# Patient Record
Sex: Male | Born: 1975
Health system: Southern US, Community
[De-identification: ages and names within clinical notes are randomized; demographics above are authoritative.]

## PROBLEM LIST (undated history)

## (undated) ENCOUNTER — Emergency Department: Payer: 59 | Source: Home / Self Care

## (undated) DIAGNOSIS — G4730 Sleep apnea, unspecified: Secondary | ICD-10-CM

## (undated) DIAGNOSIS — E079 Disorder of thyroid, unspecified: Secondary | ICD-10-CM

## (undated) DIAGNOSIS — D649 Anemia, unspecified: Secondary | ICD-10-CM

## (undated) DIAGNOSIS — F200 Paranoid schizophrenia: Secondary | ICD-10-CM

## (undated) DIAGNOSIS — F609 Personality disorder, unspecified: Secondary | ICD-10-CM

## (undated) DIAGNOSIS — J45909 Unspecified asthma, uncomplicated: Secondary | ICD-10-CM

## (undated) HISTORY — PX: FRACTURE SURGERY: SHX138

---

## 2000-04-16 ENCOUNTER — Inpatient Hospital Stay (HOSPITAL_COMMUNITY): Admission: EM | Admit: 2000-04-16 | Discharge: 2000-05-02 | Payer: Self-pay | Admitting: *Deleted

## 2000-06-29 ENCOUNTER — Inpatient Hospital Stay (HOSPITAL_COMMUNITY): Admission: AD | Admit: 2000-06-29 | Discharge: 2000-07-08 | Payer: Self-pay | Admitting: Psychiatry

## 2000-10-08 ENCOUNTER — Emergency Department (HOSPITAL_COMMUNITY): Admission: EM | Admit: 2000-10-08 | Discharge: 2000-10-08 | Payer: Self-pay | Admitting: Emergency Medicine

## 2001-12-18 ENCOUNTER — Emergency Department (HOSPITAL_COMMUNITY): Admission: EM | Admit: 2001-12-18 | Discharge: 2001-12-18 | Payer: Self-pay | Admitting: Emergency Medicine

## 2001-12-18 ENCOUNTER — Encounter: Payer: Self-pay | Admitting: Emergency Medicine

## 2001-12-29 ENCOUNTER — Emergency Department (HOSPITAL_COMMUNITY): Admission: EM | Admit: 2001-12-29 | Discharge: 2001-12-29 | Payer: Self-pay | Admitting: Emergency Medicine

## 2001-12-29 ENCOUNTER — Encounter: Payer: Self-pay | Admitting: Emergency Medicine

## 2004-05-15 ENCOUNTER — Ambulatory Visit: Payer: Self-pay | Admitting: Internal Medicine

## 2004-10-04 ENCOUNTER — Ambulatory Visit: Payer: Self-pay | Admitting: Internal Medicine

## 2005-01-03 ENCOUNTER — Ambulatory Visit: Payer: Self-pay | Admitting: Internal Medicine

## 2005-06-22 ENCOUNTER — Ambulatory Visit: Payer: Self-pay | Admitting: Family Medicine

## 2005-10-28 ENCOUNTER — Ambulatory Visit: Payer: Self-pay | Admitting: *Deleted

## 2005-10-28 ENCOUNTER — Inpatient Hospital Stay (HOSPITAL_COMMUNITY): Admission: AD | Admit: 2005-10-28 | Discharge: 2005-11-02 | Payer: Self-pay | Admitting: *Deleted

## 2005-12-20 ENCOUNTER — Ambulatory Visit: Payer: Self-pay | Admitting: Pulmonary Disease

## 2006-01-31 ENCOUNTER — Ambulatory Visit: Payer: Self-pay | Admitting: Internal Medicine

## 2006-01-31 ENCOUNTER — Ambulatory Visit (HOSPITAL_BASED_OUTPATIENT_CLINIC_OR_DEPARTMENT_OTHER): Admission: RE | Admit: 2006-01-31 | Discharge: 2006-01-31 | Payer: Self-pay | Admitting: Pulmonary Disease

## 2006-01-31 ENCOUNTER — Ambulatory Visit: Payer: Self-pay | Admitting: Pulmonary Disease

## 2006-03-18 ENCOUNTER — Ambulatory Visit: Payer: Self-pay | Admitting: Pulmonary Disease

## 2006-08-10 ENCOUNTER — Inpatient Hospital Stay (HOSPITAL_COMMUNITY): Admission: AD | Admit: 2006-08-10 | Discharge: 2006-08-16 | Payer: Self-pay | Admitting: *Deleted

## 2006-08-11 ENCOUNTER — Ambulatory Visit: Payer: Self-pay | Admitting: Psychiatry

## 2006-12-25 ENCOUNTER — Emergency Department (HOSPITAL_COMMUNITY): Admission: EM | Admit: 2006-12-25 | Discharge: 2006-12-25 | Payer: Self-pay | Admitting: Emergency Medicine

## 2007-04-22 ENCOUNTER — Encounter: Payer: Self-pay | Admitting: Pulmonary Disease

## 2007-07-28 ENCOUNTER — Inpatient Hospital Stay (HOSPITAL_COMMUNITY): Admission: AD | Admit: 2007-07-28 | Discharge: 2007-08-04 | Payer: Self-pay | Admitting: Psychiatry

## 2007-07-28 ENCOUNTER — Ambulatory Visit: Payer: Self-pay | Admitting: Psychiatry

## 2007-08-14 ENCOUNTER — Inpatient Hospital Stay (HOSPITAL_COMMUNITY): Admission: RE | Admit: 2007-08-14 | Discharge: 2007-08-19 | Payer: Self-pay | Admitting: Psychiatry

## 2008-09-08 ENCOUNTER — Inpatient Hospital Stay (HOSPITAL_COMMUNITY): Admission: RE | Admit: 2008-09-08 | Discharge: 2008-09-15 | Payer: Self-pay | Admitting: Psychiatry

## 2008-09-08 ENCOUNTER — Ambulatory Visit: Payer: Self-pay | Admitting: Psychiatry

## 2008-09-29 ENCOUNTER — Ambulatory Visit: Payer: Self-pay | Admitting: Pulmonary Disease

## 2008-11-15 ENCOUNTER — Ambulatory Visit (HOSPITAL_BASED_OUTPATIENT_CLINIC_OR_DEPARTMENT_OTHER): Admission: RE | Admit: 2008-11-15 | Discharge: 2008-11-15 | Payer: Self-pay | Admitting: Pulmonary Disease

## 2008-11-15 ENCOUNTER — Encounter: Payer: Self-pay | Admitting: Pulmonary Disease

## 2008-12-05 ENCOUNTER — Ambulatory Visit: Payer: Self-pay | Admitting: Pulmonary Disease

## 2008-12-06 ENCOUNTER — Encounter: Payer: Self-pay | Admitting: Pulmonary Disease

## 2009-01-08 ENCOUNTER — Emergency Department (HOSPITAL_COMMUNITY): Admission: EM | Admit: 2009-01-08 | Discharge: 2009-01-08 | Payer: Self-pay | Admitting: Emergency Medicine

## 2009-02-15 ENCOUNTER — Ambulatory Visit: Payer: Self-pay | Admitting: Psychiatry

## 2009-02-15 ENCOUNTER — Other Ambulatory Visit: Payer: Self-pay | Admitting: Emergency Medicine

## 2009-02-16 ENCOUNTER — Inpatient Hospital Stay (HOSPITAL_COMMUNITY): Admission: AD | Admit: 2009-02-16 | Discharge: 2009-03-08 | Payer: Self-pay | Admitting: Psychiatry

## 2010-03-19 LAB — GLUCOSE, CAPILLARY: Glucose-Capillary: 133 mg/dL — ABNORMAL HIGH (ref 70–99)

## 2010-03-22 LAB — GLUCOSE, CAPILLARY
Glucose-Capillary: 100 mg/dL — ABNORMAL HIGH (ref 70–99)
Glucose-Capillary: 107 mg/dL — ABNORMAL HIGH (ref 70–99)
Glucose-Capillary: 110 mg/dL — ABNORMAL HIGH (ref 70–99)
Glucose-Capillary: 111 mg/dL — ABNORMAL HIGH (ref 70–99)
Glucose-Capillary: 112 mg/dL — ABNORMAL HIGH (ref 70–99)
Glucose-Capillary: 116 mg/dL — ABNORMAL HIGH (ref 70–99)
Glucose-Capillary: 117 mg/dL — ABNORMAL HIGH (ref 70–99)
Glucose-Capillary: 117 mg/dL — ABNORMAL HIGH (ref 70–99)
Glucose-Capillary: 119 mg/dL — ABNORMAL HIGH (ref 70–99)
Glucose-Capillary: 119 mg/dL — ABNORMAL HIGH (ref 70–99)
Glucose-Capillary: 122 mg/dL — ABNORMAL HIGH (ref 70–99)
Glucose-Capillary: 128 mg/dL — ABNORMAL HIGH (ref 70–99)
Glucose-Capillary: 133 mg/dL — ABNORMAL HIGH (ref 70–99)
Glucose-Capillary: 145 mg/dL — ABNORMAL HIGH (ref 70–99)
Glucose-Capillary: 165 mg/dL — ABNORMAL HIGH (ref 70–99)

## 2010-03-22 LAB — BASIC METABOLIC PANEL
BUN: 8 mg/dL (ref 6–23)
Chloride: 102 mEq/L (ref 96–112)
Creatinine, Ser: 1.06 mg/dL (ref 0.4–1.5)
GFR calc Af Amer: >60 mL/min (ref >60)
GFR calc non Af Amer: >60 mL/min (ref >60)

## 2010-03-22 LAB — URINALYSIS, ROUTINE W REFLEX MICROSCOPIC
Glucose, UA: NEGATIVE mg/dL
Hgb urine dipstick: NEGATIVE
Ketones, ur: NEGATIVE mg/dL
Protein, ur: NEGATIVE mg/dL
pH: 6.5 (ref 5.0–8.0)

## 2010-03-22 LAB — CBC
MCV: 84.1 fL (ref 78.0–100.0)
Platelets: 223 10*3/uL (ref 150–400)
RBC: 4.86 MIL/uL (ref 4.22–5.81)
WBC: 9.5 10*3/uL (ref 4.0–10.5)

## 2010-03-22 LAB — DIFFERENTIAL
Lymphocytes Relative: 16 % (ref 12–46)
Lymphs Abs: 1.5 10*3/uL (ref 0.7–4.0)
Monocytes Relative: 7 % (ref 3–12)
Neutro Abs: 7.3 10*3/uL (ref 1.7–7.7)
Neutrophils Relative %: 77 % (ref 43–77)

## 2010-03-22 LAB — RAPID URINE DRUG SCREEN, HOSP PERFORMED
Barbiturates: NOT DETECTED
Benzodiazepines: NOT DETECTED
Cocaine: NOT DETECTED

## 2010-03-22 LAB — URINE MICROSCOPIC-ADD ON

## 2010-03-22 LAB — ETHANOL: Alcohol, Ethyl (B): <5 mg/dL (ref 0–10)

## 2010-03-24 LAB — BASIC METABOLIC PANEL
BUN: 8 mg/dL (ref 6–23)
Chloride: 102 mEq/L (ref 96–112)
Creatinine, Ser: 0.97 mg/dL (ref 0.4–1.5)
GFR calc Af Amer: >60 mL/min (ref >60)
GFR calc non Af Amer: >60 mL/min (ref >60)
Potassium: 4.2 mEq/L (ref 3.5–5.1)

## 2010-03-24 LAB — CBC
HCT: 40.3 % (ref 39.0–52.0)
Platelets: 241 10*3/uL (ref 150–400)
RBC: 4.76 MIL/uL (ref 4.22–5.81)
WBC: 7.3 10*3/uL (ref 4.0–10.5)

## 2010-03-24 LAB — GLUCOSE, CAPILLARY
Glucose-Capillary: 100 mg/dL — ABNORMAL HIGH (ref 70–99)
Glucose-Capillary: 103 mg/dL — ABNORMAL HIGH (ref 70–99)
Glucose-Capillary: 110 mg/dL — ABNORMAL HIGH (ref 70–99)
Glucose-Capillary: 111 mg/dL — ABNORMAL HIGH (ref 70–99)
Glucose-Capillary: 119 mg/dL — ABNORMAL HIGH (ref 70–99)
Glucose-Capillary: 121 mg/dL — ABNORMAL HIGH (ref 70–99)
Glucose-Capillary: 122 mg/dL — ABNORMAL HIGH (ref 70–99)
Glucose-Capillary: 125 mg/dL — ABNORMAL HIGH (ref 70–99)
Glucose-Capillary: 98 mg/dL (ref 70–99)

## 2010-03-24 LAB — LITHIUM LEVEL: Lithium Lvl: 1.16 mEq/L (ref 0.80–1.40)

## 2010-04-07 LAB — LITHIUM LEVEL: Lithium Lvl: 0.82 mEq/L (ref 0.80–1.40)

## 2010-04-07 LAB — GLUCOSE, CAPILLARY
Glucose-Capillary: 100 mg/dL — ABNORMAL HIGH (ref 70–99)
Glucose-Capillary: 106 mg/dL — ABNORMAL HIGH (ref 70–99)
Glucose-Capillary: 108 mg/dL — ABNORMAL HIGH (ref 70–99)
Glucose-Capillary: 130 mg/dL — ABNORMAL HIGH (ref 70–99)
Glucose-Capillary: 136 mg/dL — ABNORMAL HIGH (ref 70–99)
Glucose-Capillary: 143 mg/dL — ABNORMAL HIGH (ref 70–99)
Glucose-Capillary: 145 mg/dL — ABNORMAL HIGH (ref 70–99)
Glucose-Capillary: 154 mg/dL — ABNORMAL HIGH (ref 70–99)

## 2010-04-07 LAB — COMPREHENSIVE METABOLIC PANEL
ALT: 14 U/L (ref 0–53)
Alkaline Phosphatase: 61 U/L (ref 39–117)
BUN: 5 mg/dL — ABNORMAL LOW (ref 6–23)
CO2: 34 mEq/L — ABNORMAL HIGH (ref 19–32)
GFR calc non Af Amer: >60 mL/min (ref >60)
Glucose, Bld: 89 mg/dL (ref 70–99)
Potassium: 4 mEq/L (ref 3.5–5.1)
Sodium: 139 mEq/L (ref 135–145)
Total Bilirubin: 0.6 mg/dL (ref 0.3–1.2)

## 2010-04-07 LAB — URINALYSIS, ROUTINE W REFLEX MICROSCOPIC
Bilirubin Urine: NEGATIVE
Hgb urine dipstick: NEGATIVE
Specific Gravity, Urine: 1.012 (ref 1.005–1.030)
Urobilinogen, UA: 0.2 mg/dL (ref 0.0–1.0)
pH: 6.5 (ref 5.0–8.0)

## 2010-04-07 LAB — CBC
HCT: 38.3 % — ABNORMAL LOW (ref 39.0–52.0)
Hemoglobin: 12.4 g/dL — ABNORMAL LOW (ref 13.0–17.0)
MCHC: 32.7 g/dL (ref 30.0–36.0)
MCV: 85.7 fL (ref 78.0–100.0)
Platelets: 213 10*3/uL (ref 150–400)
RBC: 4.47 MIL/uL (ref 4.22–5.81)
WBC: 7.5 10*3/uL (ref 4.0–10.5)

## 2010-04-07 LAB — DIFFERENTIAL
Basophils Absolute: 0 10*3/uL (ref 0.0–0.1)
Basophils Relative: 0 % (ref 0–1)
Basophils Relative: 1 % (ref 0–1)
Eosinophils Absolute: 0.3 10*3/uL (ref 0.0–0.7)
Eosinophils Absolute: 0.3 10*3/uL (ref 0.0–0.7)
Eosinophils Relative: 4 % (ref 0–5)
Lymphs Abs: 1.9 10*3/uL (ref 0.7–4.0)
Neutro Abs: 4.4 10*3/uL (ref 1.7–7.7)
Neutrophils Relative %: 59 % (ref 43–77)
Neutrophils Relative %: 63 % (ref 43–77)

## 2010-05-16 NOTE — H&P (Signed)
John [name]                 ACCOUNT NO.:  [id number]   MEDICAL RECORD NO.:  [id number]          PATIENT TYPE:  IPS   LOCATION:  0406                          FACILITY:  [facility]   PHYSICIAN:  John Jungling, MD  DATE OF BIRTH:  June 03, 1975   DATE OF ADMISSION:  07/28/2007  DATE OF DISCHARGE:                       PSYCHIATRIC ADMISSION ASSESSMENT   IDENTIFICATION:  A 35 year old male involuntary committed July 28, 2007.   HISTORY OF PRESENT ILLNESS:  The patient is here on petition with papers  stating that the patient has a long history of mental disorder,  delusional thinking, hallucinations uncontrolled, suicidal obsessions,  instigating conflicts with people in public and in group home, and is  considered a danger to himself and others.  The patient is endorsing  auditory hallucinations telling him to hurt himself.  He denies any  specific stressors, although he does report new residents in the group  home have caused some distress for him.  He denies any substance use.   PAST PSYCHIATRIC HISTORY:  The patient was here in September 2008.  He  is with the ACT Team and sees Dr. Electa Sniff.   SOCIAL HISTORY:  He is a 35 year old single male who lives at [facility] and has been there for some period of time.  He states his  mother is supportive.   FAMILY HISTORY:  Unknown.   ALCOHOL/DRUG HISTORY:  None.   PRIMARY CARE [place]:  Unclear.   MEDICAL PROBLEMS:  No known medical conditions.   MEDICATIONS:  1. Risperdal Consta with 50 mg every 2 weeks with injection of last      week.  Will clarify last injection.  2. Trilafon 8 mg at bedtime.  3. Depakote ER 1000 mg q.h.s.   DRUG ALLERGIES:  NO KNOWN ALLERGIES.   REVIEW OF SYSTEMS:  Significant for insomnia, chest pain.  No associated  coronary artery disease.  No shortness of breath.  No appetite changes.  No headaches.  No seizures.  No falls.   PHYSICAL EXAMINATION:  VITAL SIGNS:  Temperature is 97.8, 69 heart  rate,  20 respirations, blood pressure is 129/83.  He is 6 feet 2 inches tall,  336 pounds.  GENERAL APPEARANCE:  This is an obese young male who was in no acute  distress.  Offers no complaints.  HEENT:  Head is atraumatic.  NECK:  Negative lymphadenopathy.  CHEST:  Clear.  HEART:  Regular rate and rhythm.  No murmurs, gallops or rubs.  ABDOMEN:  Soft, nondistended abdomen.  Positive bowel sounds.  PELVIC/GU:  Deferred.  EXTREMITIES:  The patient moves all of his extremities.  There is no  clubbing, no  edema, 5+ against resistance.  SKIN:  Warm and dry.  There is some dryness noted to his right hand.  NEUROLOGIC:  Findings are intact.  No cogwheeling.  No stiffness.  No  tremors.   LABORATORY DATA:  Albumin of 2.9, hemoglobin of 12.4, hematocrit 37.8,  TSH and Depakote level are pending.   MENTAL STATUS EXAM:  The patient is resting at this time in the bed.  He  cooperates readily, fully alert, calm.  Good eye contact.  Speech is  clear.  Mood is neutral.  The patient's affect is friendly.  No overt  delusional statements.  Thought process, endorsing auditory  hallucinations, but denies any currently.  No suicidal thoughts.  Cognitive function intact.  His memory is good.  Judgment is fair.  Insight is fair.   AXIS I:  Schizoaffective disorder.  AXIS II:  Deferred.  AXIS III:  No known medical conditions.  AXIS IV:  Other psychosocial problems related to burden of illness.  AXIS V:  Current is 30.   PLAN:  Stabilize mood and thinking.  We will resume his medications.  We  will clarify the patient's last injection.  Reinforce medication  compliance.  Continue to assess comorbidities.  The patient is to follow  up with the ACT Team.  Case manager will assess returning to prior  living arrangement.  Tentative length of stay is 3-6 days.      John [name], N.P.      John Jungling, MD  Electronically Signed    JO/MEDQ  D:  07/29/2007  T:  07/29/2007  Job:   [id number]

## 2010-05-16 NOTE — Discharge Summary (Signed)
NAMETYDUS, SANMIGUEL NO.:  [id number]   MEDICAL RECORD NO.:  [id number]          PATIENT TYPE:  IPS   LOCATION:  0406                          FACILITY:  [facility]   PHYSICIAN:  Anselm Jungling, MD  DATE OF BIRTH:  10/15/1975   DATE OF ADMISSION:  07/28/2007  DATE OF DISCHARGE:  08/04/2007                               DISCHARGE SUMMARY   IDENTIFYING DATA AND REASON FOR ADMISSION:  The patient is a 35 year old  single African American male, a client of the [employer], who had been living at the [facility].  He was  admitted due to exacerbation of his symptoms of schizoaffective  disorder.  Please refer to the admission note for further details  pertaining to the symptoms, circumstances and history that led to his  hospitalization.  He was given an initial Axis I diagnosis of  schizoaffective disorder, currently manic with psychotic features.   MEDICAL AND LABORATORY:  The patient, aside from being very obese, was  in good health without any active or chronic medical problems.   HOSPITAL COURSE:  The patient was admitted to the adult inpatient  psychiatric service.  He presented as an obese male who was generally  affable, pleasant and cooperative and mildly euphoric.  He made no  overtly delusional statements.  He had admitted to suicidal ideation,  but was able to contract for safety.  He was agreeable to the need for  inpatient hospitalization and medication stabilization.   Prior to admission, he had some racing thoughts, irritability, and had  instigating conflicts with strangers in public, as well as other  residents of his group home.  He had been doing inappropriate touching  of others in public.   The patient was continued on a regimen of Risperdal Consta 50 mg IM q.2  weeks.  He was continued on Depakote ER and Trilafon was initiated at  that time to further address his need for medication stabilization.   He generally  continued pleasant and cooperative.  He worked with case  management towards a plan to return to the [facility].   On the seventh hospital day, he appeared appropriate for discharge back  to his group home.  He agreed to the following aftercare plan.   AFTERCARE:  The patient was to follow-up with the [facility], and was to be seen by them on the day after discharge,  August 05, 2007.   DISCHARGE MEDICATIONS:  1. Trilafon 8 mg nightly.  2. Depakote ER 1000 mg nightly.  3. Risperdal Consta 50 mg IM, q.2 weeks, last received on August 03, 2007, next due on August 17, 2007.   DISCHARGE DIAGNOSES:  AXIS I:  Schizoaffective disorder, most recently  manic with psychotic features, resolving.  AXIS II:  Deferred.  AXIS III:  Obesity.  AXIS IV:  Stressors severe.  AXIS V:  Global Assessment of Functioning on discharge 50.      Anselm Jungling, MD  Electronically Signed     SPB/MEDQ  D:  08/19/2007  T:  08/19/2007  Job:  [id number]

## 2010-05-16 NOTE — H&P (Signed)
NAMECAROLE, DEERE                 ACCOUNT NO.:  [id number]   MEDICAL RECORD NO.:  [id number]          PATIENT TYPE:  IPS   LOCATION:  0401                          FACILITY:  [facility]   PHYSICIAN:  Anselm Jungling, MD  DATE OF BIRTH:  06-Sep-1975   DATE OF ADMISSION:  08/14/2007  DATE OF DISCHARGE:                       PSYCHIATRIC ADMISSION ASSESSMENT   IDENTIFYING INFORMATION:  This is a 35 year old male that was  voluntarily admitted on August 14, 2007.   HISTORY OF PRESENT ILLNESS:  The patient presents with a history of  delusions, increasing psychotic symptoms, having some bizarre thinking  and behavior with impulsive acts, sitting down in the middle of the road  at night.  Denying any suicidal thoughts.  The patient was recently in  our facility and discharged with medications which apparently he has  been compliant with.  No known substance use.   PAST PSYCHIATRIC HISTORY:  The patient was here at the end of July for  delusional thinking and hallucinations.  He is involved in the ACT team.   SOCIAL HISTORY:  A 35 year old male who lives in [facility].  He has a mother who is supportive.   FAMILY HISTORY:  None that we are aware of.   ALCOHOL AND DRUG HISTORY:  Nonsmoker.  Denies any alcohol or drug use.   PRIMARY CARE [facility]:  Is unclear.   MEDICAL PROBLEMS:  There are no known medical conditions.   MEDICATIONS:  1. The patient was discharged on Trilafon 8 mg at bedtime.  2. Depakote ER 1000 mg.  3. Risperdal Consta 50 mg IM receiving injection every 2 weeks.   DRUG ALLERGIES:  No known allergies.   PHYSICAL EXAM:  GENERAL:  This is a tall, obese male who is in no acute  distress.  VITAL SIGNS:  His temperature is 97.3 89 heart rate, 20 respirations,  blood pressure is 129/85, 6 feet 4 inches tall, 324 pounds.   REVIEW OF SYSTEMS:  Significant for bizarre behavior.  No psychotic  symptoms.  No other problems with fever, chills, chest pain, shortness  of  breath, nausea, vomiting, diarrhea, headaches, seizures or any muscle  or joint pain.   GENERAL APPEARANCE:  This is a tall, disheveled, overweight obese male  in no acute distress.  HEENT:  Head is atraumatic.  He has poor dental hygiene.  NECK:  Neck is supple.  CHEST:  Chest is clear.  HEART:  Regular rate and rhythm.  Abdomen:  Abdomen is protuberant and soft abdomen, nontender.  PELVIC:  Deferred.  GU:  Deferred.  EXTREMITIES:  The patient moves all extremities.  No clubbing, edema or  deformities.  SKIN:  Is warm and dry and intact.  No obvious abrasions or lacerations  noted.  NEUROLOGIC:  Findings are intact.  No tics or tremors.  No further labs  were drawn as the patient recently had recent admission.  We will,  however, check a Depakote level in the morning.   MENTAL STATUS EXAM:  This is a fully alert male sitting on the edge of  the bed who initially stated that  he didn't want to talk right now  then as the day progressed the patient was a little more agreeable to  providing minimal history.  He is cooperative, pleasant but somewhat  disorganized, smiling, thoughts again are somewhat disorganized, was  endorsing auditory hallucinations and does not appear to be actively  responding to internal stimuli.  He appears to be well aware of self and  situation and place.   AXIS I:  Schizophrenia, paranoid type.  AXIS II:  Deferred.  AXIS III:  No known medical conditions.  AXIS IV:  Other psychosocial problems related to burden of illness.  AXIS V:  Current is 30.   PLAN:  Is to contract for safety.  Stabilize mood thinking.  The patient  will be placed on the intensive unit for close monitoring.  We will  increase his Trilafon to 8 mg b.i.d. and 32 mg at bedtime.  We will  check a Depakote level.  Continue to gather more history and confer with  the ACT team to get further insight as to the patient's progress and  stressors.  The case manager will see the patient who  will be returning  to [facility].  His tentative length of stay at this time is 5-  7 days.      Landry Corporal, N.P.      Anselm Jungling, MD  Electronically Signed    JO/MEDQ  D:  08/15/2007  T:  08/15/2007  Job:  [id number]
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
## 2010-05-19 NOTE — H&P (Signed)
[facility]  Patient:    John [name], John [name]                     MRN: [id number] Adm. Date:  [id number] Attending:  Rachael Fee Dictator:   Young Berry Lorin Picket, N.P.                   Psychiatric Admission Assessment  DATE OF ASSESSMENT:  June 30, 2000, at 8 a.m.  IDENTIFYING INFORMATION:  This is a 35 year old African-American male who is involuntary admission for acute psychosis, threatening to kill himself, and threatening to kill his mother stating "I will kill her".  HISTORY OF PRESENT ILLNESS:  The patient was petitioned for involuntary commitment by his mother, who reports that he has been responding to auditory hallucinations for the last week or two, saying he would have to kill himself and making gestures about cutting his own wrists.  She reports that his behavior has deteriorated recently with his table manners becoming juvenile and patient at one point last week spontaneously got in the car and drove himself to [place].  He has also begun making some bizarre statements to her such as "the cars out there are out for blood", when they were riding in the car.  The patients mother reports that the patients condition continues to deteriorate in spite of Dr. Magdalen Spatz increases of his Seroquel medication recently to 200 mg q. h.s.  The mother reports that she is having increasing problems caring for the patient at home and questions his medication compliance, that he is possibly cheeking his medications and not swallowing them when it is administered.  On the admission to the unit, the patient made statements saying that he is "the father, the son, and the holy ghost" and became mildly agitated.  He was treated with Haldol 5 mg and Ativan 2 mg and is considerably calmer this morning.  Today, he denies hearing any voices or seeing any shadows or visions, however, he still does appear to be responding to internal stimuli.  PAST PSYCHIATRIC HISTORY:   The patient has been followed by Dr. Evelene Croon since April 2002.  Previously, he was followed by [facility]. His last inpatient admission was at [facility] in April 2002, when he was discharged on Haldol-D, however, he has not been taking that.  He has also been treated previously on ______ and Zyprexa, both which were discontinued in the past secondary to side effects.  The patient has more recently been placed on Seroquel 200 mg, which is a recently increased dose by Dr. Evelene Croon and he apparently has not been on the Haldol for sometime.  SOCIAL HISTORY:  The patient currently lives with his mother, who is his primary caregiver.  He previously did attend school, however, he has not attended school in quite some time.  His mother is very supportive and attentive to his care.  She is requesting information about obtaining power of attorney for him and about her options for long-term care, stating that she finds it increasingly difficult to provide adequate care for him at home.  FAMILY HISTORY:  Unclear at this time, but there is apparently none reported.  ALCOHOL AND DRUG HISTORY:  Somewhat unclear.  His urine drug screen is pending, however, we do not suspect any substance abuse at this time.  MEDICAL HISTORY:  The patient is followed by Dr. Anne Hahn, a neurologist, for a possible history of seizure disorder.  According to his history, he also has a previous diagnosis of a benign cyst in his brain which was identified on CT scan this spring and Dr. Anne Hahn has stated there is no cause of concern.  The patient also has a history of pruritic dermatitis between his fingers on both hands.  MEDICATIONS: 1. Seroquel 200 mg p.o. q. h.s., per Dr. Evelene Croon.  This is a recently increased    dose. 2. He has previously been on hydrocortisone 1% for his eczema to his hands.    We are unclear of what cream he currently uses.  DRUG ALLERGIES:  No known drug  allergies.  POSITIVE PHYSICAL FINDINGS:  GENERAL:  The patient is a tall, large built, black male who is very healthy in appearance.  We do not have a current weight or height on him.  He is in no acute distress at this time.  VITAL SIGNS:  On admission, temperature 98.5, pulse 122, respirations 24, blood pressure 144/96.  These vital signs were taken while he was still a little bit agitated and before he got his Haldol.  SKIN:  Dark in tone.  Hair is clipped short.  Skin is generally in good condition.  Hygiene is good.  The patient does have erythematous areas between his fingers on both his right and his left hand with very small scattered vesicles.  He states that it itches.  When he is asked about this, however, it does not appear that he has been scratching it.  He states that this does not disrupt his sleep in any way.  No lichenification present.  The skin is generally smooth and in good condition.  HEENT:  HEAD: Normocephalic, without lesions.  MOUTH: Oral mucosa is pink and moist.  Tongue is midline without fasciculations.  Uvula is midline.  Oral hygiene is good.  No breath odor.  NECK:  Supple, without thyromegaly.  CARDIOVASCULAR:  S1 and S2 heard.  No murmurs, clicks, or gallops.  LUNGS:  Clear to auscultation.  ABDOMEN:  No masses or tenderness.  GENITALIA:  Deferred.  MUSCULOSKELETAL:  Patients strength is 5/5 throughout.  Motor movements are generally smooth, without tremor.  The patient does show psychomotor retardation and some limited stride with his gait, however, he does not really show any EPS or any rigidity at this time.  No cogwheeling is evident. Sensory appears grossly intact.  No erythema is evident in joints.  The patient has no subjective complaints of any joint pain.  NEUROLOGICAL:  Romberg is without findings.  Motor movements are smooth without tremor.  Tongue shows no fasciculations.  Cogwheeling is negative.  LABORATORY DATA:  At this  time, the patients CBC is within normal limits. His thyroid panel and metabolic panel are still pending, along with his urinalysis and urine drug screen.   MENTAL STATUS EXAMINATION:  The patient is a tall, large built, black male, generally well-nourished and well-developed.  He is calm and cooperative for the exam.  He does have a blunted affect, with an incongruent smile that is incongruent with the anger he describes with his mother because she would not take him to the mall.  He does have small mild psychomotor retardation.  His speech shows mild pressure and some latency.  It is not spontaneous.  He does offer short, but fairly appropriate answers to questions.  His thinking appears to have cleared considerably since yesterday.  His mood is fairly euthymic.  Thought process reveals that the patient does appear to be responding  to some internal stimuli.  Cognitively, he is oriented x 3 and is intact.  ADMISSION DIAGNOSES: Axis I:    Schizophrenia, NOS. Axis II:   Deferred. Axis III:  1. Benign brain cyst.            2. Dermatitis of both hands. Axis IV:   Moderate problems related to care at home.  His mother will need            assistance with obtaining information about power of attorney and            possible care options for long-term needs. Axis V:    Current 30 and past year 60.  INITIAL PLAN OF CARE:  Admit the patient to stabilize his thinking and allow for decrease in psychotic symptoms and safe functioning in the community.  We will consider and evaluate options for long-term care for him or assist his mother in obtaining some more information for that.  We will ask the case manager to help her with that.  We will start him on Haldol 5 mg p.o. b.i.d. since he responded well to this in the past and we will give him Cogentin 1 mg b.i.d.  We will also start him on some hydrocortisone cream to his hands.  ESTIMATED LENGTH OF STAY:  His estimated length of stay is 2-4  days. DD:  06/30/00 TD:  06/30/00 Job: [id number]

## 2010-05-19 NOTE — Discharge Summary (Signed)
[facility]  Patient:    John [name], John [name]                     MRN: [id number] Adm. Date:  [id number] Disc. Date: [id number] Attending:  Jasmine Pang CC:         [facility]   Discharge Summary  REASON FOR ADMISSION:  Patient was an almost 35 year old African-American male who was committed by me secondary to catatonic psychosis and inability to function.  He has a history of a psychotic disorder, possibly schizophrenia. His mother reports he has become increasingly catatonic, with disorganized thinking, inability to function, mutism, bizarre affect, thought blocking, and unusual speech.  His mother reports a continued decompensation during the past several weeks, and he is now unable to perform simple ADLs.  Patient has been seen at [facility].  He was on Geodon 40 mg b.i.d. This was discontinued due to side effects.  Zyprexa also caused sedation.  He has been hospitalized at [facility] November 2000.  He had been seen by Milagros Evener, M.D. for medication management x 1.  Patient was also seen by neurologist, Dr. Anne Hahn.  A CT scan revealed a benign "cyst" on the brain according to his mother and the CT report.  Dr. Anne Hahn did not feel this was significant.  He will return to see Dr. Anne Hahn after discharge.  He is on no medications.  He has no known drug allergies.  PHYSICAL EXAMINATION:  This was done by Ottis Stain, P.A.  He had a history of urinary incontinence.  He had of the brain cyst as indicated in the history of present illness which was thought to be benign by his neurologist.  LABS:  CBC was within normal limits.  Hepatic function panel was within normal limits except for a slightly elevated total protein of 9.2 (6-8).  T4, TSH and T3 uptake was within normal limits.  Urinalysis was within normal limits.  HOSPITAL COURSE:  Patient was started on Seroquel 50 mg p.o.  b.i.d. x 1 day, then 100 mg p.o. b.i.d. x 1 day, then 150 mg b.i.d. x 1 day, then 200 mg p.o. b.i.d.  On April 23, 2000, the Seroquel was increased to 200 mg p.o. t.i.d. He was quite sedate and this concerned his mother, but she was assured that the sedation would resolve, and this was starting to occur.  However, after talking with mother it became evident that he refuses to take his medications at home, even if she is trying to administer them.  We discussed with her the possibility of starting Haldol Decanoate so that he would only have a monthly shot, instead of a twice a day dosing.  She was in agreement.  We began to titrate down on the Seroquel gradually and began Haldol 2.5 mg p.o. q.h.s. x 1 day, then Haldol 2.5 mg p.o. b.i.d., then Haldol 2.5 mg p.o. t.i.d., then Haldol 5 mg p.o. b.i.d., then Haldol 5 mg p.o. t.i.d.  Patient tolerated this change well, without a decompensation in psychosis.  He also was able to wean off the Seroquel without a problem.  On April 24, 2000, he was begun on Claritin 10 mg q.d. x 5 days for upper respiratory stuffiness, possibly seasonal allergies.  He was also begun on Ocean nasal spray t.i.d. p.r.n. nasal congestion or discomfort.  On addition, he was placed on hydrocortisone cream 1% to bilateral hands b.i.d. due to some  eczema-type problems he had. Meanwhile his mother was going to find out the name of the actual cream prescribed by the dermatologist.  On April 26, 2000, he was placed on Cogentin 2 mg daily.  On April 29, 2000, due to a decrease in his appetite and a weight loss of 9 pounds, he was started on Zyprexa 5 mg p.o. q.h.s.  However, he was noted to eat well when his mother brought food from [employer] or [employer] or other fast food restaurants for him.  It was felt that he would eat okay once discharged home to his mother.  On April 30, 2000, since he had tolerated the Haldol without side effects and had responded well to it, he was  begun on Haldol Decanoate 100 mg IM x 1.  He will need a repeat dose in one month. Haldol 5 mg p.o. t.i.d. was continued, then 5 mg p.o. b.i.d.  He was given a weeks supply to complete the b.i.d. dose and then discontinue all p.o. Haldol.  Patient became much less psychotic and catatonic.  He was able to get up and walk around and interact appropriately.  He attended groups and was able to participate at least some of the time.  He was better able to talk to me and became more spontaneous.  He was more talkative and spontaneous with his mother, both when she visited and over the phone.  He also spoke spontaneously with his father over the phone.  There was a significant improvement in his catatonic psychosis at the time of discharge and it was felt that he could be safely transitioned back to his mothers home for continued care.  Her only concern was that he not drive, which I agreed with. She has plans to use his car so that he will not have access to it.  DISCHARGE MENTAL STATUS EXAMINATION:  The patient presented with much better eye contact and less psychomotor retardation.  Speech was still non- spontaneous but normal rate and slow when he did talk.  He was no longer answering "yea, yea" or "nay, nay" repetitively when he did speak.  There was no suicidal or homicidal ideation.  Mood was somewhat anxious but overall euthymic.  Affect wide range, able to smile appropriately.  No self injurious behavior, no aggression.  Psychosis had cleared markedly.  He was still somewhat slowed, but there was no obvious evidence of catatonia, delusions or hallucinations.  Thought processes revealed some thought blocking but were more logical and goal directed.  Cognitive exam was still somewhat impaired due to his thought blocking.  Attention and concentration were poor.  Insight minimal, judgment poor.  DISCHARGE DIAGNOSES: Axis I:     Schizophrenia, catatonic type. Axis II:    None. Axis III:    Benign cyst on the brain on CT scan. Axis IV:    Severe. Axis V:     Current global assessment of function on admission 10.  Highest              past year 74.  Discharge global assessment of function 45.  DISCHARGE MEDICATIONS: 1. Haldol 5 mg p.o. b.i.d. x 1 week, then d/c. 2. Cogentin 2 mg p.o. b.i.d. 3. Claritin 10 mg q.d. p.r.n. seasonal allergies. 4. Ocean nasal spray 3 times daily p.r.n. nasal congestion. 5. Trazodone 50-100 mg q.h.s. for insomnia.  ACTIVITY LEVEL:  No restrictions.  DIET:  No restrictions.  POST HOSPITAL CARE PLAN:  Patient will return home to live with family. Follow  up therapy and medication management will be at the [facility]  He will also follow up at the [facility] due to his abnormal MRI and benign cyst on brain.  Patient will return home to live with his mother.  At this point, he is unable to return to school in his present condition.DD:  05/02/00 TD:  05/04/00 Job: [id number]

## 2010-05-19 NOTE — Discharge Summary (Signed)
[name], [name]                 ACCOUNT NO.:  [id number]   MEDICAL RECORD NO.:  [id number]          PATIENT TYPE:  IPS   LOCATION:  0305                          [facility]ACILITY[facility]  [facility]   PHYSICIAN:  Anselm Jungling, MD  DATE OF BIRTH:  1975/11/04   DATE OF ADMISSION:  08/10/2006  DATE OF DISCHARGE:  08/16/2006                               DISCHARGE SUMMARY   IDENTIFYING DATA AND REASON FOR ADMISSION:  This was an inpatient  psychiatric admission for John [name], a 35 year old single African American  male with a history of schizophrenia.  He was admitted due to increasing  paranoid delusions and agitation.  Please refer to the admission note  for further details pertaining to the symptoms, circumstances, and  history that led to his hospitalization.  He was given an initial Axis I  diagnosis of schizophrenia, chronic paranoid, acute exacerbation.   MEDICAL AND LABORATORY:  The patient was medically and physically  assessed by the psychiatric nurse practitioner.  His usual medical care  John [name] is Urgent Care.  He had been seen recently by Dr. Merla Riches.  He came to [place] with a history of sleep apnea, on CPAP.  He was status-  post surgery for fracture of his left hand.  He suffered from obesity,  and had been recently treated for sinusitis.  He was taking Optivar eye  drops, and amoxicillin 1000 mg b.i.d.   HOSPITAL COURSE:  The patient was admitted to the adult inpatient  psychiatric service.  He presented as an obese male who was alert, fully  oriented, pleasant, without irritability.  He stated that he was in our  facility for an evaluation, for my medications.  He admitted to  auditory hallucinations.  His mood was anxious.  There were no overt  indications of any thought disorder or delusionality.  He denied  suicidal ideation, and verbalized a strong desire for help.   The patient had been seeing Dr. Allyne Gee at [facility].  He came to [place] on a regimen of Risperdal,  and Trilafon, which  were continued.  He had also been taking reserpine.  Cogentin 2 mg twice  a day was provided as well.   The patient was involved in the therapeutic milieu.  He seemed to  respond rather rapidly to his medication regimen, reporting fewer  auditory hallucinations as he progressed.  He reported that he felt that  his medications were satisfactory.  He was not very interested in  participating in therapeutic groups and activities.   We were in contact with the patient's mother, who indicated that she was  very concerned about her son.  In the family session, both mother and  son indicated interest in a placement arrangement, but the mother said  that she wanted to evaluate any potential placement facility carefully.  The patient had been living with his mother, and they both agreed that  this would not be a satisfactory arrangement any further.   During the family meeting, the patient made various bizarre and  delusional statements regarding people traveling into his house via  light waves.   The patient continued in inpatient care and continued to receive  Risperdal and Trilafon at increased doses of 6 mg each at h.s.  He  remained calm and cooperative.   The patient's eating and sleeping were stable throughout his inpatient  stay.  His mother continued to be supportive to him.   The patient appeared to have reached a reasonably good level of baseline  functioning by the seventh hospital day.  At that time, he agreed to the  following aftercare plan, which was also supported by his mother.   AFTERCARE:  The patient was to follow up at [facility] with an appointment with their psychiatrist on August 20, 2006.   DISCHARGE MEDICATIONS:  1. Trilafon 4 mg q. a.m. and 8 mg q.h.s.  2. Risperdal 6 mg q.h.s.  3. Cogentin 2 mg b.i.d.  4. Ditropan XL 30 mg daily.  5. Optivar 0.05% ophthalmic solution, 1 drop, both eyes, twice daily.  6. Reserpine  0.5 mg b.i.d.  7. The patient was also recommended  to continue on Risperdal Consta,      50-mg injection every 2 weeks, next due on August 20, 2006, which      was also the date of his [facility] appointment.   He was instructed to follow up with family doctor regarding routine  medical problems.   DISCHARGE DIAGNOSES:  Axis I:  Schizophrenia, chronic paranoid, acute  exacerbation, resolving.  Axis II:  Deferred.  Axis III:  No acute illnesses.  Axis IV:  Stressors severe.  Axis V:  GAF on discharge 50.      Anselm Jungling, MD  Electronically Signed     SPB/MEDQ  D:  08/28/2006  T:  08/29/2006  Job:  [id number]

## 2010-05-19 NOTE — Discharge Summary (Signed)
NAMESEBASTION, JUN                 ACCOUNT NO.:  [id number]   MEDICAL RECORD NO.:  [id number]          PATIENT TYPE:  IPS   LOCATION:  0401                          FACILITY:  [facility]   PHYSICIAN:  Anselm Jungling, MD  DATE OF BIRTH:  [birth year]   DATE OF ADMISSION:  08/14/2007  DATE OF DISCHARGE:  08/19/2007                               DISCHARGE SUMMARY   IDENTIFYING DATA/REASON FOR ADMISSION:  This is one of several [facility]  admissions for Mamadou, a 35 year old single African American male, with a  history of schizoaffective disorder.  He was admitted due to  exacerbation of symptoms.  Please refer to the admission note for  further details pertaining to symptoms, circumstances and history that  led to his hospitalization.  He was given initial Axis I diagnosis of  schizoaffective disorder, most recently manic with psychotic features.   MEDICAL AND LABORATORY:  The patient was medically and physically  assessed by the psychiatric nurse practitioner.  Aside from being quite  obese, he was in good health without any active or chronic medical  problems.  There were no significant medical issues.   HOSPITAL COURSE:  The patient was admitted to the adult inpatient  psychiatric service.  He presented as a tall, large African American  male who was normally developed.  He was mildly euphoric, cheerful, and  affable in excess to what was appropriate to the situation.  Prior to  admission, he had delusional thinking, with bizarre behaviors in public,  such as sitting in the middle of a busy street at nighttime, after he  had wandered away from his group home.   The patient had recently been hospitalized in our facility several weeks  prior and was discharged on a regimen of Trilafon and Depakote, with  Risperdal Consta IM every 2 weeks.   During his hospital stay, he was continued on that regimen, but Trilafon  was increased stepwise significantly during his 5-day hospital stay, and  Depakote was increased as well to an ultimate level of 1500 mg nightly.  His Depakote level as of the fourth hospital day was 81.5.   By the fifth hospital day, the patient appeared to be much better with  respect to mood stability.  He was less euphoric, although still  pleasant, and non irritable.  His level of the insight and judgment was  still poor, but he appeared to be significantly improved to consider  returning him to his group home.   AFTERCARE:  As above.  The patient is a client of the [facility] and was to continue his follow-up with them  immediately upon his release.   DISCHARGE MEDICATIONS:  1. Trilafon 16 mg q. a.m. 32 mg nightly.  2. Depakote ER 1500 mg nightly.  3. Risperdal Consta 50 mg IM q. 2 weeks, next due September 03, 2007.   DISCHARGE DIAGNOSES:  AXIS I: Schizoaffective disorder, most recently  manic with psychotic features, resolving.  AXIS II: Deferred.  AXIS III: No acute or chronic illnesses.  AXIS IV: Stressors severe.  AXIS V: GAF on  discharge 50.      Anselm Jungling, MD  Electronically Signed     SPB/MEDQ  D:  08/26/2007  T:  08/26/2007  Job:  [id number]

## 2010-05-19 NOTE — Procedures (Signed)
John [name], John [name]                 ACCOUNT NO.:  [id number]   MEDICAL RECORD NO.:  [id number]          PATIENT TYPE:  OUT   LOCATION:  SLEEP CENTER                 FACILITY:  [facility]   PHYSICIAN:  Coralyn Helling, MD        DATE OF BIRTH:  [birth year]   DATE OF STUDY:  01/31/2006                            NOCTURNAL POLYSOMNOGRAM   REFERRING PHYSICIAN:  Coralyn Helling, MD.   DATE OF STUDY:  January 31, 2006.   INDICATION FOR STUDY:  This is an individual who has symptoms of  excessive daytime sleepiness and sleep disruption.  He is referred to  the sleep lab for evaluation of hypersomnia with obstructive sleep  apnea.   EPWORTH SLEEPINESS SCORE:  Two.   MEDICATIONS:  Benadryl, Risperdal, Perphenazine, Nasonex and  benztropine.   SLEEP ARCHITECTURE:  Recording time was 451 minutes.  Total sleep time  was 445 minutes.  Sleep efficiency was 99%.  Sleep latency was 1 minute  which is significantly reduced.  REM latency was 381 minutes.  The  patient was observed in all stages of sleep, although there was a  relative reduction in the percentage of slow wave sleep to 6% of his  study.  The patient was observed in both the supine and nonsupine  position.   RESPIRATORY DATA:  The patient followed a split night study protocol.  The average respiratory rate was 12.  During the diagnostic portion of  the study, the apnea/hypopnea index was 18.9.  The events were  exclusively obstructive in nature.  Moderate to severe snoring was noted  by the technician.  During the therapeutic portion of the study, the  patient was titrated from a CPAP setting of 4 to 19 cm of water.  At a  CPAP pressure setting of 17 cm of water the apnea/hypopnea index was  reduced to 8.8.  There were 4 central events and 6 obstructive events on  this pressure setting.  At this pressure setting, the patient was  observed in both REM sleep and supine sleep and snoring was eliminated.   OXYGEN DATA:  The baseline oxygenation  was 98%.  The oxygen saturation  nadir was 81%.  At a CPAP pressure setting of 17 cm of water the mean  oxygenation during non-REM sleep was 94% and during REM sleep was 92%;  the minimal oxygenation during non-REM sleep was 86% and during REM  sleep was 86%.   CARDIAC DATA:  The average heart rate was 70 and rhythm strips showed  normal sinus rhythm.   MOVEMENT-PARASOMNIA:  The periodic limb movement index was 1.2.  The  patient had one bathroom trip.   IMPRESSIONS-RECOMMENDATIONS:  This is a split night study protocol.  During the diagnostic portion of this study, the patient was found to  have moderate obstructive sleep apnea as demonstrated by an  apnea/hypopnea index of 19 and oxygen saturation of 81%.  During the  therapeutic portion of the study at a CPAP pressure setting of 17  cm of water, the apnea/hypopnea index was reduced to 8.8; however, a  significant proportion of his apneic events at this pressure setting  were central in nature.      Coralyn Helling, MD  Diplomat, American Board of Sleep Medicine  Electronically Signed     VS/MEDQ  D:  02/19/2006 15:50:12  T:  02/20/2006 57:84:69  Job:  [id number]

## 2010-05-19 NOTE — Discharge Summary (Signed)
John [name], John [name]                 ACCOUNT NO.:  [id number]   MEDICAL RECORD NO.:  [id number]          PATIENT TYPE:  IPS   LOCATION:  0402                          [facility]ACILITY:  [facility]   PHYSICIAN:  Jasmine Pang, M.D. DATE OF BIRTH:  1975/08/16   DATE OF ADMISSION:  10/28/2005  DATE OF DISCHARGE:  11/02/2005                                 DISCHARGE SUMMARY   ADDENDUM:   HOSPITAL COURSE CONTINUED:  On October 30, 2005, the patient stated he felt tired.  He stated he was put  in here for medical evaluation.  Mood lives at [facility].  He was not  sure why they wanted him here, but he admitted to having conflict with a  peer who was picking on him.  He stated the peer kept nagging him.  He  stated that the peer had told him he was gay, and he appeared to be making  advances towards him.  On October 31, 2005, the patient was pleasant but  still complained of tooth pain.  He slept well.  Appetite was good.  Denied  auditory or visual hallucinations, was happy that his mother came to visit,  feels like he is getting stronger and can handle the placement at [facility].  On November 01, 2005, the patient discussed going to his group home on  the following day.  He stated he was going to stay out of his way, meaning  the peer who was been bothering him.  He talked about a number of ways that  he could avoid this peer.  The group home is also aware of the harassment  from this peer and are taking care of it from their end.  On November 02, 2005, the patient's mental status had improved.  He had good eye contact.  Speech was normal rate and flow.  Psychomotor activity was within normal  limits.  Mood was less depressed and anxious.  Affect wide range.  There was  no suicidal ideation or homicidal ideation.  No thoughts of self injurious  behavior.  No auditory or visual hallucinations.  No paranoia or delusions.  Thoughts logical and goal-directed.  Thought content no predominant theme.  Cognitive was back to baseline.   DISCHARGE DIAGNOSES:   AXIS I:  Schizophrenia, paranoid type.   AXIS II:  None.   AXIS III:  1. Obesity.  2. Gastroesophageal reflux disease.   AXIS IV:  Stress with conflict at the group home.   AXIS V:  Global assessment of functioning upon discharge was 50; global  assessment of functioning upon admission was 30; global assessment of  functioning highest past year was 58.   POST HOSPITAL CARE PLAN:  1. The patient had no specific activity level or dietary restrictions.  2. A sleep study has been arranged by our nurse practitioner here who      talked with his mother to given information on how to pursue this.   DISCHARGE MEDICATIONS:  1. Benadryl 50 mg at bedtime.  2. Perphenazine 8 mg, one pill daily.  3. Risperdal 3 mg  twice daily.  4. Cogentin 1 mg twice daily.  5. Reserpine 0.5 mg in the a.m. and 0.5 mg at 6:00 p.m.  6. Risperdal Consta 50 mg every 14 days (last dose was on October 30, 2005).  7. Enablex 7.5 mg tablet daily.  8. Prilosec 20 mg, one pill by mouth twice daily.   POST HOSPITAL CARE PLAN:  1. The patient will return to [facility] for followup living      situation.  2. He will return to the [facility] to see Dr. Lang Snow on November 06, 2005 at 3 p.m.  3. He will see above Gretchen Short at the [facility] on November 06, 2005      at 10 a.m.      Jasmine Pang, M.D.  Electronically Signed     BHS/MEDQ  D:  11/02/2005  T:  11/02/2005  Job:  [id number]

## 2010-05-19 NOTE — Discharge Summary (Signed)
John [name], John [name]                 ACCOUNT NO.:  [id number]   MEDICAL RECORD NO.:  [id number]          PATIENT TYPE:  IPS   LOCATION:  0402                          FACILITY:  [facility]   PHYSICIAN:  Jasmine Pang, M.D. DATE OF BIRTH:  06/03/1975   DATE OF ADMISSION:  10/28/2005  DATE OF DISCHARGE:  11/02/2005                                 DISCHARGE SUMMARY   IDENTIFYING INFORMATION:  A 35 year old single African American male who was  admitted on a voluntary basis.   HISTORY OF PRESENT ILLNESS:  The patient has a history of schizophrenia and  drew lines on both wrists with a vague plan to harm himself. He made vague  threats to the house parent in the group home he lives in. He reports being  threatened by a specific individual in the group home.  He states this  individual has told him he was gay and he feels this peer is making  overtures towards him and harassing him. He stated he wanted to bust his  nose or head. The patient feels threatened. He denies auditory  hallucinations or homicidal ideation.  He sees Gretchen Short at the [facility] for outpatient treatment.  This is his third [facility]  admission. He had two admissions in 2002 when responding to internal stimuli  and one-time when he threatened to kill his mother.  He had no homicidal or  suicidal attempts.   MEDICATIONS:  1. Risperdal Consta 50 mg q. 2 weeks.  2. Risperdal 3 mg p.o. b.i.d.  3. Cogentin 1 mg p.o. b.i.d.  4. Trilafon 8 mg p.o. daily.   For further admission information, see psychiatric admission assessment.   PHYSICAL EXAM:  The patient was noted to have a lot of upper airway noise  even when awake.  He has had no history of sleep studies, otherwise he was  healthy and in no acute distress.   ADMISSION LABORATORY DATA:  CBC was within normal limits.  Basic metabolic  panel was within normal limits except for an elevated random glucose of 130  (70-99).  Hepatic function panel was  within normal limits.  Albumin was  slightly decreased at 3.4, calcium was 9.1.  TSH was 1.985 (0.35-5.5).  Urine drug screen was negative.  Urinalysis was negative.   HOSPITAL COURSE:  Upon admission, the patient was placed on Benadryl 50 mg  p.o. q.h.s. He was also continued on Trilafon 8 mg p.o. daily, Risperdal  Consta 50 mg q. 2 weeks, Risperdal 3 mg p.o. b.i.d. Cogentin 1 mg p.o.  b.i.d., reserpine 0.5 mg p.o. b.i.d., Protonix 40 mg in the morning . On  October 29, 2005,  the patient was started  on Enablex 7.5 mg daily since he had been on this prior to admission . On  October 29, 2005, it was ordered to give his dose of Risperdal Consta IM on  October 30, 2005 and on October 30, 2005, the patient was placed on  ibuprofen 400 mg now then q.6 h p.r.n. tooth pain.  The patient tolerated  his medications well with  no significant side effects.      Jasmine Pang, M.D.  Electronically Signed     BHS/MEDQ  D:  11/02/2005  T:  11/02/2005  Job:  [id number]

## 2010-05-19 NOTE — Assessment & Plan Note (Signed)
[facility]                             PULMONARY OFFICE NOTE   PERICLES, CARMICHEAL                        MRN:          [id number]  DATE:03/18/2006                            DOB:          1975-04-02    I saw Mr. Dunkerson with his mother today for a followup of his sleep apnea,  enuresis, and nasal congestion.   He had undergone an overnight polysomnogram on February 01, 2006.  This  was a split night study.  During the diagnostic portion of the study he  was found to have moderate obstructive sleep apnea with an apnea-  hypopnea index of 19 and an oxygen saturation nadir of 81%.  He was  titrated to a CPAP pressure setting of 17 cm of water with good control  of his sleep apnea.   He has since been started on CPAP.  His mother says that when he uses  the CPAP machine he no longer snores at night, and he seems to be  sleeping better.  He however only uses his mask for about 3-5 hours per  night.  He will wake up in the middle of the night and then forget to  put it back on.  His mother has noticed that he is more alert during the  day and continues to have more energy as well, although he still does  occasionally take naps.  When he does take a nap he is not using his  CPAP machine.  He still has episodes of bedwetting, which also happened  during his nap sessions but it seems that this has become less when he  does use his CPAP machine.   He felt that the Nasonex nasal spray seemed to help with his sinus  symptoms but he had ran out of the prescription and his mother was not  aware that she could call our office for a refill.  He has also had  increased wax buildup in his ear and he does have prominent nasal  turbinates.   What I have advised him to do is to use his CPAP machine for the entire  night that he is asleep, and additionally to use it if he is to take a  nap during the day.  This may in fact help some of his symptoms of  enuresis as  well.  I have also instructed him on various techniques for  dieting, exercise, and weight reduction.  I have given him a sample of  Nasacort AQ which he is to use 2 sprays in each nostril once daily, and  his mother has bought him over the counter Debrox for his cerumen  buildup and depending on his response to the Debrox and his response to  the Nasacort AQ he may need to have ENT evaluation.   Otherwise I plan on following up with him in approximately 2-3 months.     Coralyn Helling, MD  Electronically Signed    VS/MedQ  DD: 03/18/2006  DT: 03/18/2006  Job #: [id number]   cc:   Jasmine Pang, M.D.

## 2010-05-19 NOTE — Assessment & Plan Note (Signed)
[facility]                             PULMONARY OFFICE NOTE   BLANDON, OFFERDAHL                        MRN:          [id number]  DATE:12/20/2005                            DOB:          18-Nov-1975    REFERRING PHYSICIAN:  Jasmine Pang, M.D.   [facility] CONSULTATION/>I met Mr. Magowan today with his mother for  evaluation of his sleep difficulties.A  He is currently residing in an  assisted living facility.A  He has a history of paranoid schizophrenia,  in addition to gastroesophageal reflux disease and nocturnal enuresis.A  He apparently has gained a significant amount of weight over the last  several years.A  He currently appears to be sleeping quite a bit during  the day, as well as having difficulties with sleep at night.A  He  usually goes to bed at 8:30, although he is oftentimes falling asleep  before this.A  He has no difficulty falling asleep.A  He wakes up once  or twice during the night to use the bathroom, although he has  oftentimes gone to the bathroom while he is still asleep.A  He has his  alarm go off at 8 in the morning, but says he feels he is quite tired  and he will occasionally get a headache in the morning.A  He then will  take several naps during the day.A  He has been told that he snores  quite loudly and he will wake up sometimes with a coughing sensation.A  He says he does get vivid dreams, but denies having nightmares.A  He  denies any symptoms of restless leg syndrome.A  He does occasionally get  hallucinations, but it is hard to differentiate whether this is related  to his schizophrenia or if it is related to his sleep difficulties.A  He  denies any episodes of sleep paralysis.A  He does occasionally talk in  his sleep, but there is no history of sleep walking.A  He is not using  anything to help him fall asleep at night or stay awake during the day.A   PAST MEDICAL HISTORY:  As stated above.A   CURRENT MEDICATIONS:   1.A  Benadryl 50 mg q.h.s. 2.A  Risperdal 3 mg  b.i.d. 3.A  Cogentin 1 mg b.i.d. 4.A  Enablex 7.5 mg daily.5.A  Reserpine 0.25 mg two tablets b.i.d. 6.A  Prilosec 20 mg b.i.d. 7.A  Pirfenidone 8 mg daily.   ALLERGIES:  He has no known drug allergies.   FAMILY HISTORY:  Significant for hypertension.   SOCIAL HISTORY:  He is single.A  He has no children.A  He lives in an  assisted living center.   REVIEW OF SYSTEMS:  He complains of nasal congestion and difficulty  breathing through his nose.A   PHYSICAL EXAMINATION:  He is 6 feet 2 inches tall, weight is 358 pounds,  temperature is 98.6, blood pressure is 120/76, heart rate is 89, oxygen  saturation is 96% on room air.A  HEENT:A  Pupils reactive.A  Extraocular  muscles are intact.A  He has prominent nasal turbinates with a mild  septal deviation  to the right.A  He has a Mallampati 4 airway.A  There  is no lymphadenopathy, no thyromegaly.HEART:A  S1, S2.A  Regular  rhythm.CHEST:A  Clear to auscultation.ABDOMEN:A  Obese, soft,  nontender.EXTREMITIES:A  There was no edema, cyanosis or  clubbing.NEUROLOGIC EXAM:A  He was awake and alert, had a somewhat  flattened affect.A  No focal deficits were appreciated.A   IMPRESSION:  1.A  Hypersomnia with excessive daytime sleepiness.A  This  could be likely multifactorial.A  I am quite concerned that he may have  some degree of sleep disorder breathing.A  To further evaluate this, I  will make arrangements for him to have an overnight polysomnogram.A  Additionally, he could be having these symptoms related to his multiple  psychiatric medications, but I will defer adjustments of this to his  psychiatry team.A  In the meantime, I have also discussed with him the  importance of diet, exercise and weight-reduction, as I feel his obesity  is playing a significant role with his current symptoms.A  2.A  Nocturnal enuresis.A  While this could be related to his underlying  psychiatric disorder, it is  entirely possible that this may be related  to obstructive sleep apnea and this will be further assessed after  review of the sleep study.3.A  Nasal congestion.A  I have given him a  sample of Nasonex two sprays in each nostril once daily, as well as a  prescription for this.A  I have also instructed him on the use of nasal  irrigation.A  I have informed him that he should use a bulb syringe and  make a mixture of one cup of lukewarm water, one half teaspoon of kosher  salt and one quarter teaspoon of baking soda and that he can squirt this  in each nostril twice daily, preferably before he uses his Nasonex nasal  spray.A  I will follow up with him approximately one week after  completion of his sleep study.A     Coralyn Helling, MD  Electronically Signed    VS/MedQ  DD: 12/20/2005  DT: 12/21/2005  Job #: [id number]   cc:   Jasmine Pang, M.D.

## 2010-05-19 NOTE — H&P (Signed)
[facility]  Patient:    John [name], John [name]                     MRN: [id number] Adm. Date:  [id number] Attending:  Jasmine Pang CC:         [facility].   Psychiatric Admission Assessment  IDENTIFICATION:  Almost 35 year old African-American male who was committed by me secondary to catatonic psychosis.  HISTORY OF PRESENT ILLNESS:  Patient has a history of a psychotic disorder, possibly schizophrenia.  He has become increasingly catatonic, with disorganized thinking, inability to function, mutism, bizarre affect, thought blocking and unusual speech.  He says "yey, yey" repetitively or "nay, nay" when talking to people.  His mother reports a continued decompensation during the past several weeks.  He has gone from being very active athletically and in school to being unable to perform his ADLs.  PAST PSYCHIATRIC HISTORY:  Patient has been seen at the [facility] outpatient clinic.  He was on Geodon 40 mg p.o. b.i.d.  This was discontinued due to side effects.  Zyprexa also caused sedation.  He has been hospitalized at [facility] November 2000.  He was seen once by Barnett Abu, M.D. for medication management.  PAST MEDICAL HISTORY:  Mother states he was seen by Dr. Anne Hahn, a neurologist. A CT scan revealed a benign "cyst" on the brain according to his mother.  Dr. Anne Hahn did not feel this was significant.  Medications:  None currently.  ALLERGIES:  No known drug allergies.  FAMILY PSYCHIATRIC HISTORY:  None.  SOCIAL HISTORY:  Patient lives with his mother.  She is very supportive.  He is unable to work now.  He had been in school but has not been able to return to complete his work.  He has no legal problems.  ALCOHOL DRUG HISTORY:  None.  MENTAL STATUS EXAMINATION:  Patient was a withdrawn, reserved African-American male with no eye contact.  There was severe psychomotor  retardation.  Speech was mute most of the time.  He would answer with "yey yey" or "nay nay" repetitively when he did speak.  There was no suicidal or homicidal ideations. There was positive psychosis as described in history of present illness. Thought processes were disorganized.  There was significant thought blocking. Cognitive exam was quite impaired due to his severe psychosis.  Insight minimal, judgment poor.  ADMISSION DIAGNOSES: Axis I:    Schizophrenia, catatonic type. Axis II:   Deferred. Axis III:  Benign cyst on brain on CT scan. Axis IV:   Severe. Axis V:    Current global assessment of function is 10, highest past year 70.  ASSETS AND STRENGTHS:  Patient has a supportive mother.  He is in good health.  PROBLEMS:  Psychosis with inability to function and catatonia.  SHORT TERM TREATMENT GOAL:  Improvement in psychosis to the point that patient is able to perform ADLs and be managed safely in an outpatient setting.  LONG TERM TREATMENT GOAL:  Complete resolution of psychosis.  INITIAL PLAN OF CARE:  Begin Seroquel in increasing doses as he tolerates. Will begin unit therapeutic groups and activities to provide reality testing. Patients mother will also be able to visit frequently to assist with his reality orientation.  ESTIMATED LENGTH OF INPATIENT TREATMENT:  Three to five days.  CONDITION NECESSARY FOR DISCHARGE:  No catatonia and decrease in psychosis.  POST HOSPITAL CARE PLAN:  Return home to live  with his mother.  Follow up therapy and medication management will be at the [facility]. DD:  04/19/00 TD:  04/20/00 Job: [id number]

## 2010-09-29 LAB — CBC
RBC: 4.34
WBC: 6.6

## 2010-09-29 LAB — COMPREHENSIVE METABOLIC PANEL
ALT: 26
AST: 23
Alkaline Phosphatase: 51
CO2: 32
Calcium: 8.9
Chloride: 101
GFR calc Af Amer: >60
GFR calc non Af Amer: >60
Potassium: 4.1
Sodium: 138
Total Bilirubin: 0.4

## 2010-10-06 LAB — URINALYSIS, ROUTINE W REFLEX MICROSCOPIC
Bilirubin Urine: NEGATIVE
Glucose, UA: NEGATIVE
Protein, ur: 100 — AB

## 2010-10-06 LAB — URINE CULTURE: Colony Count: >=100000

## 2010-10-06 LAB — URINE MICROSCOPIC-ADD ON

## 2010-10-16 LAB — CBC
HCT: 39.5
MCV: 82.7
Platelets: 229
RBC: 4.77
WBC: 12 — ABNORMAL HIGH

## 2010-10-16 LAB — COMPREHENSIVE METABOLIC PANEL
BUN: 13
CO2: 31
Chloride: 103
Creatinine, Ser: 1.04
GFR calc non Af Amer: >60
Total Bilirubin: 0.5

## 2010-10-16 LAB — URINALYSIS, ROUTINE W REFLEX MICROSCOPIC
Bilirubin Urine: NEGATIVE
Nitrite: NEGATIVE
Specific Gravity, Urine: 1.029
Urobilinogen, UA: 0.2
pH: 6

## 2010-10-16 LAB — DRUGS OF ABUSE SCREEN W/O ALC, ROUTINE URINE
Amphetamine Screen, Ur: NEGATIVE
Benzodiazepines.: NEGATIVE
Creatinine,U: 373.3
Marijuana Metabolite: NEGATIVE
Opiate Screen, Urine: NEGATIVE
Propoxyphene: NEGATIVE

## 2011-01-08 DIAGNOSIS — F259 Schizoaffective disorder, unspecified: Secondary | ICD-10-CM | POA: Diagnosis not present

## 2011-01-25 DIAGNOSIS — F259 Schizoaffective disorder, unspecified: Secondary | ICD-10-CM | POA: Diagnosis not present

## 2011-02-02 DIAGNOSIS — F259 Schizoaffective disorder, unspecified: Secondary | ICD-10-CM | POA: Diagnosis not present

## 2011-02-12 DIAGNOSIS — F259 Schizoaffective disorder, unspecified: Secondary | ICD-10-CM | POA: Diagnosis not present

## 2011-02-16 DIAGNOSIS — Z79899 Other long term (current) drug therapy: Secondary | ICD-10-CM | POA: Diagnosis not present

## 2011-02-27 DIAGNOSIS — F259 Schizoaffective disorder, unspecified: Secondary | ICD-10-CM | POA: Diagnosis not present

## 2011-03-05 DIAGNOSIS — Z79899 Other long term (current) drug therapy: Secondary | ICD-10-CM | POA: Diagnosis not present

## 2011-03-13 DIAGNOSIS — Z79899 Other long term (current) drug therapy: Secondary | ICD-10-CM | POA: Diagnosis not present

## 2011-03-20 DIAGNOSIS — Z79899 Other long term (current) drug therapy: Secondary | ICD-10-CM | POA: Diagnosis not present

## 2011-04-03 DIAGNOSIS — F259 Schizoaffective disorder, unspecified: Secondary | ICD-10-CM | POA: Diagnosis not present

## 2011-04-18 DIAGNOSIS — F259 Schizoaffective disorder, unspecified: Secondary | ICD-10-CM | POA: Diagnosis not present

## 2011-05-04 DIAGNOSIS — F259 Schizoaffective disorder, unspecified: Secondary | ICD-10-CM | POA: Diagnosis not present

## 2011-05-24 DIAGNOSIS — F259 Schizoaffective disorder, unspecified: Secondary | ICD-10-CM | POA: Diagnosis not present

## 2011-06-12 DIAGNOSIS — F259 Schizoaffective disorder, unspecified: Secondary | ICD-10-CM | POA: Diagnosis not present

## 2011-06-25 DIAGNOSIS — F259 Schizoaffective disorder, unspecified: Secondary | ICD-10-CM | POA: Diagnosis not present

## 2011-07-18 DIAGNOSIS — F259 Schizoaffective disorder, unspecified: Secondary | ICD-10-CM | POA: Diagnosis not present

## 2011-07-26 DIAGNOSIS — F259 Schizoaffective disorder, unspecified: Secondary | ICD-10-CM | POA: Diagnosis not present

## 2011-08-07 DIAGNOSIS — F259 Schizoaffective disorder, unspecified: Secondary | ICD-10-CM | POA: Diagnosis not present

## 2011-08-15 DIAGNOSIS — F259 Schizoaffective disorder, unspecified: Secondary | ICD-10-CM | POA: Diagnosis not present

## 2011-08-21 ENCOUNTER — Emergency Department (HOSPITAL_COMMUNITY)
Admission: EM | Admit: 2011-08-21 | Discharge: 2011-08-21 | Disposition: A | Discharge status: Home / Self Care | Payer: Medicare Other | Attending: Emergency Medicine | Admitting: Emergency Medicine

## 2011-08-21 ENCOUNTER — Encounter (HOSPITAL_COMMUNITY): Payer: Self-pay | Admitting: Emergency Medicine

## 2011-08-21 ENCOUNTER — Emergency Department (HOSPITAL_COMMUNITY): Payer: Medicare Other

## 2011-08-21 DIAGNOSIS — E119 Type 2 diabetes mellitus without complications: Secondary | ICD-10-CM | POA: Insufficient documentation

## 2011-08-21 DIAGNOSIS — R42 Dizziness and giddiness: Secondary | ICD-10-CM | POA: Diagnosis not present

## 2011-08-21 DIAGNOSIS — R5381 Other malaise: Secondary | ICD-10-CM | POA: Diagnosis not present

## 2011-08-21 DIAGNOSIS — Z79899 Other long term (current) drug therapy: Secondary | ICD-10-CM | POA: Insufficient documentation

## 2011-08-21 DIAGNOSIS — R404 Transient alteration of awareness: Secondary | ICD-10-CM | POA: Insufficient documentation

## 2011-08-21 DIAGNOSIS — J45909 Unspecified asthma, uncomplicated: Secondary | ICD-10-CM | POA: Insufficient documentation

## 2011-08-21 DIAGNOSIS — E079 Disorder of thyroid, unspecified: Secondary | ICD-10-CM | POA: Insufficient documentation

## 2011-08-21 DIAGNOSIS — R55 Syncope and collapse: Secondary | ICD-10-CM | POA: Diagnosis not present

## 2011-08-21 DIAGNOSIS — R5383 Other fatigue: Secondary | ICD-10-CM | POA: Insufficient documentation

## 2011-08-21 HISTORY — DX: Unspecified asthma, uncomplicated: J45.909

## 2011-08-21 HISTORY — DX: Disorder of thyroid, unspecified: E07.9

## 2011-08-21 HISTORY — DX: Sleep apnea, unspecified: G47.30

## 2011-08-21 HISTORY — DX: Anemia, unspecified: D64.9

## 2011-08-21 LAB — COMPREHENSIVE METABOLIC PANEL
ALT: 41 U/L (ref 0–53)
AST: 29 U/L (ref 0–37)
Albumin: 3.7 g/dL (ref 3.5–5.2)
Alkaline Phosphatase: 66 U/L (ref 39–117)
Chloride: 100 mEq/L (ref 96–112)
Potassium: 4.2 mEq/L (ref 3.5–5.1)
Sodium: 138 mEq/L (ref 135–145)
Total Protein: 7.9 g/dL (ref 6.0–8.3)

## 2011-08-21 LAB — CBC WITH DIFFERENTIAL/PLATELET
Basophils Relative: 0 % (ref 0–1)
Eosinophils Absolute: 0 10*3/uL (ref 0.0–0.7)
Lymphs Abs: 2 10*3/uL (ref 0.7–4.0)
MCH: 26.2 pg (ref 26.0–34.0)
MCHC: 30.9 g/dL (ref 30.0–36.0)
Neutro Abs: 3.9 10*3/uL (ref 1.7–7.7)
Neutrophils Relative %: 60 % (ref 43–77)
Platelets: 234 10*3/uL (ref 150–400)
RBC: 5.12 MIL/uL (ref 4.22–5.81)

## 2011-08-21 MED ORDER — SODIUM CHLORIDE 0.9 % IV BOLUS (SEPSIS)
1000.0000 mL | Freq: Once | INTRAVENOUS | Status: AC
  Administered 2011-08-21: 1000 mL via INTRAVENOUS

## 2011-08-21 NOTE — ED Notes (Addendum)
Pt transported to XR.  
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
## 2011-08-21 NOTE — ED Notes (Addendum)
MD in room at this time. Pt reports starting a new medication last week and after starting medication began experiencing lightheadedness, fatigue and falling asleep more than usual. Pt lives at [facility]

## 2011-08-21 NOTE — ED Notes (Signed)
Pt returned from XR and placed back on monitors.

## 2011-08-21 NOTE — ED Provider Notes (Signed)
History     CSN: [id number]  Arrival date & time 08/21/11  1339   First MD Initiated Contact with Patient 08/21/11 1344      Chief Complaint  Patient presents with  . Near Syncope    (Consider location/radiation/quality/duration/timing/severity/associated sxs/prior treatment) Patient is a 36 y.o. male presenting with syncope. The history is provided by the EMS personnel and the patient. The history is limited by a developmental delay.  Loss of Consciousness This is a new problem. The current episode started in the past 7 days. Episode frequency: multiple times per day. The problem has been unchanged. Associated symptoms include weakness (generalized). Pertinent negatives include no abdominal pain, chest pain, chills, congestion, coughing, fever, headaches, nausea, neck pain, numbness, urinary symptoms or vomiting. Nothing aggravates the symptoms. He has tried nothing for the symptoms.    Past Medical History  Diagnosis Date  . Thyroid disease   . Diabetes mellitus   . Asthma   . Sleep apnea   . Anemia     No past surgical history on file.  History reviewed. No pertinent family history.  History  Substance Use Topics  . Smoking status: Never Smoker   . Smokeless tobacco: Not on file  . Alcohol Use: No      Review of Systems  Constitutional: Negative for fever and chills.  HENT: Negative for congestion, rhinorrhea and neck pain.   Eyes: Negative.   Respiratory: Negative for cough and shortness of breath.   Cardiovascular: Positive for syncope. Negative for chest pain, palpitations and leg swelling.  Gastrointestinal: Negative for nausea, vomiting, abdominal pain, diarrhea, constipation and blood in stool.  Genitourinary: Negative for dysuria and decreased urine volume.  Musculoskeletal: Negative for back pain.  Skin: Negative.   Neurological: Positive for syncope, weakness (generalized) and light-headedness. Negative for numbness and headaches.  All other systems  reviewed and are negative.    Allergies  Review of patient's allergies indicates no known allergies.  Home Medications   Current Outpatient Rx  Name Route Sig Dispense Refill  . ATENOLOL 100 MG PO TABS Oral Take 100 mg by mouth daily.    Marland Kitchen CLOZAPINE 50 MG PO TABS Oral Take 50-200 mg by mouth 3 (three) times daily - between meals and at bedtime. Takes 50 mg twice a day and 200 mg at bedtime    . DIVALPROEX SODIUM 250 MG PO TBEC Oral Take 250 mg by mouth at bedtime.    Marland Kitchen FLUTICASONE PROPIONATE 50 MCG/ACT NA SUSP Nasal Place 2 sprays into the nose 2 (two) times daily.    Marland Kitchen LEVOTHYROXINE SODIUM 75 MCG PO TABS Oral Take 75 mcg by mouth daily.    Marland Kitchen LISINOPRIL 20 MG PO TABS Oral Take 20 mg by mouth daily.    Marland Kitchen LORATADINE 10 MG PO TABS Oral Take 10 mg by mouth daily.    Marland Kitchen LURASIDONE HCL 40 MG PO TABS Oral Take 40 mg by mouth 2 (two) times daily.    Marland Kitchen METFORMIN HCL 1000 MG PO TABS Oral Take 1,000 mg by mouth 2 (two) times daily with a meal.    . ADULT MULTIVITAMIN W/MINERALS CH Oral Take 1 tablet by mouth daily.    . OXYBUTYNIN CHLORIDE 5 MG PO TABS Oral Take 5 mg by mouth daily.    Marland Kitchen PANTOPRAZOLE SODIUM 40 MG PO TBEC Oral Take 40 mg by mouth daily.    Marland Kitchen SIMVASTATIN 20 MG PO TABS Oral Take 20 mg by mouth daily.  BP 95/66  Pulse 86  Temp 98 F (36.7 C) (Oral)  Resp 24  Ht 6' (1.829 m)  SpO2 95%  Physical Exam  Nursing note and vitals reviewed. Constitutional: He is oriented to person, place, and time. He appears well-developed and well-nourished.       obese  HENT:  Head: Normocephalic and atraumatic.  Right Ear: External ear normal.  Left Ear: External ear normal.  Nose: Nose normal.  Eyes: EOM are normal. Pupils are equal, round, and reactive to light. Right eye exhibits no discharge. Left eye exhibits no discharge. Right eye exhibits no nystagmus. Left eye exhibits no nystagmus.  Neck: Neck supple.  Cardiovascular: Normal rate, regular rhythm, normal heart sounds and intact  distal pulses.   Pulmonary/Chest: Effort normal and breath sounds normal.  Abdominal: Soft. He exhibits no distension. There is no tenderness.  Musculoskeletal: He exhibits no edema and no tenderness.  Neurological: He is alert and oriented to person, place, and time. He has normal strength. No cranial nerve deficit or sensory deficit. He exhibits normal muscle tone. GCS eye subscore is 4. GCS verbal subscore is 5. GCS motor subscore is 6.  Skin: Skin is warm and dry.    ED Course  Procedures (including critical care time)  Labs Reviewed  COMPREHENSIVE METABOLIC PANEL - Abnormal; Notable for the following:    Glucose, Bld 104 (*)     Total Bilirubin 0.2 (*)     All other components within normal limits  CBC WITH DIFFERENTIAL  POCT I-STAT TROPONIN I  GLUCOSE, CAPILLARY   Dg Chest 2 View  08/21/2011  *RADIOLOGY REPORT*  Clinical Data: Near-syncope  CHEST - 2 VIEW  Comparison: 01/08/2009  Findings: Stable bronchitic changes.  No focal consolidation.  No pleural effusion or pneumothorax.  Stable borderline cardiomegaly.  Visualized osseous structures are within normal limits.  IMPRESSION: No evidence of acute cardiopulmonary disease.  Stable bronchitic changes.   Original Report Authenticated By: Charline Bills, M.D.      Date: 08/21/2011  Rate: 86  Rhythm: normal sinus rhythm  QRS Axis: normal  Intervals: normal  ST/T Wave abnormalities: early repolarization  Conduction Disutrbances:none  Narrative Interpretation: ST elevations c/w early repolarization. No reciprocal changes  Old EKG Reviewed: none available   1. Lightheadedness       MDM  36 yo male with psych issues and mild MR from group home with intermittent spells of "falling asleep". Feels lightheaded, worse with standing. Denies headache, fevers, neck pain, dyspnea or chest pain. No diarrhea or vomiting. Orthostatics negative. Symptoms started a few days after starting clozapine. Based on side effect profile of this  medication, his drowsiness is likely related to this. Early repol on EKG, trop negative, unlikely to be ACS. Labs benign. Will hold clozapine until seen again by psychiatrist.        Pricilla Loveless, MD 08/21/11 [phone]

## 2011-08-21 NOTE — ED Notes (Signed)
NAD noted at time of d/c home with Brett Canales from [facility].

## 2011-08-21 NOTE — ED Notes (Signed)
Pt changed into gown.

## 2011-08-21 NOTE — Discharge Instructions (Signed)
Your lightheadedness and drowsiness are most likely from the clozapine. I would recommend stopping this until following up with your psychiatrist for medication changes.

## 2011-08-22 DIAGNOSIS — F259 Schizoaffective disorder, unspecified: Secondary | ICD-10-CM | POA: Diagnosis not present

## 2011-08-23 NOTE — ED Provider Notes (Signed)
I saw and evaluated the patient, reviewed the resident's note and I agree with the findings and plan.   Loren Racer, MD 08/23/11 [phone]

## 2011-08-27 DIAGNOSIS — L301 Dyshidrosis [pompholyx]: Secondary | ICD-10-CM | POA: Diagnosis not present

## 2011-08-30 DIAGNOSIS — F259 Schizoaffective disorder, unspecified: Secondary | ICD-10-CM | POA: Diagnosis not present

## 2011-09-13 DIAGNOSIS — F259 Schizoaffective disorder, unspecified: Secondary | ICD-10-CM | POA: Diagnosis not present

## 2011-09-21 DIAGNOSIS — F259 Schizoaffective disorder, unspecified: Secondary | ICD-10-CM | POA: Diagnosis not present

## 2011-09-28 DIAGNOSIS — F259 Schizoaffective disorder, unspecified: Secondary | ICD-10-CM | POA: Diagnosis not present

## 2011-10-02 DIAGNOSIS — L538 Other specified erythematous conditions: Secondary | ICD-10-CM | POA: Diagnosis not present

## 2011-10-02 DIAGNOSIS — L259 Unspecified contact dermatitis, unspecified cause: Secondary | ICD-10-CM | POA: Diagnosis not present

## 2011-10-02 DIAGNOSIS — E109 Type 1 diabetes mellitus without complications: Secondary | ICD-10-CM | POA: Diagnosis not present

## 2011-10-05 ENCOUNTER — Emergency Department (HOSPITAL_COMMUNITY)
Admission: EM | Admit: 2011-10-05 | Discharge: 2011-10-05 | Disposition: A | Discharge status: Home / Self Care | Payer: Medicare Other | Attending: Emergency Medicine | Admitting: Emergency Medicine

## 2011-10-05 ENCOUNTER — Encounter (HOSPITAL_COMMUNITY): Payer: Self-pay | Admitting: *Deleted

## 2011-10-05 DIAGNOSIS — E119 Type 2 diabetes mellitus without complications: Secondary | ICD-10-CM | POA: Diagnosis not present

## 2011-10-05 DIAGNOSIS — E079 Disorder of thyroid, unspecified: Secondary | ICD-10-CM | POA: Insufficient documentation

## 2011-10-05 DIAGNOSIS — Z79899 Other long term (current) drug therapy: Secondary | ICD-10-CM | POA: Diagnosis not present

## 2011-10-05 DIAGNOSIS — R55 Syncope and collapse: Secondary | ICD-10-CM | POA: Diagnosis not present

## 2011-10-05 DIAGNOSIS — G4730 Sleep apnea, unspecified: Secondary | ICD-10-CM | POA: Insufficient documentation

## 2011-10-05 DIAGNOSIS — R404 Transient alteration of awareness: Secondary | ICD-10-CM | POA: Diagnosis not present

## 2011-10-05 DIAGNOSIS — J45909 Unspecified asthma, uncomplicated: Secondary | ICD-10-CM | POA: Insufficient documentation

## 2011-10-05 DIAGNOSIS — R0602 Shortness of breath: Secondary | ICD-10-CM | POA: Insufficient documentation

## 2011-10-05 DIAGNOSIS — I959 Hypotension, unspecified: Secondary | ICD-10-CM | POA: Insufficient documentation

## 2011-10-05 LAB — CBC WITH DIFFERENTIAL/PLATELET
Eosinophils Relative: 0 % (ref 0–5)
HCT: 38.9 % — ABNORMAL LOW (ref 39.0–52.0)
Hemoglobin: 12.6 g/dL — ABNORMAL LOW (ref 13.0–17.0)
Lymphocytes Relative: 21 % (ref 12–46)
Lymphs Abs: 1.8 10*3/uL (ref 0.7–4.0)
MCH: 26.6 pg (ref 26.0–34.0)
MCV: 82.1 fL (ref 78.0–100.0)
Monocytes Absolute: 0.5 10*3/uL (ref 0.1–1.0)
Monocytes Relative: 6 % (ref 3–12)
RBC: 4.74 MIL/uL (ref 4.22–5.81)
WBC: 8.7 10*3/uL (ref 4.0–10.5)

## 2011-10-05 LAB — BASIC METABOLIC PANEL
BUN: 8 mg/dL (ref 6–23)
CO2: 28 mEq/L (ref 19–32)
Calcium: 9.4 mg/dL (ref 8.4–10.5)
Creatinine, Ser: 1.06 mg/dL (ref 0.50–1.35)
Glucose, Bld: 109 mg/dL — ABNORMAL HIGH (ref 70–99)
Sodium: 138 mEq/L (ref 135–145)
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
## 2011-10-05 NOTE — ED Notes (Signed)
Pt resting. Mom will come get pt at 21:30.

## 2011-10-05 NOTE — ED Notes (Signed)
Another call made to [facility] and she informed that someone will be here to get pt. In one hour.  She is unsure of the persons name.

## 2011-10-05 NOTE — ED Notes (Signed)
Pt mom came to pick up pt.

## 2011-10-05 NOTE — ED Notes (Signed)
Called Ms. Graves was called and said she "was working [employer] and the owners are at home."  She then proceded to get a phone call and said "I cant answer it until you hang up."

## 2011-10-05 NOTE — ED Notes (Signed)
Multiple numbers for family and group home workers are noted to be disconnected.  Contacted pt.'s PCP and they have the same disconnected numbers on file.  [facility] contacted and [employer] notified.  Sheriff notified to go to address on file to see if someone is at the "[employer]."  Social Worker paged.

## 2011-10-05 NOTE — ED Notes (Signed)
Call made again to [facility] [phone] message was left.  Currently awaiting return phone call.
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
## 2011-10-05 NOTE — ED Notes (Signed)
[employer] attempted to place phone call to [facility] and got voicemail.  Message was left.

## 2011-10-05 NOTE — ED Notes (Signed)
Social worker called will try and get in tough with pt group home.

## 2011-10-05 NOTE — ED Notes (Signed)
Social work paged at [phone] and currently awaiting return phone call.  [employer] is aware that "someone is coming to get pt."

## 2011-10-05 NOTE — ED Notes (Signed)
Spoke with Sheriff and Loyal Gambler is the Director and Number is [phone].  Number was called and was unable to leave a message at that number.

## 2011-10-08 NOTE — ED Provider Notes (Signed)
Medical screening examination/treatment/procedure(s) were conducted as a shared visit with non-physician practitioner(s) and myself.  I personally evaluated the patient during the encounter  Pt seen/examined no distress.  ekg reviewed and unchanged from recent.  CXR show mild cardiomegaly but unlikely this is acute.  He has been seen for similar episodes previously. Would benefit from outpatient cardiology evaluation but do not feel emergent consultation necessary  Joya Gaskins, MD 10/08/11 [phone]

## 2011-10-12 DIAGNOSIS — E109 Type 1 diabetes mellitus without complications: Secondary | ICD-10-CM | POA: Diagnosis not present

## 2011-10-12 DIAGNOSIS — E038 Other specified hypothyroidism: Secondary | ICD-10-CM | POA: Diagnosis not present

## 2011-10-12 DIAGNOSIS — T887XXA Unspecified adverse effect of drug or medicament, initial encounter: Secondary | ICD-10-CM | POA: Diagnosis not present

## 2011-10-23 ENCOUNTER — Emergency Department (HOSPITAL_COMMUNITY): Payer: Medicare Other

## 2011-10-23 ENCOUNTER — Emergency Department (HOSPITAL_COMMUNITY)
Admission: EM | Admit: 2011-10-23 | Discharge: 2011-10-23 | Disposition: A | Discharge status: Home / Self Care | Payer: Medicare Other | Attending: Emergency Medicine | Admitting: Emergency Medicine

## 2011-10-23 DIAGNOSIS — J45909 Unspecified asthma, uncomplicated: Secondary | ICD-10-CM | POA: Insufficient documentation

## 2011-10-23 DIAGNOSIS — R55 Syncope and collapse: Secondary | ICD-10-CM | POA: Insufficient documentation

## 2011-10-23 DIAGNOSIS — R404 Transient alteration of awareness: Secondary | ICD-10-CM | POA: Diagnosis not present

## 2011-10-23 DIAGNOSIS — H538 Other visual disturbances: Secondary | ICD-10-CM | POA: Diagnosis not present

## 2011-10-23 DIAGNOSIS — Z79899 Other long term (current) drug therapy: Secondary | ICD-10-CM | POA: Diagnosis not present

## 2011-10-23 DIAGNOSIS — E079 Disorder of thyroid, unspecified: Secondary | ICD-10-CM | POA: Insufficient documentation

## 2011-10-23 DIAGNOSIS — I10 Essential (primary) hypertension: Secondary | ICD-10-CM | POA: Insufficient documentation

## 2011-10-23 DIAGNOSIS — I959 Hypotension, unspecified: Secondary | ICD-10-CM | POA: Diagnosis not present

## 2011-10-23 DIAGNOSIS — G4730 Sleep apnea, unspecified: Secondary | ICD-10-CM | POA: Insufficient documentation

## 2011-10-23 DIAGNOSIS — E119 Type 2 diabetes mellitus without complications: Secondary | ICD-10-CM | POA: Diagnosis not present

## 2011-10-23 DIAGNOSIS — Z862 Personal history of diseases of the blood and blood-forming organs and certain disorders involving the immune mechanism: Secondary | ICD-10-CM | POA: Insufficient documentation

## 2011-10-23 LAB — COMPREHENSIVE METABOLIC PANEL
AST: 28 U/L (ref 0–37)
CO2: 29 mEq/L (ref 19–32)
Calcium: 8.7 mg/dL (ref 8.4–10.5)
Creatinine, Ser: 1.17 mg/dL (ref 0.50–1.35)
GFR calc Af Amer: >90 mL/min (ref >90)
GFR calc non Af Amer: 80 mL/min — ABNORMAL LOW (ref >90)
Glucose, Bld: 118 mg/dL — ABNORMAL HIGH (ref 70–99)

## 2011-10-23 LAB — CBC WITH DIFFERENTIAL/PLATELET
Basophils Absolute: 0 10*3/uL (ref 0.0–0.1)
Eosinophils Relative: 0 % (ref 0–5)
HCT: 40.4 % (ref 39.0–52.0)
Lymphocytes Relative: 29 % (ref 12–46)
MCH: 27 pg (ref 26.0–34.0)
MCV: 83.3 fL (ref 78.0–100.0)
Monocytes Absolute: 0.4 10*3/uL (ref 0.1–1.0)
RDW: 14.7 % (ref 11.5–15.5)
WBC: 6.1 10*3/uL (ref 4.0–10.5)

## 2011-10-23 LAB — URINALYSIS, ROUTINE W REFLEX MICROSCOPIC
Bilirubin Urine: NEGATIVE
Ketones, ur: NEGATIVE mg/dL
Nitrite: NEGATIVE
Protein, ur: NEGATIVE mg/dL
Urobilinogen, UA: 1 mg/dL (ref 0.0–1.0)

## 2011-10-23 LAB — GLUCOSE, CAPILLARY
Glucose-Capillary: 117 mg/dL — ABNORMAL HIGH (ref 70–99)
Glucose-Capillary: 127 mg/dL — ABNORMAL HIGH (ref 70–99)

## 2011-10-23 MED ORDER — SODIUM CHLORIDE 0.9 % IV BOLUS (SEPSIS)
1000.0000 mL | Freq: Once | INTRAVENOUS | Status: AC
  Administered 2011-10-23: 1000 mL via INTRAVENOUS

## 2011-10-23 NOTE — ED Provider Notes (Signed)
History     CSN: [id number]  Arrival date & time 10/23/11  1438   First MD Initiated Contact with Patient 10/23/11 1521      Chief Complaint  Patient presents with  . Weakness    (Consider location/radiation/quality/duration/timing/severity/associated sxs/prior treatment) HPI Comments: Patient is a 36 year old male with a past medical history of obesity, diabetes, HTN, mild MR, bipolar disorder who presents with sudden onset of dizziness and blurred vision that lasted 5 minutes and spontaneously resolved without intervention. The patient was sitting outside when the symptoms started and resolved. Witnesses say he was unresponsive for an unknown amount of time. The patient lives in a group home. The patient denies head trama or current pain. He was last seen in the ED 2 weeks ago for similar symptoms. His mother, who is at the bedside, thinks he has these recurrent symptoms due to dehydration because he does not drink enough throughout the day. Patient was recommended to follow up with Cardiology after his last visit which he did not do. No alleviating/aggravating factors. Patient denies headache, fever, NVD, chest pain, SOB, abdominal pain, and current visual disturbances.    Past Medical History  Diagnosis Date  . Thyroid disease   . Diabetes mellitus   . Asthma   . Sleep apnea   . Anemia     No past surgical history on file.  No family history on file.  History  Substance Use Topics  . Smoking status: Never Smoker   . Smokeless tobacco: Not on file  . Alcohol Use: No      Review of Systems  Eyes: Positive for visual disturbance.  Neurological: Positive for light-headedness.  All other systems reviewed and are negative.    Allergies  Review of patient's allergies indicates no known allergies.  Home Medications   Current Outpatient Rx  Name Route Sig Dispense Refill  . ATENOLOL 100 MG PO TABS Oral Take 100 mg by mouth daily.    Marland Kitchen CLOZAPINE 200 MG PO TABS Oral  Take 200 mg by mouth daily.     Marland Kitchen DIVALPROEX SODIUM ER 500 MG PO TB24 Oral Take 1,000 mg by mouth at bedtime.    Marland Kitchen LEVOTHYROXINE SODIUM 75 MCG PO TABS Oral Take 75 mcg by mouth daily.    Marland Kitchen LISINOPRIL 20 MG PO TABS Oral Take 20 mg by mouth 2 (two) times daily.     Marland Kitchen LORATADINE 10 MG PO TABS Oral Take 10 mg by mouth daily.    Marland Kitchen LURASIDONE HCL 40 MG PO TABS Oral Take 120 mg by mouth daily with breakfast.     . METFORMIN HCL 1000 MG PO TABS Oral Take 1,000 mg by mouth 2 (two) times daily with a meal.    . ADULT MULTIVITAMIN W/MINERALS CH Oral Take 1 tablet by mouth daily.    Marland Kitchen OMEPRAZOLE 20 MG PO CPDR Oral Take 20 mg by mouth daily.    . OXYBUTYNIN CHLORIDE 5 MG PO TABS Oral Take 5 mg by mouth daily.    Marland Kitchen RISPERIDONE 1 MG PO TABS Oral Take 1 mg by mouth daily as needed. For agitation    . SIMVASTATIN 20 MG PO TABS Oral Take 20 mg by mouth daily.      BP 108/64  Pulse 80  Temp 97.8 F (36.6 C) (Oral)  Resp 17  SpO2 100%  Physical Exam  Nursing note and vitals reviewed. Constitutional: He is oriented to person, place, and time. He appears well-developed and well-nourished. No  distress.       Patient is somnolent.   HENT:  Head: Normocephalic and atraumatic.  Mouth/Throat: No oropharyngeal exudate.  Eyes: Conjunctivae normal and EOM are normal. Pupils are equal, round, and reactive to light. No scleral icterus.  Neck: Normal range of motion. Neck supple.  Cardiovascular: Normal rate and regular rhythm.  Exam reveals no gallop and no friction rub.   No murmur heard. Pulmonary/Chest: Effort normal and breath sounds normal. He has no wheezes. He has no rales. He exhibits no tenderness.  Abdominal: Soft. He exhibits no distension. There is no tenderness. There is no rebound and no guarding.  Musculoskeletal: Normal range of motion.  Neurological: He is alert and oriented to person, place, and time. No cranial nerve deficit. Coordination normal.       Strength and sensation equal and intact  bilaterally. Speech is goal-oriented. Moves limbs without ataxia.   Skin: Skin is warm and dry. He is not diaphoretic.  Psychiatric: He has a normal mood and affect. His behavior is normal.    ED Course  Procedures (including critical care time)   Date: 10/23/2011  Rate: 74  Rhythm: normal sinus rhythm  QRS Axis: normal  Intervals: PR prolonged  ST/T Wave abnormalities: nonspecific ST/T changes  Conduction Disutrbances:nonspecific intraventricular conduction delay  Narrative Interpretation: NSR unchanged from last EKG from 2 weeks ago  Old EKG Reviewed: unchanged    Labs Reviewed  COMPREHENSIVE METABOLIC PANEL - Abnormal; Notable for the following:    Glucose, Bld 118 (*)     Total Bilirubin 0.2 (*)     GFR calc non Af Amer 80 (*)     All other components within normal limits  VALPROIC ACID LEVEL - Abnormal; Notable for the following:    Valproic Acid Lvl 39.1 (*)     All other components within normal limits  GLUCOSE, CAPILLARY - Abnormal; Notable for the following:    Glucose-Capillary 117 (*)     All other components within normal limits  GLUCOSE, CAPILLARY - Abnormal; Notable for the following:    Glucose-Capillary 127 (*)     All other components within normal limits  CBC WITH DIFFERENTIAL  URINALYSIS, ROUTINE W REFLEX MICROSCOPIC   No results found.   1. Syncope       MDM  3:37 PM Patient's labs pending. EKG unremarkable and unchanged since last visit. Patient will have fluids for dehydration. Denies current pain. No head/neck CT due to no trauma.   6:28 PM Patient feeling better after fluids. Labresults unremarkable. Patient has a Cardiology follow up with Dr. Juel Burrow on October 28. Patient instructed to drink water to stay hydrated. I will discontinue patient's lisinopril as he keeps having episodes of hypotension and instruct him to discuss BP medication at his cardiology follow up. Patient has agreed to drink water. No further evaluation needed at this  time.       Emilia Beck, PA-C 10/23/11 1910

## 2011-10-23 NOTE — ED Notes (Signed)
CBG 117 

## 2011-10-23 NOTE — ED Notes (Signed)
Per report pt was a group program and began to feel week and dizzy.  The staff contacted EMS on their arrival a sitting BP of 104/76 was noted that decreased to 90/74 to standing.  Resp symmetrical and unlabored.

## 2011-10-25 NOTE — ED Provider Notes (Signed)
Medical screening examination/treatment/procedure(s) were performed by non-physician practitioner and as supervising physician I was immediately available for consultation/collaboration.   Gwyneth Sprout, MD 10/25/11 1329

## 2011-10-26 DIAGNOSIS — F259 Schizoaffective disorder, unspecified: Secondary | ICD-10-CM | POA: Diagnosis not present

## 2011-10-29 DIAGNOSIS — E109 Type 1 diabetes mellitus without complications: Secondary | ICD-10-CM | POA: Diagnosis not present

## 2011-10-29 DIAGNOSIS — L259 Unspecified contact dermatitis, unspecified cause: Secondary | ICD-10-CM | POA: Diagnosis not present

## 2011-11-02 DIAGNOSIS — F259 Schizoaffective disorder, unspecified: Secondary | ICD-10-CM | POA: Diagnosis not present

## 2011-11-08 DIAGNOSIS — F259 Schizoaffective disorder, unspecified: Secondary | ICD-10-CM | POA: Diagnosis not present

## 2011-11-15 DIAGNOSIS — F259 Schizoaffective disorder, unspecified: Secondary | ICD-10-CM | POA: Diagnosis not present

## 2011-11-22 DIAGNOSIS — F259 Schizoaffective disorder, unspecified: Secondary | ICD-10-CM | POA: Diagnosis not present

## 2011-11-26 DIAGNOSIS — F259 Schizoaffective disorder, unspecified: Secondary | ICD-10-CM | POA: Diagnosis not present

## 2011-12-10 DIAGNOSIS — L259 Unspecified contact dermatitis, unspecified cause: Secondary | ICD-10-CM | POA: Diagnosis not present

## 2011-12-13 DIAGNOSIS — E119 Type 2 diabetes mellitus without complications: Secondary | ICD-10-CM | POA: Diagnosis not present

## 2011-12-13 DIAGNOSIS — F259 Schizoaffective disorder, unspecified: Secondary | ICD-10-CM | POA: Diagnosis not present

## 2012-01-04 DIAGNOSIS — E109 Type 1 diabetes mellitus without complications: Secondary | ICD-10-CM | POA: Diagnosis not present

## 2012-01-04 DIAGNOSIS — E119 Type 2 diabetes mellitus without complications: Secondary | ICD-10-CM | POA: Diagnosis not present

## 2012-01-08 DIAGNOSIS — F200 Paranoid schizophrenia: Secondary | ICD-10-CM | POA: Diagnosis not present

## 2012-01-13 ENCOUNTER — Emergency Department: Payer: Self-pay | Admitting: Emergency Medicine

## 2012-01-13 DIAGNOSIS — F259 Schizoaffective disorder, unspecified: Secondary | ICD-10-CM | POA: Diagnosis not present

## 2012-01-13 LAB — DRUG SCREEN, URINE
Cannabinoid 50 Ng, Ur ~~LOC~~: NEGATIVE (ref ?–50)
Cocaine Metabolite,Ur ~~LOC~~: NEGATIVE (ref ?–300)
Methadone, Ur Screen: NEGATIVE (ref ?–300)
Opiate, Ur Screen: NEGATIVE (ref ?–300)
Phencyclidine (PCP) Ur S: NEGATIVE (ref ?–25)
Tricyclic, Ur Screen: NEGATIVE (ref ?–1000)

## 2012-01-13 LAB — URINALYSIS, COMPLETE
Bilirubin,UR: NEGATIVE
Blood: NEGATIVE
Ketone: NEGATIVE
Leukocyte Esterase: NEGATIVE
Nitrite: NEGATIVE
Protein: NEGATIVE
Specific Gravity: 1.021 (ref 1.003–1.030)

## 2012-01-13 LAB — CBC
HCT: 38.7 % — ABNORMAL LOW (ref 40.0–52.0)
HGB: 12.4 g/dL — ABNORMAL LOW (ref 13.0–18.0)
MCH: 26.6 pg (ref 26.0–34.0)
MCHC: 32.1 g/dL (ref 32.0–36.0)
MCV: 83 fL (ref 80–100)
Platelet: 211 10*3/uL (ref 150–440)
WBC: 6.9 10*3/uL (ref 3.8–10.6)

## 2012-01-13 LAB — COMPREHENSIVE METABOLIC PANEL
BUN: 8 mg/dL (ref 7–18)
Bilirubin,Total: 0.2 mg/dL (ref 0.2–1.0)
Calcium, Total: 8.6 mg/dL (ref 8.5–10.1)
Chloride: 103 mmol/L (ref 98–107)
Creatinine: 1.16 mg/dL (ref 0.60–1.30)
EGFR (African American): >60
Potassium: 4.1 mmol/L (ref 3.5–5.1)
SGOT(AST): 45 U/L — ABNORMAL HIGH (ref 15–37)
SGPT (ALT): 60 U/L (ref 12–78)

## 2012-01-13 LAB — ACETAMINOPHEN LEVEL: Acetaminophen: <2 ug/mL

## 2012-01-13 LAB — ETHANOL: Ethanol %: <0.003 % (ref 0.000–0.080)

## 2012-01-14 DIAGNOSIS — F259 Schizoaffective disorder, unspecified: Secondary | ICD-10-CM | POA: Diagnosis not present

## 2012-01-14 LAB — VALPROIC ACID LEVEL: Valproic Acid: 65 ug/mL

## 2012-02-28 DIAGNOSIS — F200 Paranoid schizophrenia: Secondary | ICD-10-CM | POA: Diagnosis not present

## 2012-03-31 DIAGNOSIS — F200 Paranoid schizophrenia: Secondary | ICD-10-CM | POA: Diagnosis not present

## 2012-04-02 DIAGNOSIS — E038 Other specified hypothyroidism: Secondary | ICD-10-CM | POA: Diagnosis not present

## 2012-04-02 DIAGNOSIS — L259 Unspecified contact dermatitis, unspecified cause: Secondary | ICD-10-CM | POA: Diagnosis not present

## 2012-04-02 DIAGNOSIS — E109 Type 1 diabetes mellitus without complications: Secondary | ICD-10-CM | POA: Diagnosis not present

## 2012-04-24 DIAGNOSIS — F200 Paranoid schizophrenia: Secondary | ICD-10-CM | POA: Diagnosis not present

## 2012-04-28 DIAGNOSIS — F259 Schizoaffective disorder, unspecified: Secondary | ICD-10-CM | POA: Diagnosis not present

## 2012-05-09 DIAGNOSIS — F200 Paranoid schizophrenia: Secondary | ICD-10-CM | POA: Diagnosis not present

## 2012-05-16 DIAGNOSIS — F200 Paranoid schizophrenia: Secondary | ICD-10-CM | POA: Diagnosis not present

## 2012-05-27 DIAGNOSIS — F200 Paranoid schizophrenia: Secondary | ICD-10-CM | POA: Diagnosis not present

## 2012-06-02 DIAGNOSIS — F200 Paranoid schizophrenia: Secondary | ICD-10-CM | POA: Diagnosis not present

## 2012-06-03 DIAGNOSIS — Z79899 Other long term (current) drug therapy: Secondary | ICD-10-CM | POA: Diagnosis not present

## 2012-06-03 DIAGNOSIS — F259 Schizoaffective disorder, unspecified: Secondary | ICD-10-CM | POA: Diagnosis not present

## 2012-06-07 ENCOUNTER — Emergency Department: Payer: Self-pay | Admitting: Emergency Medicine

## 2012-06-07 DIAGNOSIS — IMO0002 Reserved for concepts with insufficient information to code with codable children: Secondary | ICD-10-CM | POA: Diagnosis not present

## 2012-06-07 DIAGNOSIS — Z79899 Other long term (current) drug therapy: Secondary | ICD-10-CM | POA: Diagnosis not present

## 2012-06-07 DIAGNOSIS — E119 Type 2 diabetes mellitus without complications: Secondary | ICD-10-CM | POA: Diagnosis not present

## 2012-06-07 DIAGNOSIS — M25469 Effusion, unspecified knee: Secondary | ICD-10-CM | POA: Diagnosis not present

## 2012-06-07 DIAGNOSIS — J45909 Unspecified asthma, uncomplicated: Secondary | ICD-10-CM | POA: Diagnosis not present

## 2012-06-09 DIAGNOSIS — F200 Paranoid schizophrenia: Secondary | ICD-10-CM | POA: Diagnosis not present

## 2012-06-16 DIAGNOSIS — F200 Paranoid schizophrenia: Secondary | ICD-10-CM | POA: Diagnosis not present

## 2012-06-20 DIAGNOSIS — F200 Paranoid schizophrenia: Secondary | ICD-10-CM | POA: Diagnosis not present

## 2012-06-25 DIAGNOSIS — M79609 Pain in unspecified limb: Secondary | ICD-10-CM | POA: Diagnosis not present

## 2012-06-25 DIAGNOSIS — B351 Tinea unguium: Secondary | ICD-10-CM | POA: Diagnosis not present

## 2012-06-27 DIAGNOSIS — F200 Paranoid schizophrenia: Secondary | ICD-10-CM | POA: Diagnosis not present

## 2012-06-30 DIAGNOSIS — E109 Type 1 diabetes mellitus without complications: Secondary | ICD-10-CM | POA: Diagnosis not present

## 2012-06-30 DIAGNOSIS — E038 Other specified hypothyroidism: Secondary | ICD-10-CM | POA: Diagnosis not present

## 2012-06-30 DIAGNOSIS — L259 Unspecified contact dermatitis, unspecified cause: Secondary | ICD-10-CM | POA: Diagnosis not present

## 2012-07-07 DIAGNOSIS — F200 Paranoid schizophrenia: Secondary | ICD-10-CM | POA: Diagnosis not present

## 2012-07-11 DIAGNOSIS — F200 Paranoid schizophrenia: Secondary | ICD-10-CM | POA: Diagnosis not present

## 2012-07-17 DIAGNOSIS — F200 Paranoid schizophrenia: Secondary | ICD-10-CM | POA: Diagnosis not present

## 2012-07-24 DIAGNOSIS — F200 Paranoid schizophrenia: Secondary | ICD-10-CM | POA: Diagnosis not present

## 2012-08-01 DIAGNOSIS — F200 Paranoid schizophrenia: Secondary | ICD-10-CM | POA: Diagnosis not present

## 2012-08-08 DIAGNOSIS — F200 Paranoid schizophrenia: Secondary | ICD-10-CM | POA: Diagnosis not present

## 2012-08-15 DIAGNOSIS — F200 Paranoid schizophrenia: Secondary | ICD-10-CM | POA: Diagnosis not present

## 2012-08-19 DIAGNOSIS — F200 Paranoid schizophrenia: Secondary | ICD-10-CM | POA: Diagnosis not present

## 2012-08-26 DIAGNOSIS — F200 Paranoid schizophrenia: Secondary | ICD-10-CM | POA: Diagnosis not present

## 2012-09-03 DIAGNOSIS — F200 Paranoid schizophrenia: Secondary | ICD-10-CM | POA: Diagnosis not present

## 2012-09-11 DIAGNOSIS — F200 Paranoid schizophrenia: Secondary | ICD-10-CM | POA: Diagnosis not present

## 2012-09-18 DIAGNOSIS — F200 Paranoid schizophrenia: Secondary | ICD-10-CM | POA: Diagnosis not present

## 2012-09-22 DIAGNOSIS — R079 Chest pain, unspecified: Secondary | ICD-10-CM | POA: Diagnosis not present

## 2012-09-22 DIAGNOSIS — R0602 Shortness of breath: Secondary | ICD-10-CM | POA: Diagnosis not present

## 2012-09-22 DIAGNOSIS — E038 Other specified hypothyroidism: Secondary | ICD-10-CM | POA: Diagnosis not present

## 2012-09-22 DIAGNOSIS — R0789 Other chest pain: Secondary | ICD-10-CM | POA: Diagnosis not present

## 2012-09-24 ENCOUNTER — Ambulatory Visit: Payer: Self-pay | Admitting: Internal Medicine

## 2012-09-24 DIAGNOSIS — I517 Cardiomegaly: Secondary | ICD-10-CM | POA: Diagnosis not present

## 2012-09-24 DIAGNOSIS — F200 Paranoid schizophrenia: Secondary | ICD-10-CM | POA: Diagnosis not present

## 2012-09-24 DIAGNOSIS — R079 Chest pain, unspecified: Secondary | ICD-10-CM | POA: Diagnosis not present

## 2012-10-02 DIAGNOSIS — F200 Paranoid schizophrenia: Secondary | ICD-10-CM | POA: Diagnosis not present

## 2012-10-09 DIAGNOSIS — F200 Paranoid schizophrenia: Secondary | ICD-10-CM | POA: Diagnosis not present

## 2012-10-14 DIAGNOSIS — F259 Schizoaffective disorder, unspecified: Secondary | ICD-10-CM | POA: Diagnosis not present

## 2012-10-14 DIAGNOSIS — F200 Paranoid schizophrenia: Secondary | ICD-10-CM | POA: Diagnosis not present

## 2012-10-21 DIAGNOSIS — F259 Schizoaffective disorder, unspecified: Secondary | ICD-10-CM | POA: Diagnosis not present

## 2012-10-21 DIAGNOSIS — F200 Paranoid schizophrenia: Secondary | ICD-10-CM | POA: Diagnosis not present

## 2012-10-28 DIAGNOSIS — F209 Schizophrenia, unspecified: Secondary | ICD-10-CM | POA: Diagnosis not present

## 2012-10-28 DIAGNOSIS — E1129 Type 2 diabetes mellitus with other diabetic kidney complication: Secondary | ICD-10-CM | POA: Diagnosis not present

## 2012-10-28 DIAGNOSIS — G4730 Sleep apnea, unspecified: Secondary | ICD-10-CM | POA: Diagnosis not present

## 2012-10-28 DIAGNOSIS — N189 Chronic kidney disease, unspecified: Secondary | ICD-10-CM | POA: Diagnosis not present

## 2012-10-31 DIAGNOSIS — F200 Paranoid schizophrenia: Secondary | ICD-10-CM | POA: Diagnosis not present

## 2012-11-03 DIAGNOSIS — L538 Other specified erythematous conditions: Secondary | ICD-10-CM | POA: Diagnosis not present

## 2012-11-03 DIAGNOSIS — F209 Schizophrenia, unspecified: Secondary | ICD-10-CM | POA: Diagnosis not present

## 2012-11-03 DIAGNOSIS — Z789 Other specified health status: Secondary | ICD-10-CM | POA: Diagnosis not present

## 2012-11-13 DIAGNOSIS — F200 Paranoid schizophrenia: Secondary | ICD-10-CM | POA: Diagnosis not present

## 2012-11-19 DIAGNOSIS — F200 Paranoid schizophrenia: Secondary | ICD-10-CM | POA: Diagnosis not present

## 2012-11-25 DIAGNOSIS — E109 Type 1 diabetes mellitus without complications: Secondary | ICD-10-CM | POA: Diagnosis not present

## 2012-11-25 DIAGNOSIS — Z1331 Encounter for screening for depression: Secondary | ICD-10-CM | POA: Diagnosis not present

## 2012-11-25 DIAGNOSIS — F209 Schizophrenia, unspecified: Secondary | ICD-10-CM | POA: Diagnosis not present

## 2012-11-25 DIAGNOSIS — E1129 Type 2 diabetes mellitus with other diabetic kidney complication: Secondary | ICD-10-CM | POA: Diagnosis not present

## 2012-11-25 DIAGNOSIS — N189 Chronic kidney disease, unspecified: Secondary | ICD-10-CM | POA: Diagnosis not present

## 2012-11-25 DIAGNOSIS — Z0000 Encounter for general adult medical examination without abnormal findings: Secondary | ICD-10-CM | POA: Diagnosis not present

## 2012-11-26 DIAGNOSIS — F200 Paranoid schizophrenia: Secondary | ICD-10-CM | POA: Diagnosis not present

## 2012-12-06 ENCOUNTER — Emergency Department: Payer: Self-pay | Admitting: Emergency Medicine

## 2012-12-06 DIAGNOSIS — R918 Other nonspecific abnormal finding of lung field: Secondary | ICD-10-CM | POA: Diagnosis not present

## 2012-12-06 DIAGNOSIS — Z79899 Other long term (current) drug therapy: Secondary | ICD-10-CM | POA: Diagnosis not present

## 2012-12-06 DIAGNOSIS — J45909 Unspecified asthma, uncomplicated: Secondary | ICD-10-CM | POA: Diagnosis not present

## 2012-12-06 DIAGNOSIS — I10 Essential (primary) hypertension: Secondary | ICD-10-CM | POA: Diagnosis not present

## 2012-12-06 DIAGNOSIS — Z8659 Personal history of other mental and behavioral disorders: Secondary | ICD-10-CM | POA: Diagnosis not present

## 2012-12-06 DIAGNOSIS — R7611 Nonspecific reaction to tuberculin skin test without active tuberculosis: Secondary | ICD-10-CM | POA: Diagnosis not present

## 2012-12-11 DIAGNOSIS — F200 Paranoid schizophrenia: Secondary | ICD-10-CM | POA: Diagnosis not present

## 2012-12-29 DIAGNOSIS — F200 Paranoid schizophrenia: Secondary | ICD-10-CM | POA: Diagnosis not present

## 2013-01-08 DIAGNOSIS — F200 Paranoid schizophrenia: Secondary | ICD-10-CM | POA: Diagnosis not present

## 2013-01-12 DIAGNOSIS — F200 Paranoid schizophrenia: Secondary | ICD-10-CM | POA: Diagnosis not present

## 2013-01-15 DIAGNOSIS — F200 Paranoid schizophrenia: Secondary | ICD-10-CM | POA: Diagnosis not present

## 2013-01-20 DIAGNOSIS — Z789 Other specified health status: Secondary | ICD-10-CM | POA: Diagnosis not present

## 2013-01-20 DIAGNOSIS — E785 Hyperlipidemia, unspecified: Secondary | ICD-10-CM | POA: Diagnosis not present

## 2013-01-20 DIAGNOSIS — R319 Hematuria, unspecified: Secondary | ICD-10-CM | POA: Diagnosis not present

## 2013-01-20 DIAGNOSIS — R5383 Other fatigue: Secondary | ICD-10-CM | POA: Diagnosis not present

## 2013-01-20 DIAGNOSIS — R5381 Other malaise: Secondary | ICD-10-CM | POA: Diagnosis not present

## 2013-01-28 DIAGNOSIS — F200 Paranoid schizophrenia: Secondary | ICD-10-CM | POA: Diagnosis not present

## 2013-01-28 DIAGNOSIS — L408 Other psoriasis: Secondary | ICD-10-CM | POA: Diagnosis not present

## 2013-02-12 DIAGNOSIS — F200 Paranoid schizophrenia: Secondary | ICD-10-CM | POA: Diagnosis not present

## 2013-02-23 DIAGNOSIS — E038 Other specified hypothyroidism: Secondary | ICD-10-CM | POA: Diagnosis not present

## 2013-02-23 DIAGNOSIS — K2100 Gastro-esophageal reflux disease with esophagitis, without bleeding: Secondary | ICD-10-CM | POA: Diagnosis not present

## 2013-02-23 DIAGNOSIS — M1710 Unilateral primary osteoarthritis, unspecified knee: Secondary | ICD-10-CM | POA: Diagnosis not present

## 2013-02-23 DIAGNOSIS — IMO0002 Reserved for concepts with insufficient information to code with codable children: Secondary | ICD-10-CM | POA: Diagnosis not present

## 2013-02-25 DIAGNOSIS — F200 Paranoid schizophrenia: Secondary | ICD-10-CM | POA: Diagnosis not present

## 2013-03-05 DIAGNOSIS — E1129 Type 2 diabetes mellitus with other diabetic kidney complication: Secondary | ICD-10-CM | POA: Diagnosis not present

## 2013-03-05 DIAGNOSIS — L259 Unspecified contact dermatitis, unspecified cause: Secondary | ICD-10-CM | POA: Diagnosis not present

## 2013-03-05 DIAGNOSIS — E038 Other specified hypothyroidism: Secondary | ICD-10-CM | POA: Diagnosis not present

## 2013-03-05 DIAGNOSIS — E109 Type 1 diabetes mellitus without complications: Secondary | ICD-10-CM | POA: Diagnosis not present

## 2013-03-05 DIAGNOSIS — K2100 Gastro-esophageal reflux disease with esophagitis, without bleeding: Secondary | ICD-10-CM | POA: Diagnosis not present

## 2013-03-11 DIAGNOSIS — F200 Paranoid schizophrenia: Secondary | ICD-10-CM | POA: Diagnosis not present

## 2013-03-12 ENCOUNTER — Ambulatory Visit: Payer: Medicare Other | Admitting: Podiatrist

## 2013-03-17 DIAGNOSIS — J189 Pneumonia, unspecified organism: Secondary | ICD-10-CM | POA: Diagnosis not present

## 2013-03-24 DIAGNOSIS — F200 Paranoid schizophrenia: Secondary | ICD-10-CM | POA: Diagnosis not present

## 2013-03-24 DIAGNOSIS — R9389 Abnormal findings on diagnostic imaging of other specified body structures: Secondary | ICD-10-CM | POA: Diagnosis not present

## 2013-03-24 DIAGNOSIS — E038 Other specified hypothyroidism: Secondary | ICD-10-CM | POA: Diagnosis not present

## 2013-03-26 DIAGNOSIS — L408 Other psoriasis: Secondary | ICD-10-CM | POA: Diagnosis not present

## 2013-03-27 ENCOUNTER — Ambulatory Visit: Payer: Self-pay | Admitting: Internal Medicine

## 2013-03-27 DIAGNOSIS — K7689 Other specified diseases of liver: Secondary | ICD-10-CM | POA: Diagnosis not present

## 2013-03-27 DIAGNOSIS — J90 Pleural effusion, not elsewhere classified: Secondary | ICD-10-CM | POA: Diagnosis not present

## 2013-03-27 DIAGNOSIS — R222 Localized swelling, mass and lump, trunk: Secondary | ICD-10-CM | POA: Diagnosis not present

## 2013-03-27 DIAGNOSIS — R918 Other nonspecific abnormal finding of lung field: Secondary | ICD-10-CM | POA: Diagnosis not present

## 2013-04-16 DIAGNOSIS — F200 Paranoid schizophrenia: Secondary | ICD-10-CM | POA: Diagnosis not present

## 2013-04-23 DIAGNOSIS — G4733 Obstructive sleep apnea (adult) (pediatric): Secondary | ICD-10-CM | POA: Diagnosis not present

## 2013-04-23 DIAGNOSIS — R222 Localized swelling, mass and lump, trunk: Secondary | ICD-10-CM | POA: Diagnosis not present

## 2013-04-28 DIAGNOSIS — F259 Schizoaffective disorder, unspecified: Secondary | ICD-10-CM | POA: Diagnosis not present

## 2013-04-29 DIAGNOSIS — F200 Paranoid schizophrenia: Secondary | ICD-10-CM | POA: Diagnosis not present

## 2013-05-13 DIAGNOSIS — F200 Paranoid schizophrenia: Secondary | ICD-10-CM | POA: Diagnosis not present

## 2013-05-29 DIAGNOSIS — F200 Paranoid schizophrenia: Secondary | ICD-10-CM | POA: Diagnosis not present

## 2013-06-10 DIAGNOSIS — F200 Paranoid schizophrenia: Secondary | ICD-10-CM | POA: Diagnosis not present

## 2013-06-15 DIAGNOSIS — G4733 Obstructive sleep apnea (adult) (pediatric): Secondary | ICD-10-CM | POA: Diagnosis not present

## 2013-06-15 DIAGNOSIS — E109 Type 1 diabetes mellitus without complications: Secondary | ICD-10-CM | POA: Diagnosis not present

## 2013-06-15 DIAGNOSIS — IMO0001 Reserved for inherently not codable concepts without codable children: Secondary | ICD-10-CM | POA: Diagnosis not present

## 2013-06-18 DIAGNOSIS — R0602 Shortness of breath: Secondary | ICD-10-CM | POA: Diagnosis not present

## 2013-06-18 DIAGNOSIS — G4733 Obstructive sleep apnea (adult) (pediatric): Secondary | ICD-10-CM | POA: Diagnosis not present

## 2013-06-18 DIAGNOSIS — R7611 Nonspecific reaction to tuberculin skin test without active tuberculosis: Secondary | ICD-10-CM | POA: Diagnosis not present

## 2013-06-18 DIAGNOSIS — D4989 Neoplasm of unspecified behavior of other specified sites: Secondary | ICD-10-CM | POA: Diagnosis not present

## 2013-06-25 ENCOUNTER — Ambulatory Visit: Payer: Self-pay | Admitting: Cardiothoracic Surgery

## 2013-06-25 DIAGNOSIS — F200 Paranoid schizophrenia: Secondary | ICD-10-CM | POA: Diagnosis not present

## 2013-06-25 DIAGNOSIS — Z01812 Encounter for preprocedural laboratory examination: Secondary | ICD-10-CM | POA: Diagnosis not present

## 2013-06-25 DIAGNOSIS — D150 Benign neoplasm of thymus: Secondary | ICD-10-CM | POA: Diagnosis not present

## 2013-07-01 ENCOUNTER — Ambulatory Visit: Payer: Self-pay | Admitting: Cardiothoracic Surgery

## 2013-07-01 DIAGNOSIS — F200 Paranoid schizophrenia: Secondary | ICD-10-CM | POA: Diagnosis not present

## 2013-07-01 DIAGNOSIS — K921 Melena: Secondary | ICD-10-CM | POA: Diagnosis not present

## 2013-07-01 DIAGNOSIS — R222 Localized swelling, mass and lump, trunk: Secondary | ICD-10-CM | POA: Diagnosis not present

## 2013-07-01 DIAGNOSIS — J45909 Unspecified asthma, uncomplicated: Secondary | ICD-10-CM | POA: Diagnosis not present

## 2013-07-01 DIAGNOSIS — E119 Type 2 diabetes mellitus without complications: Secondary | ICD-10-CM | POA: Diagnosis not present

## 2013-07-01 DIAGNOSIS — R079 Chest pain, unspecified: Secondary | ICD-10-CM | POA: Diagnosis not present

## 2013-07-01 DIAGNOSIS — G4730 Sleep apnea, unspecified: Secondary | ICD-10-CM | POA: Diagnosis not present

## 2013-07-01 DIAGNOSIS — E669 Obesity, unspecified: Secondary | ICD-10-CM | POA: Diagnosis not present

## 2013-07-01 DIAGNOSIS — F609 Personality disorder, unspecified: Secondary | ICD-10-CM | POA: Diagnosis not present

## 2013-07-01 DIAGNOSIS — E039 Hypothyroidism, unspecified: Secondary | ICD-10-CM | POA: Diagnosis not present

## 2013-07-01 DIAGNOSIS — R059 Cough, unspecified: Secondary | ICD-10-CM | POA: Diagnosis not present

## 2013-07-02 DIAGNOSIS — R222 Localized swelling, mass and lump, trunk: Secondary | ICD-10-CM | POA: Diagnosis not present

## 2013-07-14 DIAGNOSIS — F200 Paranoid schizophrenia: Secondary | ICD-10-CM | POA: Diagnosis not present

## 2013-07-30 DIAGNOSIS — F200 Paranoid schizophrenia: Secondary | ICD-10-CM | POA: Diagnosis not present

## 2013-08-10 DIAGNOSIS — K2100 Gastro-esophageal reflux disease with esophagitis, without bleeding: Secondary | ICD-10-CM | POA: Diagnosis not present

## 2013-08-10 DIAGNOSIS — G4733 Obstructive sleep apnea (adult) (pediatric): Secondary | ICD-10-CM | POA: Diagnosis not present

## 2013-08-13 DIAGNOSIS — B351 Tinea unguium: Secondary | ICD-10-CM | POA: Diagnosis not present

## 2013-08-13 DIAGNOSIS — M79609 Pain in unspecified limb: Secondary | ICD-10-CM | POA: Diagnosis not present

## 2013-08-18 DIAGNOSIS — Z79899 Other long term (current) drug therapy: Secondary | ICD-10-CM | POA: Diagnosis not present

## 2013-08-18 DIAGNOSIS — F259 Schizoaffective disorder, unspecified: Secondary | ICD-10-CM | POA: Diagnosis not present

## 2013-08-28 DIAGNOSIS — F200 Paranoid schizophrenia: Secondary | ICD-10-CM | POA: Diagnosis not present

## 2013-09-17 DIAGNOSIS — F200 Paranoid schizophrenia: Secondary | ICD-10-CM | POA: Diagnosis not present

## 2013-10-05 DIAGNOSIS — G4733 Obstructive sleep apnea (adult) (pediatric): Secondary | ICD-10-CM | POA: Diagnosis not present

## 2013-10-05 DIAGNOSIS — R51 Headache: Secondary | ICD-10-CM | POA: Diagnosis not present

## 2013-10-05 DIAGNOSIS — E038 Other specified hypothyroidism: Secondary | ICD-10-CM | POA: Diagnosis not present

## 2013-10-16 DIAGNOSIS — F200 Paranoid schizophrenia: Secondary | ICD-10-CM | POA: Diagnosis not present

## 2013-10-21 DIAGNOSIS — F250 Schizoaffective disorder, bipolar type: Secondary | ICD-10-CM | POA: Diagnosis not present

## 2013-10-22 DIAGNOSIS — Z79899 Other long term (current) drug therapy: Secondary | ICD-10-CM | POA: Diagnosis not present

## 2013-10-28 DIAGNOSIS — Z79899 Other long term (current) drug therapy: Secondary | ICD-10-CM | POA: Diagnosis not present

## 2013-11-04 DIAGNOSIS — Z79899 Other long term (current) drug therapy: Secondary | ICD-10-CM | POA: Diagnosis not present

## 2013-11-11 DIAGNOSIS — Z79899 Other long term (current) drug therapy: Secondary | ICD-10-CM | POA: Diagnosis not present

## 2013-11-18 DIAGNOSIS — Z5181 Encounter for therapeutic drug level monitoring: Secondary | ICD-10-CM | POA: Diagnosis not present

## 2013-11-18 DIAGNOSIS — F419 Anxiety disorder, unspecified: Secondary | ICD-10-CM | POA: Diagnosis not present

## 2013-11-18 DIAGNOSIS — F250 Schizoaffective disorder, bipolar type: Secondary | ICD-10-CM | POA: Diagnosis not present

## 2013-11-23 ENCOUNTER — Inpatient Hospital Stay: Payer: Self-pay | Admitting: Internal Medicine

## 2013-11-23 DIAGNOSIS — I248 Other forms of acute ischemic heart disease: Secondary | ICD-10-CM | POA: Diagnosis not present

## 2013-11-23 DIAGNOSIS — E119 Type 2 diabetes mellitus without complications: Secondary | ICD-10-CM | POA: Diagnosis not present

## 2013-11-23 DIAGNOSIS — R0602 Shortness of breath: Secondary | ICD-10-CM | POA: Diagnosis not present

## 2013-11-23 DIAGNOSIS — F17210 Nicotine dependence, cigarettes, uncomplicated: Secondary | ICD-10-CM | POA: Diagnosis present

## 2013-11-23 DIAGNOSIS — F200 Paranoid schizophrenia: Secondary | ICD-10-CM | POA: Diagnosis present

## 2013-11-23 DIAGNOSIS — E785 Hyperlipidemia, unspecified: Secondary | ICD-10-CM | POA: Diagnosis present

## 2013-11-23 DIAGNOSIS — J9620 Acute and chronic respiratory failure, unspecified whether with hypoxia or hypercapnia: Secondary | ICD-10-CM | POA: Diagnosis not present

## 2013-11-23 DIAGNOSIS — R918 Other nonspecific abnormal finding of lung field: Secondary | ICD-10-CM | POA: Diagnosis not present

## 2013-11-23 DIAGNOSIS — J9600 Acute respiratory failure, unspecified whether with hypoxia or hypercapnia: Secondary | ICD-10-CM | POA: Diagnosis not present

## 2013-11-23 DIAGNOSIS — R0902 Hypoxemia: Secondary | ICD-10-CM | POA: Diagnosis not present

## 2013-11-23 DIAGNOSIS — J9601 Acute respiratory failure with hypoxia: Secondary | ICD-10-CM | POA: Diagnosis not present

## 2013-11-23 DIAGNOSIS — E1165 Type 2 diabetes mellitus with hyperglycemia: Secondary | ICD-10-CM | POA: Diagnosis not present

## 2013-11-23 DIAGNOSIS — G4733 Obstructive sleep apnea (adult) (pediatric): Secondary | ICD-10-CM | POA: Diagnosis present

## 2013-11-23 DIAGNOSIS — Z6841 Body Mass Index (BMI) 40.0 and over, adult: Secondary | ICD-10-CM | POA: Diagnosis not present

## 2013-11-23 DIAGNOSIS — F609 Personality disorder, unspecified: Secondary | ICD-10-CM | POA: Diagnosis present

## 2013-11-23 DIAGNOSIS — I214 Non-ST elevation (NSTEMI) myocardial infarction: Secondary | ICD-10-CM | POA: Diagnosis not present

## 2013-11-23 DIAGNOSIS — R7989 Other specified abnormal findings of blood chemistry: Secondary | ICD-10-CM | POA: Diagnosis not present

## 2013-11-23 DIAGNOSIS — I509 Heart failure, unspecified: Secondary | ICD-10-CM | POA: Diagnosis not present

## 2013-11-23 DIAGNOSIS — R05 Cough: Secondary | ICD-10-CM | POA: Diagnosis not present

## 2013-11-23 DIAGNOSIS — E039 Hypothyroidism, unspecified: Secondary | ICD-10-CM | POA: Diagnosis present

## 2013-11-23 DIAGNOSIS — J189 Pneumonia, unspecified organism: Secondary | ICD-10-CM | POA: Diagnosis not present

## 2013-11-23 DIAGNOSIS — Z79899 Other long term (current) drug therapy: Secondary | ICD-10-CM | POA: Diagnosis not present

## 2013-11-23 DIAGNOSIS — J45901 Unspecified asthma with (acute) exacerbation: Secondary | ICD-10-CM | POA: Diagnosis not present

## 2013-11-23 DIAGNOSIS — R000 Tachycardia, unspecified: Secondary | ICD-10-CM | POA: Diagnosis present

## 2013-11-23 DIAGNOSIS — Z791 Long term (current) use of non-steroidal anti-inflammatories (NSAID): Secondary | ICD-10-CM | POA: Diagnosis not present

## 2013-11-23 LAB — DRUG SCREEN, URINE
Amphetamines, Ur Screen: NEGATIVE (ref ?–1000)
BARBITURATES, UR SCREEN: NEGATIVE (ref ?–200)
Benzodiazepine, Ur Scrn: NEGATIVE (ref ?–200)
Cannabinoid 50 Ng, Ur ~~LOC~~: NEGATIVE (ref ?–50)
Cocaine Metabolite,Ur ~~LOC~~: NEGATIVE (ref ?–300)
MDMA (Ecstasy)Ur Screen: NEGATIVE (ref ?–500)
METHADONE, UR SCREEN: NEGATIVE (ref ?–300)
Opiate, Ur Screen: POSITIVE (ref ?–300)
PHENCYCLIDINE (PCP) UR S: NEGATIVE (ref ?–25)
TRICYCLIC, UR SCREEN: NEGATIVE (ref ?–1000)

## 2013-11-23 LAB — HEMOGLOBIN A1C: HEMOGLOBIN A1C: 7.2 % — AB (ref 4.2–6.3)

## 2013-11-23 LAB — CK-MB
CK-MB: 1.2 ng/mL (ref 0.5–3.6)
CK-MB: 1.1 ng/mL (ref 0.5–3.6)

## 2013-11-23 LAB — INFLUENZA A,B,H1N1 - PCR (ARMC)
H1N1 flu by pcr: NOT DETECTED
INFLBPCR: NEGATIVE
Influenza A By PCR: NEGATIVE

## 2013-11-23 LAB — TROPONIN I
TROPONIN-I: 0.32 ng/mL — AB
TROPONIN-I: 0.25 ng/mL — AB
Troponin-I: 0.31 ng/mL — ABNORMAL HIGH

## 2013-11-23 LAB — COMPREHENSIVE METABOLIC PANEL
ALBUMIN: 3.3 g/dL — AB (ref 3.4–5.0)
ALT: 44 U/L
ANION GAP: 9 (ref 7–16)
Alkaline Phosphatase: 60 U/L
BUN: 16 mg/dL (ref 7–18)
Bilirubin,Total: 0.4 mg/dL (ref 0.2–1.0)
CREATININE: 1.26 mg/dL (ref 0.60–1.30)
Calcium, Total: 7.7 mg/dL — ABNORMAL LOW (ref 8.5–10.1)
Chloride: 99 mmol/L (ref 98–107)
Co2: 29 mmol/L (ref 21–32)
EGFR (African American): >60
EGFR (Non-African Amer.): >60
GLUCOSE: 148 mg/dL — AB (ref 65–99)
OSMOLALITY: 278 (ref 275–301)
Potassium: 3.8 mmol/L (ref 3.5–5.1)
SGOT(AST): 44 U/L — ABNORMAL HIGH (ref 15–37)
SODIUM: 137 mmol/L (ref 136–145)
Total Protein: 8.8 g/dL — ABNORMAL HIGH (ref 6.4–8.2)

## 2013-11-23 LAB — CBC
HCT: 42.5 % (ref 40.0–52.0)
HGB: 13.4 g/dL (ref 13.0–18.0)
MCH: 27.6 pg (ref 26.0–34.0)
MCHC: 31.6 g/dL — ABNORMAL LOW (ref 32.0–36.0)
MCV: 87 fL (ref 80–100)
Platelet: 185 10*3/uL (ref 150–440)
RBC: 4.87 10*6/uL (ref 4.40–5.90)
RDW: 14.5 % (ref 11.5–14.5)
WBC: 9.2 10*3/uL (ref 3.8–10.6)

## 2013-11-23 LAB — CK TOTAL AND CKMB (NOT AT ARMC)
CK, Total: 425 U/L — ABNORMAL HIGH (ref 39–308)
CK-MB: 1.3 ng/mL (ref 0.5–3.6)

## 2013-11-23 LAB — DIFFERENTIAL
BASOS PCT: 0.5 %
Basophil #: 0 10*3/uL (ref 0.0–0.1)
EOS PCT: 0 %
Eosinophil #: 0 10*3/uL (ref 0.0–0.7)
LYMPHS ABS: 1.4 10*3/uL (ref 1.0–3.6)
Lymphocyte %: 15 %
MONO ABS: 1.3 x10 3/mm — AB (ref 0.2–1.0)
MONOS PCT: 14.3 %
Neutrophil #: 6.5 10*3/uL (ref 1.4–6.5)
Neutrophil %: 70.2 %

## 2013-11-24 LAB — LIPID PANEL
Cholesterol: 146 mg/dL (ref 0–200)
HDL Cholesterol: 22 mg/dL — ABNORMAL LOW (ref 40–60)
Ldl Cholesterol, Calc: 94 mg/dL (ref 0–100)
Triglycerides: 152 mg/dL (ref 0–200)
VLDL Cholesterol, Calc: 30 mg/dL (ref 5–40)

## 2013-11-24 LAB — BASIC METABOLIC PANEL
Anion Gap: 4 — ABNORMAL LOW (ref 7–16)
BUN: 14 mg/dL (ref 7–18)
CO2: 34 mmol/L — AB (ref 21–32)
Calcium, Total: 8.4 mg/dL — ABNORMAL LOW (ref 8.5–10.1)
Chloride: 98 mmol/L (ref 98–107)
Creatinine: 1.04 mg/dL (ref 0.60–1.30)
EGFR (Non-African Amer.): >60
GLUCOSE: 218 mg/dL — AB (ref 65–99)
OSMOLALITY: 279 (ref 275–301)
POTASSIUM: 4.8 mmol/L (ref 3.5–5.1)
Sodium: 136 mmol/L (ref 136–145)

## 2013-11-24 LAB — CBC WITH DIFFERENTIAL/PLATELET
BASOS PCT: 0.3 %
Basophil #: 0 10*3/uL (ref 0.0–0.1)
Eosinophil #: 0 10*3/uL (ref 0.0–0.7)
Eosinophil %: 0.1 %
HCT: 39 % — ABNORMAL LOW (ref 40.0–52.0)
HGB: 12.3 g/dL — ABNORMAL LOW (ref 13.0–18.0)
Lymphocyte #: 0.8 10*3/uL — ABNORMAL LOW (ref 1.0–3.6)
Lymphocyte %: 10.4 %
MCH: 27.5 pg (ref 26.0–34.0)
MCHC: 31.5 g/dL — AB (ref 32.0–36.0)
MCV: 87 fL (ref 80–100)
MONO ABS: 0.5 x10 3/mm (ref 0.2–1.0)
Monocyte %: 6.8 %
Neutrophil #: 6.6 10*3/uL — ABNORMAL HIGH (ref 1.4–6.5)
Neutrophil %: 82.4 %
Platelet: 184 10*3/uL (ref 150–440)
RBC: 4.48 10*6/uL (ref 4.40–5.90)
RDW: 14.3 % (ref 11.5–14.5)
WBC: 8 10*3/uL (ref 3.8–10.6)

## 2013-11-24 LAB — TSH: THYROID STIMULATING HORM: 0.336 u[IU]/mL — AB

## 2013-11-25 DIAGNOSIS — J45901 Unspecified asthma with (acute) exacerbation: Secondary | ICD-10-CM | POA: Diagnosis not present

## 2013-11-25 DIAGNOSIS — J9600 Acute respiratory failure, unspecified whether with hypoxia or hypercapnia: Secondary | ICD-10-CM | POA: Diagnosis not present

## 2013-11-25 DIAGNOSIS — J189 Pneumonia, unspecified organism: Secondary | ICD-10-CM | POA: Diagnosis not present

## 2013-11-25 LAB — CBC WITH DIFFERENTIAL/PLATELET
BASOS ABS: 0.1 10*3/uL (ref 0.0–0.1)
BASOS PCT: 0.6 %
EOS ABS: 0 10*3/uL (ref 0.0–0.7)
Eosinophil %: 0.1 %
HCT: 43 % (ref 40.0–52.0)
HGB: 13.5 g/dL (ref 13.0–18.0)
LYMPHS ABS: 1.2 10*3/uL (ref 1.0–3.6)
LYMPHS PCT: 9.4 %
MCH: 27.6 pg (ref 26.0–34.0)
MCHC: 31.5 g/dL — ABNORMAL LOW (ref 32.0–36.0)
MCV: 88 fL (ref 80–100)
MONO ABS: 1 x10 3/mm (ref 0.2–1.0)
Monocyte %: 8.2 %
Neutrophil #: 10.2 10*3/uL — ABNORMAL HIGH (ref 1.4–6.5)
Neutrophil %: 81.7 %
Platelet: 217 10*3/uL (ref 150–440)
RBC: 4.89 10*6/uL (ref 4.40–5.90)
RDW: 14.2 % (ref 11.5–14.5)
WBC: 12.5 10*3/uL — AB (ref 3.8–10.6)

## 2013-11-25 LAB — BASIC METABOLIC PANEL
ANION GAP: 7 (ref 7–16)
BUN: 21 mg/dL — AB (ref 7–18)
Calcium, Total: 8.3 mg/dL — ABNORMAL LOW (ref 8.5–10.1)
Chloride: 99 mmol/L (ref 98–107)
Co2: 30 mmol/L (ref 21–32)
Creatinine: 1.03 mg/dL (ref 0.60–1.30)
EGFR (African American): >60
EGFR (Non-African Amer.): >60
Glucose: 226 mg/dL — ABNORMAL HIGH (ref 65–99)
Osmolality: 282 (ref 275–301)
POTASSIUM: 5.3 mmol/L — AB (ref 3.5–5.1)
Sodium: 136 mmol/L (ref 136–145)

## 2013-11-26 LAB — CBC WITH DIFFERENTIAL/PLATELET
Basophil #: 0.1 10*3/uL (ref 0.0–0.1)
Basophil %: 0.7 %
EOS ABS: 0 10*3/uL (ref 0.0–0.7)
Eosinophil %: 0.1 %
HCT: 37.9 % — ABNORMAL LOW (ref 40.0–52.0)
HGB: 12 g/dL — ABNORMAL LOW (ref 13.0–18.0)
LYMPHS ABS: 1.7 10*3/uL (ref 1.0–3.6)
Lymphocyte %: 16.4 %
MCH: 27.7 pg (ref 26.0–34.0)
MCHC: 31.7 g/dL — AB (ref 32.0–36.0)
MCV: 87 fL (ref 80–100)
Monocyte #: 1 x10 3/mm (ref 0.2–1.0)
Monocyte %: 9.9 %
NEUTROS ABS: 7.4 10*3/uL — AB (ref 1.4–6.5)
NEUTROS PCT: 72.9 %
Platelet: 224 10*3/uL (ref 150–440)
RBC: 4.34 10*6/uL — AB (ref 4.40–5.90)
RDW: 14.1 % (ref 11.5–14.5)
WBC: 10.1 10*3/uL (ref 3.8–10.6)

## 2013-11-26 LAB — BASIC METABOLIC PANEL
Anion Gap: 5 — ABNORMAL LOW (ref 7–16)
BUN: 25 mg/dL — ABNORMAL HIGH (ref 7–18)
CALCIUM: 8.1 mg/dL — AB (ref 8.5–10.1)
Chloride: 95 mmol/L — ABNORMAL LOW (ref 98–107)
Co2: 34 mmol/L — ABNORMAL HIGH (ref 21–32)
Creatinine: 1.04 mg/dL (ref 0.60–1.30)
EGFR (African American): >60
GLUCOSE: 308 mg/dL — AB (ref 65–99)
Osmolality: 284 (ref 275–301)
POTASSIUM: 5.1 mmol/L (ref 3.5–5.1)
SODIUM: 134 mmol/L — AB (ref 136–145)

## 2013-11-27 LAB — EXPECTORATED SPUTUM ASSESSMENT W REFEX TO RESP CULTURE

## 2013-11-28 LAB — CULTURE, BLOOD (SINGLE)

## 2013-11-30 DIAGNOSIS — F200 Paranoid schizophrenia: Secondary | ICD-10-CM | POA: Diagnosis not present

## 2013-11-30 DIAGNOSIS — E1165 Type 2 diabetes mellitus with hyperglycemia: Secondary | ICD-10-CM | POA: Diagnosis not present

## 2013-11-30 DIAGNOSIS — F17200 Nicotine dependence, unspecified, uncomplicated: Secondary | ICD-10-CM | POA: Diagnosis not present

## 2013-11-30 DIAGNOSIS — B349 Viral infection, unspecified: Secondary | ICD-10-CM | POA: Diagnosis not present

## 2013-11-30 DIAGNOSIS — J189 Pneumonia, unspecified organism: Secondary | ICD-10-CM | POA: Diagnosis not present

## 2013-11-30 DIAGNOSIS — J45901 Unspecified asthma with (acute) exacerbation: Secondary | ICD-10-CM | POA: Diagnosis not present

## 2013-12-02 DIAGNOSIS — F419 Anxiety disorder, unspecified: Secondary | ICD-10-CM | POA: Diagnosis not present

## 2013-12-02 DIAGNOSIS — Z79899 Other long term (current) drug therapy: Secondary | ICD-10-CM | POA: Diagnosis not present

## 2013-12-02 DIAGNOSIS — Z5181 Encounter for therapeutic drug level monitoring: Secondary | ICD-10-CM | POA: Diagnosis not present

## 2013-12-04 DIAGNOSIS — F200 Paranoid schizophrenia: Secondary | ICD-10-CM | POA: Diagnosis not present

## 2013-12-04 DIAGNOSIS — E1165 Type 2 diabetes mellitus with hyperglycemia: Secondary | ICD-10-CM | POA: Diagnosis not present

## 2013-12-04 DIAGNOSIS — J45901 Unspecified asthma with (acute) exacerbation: Secondary | ICD-10-CM | POA: Diagnosis not present

## 2013-12-04 DIAGNOSIS — J189 Pneumonia, unspecified organism: Secondary | ICD-10-CM | POA: Diagnosis not present

## 2013-12-04 DIAGNOSIS — F17200 Nicotine dependence, unspecified, uncomplicated: Secondary | ICD-10-CM | POA: Diagnosis not present

## 2013-12-07 DIAGNOSIS — Z79899 Other long term (current) drug therapy: Secondary | ICD-10-CM | POA: Diagnosis not present

## 2013-12-07 DIAGNOSIS — F259 Schizoaffective disorder, unspecified: Secondary | ICD-10-CM | POA: Diagnosis not present

## 2013-12-07 DIAGNOSIS — Z5181 Encounter for therapeutic drug level monitoring: Secondary | ICD-10-CM | POA: Diagnosis not present

## 2013-12-07 DIAGNOSIS — F419 Anxiety disorder, unspecified: Secondary | ICD-10-CM | POA: Diagnosis not present

## 2013-12-08 DIAGNOSIS — F200 Paranoid schizophrenia: Secondary | ICD-10-CM | POA: Diagnosis not present

## 2013-12-08 DIAGNOSIS — E1165 Type 2 diabetes mellitus with hyperglycemia: Secondary | ICD-10-CM | POA: Diagnosis not present

## 2013-12-08 DIAGNOSIS — J45901 Unspecified asthma with (acute) exacerbation: Secondary | ICD-10-CM | POA: Diagnosis not present

## 2013-12-08 DIAGNOSIS — F17200 Nicotine dependence, unspecified, uncomplicated: Secondary | ICD-10-CM | POA: Diagnosis not present

## 2013-12-08 DIAGNOSIS — J189 Pneumonia, unspecified organism: Secondary | ICD-10-CM | POA: Diagnosis not present

## 2013-12-11 DIAGNOSIS — J45901 Unspecified asthma with (acute) exacerbation: Secondary | ICD-10-CM | POA: Diagnosis not present

## 2013-12-11 DIAGNOSIS — J189 Pneumonia, unspecified organism: Secondary | ICD-10-CM | POA: Diagnosis not present

## 2013-12-11 DIAGNOSIS — E1165 Type 2 diabetes mellitus with hyperglycemia: Secondary | ICD-10-CM | POA: Diagnosis not present

## 2013-12-11 DIAGNOSIS — F17200 Nicotine dependence, unspecified, uncomplicated: Secondary | ICD-10-CM | POA: Diagnosis not present

## 2013-12-11 DIAGNOSIS — F200 Paranoid schizophrenia: Secondary | ICD-10-CM | POA: Diagnosis not present

## 2013-12-12 DIAGNOSIS — E1165 Type 2 diabetes mellitus with hyperglycemia: Secondary | ICD-10-CM | POA: Diagnosis not present

## 2013-12-12 DIAGNOSIS — J189 Pneumonia, unspecified organism: Secondary | ICD-10-CM | POA: Diagnosis not present

## 2013-12-12 DIAGNOSIS — F17200 Nicotine dependence, unspecified, uncomplicated: Secondary | ICD-10-CM | POA: Diagnosis not present

## 2013-12-12 DIAGNOSIS — F200 Paranoid schizophrenia: Secondary | ICD-10-CM | POA: Diagnosis not present

## 2013-12-12 DIAGNOSIS — J45901 Unspecified asthma with (acute) exacerbation: Secondary | ICD-10-CM | POA: Diagnosis not present

## 2013-12-14 DIAGNOSIS — B349 Viral infection, unspecified: Secondary | ICD-10-CM | POA: Diagnosis not present

## 2013-12-14 DIAGNOSIS — R05 Cough: Secondary | ICD-10-CM | POA: Diagnosis not present

## 2013-12-14 DIAGNOSIS — E038 Other specified hypothyroidism: Secondary | ICD-10-CM | POA: Diagnosis not present

## 2013-12-14 DIAGNOSIS — J9801 Acute bronchospasm: Secondary | ICD-10-CM | POA: Diagnosis not present

## 2013-12-15 DIAGNOSIS — J45901 Unspecified asthma with (acute) exacerbation: Secondary | ICD-10-CM | POA: Diagnosis not present

## 2013-12-15 DIAGNOSIS — F17200 Nicotine dependence, unspecified, uncomplicated: Secondary | ICD-10-CM | POA: Diagnosis not present

## 2013-12-15 DIAGNOSIS — F200 Paranoid schizophrenia: Secondary | ICD-10-CM | POA: Diagnosis not present

## 2013-12-15 DIAGNOSIS — E1165 Type 2 diabetes mellitus with hyperglycemia: Secondary | ICD-10-CM | POA: Diagnosis not present

## 2013-12-15 DIAGNOSIS — J189 Pneumonia, unspecified organism: Secondary | ICD-10-CM | POA: Diagnosis not present

## 2013-12-16 DIAGNOSIS — F419 Anxiety disorder, unspecified: Secondary | ICD-10-CM | POA: Diagnosis not present

## 2013-12-16 DIAGNOSIS — Z79899 Other long term (current) drug therapy: Secondary | ICD-10-CM | POA: Diagnosis not present

## 2013-12-17 DIAGNOSIS — F250 Schizoaffective disorder, bipolar type: Secondary | ICD-10-CM | POA: Diagnosis not present

## 2013-12-18 DIAGNOSIS — E1165 Type 2 diabetes mellitus with hyperglycemia: Secondary | ICD-10-CM | POA: Diagnosis not present

## 2013-12-18 DIAGNOSIS — F200 Paranoid schizophrenia: Secondary | ICD-10-CM | POA: Diagnosis not present

## 2013-12-18 DIAGNOSIS — J45901 Unspecified asthma with (acute) exacerbation: Secondary | ICD-10-CM | POA: Diagnosis not present

## 2013-12-18 DIAGNOSIS — F17200 Nicotine dependence, unspecified, uncomplicated: Secondary | ICD-10-CM | POA: Diagnosis not present

## 2013-12-18 DIAGNOSIS — J189 Pneumonia, unspecified organism: Secondary | ICD-10-CM | POA: Diagnosis not present

## 2013-12-22 DIAGNOSIS — F200 Paranoid schizophrenia: Secondary | ICD-10-CM | POA: Diagnosis not present

## 2013-12-22 DIAGNOSIS — F17200 Nicotine dependence, unspecified, uncomplicated: Secondary | ICD-10-CM | POA: Diagnosis not present

## 2013-12-22 DIAGNOSIS — E1165 Type 2 diabetes mellitus with hyperglycemia: Secondary | ICD-10-CM | POA: Diagnosis not present

## 2013-12-22 DIAGNOSIS — J45901 Unspecified asthma with (acute) exacerbation: Secondary | ICD-10-CM | POA: Diagnosis not present

## 2013-12-22 DIAGNOSIS — J189 Pneumonia, unspecified organism: Secondary | ICD-10-CM | POA: Diagnosis not present

## 2013-12-30 DIAGNOSIS — E1165 Type 2 diabetes mellitus with hyperglycemia: Secondary | ICD-10-CM | POA: Diagnosis not present

## 2013-12-30 DIAGNOSIS — J45901 Unspecified asthma with (acute) exacerbation: Secondary | ICD-10-CM | POA: Diagnosis not present

## 2013-12-30 DIAGNOSIS — F419 Anxiety disorder, unspecified: Secondary | ICD-10-CM | POA: Diagnosis not present

## 2013-12-30 DIAGNOSIS — J189 Pneumonia, unspecified organism: Secondary | ICD-10-CM | POA: Diagnosis not present

## 2013-12-30 DIAGNOSIS — F200 Paranoid schizophrenia: Secondary | ICD-10-CM | POA: Diagnosis not present

## 2013-12-30 DIAGNOSIS — F17200 Nicotine dependence, unspecified, uncomplicated: Secondary | ICD-10-CM | POA: Diagnosis not present

## 2014-01-02 DIAGNOSIS — F17200 Nicotine dependence, unspecified, uncomplicated: Secondary | ICD-10-CM | POA: Diagnosis not present

## 2014-01-02 DIAGNOSIS — E1165 Type 2 diabetes mellitus with hyperglycemia: Secondary | ICD-10-CM | POA: Diagnosis not present

## 2014-01-02 DIAGNOSIS — L409 Psoriasis, unspecified: Secondary | ICD-10-CM | POA: Diagnosis not present

## 2014-01-02 DIAGNOSIS — J45901 Unspecified asthma with (acute) exacerbation: Secondary | ICD-10-CM | POA: Diagnosis not present

## 2014-01-02 DIAGNOSIS — F200 Paranoid schizophrenia: Secondary | ICD-10-CM | POA: Diagnosis not present

## 2014-01-02 DIAGNOSIS — J189 Pneumonia, unspecified organism: Secondary | ICD-10-CM | POA: Diagnosis not present

## 2014-01-04 DIAGNOSIS — J45901 Unspecified asthma with (acute) exacerbation: Secondary | ICD-10-CM | POA: Diagnosis not present

## 2014-01-04 DIAGNOSIS — J189 Pneumonia, unspecified organism: Secondary | ICD-10-CM | POA: Diagnosis not present

## 2014-01-04 DIAGNOSIS — F17200 Nicotine dependence, unspecified, uncomplicated: Secondary | ICD-10-CM | POA: Diagnosis not present

## 2014-01-04 DIAGNOSIS — E1165 Type 2 diabetes mellitus with hyperglycemia: Secondary | ICD-10-CM | POA: Diagnosis not present

## 2014-01-04 DIAGNOSIS — F200 Paranoid schizophrenia: Secondary | ICD-10-CM | POA: Diagnosis not present

## 2014-01-06 DIAGNOSIS — F419 Anxiety disorder, unspecified: Secondary | ICD-10-CM | POA: Diagnosis not present

## 2014-01-13 DIAGNOSIS — F200 Paranoid schizophrenia: Secondary | ICD-10-CM | POA: Diagnosis not present

## 2014-01-13 DIAGNOSIS — J45901 Unspecified asthma with (acute) exacerbation: Secondary | ICD-10-CM | POA: Diagnosis not present

## 2014-01-13 DIAGNOSIS — E1165 Type 2 diabetes mellitus with hyperglycemia: Secondary | ICD-10-CM | POA: Diagnosis not present

## 2014-01-13 DIAGNOSIS — J189 Pneumonia, unspecified organism: Secondary | ICD-10-CM | POA: Diagnosis not present

## 2014-01-13 DIAGNOSIS — F17200 Nicotine dependence, unspecified, uncomplicated: Secondary | ICD-10-CM | POA: Diagnosis not present

## 2014-01-13 DIAGNOSIS — F419 Anxiety disorder, unspecified: Secondary | ICD-10-CM | POA: Diagnosis not present

## 2014-01-14 ENCOUNTER — Ambulatory Visit: Payer: Self-pay | Admitting: Internal Medicine

## 2014-01-14 DIAGNOSIS — E119 Type 2 diabetes mellitus without complications: Secondary | ICD-10-CM | POA: Diagnosis not present

## 2014-01-14 DIAGNOSIS — I517 Cardiomegaly: Secondary | ICD-10-CM | POA: Diagnosis not present

## 2014-01-14 DIAGNOSIS — I509 Heart failure, unspecified: Secondary | ICD-10-CM | POA: Diagnosis not present

## 2014-01-14 DIAGNOSIS — H6092 Unspecified otitis externa, left ear: Secondary | ICD-10-CM | POA: Diagnosis not present

## 2014-01-14 DIAGNOSIS — R0602 Shortness of breath: Secondary | ICD-10-CM | POA: Diagnosis not present

## 2014-01-14 DIAGNOSIS — E038 Other specified hypothyroidism: Secondary | ICD-10-CM | POA: Diagnosis not present

## 2014-01-14 DIAGNOSIS — R05 Cough: Secondary | ICD-10-CM | POA: Diagnosis not present

## 2014-01-14 DIAGNOSIS — K210 Gastro-esophageal reflux disease with esophagitis: Secondary | ICD-10-CM | POA: Diagnosis not present

## 2014-01-14 DIAGNOSIS — R0989 Other specified symptoms and signs involving the circulatory and respiratory systems: Secondary | ICD-10-CM | POA: Diagnosis not present

## 2014-01-15 DIAGNOSIS — F250 Schizoaffective disorder, bipolar type: Secondary | ICD-10-CM | POA: Diagnosis not present

## 2014-01-18 DIAGNOSIS — J45901 Unspecified asthma with (acute) exacerbation: Secondary | ICD-10-CM | POA: Diagnosis not present

## 2014-01-18 DIAGNOSIS — E1165 Type 2 diabetes mellitus with hyperglycemia: Secondary | ICD-10-CM | POA: Diagnosis not present

## 2014-01-18 DIAGNOSIS — F17200 Nicotine dependence, unspecified, uncomplicated: Secondary | ICD-10-CM | POA: Diagnosis not present

## 2014-01-18 DIAGNOSIS — J189 Pneumonia, unspecified organism: Secondary | ICD-10-CM | POA: Diagnosis not present

## 2014-01-18 DIAGNOSIS — F200 Paranoid schizophrenia: Secondary | ICD-10-CM | POA: Diagnosis not present

## 2014-01-20 DIAGNOSIS — F419 Anxiety disorder, unspecified: Secondary | ICD-10-CM | POA: Diagnosis not present

## 2014-01-21 DIAGNOSIS — G4733 Obstructive sleep apnea (adult) (pediatric): Secondary | ICD-10-CM | POA: Diagnosis not present

## 2014-01-21 DIAGNOSIS — K210 Gastro-esophageal reflux disease with esophagitis: Secondary | ICD-10-CM | POA: Diagnosis not present

## 2014-01-21 DIAGNOSIS — M79674 Pain in right toe(s): Secondary | ICD-10-CM | POA: Diagnosis not present

## 2014-01-21 DIAGNOSIS — B351 Tinea unguium: Secondary | ICD-10-CM | POA: Diagnosis not present

## 2014-01-21 DIAGNOSIS — E668 Other obesity: Secondary | ICD-10-CM | POA: Diagnosis not present

## 2014-01-21 DIAGNOSIS — M79675 Pain in left toe(s): Secondary | ICD-10-CM | POA: Diagnosis not present

## 2014-01-26 DIAGNOSIS — J189 Pneumonia, unspecified organism: Secondary | ICD-10-CM | POA: Diagnosis not present

## 2014-01-26 DIAGNOSIS — F17200 Nicotine dependence, unspecified, uncomplicated: Secondary | ICD-10-CM | POA: Diagnosis not present

## 2014-01-26 DIAGNOSIS — J45901 Unspecified asthma with (acute) exacerbation: Secondary | ICD-10-CM | POA: Diagnosis not present

## 2014-01-26 DIAGNOSIS — E1165 Type 2 diabetes mellitus with hyperglycemia: Secondary | ICD-10-CM | POA: Diagnosis not present

## 2014-01-26 DIAGNOSIS — F200 Paranoid schizophrenia: Secondary | ICD-10-CM | POA: Diagnosis not present

## 2014-01-27 DIAGNOSIS — F419 Anxiety disorder, unspecified: Secondary | ICD-10-CM | POA: Diagnosis not present

## 2014-01-27 DIAGNOSIS — F250 Schizoaffective disorder, bipolar type: Secondary | ICD-10-CM | POA: Diagnosis not present

## 2014-01-29 DIAGNOSIS — F17200 Nicotine dependence, unspecified, uncomplicated: Secondary | ICD-10-CM | POA: Diagnosis not present

## 2014-01-29 DIAGNOSIS — J45901 Unspecified asthma with (acute) exacerbation: Secondary | ICD-10-CM | POA: Diagnosis not present

## 2014-01-29 DIAGNOSIS — J189 Pneumonia, unspecified organism: Secondary | ICD-10-CM | POA: Diagnosis not present

## 2014-01-29 DIAGNOSIS — H6012 Cellulitis of left external ear: Secondary | ICD-10-CM | POA: Diagnosis not present

## 2014-01-29 DIAGNOSIS — E1165 Type 2 diabetes mellitus with hyperglycemia: Secondary | ICD-10-CM | POA: Diagnosis not present

## 2014-01-29 DIAGNOSIS — F200 Paranoid schizophrenia: Secondary | ICD-10-CM | POA: Diagnosis not present

## 2014-02-04 DIAGNOSIS — F419 Anxiety disorder, unspecified: Secondary | ICD-10-CM | POA: Diagnosis not present

## 2014-02-04 DIAGNOSIS — F250 Schizoaffective disorder, bipolar type: Secondary | ICD-10-CM | POA: Diagnosis not present

## 2014-02-05 ENCOUNTER — Encounter: Payer: Self-pay | Admitting: Internal Medicine

## 2014-02-06 DIAGNOSIS — E1165 Type 2 diabetes mellitus with hyperglycemia: Secondary | ICD-10-CM | POA: Diagnosis not present

## 2014-02-06 DIAGNOSIS — J45901 Unspecified asthma with (acute) exacerbation: Secondary | ICD-10-CM | POA: Diagnosis not present

## 2014-02-06 DIAGNOSIS — F200 Paranoid schizophrenia: Secondary | ICD-10-CM | POA: Diagnosis not present

## 2014-02-06 DIAGNOSIS — F17200 Nicotine dependence, unspecified, uncomplicated: Secondary | ICD-10-CM | POA: Diagnosis not present

## 2014-02-06 DIAGNOSIS — J189 Pneumonia, unspecified organism: Secondary | ICD-10-CM | POA: Diagnosis not present

## 2014-02-06 DIAGNOSIS — H6012 Cellulitis of left external ear: Secondary | ICD-10-CM | POA: Diagnosis not present

## 2014-02-10 DIAGNOSIS — F17200 Nicotine dependence, unspecified, uncomplicated: Secondary | ICD-10-CM | POA: Diagnosis not present

## 2014-02-10 DIAGNOSIS — F200 Paranoid schizophrenia: Secondary | ICD-10-CM | POA: Diagnosis not present

## 2014-02-10 DIAGNOSIS — H6012 Cellulitis of left external ear: Secondary | ICD-10-CM | POA: Diagnosis not present

## 2014-02-10 DIAGNOSIS — J189 Pneumonia, unspecified organism: Secondary | ICD-10-CM | POA: Diagnosis not present

## 2014-02-10 DIAGNOSIS — E1165 Type 2 diabetes mellitus with hyperglycemia: Secondary | ICD-10-CM | POA: Diagnosis not present

## 2014-02-10 DIAGNOSIS — J45901 Unspecified asthma with (acute) exacerbation: Secondary | ICD-10-CM | POA: Diagnosis not present

## 2014-02-15 DIAGNOSIS — H6012 Cellulitis of left external ear: Secondary | ICD-10-CM | POA: Diagnosis not present

## 2014-02-15 DIAGNOSIS — J45901 Unspecified asthma with (acute) exacerbation: Secondary | ICD-10-CM | POA: Diagnosis not present

## 2014-02-15 DIAGNOSIS — J189 Pneumonia, unspecified organism: Secondary | ICD-10-CM | POA: Diagnosis not present

## 2014-02-15 DIAGNOSIS — F17200 Nicotine dependence, unspecified, uncomplicated: Secondary | ICD-10-CM | POA: Diagnosis not present

## 2014-02-15 DIAGNOSIS — F200 Paranoid schizophrenia: Secondary | ICD-10-CM | POA: Diagnosis not present

## 2014-02-15 DIAGNOSIS — E1165 Type 2 diabetes mellitus with hyperglycemia: Secondary | ICD-10-CM | POA: Diagnosis not present

## 2014-02-16 DIAGNOSIS — F250 Schizoaffective disorder, bipolar type: Secondary | ICD-10-CM | POA: Diagnosis not present

## 2014-02-23 DIAGNOSIS — F200 Paranoid schizophrenia: Secondary | ICD-10-CM | POA: Diagnosis not present

## 2014-02-23 DIAGNOSIS — H6012 Cellulitis of left external ear: Secondary | ICD-10-CM | POA: Diagnosis not present

## 2014-02-23 DIAGNOSIS — J189 Pneumonia, unspecified organism: Secondary | ICD-10-CM | POA: Diagnosis not present

## 2014-02-23 DIAGNOSIS — J45901 Unspecified asthma with (acute) exacerbation: Secondary | ICD-10-CM | POA: Diagnosis not present

## 2014-02-23 DIAGNOSIS — F17200 Nicotine dependence, unspecified, uncomplicated: Secondary | ICD-10-CM | POA: Diagnosis not present

## 2014-02-23 DIAGNOSIS — E1165 Type 2 diabetes mellitus with hyperglycemia: Secondary | ICD-10-CM | POA: Diagnosis not present

## 2014-03-02 DIAGNOSIS — F259 Schizoaffective disorder, unspecified: Secondary | ICD-10-CM | POA: Diagnosis not present

## 2014-03-12 DIAGNOSIS — J45901 Unspecified asthma with (acute) exacerbation: Secondary | ICD-10-CM | POA: Diagnosis not present

## 2014-03-12 DIAGNOSIS — R05 Cough: Secondary | ICD-10-CM | POA: Diagnosis not present

## 2014-03-12 DIAGNOSIS — F200 Paranoid schizophrenia: Secondary | ICD-10-CM | POA: Diagnosis not present

## 2014-03-12 DIAGNOSIS — J189 Pneumonia, unspecified organism: Secondary | ICD-10-CM | POA: Diagnosis not present

## 2014-03-12 DIAGNOSIS — F17200 Nicotine dependence, unspecified, uncomplicated: Secondary | ICD-10-CM | POA: Diagnosis not present

## 2014-03-12 DIAGNOSIS — H6012 Cellulitis of left external ear: Secondary | ICD-10-CM | POA: Diagnosis not present

## 2014-03-12 DIAGNOSIS — E1165 Type 2 diabetes mellitus with hyperglycemia: Secondary | ICD-10-CM | POA: Diagnosis not present

## 2014-03-15 DIAGNOSIS — R05 Cough: Secondary | ICD-10-CM | POA: Diagnosis not present

## 2014-03-15 DIAGNOSIS — L503 Dermatographic urticaria: Secondary | ICD-10-CM | POA: Diagnosis not present

## 2014-03-16 DIAGNOSIS — F200 Paranoid schizophrenia: Secondary | ICD-10-CM | POA: Diagnosis not present

## 2014-03-16 DIAGNOSIS — E1165 Type 2 diabetes mellitus with hyperglycemia: Secondary | ICD-10-CM | POA: Diagnosis not present

## 2014-03-16 DIAGNOSIS — G4733 Obstructive sleep apnea (adult) (pediatric): Secondary | ICD-10-CM | POA: Diagnosis not present

## 2014-03-16 DIAGNOSIS — J189 Pneumonia, unspecified organism: Secondary | ICD-10-CM | POA: Diagnosis not present

## 2014-03-16 DIAGNOSIS — F259 Schizoaffective disorder, unspecified: Secondary | ICD-10-CM | POA: Diagnosis not present

## 2014-03-16 DIAGNOSIS — F250 Schizoaffective disorder, bipolar type: Secondary | ICD-10-CM | POA: Diagnosis not present

## 2014-03-16 DIAGNOSIS — J45901 Unspecified asthma with (acute) exacerbation: Secondary | ICD-10-CM | POA: Diagnosis not present

## 2014-03-16 DIAGNOSIS — F17200 Nicotine dependence, unspecified, uncomplicated: Secondary | ICD-10-CM | POA: Diagnosis not present

## 2014-03-16 DIAGNOSIS — H6012 Cellulitis of left external ear: Secondary | ICD-10-CM | POA: Diagnosis not present

## 2014-03-18 DIAGNOSIS — J189 Pneumonia, unspecified organism: Secondary | ICD-10-CM | POA: Diagnosis not present

## 2014-03-18 DIAGNOSIS — H6012 Cellulitis of left external ear: Secondary | ICD-10-CM | POA: Diagnosis not present

## 2014-03-18 DIAGNOSIS — F17200 Nicotine dependence, unspecified, uncomplicated: Secondary | ICD-10-CM | POA: Diagnosis not present

## 2014-03-18 DIAGNOSIS — F200 Paranoid schizophrenia: Secondary | ICD-10-CM | POA: Diagnosis not present

## 2014-03-18 DIAGNOSIS — J45901 Unspecified asthma with (acute) exacerbation: Secondary | ICD-10-CM | POA: Diagnosis not present

## 2014-03-18 DIAGNOSIS — F250 Schizoaffective disorder, bipolar type: Secondary | ICD-10-CM | POA: Diagnosis not present

## 2014-03-18 DIAGNOSIS — E1165 Type 2 diabetes mellitus with hyperglycemia: Secondary | ICD-10-CM | POA: Diagnosis not present

## 2014-03-22 ENCOUNTER — Emergency Department: Payer: Self-pay | Admitting: Emergency Medicine

## 2014-03-22 DIAGNOSIS — J189 Pneumonia, unspecified organism: Secondary | ICD-10-CM | POA: Diagnosis not present

## 2014-03-22 DIAGNOSIS — H6012 Cellulitis of left external ear: Secondary | ICD-10-CM | POA: Diagnosis not present

## 2014-03-22 DIAGNOSIS — F17200 Nicotine dependence, unspecified, uncomplicated: Secondary | ICD-10-CM | POA: Diagnosis not present

## 2014-03-22 DIAGNOSIS — R0602 Shortness of breath: Secondary | ICD-10-CM | POA: Diagnosis not present

## 2014-03-22 DIAGNOSIS — F200 Paranoid schizophrenia: Secondary | ICD-10-CM | POA: Diagnosis not present

## 2014-03-22 DIAGNOSIS — E1165 Type 2 diabetes mellitus with hyperglycemia: Secondary | ICD-10-CM | POA: Diagnosis not present

## 2014-03-22 DIAGNOSIS — E119 Type 2 diabetes mellitus without complications: Secondary | ICD-10-CM | POA: Diagnosis not present

## 2014-03-22 DIAGNOSIS — J45901 Unspecified asthma with (acute) exacerbation: Secondary | ICD-10-CM | POA: Diagnosis not present

## 2014-03-23 DIAGNOSIS — F200 Paranoid schizophrenia: Secondary | ICD-10-CM | POA: Diagnosis not present

## 2014-03-23 DIAGNOSIS — R0902 Hypoxemia: Secondary | ICD-10-CM | POA: Diagnosis not present

## 2014-03-23 DIAGNOSIS — H6012 Cellulitis of left external ear: Secondary | ICD-10-CM | POA: Diagnosis not present

## 2014-03-23 DIAGNOSIS — J189 Pneumonia, unspecified organism: Secondary | ICD-10-CM | POA: Diagnosis not present

## 2014-03-23 DIAGNOSIS — G4736 Sleep related hypoventilation in conditions classified elsewhere: Secondary | ICD-10-CM | POA: Diagnosis not present

## 2014-03-23 DIAGNOSIS — J45901 Unspecified asthma with (acute) exacerbation: Secondary | ICD-10-CM | POA: Diagnosis not present

## 2014-03-23 DIAGNOSIS — F17200 Nicotine dependence, unspecified, uncomplicated: Secondary | ICD-10-CM | POA: Diagnosis not present

## 2014-03-23 DIAGNOSIS — K210 Gastro-esophageal reflux disease with esophagitis: Secondary | ICD-10-CM | POA: Diagnosis not present

## 2014-03-23 DIAGNOSIS — E1165 Type 2 diabetes mellitus with hyperglycemia: Secondary | ICD-10-CM | POA: Diagnosis not present

## 2014-03-26 DIAGNOSIS — H6012 Cellulitis of left external ear: Secondary | ICD-10-CM | POA: Diagnosis not present

## 2014-03-26 DIAGNOSIS — F17200 Nicotine dependence, unspecified, uncomplicated: Secondary | ICD-10-CM | POA: Diagnosis not present

## 2014-03-26 DIAGNOSIS — F200 Paranoid schizophrenia: Secondary | ICD-10-CM | POA: Diagnosis not present

## 2014-03-26 DIAGNOSIS — J189 Pneumonia, unspecified organism: Secondary | ICD-10-CM | POA: Diagnosis not present

## 2014-03-26 DIAGNOSIS — J45901 Unspecified asthma with (acute) exacerbation: Secondary | ICD-10-CM | POA: Diagnosis not present

## 2014-03-26 DIAGNOSIS — E1165 Type 2 diabetes mellitus with hyperglycemia: Secondary | ICD-10-CM | POA: Diagnosis not present

## 2014-03-30 DIAGNOSIS — E1165 Type 2 diabetes mellitus with hyperglycemia: Secondary | ICD-10-CM | POA: Diagnosis not present

## 2014-03-30 DIAGNOSIS — F200 Paranoid schizophrenia: Secondary | ICD-10-CM | POA: Diagnosis not present

## 2014-03-30 DIAGNOSIS — J45901 Unspecified asthma with (acute) exacerbation: Secondary | ICD-10-CM | POA: Diagnosis not present

## 2014-03-30 DIAGNOSIS — F17200 Nicotine dependence, unspecified, uncomplicated: Secondary | ICD-10-CM | POA: Diagnosis not present

## 2014-03-31 DIAGNOSIS — R7611 Nonspecific reaction to tuberculin skin test without active tuberculosis: Secondary | ICD-10-CM | POA: Diagnosis not present

## 2014-03-31 DIAGNOSIS — D4989 Neoplasm of unspecified behavior of other specified sites: Secondary | ICD-10-CM | POA: Diagnosis not present

## 2014-03-31 DIAGNOSIS — E6609 Other obesity due to excess calories: Secondary | ICD-10-CM | POA: Diagnosis not present

## 2014-03-31 DIAGNOSIS — G4733 Obstructive sleep apnea (adult) (pediatric): Secondary | ICD-10-CM | POA: Diagnosis not present

## 2014-04-01 DIAGNOSIS — E1165 Type 2 diabetes mellitus with hyperglycemia: Secondary | ICD-10-CM | POA: Diagnosis not present

## 2014-04-01 DIAGNOSIS — F17200 Nicotine dependence, unspecified, uncomplicated: Secondary | ICD-10-CM | POA: Diagnosis not present

## 2014-04-01 DIAGNOSIS — J45901 Unspecified asthma with (acute) exacerbation: Secondary | ICD-10-CM | POA: Diagnosis not present

## 2014-04-01 DIAGNOSIS — F200 Paranoid schizophrenia: Secondary | ICD-10-CM | POA: Diagnosis not present

## 2014-04-07 DIAGNOSIS — E1165 Type 2 diabetes mellitus with hyperglycemia: Secondary | ICD-10-CM | POA: Diagnosis not present

## 2014-04-07 DIAGNOSIS — F17200 Nicotine dependence, unspecified, uncomplicated: Secondary | ICD-10-CM | POA: Diagnosis not present

## 2014-04-07 DIAGNOSIS — F200 Paranoid schizophrenia: Secondary | ICD-10-CM | POA: Diagnosis not present

## 2014-04-07 DIAGNOSIS — J45901 Unspecified asthma with (acute) exacerbation: Secondary | ICD-10-CM | POA: Diagnosis not present

## 2014-04-08 DIAGNOSIS — E1165 Type 2 diabetes mellitus with hyperglycemia: Secondary | ICD-10-CM | POA: Diagnosis not present

## 2014-04-08 DIAGNOSIS — G4733 Obstructive sleep apnea (adult) (pediatric): Secondary | ICD-10-CM | POA: Diagnosis not present

## 2014-04-08 DIAGNOSIS — R000 Tachycardia, unspecified: Secondary | ICD-10-CM | POA: Diagnosis not present

## 2014-04-08 DIAGNOSIS — E038 Other specified hypothyroidism: Secondary | ICD-10-CM | POA: Diagnosis not present

## 2014-04-09 ENCOUNTER — Emergency Department
Admit: 2014-04-09 | Disposition: A | Discharge status: Home / Self Care | Payer: Self-pay | Admitting: Emergency Medicine

## 2014-04-09 DIAGNOSIS — F209 Schizophrenia, unspecified: Secondary | ICD-10-CM | POA: Diagnosis not present

## 2014-04-09 DIAGNOSIS — E119 Type 2 diabetes mellitus without complications: Secondary | ICD-10-CM | POA: Diagnosis not present

## 2014-04-09 LAB — URINALYSIS, COMPLETE
Bacteria: NONE SEEN
Bilirubin,UR: NEGATIVE
Blood: NEGATIVE
GLUCOSE, UR: NEGATIVE mg/dL (ref 0–75)
Ketone: NEGATIVE
LEUKOCYTE ESTERASE: NEGATIVE
NITRITE: NEGATIVE
Ph: 6 (ref 4.5–8.0)
Protein: NEGATIVE
SPECIFIC GRAVITY: 1.015 (ref 1.003–1.030)

## 2014-04-09 LAB — COMPREHENSIVE METABOLIC PANEL
ANION GAP: 9 (ref 7–16)
Albumin: 3.8 g/dL
Alkaline Phosphatase: 37 U/L — ABNORMAL LOW
BUN: 8 mg/dL
Bilirubin,Total: <0.1 mg/dL — ABNORMAL LOW
CO2: 28 mmol/L
Calcium, Total: 8.4 mg/dL — ABNORMAL LOW
Chloride: 101 mmol/L
Creatinine: 0.93 mg/dL
GLUCOSE: 175 mg/dL — AB
POTASSIUM: 4.3 mmol/L
SGOT(AST): 29 U/L
SGPT (ALT): 21 U/L
SODIUM: 138 mmol/L
Total Protein: 7.4 g/dL

## 2014-04-09 LAB — CBC
HCT: 39 % — ABNORMAL LOW (ref 40.0–52.0)
HGB: 12.2 g/dL — ABNORMAL LOW (ref 13.0–18.0)
MCH: 26.6 pg (ref 26.0–34.0)
MCHC: 31.2 g/dL — ABNORMAL LOW (ref 32.0–36.0)
MCV: 85 fL (ref 80–100)
PLATELETS: 202 10*3/uL (ref 150–440)
RBC: 4.58 10*6/uL (ref 4.40–5.90)
RDW: 15.5 % — ABNORMAL HIGH (ref 11.5–14.5)
WBC: 8.5 10*3/uL (ref 3.8–10.6)

## 2014-04-09 LAB — DRUG SCREEN, URINE
AMPHETAMINES, UR SCREEN: NEGATIVE
Barbiturates, Ur Screen: NEGATIVE
Benzodiazepine, Ur Scrn: NEGATIVE
COCAINE METABOLITE, UR ~~LOC~~: NEGATIVE
Cannabinoid 50 Ng, Ur ~~LOC~~: NEGATIVE
MDMA (Ecstasy)Ur Screen: NEGATIVE
Methadone, Ur Screen: NEGATIVE
Opiate, Ur Screen: NEGATIVE
Phencyclidine (PCP) Ur S: NEGATIVE
TRICYCLIC, UR SCREEN: NEGATIVE

## 2014-04-09 LAB — ETHANOL: Ethanol: <5 mg/dL

## 2014-04-09 LAB — ACETAMINOPHEN LEVEL

## 2014-04-09 LAB — SALICYLATE LEVEL

## 2014-04-14 DIAGNOSIS — E1165 Type 2 diabetes mellitus with hyperglycemia: Secondary | ICD-10-CM | POA: Diagnosis not present

## 2014-04-14 DIAGNOSIS — F17200 Nicotine dependence, unspecified, uncomplicated: Secondary | ICD-10-CM | POA: Diagnosis not present

## 2014-04-14 DIAGNOSIS — J45901 Unspecified asthma with (acute) exacerbation: Secondary | ICD-10-CM | POA: Diagnosis not present

## 2014-04-14 DIAGNOSIS — F200 Paranoid schizophrenia: Secondary | ICD-10-CM | POA: Diagnosis not present

## 2014-04-16 ENCOUNTER — Other Ambulatory Visit: Payer: Self-pay | Admitting: Specialist

## 2014-04-16 DIAGNOSIS — D4989 Neoplasm of unspecified behavior of other specified sites: Secondary | ICD-10-CM

## 2014-04-22 DIAGNOSIS — R000 Tachycardia, unspecified: Secondary | ICD-10-CM | POA: Diagnosis not present

## 2014-04-23 DIAGNOSIS — F250 Schizoaffective disorder, bipolar type: Secondary | ICD-10-CM | POA: Diagnosis not present
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
## 2014-04-24 DIAGNOSIS — H4011X1 Primary open-angle glaucoma, mild stage: Secondary | ICD-10-CM | POA: Diagnosis not present

## 2014-04-24 NOTE — H&P (Signed)
PATIENT NAME:  John [name], John [name] MR#:  [id number] DATE OF BIRTH:  [birth year]  DATE OF ADMISSION:  11/23/2013  PRIMARY CARE PROVIDER:  Cletis Athens, MD  EMERGENCY DEPARTMENT REFERRING PHYSICIAN:  Dr. Archie Balboa.   CHIEF COMPLAINT: Shortness of breath, cough.   HISTORY OF PRESENT ILLNESS: The patient is a 39 year old, African American male with history of asthma, nicotine abuse who has been having shortness of breath for the past 3 to 4 days, productive cough.  His breathing got worse.  He came to the ED. He was noted to be in respiratory distress and had to be placed on a BiPAP.  He is noted to have a right lower lobe airspace consolidation. The patient is a little sleepy but he is easily arousable.   REVIEW OF SYSTEMS: Limited. He has not had any fevers or chills, does complain of chest pain with coughing.   PAST MEDICAL HISTORY:  1.  Apparently history of non-insulin-dependent diabetes, but he is not on any medication for diabetes.  2.  History of asthma, but not on any inhalers, according to his medication list.  3.  History of hypothyroidism.  4.  History of personality disorder.  5.  History of paranoid schizophrenia.  6.  Hyperlipidemia.  7.  Morbid obesity.   ALLERGIES: None.   MEDICATIONS AT HOME: He is on Zocor 20 daily, Tussin DM 5 mL every 6 p.r.n., Therems 1 tab p.o. daily, Tenormin 100 daily, Synthroid 75 mcg daily, Risperdal 1 mg b.i.d., Provigil 200 daily, promethazine 25 every 6 p.r.n., oxybutynin 5 mg daily, omeprazole 20 daily, Mobic 7.5 p.o. daily, hydrocortisone topically applied to affected area daily, Depakote ER 500 mg 3 tabs daily, clonazepam 100 mg 2-1/2 tablets daily, Clobetasol topically 0.05% applied topically to affected area b.i.d., Claritin 10 daily.   SOCIAL HISTORY: Smokes 1 pack per day.  Drinks socially. No drug use.   FAMILY HISTORY: Positive for hypertension.   REVIEW OF SYSTEMS:  Limited due to the patient being a little sleepy.  CONSTITUTIONAL: Denies any  fevers. Complains of fatigue, weakness. No weight loss. No weight gain.  EYES: No blurred or double vision. No redness. No inflammation. No glaucoma.  ENT: No tinnitus. No ear pain. No hearing loss. No seasonal or year-round. No epistaxis. No nasal discharge. No difficulty swallowing.  RESPIRATORY: Complains of cough, wheezing. Has chronic obstructive pulmonary disease.  CARDIOVASCULAR: Denies any chest pain, orthopnea, edema, or arrhythmia.  GASTROINTESTINAL: No nausea, vomiting, diarrhea. No abdominal pain. No hematemesis.  GENITOURINARY: Denies any dysuria, hematuria, renal calculus or frequency.  ENDOCRINE: Denies any polyuria or nocturia, thyroid problems.  HEMATOLOGIC AND LYMPHATIC: No anemia, easy bruisability or bleeding.  SKIN: No acne. No rash.  MUSCULOSKELETAL: Denies any pain in the neck, back or shoulder.  NEUROLOGIC:  No CVA, transient ischemic attack or seizures.  PSYCHIATRIC: Has personality disorder, paranoid schizophrenia.   PHYSICAL EXAMINATION:  VITAL SIGNS: Temperature 97, pulse 131, respirations 30, blood pressure 125/78, O2 of 69% on room air.  GENERAL: The patient is a morbidly obese, African American male, critically ill-appearing on a BiPAP.  HEENT: Head atraumatic, normocephalic. Pupils equally round, reactive to light and accommodation. There is no conjunctival pallor. No sclerae icterus. Nasal exam shows no drainage or ulceration.  ENT: If there is no nasal drainage or ulceration. External ear exam shows no erythema or drainage.  OROPHARYNX: Clear without any exudate.  NECK:  Supple without any thyromegaly.   CARDIOVASCULAR: Tachycardic. No murmurs, rubs, clicks, or gallops.  LUNGS: Bilateral  wheezing throughout both lungs with accessory muscle usage, no rhonchi.  ABDOMEN: Soft, nontender, nondistended. Positive bowel sounds x 4.  EXTREMITIES: No clubbing, cyanosis, or edema.  SKIN: No rash.  LYMPH NODES: Nonpalpable.  VASCULAR: Good DP, PT pulses.   PSYCHIATRIC: Not anxious or depressed.  NEUROLOGIC: Awake, alert, and oriented x 3. No focal deficits.   LABORATORY DATA:  Glucose 148, BUN 16, creatinine 1.26, sodium 137, potassium is 3.8, chloride 99, CO2 is 29, calcium 7.7. LFTs, total protein 8.8, albumin 3.3, bilirubin total 0.4, AST 44, ALT 44, CPK 425, CK-MB 1.3. Troponin 0.32. WBC 9.2, hemoglobin 13.4, platelet count 185,000.  ABG: PH of 7.29.  PCO2 of 65, O2 of 69. Chest x-ray shows right lower lobe airspace consolidation, left lung clear, mild cardiac enlargement.   ASSESSMENT AND PLAN: The patient is a 39 year old group home resident presents with shortness of breath, cough on BiPAP.  1.  Acute hypoxic, hypercarbic respiratory failure due to acute asthma exacerbation.  At this time, we will place him on nebulizers, IV Solu-Medrol and antibiotics for his pneumonia. We will repeat an ABG to monitor his respiratory status.  I have discussed the case with Dr. Mortimer Fries who also evaluated the patient.  2.  Community-acquired pneumonia, treat with IV Levaquin. Follow blood cultures.  3.  Diabetes, not on any medications.  We will do sliding scale, check a hemoglobin A1c.   4.  Hyperlipidemia. Continue simvastatin.  Paranoid schizophrenia and personality disorder. We will continue patient's home regimen.  5.  Miscellaneous. He will be on Lovenox for deep vein thrombosis prophylaxis.  TIME SPENT ON CRITICAL TIME:  60 minutes.     ____________________________ Lafonda Mosses Posey Pronto, MD shp:DT D: 11/23/2013 14:58:54 ET T: 11/23/2013 15:35:38 ET JOB#: [id number]  cc: Xareni Kelch H. Posey Pronto, MD, <Dictator> Cletis Athens, MD Alric Seton MD ELECTRONICALLY SIGNED 11/29/2013 13:36

## 2014-04-24 NOTE — Consult Note (Signed)
PATIENT NAME:  John [name], John [name] MR#:  [id number] DATE OF BIRTH:  1975/05/09  DATE OF CONSULTATION:  11/24/2013  REFERRING PHYSICIAN:   CONSULTING PHYSICIAN:  Dionisio David, MD  CARDIOLOGY CONSULTATION  INDICATION FOR CONSULTATION: Elevated troponin.   HISTORY OF PRESENT ILLNESS: This is a 39 year old African American male with a past medical history of asthma and nicotine abuse, who presented to the Emergency Room with cough and shortness of breath. He was severe shortness of breath requiring BiPAP. In the ED, he continued to be hypoxic. His chest x-ray showed right lower lobe consolidation, suggestive of pneumonia. I was asked to evaluate the patient because of elevated troponin. The patient denies any chest pain or tightness in the chest, palpitations, or dizziness.   PAST MEDICAL HISTORY: He has a history of non-insulin-dependent diabetes, asthma, hypothyroidism, schizophrenia, hyperlipidemia, morbid obesity.   ALLERGIES: None.   MEDICATIONS: Zocor, Mobic, Tenormin, Risperdal.  SOCIAL HISTORY: He smokes 1 pack per day. No history of EtOH abuse or drug use.   FAMILY HISTORY: Positive for hypertension.   PHYSICAL EXAMINATION:  GENERAL: He is alert and oriented x 3, in no acute distress right now.  VITAL SIGNS: His blood pressure 100/67, respirations are 14, pulse is 92, temperature 98, saturation is 100%.  NECK: No JVD.  LUNGS: There are crackles at the right lower lung field.   HEART: Regular rate and rhythm. Normal S1, S2. No audible murmur.  ABDOMEN: Soft, nontender, positive bowel sounds.  EXTREMITIES: No pedal edema.  NEUROLOGIC: The patient appears to be intact.   DIAGNOSTIC DATA: EKG shows sinus tachycardia, nonspecific ST-T changes. His troponins initially were 0.25, 0.31, and 0.32. His BUN and creatinine were normal. Glucose was 148.   ASSESSMENT AND PLAN: The patient has a right lower lobe pneumonia with severe shortness of breath. No chest pain. Mildly elevated troponin  and EKG showing sinus tachycardia with nonspecific ST-T changes. I believe these mildly elevated troponins are not related to non-STEMI. His echocardiogram showed normal left ventricular systolic function and normal wall motion, just trace mitral regurgitation and trace tricuspid regurgitation. Advised continuation of treatment of pneumonia, and we will follow the patient with you. He may have coronary artery disease, so he may need outpatient work-up such as stress test.   Thank you very much for the referral.    ____________________________ Dionisio David, MD sak:MT D: 11/24/2013 09:12:18 ET T: 11/24/2013 09:35:50 ET JOB#: [id number]  cc: Dionisio David, MD, <Dictator> Dionisio David MD ELECTRONICALLY SIGNED 12/03/2013 15:38

## 2014-04-24 NOTE — Discharge Summary (Signed)
PATIENT NAME:  John [name], John [name] MR#:  [id number] DATE OF BIRTH:  01-Apr-1975  DATE OF ADMISSION:  11/23/2013 DATE OF DISCHARGE:  11/27/2013  DISCHARGE DIAGNOSES: 1.  Asthma exacerbation.  2.  Community-acquired pneumonia.  3.  Uncontrolled diabetes.  4.  Elevated troponin due to supply demand ischemia.  5.  Suspected sleep apnea.   SECONDARY DIAGNOSES:  1.  Hypothyroidism.  2.  Personality disorder.   3.  Paranoid schizophrenia.  4.  Hyperlipidemia.  5.  Morbid obesity.   CONSULTATIONS:  1.  Cardiology, Dionisio David, MD Pulmonary, Mariane Duval, MD   PROCEDURES AND RADIOLOGY:  1.  Chest x-ray on November 23 showed right lower lobe consolidation. Mild cardiac enlargement.  2.  Chest x-ray on November 24 showed persistent right basilar consolidation worrisome for pneumonia.  3.  A 2-D echocardiogram on November 23 showed LVEF of 55% to 60%. Impaired relaxation pattern of LV diastolic filling.   MAJOR LABORATORY PANEL:  1.  Sputum culture grew light growth of Staphylococcus aureus.  2.  Blood cultures x 2 were negative.  3.  Influenza A and B were negative.   HISTORY AND SHORT HOSPITAL COURSE: The patient is a 39 year old male with above-mentioned medical problems who was admitted for acute asthma exacerbation and was found to have pneumonia. Please see Dr. Chana Bode Patel's dictated history and physical for further details. The patient was found to have uncontrolled diabetes along with elevated troponin thought to be secondary to supply demand ischemia for which cardiology consultation was obtained, who recommended a 2-D echo, which was obtained with results dictated above. The patient was also evaluated by pulmonary, Dr. Flora Lipps, who was managing the patient's asthma, along with pneumonia, and was slowly improving. By November 27, the patient was much better, was close to his baseline, and was discharged back to his group home in stable condition. On the date of discharge, his vital  signs were as follows: Temperature 98.6, heart rate 87 per minute, respirations 17 per minute, blood pressure 123/87. He was saturating 90% on room air.   PERTINENT PHYSICAL EXAMINATION ON THE DATE OF DISCHARGE:  CARDIOVASCULAR: S1, S2 normal. No murmurs, rubs, or gallop.  LUNGS: Clear to auscultation bilaterally. No wheezing, rales, rhonchi, or crepitation.  ABDOMEN: Soft, benign.  NEUROLOGIC: Nonfocal examination.   All other physical examination remained at baseline.   DISCHARGE MEDICATIONS:   Medication Instructions  therems m therapeutic multiple vitamins with minerals oral tablet  1 tab(s) orally once a day   zocor 20 mg oral tablet  1 tab(s) orally once a day (at bedtime)   claritin 10 mg oral tablet  1 tab(s) orally once a day   tenormin 100 mg oral tablet  1 tab(s) orally once a day   oxybutynin 5 mg oral tablet  1 tab(s) orally once a day   risperidone 1 mg oral tablet  1 tab(s) orally 2 times a day   tussin dm 10 mg-100 mg/5 ml oral liquid  5 milliliter(s) orally every 6 hours, As Needed for cough.   provigil 200 mg oral tablet  1 tab(s) orally once a day (in the morning)   hydrocortisone topical 2.5% topical cream  Apply topically to affected area once a day   clobetasol topical 0.05% topical cream  Apply topically to affected area 2 times a day   omeprazole 20 mg oral delayed release capsule  1 cap(s) orally once a day   synthroid 75 mcg (0.075 mg) oral tablet  1  tab(s) orally once a day (in the morning)   depakote er 500 mg oral tablet, extended release  3 tab(s) orally once a day (at bedtime)   clozapine 100 mg oral tablet  2.5 tab(s) orally once a day   mobic 7.5 mg oral tablet  1 tab(s) orally once a day   promethazine 25 mg oral tablet  1 tab(s) orally every 6 hours, As Needed - for Nausea, Vomiting   glipizide 5 mg oral tablet  1 tab(s) orally 2 times a day (before meals)   metformin 1000 mg oral tablet  1 tab(s) orally 2 times a day (with meals)   levofloxacin 500  mg oral tablet  1 tab(s) orally once a day x 2 days     DISCHARGE DIET: Low fat, low cholesterol, 1800 ADA.   DISCHARGE ACTIVITY: As tolerated.   DISCHARGE INSTRUCTIONS AND FOLLOWUP: The patient was instructed to follow up with his primary care physician, Dr. Cletis Athens, in 1 to 2 weeks. He was set up to get home health nurse, nursing aide.   TOTAL TIME DISCHARGING THIS PATIENT: 55 minutes.   He remains at very high risk for readmission.    ____________________________ Lucina Mellow. Manuella Ghazi, MD vss:ts D: 11/28/2013 20:44:00 ET T: 11/28/2013 21:23:37 ET JOB#: [id number]  cc: Darrin Apodaca S. Manuella Ghazi, MD, <Dictator> Cletis Athens, MD Mariane Duval, MD Dionisio David, MD [place] MD ELECTRONICALLY SIGNED 11/30/2013 7:38

## 2014-04-30 DIAGNOSIS — J45901 Unspecified asthma with (acute) exacerbation: Secondary | ICD-10-CM | POA: Diagnosis not present

## 2014-04-30 DIAGNOSIS — F17200 Nicotine dependence, unspecified, uncomplicated: Secondary | ICD-10-CM | POA: Diagnosis not present

## 2014-04-30 DIAGNOSIS — E1165 Type 2 diabetes mellitus with hyperglycemia: Secondary | ICD-10-CM | POA: Diagnosis not present

## 2014-04-30 DIAGNOSIS — F200 Paranoid schizophrenia: Secondary | ICD-10-CM | POA: Diagnosis not present

## 2014-05-02 NOTE — Consult Note (Signed)
Brief Consult Note: Diagnosis: schizophrenia.   Patient was seen by consultant.   Consult note dictated.   Discussed with Attending MD.   Comments: PSychiatry: PAtient with schizophrenia lives at group home. No acute SI or HI. Currently calm and does not need hospital treatment. Can dc to group home.  Electronic Signatures: [name] (MD)  (Signed 08-Apr-16 17:18)  Authored: Brief Consult Note   Last Updated: 08-Apr-16 17:18 by [name] (MD)
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
## 2014-05-11 DIAGNOSIS — J45901 Unspecified asthma with (acute) exacerbation: Secondary | ICD-10-CM | POA: Diagnosis not present

## 2014-05-11 DIAGNOSIS — E1165 Type 2 diabetes mellitus with hyperglycemia: Secondary | ICD-10-CM | POA: Diagnosis not present

## 2014-05-11 DIAGNOSIS — F17200 Nicotine dependence, unspecified, uncomplicated: Secondary | ICD-10-CM | POA: Diagnosis not present

## 2014-05-11 DIAGNOSIS — F200 Paranoid schizophrenia: Secondary | ICD-10-CM | POA: Diagnosis not present

## 2014-05-17 ENCOUNTER — Ambulatory Visit
Admission: RE | Admit: 2014-05-17 | Discharge: 2014-05-17 | Disposition: A | Discharge status: Home / Self Care | Payer: Medicare Other | Source: Ambulatory Visit | Attending: Specialist | Admitting: Specialist

## 2014-05-17 DIAGNOSIS — D4989 Neoplasm of unspecified behavior of other specified sites: Secondary | ICD-10-CM | POA: Diagnosis not present

## 2014-05-17 DIAGNOSIS — K760 Fatty (change of) liver, not elsewhere classified: Secondary | ICD-10-CM | POA: Insufficient documentation

## 2014-05-17 DIAGNOSIS — J90 Pleural effusion, not elsewhere classified: Secondary | ICD-10-CM | POA: Insufficient documentation

## 2014-05-24 DIAGNOSIS — F200 Paranoid schizophrenia: Secondary | ICD-10-CM | POA: Diagnosis not present

## 2014-05-24 DIAGNOSIS — J45901 Unspecified asthma with (acute) exacerbation: Secondary | ICD-10-CM | POA: Diagnosis not present

## 2014-05-24 DIAGNOSIS — F17200 Nicotine dependence, unspecified, uncomplicated: Secondary | ICD-10-CM | POA: Diagnosis not present

## 2014-05-24 DIAGNOSIS — E1165 Type 2 diabetes mellitus with hyperglycemia: Secondary | ICD-10-CM | POA: Diagnosis not present

## 2014-06-03 DIAGNOSIS — L309 Dermatitis, unspecified: Secondary | ICD-10-CM | POA: Diagnosis not present

## 2014-06-03 DIAGNOSIS — K210 Gastro-esophageal reflux disease with esophagitis: Secondary | ICD-10-CM | POA: Diagnosis not present

## 2014-06-03 DIAGNOSIS — G4736 Sleep related hypoventilation in conditions classified elsewhere: Secondary | ICD-10-CM | POA: Diagnosis not present

## 2014-06-03 DIAGNOSIS — G4733 Obstructive sleep apnea (adult) (pediatric): Secondary | ICD-10-CM | POA: Diagnosis not present

## 2014-06-29 DIAGNOSIS — F250 Schizoaffective disorder, bipolar type: Secondary | ICD-10-CM | POA: Diagnosis not present

## 2014-06-29 DIAGNOSIS — E119 Type 2 diabetes mellitus without complications: Secondary | ICD-10-CM | POA: Diagnosis not present

## 2014-06-29 DIAGNOSIS — E1165 Type 2 diabetes mellitus with hyperglycemia: Secondary | ICD-10-CM | POA: Diagnosis not present

## 2014-08-03 DIAGNOSIS — E1165 Type 2 diabetes mellitus with hyperglycemia: Secondary | ICD-10-CM | POA: Diagnosis not present

## 2014-08-03 DIAGNOSIS — R21 Rash and other nonspecific skin eruption: Secondary | ICD-10-CM | POA: Diagnosis not present

## 2014-08-03 DIAGNOSIS — K210 Gastro-esophageal reflux disease with esophagitis: Secondary | ICD-10-CM | POA: Diagnosis not present

## 2014-08-03 DIAGNOSIS — G4736 Sleep related hypoventilation in conditions classified elsewhere: Secondary | ICD-10-CM | POA: Diagnosis not present

## 2014-08-16 DIAGNOSIS — F250 Schizoaffective disorder, bipolar type: Secondary | ICD-10-CM | POA: Diagnosis not present

## 2014-08-18 DIAGNOSIS — F209 Schizophrenia, unspecified: Secondary | ICD-10-CM | POA: Diagnosis not present

## 2014-09-06 ENCOUNTER — Encounter: Payer: Self-pay | Admitting: *Deleted

## 2014-09-06 ENCOUNTER — Emergency Department
Admission: EM | Admit: 2014-09-06 | Discharge: 2014-09-06 | Disposition: A | Discharge status: Home / Self Care | Payer: Medicare Other | Attending: Emergency Medicine | Admitting: Emergency Medicine

## 2014-09-06 DIAGNOSIS — K625 Hemorrhage of anus and rectum: Secondary | ICD-10-CM | POA: Diagnosis present

## 2014-09-06 DIAGNOSIS — K648 Other hemorrhoids: Secondary | ICD-10-CM | POA: Insufficient documentation

## 2014-09-06 DIAGNOSIS — E119 Type 2 diabetes mellitus without complications: Secondary | ICD-10-CM | POA: Diagnosis not present

## 2014-09-06 DIAGNOSIS — Z79899 Other long term (current) drug therapy: Secondary | ICD-10-CM | POA: Insufficient documentation

## 2014-09-06 DIAGNOSIS — I10 Essential (primary) hypertension: Secondary | ICD-10-CM | POA: Insufficient documentation

## 2014-09-06 HISTORY — DX: Paranoid schizophrenia: F20.0

## 2014-09-06 HISTORY — DX: Personality disorder, unspecified: F60.9

## 2014-09-06 LAB — COMPREHENSIVE METABOLIC PANEL
ALK PHOS: 39 U/L (ref 38–126)
ALT: 22 U/L (ref 17–63)
ANION GAP: 6 (ref 5–15)
AST: 26 U/L (ref 15–41)
Albumin: 3.8 g/dL (ref 3.5–5.0)
BILIRUBIN TOTAL: 0.3 mg/dL (ref 0.3–1.2)
BUN: 7 mg/dL (ref 6–20)
CALCIUM: 8.5 mg/dL — AB (ref 8.9–10.3)
CO2: 31 mmol/L (ref 22–32)
CREATININE: 0.95 mg/dL (ref 0.61–1.24)
Chloride: 99 mmol/L — ABNORMAL LOW (ref 101–111)
GFR calc non Af Amer: >60 mL/min (ref >60)
Glucose, Bld: 60 mg/dL — ABNORMAL LOW (ref 65–99)
Potassium: 3.8 mmol/L (ref 3.5–5.1)
Sodium: 136 mmol/L (ref 135–145)
TOTAL PROTEIN: 7.6 g/dL (ref 6.5–8.1)

## 2014-09-06 LAB — CBC
HCT: 38.8 % — ABNORMAL LOW (ref 40.0–52.0)
HEMOGLOBIN: 12.5 g/dL — AB (ref 13.0–18.0)
MCH: 26.9 pg (ref 26.0–34.0)
MCHC: 32.1 g/dL (ref 32.0–36.0)
MCV: 83.9 fL (ref 80.0–100.0)
PLATELETS: 192 10*3/uL (ref 150–440)
RBC: 4.62 MIL/uL (ref 4.40–5.90)
RDW: 15.4 % — ABNORMAL HIGH (ref 11.5–14.5)
WBC: 5 10*3/uL (ref 3.8–10.6)

## 2014-09-06 NOTE — ED Notes (Signed)
Pt given sand which tray at this time, sitting up in bed eating and tolerating well. No acute distress noted.

## 2014-09-06 NOTE — ED Notes (Signed)
Pt will stay in room until cab arrives and RN will escort pt out. Pt made aware and verbalized understanding at this time

## 2014-09-06 NOTE — ED Notes (Signed)
Pt to ED from [facility] via EMS due to rectal pain and rectal blood noted today. Pt is paranoid schizophrenic, states "My bottom just hurts when I walk and when I wiped there was blood on the tissue today"  Pt states pain is 6/10. Pt's thought process scattered at this time, per EMS this is baseline. Vitals wnl at this time, no acute distress noted.

## 2014-09-06 NOTE — ED Notes (Signed)
Pt's mother called RN for update, explained will call back once more is known regarding pt status. Mother verbalized understanding, contact info:  Rainn Bullinger # [phone]

## 2014-09-06 NOTE — ED Notes (Signed)
RN called pt's mother for pick up and mother states she lives "out of town in [place]" and is unable to to pick pt up. RN called [facility] in order to see if they cab transport pt and RN spoke to [place] who stated she must speak to boss before making decision. Mariann Laster stated will call RN back momentarily.

## 2014-09-06 NOTE — ED Notes (Signed)
MD Forbach at bedside. 

## 2014-09-06 NOTE — Discharge Instructions (Signed)
As we discussed, we believe that you are suffering from mild internal hemorrhoids which may be a little bit worse since she said you are having a little bit of constipation.  There is no evidence of severe bleeding and no evidence of obvious blood in her stool at this time.  There is also nothing to suggest you have an infection around your anus.  Please try using a stool softener such as Colace 100 mg once daily (available over-the-counter).  You can also look for over-the-counter medication such as Preparation H and that may help with some of the discomfort you are feeling in your anus.  Follow-up with your primary care doctor within 2-3 days for a follow-up appointment.  Return to the emergency department if he develop new or worsening symptoms that concern you.   Hemorrhoids Hemorrhoids are puffy (swollen) veins around the rectum or anus. Hemorrhoids can cause pain, itching, bleeding, or irritation. HOME CARE  Eat foods with fiber, such as whole grains, beans, nuts, fruits, and vegetables. Ask your doctor about taking products with added fiber in them (fibersupplements).  Drink enough fluid to keep your pee (urine) clear or pale yellow.  Exercise often.  Go to the bathroom when you have the urge to poop. Do not wait.  Avoid straining to poop (bowel movement).  Keep the butt area dry and clean. Use wet toilet paper or moist paper towels.  Medicated creams and medicine inserted into the anus (anal suppository) may be used or applied as told.  Only take medicine as told by your doctor.  Take a warm water bath (sitz bath) for 15-20 minutes to ease pain. Do this 3-4 times a day.  Place ice packs on the area if it is tender or puffy. Use the ice packs between the warm water baths.  Put ice in a plastic bag.  Place a towel between your skin and the bag.  Leave the ice on for 15-20 minutes, 03-04 times a day.  Do not use a donut-shaped pillow or sit on the toilet for a long  time. GET HELP RIGHT AWAY IF:   You have more pain that is not controlled by treatment or medicine.  You have bleeding that will not stop.  You have trouble or are unable to poop (bowel movement).  You have pain or puffiness outside the area of the hemorrhoids. MAKE SURE YOU:   Understand these instructions.  Will watch your condition.  Will get help right away if you are not doing well or get worse. Document Released: 09/27/2007 Document Revised: 12/05/2011 Document Reviewed: 10/30/2011 [facility] Patient Information 2015 [place], [place]. This information is not intended to replace advice given to you by your health care provider. Make sure you discuss any questions you have with your health care provider.

## 2014-09-06 NOTE — ED Notes (Addendum)
Cab arrived at this time to transport home. RN escorted pt out to cab and verified correct address with cab driver and instructed cab driver to wait until staff members can help pt into facility. Cab driver verbalized understanding. Pt stable and ambulatory, no acute distress noted.

## 2014-09-06 NOTE — ED Notes (Signed)
RN spoke to ED MD Karma Greaser who stated pt is safe to go home in a cab. [facility] sending cab at this time. Karma Greaser and Agricultural consultant Lea made aware along with STAT desk out front who will notify RN once cab arrives.

## 2014-09-06 NOTE — ED Provider Notes (Signed)
[facility] Emergency Department Provider Note  ____________________________________________  Time seen: Approximately 8:08 PM  I have reviewed the triage vital signs and the nursing notes.   HISTORY  Chief Complaint Rectal Bleeding and Rectal Pain  Chronically impaired due to psychiatric illness  HPI John [name]. is a 39 y.o. male with a history of paranoid schizophrenia who lives at a group home who presents today with history of pain in his buttocks when he walks and blood on the tissue when he wiped today.  He states this started last night.  He does not have any gross blood, just blood when he wipes.  He is in no pain at rest.  He has had no fever or chills.  He denies chest pain, shortness of breath, abdominal pain.  He does complain of some recent hard stools (constipation).He describes onset as being gradual and the pain as being moderate.   Past Medical History  Diagnosis Date  . Thyroid disease   . Diabetes mellitus   . Asthma   . Sleep apnea   . Anemia   . Paranoid schizophrenia   . Personality disorder   . Obesity     Patient Active Problem List   Diagnosis Date Noted  . MORBID OBESITY 09/29/2008  . OBSTRUCTIVE SLEEP APNEA 09/29/2008  . HYPERTENSION 09/29/2008  . HYPERSOMNIA 09/29/2008    Past Surgical History  Procedure Laterality Date  . Fracture surgery      Current Outpatient Rx  Name  Route  Sig  Dispense  Refill  . atenolol (TENORMIN) 100 MG tablet   Oral   Take 100 mg by mouth daily.         . clozapine (CLOZARIL) 200 MG tablet   Oral   Take 200 mg by mouth daily.          . divalproex (DEPAKOTE ER) 500 MG 24 hr tablet   Oral   Take 1,000 mg by mouth at bedtime.         Marland Kitchen levothyroxine (SYNTHROID, LEVOTHROID) 75 MCG tablet   Oral   Take 75 mcg by mouth daily.         Marland Kitchen loratadine (CLARITIN) 10 MG tablet   Oral   Take 10 mg by mouth daily.         Marland Kitchen lurasidone (LATUDA) 40 MG TABS   Oral   Take  40 mg by mouth daily with breakfast. Takes with 80mg  for total of 120mg          . lurasidone (LATUDA) 80 MG TABS   Oral   Take 80 mg by mouth daily with breakfast. Takes with 40 mg for a total of 120mg          . metFORMIN (GLUCOPHAGE) 1000 MG tablet   Oral   Take 1,000 mg by mouth 2 (two) times daily with a meal.         . Multiple Vitamin (MULTIVITAMIN WITH MINERALS) TABS   Oral   Take 1 tablet by mouth daily.         Marland Kitchen omeprazole (PRILOSEC) 20 MG capsule   Oral   Take 20 mg by mouth daily.         Marland Kitchen oxybutynin (DITROPAN) 5 MG tablet   Oral   Take 5 mg by mouth daily.         . risperiDONE (RISPERDAL) 1 MG tablet   Oral   Take 1 mg by mouth daily as needed. For agitation         .  simvastatin (ZOCOR) 20 MG tablet   Oral   Take 20 mg by mouth daily.           Allergies Review of patient's allergies indicates no known allergies.  History reviewed. No pertinent family history.  Social History Social History  Substance Use Topics  . Smoking status: Never Smoker   . Smokeless tobacco: None  . Alcohol Use: No    Review of Systems Constitutional: No fever/chills Eyes: No visual changes. ENT: No sore throat. Cardiovascular: Denies chest pain. Respiratory: Denies shortness of breath. Gastrointestinal: No abdominal pain.  No nausea, no vomiting.  No diarrhea.  Mild constipation.  Pain in the anus/rectum when he walks with some blood on the toilet paper when he wipes. Genitourinary: Negative for dysuria. Musculoskeletal: Negative for back pain. Skin: Negative for rash. Neurological: Negative for headaches, focal weakness or numbness.  10-point ROS otherwise negative.  ____________________________________________   PHYSICAL EXAM:  VITAL SIGNS: ED Triage Vitals  Enc Vitals Group     BP 09/06/14 1733 147/89 mmHg     Pulse Rate 09/06/14 1733 88     Resp 09/06/14 1733 22     Temp 09/06/14 1733 97.9 F (36.6 C)     Temp src --      SpO2  09/06/14 1733 99 %     Weight 09/06/14 1733 346 lb (156.945 kg)     Height 09/06/14 1733 6\' 2"  (1.88 m)     Head Cir --      Peak Flow --      Pain Score 09/06/14 1734 6     Pain Loc --      Pain Edu? --      Excl. in [place]? --     Constitutional: Alert and oriented. Well appearing and in no acute distress.  Friendly, appropriate. Eyes: Conjunctivae are normal. PERRL. EOMI. Cardiovascular: Normal rate, regular rhythm. Grossly normal heart sounds.  Good peripheral circulation. Respiratory: Normal respiratory effort.  No retractions. Lungs CTAB. Gastrointestinal: Obese.  Soft and nontender. No distention. No abdominal bruits. No CVA tenderness. Rectal:  No external evidence of fissure.  No tenderness to palpation around the anus.  No induration or erythema or swelling to suggest an abscess.  The digital exam was nontender.  The stool was light brown with no evidence of melena/gross blood.  Hemoccult was weakly positive with quality control passed. Musculoskeletal: No lower extremity tenderness nor edema.  No joint effusions. Neurologic:  Normal speech and language. No gross focal neurologic deficits are appreciated.  Skin:  Skin is warm, dry and intact. No rash noted.   ____________________________________________   LABS (all labs ordered are listed, but only abnormal results are displayed)  Labs Reviewed  COMPREHENSIVE METABOLIC PANEL - Abnormal; Notable for the following:    Chloride 99 (*)    Glucose, Bld 60 (*)    Calcium 8.5 (*)    All other components within normal limits  CBC - Abnormal; Notable for the following:    Hemoglobin 12.5 (*)    HCT 38.8 (*)    RDW 15.4 (*)    All other components within normal limits   ____________________________________________  EKG  Not indicated ____________________________________________  RADIOLOGY  Not indicated  ____________________________________________   PROCEDURES  Procedure(s) performed: None  Critical Care  performed: No ____________________________________________   INITIAL IMPRESSION / ASSESSMENT AND PLAN / ED COURSE  Pertinent labs & imaging results that were available during my care of the patient were reviewed by me and considered in  my medical decision making (see chart for details).  The patient is well-appearing with normal vital signs.  He has no evidence of perianal or perirectal abscess.  I suspect internal hemorrhoids causing the very small amount of blood that he is seeing.  He is also complaining of recent constipation which makes it even more likely.  I am recommending a stool softener, over-the-counter hemorrhoid ointment, and outpatient follow-up.  There is no indication for further laboratory or diagnostic evaluation at this time.  ____________________________________________  FINAL CLINICAL IMPRESSION(S) / ED DIAGNOSES  Final diagnoses:  Internal hemorrhoids      NEW MEDICATIONS STARTED DURING THIS VISIT:  New Prescriptions   No medications on file     Hinda Kehr, MD 09/06/14 2038

## 2014-10-05 DIAGNOSIS — F250 Schizoaffective disorder, bipolar type: Secondary | ICD-10-CM | POA: Diagnosis not present

## 2014-10-05 DIAGNOSIS — L503 Dermatographic urticaria: Secondary | ICD-10-CM | POA: Diagnosis not present

## 2014-10-05 DIAGNOSIS — E038 Other specified hypothyroidism: Secondary | ICD-10-CM | POA: Diagnosis not present

## 2014-10-05 DIAGNOSIS — K210 Gastro-esophageal reflux disease with esophagitis: Secondary | ICD-10-CM | POA: Diagnosis not present

## 2014-10-05 DIAGNOSIS — E1165 Type 2 diabetes mellitus with hyperglycemia: Secondary | ICD-10-CM | POA: Diagnosis not present

## 2014-10-06 DIAGNOSIS — Z23 Encounter for immunization: Secondary | ICD-10-CM | POA: Diagnosis not present

## 2014-11-08 DIAGNOSIS — L853 Xerosis cutis: Secondary | ICD-10-CM | POA: Diagnosis not present

## 2014-11-08 DIAGNOSIS — L309 Dermatitis, unspecified: Secondary | ICD-10-CM | POA: Diagnosis not present

## 2014-12-06 DIAGNOSIS — G4736 Sleep related hypoventilation in conditions classified elsewhere: Secondary | ICD-10-CM | POA: Diagnosis not present

## 2014-12-06 DIAGNOSIS — E1165 Type 2 diabetes mellitus with hyperglycemia: Secondary | ICD-10-CM | POA: Diagnosis not present

## 2014-12-07 DIAGNOSIS — R5381 Other malaise: Secondary | ICD-10-CM | POA: Diagnosis not present

## 2014-12-07 DIAGNOSIS — I10 Essential (primary) hypertension: Secondary | ICD-10-CM | POA: Diagnosis not present

## 2014-12-07 DIAGNOSIS — F250 Schizoaffective disorder, bipolar type: Secondary | ICD-10-CM | POA: Diagnosis not present

## 2014-12-07 DIAGNOSIS — E119 Type 2 diabetes mellitus without complications: Secondary | ICD-10-CM | POA: Diagnosis not present

## 2014-12-07 DIAGNOSIS — E784 Other hyperlipidemia: Secondary | ICD-10-CM | POA: Diagnosis not present

## 2014-12-13 DIAGNOSIS — E1165 Type 2 diabetes mellitus with hyperglycemia: Secondary | ICD-10-CM | POA: Diagnosis not present

## 2014-12-13 DIAGNOSIS — K210 Gastro-esophageal reflux disease with esophagitis: Secondary | ICD-10-CM | POA: Diagnosis not present

## 2014-12-13 DIAGNOSIS — G4736 Sleep related hypoventilation in conditions classified elsewhere: Secondary | ICD-10-CM | POA: Diagnosis not present

## 2014-12-13 DIAGNOSIS — G4733 Obstructive sleep apnea (adult) (pediatric): Secondary | ICD-10-CM | POA: Diagnosis not present

## 2014-12-13 DIAGNOSIS — F250 Schizoaffective disorder, bipolar type: Secondary | ICD-10-CM | POA: Diagnosis not present

## 2014-12-21 DIAGNOSIS — M76891 Other specified enthesopathies of right lower limb, excluding foot: Secondary | ICD-10-CM | POA: Diagnosis not present

## 2014-12-21 DIAGNOSIS — E1165 Type 2 diabetes mellitus with hyperglycemia: Secondary | ICD-10-CM | POA: Diagnosis not present

## 2014-12-21 DIAGNOSIS — S8391XA Sprain of unspecified site of right knee, initial encounter: Secondary | ICD-10-CM | POA: Diagnosis not present

## 2014-12-21 DIAGNOSIS — G4733 Obstructive sleep apnea (adult) (pediatric): Secondary | ICD-10-CM | POA: Diagnosis not present

## 2014-12-21 DIAGNOSIS — M25569 Pain in unspecified knee: Secondary | ICD-10-CM | POA: Diagnosis not present

## 2015-01-04 ENCOUNTER — Ambulatory Visit: Payer: Medicare Other | Admitting: Sports Medicine

## 2015-01-18 DIAGNOSIS — L219 Seborrheic dermatitis, unspecified: Secondary | ICD-10-CM | POA: Diagnosis not present

## 2015-01-18 DIAGNOSIS — L2089 Other atopic dermatitis: Secondary | ICD-10-CM | POA: Diagnosis not present

## 2015-01-25 DIAGNOSIS — L503 Dermatographic urticaria: Secondary | ICD-10-CM | POA: Diagnosis not present

## 2015-01-25 DIAGNOSIS — E038 Other specified hypothyroidism: Secondary | ICD-10-CM | POA: Diagnosis not present

## 2015-01-25 DIAGNOSIS — E1165 Type 2 diabetes mellitus with hyperglycemia: Secondary | ICD-10-CM | POA: Diagnosis not present

## 2015-01-25 DIAGNOSIS — K210 Gastro-esophageal reflux disease with esophagitis: Secondary | ICD-10-CM | POA: Diagnosis not present

## 2015-01-31 DIAGNOSIS — D693 Immune thrombocytopenic purpura: Secondary | ICD-10-CM | POA: Diagnosis not present

## 2015-03-29 DIAGNOSIS — F209 Schizophrenia, unspecified: Secondary | ICD-10-CM | POA: Diagnosis not present

## 2015-04-05 DIAGNOSIS — L503 Dermatographic urticaria: Secondary | ICD-10-CM | POA: Diagnosis not present

## 2015-04-05 DIAGNOSIS — K210 Gastro-esophageal reflux disease with esophagitis: Secondary | ICD-10-CM | POA: Diagnosis not present

## 2015-04-05 DIAGNOSIS — E038 Other specified hypothyroidism: Secondary | ICD-10-CM | POA: Diagnosis not present

## 2015-04-05 DIAGNOSIS — E1165 Type 2 diabetes mellitus with hyperglycemia: Secondary | ICD-10-CM | POA: Diagnosis not present

## 2015-05-04 DIAGNOSIS — F209 Schizophrenia, unspecified: Secondary | ICD-10-CM | POA: Diagnosis not present

## 2015-05-09 DIAGNOSIS — Z79899 Other long term (current) drug therapy: Secondary | ICD-10-CM | POA: Diagnosis not present

## 2015-05-09 DIAGNOSIS — F250 Schizoaffective disorder, bipolar type: Secondary | ICD-10-CM | POA: Diagnosis not present

## 2015-05-16 DIAGNOSIS — L03119 Cellulitis of unspecified part of limb: Secondary | ICD-10-CM | POA: Diagnosis not present

## 2015-05-16 DIAGNOSIS — G4736 Sleep related hypoventilation in conditions classified elsewhere: Secondary | ICD-10-CM | POA: Diagnosis not present

## 2015-05-16 DIAGNOSIS — E1165 Type 2 diabetes mellitus with hyperglycemia: Secondary | ICD-10-CM | POA: Diagnosis not present

## 2015-05-16 DIAGNOSIS — L503 Dermatographic urticaria: Secondary | ICD-10-CM | POA: Diagnosis not present

## 2015-05-19 DIAGNOSIS — E1165 Type 2 diabetes mellitus with hyperglycemia: Secondary | ICD-10-CM | POA: Diagnosis not present

## 2015-05-19 DIAGNOSIS — L03116 Cellulitis of left lower limb: Secondary | ICD-10-CM | POA: Diagnosis not present

## 2015-05-19 DIAGNOSIS — E038 Other specified hypothyroidism: Secondary | ICD-10-CM | POA: Diagnosis not present

## 2015-05-19 DIAGNOSIS — L03119 Cellulitis of unspecified part of limb: Secondary | ICD-10-CM | POA: Diagnosis not present

## 2015-05-31 DIAGNOSIS — G4733 Obstructive sleep apnea (adult) (pediatric): Secondary | ICD-10-CM | POA: Diagnosis not present

## 2015-05-31 DIAGNOSIS — E1165 Type 2 diabetes mellitus with hyperglycemia: Secondary | ICD-10-CM | POA: Diagnosis not present

## 2015-05-31 DIAGNOSIS — Z9989 Dependence on other enabling machines and devices: Secondary | ICD-10-CM | POA: Diagnosis not present

## 2015-05-31 DIAGNOSIS — G4736 Sleep related hypoventilation in conditions classified elsewhere: Secondary | ICD-10-CM | POA: Diagnosis not present

## 2015-06-01 DIAGNOSIS — F209 Schizophrenia, unspecified: Secondary | ICD-10-CM | POA: Diagnosis not present

## 2015-07-19 DIAGNOSIS — F250 Schizoaffective disorder, bipolar type: Secondary | ICD-10-CM | POA: Diagnosis not present

## 2015-07-19 DIAGNOSIS — Z79899 Other long term (current) drug therapy: Secondary | ICD-10-CM | POA: Diagnosis not present

## 2015-08-05 DIAGNOSIS — G4736 Sleep related hypoventilation in conditions classified elsewhere: Secondary | ICD-10-CM | POA: Diagnosis not present

## 2015-08-05 DIAGNOSIS — E1165 Type 2 diabetes mellitus with hyperglycemia: Secondary | ICD-10-CM | POA: Diagnosis not present

## 2015-08-05 DIAGNOSIS — G4733 Obstructive sleep apnea (adult) (pediatric): Secondary | ICD-10-CM | POA: Diagnosis not present

## 2015-08-05 DIAGNOSIS — K210 Gastro-esophageal reflux disease with esophagitis: Secondary | ICD-10-CM | POA: Diagnosis not present

## 2015-09-19 DIAGNOSIS — E1165 Type 2 diabetes mellitus with hyperglycemia: Secondary | ICD-10-CM | POA: Diagnosis not present

## 2015-09-19 DIAGNOSIS — G4733 Obstructive sleep apnea (adult) (pediatric): Secondary | ICD-10-CM | POA: Diagnosis not present

## 2015-09-19 DIAGNOSIS — Z9989 Dependence on other enabling machines and devices: Secondary | ICD-10-CM | POA: Diagnosis not present

## 2015-09-19 DIAGNOSIS — G4736 Sleep related hypoventilation in conditions classified elsewhere: Secondary | ICD-10-CM | POA: Diagnosis not present

## 2015-10-04 DIAGNOSIS — F250 Schizoaffective disorder, bipolar type: Secondary | ICD-10-CM | POA: Diagnosis not present

## 2015-10-04 DIAGNOSIS — Z79899 Other long term (current) drug therapy: Secondary | ICD-10-CM | POA: Diagnosis not present

## 2015-10-28 DIAGNOSIS — J209 Acute bronchitis, unspecified: Secondary | ICD-10-CM | POA: Diagnosis not present

## 2015-10-28 DIAGNOSIS — R05 Cough: Secondary | ICD-10-CM | POA: Diagnosis not present

## 2015-10-28 DIAGNOSIS — R030 Elevated blood-pressure reading, without diagnosis of hypertension: Secondary | ICD-10-CM | POA: Diagnosis not present

## 2015-11-04 DIAGNOSIS — Z23 Encounter for immunization: Secondary | ICD-10-CM | POA: Diagnosis not present

## 2015-11-04 DIAGNOSIS — E1165 Type 2 diabetes mellitus with hyperglycemia: Secondary | ICD-10-CM | POA: Diagnosis not present

## 2015-11-04 DIAGNOSIS — L309 Dermatitis, unspecified: Secondary | ICD-10-CM | POA: Diagnosis not present

## 2015-11-04 DIAGNOSIS — K210 Gastro-esophageal reflux disease with esophagitis: Secondary | ICD-10-CM | POA: Diagnosis not present

## 2015-11-11 DIAGNOSIS — G4733 Obstructive sleep apnea (adult) (pediatric): Secondary | ICD-10-CM | POA: Diagnosis not present

## 2015-11-14 DIAGNOSIS — G4733 Obstructive sleep apnea (adult) (pediatric): Secondary | ICD-10-CM | POA: Diagnosis not present

## 2015-11-25 ENCOUNTER — Inpatient Hospital Stay (HOSPITAL_COMMUNITY)
Admission: EM | Admit: 2015-11-25 | Discharge: 2015-11-28 | DRG: 189 | Disposition: A | Discharge status: Home Health | Payer: Medicare Other | Attending: Internal Medicine | Admitting: Internal Medicine

## 2015-11-25 ENCOUNTER — Emergency Department (HOSPITAL_COMMUNITY): Payer: Medicare Other

## 2015-11-25 ENCOUNTER — Encounter (HOSPITAL_COMMUNITY): Payer: Self-pay

## 2015-11-25 ENCOUNTER — Inpatient Hospital Stay (HOSPITAL_COMMUNITY): Payer: Medicare Other

## 2015-11-25 DIAGNOSIS — J9600 Acute respiratory failure, unspecified whether with hypoxia or hypercapnia: Secondary | ICD-10-CM | POA: Diagnosis present

## 2015-11-25 DIAGNOSIS — E039 Hypothyroidism, unspecified: Secondary | ICD-10-CM | POA: Diagnosis present

## 2015-11-25 DIAGNOSIS — I509 Heart failure, unspecified: Secondary | ICD-10-CM

## 2015-11-25 DIAGNOSIS — Z79899 Other long term (current) drug therapy: Secondary | ICD-10-CM

## 2015-11-25 DIAGNOSIS — J189 Pneumonia, unspecified organism: Secondary | ICD-10-CM | POA: Diagnosis present

## 2015-11-25 DIAGNOSIS — J9601 Acute respiratory failure with hypoxia: Secondary | ICD-10-CM | POA: Diagnosis not present

## 2015-11-25 DIAGNOSIS — I110 Hypertensive heart disease with heart failure: Secondary | ICD-10-CM | POA: Diagnosis present

## 2015-11-25 DIAGNOSIS — J45901 Unspecified asthma with (acute) exacerbation: Secondary | ICD-10-CM | POA: Diagnosis present

## 2015-11-25 DIAGNOSIS — E119 Type 2 diabetes mellitus without complications: Secondary | ICD-10-CM | POA: Diagnosis present

## 2015-11-25 DIAGNOSIS — J209 Acute bronchitis, unspecified: Secondary | ICD-10-CM | POA: Diagnosis not present

## 2015-11-25 DIAGNOSIS — I10 Essential (primary) hypertension: Secondary | ICD-10-CM | POA: Diagnosis present

## 2015-11-25 DIAGNOSIS — Z7984 Long term (current) use of oral hypoglycemic drugs: Secondary | ICD-10-CM

## 2015-11-25 DIAGNOSIS — Z720 Tobacco use: Secondary | ICD-10-CM

## 2015-11-25 DIAGNOSIS — E1169 Type 2 diabetes mellitus with other specified complication: Secondary | ICD-10-CM

## 2015-11-25 DIAGNOSIS — E079 Disorder of thyroid, unspecified: Secondary | ICD-10-CM | POA: Diagnosis present

## 2015-11-25 DIAGNOSIS — R079 Chest pain, unspecified: Secondary | ICD-10-CM

## 2015-11-25 DIAGNOSIS — E785 Hyperlipidemia, unspecified: Secondary | ICD-10-CM | POA: Diagnosis present

## 2015-11-25 DIAGNOSIS — Z6841 Body Mass Index (BMI) 40.0 and over, adult: Secondary | ICD-10-CM | POA: Diagnosis not present

## 2015-11-25 DIAGNOSIS — I429 Cardiomyopathy, unspecified: Secondary | ICD-10-CM | POA: Diagnosis present

## 2015-11-25 DIAGNOSIS — G4733 Obstructive sleep apnea (adult) (pediatric): Secondary | ICD-10-CM | POA: Diagnosis present

## 2015-11-25 DIAGNOSIS — F200 Paranoid schizophrenia: Secondary | ICD-10-CM | POA: Diagnosis present

## 2015-11-25 DIAGNOSIS — J441 Chronic obstructive pulmonary disease with (acute) exacerbation: Secondary | ICD-10-CM | POA: Diagnosis not present

## 2015-11-25 DIAGNOSIS — F17210 Nicotine dependence, cigarettes, uncomplicated: Secondary | ICD-10-CM | POA: Diagnosis present

## 2015-11-25 DIAGNOSIS — E089 Diabetes mellitus due to underlying condition without complications: Secondary | ICD-10-CM | POA: Diagnosis not present

## 2015-11-25 DIAGNOSIS — J9602 Acute respiratory failure with hypercapnia: Secondary | ICD-10-CM | POA: Diagnosis present

## 2015-11-25 DIAGNOSIS — G4730 Sleep apnea, unspecified: Secondary | ICD-10-CM

## 2015-11-25 DIAGNOSIS — I5041 Acute combined systolic (congestive) and diastolic (congestive) heart failure: Secondary | ICD-10-CM | POA: Diagnosis present

## 2015-11-25 DIAGNOSIS — R0602 Shortness of breath: Secondary | ICD-10-CM | POA: Diagnosis not present

## 2015-11-25 DIAGNOSIS — R0902 Hypoxemia: Secondary | ICD-10-CM | POA: Diagnosis not present

## 2015-11-25 DIAGNOSIS — I420 Dilated cardiomyopathy: Secondary | ICD-10-CM | POA: Diagnosis not present

## 2015-11-25 HISTORY — DX: Morbid (severe) obesity due to excess calories: E66.01

## 2015-11-25 LAB — BLOOD GAS, ARTERIAL
Acid-Base Excess: 4.7 mmol/L — ABNORMAL HIGH (ref 0.0–2.0)
Acid-Base Excess: 6.3 mmol/L — ABNORMAL HIGH (ref 0.0–2.0)
BICARBONATE: 35.4 mmol/L — AB (ref 20.0–28.0)
Bicarbonate: 32.3 mmol/L — ABNORMAL HIGH (ref 20.0–28.0)
DRAWN BY: 422461
Drawn by: 441261
FIO2: 40
LHR: 8 {breaths}/min
Mode: POSITIVE
O2 CONTENT: 8 L/min
O2 SAT: 96.7 %
O2 Saturation: 95 %
PATIENT TEMPERATURE: 98.3
PATIENT TEMPERATURE: 37
PCO2 ART: 79.4 mmHg — AB (ref 32.0–48.0)
PEEP/CPAP: 5 cmH2O
PH ART: 7.272 — AB (ref 7.350–7.450)
PO2 ART: 104 mmHg (ref 83.0–108.0)
PO2 ART: 92.2 mmHg (ref 83.0–108.0)
PRESSURE CONTROL: 5 cmH2O
pCO2 arterial: 66.5 mmHg (ref 32.0–48.0)
pH, Arterial: 7.306 — ABNORMAL LOW (ref 7.350–7.450)

## 2015-11-25 LAB — RESPIRATORY PANEL BY PCR
Adenovirus: NOT DETECTED
Bordetella pertussis: NOT DETECTED
CORONAVIRUS OC43-RVPPCR: NOT DETECTED
Chlamydophila pneumoniae: NOT DETECTED
Coronavirus 229E: NOT DETECTED
Coronavirus HKU1: NOT DETECTED
Coronavirus NL63: NOT DETECTED
INFLUENZA A-RVPPCR: NOT DETECTED
INFLUENZA B-RVPPCR: NOT DETECTED
METAPNEUMOVIRUS-RVPPCR: NOT DETECTED
MYCOPLASMA PNEUMONIAE-RVPPCR: NOT DETECTED
PARAINFLUENZA VIRUS 1-RVPPCR: NOT DETECTED
PARAINFLUENZA VIRUS 4-RVPPCR: NOT DETECTED
Parainfluenza Virus 2: NOT DETECTED
Parainfluenza Virus 3: NOT DETECTED
RESPIRATORY SYNCYTIAL VIRUS-RVPPCR: NOT DETECTED
RHINOVIRUS / ENTEROVIRUS - RVPPCR: NOT DETECTED

## 2015-11-25 LAB — CBC WITH DIFFERENTIAL/PLATELET
BASOS PCT: 0 %
Basophils Absolute: 0 10*3/uL (ref 0.0–0.1)
Basophils Absolute: 0 10*3/uL (ref 0.0–0.1)
Basophils Relative: 0 %
EOS ABS: 0 10*3/uL (ref 0.0–0.7)
EOS PCT: 0 %
Eosinophils Absolute: 0 10*3/uL (ref 0.0–0.7)
Eosinophils Relative: 0 %
HCT: 42.6 % (ref 39.0–52.0)
HEMATOCRIT: 38.9 % — AB (ref 39.0–52.0)
HEMOGLOBIN: 12.4 g/dL — AB (ref 13.0–17.0)
Hemoglobin: 11.4 g/dL — ABNORMAL LOW (ref 13.0–17.0)
Lymphocytes Relative: 24 %
Lymphocytes Relative: 11 %
Lymphs Abs: 1.5 10*3/uL (ref 0.7–4.0)
Lymphs Abs: 0.9 10*3/uL (ref 0.7–4.0)
MCH: 26.2 pg (ref 26.0–34.0)
MCH: 26.2 pg (ref 26.0–34.0)
MCHC: 29.3 g/dL — ABNORMAL LOW (ref 30.0–36.0)
MCHC: 29.1 g/dL — AB (ref 30.0–36.0)
MCV: 89.4 fL (ref 78.0–100.0)
MCV: 89.9 fL (ref 78.0–100.0)
MONO ABS: 0.5 10*3/uL (ref 0.1–1.0)
MONOS PCT: 2 %
Monocytes Absolute: 0.2 10*3/uL (ref 0.1–1.0)
Monocytes Relative: 8 %
NEUTROS ABS: 4.2 10*3/uL (ref 1.7–7.7)
NEUTROS PCT: 87 %
Neutro Abs: 6.9 10*3/uL (ref 1.7–7.7)
Neutrophils Relative %: 68 %
PLATELETS: 142 10*3/uL — AB (ref 150–400)
Platelets: 227 10*3/uL (ref 150–400)
RBC: 4.35 MIL/uL (ref 4.22–5.81)
RBC: 4.74 MIL/uL (ref 4.22–5.81)
RDW: 15.7 % — AB (ref 11.5–15.5)
RDW: 15.5 % (ref 11.5–15.5)
WBC: 6.2 10*3/uL (ref 4.0–10.5)
WBC: 8 10*3/uL (ref 4.0–10.5)

## 2015-11-25 LAB — TROPONIN I
TROPONIN I: 0.03 ng/mL — AB (ref <0.03)
Troponin I: <0.03 ng/mL (ref <0.03)
Troponin I: <0.03 ng/mL (ref <0.03)

## 2015-11-25 LAB — TSH: TSH: 1.684 u[IU]/mL (ref 0.350–4.500)

## 2015-11-25 LAB — COMPREHENSIVE METABOLIC PANEL
ALT: 18 U/L (ref 17–63)
ANION GAP: 7 (ref 5–15)
AST: 18 U/L (ref 15–41)
Albumin: 4.2 g/dL (ref 3.5–5.0)
Alkaline Phosphatase: 39 U/L (ref 38–126)
BILIRUBIN TOTAL: 0.6 mg/dL (ref 0.3–1.2)
BUN: 13 mg/dL (ref 6–20)
CO2: 35 mmol/L — ABNORMAL HIGH (ref 22–32)
Calcium: 9 mg/dL (ref 8.9–10.3)
Chloride: 99 mmol/L — ABNORMAL LOW (ref 101–111)
Creatinine, Ser: 1.18 mg/dL (ref 0.61–1.24)
GFR calc Af Amer: >60 mL/min (ref >60)
Glucose, Bld: 96 mg/dL (ref 65–99)
POTASSIUM: 4.5 mmol/L (ref 3.5–5.1)
Sodium: 141 mmol/L (ref 135–145)
TOTAL PROTEIN: 8 g/dL (ref 6.5–8.1)

## 2015-11-25 LAB — BASIC METABOLIC PANEL
ANION GAP: 6 (ref 5–15)
BUN: 13 mg/dL (ref 6–20)
CHLORIDE: 103 mmol/L (ref 101–111)
CO2: 30 mmol/L (ref 22–32)
Calcium: 8.6 mg/dL — ABNORMAL LOW (ref 8.9–10.3)
Creatinine, Ser: 1.16 mg/dL (ref 0.61–1.24)
GFR calc non Af Amer: >60 mL/min (ref >60)
Glucose, Bld: 138 mg/dL — ABNORMAL HIGH (ref 65–99)
POTASSIUM: 4.1 mmol/L (ref 3.5–5.1)
Sodium: 139 mmol/L (ref 135–145)

## 2015-11-25 LAB — GLUCOSE, CAPILLARY
GLUCOSE-CAPILLARY: 141 mg/dL — AB (ref 65–99)
Glucose-Capillary: 201 mg/dL — ABNORMAL HIGH (ref 65–99)

## 2015-11-25 LAB — STREP PNEUMONIAE URINARY ANTIGEN: STREP PNEUMO URINARY ANTIGEN: NEGATIVE

## 2015-11-25 LAB — MAGNESIUM: MAGNESIUM: 1.8 mg/dL (ref 1.7–2.4)

## 2015-11-25 LAB — EXPECTORATED SPUTUM ASSESSMENT W REFEX TO RESP CULTURE

## 2015-11-25 LAB — BRAIN NATRIURETIC PEPTIDE: B Natriuretic Peptide: 199.2 pg/mL — ABNORMAL HIGH (ref 0.0–100.0)

## 2015-11-25 LAB — PROTIME-INR
INR: 1.14
PROTHROMBIN TIME: 14.6 s (ref 11.4–15.2)

## 2015-11-25 LAB — CBG MONITORING, ED: GLUCOSE-CAPILLARY: 139 mg/dL — AB (ref 65–99)

## 2015-11-25 LAB — EXPECTORATED SPUTUM ASSESSMENT W GRAM STAIN, RFLX TO RESP C

## 2015-11-25 LAB — APTT: APTT: 25 s (ref 24–36)

## 2015-11-25 LAB — MRSA PCR SCREENING: MRSA by PCR: NEGATIVE

## 2015-11-25 LAB — PROCALCITONIN: Procalcitonin: <0.1 ng/mL

## 2015-11-25 LAB — ECHOCARDIOGRAM COMPLETE

## 2015-11-25 LAB — PHOSPHORUS: Phosphorus: 5 mg/dL — ABNORMAL HIGH (ref 2.5–4.6)

## 2015-11-25 MED ORDER — RISPERIDONE 1 MG PO TABS: 1.0000 mg | ORAL_TABLET | Freq: Every day | ORAL | Status: DC | PRN

## 2015-11-25 MED ORDER — IPRATROPIUM-ALBUTEROL 0.5-2.5 (3) MG/3ML IN SOLN: 3.0000 mL | RESPIRATORY_TRACT | Status: DC | PRN

## 2015-11-25 MED ORDER — LEVOFLOXACIN IN D5W 750 MG/150ML IV SOLN
750.0000 mg | INTRAVENOUS | Status: DC
  Administered 2015-11-25 – 2015-11-27 (×3): 750 mg via INTRAVENOUS
  Filled 2015-11-25 (×3): qty 150

## 2015-11-25 MED ORDER — MELOXICAM 7.5 MG PO TABS
7.5000 mg | ORAL_TABLET | Freq: Every day | ORAL | Status: DC
  Administered 2015-11-26 – 2015-11-28 (×3): 7.5 mg via ORAL
  Filled 2015-11-25 (×3): qty 1

## 2015-11-25 MED ORDER — LURASIDONE HCL 40 MG PO TABS: 40.0000 mg | ORAL_TABLET | Freq: Every day | ORAL | Status: DC

## 2015-11-25 MED ORDER — LORATADINE 10 MG PO TABS
10.0000 mg | ORAL_TABLET | Freq: Every day | ORAL | Status: DC
  Administered 2015-11-26 – 2015-11-28 (×3): 10 mg via ORAL
  Filled 2015-11-25 (×3): qty 1

## 2015-11-25 MED ORDER — SODIUM CHLORIDE 0.9 % IV SOLN
INTRAVENOUS | Status: DC
  Administered 2015-11-25: 13:00:00 via INTRAVENOUS

## 2015-11-25 MED ORDER — ONDANSETRON HCL 4 MG/2ML IJ SOLN: 4.0000 mg | Freq: Four times a day (QID) | INTRAMUSCULAR | Status: DC | PRN

## 2015-11-25 MED ORDER — FUROSEMIDE 10 MG/ML IJ SOLN
40.0000 mg | INTRAMUSCULAR | Status: AC
  Administered 2015-11-25: 40 mg via INTRAVENOUS
  Filled 2015-11-25: qty 4

## 2015-11-25 MED ORDER — LURASIDONE HCL 80 MG PO TABS: 80.0000 mg | ORAL_TABLET | Freq: Every day | ORAL | Status: DC

## 2015-11-25 MED ORDER — ONDANSETRON HCL 4 MG PO TABS: 4.0000 mg | ORAL_TABLET | Freq: Four times a day (QID) | ORAL | Status: DC | PRN

## 2015-11-25 MED ORDER — IPRATROPIUM-ALBUTEROL 0.5-2.5 (3) MG/3ML IN SOLN
3.0000 mL | Freq: Three times a day (TID) | RESPIRATORY_TRACT | Status: DC
  Administered 2015-11-26 – 2015-11-27 (×3): 3 mL via RESPIRATORY_TRACT
  Filled 2015-11-25 (×4): qty 3

## 2015-11-25 MED ORDER — SIMVASTATIN 10 MG PO TABS
20.0000 mg | ORAL_TABLET | Freq: Every day | ORAL | Status: DC
  Administered 2015-11-25 – 2015-11-28 (×4): 20 mg via ORAL
  Filled 2015-11-25 (×5): qty 2

## 2015-11-25 MED ORDER — METHYLPREDNISOLONE SODIUM SUCC 125 MG IJ SOLR
125.0000 mg | Freq: Once | INTRAMUSCULAR | Status: AC
  Administered 2015-11-25: 125 mg via INTRAVENOUS
  Filled 2015-11-25: qty 2

## 2015-11-25 MED ORDER — CHLORHEXIDINE GLUCONATE 0.12 % MT SOLN
15.0000 mL | Freq: Two times a day (BID) | OROMUCOSAL | Status: DC
  Administered 2015-11-25 – 2015-11-28 (×6): 15 mL via OROMUCOSAL
  Filled 2015-11-25 (×6): qty 15

## 2015-11-25 MED ORDER — ACETAMINOPHEN 650 MG RE SUPP: 650.0000 mg | Freq: Four times a day (QID) | RECTAL | Status: DC | PRN

## 2015-11-25 MED ORDER — PANTOPRAZOLE SODIUM 40 MG PO TBEC
40.0000 mg | DELAYED_RELEASE_TABLET | Freq: Every day | ORAL | Status: DC
  Administered 2015-11-26 – 2015-11-28 (×3): 40 mg via ORAL
  Filled 2015-11-25 (×3): qty 1

## 2015-11-25 MED ORDER — DIVALPROEX SODIUM ER 500 MG PO TB24
1000.0000 mg | ORAL_TABLET | Freq: Every day | ORAL | Status: DC
Start: 2015-11-25 — End: 2015-11-28
  Administered 2015-11-25 – 2015-11-27 (×3): 1000 mg via ORAL
  Filled 2015-11-25: qty 4
  Filled 2015-11-25 (×2): qty 2

## 2015-11-25 MED ORDER — OXYBUTYNIN CHLORIDE 5 MG PO TABS
5.0000 mg | ORAL_TABLET | Freq: Every day | ORAL | Status: DC
  Administered 2015-11-25 – 2015-11-28 (×4): 5 mg via ORAL
  Filled 2015-11-25 (×4): qty 1

## 2015-11-25 MED ORDER — PERFLUTREN LIPID MICROSPHERE
1.0000 mL | INTRAVENOUS | Status: AC | PRN
  Administered 2015-11-25: 2 mL via INTRAVENOUS
  Filled 2015-11-25: qty 10

## 2015-11-25 MED ORDER — MODAFINIL 200 MG PO TABS
200.0000 mg | ORAL_TABLET | Freq: Every day | ORAL | Status: DC
  Administered 2015-11-26 – 2015-11-28 (×3): 200 mg via ORAL
  Filled 2015-11-25 (×3): qty 1

## 2015-11-25 MED ORDER — CLOZAPINE 25 MG PO TABS
450.0000 mg | ORAL_TABLET | Freq: Every day | ORAL | Status: DC
  Administered 2015-11-25 – 2015-11-27 (×3): 450 mg via ORAL
  Filled 2015-11-25: qty 5
  Filled 2015-11-25 (×3): qty 2

## 2015-11-25 MED ORDER — INSULIN ASPART 100 UNIT/ML ~~LOC~~ SOLN
0.0000 [IU] | Freq: Three times a day (TID) | SUBCUTANEOUS | Status: DC
Start: 2015-11-25 — End: 2015-11-28
  Administered 2015-11-25: 3 [IU] via SUBCUTANEOUS
  Administered 2015-11-26 (×2): 1 [IU] via SUBCUTANEOUS
  Administered 2015-11-26: 2 [IU] via SUBCUTANEOUS
  Administered 2015-11-27 – 2015-11-28 (×2): 3 [IU] via SUBCUTANEOUS

## 2015-11-25 MED ORDER — INSULIN ASPART 100 UNIT/ML ~~LOC~~ SOLN: 0.0000 [IU] | Freq: Every day | SUBCUTANEOUS | Status: DC

## 2015-11-25 MED ORDER — METFORMIN HCL 500 MG PO TABS: 1000.0000 mg | ORAL_TABLET | Freq: Two times a day (BID) | ORAL | Status: DC

## 2015-11-25 MED ORDER — ALBUTEROL SULFATE (2.5 MG/3ML) 0.083% IN NEBU
5.0000 mg | INHALATION_SOLUTION | Freq: Once | RESPIRATORY_TRACT | Status: AC
  Administered 2015-11-25: 5 mg via RESPIRATORY_TRACT
  Filled 2015-11-25: qty 6

## 2015-11-25 MED ORDER — IPRATROPIUM-ALBUTEROL 0.5-2.5 (3) MG/3ML IN SOLN
3.0000 mL | RESPIRATORY_TRACT | Status: DC
  Administered 2015-11-25 (×2): 3 mL via RESPIRATORY_TRACT
  Filled 2015-11-25 (×2): qty 3

## 2015-11-25 MED ORDER — HYDRALAZINE HCL 20 MG/ML IJ SOLN
10.0000 mg | Freq: Four times a day (QID) | INTRAMUSCULAR | Status: DC | PRN
  Administered 2015-11-25 – 2015-11-28 (×2): 10 mg via INTRAVENOUS
  Filled 2015-11-25 (×4): qty 1

## 2015-11-25 MED ORDER — IPRATROPIUM-ALBUTEROL 0.5-2.5 (3) MG/3ML IN SOLN: 3.0000 mL | Freq: Four times a day (QID) | RESPIRATORY_TRACT | Status: DC

## 2015-11-25 MED ORDER — SODIUM CHLORIDE 0.9% FLUSH
3.0000 mL | Freq: Two times a day (BID) | INTRAVENOUS | Status: DC
  Administered 2015-11-25 (×2): 3 mL via INTRAVENOUS

## 2015-11-25 MED ORDER — LEVOTHYROXINE SODIUM 75 MCG PO TABS
75.0000 ug | ORAL_TABLET | Freq: Every day | ORAL | Status: DC
  Administered 2015-11-26 – 2015-11-28 (×3): 75 ug via ORAL
  Filled 2015-11-25 (×3): qty 1

## 2015-11-25 MED ORDER — ATENOLOL 25 MG PO TABS
100.0000 mg | ORAL_TABLET | Freq: Every day | ORAL | Status: DC
  Administered 2015-11-26 – 2015-11-28 (×3): 100 mg via ORAL
  Filled 2015-11-25 (×3): qty 4

## 2015-11-25 MED ORDER — LITHIUM CARBONATE ER 450 MG PO TBCR
450.0000 mg | EXTENDED_RELEASE_TABLET | Freq: Every day | ORAL | Status: DC
  Administered 2015-11-25 – 2015-11-27 (×3): 450 mg via ORAL
  Filled 2015-11-25 (×4): qty 1

## 2015-11-25 MED ORDER — METHYLPREDNISOLONE SODIUM SUCC 125 MG IJ SOLR
60.0000 mg | Freq: Two times a day (BID) | INTRAMUSCULAR | Status: DC
  Administered 2015-11-25 – 2015-11-26 (×2): 60 mg via INTRAVENOUS
  Filled 2015-11-25 (×2): qty 2

## 2015-11-25 MED ORDER — ADULT MULTIVITAMIN W/MINERALS CH
1.0000 | ORAL_TABLET | Freq: Every day | ORAL | Status: DC
  Administered 2015-11-26 – 2015-11-28 (×3): 1 via ORAL
  Filled 2015-11-25 (×3): qty 1

## 2015-11-25 MED ORDER — ORAL CARE MOUTH RINSE
15.0000 mL | Freq: Two times a day (BID) | OROMUCOSAL | Status: DC
  Administered 2015-11-25 – 2015-11-28 (×5): 15 mL via OROMUCOSAL

## 2015-11-25 MED ORDER — ACETAMINOPHEN 325 MG PO TABS
650.0000 mg | ORAL_TABLET | Freq: Four times a day (QID) | ORAL | Status: DC | PRN
  Filled 2015-11-25: qty 2

## 2015-11-25 NOTE — Consult Note (Signed)
Name: John [name]. MRN: [id number] DOB: 1975-01-15    ADMISSION DATE:  11/25/2015 CONSULTATION DATE:  11/25/2015  REFERRING MD :  Leisa Lenz, M.D. / [facility]  CHIEF COMPLAINT:  Acute Respiratory Failure   BRIEF PATIENT DESCRIPTION: 40 y.o. male with a known history of schizophrenia and asthma.   SIGNIFICANT EVENTS  11/24 - Admit w/ acute hypoxic respiratory failure  STUDIES:  Port CXR 11/24:  Personally reviewed by me. Her dynamic and slightly rotated left view. Questionable fluid wave left hemidiaphragm. Question will cardiomegaly. No focal opacity appreciated but there is some fullness of the right hilum.  MICROBIOLOGY: None  ANTIBIOTICS: Levaquin 11/24 >>  LINES/TUBES: PIV  HISTORY OF PRESENT ILLNESS:  40 y.o. male admitted by hospitalist service history obtained from [place] record as the patient is currently on BiPAP support. Patient currently lives in group home. Has known underlying sleep apnea as well as asthma but is not currently on noninvasive positive pressure ventilation. Patient reportedly has at least recent tobacco use with cigarettes. Reportedly after dinner last evening patient was complaining of dizziness and shortness of breath. Prior to this he was coughing for 2-3 days. Cough was productive of a reportedly whitish phlegm. No known fever. No known chest pain. No nausea or vomiting. Patient had no loss of consciousness. Patient did receive IV Solu-Medrol in the emergency department. He was given nebulizer treatments but ultimately required initiation of noninvasive positive pressure ventilation. I was consulted to assist in management of his pulmonary problems/complications.  PAST MEDICAL HISTORY :  Past Medical History:  Diagnosis Date  . Anemia   . Asthma   . Diabetes mellitus   . Obesity   . Paranoid schizophrenia ([place])   . Personality disorder   . Sleep apnea   . Thyroid disease     PAST SURGICAL HISTORY: Past Surgical History:    Procedure Laterality Date  . FRACTURE SURGERY      Prior to Admission medications   Medication Sig Start Date End Date Taking? Authorizing Provider  Armodafinil 250 MG tablet Take 250 mg by mouth daily.   Yes Historical Provider, MD  atenolol (TENORMIN) 100 MG tablet Take 100 mg by mouth daily.   Yes Historical Provider, MD  clonazePAM (KLONOPIN) 0.5 MG tablet Take 0.5 mg by mouth 3 (three) times daily.   Yes Historical Provider, MD  cloZAPine (CLOZARIL) 100 MG tablet Take 450 mg by mouth at bedtime.   Yes Historical Provider, MD  divalproex (DEPAKOTE ER) 500 MG 24 hr tablet Take 2,000 mg by mouth at bedtime.    Yes Historical Provider, MD  glipiZIDE (GLUCOTROL XL) 5 MG 24 hr tablet Take 5 mg by mouth 2 (two) times daily.   Yes Historical Provider, MD  levothyroxine (SYNTHROID, LEVOTHROID) 75 MCG tablet Take 75 mcg by mouth daily.   Yes Historical Provider, MD  lithium carbonate (ESKALITH) 450 MG CR tablet Take 450 mg by mouth at bedtime.   Yes Historical Provider, MD  loratadine (CLARITIN) 10 MG tablet Take 10 mg by mouth daily.   Yes Historical Provider, MD  meloxicam (MOBIC) 7.5 MG tablet Take 7.5 mg by mouth daily.   Yes Historical Provider, MD  metFORMIN (GLUCOPHAGE) 1000 MG tablet Take 1,000 mg by mouth 2 (two) times daily with a meal.   Yes Historical Provider, MD  Multiple Vitamins-Minerals (MULTIVITAMINS THER. W/MINERALS) TABS tablet Take 1 tablet by mouth daily.   Yes Historical Provider, MD  omeprazole (PRILOSEC) 20 MG capsule Take 20 mg  by mouth daily.   Yes Historical Provider, MD  oxybutynin (DITROPAN) 5 MG tablet Take 5 mg by mouth daily.   Yes Historical Provider, MD  simvastatin (ZOCOR) 20 MG tablet Take 20 mg by mouth at bedtime.    Yes Historical Provider, MD   No Known Allergies  FAMILY HISTORY:  Unable to obtain given BiPAP requirement.  SOCIAL HISTORY: Social History   Social History  . Marital status: Single    Spouse name: N/A  . Number of children: N/A  .  Years of education: N/A   Social History Main Topics  . Smoking status: Never Smoker  . Smokeless tobacco: Never Used  . Alcohol use No  . Drug use: No  . Sexual activity: Not Asked   Other Topics Concern  . None   Social History Narrative  . None    REVIEW OF SYSTEMS:  Unable to obtain with BiPAP mask in place.  SUBJECTIVE:  As above.  VITAL SIGNS: Temp:  [98.3 F (36.8 C)-98.4 F (36.9 C)] 98.4 F (36.9 C) (11/24 0816) Pulse Rate:  [94-104] 94 (11/24 1102) Resp:  [14-26] 25 (11/24 1102) BP: (133-153)/(84-90) 133/84 (11/24 0845) SpO2:  [83 %-100 %] 96 % (11/24 1102) FiO2 (%):  [30 %-40 %] 30 % (11/24 1102)  PHYSICAL EXAMINATION: General:  Obese. No acute distress. No family at bedside.  Integument:  Warm & dry. No rash on exposed skin.  Lymphatics:  No appreciated cervical or supraclavicular lymphadenoapthy. HEENT:  No scleral injection or icterus.BiPAP mask in place. Cardiovascular:  Regular rate. Pitting lower extremity edema. No appreciable JVD given body habitus.  Pulmonary:  Decreased aeration bilaterally with wheezing on exhalation phase. Normal work of breathing on BiPAP. Abdomen: Soft. Normal bowel sounds. Protuberant. Nontender. Musculoskeletal:  Normal bulk and tone. No joint deformity or effusion appreciated. Neurological:  No meningismus. Moving all 4 extremities equally. Following commands. Psychiatric:  Unable to assess with BiPAP mask in place.   Recent Labs Lab 11/25/15 0646  NA 139  K 4.1  CL 103  CO2 30  BUN 13  CREATININE 1.16  GLUCOSE 138*    Recent Labs Lab 11/25/15 0646  HGB 11.4*  HCT 38.9*  WBC 6.2  PLT 227   Dg Chest Port 1 View  Result Date: 11/25/2015 CLINICAL DATA:  Shortness of breath. EXAM: PORTABLE CHEST 1 VIEW COMPARISON:  05/17/2014. FINDINGS: Mediastinum normal. Cardiomegaly. Low lung volumes. Mild right base infiltrate/edema. No pleural effusion or pneumothorax . IMPRESSION: 1.  Cardiomegaly. 2.  Low lung volumes.   Mild right base infiltrate/edema . Electronically Signed   By: Marcello Moores  Register   On: 11/25/2015 07:12    ASSESSMENT / PLAN:  40 y.o. with history of recent cigarette use and known underlying asthma. Presents with acute hypercarbic respiratory failure & acute hypoxic respiratory failure requiring noninvasive positive pressure ventilation. Patient's ABG does suggest chronic hypercarbia but serum blood work does not show any metabolic compensation. The patient would benefit from further diuresis and nebulizer treatments. No indication for endotracheal intubation at this time but will require close monitoring in the intensive care unit for clinical worsening which could necessitate endotracheal intubation needed.  1. Acute Hypercarbic Respiratory Failure: Patient will need outpatient full pulmonary function testing after discharge. Continuing Solu-Medrol IV every 12 hours. Increasing scheduled nebs to every 4 hours. Holding on further ABGs pending worsening mental status. 2. Acute Hypoxic Respiratory Failure: Agree with diuresis as renal function and blood pressure allow. 3. Asthma/COPD Exacerbation: Continuing patient on Solu-Medrol  and Duonebs as noted above. 4. Acute Bronchitis/Pneumonia: Infectious workup initiated by hospitalist. Agree with Levaquin empirically for now. 5. OSA:  Not currently on NIPPV at home. Continuing on BiPAP for now. Patient should be set for BiPAP/CPAP at home as previously planned.  Remainder of care as per primary service.   Sonia Baller Ashok Cordia, M.D. [facility] Pager:  [phone] After 3pm or if no response, call [phone] 11/25/2015, 11:09 AM

## 2015-11-25 NOTE — ED Provider Notes (Signed)
[facility] DEPT Provider Note   CSN: [id number] Arrival date & time: 11/25/15  [id number]     History   Chief Complaint Chief Complaint  Patient presents with  . Shortness of Breath    HPI John [name]. is a 40 y.o. male.  The history is provided by the patient and medical records.  Shortness of Breath  Associated symptoms include cough.    40 y.o. M with hx of anemia, asthma, DM, obesity, paranoid schizophrenia, sleep apnea, thyroid disease, presenting to the ED for SOB.  Patient lives at a group home in [place] and went to visit his mother for Thanksgiving on Wednesday. She states she coughed all night Wednesday night. He seemed to be doing okay yesterday during the day, but yesterday afternoon was complaining of some dizziness. He did not have any episodes of syncope.  States he coughed again all night last night and was complaining of shortness of breath this morning with wheezing. States he generally does breathing treatments and uses inhalers daily at his group home. Mother reports he is diabetic, however does not understand how to check his blood sugar.  Patient does have history of sleep apnea. His prior CPAP machine is broken. He had a recent sleep study about 2 weeks ago, is waiting to be fitted for new CPAP machine so he has not had this since.  Past Medical History:  Diagnosis Date  . Anemia   . Asthma   . Diabetes mellitus   . Obesity   . Paranoid schizophrenia ([place])   . Personality disorder   . Sleep apnea   . Thyroid disease     Patient Active Problem List   Diagnosis Date Noted  . MORBID OBESITY 09/29/2008  . OBSTRUCTIVE SLEEP APNEA 09/29/2008  . HYPERTENSION 09/29/2008  . HYPERSOMNIA 09/29/2008    Past Surgical History:  Procedure Laterality Date  . FRACTURE SURGERY         Home Medications    Prior to Admission medications   Medication Sig Start Date End Date Taking? Authorizing Provider  atenolol (TENORMIN) 100 MG tablet Take 100 mg by  mouth daily.    Historical Provider, MD  clozapine (CLOZARIL) 200 MG tablet Take 200 mg by mouth daily.     Historical Provider, MD  divalproex (DEPAKOTE ER) 500 MG 24 hr tablet Take 1,000 mg by mouth at bedtime.    Historical Provider, MD  levothyroxine (SYNTHROID, LEVOTHROID) 75 MCG tablet Take 75 mcg by mouth daily.    Historical Provider, MD  loratadine (CLARITIN) 10 MG tablet Take 10 mg by mouth daily.    Historical Provider, MD  lurasidone (LATUDA) 40 MG TABS Take 40 mg by mouth daily with breakfast. Takes with 80mg  for total of 120mg     Historical Provider, MD  lurasidone (LATUDA) 80 MG TABS Take 80 mg by mouth daily with breakfast. Takes with 40 mg for a total of 120mg     Historical Provider, MD  metFORMIN (GLUCOPHAGE) 1000 MG tablet Take 1,000 mg by mouth 2 (two) times daily with a meal.    Historical Provider, MD  Multiple Vitamin (MULTIVITAMIN WITH MINERALS) TABS Take 1 tablet by mouth daily.    Historical Provider, MD  omeprazole (PRILOSEC) 20 MG capsule Take 20 mg by mouth daily.    Historical Provider, MD  oxybutynin (DITROPAN) 5 MG tablet Take 5 mg by mouth daily.    Historical Provider, MD  risperiDONE (RISPERDAL) 1 MG tablet Take 1 mg by mouth daily as needed. For  agitation    Historical Provider, MD  simvastatin (ZOCOR) 20 MG tablet Take 20 mg by mouth daily.    Historical Provider, MD    Family History History reviewed. No pertinent family history.  Social History Social History  Substance Use Topics  . Smoking status: Never Smoker  . Smokeless tobacco: Never Used  . Alcohol use No     Allergies   Patient has no known allergies.   Review of Systems Review of Systems  Respiratory: Positive for cough and shortness of breath.   All other systems reviewed and are negative.    Physical Exam Updated Vital Signs BP 153/90 (BP Location: Left Arm)   Pulse 104   Temp 98.3 F (36.8 C) (Oral)   Resp 26   SpO2 (!) 83%   Physical Exam  Constitutional: He is  oriented to person, place, and time. He appears well-developed and well-nourished.  Morbidly obese, appears sleepy at first but is immediately arousable to verbal and tactile stimuli  HENT:  Head: Normocephalic and atraumatic.  Mouth/Throat: Oropharynx is clear and moist.  Eyes: Conjunctivae and EOM are normal. Pupils are equal, round, and reactive to light.  Neck: Normal range of motion.  Cardiovascular: Normal rate, regular rhythm and normal heart sounds.   Pulmonary/Chest: Breath sounds normal. Accessory muscle usage present. Tachypnea noted. He is in respiratory distress.  Lungs are extremely tight, limited air movement heart bilaterally, no overt wheezes or rales  Abdominal: Soft. Bowel sounds are normal. There is no tenderness. There is no rebound.  Musculoskeletal: Normal range of motion.  3+ pitting edema of the legs  Neurological: He is alert and oriented to person, place, and time.  Skin: Skin is warm and dry.  Psychiatric: He has a normal mood and affect.  Nursing note and vitals reviewed.    ED Treatments / Results  Labs (all labs ordered are listed, but only abnormal results are displayed) Labs Reviewed  CBC WITH DIFFERENTIAL/PLATELET - Abnormal; Notable for the following:       Result Value   Hemoglobin 11.4 (*)    HCT 38.9 (*)    MCHC 29.3 (*)    RDW 15.7 (*)    All other components within normal limits  BASIC METABOLIC PANEL - Abnormal; Notable for the following:    Glucose, Bld 138 (*)    Calcium 8.6 (*)    All other components within normal limits  BLOOD GAS, ARTERIAL - Abnormal; Notable for the following:    pH, Arterial 7.306 (*)    pCO2 arterial 66.5 (*)    Bicarbonate 32.3 (*)    Acid-Base Excess 4.7 (*)    All other components within normal limits  CBG MONITORING, ED - Abnormal; Notable for the following:    Glucose-Capillary 139 (*)    All other components within normal limits  BRAIN NATRIURETIC PEPTIDE  I-STAT TROPOININ, ED  CBG MONITORING, ED     EKG  EKG Interpretation None       Radiology No results found.  Procedures Procedures (including critical care time)  CRITICAL CARE Performed by: Larene Pickett   Total critical care time: 45 minutes  Critical care time was exclusive of separately billable procedures and treating other patients.  Critical care was necessary to treat or prevent imminent or life-threatening deterioration.  Critical care was time spent personally by me on the following activities: development of treatment plan with patient and/or surrogate as well as nursing, discussions with consultants, evaluation of patient's response to treatment,  examination of patient, obtaining history from patient or surrogate, ordering and performing treatments and interventions, ordering and review of laboratory studies, ordering and review of radiographic studies, pulse oximetry and re-evaluation of patient's condition.   Medications Ordered in ED Medications  methylPREDNISolone sodium succinate (SOLU-MEDROL) 125 mg/2 mL injection 60 mg (not administered)  ipratropium-albuterol (DUONEB) 0.5-2.5 (3) MG/3ML nebulizer solution 3 mL (not administered)  ipratropium-albuterol (DUONEB) 0.5-2.5 (3) MG/3ML nebulizer solution 3 mL (3 mLs Nebulization Not Given 11/25/15 0916)  levofloxacin (LEVAQUIN) IVPB 750 mg (not administered)  albuterol (PROVENTIL) (2.5 MG/3ML) 0.083% nebulizer solution 5 mg (5 mg Nebulization Given 11/25/15 0647)  methylPREDNISolone sodium succinate (SOLU-MEDROL) 125 mg/2 mL injection 125 mg (125 mg Intravenous Given 11/25/15 0646)  furosemide (LASIX) injection 40 mg (40 mg Intravenous Given 11/25/15 0836)     Initial Impression / Assessment and Plan / ED Course  I have reviewed the triage vital signs and the nursing notes.  Pertinent labs & imaging results that were available during my care of the patient were reviewed by me and considered in my medical decision making (see chart for  details).  Clinical Course    40 year old male here with shortness of breath.  Patient is hypoxic to 83% on arrival to ED. He appears sleepy but is immediately arousable to verbal and tactile stimuli. He does not appear confused or disoriented. On exam, his lungs are extremely tight but no audible wheezes, rhonchi, or rails. He was started on DuoNeb and given Solu-Medrol given his history of asthma. He does appear to have significant peripheral pitting edema. Labs pending including BNP, troponin, and blood gas. Portable x-ray pending.  Patient workup revealing mixed picture of likely asthma exacerbation as well as new onset CHF. His BNP is elevated at 200. His blood gas reveals hypercapnia. He was started on BiPAP with improvement of this. He is tolerating well currently.  Chest x-ray with questionable right base infiltrate versus edema. Mom reports dry cough, no fever or leukocytosis here.  Given the clinical picture I favor edema. Patient was given dose of Lasix. He will be admitted to stepdown unit for further management. Discussed with Dr. Charlies Silvers of hospitalist service who agrees.  Temp admit orders placed.  Final Clinical Impressions(s) / ED Diagnoses   Final diagnoses:  SOB (shortness of breath)  Hypoxia  Acute congestive heart failure, unspecified congestive heart failure type [facility])    New Prescriptions New Prescriptions   No medications on file     Kathryne Hitch [id number] [phone]    David Glick, MD [id number]

## 2015-11-25 NOTE — H&P (Addendum)
History and Physical    John [name]. [id number] DOB: Aug 25, 1975 DOA: 11/25/2015  Referring MD/NP/PA: Dr. Delora Fuel  PCP: Cletis [place], MD   Outpatient Specialists: Pulmonary - Dr. Raul Del, [place]  Patient coming from: home but he is from group home   Chief Complaint: dizziness, shortness of breath and coughing   HPI: John [name]. is a 40 y.o. male with medical history significant for schizophrenia for which reason he is in group home, morbid obesity, diabetes, asthma, sleep apnea. Because of patient's mental history he is not a good historian but his grandmother at the bedside provided some details of medical illness. She is not sure of patient's smoking history but says that he has been experimenting with smoking cigarettes although she is not aware that he is a smoker. Patient was in a group home and for Thanksgiving his grandma picked him up to go to dinner. After the dinner patient complained of dizziness and shortness of breath. He laid down for little bit but this did not improve the symptoms. Apparently he has been coughing for 2-3 days prior to this admission per patient's grandmother report. He also had some intermittent productive cough, whitish sputum. No reports of fevers. No reports of chest pain. No abdominal pain, nausea or vomiting. No lightheadedness or loss of consciousness.   ED Course: In ED, BP was 153/90, HR 104, RR 20-26, T max 98.3 F, oxygen saturation 83% on room air. On BiPAP he is saturating 97%. Blood work showed hemoglobin of 11.4, normal creatinine. CXR showed cardiomegaly, right base infiltrate / edema. BNP was 190 range. ABG showed pH of 7.31, CO2 67, pO2 104. He was given Solu-Medrol 125 mg one-time dose, DuoNeb nebulizer 2. Because he is on BiPAP he is admitted to stepdown unit.   Review of Systems:  Constitutional: Negative for fever, chills, diaphoresis, activity change, appetite change and fatigue.  HENT: Negative for ear pain, nosebleeds,  congestion, facial swelling, rhinorrhea, neck pain, neck stiffness and ear discharge.   Eyes: Negative for pain, discharge, redness, itching and visual disturbance.  Respiratory: per HPI.   Cardiovascular: Negative for chest pain, palpitations and leg swelling.  Gastrointestinal: Negative for abdominal distention.  Genitourinary: Negative for dysuria, urgency, frequency, hematuria, flank pain, decreased urine volume, difficulty urinating and dyspareunia.  Musculoskeletal: Negative for back pain, joint swelling, arthralgias and gait problem.  Neurological: positive for dizziness, negative for tremors, seizures, syncope, facial asymmetry, speech difficulty, weakness, light-headedness, numbness and headaches.  Hematological: Negative for adenopathy. Does not bruise/bleed easily.  Psychiatric/Behavioral: Negative for hallucinations, behavioral problems, confusion, dysphoric mood, decreased concentration and agitation.   Past Medical History:  Diagnosis Date  . Anemia   . Asthma   . Diabetes mellitus   . Obesity   . Paranoid schizophrenia ([place])   . Personality disorder   . Sleep apnea   . Thyroid disease     Past Surgical History:  Procedure Laterality Date  . FRACTURE SURGERY      Social history:  reports that he has never smoked. He has never used smokeless tobacco. He reports that he does not drink alcohol or use drugs. Please note that pt grandmother   Ambulation ambulates without the assistance at baseline per pt grandmother report   No Known Allergies  Family hx: grandmother hypertension   Prior to Admission medications   Medication Sig Start Date End Date Taking? Authorizing Provider  atenolol (TENORMIN) 100 MG tablet Take 100 mg by mouth daily.    Historical Provider,  MD  clozapine (CLOZARIL) 200 MG tablet Take 200 mg by mouth daily.     Historical Provider, MD  divalproex (DEPAKOTE ER) 500 MG 24 hr tablet Take 1,000 mg by mouth at bedtime.    Historical Provider, MD    levothyroxine (SYNTHROID, LEVOTHROID) 75 MCG tablet Take 75 mcg by mouth daily.    Historical Provider, MD  loratadine (CLARITIN) 10 MG tablet Take 10 mg by mouth daily.    Historical Provider, MD  lurasidone (LATUDA) 40 MG TABS Take 40 mg by mouth daily with breakfast. Takes with 80mg  for total of 120mg     Historical Provider, MD  lurasidone (LATUDA) 80 MG TABS Take 80 mg by mouth daily with breakfast. Takes with 40 mg for a total of 120mg     Historical Provider, MD  metFORMIN (GLUCOPHAGE) 1000 MG tablet Take 1,000 mg by mouth 2 (two) times daily with a meal.    Historical Provider, MD  Multiple Vitamin (MULTIVITAMIN WITH MINERALS) TABS Take 1 tablet by mouth daily.    Historical Provider, MD  omeprazole (PRILOSEC) 20 MG capsule Take 20 mg by mouth daily.    Historical Provider, MD  oxybutynin (DITROPAN) 5 MG tablet Take 5 mg by mouth daily.    Historical Provider, MD  risperiDONE (RISPERDAL) 1 MG tablet Take 1 mg by mouth daily as needed. For agitation    Historical Provider, MD  simvastatin (ZOCOR) 20 MG tablet Take 20 mg by mouth daily.    Historical Provider, MD    Physical Exam: Vitals:   11/25/15 0645 11/25/15 0700 11/25/15 0807 11/25/15 0816  BP:  136/86  136/85  Pulse:  103 94 95  Resp:  20 14 20   Temp:    98.4 F (36.9 C)  TempSrc:    Oral  SpO2: 100% 97% 98% 97%    Constitutional: NAD, calm, comfortable Vitals:   11/25/15 0645 11/25/15 0700 11/25/15 0807 11/25/15 0816  BP:  136/86  136/85  Pulse:  103 94 95  Resp:  20 14 20   Temp:    98.4 F (36.9 C)  TempSrc:    Oral  SpO2: 100% 97% 98% 97%   Eyes: PERRL, lids and conjunctivae normal ENMT: Mucous membranes are moist. Posterior pharynx clear of any exudate or lesions.Normal dentition. (+) on BiPAP Neck: normal, supple, no masses, no thyromegaly Respiratory: diminished, mild wheezing in upper lung lobes, on BiPAP Cardiovascular: Regular rate and rhythm, no murmurs / rubs / gallops. No extremity edema. 2+ pedal  pulses. No carotid bruits.  Abdomen: no tenderness, no masses palpated. No hepatosplenomegaly. Bowel sounds positive.  Musculoskeletal: no clubbing / cyanosis. LE +1-2 pitting edema  Skin: no rashes, lesions, ulcers. No induration Neurologic: CN 2-12 grossly intact. Sensation intact, DTR normal. Strength 5/5 in all 4.  Psychiatric: Alert and oriented x 3. Normal mood.    Labs on Admission: I have personally reviewed following labs and imaging studies  CBC:  Recent Labs Lab 11/25/15 0646  WBC 6.2  NEUTROABS 4.2  HGB 11.4*  HCT 38.9*  MCV 89.4  PLT [id number]   Basic Metabolic Panel:  Recent Labs Lab 11/25/15 0646  NA 139  K 4.1  CL 103  CO2 30  GLUCOSE 138*  BUN 13  CREATININE 1.16  CALCIUM 8.6*   GFR: CrCl cannot be calculated (Unknown ideal weight.). Liver Function Tests: No results for input(s): AST, ALT, ALKPHOS, BILITOT, PROT, ALBUMIN in the last 168 hours. No results for input(s): LIPASE, AMYLASE in the last 168 hours. No  results for input(s): AMMONIA in the last 168 hours. Coagulation Profile: No results for input(s): INR, PROTIME in the last 168 hours. Cardiac Enzymes: No results for input(s): CKTOTAL, CKMB, CKMBINDEX, TROPONINI in the last 168 hours. BNP (last 3 results) No results for input(s): PROBNP in the last 8760 hours. HbA1C: No results for input(s): HGBA1C in the last 72 hours. CBG:  Recent Labs Lab 11/25/15 0640  GLUCAP 139*   Lipid Profile: No results for input(s): CHOL, HDL, LDLCALC, TRIG, CHOLHDL, LDLDIRECT in the last 72 hours. Thyroid Function Tests: No results for input(s): TSH, T4TOTAL, FREET4, T3FREE, THYROIDAB in the last 72 hours. Anemia Panel: No results for input(s): VITAMINB12, FOLATE, FERRITIN, TIBC, IRON, RETICCTPCT in the last 72 hours. Urine analysis:    Component Value Date/Time   COLORURINE Yellow 04/09/2014 1215   COLORURINE YELLOW 10/23/2011 1845   APPEARANCEUR Clear 04/09/2014 1215   LABSPEC 1.015 04/09/2014 1215    PHURINE 6.0 04/09/2014 1215   PHURINE 6.0 10/23/2011 1845   GLUCOSEU Negative 04/09/2014 1215   HGBUR Negative 04/09/2014 1215   HGBUR NEGATIVE 10/23/2011 1845   BILIRUBINUR Negative 04/09/2014 1215   KETONESUR Negative 04/09/2014 1215   KETONESUR NEGATIVE 10/23/2011 1845   PROTEINUR Negative 04/09/2014 1215   PROTEINUR NEGATIVE 10/23/2011 1845   UROBILINOGEN 1.0 10/23/2011 1845   NITRITE Negative 04/09/2014 1215   NITRITE NEGATIVE 10/23/2011 1845   LEUKOCYTESUR Negative 04/09/2014 1215   Sepsis Labs: @LABRCNTIP (procalcitonin:4,lacticidven:4) )No results found for this or any previous visit (from the past 240 hour(s)).   Radiological Exams on Admission: Dg Chest Port 1 View  Result Date: 11/25/2015 CLINICAL DATA:  Shortness of breath. EXAM: PORTABLE CHEST 1 VIEW COMPARISON:  05/17/2014. FINDINGS: Mediastinum normal. Cardiomegaly. Low lung volumes. Mild right base infiltrate/edema. No pleural effusion or pneumothorax . IMPRESSION: 1.  Cardiomegaly. 2.  Low lung volumes.  Mild right base infiltrate/edema . Electronically Signed   By: Marcello Moores  Register   On: 11/25/2015 07:12    EKG: sinus tachycardia   Assessment/Plan  Principal Problem:   Acute respiratory failure with hypoxia ([name]) /  Acute exacerbation of COPD with asthma ([place]) / possible acute diastolic CHF - Patient hypoxic on the admission with oxygen saturation 83% on room air - His oxygen saturation improved on BiPAP and he is currently saturating 97% - His mental status is good and he is tolerating BiPAP well - He has gotten solumedrol 125 mg IV in ED, we will continue Solu-Medrol 60 mg IV every 12 hours  - Patient has gotten 2 nebulizer treatments in ED and we will continue DuoNeb every 6 hours scheduled as well as every 4 hours as needed for shortness of breath or wheezing  - Please note patient has gotten 1 dose of IV Lasix in ED 40 mg. We have ordered 2-D echo for evaluation of possible CHF  Active Problems:   Right  lower lobe pneumonia  - Chest x-ray on admission showed a right base infiltrate/edema. Because patient is from group home it is reasonable to cover him empirically for possible pneumonia even without the fever or leukocytosis please note he did have ongoing productive cough for past few days prior to this admission  - Pneumonia order set utilized  - Follow-up blood culture results, sputum culture, HIV, strep pneumonia, Legionella and influenza       Morbid obesity ([place]) - Due to excess calories  - Nutrition consulted      Dyslipidemia associated with type 2 diabetes mellitus ([place]) - Continue statin therapy  Diabetes mellitus due to underlying condition without complication, without long-term current use of insulin ([name]) - Continue metformin     Essential hypertension - Continue atenolol     Hypothyroidism - Continue Synthroid     Paranoid schizophrenia, chronic condition ([place]) - Continue Latuda, clozapine and risperidone       DVT prophylaxis: SCD's bilaterally  Code Status: full code  Family Communication: grandmother at bedside  Disposition Plan: admission to SDU Consults called: none   Admission status: inpatient.  patient will require at least 3-4 days of inpatient hospital stay. Because of his hypoxia, oxygen saturation of 83% on room air and ABG showing CO2 of 67 patient requires BiPAP for which reason he needs to be placed in step down unit. Patient is being treated with IV Solu-Medrol twice daily 60 mg along with nebulizer treatments every 6 hours scheduled for possible COPD exacerbation. He also has quite an extensive lower extremity edema with mild elevation in BNP which could be because of early stages CHF. For this reason we have ordered 2-D echo and troponin level. For swelling he has gotten Lasix 40 mg IV in ED. Depending on echo results he may require cardiology consult and another IV Lasix dose.   Leisa Lenz MD [facility] Pager [phone]  If  7PM-7AM, please contact night-coverage [email or web address] Password [facility]  11/25/2015, 8:34 AM

## 2015-11-25 NOTE — ED Notes (Signed)
ABG Rt in room and collected

## 2015-11-25 NOTE — Progress Notes (Signed)
[facility] Clozapine program Update: Patient & MD registered; Labs entered and acceptable.  Per Clozapine [facility] program, acceptable to continue medication. [place] monitoring required weekly.    Netta Cedars, PharmD, BCPS Pager: [phone] 11/25/2015@11 :07 AM

## 2015-11-25 NOTE — ED Triage Notes (Signed)
Pt complains of being short of breath and wheezing since last night, he didn't have his inhaler or breathing treatments with him at his moms yesterday  Pt's O2 sat is low and patient is drowsy

## 2015-11-25 NOTE — Progress Notes (Signed)
  Echocardiogram 2D Echocardiogram has been performed.  John [name] 11/25/2015, 1:47 PM

## 2015-11-25 NOTE — Progress Notes (Signed)
CRITICAL VALUE ALERT  Critical value received:  Troponin 0.03  Date of notification:  11/25/15  Time of notification:  [id number]  Critical value read back:Yes.    Nurse who received alert:  Katherine Mantle RN  MD notified (1st page):  Charlies Silvers  Time of first page:  1231  MD notified (2nd page):  Time of second page:  Responding MD:  Charlies Silvers  Time MD responded:  [phone]

## 2015-11-25 NOTE — ED Notes (Signed)
Could not document urine output, patient spilled urinal in room.

## 2015-11-26 DIAGNOSIS — I10 Essential (primary) hypertension: Secondary | ICD-10-CM

## 2015-11-26 DIAGNOSIS — G4733 Obstructive sleep apnea (adult) (pediatric): Secondary | ICD-10-CM

## 2015-11-26 DIAGNOSIS — G4730 Sleep apnea, unspecified: Secondary | ICD-10-CM

## 2015-11-26 DIAGNOSIS — F200 Paranoid schizophrenia: Secondary | ICD-10-CM

## 2015-11-26 DIAGNOSIS — J9602 Acute respiratory failure with hypercapnia: Secondary | ICD-10-CM

## 2015-11-26 DIAGNOSIS — E038 Other specified hypothyroidism: Secondary | ICD-10-CM

## 2015-11-26 LAB — COMPREHENSIVE METABOLIC PANEL
ALBUMIN: 3.8 g/dL (ref 3.5–5.0)
ALK PHOS: 33 U/L — AB (ref 38–126)
ALT: 16 U/L — ABNORMAL LOW (ref 17–63)
ANION GAP: 6 (ref 5–15)
AST: 16 U/L (ref 15–41)
BILIRUBIN TOTAL: 0.5 mg/dL (ref 0.3–1.2)
BUN: 14 mg/dL (ref 6–20)
CALCIUM: 8.7 mg/dL — AB (ref 8.9–10.3)
CO2: 32 mmol/L (ref 22–32)
Chloride: 101 mmol/L (ref 101–111)
Creatinine, Ser: 0.96 mg/dL (ref 0.61–1.24)
GFR calc non Af Amer: >60 mL/min (ref >60)
Glucose, Bld: 128 mg/dL — ABNORMAL HIGH (ref 65–99)
POTASSIUM: 4.6 mmol/L (ref 3.5–5.1)
Sodium: 139 mmol/L (ref 135–145)
TOTAL PROTEIN: 7.2 g/dL (ref 6.5–8.1)

## 2015-11-26 LAB — LEGIONELLA PNEUMOPHILA SEROGP 1 UR AG: L. PNEUMOPHILA SEROGP 1 UR AG: NEGATIVE

## 2015-11-26 LAB — CBC
HEMATOCRIT: 39.9 % (ref 39.0–52.0)
HEMOGLOBIN: 11.8 g/dL — AB (ref 13.0–17.0)
MCH: 26.3 pg (ref 26.0–34.0)
MCHC: 29.6 g/dL — ABNORMAL LOW (ref 30.0–36.0)
MCV: 88.9 fL (ref 78.0–100.0)
Platelets: 256 10*3/uL (ref 150–400)
RBC: 4.49 MIL/uL (ref 4.22–5.81)
RDW: 15.5 % (ref 11.5–15.5)
WBC: 8.1 10*3/uL (ref 4.0–10.5)

## 2015-11-26 LAB — GLUCOSE, CAPILLARY
GLUCOSE-CAPILLARY: 152 mg/dL — AB (ref 65–99)
GLUCOSE-CAPILLARY: 138 mg/dL — AB (ref 65–99)
Glucose-Capillary: 134 mg/dL — ABNORMAL HIGH (ref 65–99)
Glucose-Capillary: 119 mg/dL — ABNORMAL HIGH (ref 65–99)

## 2015-11-26 LAB — HIV ANTIBODY (ROUTINE TESTING W REFLEX): HIV Screen 4th Generation wRfx: NONREACTIVE

## 2015-11-26 MED ORDER — METHYLPREDNISOLONE SODIUM SUCC 125 MG IJ SOLR
60.0000 mg | INTRAMUSCULAR | Status: DC
  Administered 2015-11-27: 60 mg via INTRAVENOUS
  Filled 2015-11-26: qty 2

## 2015-11-26 NOTE — Progress Notes (Signed)
Patient ID: John Castellano., male   DOB: 12-08-1975, 40 y.o.   MRN: [id number]  PROGRESS NOTE    John [name].  [id number] DOB: 1975/01/28 DOA: 11/25/2015  PCP: John [place], MD   Brief Narrative:  40 y.o. male with medical history significant for schizophrenia for which reason he is in group home, morbid obesity, diabetes, asthma, sleep apnea. Patient presented to [facility] long hospital with worsening shortness of breath, intermittently productive cough of whitish sputum, dizziness and weakness started few days prior to this admission.  In ED blood pressure was stable but oxygen saturation was 83% on room air. He was placed on BiPAP and admitted to stepdown unit. CXR showed cardiomegaly, right base infiltrate / edema. BNP was 190 range. ABG showed pH of 7.31, CO2 67, pO2 104.    Assessment & Plan:    Principal Problem:   Acute respiratory failure with hypoxia ([name]) /  Acute exacerbation of COPD with asthma ([place]) / possible acute diastolic CHF - Patient hypoxic on the admission with oxygen saturation 83% on room air - His oxygen saturation improved on BiPAP  - Continue Solu-Medrol 60 mg IV but reduce frequency to once a day instead of every 12 hours - Continue current bronchodilators - Continue to monitor stepdown unit while requiring BiPAP, this morning he was not on BiPAP but if he requires throughout the day he will remain in step down unit  - Has got Lasix IV in ED 40 mg one-time dose. We'll follow-up on 2-D echo results to determine if there is a component of congestive heart failure present as well.  Active Problems:   Right lower lobe pneumonia  - Chest x-ray on admission showed a right base infiltrate/edema. Because patient is from group home it is reasonable to cover him empirically for possible pneumonia even without the fever or leukocytosis please note he did have ongoing productive cough for past few days prior to this admission  - Pneumonia order set utilized  -  Started on empiric Levaquin - Respiratory culture pending - Blood cultures pending - Respiratory virus panel normal -Strep pneumonia negative and Legionella is pending - HIV nonreactive     Morbid obesity ([place]) - Due to excess calories  - Nutrition consulted      Dyslipidemia associated with type 2 diabetes mellitus ([place]) - Continue statin therapy     Diabetes mellitus due to underlying condition without complication, without long-term current use of insulin ([place]) - Started sliding scale insulin while patient hypoxic as metformin could precipitate lactic acidosis so metformin placed on hold    Essential hypertension - Continue atenolol     Hypothyroidism - Continue Synthroid     Paranoid schizophrenia, chronic condition ([place]) - Some change in medications from the admission medical reconciliation but appreciate pharmacy help in clarifying medication patient is currently taking which includes clozapine, Depakote, lithium, modafinil    DVT prophylaxis: SCDs for DVT prophylaxis Code Status: full code  Family Communication: Spoke with grandmother yesterday the time of the admission but no family member available this morning Disposition Plan: Remains in step down unit is intermittently requires BiPAP   Consultants:   None   Procedures:   BiPAP 11/25/2015 -->   Antimicrobials:    Levaquin 11/25/2015 -->   Subjective: No overnight events.  Objective: Vitals:   11/26/15 0700 11/26/15 0800 11/26/15 0951 11/26/15 1000  BP:      Pulse: 85 85  88  Resp: 15     Temp:  98.3  F (36.8 C)    TempSrc:  Oral    SpO2: 96% 100% 98% 97%  Weight:      Height:        Intake/Output Summary (Last 24 hours) at 11/26/15 1115 Last data filed at 11/26/15 1000  Gross per 24 hour  Intake            514.5 ml  Output             2750 ml  Net          -2235.5 ml   Filed Weights   11/25/15 1031 11/26/15 0500  Weight: (!) 164.6 kg (362 lb 14 oz) (!) 163.2 kg (359 lb 12.7 oz)      Examination:  General exam: Appears calm and comfortable  Respiratory system: Clear to auscultation. Respiratory effort normal. Cardiovascular system: S1 & S2 heard, RRR. No JVD, murmurs, rubs, gallops or clicks.  Gastrointestinal system: Abdomen is distended, soft and nontender. No organomegaly or masses felt. Normal bowel sounds heard. Central nervous system: No focal neurological deficits. Extremities: Symmetric 5 x 5 power. +1-2 LE pedal edema Skin: No rashes, lesions or ulcers Psychiatry: Mood & affect appropriate.   Data Reviewed: I have personally reviewed following labs and imaging studies  CBC:  Recent Labs Lab 11/25/15 0646 11/25/15 1110 11/26/15 0401  WBC 6.2 8.0 8.1  NEUTROABS 4.2 6.9  --   HGB 11.4* 12.4* 11.8*  HCT 38.9* 42.6 39.9  MCV 89.4 89.9 88.9  PLT 227 142* [id number]   Basic Metabolic Panel:  Recent Labs Lab 11/25/15 0646 11/25/15 1110 11/26/15 0401  NA 139 141 139  K 4.1 4.5 4.6  CL 103 99* 101  CO2 30 35* 32  GLUCOSE 138* 96 128*  BUN 13 13 14   CREATININE 1.16 1.18 0.96  CALCIUM 8.6* 9.0 8.7*  MG  --  1.8  --   PHOS  --  5.0*  --    GFR: Estimated Creatinine Clearance: 169.1 mL/min (by C-G formula based on SCr of 0.96 mg/dL). Liver Function Tests:  Recent Labs Lab 11/25/15 1110 11/26/15 0401  AST 18 16  ALT 18 16*  ALKPHOS 39 33*  BILITOT 0.6 0.5  PROT 8.0 7.2  ALBUMIN 4.2 3.8   No results for input(s): LIPASE, AMYLASE in the last 168 hours. No results for input(s): AMMONIA in the last 168 hours. Coagulation Profile:  Recent Labs Lab 11/25/15 1110  INR 1.14   Cardiac Enzymes:  Recent Labs Lab 11/25/15 1110 11/25/15 1640 11/25/15 2221  TROPONINI 0.03* <0.03 <0.03   BNP (last 3 results) No results for input(s): PROBNP in the last 8760 hours. HbA1C: No results for input(s): HGBA1C in the last 72 hours. CBG:  Recent Labs Lab 11/25/15 0640 11/25/15 1653 11/25/15 2213 11/26/15 0835  GLUCAP 139* 201* 141* 152*    Lipid Profile: No results for input(s): CHOL, HDL, LDLCALC, TRIG, CHOLHDL, LDLDIRECT in the last 72 hours. Thyroid Function Tests:  Recent Labs  11/25/15 1110  TSH 1.684   Anemia Panel: No results for input(s): VITAMINB12, FOLATE, FERRITIN, TIBC, IRON, RETICCTPCT in the last 72 hours. Urine analysis:    Component Value Date/Time   COLORURINE Yellow 04/09/2014 1215   COLORURINE YELLOW 10/23/2011 1845   APPEARANCEUR Clear 04/09/2014 1215   LABSPEC 1.015 04/09/2014 1215   PHURINE 6.0 04/09/2014 1215   PHURINE 6.0 10/23/2011 1845   GLUCOSEU Negative 04/09/2014 1215   HGBUR Negative 04/09/2014 1215   HGBUR NEGATIVE 10/23/2011 1845   BILIRUBINUR  Negative 04/09/2014 1215   KETONESUR Negative 04/09/2014 1215   [place] 10/23/2011 1845   PROTEINUR Negative 04/09/2014 1215   PROTEINUR NEGATIVE 10/23/2011 1845   UROBILINOGEN 1.0 10/23/2011 1845   NITRITE Negative 04/09/2014 1215   NITRITE NEGATIVE 10/23/2011 1845   LEUKOCYTESUR Negative 04/09/2014 1215   Sepsis Labs: @LABRCNTIP (procalcitonin:4,lacticidven:4)   Recent Results (from the past 240 hour(s))  MRSA PCR Screening     Status: None   Collection Time: 11/25/15 10:49 AM  Result Value Ref Range Status   MRSA by PCR NEGATIVE NEGATIVE Final    Comment:        The GeneXpert MRSA Assay (FDA approved for NASAL specimens only), is one component of a comprehensive MRSA colonization surveillance program. It is not intended to diagnose MRSA infection nor to guide or monitor treatment for MRSA infections.   Respiratory Panel by PCR     Status: None   Collection Time: 11/25/15  3:01 PM  Result Value Ref Range Status   Adenovirus NOT DETECTED NOT DETECTED Final   Coronavirus 229E NOT DETECTED NOT DETECTED Final   Coronavirus HKU1 NOT DETECTED NOT DETECTED Final   Coronavirus NL63 NOT DETECTED NOT DETECTED Final   Coronavirus OC43 NOT DETECTED NOT DETECTED Final   Metapneumovirus NOT DETECTED NOT DETECTED Final    Rhinovirus / Enterovirus NOT DETECTED NOT DETECTED Final   Influenza A NOT DETECTED NOT DETECTED Final   Influenza B NOT DETECTED NOT DETECTED Final   Parainfluenza Virus 1 NOT DETECTED NOT DETECTED Final   Parainfluenza Virus 2 NOT DETECTED NOT DETECTED Final   Parainfluenza Virus 3 NOT DETECTED NOT DETECTED Final   Parainfluenza Virus 4 NOT DETECTED NOT DETECTED Final   Respiratory Syncytial Virus NOT DETECTED NOT DETECTED Final   Bordetella pertussis NOT DETECTED NOT DETECTED Final   Chlamydophila pneumoniae NOT DETECTED NOT DETECTED Final   Mycoplasma pneumoniae NOT DETECTED NOT DETECTED Final    Comment: Performed at [facility]  Culture, sputum-assessment     Status: None   Collection Time: 11/25/15  5:37 PM  Result Value Ref Range Status   Specimen Description EXPECTORATED SPUTUM  Final   Special Requests NONE  Final   Sputum evaluation   Final    THIS SPECIMEN IS ACCEPTABLE. RESPIRATORY CULTURE REPORT TO FOLLOW.   Report Status 11/25/2015 FINAL  Final  Culture, respiratory (NON-Expectorated)     Status: None (Preliminary result)   Collection Time: 11/25/15  5:37 PM  Result Value Ref Range Status   Specimen Description SPUTUM  Final   Special Requests NONE  Final   Gram Stain   Final    NO WBC SEEN RARE SQUAMOUS EPITHELIAL CELLS PRESENT FEW GRAM POSITIVE COCCI IN PAIRS AND CHAINS IN CLUSTERS RARE GRAM POSITIVE RODS RARE GRAM NEGATIVE RODS    Culture   Final    TOO YOUNG TO READ Performed at [facility]    Report Status PENDING  Incomplete      Radiology Studies: Dg Chest Port 1 View Result Date: 11/25/2015  1.  Cardiomegaly. 2.  Low lung volumes.  Mild right base infiltrate/edema . Electronically Signed   By: [place]   On: 11/25/2015 07:12      Scheduled Meds: . atenolol  100 mg Oral Daily  . chlorhexidine  15 mL Mouth Rinse BID  . clozapine  450 mg Oral QHS  . divalproex  1,000 mg Oral QHS  . insulin aspart  0-5 Units  Subcutaneous QHS  . insulin  aspart  0-9 Units Subcutaneous TID WC  . ipratropium-albuterol  3 mL Nebulization TID  . levofloxacin (LEVAQUIN) IV  750 mg Intravenous Q24H  . levothyroxine  75 mcg Oral QAC breakfast  . lithium carbonate  450 mg Oral QHS  . loratadine  10 mg Oral Daily  . mouth rinse  15 mL Mouth Rinse q12n4p  . meloxicam  7.5 mg Oral Daily  . methylPREDNISolone (SOLU-MEDROL) injection  60 mg Intravenous Q12H  . modafinil  200 mg Oral Daily  . multivitamin with minerals  1 tablet Oral Daily  . oxybutynin  5 mg Oral Daily  . pantoprazole  40 mg Oral Daily  . simvastatin  20 mg Oral q1800  . sodium chloride flush  3 mL Intravenous Q12H   Continuous Infusions: . sodium chloride 10 mL/hr at 11/26/15 1000     LOS: 1 day    Time spent: 25 minutes  Greater than 50% of the time spent on counseling and coordinating the care.   Leisa Lenz, MD [facility] Pager [phone]  If 7PM-7AM, please contact night-coverage [email or web address] Password [email or web address] 11/26/2015, 11:15 AM

## 2015-11-26 NOTE — Evaluation (Signed)
Physical Therapy Evaluation Patient Details Name: John [name]. MRN: [id number] DOB: 1975/07/05 Today's Date: 11/26/2015   History of Present Illness  Pt admitted with acute resp failure and hx of DM and paranoid schizoprenia  Clinical Impression  Pt admitted as above and presenting with functional mobility limitations 2* obesity, premorbid deconditioning and mild ambulatory balance deficits.  Pt should progress to dc back to prior living arrangement.    Follow Up Recommendations No PT follow up    Equipment Recommendations  None recommended by PT    Recommendations for Other Services       Precautions / Restrictions Precautions Precautions: Fall Restrictions Weight Bearing Restrictions: No      Mobility  Bed Mobility Overal bed mobility: Modified Independent             General bed mobility comments: Pt to EOB unassisted  Transfers Overall transfer level: Needs assistance Equipment used: None Transfers: Sit to/from Stand Sit to Stand: Min guard         General transfer comment: mild instability with initial standing  Ambulation/Gait Ambulation/Gait assistance: Min assist;Min guard Ambulation Distance (Feet): 225 Feet Assistive device: None Gait Pattern/deviations: Step-through pattern;Decreased step length - right;Decreased step length - left;Shuffle;Wide base of support Gait velocity: decr   General Gait Details: Multiple short rest breaks for SOB, Increased time and noted mild general instability with rocking gait, wide BOS and posterior lean to compensate for body habitus (obesity)  Stairs            Wheelchair Mobility    Modified Rankin (Stroke Patients Only)       Balance                                             Pertinent Vitals/Pain Pain Assessment: No/denies pain    Home Living Family/patient expects to be discharged to:: Group home                      Prior Function Level of Independence:  Independent               Hand Dominance        Extremity/Trunk Assessment   Upper Extremity Assessment: Overall WFL for tasks assessed           Lower Extremity Assessment: Overall WFL for tasks assessed         Communication   Communication: No difficulties  Cognition Arousal/Alertness: Awake/alert Behavior During Therapy: WFL for tasks assessed/performed;Impulsive Overall Cognitive Status: Within Functional Limits for tasks assessed                      General Comments      Exercises     Assessment/Plan    PT Assessment Patient needs continued PT services  PT Problem List Decreased activity tolerance;Decreased balance;Obesity          PT Treatment Interventions DME instruction;Gait training;Stair training;Functional mobility training;Therapeutic activities;Therapeutic exercise;Balance training;Patient/family education    PT Goals (Current goals can be found in the Care Plan section)  Acute Rehab PT Goals Patient Stated Goal: Get better PT Goal Formulation: With patient Time For Goal Achievement: 12/03/15 Potential to Achieve Goals: Good    Frequency Min 3X/week   Barriers to discharge        Co-evaluation  End of Session Equipment Utilized During Treatment: Gait belt;Oxygen Activity Tolerance: Patient tolerated treatment well Patient left: in chair;with call bell/phone within reach;with chair alarm set Nurse Communication: Mobility status         Time: 1132-1209 PT Time Calculation (min) (ACUTE ONLY): 37 min   Charges:   PT Evaluation $PT Eval Moderate Complexity: 1 Procedure PT Treatments $Gait Training: 8-22 mins   PT G Codes:        Pessy Delamar 12-22-15, 6:18 PM

## 2015-11-26 NOTE — Progress Notes (Signed)
Name: John [name]. MRN: [id number] DOB: 04-16-1975    ADMISSION DATE:  11/25/2015 CONSULTATION DATE:  11/25/2015  REFERRING MD :  Leisa Lenz, M.D. / [facility]  CHIEF COMPLAINT:  Acute Respiratory Failure   BRIEF PATIENT DESCRIPTION: 40 y.o. male with a known history of schizophrenia and asthma. Admitted with acute hypoxic respiratory failure and acute hypercarbic respiratory failure with possible healthcare associated pneumonia. Initially on BiPAP.  SIGNIFICANT EVENTS  11/24 - Admit w/ acute hypoxic respiratory failure  STUDIES:  Port CXR 11/24:  Personally reviewed by me. Her dynamic and slightly rotated left view. Questionable fluid wave left hemidiaphragm. Question will cardiomegaly. No focal opacity appreciated but there is some fullness of the right hilum.  MICROBIOLOGY: MRSA PCR 11/24:  Negative Blood Ctx x2 11/24 >> Sputum Ctx 11/24 >> HIV 11/24:  Nonreactive Respiratory Panel PCR 11/24:  Negative   ANTIBIOTICS: Levaquin 11/24 >>  LINES/TUBES: PIV  SUBJECTIVE:  Patient denies any chest pain or pressure. He reports dyspnea improving. He did ambulate around the unit today without syncope or near syncope. Continued intermittent cough.  REVIEW OF SYSTEMS: No abdominal pain, nausea, or emesis. No fever or chills. No headache or vision changes.  VITAL SIGNS: Temp:  [97.5 F (36.4 C)-98.7 F (37.1 C)] 98.3 F (36.8 C) (11/25 0800) Pulse Rate:  [84-102] 84 (11/25 1100) Resp:  [11-22] 15 (11/25 0700) BP: (101-193)/(66-128) 136/93 (11/25 0500) SpO2:  [89 %-100 %] 95 % (11/25 1100) FiO2 (%):  [30 %] 30 % (11/24 1250) Weight:  [359 lb 12.7 oz (163.2 kg)] 359 lb 12.7 oz (163.2 kg) (11/25 0500)  PHYSICAL EXAMINATION: General:  Obese. Sitting up in chair. Awake.  Integument:  Warm & dry. No rash on exposed skin.  Lymphatics:  No appreciated cervical or supraclavicular lymphadenoapthy. HEENT:  No scleral injection or icterus. Moist mucous membranes. Cardiovascular:   Regular rate. Continued lower extremity edema. Unable to appreciate JVD given body positioning. Pulmonary: Normal work of breathing on nasal cannula. Slightly diminished breath sounds in the bases. Speaking in complete sentences. Abdomen: Soft. Normal bowel sounds. Protuberant. Nontender. Neurological:  No meningismus. Moving all 4 extremities equally. Following commands.   Recent Labs Lab 11/25/15 0646 11/25/15 1110 11/26/15 0401  NA 139 141 139  K 4.1 4.5 4.6  CL 103 99* 101  CO2 30 35* 32  BUN 13 13 14   CREATININE 1.16 1.18 0.96  GLUCOSE 138* 96 128*    Recent Labs Lab 11/25/15 0646 11/25/15 1110 11/26/15 0401  HGB 11.4* 12.4* 11.8*  HCT 38.9* 42.6 39.9  WBC 6.2 8.0 8.1  PLT 227 142* 256   Dg Chest Port 1 View  Result Date: 11/25/2015 CLINICAL DATA:  Shortness of breath. EXAM: PORTABLE CHEST 1 VIEW COMPARISON:  05/17/2014. FINDINGS: Mediastinum normal. Cardiomegaly. Low lung volumes. Mild right base infiltrate/edema. No pleural effusion or pneumothorax . IMPRESSION: 1.  Cardiomegaly. 2.  Low lung volumes.  Mild right base infiltrate/edema . Electronically Signed   By: Marcello Moores  Register   On: 11/25/2015 07:12    ASSESSMENT / PLAN:  40 y.o. with history of recent cigarette use and known underlying asthma. Presents with acute hypercarbic respiratory failure & acute hypoxic respiratory failure requiring noninvasive positive pressure ventilation. Patient has successfully weaned off of BiPAP support during the day. Respiratory status is steadily improving.  1. Acute Hypercarbic Respiratory Failure: Patient will need outpatient full pulmonary function testing after discharge. Agree with transitioning to Solu-Medrol IV every 24 hours. Patient should have a total  6 day course of steroids completed with prednisone 60 mg by mouth daily without taper. Agree with continuing Duonebs 3 times a day as long as patient tolerates. 2. Acute Hypoxic Respiratory Failure:  Recommend continuing to  wean FiO2 for saturation 88-94%. Defer diuresis to primary service. 3. Asthma/COPD Exacerbation: Continuing patient on Solu-Medrol and Duonebs as noted above. 4. Acute Bronchitis/Pneumonia: Infectious workup initiated by hospitalist. Recommend a 7 day course of empiric Levaquin unless cultures dictate otherwise. 5. OSA:  Not currently on NIPPV at home. Continuing on BiPAP daily at bedtime. Need to ensure patient has home BiPAP/CPAP set up for his use.   Remainder of care as per primary service. PCCM will sign off now but please let me know if we can be of any further assistance in Mr. Avram' care.   Sonia Baller Ashok Cordia, M.D. [facility] Pager:  [phone] After 3pm or if no response, call [phone] 11/26/2015, 12:11 PM

## 2015-11-27 DIAGNOSIS — J9602 Acute respiratory failure with hypercapnia: Secondary | ICD-10-CM

## 2015-11-27 LAB — BASIC METABOLIC PANEL
Anion gap: 6 (ref 5–15)
BUN: 18 mg/dL (ref 6–20)
CALCIUM: 9 mg/dL (ref 8.9–10.3)
CO2: 32 mmol/L (ref 22–32)
CREATININE: 0.89 mg/dL (ref 0.61–1.24)
Chloride: 101 mmol/L (ref 101–111)
GFR calc non Af Amer: >60 mL/min (ref >60)
Glucose, Bld: 127 mg/dL — ABNORMAL HIGH (ref 65–99)
Potassium: 4.4 mmol/L (ref 3.5–5.1)
SODIUM: 139 mmol/L (ref 135–145)

## 2015-11-27 LAB — CBC
HCT: 40.2 % (ref 39.0–52.0)
Hemoglobin: 11.9 g/dL — ABNORMAL LOW (ref 13.0–17.0)
MCH: 25.8 pg — ABNORMAL LOW (ref 26.0–34.0)
MCHC: 29.6 g/dL — AB (ref 30.0–36.0)
MCV: 87.2 fL (ref 78.0–100.0)
PLATELETS: 232 10*3/uL (ref 150–400)
RBC: 4.61 MIL/uL (ref 4.22–5.81)
RDW: 15.6 % — AB (ref 11.5–15.5)
WBC: 9.9 10*3/uL (ref 4.0–10.5)

## 2015-11-27 LAB — GLUCOSE, CAPILLARY
GLUCOSE-CAPILLARY: 109 mg/dL — AB (ref 65–99)
GLUCOSE-CAPILLARY: 207 mg/dL — AB (ref 65–99)
Glucose-Capillary: 117 mg/dL — ABNORMAL HIGH (ref 65–99)
Glucose-Capillary: 170 mg/dL — ABNORMAL HIGH (ref 65–99)

## 2015-11-27 LAB — PROCALCITONIN

## 2015-11-27 MED ORDER — LEVOFLOXACIN 750 MG PO TABS
750.0000 mg | ORAL_TABLET | Freq: Every day | ORAL | Status: DC
  Administered 2015-11-28: 750 mg via ORAL
  Filled 2015-11-27: qty 1

## 2015-11-27 MED ORDER — IPRATROPIUM-ALBUTEROL 0.5-2.5 (3) MG/3ML IN SOLN
3.0000 mL | Freq: Two times a day (BID) | RESPIRATORY_TRACT | Status: DC
  Administered 2015-11-27 – 2015-11-28 (×2): 3 mL via RESPIRATORY_TRACT
  Filled 2015-11-27 (×2): qty 3

## 2015-11-27 MED ORDER — PREDNISONE 50 MG PO TABS
60.0000 mg | ORAL_TABLET | Freq: Every day | ORAL | Status: DC
  Administered 2015-11-28: 60 mg via ORAL
  Filled 2015-11-27: qty 1

## 2015-11-27 NOTE — Progress Notes (Signed)
Patient ID: John Colee., male   DOB: 13-Dec-1975, 40 y.o.   MRN: [id number]  PROGRESS NOTE    John [name].  [id number] DOB: [birth year] DOA: 11/25/2015  PCP: John [place], MD   Brief Narrative:  40 y.o. male with medical history significant for schizophrenia for which reason he is in group home, morbid obesity, diabetes, asthma, sleep apnea. Patient presented to [facility] with worsening shortness of breath, intermittently productive cough of whitish sputum, dizziness and weakness started few days prior to this admission.  In ED blood pressure was stable but oxygen saturation was 83% on room air. He was placed on BiPAP and admitted to stepdown unit. CXR showed cardiomegaly, right base infiltrate / edema. BNP was 190 range. ABG showed pH of 7.31, CO2 67, pO2 104.    Assessment & Plan:    Principal Problem:   Acute respiratory failure with hypoxia ([name]) /  Acute exacerbation of COPD with asthma ([place])  - Oxygen saturation improving, off of BiPAP - Currently on Solu-Medrol IV every 24 hours, will give 1 dose today and will start prednisone 60 mg daily for total of 6 days without taper per pulmonary recommendations -Continue current nebulizer treatments - Stable respiratory status  Active Problems:   Acute systolic and diastolic CHF  - 2-D echo on this admission with ejection fraction of 45%  - BNP 199 on the admission  - Will get cardiology consult for input on management  - Patient's respiratory status is stable at this time  - He did receive Lasix 40 mg IV in ED but no further Lasix given since then as patient appeared volume stable, weight 164.6 kg --> 163.2 kg - Appreciate cardiology input on whether we should start Lasix     Right lower lobe pneumonia  - Chest x-ray on admission showed a right base infiltrate/edema. Because patient is from group home it is reasonable to cover him empirically for possible pneumonia even without the fever or leukocytosis please  note he did have ongoing productive cough for past few days prior to this admission  - Started on empiric Levaquin - Respiratory culture pending - Blood cultures negative so far - Respiratory virus panel normal - Strep pneumonia negative and Legionella isnegative - HIV nonreactive     Morbid obesity ([place]) - Due to excess calories  - Nutrition consulted      Dyslipidemia associated with type 2 diabetes mellitus ([place]) - Continue statin therapy     Diabetes mellitus due to underlying condition without complication, without long-term current use of insulin ([place]) - Started sliding scale insulin while patient hypoxic as metformin could precipitate lactic acidosis so metformin placed on hold    Essential hypertension - Continue atenolol     Hypothyroidism - Continue Synthroid     Paranoid schizophrenia, chronic condition ([place]) - Some change in medications from the admission medical reconciliation but appreciate pharmacy help in clarifying medication patient is currently taking which includes clozapine, Depakote, lithium, modafinil    DVT prophylaxis: SCDs for DVT prophylaxis Code Status: full code  Family Communication: family not at the bedside this am Disposition Plan: Remains in step down unit is intermittently requires BiPAP   Consultants:   Cardiology 11/27/2015    Procedures:   BiPAP 11/25/2015 -->   Antimicrobials:    Levaquin 11/25/2015 -->   Subjective: No overnight events.  Objective: Vitals:   11/26/15 1400 11/26/15 1413 11/26/15 1418 11/27/15 0820  BP: (!) 159/108 (!) 151/103 133/85   Pulse:  91     Resp: 19     Temp: 98.3 F (36.8 C)     TempSrc: Oral     SpO2: 98%   99%  Weight:      Height:        Intake/Output Summary (Last 24 hours) at 11/27/15 1014 Last data filed at 11/27/15 0824  Gross per 24 hour  Intake              800 ml  Output              500 ml  Net              300 ml   Filed Weights   11/25/15 1031 11/26/15 0500    Weight: (!) 164.6 kg (362 lb 14 oz) (!) 163.2 kg (359 lb 12.7 oz)    Examination:  General exam: Appears calm and comfortable, no distress  Respiratory system: no wheezing, no rhonchi  Cardiovascular system: S1 & S2 heard, Rate controlled .  Gastrointestinal system: (+) BS, non tender abdomen, obese abdomen  Central nervous system: Nonfocal  Extremities:  +1-2 LE pedal edema Skin: No rashes, lesions or ulcers, skin warm and dry  Psychiatry: Mood & affect appropriate. Not agitated or restless   Data Reviewed: I have personally reviewed following labs and imaging studies  CBC:  Recent Labs Lab 11/25/15 0646 11/25/15 1110 11/26/15 0401 11/27/15 0622  WBC 6.2 8.0 8.1 9.9  NEUTROABS 4.2 6.9  --   --   HGB 11.4* 12.4* 11.8* 11.9*  HCT 38.9* 42.6 39.9 40.2  MCV 89.4 89.9 88.9 87.2  PLT 227 142* 256 [id number]   Basic Metabolic Panel:  Recent Labs Lab 11/25/15 0646 11/25/15 1110 11/26/15 0401 11/27/15 0622  NA 139 141 139 139  K 4.1 4.5 4.6 4.4  CL 103 99* 101 101  CO2 30 35* 32 32  GLUCOSE 138* 96 128* 127*  BUN 13 13 14 18   CREATININE 1.16 1.18 0.96 0.89  CALCIUM 8.6* 9.0 8.7* 9.0  MG  --  1.8  --   --   PHOS  --  5.0*  --   --    GFR: Estimated Creatinine Clearance: 182.4 mL/min (by C-G formula based on SCr of 0.89 mg/dL). Liver Function Tests:  Recent Labs Lab 11/25/15 1110 11/26/15 0401  AST 18 16  ALT 18 16*  ALKPHOS 39 33*  BILITOT 0.6 0.5  PROT 8.0 7.2  ALBUMIN 4.2 3.8   No results for input(s): LIPASE, AMYLASE in the last 168 hours. No results for input(s): AMMONIA in the last 168 hours. Coagulation Profile:  Recent Labs Lab 11/25/15 1110  INR 1.14   Cardiac Enzymes:  Recent Labs Lab 11/25/15 1110 11/25/15 1640 11/25/15 2221  TROPONINI 0.03* <0.03 <0.03   BNP (last 3 results) No results for input(s): PROBNP in the last 8760 hours. HbA1C: No results for input(s): HGBA1C in the last 72 hours. CBG:  Recent Labs Lab 11/26/15 0835  11/26/15 1251 11/26/15 1740 11/26/15 2253 11/27/15 0733  GLUCAP 152* 134* 138* 119* 109*   Lipid Profile: No results for input(s): CHOL, HDL, LDLCALC, TRIG, CHOLHDL, LDLDIRECT in the last 72 hours. Thyroid Function Tests:  Recent Labs  11/25/15 1110  TSH 1.684   Anemia Panel: No results for input(s): VITAMINB12, FOLATE, FERRITIN, TIBC, IRON, RETICCTPCT in the last 72 hours. Urine analysis:    Component Value Date/Time   COLORURINE Yellow 04/09/2014 1215   COLORURINE YELLOW 10/23/2011 1845   APPEARANCEUR  Clear 04/09/2014 1215   LABSPEC 1.015 04/09/2014 1215   PHURINE 6.0 04/09/2014 1215   PHURINE 6.0 10/23/2011 1845   GLUCOSEU Negative 04/09/2014 1215   HGBUR Negative 04/09/2014 1215   HGBUR NEGATIVE 10/23/2011 1845   BILIRUBINUR Negative 04/09/2014 1215   KETONESUR Negative 04/09/2014 1215   KETONESUR NEGATIVE 10/23/2011 1845   PROTEINUR Negative 04/09/2014 1215   PROTEINUR NEGATIVE 10/23/2011 1845   UROBILINOGEN 1.0 10/23/2011 1845   NITRITE Negative 04/09/2014 1215   NITRITE NEGATIVE 10/23/2011 1845   LEUKOCYTESUR Negative 04/09/2014 1215   Sepsis Labs: @LABRCNTIP (procalcitonin:4,lacticidven:4)   Recent Results (from the past 240 hour(s))  MRSA PCR Screening     Status: None   Collection Time: 11/25/15 10:49 AM  Result Value Ref Range Status   MRSA by PCR NEGATIVE NEGATIVE Final    Comment:        The GeneXpert MRSA Assay (FDA approved for NASAL specimens only), is one component of a comprehensive MRSA colonization surveillance program. It is not intended to diagnose MRSA infection nor to guide or monitor treatment for MRSA infections.   Culture, blood (routine x 2) Call MD if unable to obtain prior to antibiotics being given     Status: None (Preliminary result)   Collection Time: 11/25/15 11:00 AM  Result Value Ref Range Status   Specimen Description BLOOD RIGHT ARM  Final   Special Requests BOTTLES DRAWN AEROBIC AND ANAEROBIC [place]  Final   Culture    Final    NO GROWTH 1 DAY Performed at [facility]    Report Status PENDING  Incomplete  Culture, blood (routine x 2) Call MD if unable to obtain prior to antibiotics being given     Status: None (Preliminary result)   Collection Time: 11/25/15 11:10 AM  Result Value Ref Range Status   Specimen Description BLOOD RIGHT ANTECUBITAL  Final   Special Requests BOTTLES DRAWN AEROBIC AND ANAEROBIC 10CC  Final   Culture   Final    NO GROWTH 1 DAY Performed at [facility]    Report Status PENDING  Incomplete  Respiratory Panel by PCR     Status: None   Collection Time: 11/25/15  3:01 PM  Result Value Ref Range Status   Adenovirus NOT DETECTED NOT DETECTED Final   Coronavirus 229E NOT DETECTED NOT DETECTED Final   Coronavirus HKU1 NOT DETECTED NOT DETECTED Final   Coronavirus NL63 NOT DETECTED NOT DETECTED Final   Coronavirus OC43 NOT DETECTED NOT DETECTED Final   Metapneumovirus NOT DETECTED NOT DETECTED Final   Rhinovirus / Enterovirus NOT DETECTED NOT DETECTED Final   Influenza A NOT DETECTED NOT DETECTED Final   Influenza B NOT DETECTED NOT DETECTED Final   Parainfluenza Virus 1 NOT DETECTED NOT DETECTED Final   Parainfluenza Virus 2 NOT DETECTED NOT DETECTED Final   Parainfluenza Virus 3 NOT DETECTED NOT DETECTED Final   Parainfluenza Virus 4 NOT DETECTED NOT DETECTED Final   Respiratory Syncytial Virus NOT DETECTED NOT DETECTED Final   Bordetella pertussis NOT DETECTED NOT DETECTED Final   Chlamydophila pneumoniae NOT DETECTED NOT DETECTED Final   Mycoplasma pneumoniae NOT DETECTED NOT DETECTED Final    Comment: Performed at [facility]  Culture, sputum-assessment     Status: None   Collection Time: 11/25/15  5:37 PM  Result Value Ref Range Status   Specimen Description EXPECTORATED SPUTUM  Final   Special Requests NONE  Final   Sputum evaluation   Final    THIS SPECIMEN IS  ACCEPTABLE. RESPIRATORY CULTURE REPORT TO FOLLOW.   Report Status 11/25/2015  FINAL  Final  Culture, respiratory (NON-Expectorated)     Status: None (Preliminary result)   Collection Time: 11/25/15  5:37 PM  Result Value Ref Range Status   Specimen Description SPUTUM  Final   Special Requests NONE  Final   Gram Stain   Final    NO WBC SEEN RARE SQUAMOUS EPITHELIAL CELLS PRESENT FEW GRAM POSITIVE COCCI IN PAIRS AND CHAINS IN CLUSTERS RARE GRAM POSITIVE RODS RARE GRAM NEGATIVE RODS    Culture   Final    CULTURE REINCUBATED FOR BETTER GROWTH Performed at [facility]    Report Status PENDING  Incomplete      Radiology Studies: Dg Chest Port 1 View Result Date: 11/25/2015  1.  Cardiomegaly. 2.  Low lung volumes.  Mild right base infiltrate/edema . Electronically Signed   By: Lyon   On: 11/25/2015 07:12      Scheduled Meds: . atenolol  100 mg Oral Daily  . chlorhexidine  15 mL Mouth Rinse BID  . clozapine  450 mg Oral QHS  . divalproex  1,000 mg Oral QHS  . insulin aspart  0-5 Units Subcutaneous QHS  . insulin aspart  0-9 Units Subcutaneous TID WC  . ipratropium-albuterol  3 mL Nebulization TID  . levofloxacin (LEVAQUIN) IV  750 mg Intravenous Q24H  . levothyroxine  75 mcg Oral QAC breakfast  . lithium carbonate  450 mg Oral QHS  . loratadine  10 mg Oral Daily  . mouth rinse  15 mL Mouth Rinse q12n4p  . meloxicam  7.5 mg Oral Daily  . modafinil  200 mg Oral Daily  . multivitamin with minerals  1 tablet Oral Daily  . oxybutynin  5 mg Oral Daily  . pantoprazole  40 mg Oral Daily  . [START ON 11/28/2015] predniSONE  60 mg Oral Q breakfast  . simvastatin  20 mg Oral q1800  . sodium chloride flush  3 mL Intravenous Q12H   Continuous Infusions: . sodium chloride 10 mL/hr at 11/26/15 1300     LOS: 2 days    Time spent: 25 minutes  Greater than 50% of the time spent on counseling and coordinating the care.   Leisa Lenz, MD [facility] Pager [phone]  If 7PM-7AM, please contact  night-coverage [email or web address] Password [email or web address] 11/27/2015, 10:14 AM

## 2015-11-27 NOTE — Progress Notes (Signed)
Nutrition Brief Note  Patient identified to be seen per COPD Gold Protocol.  Wt Readings from Last 15 Encounters:  11/26/15 (!) 359 lb 12.7 oz (163.2 kg)  09/06/14 (!) 346 lb (156.9 kg)  09/29/08 (!) 333 lb 6.1 oz (151.2 kg)    Spoke with patient at bedside. He reports his appetite is great and he is eating well. He typically eats 3 meals per day and several snacks between meals. Patient reports his weight is stable at about 360 lbs (per chart he has been gaining since last year).   Per chart patient is from [facility] in [place].  Body mass index is 46.19 kg/m. Patient meets criteria for Obesity Class III (morbid obesity) based on current BMI.   Current diet order is CHO Modified, patient is consuming approximately 100% of meals at this time. Labs and medications reviewed.   No nutrition interventions warranted at this time. If nutrition issues arise, please consult RD.   Willey Blade, MS, RD, LDN Pager: [phone] After Hours Pager: [phone]

## 2015-11-27 NOTE — Progress Notes (Signed)
PHARMACIST - PHYSICIAN COMMUNICATION DR:   Charlies Silvers CONCERNING: Antibiotic IV to Oral Route Change Policy  RECOMMENDATION: This patient is receiving Levaquin by the intravenous route.  Based on criteria approved by the Pharmacy and Therapeutics Committee, the antibiotic(s) is/are being converted to the equivalent oral dose form(s).   DESCRIPTION: These criteria include:  Patient being treated for a respiratory tract infection, urinary tract infection, cellulitis or clostridium difficile associated diarrhea if on metronidazole  The patient is not neutropenic and does not exhibit a GI malabsorption state  The patient is eating (either orally or via tube) and/or has been taking other orally administered medications for a least 24 hours  The patient is improving clinically and has a Tmax < 100.5  If you have questions about this conversion, please contact the Pharmacy Department  []   [phone] )  [facility] []   [phone] )  [facility] []   [phone] )  [facility] []   [phone] )  [facility] [x]   [phone] )  [place], PharmD, [place] Pager: [phone] 11/27/2015@2 :06 PM

## 2015-11-27 NOTE — Evaluation (Signed)
   Occupational Therapy Evaluation Patient Details Name: John [name]. MRN: [id number] DOB: May 13, 1975 Today's Date: 28-Nov-2015    History of Present Illness Pt admitted with acute resp failure and hx of DM and paranoid schizoprenia   Clinical Impression   Pt admitted with ARF. Pt currently with functional limitations due to the deficits listed below (see OT Problem List).  Pt will benefit from skilled OT to increase their safety and independence with ADL and functional mobility for ADL to facilitate discharge to venue listed below.      Follow Up Recommendations  Home health OT    Equipment Recommendations  None recommended by OT       Precautions / Restrictions Precautions Precautions: Fall Restrictions Weight Bearing Restrictions: No      Mobility Bed Mobility Overal bed mobility: Modified Independent             General bed mobility comments: Pt to EOB unassisted  Transfers Overall transfer level: Needs assistance Equipment used: None Transfers: Sit to/from Stand;Stand Pivot Transfers Sit to Stand: Min guard         General transfer comment: mild instability with initial standing         ADL Overall ADL's : Needs assistance/impaired Eating/Feeding: Set up;Sitting   Grooming: Set up;Sitting   Upper Body Bathing: Minimal assitance;Cueing for sequencing;Cueing for safety   Lower Body Bathing: Minimal assistance;Sit to/from stand;Cueing for sequencing;Cueing for safety;Cueing for compensatory techniques   Upper Body Dressing : Set up;Sitting   Lower Body Dressing: Sit to/from stand;Cueing for safety;Cueing for sequencing;Moderate assistance   Toilet Transfer: Minimal assistance;Ambulation;Comfort height toilet   Toileting- Clothing Manipulation and Hygiene: Minimal assistance;Sit to/from stand;Cueing for sequencing         General ADL Comments: needed constant VC to stay on task               Pertinent Vitals/Pain Pain Assessment:  No/denies pain           Communication     Cognition Arousal/Alertness: Awake/alert Behavior During Therapy: WFL for tasks assessed/performed;Impulsive Overall Cognitive Status: Within Functional Limits for tasks assessed                               OT Problem List: Decreased strength;Decreased activity tolerance;Obesity   OT Treatment/Interventions: Self-care/ADL training;DME and/or AE instruction;Patient/family education    OT Goals(Current goals can be found in the care plan section) Acute Rehab OT Goals Patient Stated Goal: Get better OT Goal Formulation: With patient Time For Goal Achievement: 12/04/15 Potential to Achieve Goals: Good  OT Frequency: Min 2X/week   Barriers to D/C:               End of Session Nurse Communication: Mobility status  Activity Tolerance: Patient tolerated treatment well Patient left: in chair;with call bell/phone within reach;with nursing/sitter in room   Time: 1220-1240 OT Time Calculation (min): 20 min Charges:  OT General Charges $OT Visit: 1 Procedure OT Evaluation $OT Eval Moderate Complexity: 1 Procedure G-Codes:    Payton Mccallum D 11/28/2015, 1:18 PM

## 2015-11-28 ENCOUNTER — Encounter (HOSPITAL_COMMUNITY): Payer: Self-pay | Admitting: Physician Assistant

## 2015-11-28 DIAGNOSIS — I420 Dilated cardiomyopathy: Secondary | ICD-10-CM

## 2015-11-28 DIAGNOSIS — Z720 Tobacco use: Secondary | ICD-10-CM

## 2015-11-28 DIAGNOSIS — I429 Cardiomyopathy, unspecified: Secondary | ICD-10-CM

## 2015-11-28 LAB — CBC
HCT: 41 % (ref 39.0–52.0)
Hemoglobin: 12.2 g/dL — ABNORMAL LOW (ref 13.0–17.0)
MCH: 26 pg (ref 26.0–34.0)
MCHC: 29.8 g/dL — AB (ref 30.0–36.0)
MCV: 87.2 fL (ref 78.0–100.0)
PLATELETS: 232 10*3/uL (ref 150–400)
RBC: 4.7 MIL/uL (ref 4.22–5.81)
RDW: 15.2 % (ref 11.5–15.5)
WBC: 10.7 10*3/uL — AB (ref 4.0–10.5)

## 2015-11-28 LAB — GLUCOSE, CAPILLARY
Glucose-Capillary: 99 mg/dL (ref 65–99)
Glucose-Capillary: 231 mg/dL — ABNORMAL HIGH (ref 65–99)

## 2015-11-28 LAB — CULTURE, RESPIRATORY
CULTURE: NORMAL
GRAM STAIN: NONE SEEN

## 2015-11-28 MED ORDER — CLOZAPINE 200 MG PO TABS: 200.0000 mg | ORAL_TABLET | Freq: Every day | ORAL | 0 refills | Status: DC

## 2015-11-28 MED ORDER — PREDNISONE 20 MG PO TABS: 60.0000 mg | ORAL_TABLET | Freq: Every day | ORAL | 0 refills | Status: DC

## 2015-11-28 MED ORDER — LOSARTAN POTASSIUM 50 MG PO TABS: 50.0000 mg | ORAL_TABLET | Freq: Every day | ORAL | 0 refills | Status: DC

## 2015-11-28 MED ORDER — LEVOFLOXACIN 750 MG PO TABS: 750.0000 mg | ORAL_TABLET | Freq: Every day | ORAL | 0 refills | Status: DC

## 2015-11-28 MED ORDER — LOSARTAN POTASSIUM 50 MG PO TABS
50.0000 mg | ORAL_TABLET | Freq: Every day | ORAL | Status: DC
  Administered 2015-11-28: 50 mg via ORAL
  Filled 2015-11-28: qty 1

## 2015-11-28 NOTE — Progress Notes (Signed)
Discharge instructions given to pt's caregiver, verbalized understanding. Left the unit in stable condition.

## 2015-11-28 NOTE — Discharge Summary (Signed)
Physician Discharge Summary  John [name]. [id number] DOB: 1975/01/21 DOA: 11/25/2015  PCP: Cletis Athens, MD  Admit date: 11/25/2015 Discharge date: 11/28/2015  Recommendations for Outpatient Follow-up:  1. Levaquin for 3 days and discharge  Discharge Diagnoses:  Principal Problem:   Acute respiratory failure with hypoxia ([place]) Active Problems:   Acute exacerbation of COPD with asthma ([place])   Morbid obesity ([place])   Dyslipidemia associated with type 2 diabetes mellitus ([place])   Diabetes mellitus due to underlying condition without complication, without long-term current use of insulin ([place])   Essential hypertension   Hypothyroidism   Paranoid schizophrenia, chronic condition ([place])   Acute respiratory failure with hypercapnia ([name])   OSA (obstructive sleep apnea)   Cardiomyopathy ([place])   Tobacco abuse    Discharge Condition: stable   Diet recommendation: as tolerated   History of present illness:  40 y.o.malewith medical history significant for schizophrenia for which reason he is in group home, morbid obesity, diabetes, asthma, sleep apnea. Patient presented to [facility] with worsening shortness of breath, intermittently productive cough of whitish sputum, dizziness and weakness started few days prior to this admission.  In ED blood pressure was stable but oxygen saturation was 83% on room air. He was placed on BiPAP and admitted to stepdown unit. CXR showed cardiomegaly, right base infiltrate / edema. BNP was 190 range. ABG showed pH of 7.31, CO2 67, pO2 104.   Hospital Course:   Assessment & Plan:    Principal Problem: Acute respiratory failure with hypoxia ([name]) /Acute exacerbation of COPD with asthma ([place])  - Stable respiratory status - He will continue 5 more days of prednisone on discharge - Stable respiratory status  Active Problems:   Acute systolic and diastolic CHF  - 2-D echo on this admission with ejection fraction of 45%  -  BNP 199 on the admission  - He did receive Lasix 40 mg IV in ED but no further Lasix given since then as patient appeared volume stable, weight 164.6 kg --> 163.2 kg - Appreciate cardiology input, added losartan, no further work up required at this time  - No need for lasix as pt not volume overloaded   Right lower lobe pneumonia  - Chest x-ray on admission showed a right base infiltrate/edema. Because patient is from group home it is reasonable to cover him empirically for possible pneumonia even without the fever or leukocytosis please note he did have ongoing productive cough for past few days prior to this admission  - Started on empiric Levaquin, will continue for 3 more days on discharge  - Respiratory culture normal floora - Blood cultures negative so far - Respiratory virus panel normal - Strep pneumonia negative and Legionella isnegative - HIV nonreactive   Morbid obesity ([place]) - Due to excess calories  - Nutrition consulted   Dyslipidemia associated with type 2 diabetes mellitus ([place]) - Continue statin therapy   Diabetes mellitus due to underlying condition without complication, without long-term current use of insulin ([place]) - Continue home meds  Essential hypertension - Continue atenolol and losartan  Hypothyroidism - Continue Synthroid   Paranoid schizophrenia, chronic condition ([place]) - Some change in medications from the admission medical reconciliation but appreciate pharmacy help in clarifying medication patient is currently taking which includes clozapine, Depakote, lithium, modafinil    DVT prophylaxis: SCDs for DVT prophylaxis Code Status: full code  Family Communication: family not at the bedside this am Disposition Plan: Remains in step down unit is intermittently requires BiPAP  Consultants:   Cardiology 11/27/2015    Procedures:   BiPAP 11/25/2015 -->   Antimicrobials:    Levaquin 11/25/2015  -->   Signed:  Leisa Lenz, MD  [facility] 11/28/2015, 12:30 PM  Pager #: [phone]  Time spent in minutes: less than 30 minutes    Discharge Exam: Vitals:   11/28/15 0053 11/28/15 0552  BP: (!) 138/95 (!) 151/83  Pulse:  81  Resp:    Temp:  97.8 F (36.6 C)   Vitals:   11/27/15 2100 11/28/15 0053 11/28/15 0552 11/28/15 0950  BP: (!) 160/96 (!) 138/95 (!) 151/83   Pulse: 76  81   Resp: 18     Temp: 98.3 F (36.8 C)  97.8 F (36.6 C)   TempSrc: Oral  Oral   SpO2: 99%  97% 98%  Weight:      Height:        General: Pt is alert, not in acute distress Cardiovascular: Regular rate and rhythm, S1/S2 + Respiratory: Clear to auscultation bilaterally, no wheezing, no crackles, no rhonchi Abdominal: Soft, non tender, non distended, bowel sounds +, no guarding Extremities: +1 LE edema, no cyanosis, pulses palpable bilaterally DP and PT Neuro: Grossly nonfocal  Discharge Instructions  Discharge Instructions    Call MD for:  difficulty breathing, headache or visual disturbances    Complete by:  As directed    Call MD for:  redness, tenderness, or signs of infection (pain, swelling, redness, odor or green/yellow discharge around incision site)    Complete by:  As directed    Call MD for:  severe uncontrolled pain    Complete by:  As directed    Diet - low sodium heart healthy    Complete by:  As directed    Discharge instructions    Complete by:  As directed    Continue Levaquin for 3 more days on discharge.   Increase activity slowly    Complete by:  As directed        Medication List    TAKE these medications   Armodafinil 250 MG tablet Take 250 mg by mouth daily.   atenolol 100 MG tablet Commonly known as:  TENORMIN Take 100 mg by mouth daily.   clonazePAM 0.5 MG tablet Commonly known as:  KLONOPIN Take 0.5 mg by mouth 3 (three) times daily.   cloZAPine 100 MG tablet Commonly known as:  CLOZARIL Take 450 mg by mouth at bedtime.    clozapine 200 MG tablet Commonly known as:  CLOZARIL Take 1 tablet (200 mg total) by mouth daily.   divalproex 500 MG 24 hr tablet Commonly known as:  DEPAKOTE ER Take 2,000 mg by mouth at bedtime.   glipiZIDE 5 MG 24 hr tablet Commonly known as:  GLUCOTROL XL Take 5 mg by mouth 2 (two) times daily.   levofloxacin 750 MG tablet Commonly known as:  LEVAQUIN Take 1 tablet (750 mg total) by mouth daily.   levothyroxine 75 MCG tablet Commonly known as:  SYNTHROID, LEVOTHROID Take 75 mcg by mouth daily.   lithium carbonate 450 MG CR tablet Commonly known as:  ESKALITH Take 450 mg by mouth at bedtime.   loratadine 10 MG tablet Commonly known as:  CLARITIN Take 10 mg by mouth daily.   losartan 50 MG tablet Commonly known as:  COZAAR Take 1 tablet (50 mg total) by mouth daily.   meloxicam 7.5 MG tablet Commonly known as:  MOBIC Take 7.5 mg by mouth daily.   metFORMIN  1000 MG tablet Commonly known as:  GLUCOPHAGE Take 1,000 mg by mouth 2 (two) times daily with a meal.   multivitamins ther. w/minerals Tabs tablet Take 1 tablet by mouth daily.   omeprazole 20 MG capsule Commonly known as:  PRILOSEC Take 20 mg by mouth daily.   oxybutynin 5 MG tablet Commonly known as:  DITROPAN Take 5 mg by mouth daily.   predniSONE 20 MG tablet Commonly known as:  DELTASONE Take 3 tablets (60 mg total) by mouth daily with breakfast. Start taking on:  11/29/2015   simvastatin 20 MG tablet Commonly known as:  ZOCOR Take 20 mg by mouth at bedtime.      Follow-up Information    MASOUD,JAVED, MD. Schedule an appointment as soon as possible for a visit in 1 week(s).   Specialty:  Internal Medicine Contact information: [place] [id number] [phone]            The results of significant diagnostics from this hospitalization (including imaging, microbiology, ancillary and laboratory) are listed below for reference.    Significant Diagnostic Studies: Dg  Chest Port 1 View  Result Date: 11/25/2015 CLINICAL DATA:  Shortness of breath. EXAM: PORTABLE CHEST 1 VIEW COMPARISON:  05/17/2014. FINDINGS: Mediastinum normal. Cardiomegaly. Low lung volumes. Mild right base infiltrate/edema. No pleural effusion or pneumothorax . IMPRESSION: 1.  Cardiomegaly. 2.  Low lung volumes.  Mild right base infiltrate/edema . Electronically Signed   By: Marcello Moores  Register   On: 11/25/2015 07:12    Microbiology: Recent Results (from the past 240 hour(s))  MRSA PCR Screening     Status: None   Collection Time: 11/25/15 10:49 AM  Result Value Ref Range Status   MRSA by PCR NEGATIVE NEGATIVE Final    Comment:        The GeneXpert MRSA Assay (FDA approved for NASAL specimens only), is one component of a comprehensive MRSA colonization surveillance program. It is not intended to diagnose MRSA infection nor to guide or monitor treatment for MRSA infections.   Culture, blood (routine x 2) Call MD if unable to obtain prior to antibiotics being given     Status: None (Preliminary result)   Collection Time: 11/25/15 11:00 AM  Result Value Ref Range Status   Specimen Description BLOOD RIGHT ARM  Final   Special Requests BOTTLES DRAWN AEROBIC AND ANAEROBIC [place]  Final   Culture   Final    NO GROWTH 3 DAYS Performed at [facility]    Report Status PENDING  Incomplete  Culture, blood (routine x 2) Call MD if unable to obtain prior to antibiotics being given     Status: None (Preliminary result)   Collection Time: 11/25/15 11:10 AM  Result Value Ref Range Status   Specimen Description BLOOD RIGHT ANTECUBITAL  Final   Special Requests BOTTLES DRAWN AEROBIC AND ANAEROBIC 10CC  Final   Culture   Final    NO GROWTH 3 DAYS Performed at [facility]    Report Status PENDING  Incomplete  Respiratory Panel by PCR     Status: None   Collection Time: 11/25/15  3:01 PM  Result Value Ref Range Status   Adenovirus NOT DETECTED NOT DETECTED Final   Coronavirus  229E NOT DETECTED NOT DETECTED Final   Coronavirus HKU1 NOT DETECTED NOT DETECTED Final   Coronavirus NL63 NOT DETECTED NOT DETECTED Final   Coronavirus OC43 NOT DETECTED NOT DETECTED Final   Metapneumovirus NOT DETECTED NOT DETECTED Final   Rhinovirus /  Enterovirus NOT DETECTED NOT DETECTED Final   Influenza A NOT DETECTED NOT DETECTED Final   Influenza B NOT DETECTED NOT DETECTED Final   Parainfluenza Virus 1 NOT DETECTED NOT DETECTED Final   Parainfluenza Virus 2 NOT DETECTED NOT DETECTED Final   Parainfluenza Virus 3 NOT DETECTED NOT DETECTED Final   Parainfluenza Virus 4 NOT DETECTED NOT DETECTED Final   Respiratory Syncytial Virus NOT DETECTED NOT DETECTED Final   Bordetella pertussis NOT DETECTED NOT DETECTED Final   Chlamydophila pneumoniae NOT DETECTED NOT DETECTED Final   Mycoplasma pneumoniae NOT DETECTED NOT DETECTED Final    Comment: Performed at [facility]  Culture, sputum-assessment     Status: None   Collection Time: 11/25/15  5:37 PM  Result Value Ref Range Status   Specimen Description EXPECTORATED SPUTUM  Final   Special Requests NONE  Final   Sputum evaluation   Final    THIS SPECIMEN IS ACCEPTABLE. RESPIRATORY CULTURE REPORT TO FOLLOW.   Report Status 11/25/2015 FINAL  Final  Culture, respiratory (NON-Expectorated)     Status: None   Collection Time: 11/25/15  5:37 PM  Result Value Ref Range Status   Specimen Description SPUTUM  Final   Special Requests NONE  Final   Gram Stain   Final    NO WBC SEEN RARE SQUAMOUS EPITHELIAL CELLS PRESENT FEW GRAM POSITIVE COCCI IN PAIRS AND CHAINS IN CLUSTERS RARE GRAM POSITIVE RODS RARE GRAM NEGATIVE RODS    Culture   Final    Consistent with normal respiratory flora. Performed at [facility]    Report Status 11/28/2015 FINAL  Final     Labs: Basic Metabolic Panel:  Recent Labs Lab 11/25/15 0646 11/25/15 1110 11/26/15 0401 11/27/15 0622  NA 139 141 139 139  K 4.1 4.5 4.6 4.4  CL 103 99*  101 101  CO2 30 35* 32 32  GLUCOSE 138* 96 128* 127*  BUN 13 13 14 18   CREATININE 1.16 1.18 0.96 0.89  CALCIUM 8.6* 9.0 8.7* 9.0  MG  --  1.8  --   --   PHOS  --  5.0*  --   --    Liver Function Tests:  Recent Labs Lab 11/25/15 1110 11/26/15 0401  AST 18 16  ALT 18 16*  ALKPHOS 39 33*  BILITOT 0.6 0.5  PROT 8.0 7.2  ALBUMIN 4.2 3.8   No results for input(s): LIPASE, AMYLASE in the last 168 hours. No results for input(s): AMMONIA in the last 168 hours. CBC:  Recent Labs Lab 11/25/15 0646 11/25/15 1110 11/26/15 0401 11/27/15 0622 11/28/15 0548  WBC 6.2 8.0 8.1 9.9 10.7*  NEUTROABS 4.2 6.9  --   --   --   HGB 11.4* 12.4* 11.8* 11.9* 12.2*  HCT 38.9* 42.6 39.9 40.2 41.0  MCV 89.4 89.9 88.9 87.2 87.2  PLT 227 142* 256 232 232   Cardiac Enzymes:  Recent Labs Lab 11/25/15 1110 11/25/15 1640 11/25/15 2221  TROPONINI 0.03* <0.03 <0.03   BNP: BNP (last 3 results)  Recent Labs  11/25/15 0646  BNP 199.2*    ProBNP (last 3 results) No results for input(s): PROBNP in the last 8760 hours.  CBG:  Recent Labs Lab 11/27/15 0733 11/27/15 1145 11/27/15 1658 11/27/15 2132 11/28/15 0756  GLUCAP 109* 207* 117* 170* 99

## 2015-11-28 NOTE — Consult Note (Signed)
Cardiology Consultation Note    Patient ID: John Ackerman., MRN: [id number], DOB/AGE: December 04, 1975 40 y.o. Admit date: 11/25/2015   Date of Consult: 11/28/2015 Primary Physician: Cletis Athens, MD Primary Cardiologist: New  Chief Complaint: SOB Reason for Consultation: EF 45-50% with RV dysfunction ?CHF sx Requesting MD: Dr. Charlies Silvers  HPI: John [name]. is a 40 y.o. male with history of morbid obesity, paranoid schizophrenia, DM, asthma, anemia, sleep apnea (not presently using CPAP), tobacco abuse who presented to [facility] 11/25/15 who was brought in with acute hypoxic respiratory failure. The patient is extremely tangential and difficult to follow. The history below was obtained from the chart - he is in a  a group home and for Thanksgiving his grandma picked him up to go to dinner. After the dinner patient complained of dizziness and shortness of breath. He also had reported cough x 2-3 days with productive white sputum. In the ED he was hypertensive 150/90, mildly tachycardic, and pulse ox 83% RA requiring BiPAP. CXR showed cardiomegaly, right base infiltrate / edema. He has been treated for acute exacerbation of asthma +/- PNA with steroids, nebs, and Levaquin. 2D Echo was obtained 11/25/15 showing EF 45-50%, mild diffuse HK, mildly dilated LA, mod dilated RV with RV systolic function reduced, hypokinesis of RV lateral free wall. Labs this admission notable for troponin 0.03 then neg x 2, BNP 199. Received 40mg  IV lasix with wt 362 -> 359. Reported dry weight is 360lb. He states he has been occasionally smoking with [place]. He then went on to say (seemingly at random) he doesn't like going to the bathroom because it makes his private hard and then girls might see it so he tries not to. He then proceeded to ask me if there is marijuana out there. He denies any illicit drug use (when I asked him, he replied with "what's cocaine?"). He says that 22 days ago when he had a puff of a  cigarette he felt brief chest pain but hasn't felt any sense that time. Denies any family history of CAD. He currently states he feels well. SOB improving. He gives an explanation that I can't really follow as to why he is not using his CPAP.   Past Medical History:  Diagnosis Date  . Anemia   . Asthma   . Diabetes mellitus   . Morbid obesity ([place])   . Paranoid schizophrenia ([place])   . Personality disorder   . Sleep apnea   . Thyroid disease       Surgical History:  Past Surgical History:  Procedure Laterality Date  . FRACTURE SURGERY       Home Meds: Prior to Admission medications   Medication Sig Start Date End Date Taking? Authorizing Provider  Armodafinil 250 MG tablet Take 250 mg by mouth daily.   Yes Historical Provider, MD  atenolol (TENORMIN) 100 MG tablet Take 100 mg by mouth daily.   Yes Historical Provider, MD  clonazePAM (KLONOPIN) 0.5 MG tablet Take 0.5 mg by mouth 3 (three) times daily.   Yes Historical Provider, MD  cloZAPine (CLOZARIL) 100 MG tablet Take 450 mg by mouth at bedtime.   Yes Historical Provider, MD  divalproex (DEPAKOTE ER) 500 MG 24 hr tablet Take 2,000 mg by mouth at bedtime.    Yes Historical Provider, MD  glipiZIDE (GLUCOTROL XL) 5 MG 24 hr tablet Take 5 mg by mouth 2 (two) times daily.   Yes Historical Provider, MD  levothyroxine (SYNTHROID,  LEVOTHROID) 75 MCG tablet Take 75 mcg by mouth daily.   Yes Historical Provider, MD  lithium carbonate (ESKALITH) 450 MG CR tablet Take 450 mg by mouth at bedtime.   Yes Historical Provider, MD  loratadine (CLARITIN) 10 MG tablet Take 10 mg by mouth daily.   Yes Historical Provider, MD  meloxicam (MOBIC) 7.5 MG tablet Take 7.5 mg by mouth daily.   Yes Historical Provider, MD  metFORMIN (GLUCOPHAGE) 1000 MG tablet Take 1,000 mg by mouth 2 (two) times daily with a meal.   Yes Historical Provider, MD  Multiple Vitamins-Minerals (MULTIVITAMINS THER. W/MINERALS) TABS tablet Take 1 tablet by mouth daily.   Yes  Historical Provider, MD  omeprazole (PRILOSEC) 20 MG capsule Take 20 mg by mouth daily.   Yes Historical Provider, MD  oxybutynin (DITROPAN) 5 MG tablet Take 5 mg by mouth daily.   Yes Historical Provider, MD  simvastatin (ZOCOR) 20 MG tablet Take 20 mg by mouth at bedtime.    Yes Historical Provider, MD    Inpatient Medications:  . atenolol  100 mg Oral Daily  . chlorhexidine  15 mL Mouth Rinse BID  . clozapine  450 mg Oral QHS  . divalproex  1,000 mg Oral QHS  . insulin aspart  0-5 Units Subcutaneous QHS  . insulin aspart  0-9 Units Subcutaneous TID WC  . ipratropium-albuterol  3 mL Nebulization BID  . levofloxacin  750 mg Oral Daily  . levothyroxine  75 mcg Oral QAC breakfast  . lithium carbonate  450 mg Oral QHS  . loratadine  10 mg Oral Daily  . mouth rinse  15 mL Mouth Rinse q12n4p  . meloxicam  7.5 mg Oral Daily  . modafinil  200 mg Oral Daily  . multivitamin with minerals  1 tablet Oral Daily  . oxybutynin  5 mg Oral Daily  . pantoprazole  40 mg Oral Daily  . predniSONE  60 mg Oral Q breakfast  . simvastatin  20 mg Oral q1800  . sodium chloride flush  3 mL Intravenous Q12H   . sodium chloride 10 mL/hr at 11/26/15 1300    Allergies: No Known Allergies  Social History   Social History  . Marital status: Single    Spouse name: N/A  . Number of children: N/A  . Years of education: N/A   Occupational History  . Not on file.   Social History Main Topics  . Smoking status: Light Tobacco Smoker  . Smokeless tobacco: Never Used  . Alcohol use Yes     Comment: only once in a while  . Drug use: No  . Sexual activity: Not on file   Other Topics Concern  . Not on file   Social History Narrative  . No narrative on file     Family History  Problem Relation Age of Onset  . Glaucoma Mother      Review of Systems:no fevers, chills. No bleeding. All other systems reviewed and are otherwise negative except as noted above.  Labs:  Recent Labs  11/25/15 1640  11/25/15 2221  TROPONINI <0.03 <0.03   Lab Results  Component Value Date   WBC 10.7 (H) 11/28/2015   HGB 12.2 (L) 11/28/2015   HCT 41.0 11/28/2015   MCV 87.2 11/28/2015   PLT 232 11/28/2015    Recent Labs Lab 11/26/15 0401 11/27/15 0622  NA 139 139  K 4.6 4.4  CL 101 101  CO2 32 32  BUN 14 18  CREATININE 0.96 0.89  CALCIUM 8.7* 9.0  PROT 7.2  --   BILITOT 0.5  --   ALKPHOS 33*  --   ALT 16*  --   AST 16  --   GLUCOSE 128* 127*   Lab Results  Component Value Date   CHOL 146 11/24/2013   HDL 22 (L) 11/24/2013   LDLCALC 94 11/24/2013   TRIG 152 11/24/2013   No results found for: DDIMER  Radiology/Studies:  Dg Chest Port 1 View  Result Date: 11/25/2015 CLINICAL DATA:  Shortness of breath. EXAM: PORTABLE CHEST 1 VIEW COMPARISON:  05/17/2014. FINDINGS: Mediastinum normal. Cardiomegaly. Low lung volumes. Mild right base infiltrate/edema. No pleural effusion or pneumothorax . IMPRESSION: 1.  Cardiomegaly. 2.  Low lung volumes.  Mild right base infiltrate/edema . Electronically Signed   By: Marcello Moores  Register   On: 11/25/2015 07:12    Wt Readings from Last 3 Encounters:  11/26/15 (!) 359 lb 12.7 oz (163.2 kg)  09/06/14 (!) 346 lb (156.9 kg)  09/29/08 (!) 333 lb 6.1 oz (151.2 kg)    EKG: sinus tach without significant ST-T changes  Physical Exam: Blood pressure (!) 151/83, pulse 81, temperature 97.8 F (36.6 C), temperature source Oral, resp. rate 18, height 6\' 2"  (1.88 m), weight (!) 359 lb 12.7 oz (163.2 kg), SpO2 98 %. Body mass index is 46.19 kg/m. General: Morbidly obese AAM in no acute distress. Head: Normocephalic, atraumatic, sclera non-icteric, no xanthomas, nares are without discharge.  Neck: Negative for carotid bruits. JVD not elevated. Lungs: Clear bilaterally to auscultation without wheezes, rales, or rhonchi. Breathing is unlabored. Heart: RRR with S1 S2. No murmurs, rubs, or gallops appreciated. Abdomen: Soft, non-tender, non-distended with  normoactive bowel sounds. No hepatomegaly. No rebound/guarding. No obvious abdominal masses. Msk:  Strength and tone appear normal for age. Extremities: No clubbing or cyanosis. Large baseline leg habitus with chronic skin thickening indicative of venous stasis, difficult to assess how much is bodily fat versus edema. Distal pedal pulses in tact. Neuro: Alert and oriented X 3. Slow cadence of speech at times. Psych:  Tangential, odd affect.     Assessment and Plan  40 y.o. male with history of morbid obesity, paranoid schizophrenia (lives in a group home), DM, asthma, anemia, sleep apnea (not presently using CPAP), tobacco abuse who presented to [facility] 11/25/15 who was brought in with acute hypoxic respiratory failure. Being treated for asthma exacerbation +/- PNA. 2D echo showing EF 45-50% with RV dysfunction as well.  1. Acute hypoxic respiratory failure - suspect multifactorial due to asthma, untreated OSA, possible OHS, and ?component of HF.  2. New cardiomyopathy with mild LV dysfunction along with moderate RV dysfunction - may be due to hypertensive heart disease or untreated sleep apnea. Volume status near impossible to assess given his habitus. With the above treatment he is symptomatically improved. He is already on beta blocker. Would benefit from initiation of ACEI/ARB. Will review further evaluation with MD.  3. Tobacco abuse - cessation advised.   4. HTN - consider ACEI/ARB as above.  Signed, John Pitter PA-C 11/28/2015, 11:13 AM Pager: [phone]  I have personally seen and examined this patient with John Copa, PA-C. I agree with the assessment and plan as outlined above. My exam shows an obese male with tangential thoughts. He is alert and oriented x 3. CV:RRR. Lungs:clear bilat. Ext: trace edema. Labs reviewed. EKG reviewed.  He has no evidence of volume overload. Echo with low normal LV systolic function. Slight RV dysfunction likely due to long  standing pulmonary issues. I  would not start Lasix. I would start an ARB and continue beta blocker. No indication for an ischemic evaluation. OK to d/c home and f/u in our [facility] office in [place].   John [name] 11/28/2015 12:24 PM

## 2015-11-28 NOTE — Discharge Instructions (Signed)
Hypoxemia Hypoxemia occurs when your blood does not contain enough oxygen. The body cannot work well when it does not have enough oxygen because every part of your body needs oxygen. Oxygen travels to all parts of the body through your blood. Hypoxemia can develop suddenly or can come on slowly. CAUSES Some common causes of hypoxemia include:  Long-term (chronic) lung diseases, such as chronic obstructive pulmonary disease (COPD) or interstitial lung disease.  Disorders that affect breathing at night, such as sleep apnea.  Fluid buildup in your lungs (pulmonary edema).  Lung infection (pneumonia).  Lung or throat cancer.  Abnormal blood flow that bypasses the lungs (shunt).  Certain diseasesthat affect nerves or muscles.  A collapsed lung (pneumothorax).  A blood clot in the lungs (pulmonary embolus).  Certain types of heart disease.  Slow or shallow breathing (hypoventilation).  Certain medicines.  High altitudes.  Toxic chemicals and gases. SIGNS AND SYMPTOMS Not everyone who has hypoxemia will develop symptoms. If the hypoxemia developed quickly, you will likely have symptoms such as shortness of breath. If the hypoxemia came on slowly over months or years, you may not notice any symptoms. Symptoms can include:  Shortness of breath (dyspnea).  Bluish color of the skin, lips, or nail beds.  Breathing that is fast, noisy, or shallow.  A fast heartbeat.  Feeling tired or sleepy.  Being confused or feeling anxious. DIAGNOSIS To determine if you have hypoxemia, your health care provider may perform:  A physical exam.  Blood tests.  A pulse oximetry. A sensor will be put on your finger, toe, or earlobe to measure the percent of oxygen in your blood. TREATMENT You will likely be treated with oxygen therapy. Depending on the cause of your hypoxemia, you may need oxygen for a short time (weeks or months), or you may need it indefinitely. Your health care provider  may also recommend other therapies to treat the underlying cause of your hypoxemia. HOME CARE INSTRUCTIONS  Only take over-the-counter or prescription medicines as directed by your health care provider.  Follow oxygen safety measures if you are on oxygen therapy. These may include:  Always having a backup supply of oxygen.  Not allowing anyone to smoke around oxygen.  Handling the oxygen tanks carefully and as instructed.  If you smoke, quit. Stay away from people who smoke.  Follow up with your health care provider as directed. SEEK MEDICAL CARE IF:  You have any concerns about your oxygen therapy.  You still have trouble breathing.  You become short of breath when you exercise.  You are tired when you wake up.  You have a headache when you wake up. SEEK IMMEDIATE MEDICAL CARE IF:   Your breathing gets worse.  You have new shortness of breath with normal activity.  You have a bluish color of the skin, lips, or nail beds.  You have confusion or cloudy thinking.  You cough up dark mucus.  You have chest pain.  You have a fever. MAKE SURE YOU:  Understand these instructions.  Will watch your condition.  Will get help right away if you are not doing well or get worse. This information is not intended to replace advice given to you by your health care provider. Make sure you discuss any questions you have with your health care provider. Document Released: 07/03/2010 Document Revised: 12/23/2012 Document Reviewed: 07/17/2012 Elsevier Interactive Patient Education  2017 [employer].

## 2015-11-28 NOTE — Progress Notes (Signed)
Date: November 28, 2015 Discharge orders checked for needs. No case management needs present at time of discharge. Velva Harman, RN, BSN, [place]   [phone]

## 2015-11-29 ENCOUNTER — Telehealth: Payer: Self-pay | Admitting: Internal Medicine

## 2015-11-29 NOTE — Telephone Encounter (Signed)
Tried to call patient and schedule appointment here in our office, no answer no voicemail  Will try again later.

## 2015-11-29 NOTE — Telephone Encounter (Signed)
-----   Message from Charlie Pitter, [place] sent at 11/28/2015  5:36 PM EST ----- Regarding: Needs f/u Synetta Shadow! Dr. Angelena Form and I saw this patient today and he went home. He needs to establish with one of our docs in the [place] office. After interacting with him, I feel that he would best be served with a male cardiologist (he has a h/o paranoid schizophrenia and had made some tangential comments about his private parts and embarrassment with females). Please arrange follow-up in 3-4 weeks and have someone call him. Thank you! Dayna Dunn PA-C

## 2015-11-30 LAB — CULTURE, BLOOD (ROUTINE X 2)
CULTURE: NO GROWTH
Culture: NO GROWTH

## 2015-12-01 ENCOUNTER — Ambulatory Visit: Payer: Medicare Other | Admitting: Podiatry

## 2015-12-05 NOTE — Telephone Encounter (Signed)
Lmov for patient to call back and schedule a FU appointment.

## 2015-12-13 ENCOUNTER — Ambulatory Visit: Payer: Medicare Other | Admitting: Internal Medicine

## 2015-12-13 ENCOUNTER — Encounter: Payer: Self-pay | Admitting: *Deleted

## 2015-12-15 DIAGNOSIS — E669 Obesity, unspecified: Secondary | ICD-10-CM | POA: Diagnosis not present

## 2015-12-15 DIAGNOSIS — E119 Type 2 diabetes mellitus without complications: Secondary | ICD-10-CM | POA: Diagnosis not present

## 2015-12-16 DIAGNOSIS — R05 Cough: Secondary | ICD-10-CM | POA: Diagnosis not present

## 2015-12-20 ENCOUNTER — Emergency Department: Payer: Medicare Other

## 2015-12-20 ENCOUNTER — Encounter: Payer: Self-pay | Admitting: Medical Oncology

## 2015-12-20 ENCOUNTER — Inpatient Hospital Stay
Admission: EM | Admit: 2015-12-20 | Discharge: 2015-12-28 | DRG: 193 | Disposition: A | Discharge status: Skilled Nursing Facility | Payer: Medicare Other | Attending: Internal Medicine | Admitting: Internal Medicine

## 2015-12-20 DIAGNOSIS — Z79899 Other long term (current) drug therapy: Secondary | ICD-10-CM

## 2015-12-20 DIAGNOSIS — Q741 Congenital malformation of knee: Secondary | ICD-10-CM

## 2015-12-20 DIAGNOSIS — J9600 Acute respiratory failure, unspecified whether with hypoxia or hypercapnia: Secondary | ICD-10-CM | POA: Diagnosis not present

## 2015-12-20 DIAGNOSIS — J810 Acute pulmonary edema: Secondary | ICD-10-CM | POA: Diagnosis not present

## 2015-12-20 DIAGNOSIS — Y95 Nosocomial condition: Secondary | ICD-10-CM | POA: Diagnosis present

## 2015-12-20 DIAGNOSIS — R52 Pain, unspecified: Secondary | ICD-10-CM

## 2015-12-20 DIAGNOSIS — M7651 Patellar tendinitis, right knee: Secondary | ICD-10-CM | POA: Diagnosis present

## 2015-12-20 DIAGNOSIS — R0600 Dyspnea, unspecified: Secondary | ICD-10-CM

## 2015-12-20 DIAGNOSIS — J440 Chronic obstructive pulmonary disease with acute lower respiratory infection: Secondary | ICD-10-CM | POA: Diagnosis present

## 2015-12-20 DIAGNOSIS — Z7984 Long term (current) use of oral hypoglycemic drugs: Secondary | ICD-10-CM | POA: Diagnosis not present

## 2015-12-20 DIAGNOSIS — Z7401 Bed confinement status: Secondary | ICD-10-CM | POA: Diagnosis not present

## 2015-12-20 DIAGNOSIS — J9601 Acute respiratory failure with hypoxia: Secondary | ICD-10-CM | POA: Diagnosis not present

## 2015-12-20 DIAGNOSIS — E669 Obesity, unspecified: Secondary | ICD-10-CM | POA: Diagnosis not present

## 2015-12-20 DIAGNOSIS — I10 Essential (primary) hypertension: Secondary | ICD-10-CM | POA: Diagnosis present

## 2015-12-20 DIAGNOSIS — R0689 Other abnormalities of breathing: Secondary | ICD-10-CM

## 2015-12-20 DIAGNOSIS — F200 Paranoid schizophrenia: Secondary | ICD-10-CM | POA: Diagnosis present

## 2015-12-20 DIAGNOSIS — J9690 Respiratory failure, unspecified, unspecified whether with hypoxia or hypercapnia: Secondary | ICD-10-CM | POA: Diagnosis present

## 2015-12-20 DIAGNOSIS — I429 Cardiomyopathy, unspecified: Secondary | ICD-10-CM | POA: Diagnosis present

## 2015-12-20 DIAGNOSIS — F609 Personality disorder, unspecified: Secondary | ICD-10-CM | POA: Diagnosis present

## 2015-12-20 DIAGNOSIS — M109 Gout, unspecified: Secondary | ICD-10-CM | POA: Diagnosis present

## 2015-12-20 DIAGNOSIS — G4733 Obstructive sleep apnea (adult) (pediatric): Secondary | ICD-10-CM | POA: Diagnosis present

## 2015-12-20 DIAGNOSIS — N3281 Overactive bladder: Secondary | ICD-10-CM | POA: Diagnosis not present

## 2015-12-20 DIAGNOSIS — G4730 Sleep apnea, unspecified: Secondary | ICD-10-CM | POA: Diagnosis not present

## 2015-12-20 DIAGNOSIS — E039 Hypothyroidism, unspecified: Secondary | ICD-10-CM | POA: Diagnosis present

## 2015-12-20 DIAGNOSIS — K219 Gastro-esophageal reflux disease without esophagitis: Secondary | ICD-10-CM | POA: Diagnosis not present

## 2015-12-20 DIAGNOSIS — H409 Unspecified glaucoma: Secondary | ICD-10-CM | POA: Diagnosis not present

## 2015-12-20 DIAGNOSIS — E119 Type 2 diabetes mellitus without complications: Secondary | ICD-10-CM | POA: Diagnosis present

## 2015-12-20 DIAGNOSIS — Z6841 Body Mass Index (BMI) 40.0 and over, adult: Secondary | ICD-10-CM | POA: Diagnosis not present

## 2015-12-20 DIAGNOSIS — J9602 Acute respiratory failure with hypercapnia: Secondary | ICD-10-CM | POA: Diagnosis present

## 2015-12-20 DIAGNOSIS — Z741 Need for assistance with personal care: Secondary | ICD-10-CM | POA: Diagnosis not present

## 2015-12-20 DIAGNOSIS — J45901 Unspecified asthma with (acute) exacerbation: Secondary | ICD-10-CM | POA: Diagnosis not present

## 2015-12-20 DIAGNOSIS — M25561 Pain in right knee: Secondary | ICD-10-CM | POA: Diagnosis not present

## 2015-12-20 DIAGNOSIS — R262 Difficulty in walking, not elsewhere classified: Secondary | ICD-10-CM | POA: Diagnosis not present

## 2015-12-20 DIAGNOSIS — E7800 Pure hypercholesterolemia, unspecified: Secondary | ICD-10-CM | POA: Diagnosis not present

## 2015-12-20 DIAGNOSIS — F17200 Nicotine dependence, unspecified, uncomplicated: Secondary | ICD-10-CM | POA: Diagnosis present

## 2015-12-20 DIAGNOSIS — Z4659 Encounter for fitting and adjustment of other gastrointestinal appliance and device: Secondary | ICD-10-CM

## 2015-12-20 DIAGNOSIS — M179 Osteoarthritis of knee, unspecified: Secondary | ICD-10-CM | POA: Diagnosis not present

## 2015-12-20 DIAGNOSIS — R0602 Shortness of breath: Secondary | ICD-10-CM | POA: Diagnosis not present

## 2015-12-20 DIAGNOSIS — R0902 Hypoxemia: Secondary | ICD-10-CM

## 2015-12-20 DIAGNOSIS — J189 Pneumonia, unspecified organism: Secondary | ICD-10-CM | POA: Diagnosis not present

## 2015-12-20 DIAGNOSIS — F39 Unspecified mood [affective] disorder: Secondary | ICD-10-CM | POA: Diagnosis not present

## 2015-12-20 DIAGNOSIS — E785 Hyperlipidemia, unspecified: Secondary | ICD-10-CM | POA: Diagnosis present

## 2015-12-20 DIAGNOSIS — M1040 Other secondary gout, unspecified site: Secondary | ICD-10-CM | POA: Diagnosis not present

## 2015-12-20 DIAGNOSIS — M79661 Pain in right lower leg: Secondary | ICD-10-CM | POA: Diagnosis not present

## 2015-12-20 DIAGNOSIS — J449 Chronic obstructive pulmonary disease, unspecified: Secondary | ICD-10-CM | POA: Diagnosis not present

## 2015-12-20 DIAGNOSIS — F419 Anxiety disorder, unspecified: Secondary | ICD-10-CM | POA: Diagnosis not present

## 2015-12-20 LAB — BLOOD GAS, VENOUS
Acid-Base Excess: 8.3 mmol/L — ABNORMAL HIGH (ref 0.0–2.0)
BICARBONATE: 38 mmol/L — AB (ref 20.0–28.0)
PCO2 VEN: 79 mmHg — AB (ref 44.0–60.0)
PH VEN: 7.29 (ref 7.250–7.430)
Patient temperature: 37

## 2015-12-20 LAB — BLOOD GAS, ARTERIAL
ACID-BASE EXCESS: 7.6 mmol/L — AB (ref 0.0–2.0)
ACID-BASE EXCESS: 9.6 mmol/L — AB (ref 0.0–2.0)
BICARBONATE: 39.4 mmol/L — AB (ref 20.0–28.0)
Bicarbonate: 36.9 mmol/L — ABNORMAL HIGH (ref 20.0–28.0)
Expiratory PAP: 6
FIO2: 30
FIO2: 0.4
Inspiratory PAP: 14
O2 SAT: 95.3 %
O2 Saturation: 98.9 %
PATIENT TEMPERATURE: 37
PATIENT TEMPERATURE: 37
PCO2 ART: 75 mmHg — AB (ref 32.0–48.0)
PH ART: 7.29 — AB (ref 7.350–7.450)
pCO2 arterial: 82 mmHg (ref 32.0–48.0)
pH, Arterial: 7.3 — ABNORMAL LOW (ref 7.350–7.450)
pO2, Arterial: 85 mmHg (ref 83.0–108.0)
pO2, Arterial: 139 mmHg — ABNORMAL HIGH (ref 83.0–108.0)

## 2015-12-20 LAB — CBC
HCT: 39.2 % — ABNORMAL LOW (ref 40.0–52.0)
HEMOGLOBIN: 12.6 g/dL — AB (ref 13.0–18.0)
MCH: 26.4 pg (ref 26.0–34.0)
MCHC: 32.1 g/dL (ref 32.0–36.0)
MCV: 82.3 fL (ref 80.0–100.0)
PLATELETS: 157 10*3/uL (ref 150–440)
RBC: 4.76 MIL/uL (ref 4.40–5.90)
RDW: 16.5 % — ABNORMAL HIGH (ref 11.5–14.5)
WBC: 7 10*3/uL (ref 3.8–10.6)

## 2015-12-20 LAB — BASIC METABOLIC PANEL
ANION GAP: 7 (ref 5–15)
BUN: 7 mg/dL (ref 6–20)
CHLORIDE: 102 mmol/L (ref 101–111)
CO2: 31 mmol/L (ref 22–32)
Calcium: 8.4 mg/dL — ABNORMAL LOW (ref 8.9–10.3)
Creatinine, Ser: 1.02 mg/dL (ref 0.61–1.24)
GFR calc Af Amer: >60 mL/min (ref >60)
Glucose, Bld: 112 mg/dL — ABNORMAL HIGH (ref 65–99)
POTASSIUM: 4 mmol/L (ref 3.5–5.1)
SODIUM: 140 mmol/L (ref 135–145)

## 2015-12-20 LAB — GLUCOSE, CAPILLARY
Glucose-Capillary: 43 mg/dL — CL (ref 65–99)
Glucose-Capillary: 113 mg/dL — ABNORMAL HIGH (ref 65–99)
Glucose-Capillary: 53 mg/dL — ABNORMAL LOW (ref 65–99)
Glucose-Capillary: 95 mg/dL (ref 65–99)
Glucose-Capillary: 85 mg/dL (ref 65–99)

## 2015-12-20 LAB — PROCALCITONIN: Procalcitonin: <0.1 ng/mL

## 2015-12-20 LAB — TROPONIN I: Troponin I: <0.03 ng/mL (ref <0.03)

## 2015-12-20 LAB — MRSA PCR SCREENING: MRSA by PCR: NEGATIVE

## 2015-12-20 LAB — BRAIN NATRIURETIC PEPTIDE: B NATRIURETIC PEPTIDE 5: 55 pg/mL (ref 0.0–100.0)

## 2015-12-20 MED ORDER — FUROSEMIDE 10 MG/ML IJ SOLN
60.0000 mg | Freq: Once | INTRAMUSCULAR | Status: AC
  Administered 2015-12-20: 60 mg via INTRAVENOUS
  Filled 2015-12-20: qty 8

## 2015-12-20 MED ORDER — INSULIN ASPART 100 UNIT/ML ~~LOC~~ SOLN
2.0000 [IU] | SUBCUTANEOUS | Status: DC
  Administered 2015-12-22 (×2): 2 [IU] via SUBCUTANEOUS
  Filled 2015-12-20 (×3): qty 2

## 2015-12-20 MED ORDER — LEVOTHYROXINE SODIUM 75 MCG PO TABS
75.0000 ug | ORAL_TABLET | Freq: Every day | ORAL | Status: DC
  Administered 2015-12-21 – 2015-12-28 (×8): 75 ug via ORAL
  Filled 2015-12-20 (×8): qty 1

## 2015-12-20 MED ORDER — DEXTROSE 50 % IV SOLN
1.0000 | Freq: Once | INTRAVENOUS | Status: AC
  Administered 2015-12-20: 50 mL via INTRAVENOUS

## 2015-12-20 MED ORDER — SODIUM CHLORIDE 0.9 % IV SOLN: 250.0000 mL | INTRAVENOUS | Status: DC | PRN

## 2015-12-20 MED ORDER — MOMETASONE FURO-FORMOTEROL FUM 200-5 MCG/ACT IN AERO
2.0000 | INHALATION_SPRAY | Freq: Two times a day (BID) | RESPIRATORY_TRACT | Status: DC
  Administered 2015-12-21 – 2015-12-27 (×14): 2 via RESPIRATORY_TRACT
  Filled 2015-12-20 (×2): qty 8.8

## 2015-12-20 MED ORDER — LOSARTAN POTASSIUM 50 MG PO TABS
50.0000 mg | ORAL_TABLET | Freq: Every day | ORAL | Status: DC
  Administered 2015-12-21 – 2015-12-28 (×8): 50 mg via ORAL
  Filled 2015-12-20 (×8): qty 1

## 2015-12-20 MED ORDER — IOPAMIDOL (ISOVUE-370) INJECTION 76%
100.0000 mL | Freq: Once | INTRAVENOUS | Status: AC | PRN
  Administered 2015-12-20: 100 mL via INTRAVENOUS

## 2015-12-20 MED ORDER — ENOXAPARIN SODIUM 40 MG/0.4ML ~~LOC~~ SOLN: 30.0000 mg | Freq: Two times a day (BID) | SUBCUTANEOUS | Status: DC

## 2015-12-20 MED ORDER — DIVALPROEX SODIUM ER 500 MG PO TB24
2000.0000 mg | ORAL_TABLET | Freq: Every day | ORAL | Status: DC
  Administered 2015-12-20 – 2015-12-27 (×8): 2000 mg via ORAL
  Filled 2015-12-20: qty 8
  Filled 2015-12-20: qty 4
  Filled 2015-12-20: qty 8
  Filled 2015-12-20 (×2): qty 4
  Filled 2015-12-20: qty 8
  Filled 2015-12-20 (×2): qty 4

## 2015-12-20 MED ORDER — VANCOMYCIN HCL IN DEXTROSE 1-5 GM/200ML-% IV SOLN
1000.0000 mg | Freq: Once | INTRAVENOUS | Status: AC
  Administered 2015-12-20: 1000 mg via INTRAVENOUS
  Filled 2015-12-20: qty 200

## 2015-12-20 MED ORDER — SIMVASTATIN 20 MG PO TABS
20.0000 mg | ORAL_TABLET | Freq: Every day | ORAL | Status: DC
  Administered 2015-12-20 – 2015-12-27 (×8): 20 mg via ORAL
  Filled 2015-12-20 (×8): qty 1

## 2015-12-20 MED ORDER — ATENOLOL 50 MG PO TABS
100.0000 mg | ORAL_TABLET | Freq: Every day | ORAL | Status: DC
  Administered 2015-12-21 – 2015-12-28 (×8): 100 mg via ORAL
  Filled 2015-12-20 (×3): qty 2
  Filled 2015-12-20: qty 4
  Filled 2015-12-20 (×4): qty 2

## 2015-12-20 MED ORDER — IOPAMIDOL (ISOVUE-370) INJECTION 76%: 75.0000 mL | Freq: Once | INTRAVENOUS | Status: DC | PRN

## 2015-12-20 MED ORDER — PIPERACILLIN-TAZOBACTAM 4.5 G IVPB
4.5000 g | Freq: Three times a day (TID) | INTRAVENOUS | Status: DC
  Administered 2015-12-20 – 2015-12-21 (×2): 4.5 g via INTRAVENOUS
  Filled 2015-12-20 (×4): qty 100

## 2015-12-20 MED ORDER — LITHIUM CARBONATE ER 450 MG PO TBCR
450.0000 mg | EXTENDED_RELEASE_TABLET | Freq: Every day | ORAL | Status: DC
  Administered 2015-12-20 – 2015-12-27 (×8): 450 mg via ORAL
  Filled 2015-12-20 (×8): qty 1

## 2015-12-20 MED ORDER — DEXTROSE 50 % IV SOLN
INTRAVENOUS | Status: AC
  Filled 2015-12-20: qty 50

## 2015-12-20 MED ORDER — CLONAZEPAM 0.5 MG PO TABS
0.5000 mg | ORAL_TABLET | Freq: Three times a day (TID) | ORAL | Status: DC
  Administered 2015-12-20 – 2015-12-21 (×4): 0.5 mg via ORAL
  Filled 2015-12-20 (×5): qty 1

## 2015-12-20 MED ORDER — ENOXAPARIN SODIUM 40 MG/0.4ML ~~LOC~~ SOLN
40.0000 mg | Freq: Two times a day (BID) | SUBCUTANEOUS | Status: DC
  Administered 2015-12-21 – 2015-12-28 (×14): 40 mg via SUBCUTANEOUS
  Filled 2015-12-20 (×14): qty 0.4

## 2015-12-20 MED ORDER — PIPERACILLIN-TAZOBACTAM 3.375 G IVPB 30 MIN
3.3750 g | Freq: Once | INTRAVENOUS | Status: AC
  Administered 2015-12-20: 3.375 g via INTRAVENOUS
  Filled 2015-12-20: qty 50

## 2015-12-20 MED ORDER — VANCOMYCIN HCL 10 G IV SOLR
1500.0000 mg | Freq: Three times a day (TID) | INTRAVENOUS | Status: DC
  Administered 2015-12-21: 1500 mg via INTRAVENOUS
  Filled 2015-12-20 (×4): qty 1500

## 2015-12-20 MED ORDER — LEVOFLOXACIN IN D5W 750 MG/150ML IV SOLN: 750.0000 mg | Freq: Once | INTRAVENOUS | Status: DC

## 2015-12-20 NOTE — ED Notes (Signed)
Joseph Art , caretaker [phone]

## 2015-12-20 NOTE — ED Notes (Signed)
ED Provider at bedside. 

## 2015-12-20 NOTE — ED Notes (Signed)
RT at bedside with bipap

## 2015-12-20 NOTE — Progress Notes (Signed)
[facility] Progress Note Patient Name: John [name]. DOB: [birth year] MRN: [id number]   Date of Service  12/20/2015  HPI/Events of Note  Glucose levels low  eICU Interventions  Pt fully awake and answering questions now, will gave break off bipap and feed.      Intervention Category Major Interventions: Other:  Kaelan Emami 12/20/2015, 5:57 PM

## 2015-12-20 NOTE — Progress Notes (Signed)
[facility] Progress Note Patient Name: John [name]. DOB: May 18, 1975 MRN: [id number]   Date of Service  12/20/2015  HPI/Events of Note  40 yo M from group home, now with multifocal PNA, hypercarbic resp failure requiring bipap.  eICU Interventions  Check PCT Cont with Bipap Treat as HCAP, based CT Chest and clinical picture Full consult note to follow.      Intervention Category Evaluation Type: New Patient Evaluation  Alleyah Twombly 12/20/2015, 4:14 PM

## 2015-12-20 NOTE — ED Provider Notes (Signed)
[facility] Emergency Department Provider Note  Time seen: 11:24 AM  I have reviewed the triage vital signs and the nursing notes.   HISTORY  Chief Complaint Knee Pain and Shortness of Breath    HPI John [name]. is a 40 y.o. male with a past medicalhistory of anemia, asthma, diabetes, obesity, schizophrenia, who presents to the emergency department with difficulty breathing, somnolence and right leg pain. According to the patient's caregiver for the past several days he has been complaining of right leg pain. Today he has been extremely tired falling asleep while talking appears to be short of breath with occasional cough. Caregiver denies any fever. No vomiting. No apparent chest pain. Patient denies chest pain. Describes her leg pain as moderate located mostly behind the right knee.  Past Medical History:  Diagnosis Date  . Anemia   . Asthma   . Diabetes mellitus   . Morbid obesity ([place])   . Paranoid schizophrenia ([place])   . Personality disorder   . Sleep apnea   . Thyroid disease     Patient Active Problem List   Diagnosis Date Noted  . Cardiomyopathy ([place]) 11/28/2015  . Tobacco abuse 11/28/2015  . Acute respiratory failure with hypercapnia ([place])   . OSA (obstructive sleep apnea)   . Essential hypertension 11/25/2015  . Hypothyroidism 11/25/2015  . Paranoid schizophrenia, chronic condition ([place]) 11/25/2015  . Acute respiratory failure with hypoxia ([place])   . Acute exacerbation of COPD with asthma ([place])   . Dyslipidemia associated with type 2 diabetes mellitus ([place])   . Diabetes mellitus due to underlying condition without complication, without long-term current use of insulin ([place])   . Morbid obesity ([place]) 09/29/2008    Past Surgical History:  Procedure Laterality Date  . FRACTURE SURGERY      Prior to Admission medications   Medication Sig Start Date End Date Taking? Authorizing Provider  Armodafinil 250 MG tablet Take 250 mg by mouth  daily.    Historical Provider, MD  atenolol (TENORMIN) 100 MG tablet Take 100 mg by mouth daily.    Historical Provider, MD  clonazePAM (KLONOPIN) 0.5 MG tablet Take 0.5 mg by mouth 3 (three) times daily.    Historical Provider, MD  cloZAPine (CLOZARIL) 100 MG tablet Take 450 mg by mouth at bedtime.    Historical Provider, MD  clozapine (CLOZARIL) 200 MG tablet Take 1 tablet (200 mg total) by mouth daily. 11/28/15   Robbie Lis, MD  divalproex (DEPAKOTE ER) 500 MG 24 hr tablet Take 2,000 mg by mouth at bedtime.     Historical Provider, MD  glipiZIDE (GLUCOTROL XL) 5 MG 24 hr tablet Take 5 mg by mouth 2 (two) times daily.    Historical Provider, MD  levofloxacin (LEVAQUIN) 750 MG tablet Take 1 tablet (750 mg total) by mouth daily. 11/28/15   Robbie Lis, MD  levothyroxine (SYNTHROID, LEVOTHROID) 75 MCG tablet Take 75 mcg by mouth daily.    Historical Provider, MD  lithium carbonate (ESKALITH) 450 MG CR tablet Take 450 mg by mouth at bedtime.    Historical Provider, MD  loratadine (CLARITIN) 10 MG tablet Take 10 mg by mouth daily.    Historical Provider, MD  losartan (COZAAR) 50 MG tablet Take 1 tablet (50 mg total) by mouth daily. 11/28/15   Robbie Lis, MD  meloxicam (MOBIC) 7.5 MG tablet Take 7.5 mg by mouth daily.    Historical Provider, MD  metFORMIN (GLUCOPHAGE) 1000 MG tablet Take 1,000  mg by mouth 2 (two) times daily with a meal.    Historical Provider, MD  Multiple Vitamins-Minerals (MULTIVITAMINS THER. W/MINERALS) TABS tablet Take 1 tablet by mouth daily.    Historical Provider, MD  omeprazole (PRILOSEC) 20 MG capsule Take 20 mg by mouth daily.    Historical Provider, MD  oxybutynin (DITROPAN) 5 MG tablet Take 5 mg by mouth daily.    Historical Provider, MD  predniSONE (DELTASONE) 20 MG tablet Take 3 tablets (60 mg total) by mouth daily with breakfast. 11/29/15   Robbie Lis, MD  simvastatin (ZOCOR) 20 MG tablet Take 20 mg by mouth at bedtime.     Historical Provider, MD    No  Known Allergies  Family History  Problem Relation Age of Onset  . Glaucoma Mother     Social History Social History  Substance Use Topics  . Smoking status: Light Tobacco Smoker  . Smokeless tobacco: Never Used  . Alcohol use Yes     Comment: only once in a while    Review of Systems Constitutional: Negative for fever. Cardiovascular: Negative for chest pain. Respiratory: Positive for shortness of breath. Occasional cough. Gastrointestinal: Negative for abdominal pain Musculoskeletal: Positive for right leg pain. Neurological: Negative for headache 10-point ROS otherwise negative.  ____________________________________________   PHYSICAL EXAM:  VITAL SIGNS: ED Triage Vitals [12/20/15 0954]  Enc Vitals Group     BP 113/70     Pulse Rate 91     Resp 20     Temp 98.3 F (36.8 C)     Temp Source Oral     SpO2 92 %     Weight (!) 340 lb (154.2 kg)     Height 6\' 2"  (1.88 m)     Head Circumference      Peak Flow      Pain Score 8     Pain Loc      Pain Edu?      Excl. in [place]?     Constitutional: Alert, somnolent, answers questions appropriately and follows commands well falls asleep multiple times throughout examination. Eyes: Normal exam ENT   Head: Normocephalic and atraumatic   Mouth/Throat: Mucous membranes are moist. Cardiovascular: Normal rate, regular rhythm. No murmur Respiratory: Normal respiratory effort without tachypnea nor retractions. Breath sounds are clear and equal bilaterally. No wheezes/rales/rhonchi. Gastrointestinal: Soft and nontender. No distention.   Musculoskeletal: Nontender with normal range of motion in all extremities. Moderate lower extremity edema bilaterally, moderate tenderness palpation in the right popliteal fossa. Neurologic:  Normal speech and language. No gross focal neurologic deficits Skin:  Skin is warm, dry and intact.  Psychiatric: Mood and affect are normal.  ____________________________________________     EKG  EKG reviewed and interpreted by myself shows normal sinus rhythm at 89 bpm, narrow QRS, normal axis, normal intervals, no concerning ST changes.  ____________________________________________    RADIOLOGY  Chest x-ray consistent with interstitial edema  ____________________________________________   INITIAL IMPRESSION / ASSESSMENT AND PLAN / ED COURSE  Pertinent labs & imaging results that were available during my care of the patient were reviewed by me and considered in my medical decision making (see chart for details).  Patient presents for somnolence, shortness of breath and right leg pain. Patient has significant lower extremity edema bilaterally with tenderness to the right popliteal fossa. Patient has interstitial edema on his chest x-ray. Does not appear to be on any diuretics on his MAR. Patient lives at a group facility. VBG has resulted with a  PCO2 of 79 and a PO2 less than 31. Given the patient's right leg pain we'll proceed with an ultrasound to rule out DVT as well as a CT scan of the chest rule out PE. The patient's increased PCO2, interstitial edema on chest x-ray and somnolence we will start the patient on BiPAP, IV Lasix and continue to closely monitor. Patient desats to 87% on room air, no home O2 requirement. Patient will require admission to the hospital once his workup has completed. Troponin has been added on.  CT negative for PE shows possible multifocal pneumonia. We will check blood cultures, cover with Levaquin. Ultrasound negative for DVT. Patient remains hypercarbic after greater than one hour on BiPAP. We will continue BiPAP. Patient will be admitted to the ICU for further treatment.  CRITICAL CARE Performed by: Harvest Dark   Total critical care time: 45 minutes  Critical care time was exclusive of separately billable procedures and treating other patients.  Critical care was necessary to treat or prevent imminent or life-threatening  deterioration.  Critical care was time spent personally by me on the following activities: development of treatment plan with patient and/or surrogate as well as nursing, discussions with consultants, evaluation of patient's response to treatment, examination of patient, obtaining history from patient or surrogate, ordering and performing treatments and interventions, ordering and review of laboratory studies, ordering and review of radiographic studies, pulse oximetry and re-evaluation of patient's condition.   ____________________________________________   FINAL CLINICAL IMPRESSION(S) / ED DIAGNOSES  Dyspnea Pulmonary edema Multifocal pneumonia    Harvest Dark, MD 12/20/15 1609

## 2015-12-20 NOTE — ED Triage Notes (Signed)
Pt reports that he has been having rt knee pain about 2 weeks ago after sitting outside in the cold. Pt also reports that he has a cold with cough, and feels sob x 3 weeks. Pts sats in triage range from 87%-93%.

## 2015-12-20 NOTE — H&P (Signed)
PULMONARY / CRITICAL CARE MEDICINE   Name: John [name]. MRN: [id number] DOB: 1975/05/19    ADMISSION DATE:  12/20/2015 CONSULTATION DATE:  12/20/15  REFERRING MD: Dr.Paduchowski  CHIEF COMPLAINT: SOB  HISTORY OF PRESENT ILLNESS:   John [name] is a 40 year old male with a past medical history significant for asthma, diabetes mellitus, morbid obesity, paranoid schizophrenia, personality disorder, sleep apnea and thyroid disease. Patient presented to ED on 12/19 with difficulty in breathing, lethargy and right leg pain. Patient lives in a group home. Patient reports that he had cold, cough and shortness of breath for almost 3 weeks. O2 sats in the ED where 87-93%. CT of the chest was concerning for multifocal pneumonia. Patient was started on broad-spectrum antibiotics and was sent to the ICU. [facility] team was called to admit the patient.  PAST MEDICAL HISTORY :  He  has a past medical history of Anemia; Asthma; Diabetes mellitus; Morbid obesity ([place]); Paranoid schizophrenia ([place]); Personality disorder; Sleep apnea; and Thyroid disease.  PAST SURGICAL HISTORY: He  has a past surgical history that includes Fracture surgery.  No Known Allergies  No current facility-administered medications on file prior to encounter.    Current Outpatient Prescriptions on File Prior to Encounter  Medication Sig  . Armodafinil 250 MG tablet Take 250 mg by mouth daily.  Marland Kitchen atenolol (TENORMIN) 100 MG tablet Take 100 mg by mouth daily.  . clonazePAM (KLONOPIN) 0.5 MG tablet Take 0.5 mg by mouth 3 (three) times daily.  . clozapine (CLOZARIL) 200 MG tablet Take 1 tablet (200 mg total) by mouth daily. (Patient taking differently: Take 200 mg by mouth at bedtime. )  . divalproex (DEPAKOTE ER) 500 MG 24 hr tablet Take 2,000 mg by mouth at bedtime.   Marland Kitchen glipiZIDE (GLUCOTROL XL) 5 MG 24 hr tablet Take 5 mg by mouth 2 (two) times daily.  Marland Kitchen levothyroxine (SYNTHROID, LEVOTHROID) 75 MCG tablet Take 75 mcg by mouth  daily.  Marland Kitchen lithium carbonate (ESKALITH) 450 MG CR tablet Take 450 mg by mouth at bedtime.  Marland Kitchen loratadine (CLARITIN) 10 MG tablet Take 10 mg by mouth daily.  Marland Kitchen losartan (COZAAR) 50 MG tablet Take 1 tablet (50 mg total) by mouth daily.  . meloxicam (MOBIC) 7.5 MG tablet Take 7.5 mg by mouth daily.  . metFORMIN (GLUCOPHAGE) 1000 MG tablet Take 1,000 mg by mouth 2 (two) times daily with a meal.  . Multiple Vitamins-Minerals (MULTIVITAMINS THER. W/MINERALS) TABS tablet Take 1 tablet by mouth daily.  Marland Kitchen omeprazole (PRILOSEC) 20 MG capsule Take 20 mg by mouth daily.  Marland Kitchen oxybutynin (DITROPAN) 5 MG tablet Take 5 mg by mouth daily.  . simvastatin (ZOCOR) 20 MG tablet Take 20 mg by mouth at bedtime.   Marland Kitchen levofloxacin (LEVAQUIN) 750 MG tablet Take 1 tablet (750 mg total) by mouth daily.  . predniSONE (DELTASONE) 20 MG tablet Take 3 tablets (60 mg total) by mouth daily with breakfast.    FAMILY HISTORY:  His indicated that the status of his mother is unknown.    SOCIAL HISTORY: He  reports that he has been smoking.  He has never used smokeless tobacco. He reports that he drinks alcohol. He reports that he does not use drugs.  REVIEW OF SYSTEMS:   Unable to obtain as the patient is on BiPAP  SUBJECTIVE:  Unable to obtain as the patient is on BiPAP  VITAL SIGNS: BP 130/86   Pulse 73   Temp 97.2 F (36.2 C) (Oral)   Resp  12   Ht 6\' 2"  (1.88 m)   Wt (!) 154.2 kg (340 lb)   SpO2 100%   BMI 43.65 kg/m   HEMODYNAMICS:    VENTILATOR SETTINGS:    INTAKE / OUTPUT: I/O last 3 completed shifts: In: 250 [IV Piggyback:250] Out: 1300 [Urine:1300]  PHYSICAL EXAMINATION: General:  Morbidly obese, young gentleman, found on BiPAP, no acute distress Neuro: Awake, alert, oriented, follows commands, no focal deficits HEENT:  Atraumatic, normocephalic, no discharge, no JVD appreciated Cardiovascular:  S1 and S2, RRR, no MRG noted Lungs: Diminished bibasilar, no wheezes, crackles, rhonchi  noted Abdomen: Obese, soft, nontender, active bowel sounds Musculoskeletal:  No inflammation/deformity noted Skin:  Grossly intact  LABS:  BMET  Recent Labs Lab 12/20/15 1007  NA 140  K 4.0  CL 102  CO2 31  BUN 7  CREATININE 1.02  GLUCOSE 112*    Electrolytes  Recent Labs Lab 12/20/15 1007  CALCIUM 8.4*    CBC  Recent Labs Lab 12/20/15 1007  WBC 7.0  HGB 12.6*  HCT 39.2*  PLT 157    Coag's No results for input(s): APTT, INR in the last 168 hours.  Sepsis Markers  Recent Labs Lab 12/20/15 1007  PROCALCITON <0.10    ABG  Recent Labs Lab 12/20/15 1434  PHART 7.30*  PCO2ART 75*  PO2ART 85    Liver Enzymes No results for input(s): AST, ALT, ALKPHOS, BILITOT, ALBUMIN in the last 168 hours.  Cardiac Enzymes  Recent Labs Lab 12/20/15 1007  TROPONINI <0.03    Glucose  Recent Labs Lab 12/20/15 1719 12/20/15 1744 12/20/15 1812  GLUCAP 43* 53* 113*    Imaging Dg Chest 2 View  Result Date: 12/20/2015 CLINICAL DATA:  Illness of breath and wheezing for the past 3 weeks. History of morbid obesity, COPD an acute respiratory failure, cardiomyopathy, diabetes, current smoker. EXAM: CHEST  2 VIEW COMPARISON:  Portable chest x-ray of November 25, 2015 FINDINGS: The lungs are mildly hypoinflated. There is no focal infiltrate and no pleural effusion. The cardiac silhouette remains enlarged. The central pulmonary vascularity is prominent. The interstitial markings are mildly increased but not greatly changed. The bony thorax where visualized exhibits no acute abnormality. IMPRESSION: Cardiomegaly with mild interstitial edema. No alveolar pneumonia nor pleural effusion. Electronically Signed   By: David  Martinique M.D.   On: 12/20/2015 11:12   Ct Angio Chest Pe W And/or Wo Contrast  Result Date: 12/20/2015 CLINICAL DATA:  40 year old male with a history of cough and cold with shortness of breath for 3 weeks EXAM: CT ANGIOGRAPHY CHEST WITH CONTRAST  TECHNIQUE: Multidetector CT imaging of the chest was performed using the standard protocol during bolus administration of intravenous contrast. Multiplanar CT image reconstructions and MIPs were obtained to evaluate the vascular anatomy. CONTRAST:  100 cc Isovue 370 COMPARISON:  05/17/2014, 07/02/2013, 03/27/2013 FINDINGS: Cardiovascular: Heart: CT demonstration of cardiomegaly. Trace pericardial fluid/thickening, slightly increased from the prior. No coronary calcifications. Aorta: Heparin origin of the left subclavian artery. No aneurysm. No aortic aneurysm. No significant atherosclerotic calcifications. Pulmonary arteries: No central, lobar, segmental, or proximal subsegmental filling defects. Mediastinum/Nodes: Lobulated soft tissue of the anterior mediastinum is again demonstrated, unchanged from prior. Lymph nodes are present, with the largest in the left preaortic station measuring 9 mm. Left hilar lymph node measures 13 mm. Unremarkable appearance of the thoracic inlet and thyroid. Lungs/Pleura: Pattern of centrilobular and peribronchovascular nodularity of the right upper lobe and bilateral lower lobes. Small bilateral pleural effusions. Upper Abdomen: Diffusely decreased  attenuation of liver parenchyma evidence of hepatomegaly. Musculoskeletal: No displaced fracture. Degenerative changes of the spine. Review of the MIP images confirms the above findings. IMPRESSION: Negative for pulmonary emboli. Evidence of multifocal pneumonia, with peribronchovascular and centrilobular nodularity of predominantly right upper lobe and bilateral lower lobes. Likely reactive hilar and mediastinal lymph nodes. Bilateral parapneumonic effusion. Similar appearance of anterior mediastinal soft tissue, unchanged since the comparison studies of 2015. Steatosis and hepatomegaly. Signed, Dulcy Fanny. Earleen Newport, DO [facility] Electronically Signed   By: Corrie Mckusick D.O.   On:  12/20/2015 15:37   US Venous Img Lower Unilateral Right  Result Date: 12/20/2015 CLINICAL DATA:  Shortness of breath. Right lower extremity pain for the past 2 weeks. History of smoking. Evaluate for DVT. EXAM: RIGHT LOWER EXTREMITY VENOUS DOPPLER ULTRASOUND TECHNIQUE: Gray-scale sonography with graded compression, as well as color Doppler and duplex ultrasound were performed to evaluate the lower extremity deep venous systems from the level of the common femoral vein and including the common femoral, femoral, profunda femoral, popliteal and calf veins including the posterior tibial, peroneal and gastrocnemius veins when visible. The superficial great saphenous vein was also interrogated. Spectral Doppler was utilized to evaluate flow at rest and with distal augmentation maneuvers in the common femoral, femoral and popliteal veins. COMPARISON:  None. FINDINGS: Examination is degraded due to patient body habitus and poor sonographic window. Contralateral Common Femoral Vein: Respiratory phasicity is normal and symmetric with the symptomatic side. No evidence of thrombus. Normal compressibility. Common Femoral Vein: No evidence of thrombus. Normal compressibility, respiratory phasicity and response to augmentation. Saphenofemoral Junction: No evidence of thrombus. Normal compressibility and flow on color Doppler imaging. Profunda Femoral Vein: No evidence of thrombus. Normal compressibility and flow on color Doppler imaging. Femoral Vein: No evidence of thrombus. Normal compressibility, respiratory phasicity and response to augmentation. Popliteal Vein: No evidence of thrombus. Normal compressibility, respiratory phasicity and response to augmentation. Calf Veins: No evidence of thrombus. Normal compressibility and flow on color Doppler imaging. Superficial Great Saphenous Vein: No evidence of thrombus. Normal compressibility and flow on color Doppler imaging. Venous Reflux:  None. Other Findings:  None.  IMPRESSION: No evidence of DVT within the right lower extremity. Electronically Signed   By: Sandi Mariscal M.D.   On: 12/20/2015 12:57     STUDIES:  12/19 CT  Chest>>Negative for pulmonary emboli.Evidence of multifocal pneumonia,  1219 ultrasound of LE>>No evidence of DVT within the right lower extremity. 11/24 echo>>Left ventricle: Systolic function was mildly reduced. The  estimated ejection fraction was in the range of 45% to 50% CULTURES: 12/19 BC>> 12/19 [place]>>  ANTIBIOTICS: 12/19 vancomycin>> 12/19 Zosyn>>  SIGNIFICANT EVENTS: 12/19>> patient admitted to the ICU with multifocal pneumonia requiring BiPAP  LINES/TUBES: None   ASSESSMENT / PLAN:  PULMONARY A: Acute respiratory failure related to multifocal pneumonia Pulmonary edema History of COPD Obstructive sleep apnea HCAP P:   Continue BiPAP, wean as tolerated Keep O2 sats > 92% Bronchodilators Vancomycin/Zosyn Lasix 60 mg X 1-received Routine ABG  CARDIOVASCULAR A:  History of cardiomyopathy Hypertension Dyslipidemia P:  Continuous telemetry Continue Atenolol/losartan Continue simvastatin  RENAL A:   No active issues P:   Follow chemistry intermittently Replace electrolytes per ICU protocol  GASTROINTESTINAL A:   No active issues P:   Carb modified diet  HEMATOLOGIC A:   No active Issues P:  Lovenox for DVT prophylaxis  INFECTIOUS A:   HCAP P:   Monitor fever curve Follow CBC Follow  cultures PCT<0.10 ENDOCRINE A:   Diabetes mellitus type 2 Hypothyroidism P:   Carb modified diet Blood sugar checksACHS  With SSI Continue Synthroid  NEUROLOGIC A:   Hx of  paranoid schizophrenia/Personality disorder P:   Continue Depakote/lithium carbonate   FAMILY  - Updates:  No family present at bedside     Bincy Varughese,AG-ACNP [facility] [place]   12/20/2015, 7:36 PM

## 2015-12-20 NOTE — Progress Notes (Addendum)
Pharmacy Antibiotic Note  John [name]. is a 40 y.o. male admitted on 12/20/2015 with multifocal pneumonia.  Pharmacy has been consulted for Zosyn and vancomycin dosing to cover for possible HCAP.  Plan: Zosyn EI 4.5g IV Q8hr.    Patient's AdjBW: 111kg. Vancomycin 1500mg  IV Q8hr for goal trough of 15-20. Will obtain trough prior to 5th dose.    Height: 6\' 2"  (188 cm) Weight: (!) 340 lb (154.2 kg) IBW/kg (Calculated) : 82.2  Temp (24hrs), Avg:98.3 F (36.8 C), Min:98.3 F (36.8 C), Max:98.3 F (36.8 C)   Recent Labs Lab 12/20/15 1007  WBC 7.0  CREATININE 1.02    Estimated Creatinine Clearance: 154.2 mL/min (by C-G formula based on SCr of 1.02 mg/dL).    No Known Allergies  Antimicrobials this admission: Vancomycin 12/19 >>  Zosyn 12/19 >>   Dose adjustments this admission: N/A  Microbiology results: 12/19 BCx: pending  12/19 Sputum: pending  12/19 MRSA PCR: pending   Pharmacy consulted to monitor and adjust per consult.   Simpson,Michael L 12/20/2015 4:24 PM

## 2015-12-20 NOTE — Progress Notes (Signed)
Martinique loye RN informed that CO2 75

## 2015-12-20 NOTE — Progress Notes (Signed)
Dr. Stevenson Clinch notified of patient's low cbg. 2 amps of d50 given. Per Dr. Stevenson Clinch take patient off bipap and place order for carb modified diet since patient is no longer lethargic and alert and oriented x4. Order placed. No complaints, will continue to monitor. Wilnette Kales

## 2015-12-21 ENCOUNTER — Inpatient Hospital Stay: Payer: Medicare Other

## 2015-12-21 LAB — BLOOD GAS, ARTERIAL
ACID-BASE EXCESS: 9.3 mmol/L — AB (ref 0.0–2.0)
Acid-Base Excess: 10.1 mmol/L — ABNORMAL HIGH (ref 0.0–2.0)
Bicarbonate: 38 mmol/L — ABNORMAL HIGH (ref 20.0–28.0)
Bicarbonate: 39 mmol/L — ABNORMAL HIGH (ref 20.0–28.0)
DELIVERY SYSTEMS: POSITIVE
Delivery systems: POSITIVE
EXPIRATORY PAP: 6
Expiratory PAP: 6
FIO2: 0.25
FIO2: 0.25
INSPIRATORY PAP: 16
Inspiratory PAP: 16
MECHANICAL RATE: 12
Mechanical Rate: 16
O2 SAT: 92.2 %
O2 Saturation: 93.3 %
PCO2 ART: 74 mmHg — AB (ref 32.0–48.0)
PO2 ART: 69 mmHg — AB (ref 83.0–108.0)
Patient temperature: 37
Patient temperature: 37
pCO2 arterial: 72 mmHg (ref 32.0–48.0)
pH, Arterial: 7.33 — ABNORMAL LOW (ref 7.350–7.450)
pH, Arterial: 7.33 — ABNORMAL LOW (ref 7.350–7.450)
pO2, Arterial: 73 mmHg — ABNORMAL LOW (ref 83.0–108.0)

## 2015-12-21 LAB — BASIC METABOLIC PANEL
Anion gap: 6 (ref 5–15)
BUN: 8 mg/dL (ref 6–20)
CALCIUM: 8.6 mg/dL — AB (ref 8.9–10.3)
CO2: 38 mmol/L — ABNORMAL HIGH (ref 22–32)
CREATININE: 1.18 mg/dL (ref 0.61–1.24)
Chloride: 98 mmol/L — ABNORMAL LOW (ref 101–111)
Glucose, Bld: 92 mg/dL (ref 65–99)
Potassium: 4.1 mmol/L (ref 3.5–5.1)
SODIUM: 142 mmol/L (ref 135–145)

## 2015-12-21 LAB — CBC
HCT: 39.4 % — ABNORMAL LOW (ref 40.0–52.0)
HEMOGLOBIN: 12.5 g/dL — AB (ref 13.0–18.0)
MCH: 26.6 pg (ref 26.0–34.0)
MCHC: 31.7 g/dL — AB (ref 32.0–36.0)
MCV: 83.9 fL (ref 80.0–100.0)
PLATELETS: 158 10*3/uL (ref 150–440)
RBC: 4.7 MIL/uL (ref 4.40–5.90)
RDW: 16.5 % — AB (ref 11.5–14.5)
WBC: 6.3 10*3/uL (ref 3.8–10.6)

## 2015-12-21 LAB — GLUCOSE, CAPILLARY
GLUCOSE-CAPILLARY: 60 mg/dL — AB (ref 65–99)
GLUCOSE-CAPILLARY: 47 mg/dL — AB (ref 65–99)
GLUCOSE-CAPILLARY: 51 mg/dL — AB (ref 65–99)
GLUCOSE-CAPILLARY: 61 mg/dL — AB (ref 65–99)
GLUCOSE-CAPILLARY: 109 mg/dL — AB (ref 65–99)
GLUCOSE-CAPILLARY: 110 mg/dL — AB (ref 65–99)
Glucose-Capillary: 72 mg/dL (ref 65–99)
Glucose-Capillary: 80 mg/dL (ref 65–99)
Glucose-Capillary: 88 mg/dL (ref 65–99)
Glucose-Capillary: 69 mg/dL (ref 65–99)
Glucose-Capillary: 91 mg/dL (ref 65–99)
Glucose-Capillary: 93 mg/dL (ref 65–99)

## 2015-12-21 LAB — SEDIMENTATION RATE: Sed Rate: 15 mm/hr (ref 0–15)

## 2015-12-21 LAB — C-REACTIVE PROTEIN: CRP: 3.5 mg/dL — ABNORMAL HIGH (ref <1.0)

## 2015-12-21 LAB — PHOSPHORUS: Phosphorus: 4.6 mg/dL (ref 2.5–4.6)

## 2015-12-21 LAB — MAGNESIUM: Magnesium: 1.9 mg/dL (ref 1.7–2.4)

## 2015-12-21 MED ORDER — CEFTRIAXONE SODIUM-DEXTROSE 2-2.22 GM-% IV SOLR
2.0000 g | INTRAVENOUS | Status: DC
  Administered 2015-12-21 – 2015-12-23 (×3): 2 g via INTRAVENOUS
  Filled 2015-12-21 (×7): qty 50

## 2015-12-21 MED ORDER — DEXTROSE 5 % IV SOLN
2.0000 g | INTRAVENOUS | Status: DC
  Filled 2015-12-21: qty 2

## 2015-12-21 MED ORDER — VANCOMYCIN HCL 10 G IV SOLR
1500.0000 mg | Freq: Three times a day (TID) | INTRAVENOUS | Status: DC
  Administered 2015-12-21 (×2): 1500 mg via INTRAVENOUS
  Filled 2015-12-21 (×4): qty 1500

## 2015-12-21 MED ORDER — FUROSEMIDE 10 MG/ML IJ SOLN
60.0000 mg | Freq: Once | INTRAMUSCULAR | Status: AC
  Administered 2015-12-21: 60 mg via INTRAVENOUS
  Filled 2015-12-21: qty 6

## 2015-12-21 NOTE — Progress Notes (Signed)
Patient is ICU transfer from [facility]   Admitted with acute hypoxic respiratory failure was on BiPAP short time. EF is 40%. He received some Lasix. Now weaned off BiPAP.  -Right knee swelling with pain. Dr. Marry Guan from or the following  -Diabetes with low sugars.

## 2015-12-21 NOTE — Progress Notes (Signed)
Report given to Lovena Le, RN on 2a. Patient transferred to rm [id number]. Patient placed on telemetry, RN present. Box verified. CCMD and Elink notified. Wilnette Kales

## 2015-12-21 NOTE — Progress Notes (Signed)
Chaplain rounded the unit to provide a compassionate presence and support to the patient.  Patient sted that he felt better today than yesterday. Minerva Fester [phone]

## 2015-12-21 NOTE — Progress Notes (Signed)
Pharmacy Antibiotic Note  John Pink. is a 40 y.o. male admitted on 12/20/2015 with multifocal pneumonia.  Pharmacy has been consulted for ceftriaxone and vancomycin dosing to cover for possible septic arthritis.  Plan: Ceftriaxone 2g IV Q24hr.   Patient's AdjBW: 111kg. Vancomycin 1500mg  IV Q8hr for goal trough of 15-20. Will obtain trough prior to 5th dose.    Height: 6\' 2"  (188 cm) Weight: (!) 340 lb (154.2 kg) IBW/kg (Calculated) : 82.2  Temp (24hrs), Avg:97.8 F (36.6 C), Min:97.2 F (36.2 C), Max:98.4 F (36.9 C)   Recent Labs Lab 12/20/15 1007 12/21/15 0555  WBC 7.0 6.3  CREATININE 1.02 1.18    Estimated Creatinine Clearance: 133.3 mL/min (by C-G formula based on SCr of 1.18 mg/dL).    No Known Allergies  Antimicrobials this admission: Vancomycin 12/19 >>  Zosyn 12/19 >> 12/20 Ceftriaxone 12/20 >>  Dose adjustments this admission: N/A  Microbiology results: 12/19 BCx: no growth < 12 hours  12/19 Sputum: pending  12/19 MRSA PCR: negative   Pharmacy consulted to monitor and adjust per consult.   [name] 12/21/2015 3:27 PM

## 2015-12-21 NOTE — Consult Note (Signed)
ORTHOPAEDIC CONSULTATION  PATIENT NAME: John [name]. DOB: 1975/10/18  MRN: [id number]  REQUESTING PHYSICIAN: Flora Lipps, MD  Chief Complaint: Right knee pain  HPI: John [name]. is a 40 y.o. male who complains of  a 2 week history of right knee pain. He apparently had some pre-existing right knee pain and received an injection to the knee for 6 weeks ago as per Dr. Rebecka Apley. Proximally 2 weeks ago he states that his bed shifter broke and he had a slight fall, twisting the right knee. He has been ambulatory since that time. He reports pain primarily along the anterior aspect of the knee. He also reports discomfort with attempts at deep flexion of the knee. He denies any significant swelling, locking,  giving way, erythema, or increased warmth to the knee. He denies any fevers or chills. He denies any history of gout.  Past Medical History:  Diagnosis Date  . Anemia   . Asthma   . Diabetes mellitus   . Morbid obesity ([place])   . Paranoid schizophrenia ([place])   . Personality disorder   . Sleep apnea   . Thyroid disease    Past Surgical History:  Procedure Laterality Date  . FRACTURE SURGERY     Social History   Social History  . Marital status: Single    Spouse name: N/A  . Number of children: N/A  . Years of education: N/A   Social History Main Topics  . Smoking status: Light Tobacco Smoker  . Smokeless tobacco: Never Used  . Alcohol use Yes     Comment: only once in a while  . Drug use: No  . Sexual activity: Not Asked   Other Topics Concern  . None   Social History Narrative  . None   Family History  Problem Relation Age of Onset  . Glaucoma Mother    No Known Allergies Prior to Admission medications   Medication Sig Start Date End Date Taking? Authorizing Provider  Armodafinil 250 MG tablet Take 250 mg by mouth daily.   Yes Historical Provider, MD  atenolol (TENORMIN) 100 MG tablet Take 100 mg by mouth daily.   Yes Historical Provider, MD   budesonide-formoterol (SYMBICORT) 160-4.5 MCG/ACT inhaler Inhale 1 puff into the lungs 2 (two) times daily.   Yes Historical Provider, MD  clobetasol cream (TEMOVATE) [id number] % Apply 1 application topically 2 (two) times daily.   Yes Historical Provider, MD  clonazePAM (KLONOPIN) 0.5 MG tablet Take 0.5 mg by mouth 3 (three) times daily.   Yes Historical Provider, MD  clozapine (CLOZARIL) 200 MG tablet Take 1 tablet (200 mg total) by mouth daily. Patient taking differently: Take 200 mg by mouth at bedtime.  11/28/15  Yes Robbie Lis, MD  divalproex (DEPAKOTE ER) 500 MG 24 hr tablet Take 2,000 mg by mouth at bedtime.    Yes Historical Provider, MD  fluocinolone (SYNALAR) 0.01 % external solution Apply 1 mL topically 2 (two) times daily. Apply to scalp, neck and ears.   Yes Historical Provider, MD  glipiZIDE (GLUCOTROL XL) 5 MG 24 hr tablet Take 5 mg by mouth 2 (two) times daily.   Yes Historical Provider, MD  latanoprost (XALATAN) 0.005 % ophthalmic solution Place 1 drop into both eyes at bedtime.   Yes Historical Provider, MD  levothyroxine (SYNTHROID, LEVOTHROID) 75 MCG tablet Take 75 mcg by mouth daily.   Yes Historical Provider, MD  lithium carbonate (ESKALITH) 450 MG CR tablet Take 450 mg by mouth at  bedtime.   Yes Historical Provider, MD  loratadine (CLARITIN) 10 MG tablet Take 10 mg by mouth daily.   Yes Historical Provider, MD  losartan (COZAAR) 50 MG tablet Take 1 tablet (50 mg total) by mouth daily. 11/28/15  Yes Robbie Lis, MD  meloxicam (MOBIC) 7.5 MG tablet Take 7.5 mg by mouth daily.   Yes Historical Provider, MD  metFORMIN (GLUCOPHAGE) 1000 MG tablet Take 1,000 mg by mouth 2 (two) times daily with a meal.   Yes Historical Provider, MD  Multiple Vitamins-Minerals (MULTIVITAMINS THER. W/MINERALS) TABS tablet Take 1 tablet by mouth daily.   Yes Historical Provider, MD  omeprazole (PRILOSEC) 20 MG capsule Take 20 mg by mouth daily.   Yes Historical Provider, MD  oxybutynin (DITROPAN) 5  MG tablet Take 5 mg by mouth daily.   Yes Historical Provider, MD  simvastatin (ZOCOR) 20 MG tablet Take 20 mg by mouth at bedtime.    Yes Historical Provider, MD  triamcinolone cream (KENALOG) 0.1 % Apply 1 application topically 2 (two) times daily.   Yes Historical Provider, MD  levofloxacin (LEVAQUIN) 750 MG tablet Take 1 tablet (750 mg total) by mouth daily. 11/28/15   Robbie Lis, MD  predniSONE (DELTASONE) 20 MG tablet Take 3 tablets (60 mg total) by mouth daily with breakfast. 11/29/15   Robbie Lis, MD   Dg Chest 1 View  Result Date: 12/21/2015 CLINICAL DATA:  40 year old male with acute respiratory failure EXAM: CHEST 1 VIEW COMPARISON:  Prior chest x-ray 12/20/2015 FINDINGS: Stable cardiomegaly. Mild pulmonary vascular congestion without overt edema. Slightly increased right basilar opacity favored to reflect atelectasis. Otherwise, no new focal airspace opacity, large effusion or pneumothorax. Osseous structures are intact and unremarkable. IMPRESSION: 1. Slightly increased right basilar airspace opacity is nonspecific but favored to reflect atelectasis. Early infiltrate would be difficult to exclude radiographically. 2. Stable cardiomegaly without pulmonary edema. Electronically Signed   By: Jacqulynn Cadet M.D.   On: 12/21/2015 08:35   Dg Chest 2 View  Result Date: 12/20/2015 CLINICAL DATA:  Illness of breath and wheezing for the past 3 weeks. History of morbid obesity, COPD an acute respiratory failure, cardiomyopathy, diabetes, current smoker. EXAM: CHEST  2 VIEW COMPARISON:  Portable chest x-ray of November 25, 2015 FINDINGS: The lungs are mildly hypoinflated. There is no focal infiltrate and no pleural effusion. The cardiac silhouette remains enlarged. The central pulmonary vascularity is prominent. The interstitial markings are mildly increased but not greatly changed. The bony thorax where visualized exhibits no acute abnormality. IMPRESSION: Cardiomegaly with mild interstitial  edema. No alveolar pneumonia nor pleural effusion. Electronically Signed   By: David  Martinique M.D.   On: 12/20/2015 11:12   Ct Angio Chest Pe W And/or Wo Contrast  Result Date: 12/20/2015 CLINICAL DATA:  40 year old male with a history of cough and cold with shortness of breath for 3 weeks EXAM: CT ANGIOGRAPHY CHEST WITH CONTRAST TECHNIQUE: Multidetector CT imaging of the chest was performed using the standard protocol during bolus administration of intravenous contrast. Multiplanar CT image reconstructions and MIPs were obtained to evaluate the vascular anatomy. CONTRAST:  100 cc Isovue 370 COMPARISON:  05/17/2014, 07/02/2013, 03/27/2013 FINDINGS: Cardiovascular: Heart: CT demonstration of cardiomegaly. Trace pericardial fluid/thickening, slightly increased from the prior. No coronary calcifications. Aorta: Heparin origin of the left subclavian artery. No aneurysm. No aortic aneurysm. No significant atherosclerotic calcifications. Pulmonary arteries: No central, lobar, segmental, or proximal subsegmental filling defects. Mediastinum/Nodes: Lobulated soft tissue of the anterior mediastinum is again demonstrated,  unchanged from prior. Lymph nodes are present, with the largest in the left preaortic station measuring 9 mm. Left hilar lymph node measures 13 mm. Unremarkable appearance of the thoracic inlet and thyroid. Lungs/Pleura: Pattern of centrilobular and peribronchovascular nodularity of the right upper lobe and bilateral lower lobes. Small bilateral pleural effusions. Upper Abdomen: Diffusely decreased attenuation of liver parenchyma evidence of hepatomegaly. Musculoskeletal: No displaced fracture. Degenerative changes of the spine. Review of the MIP images confirms the above findings. IMPRESSION: Negative for pulmonary emboli. Evidence of multifocal pneumonia, with peribronchovascular and centrilobular nodularity of predominantly right upper lobe and bilateral lower lobes. Likely reactive hilar and  mediastinal lymph nodes. Bilateral parapneumonic effusion. Similar appearance of anterior mediastinal soft tissue, unchanged since the comparison studies of 2015. Steatosis and hepatomegaly. Signed, Dulcy Fanny. Earleen Newport, DO [facility] Radiology Electronically Signed   By: Corrie Mckusick D.O.   On: 12/20/2015 15:37   US Venous Img Lower Unilateral Right  Result Date: 12/20/2015 CLINICAL DATA:  Shortness of breath. Right lower extremity pain for the past 2 weeks. History of smoking. Evaluate for DVT. EXAM: RIGHT LOWER EXTREMITY VENOUS DOPPLER ULTRASOUND TECHNIQUE: Gray-scale sonography with graded compression, as well as color Doppler and duplex ultrasound were performed to evaluate the lower extremity deep venous systems from the level of the common femoral vein and including the common femoral, femoral, profunda femoral, popliteal and calf veins including the posterior tibial, peroneal and gastrocnemius veins when visible. The superficial great saphenous vein was also interrogated. Spectral Doppler was utilized to evaluate flow at rest and with distal augmentation maneuvers in the common femoral, femoral and popliteal veins. COMPARISON:  None. FINDINGS: Examination is degraded due to patient body habitus and poor sonographic window. Contralateral Common Femoral Vein: Respiratory phasicity is normal and symmetric with the symptomatic side. No evidence of thrombus. Normal compressibility. Common Femoral Vein: No evidence of thrombus. Normal compressibility, respiratory phasicity and response to augmentation. Saphenofemoral Junction: No evidence of thrombus. Normal compressibility and flow on color Doppler imaging. Profunda Femoral Vein: No evidence of thrombus. Normal compressibility and flow on color Doppler imaging. Femoral Vein: No evidence of thrombus. Normal compressibility, respiratory phasicity and response to augmentation. Popliteal Vein: No evidence of  thrombus. Normal compressibility, respiratory phasicity and response to augmentation. Calf Veins: No evidence of thrombus. Normal compressibility and flow on color Doppler imaging. Superficial Great Saphenous Vein: No evidence of thrombus. Normal compressibility and flow on color Doppler imaging. Venous Reflux:  None. Other Findings:  None. IMPRESSION: No evidence of DVT within the right lower extremity. Electronically Signed   By: Sandi Mariscal M.D.   On: 12/20/2015 12:57   Dg Knee Right Port  Result Date: 12/21/2015 CLINICAL DATA:  Right knee pain.  No known injury. EXAM: PORTABLE RIGHT KNEE - 1-2 VIEW COMPARISON:  Plain films right knee 06/07/2012. FINDINGS: No acute bony or joint abnormality is identified. Small osteophytes about the medial compartment are noted. There is mild spurring off the inferior pole of the patella at the patellar tendon attachment. Bipartite patella is again seen. IMPRESSION: No acute abnormality. Mild medial compartment degenerative change. Bipartite patella. Electronically Signed   By: Inge Rise M.D.   On: 12/21/2015 14:53    Positive ROS: All other systems have been reviewed and were otherwise negative with the exception of those mentioned in the HPI and as above.  Physical Exam: General: Alert and alert in no acute distress. Obese. HEENT: Atraumatic and normocephalic. Sclera are clear. Extraocular motion is  intact. Oropharynx is clear with moist mucosa. Neck: Supple, nontender, good range of motion. No JVD or carotid bruits. Lungs: Clear to auscultation bilaterally. Cardiovascular: Regular rate and rhythm with normal S1 and S2. No murmurs. No gallops or rubs. Pedal pulses are palpable bilaterally. Homans test is negative bilaterally. No significant pretibial or ankle edema. Abdomen: Soft, nontender, and nondistended. Bowel sounds are present. Skin: No lesions in the area of chief complaint Neurologic: Awake, alert, and oriented. Sensory function is grossly  intact. Motor strength is felt to be 5 over 5 bilaterally. No clonus or tremor. Good motor coordination. Lymphatic: No axillary or cervical lymphadenopathy  MUSCULOSKELETAL: Examination of the right knee shows no significant increased warmth or erythema. There is tenderness to palpation along the inferior and superior poles of the patella. No knee effusion is present. There is no medial or lateral joint line tenderness. The knee is stable to varus and valgus stress. Lachman's test is negative. He does demonstrate some guarding with attempts at flexion of the knee, primarily complaining of anterior knee pain.  Assessment: Right patellar tendinosis Right bipartite patella  Plan: The findings were discussed with the patient. I do not suspect a septic joint based on his clinical examination as well as a normal white count and normal temperature profile since admission. I would recommend use of cold therapy to the knee. Consider physical therapy for modalities and work on mobilizing the patient when medically stable.  James P. Holley Bouche M.D.

## 2015-12-21 NOTE — Progress Notes (Signed)
Case Discussed with Dr Fritzi Mandes. Will transfer care to hospitalist service on 12/21

## 2015-12-21 NOTE — Progress Notes (Signed)
Pt transferred to room from ICU. Oriented to room, phone, call bell and staff. Tele box called in with Truitt Leep RN. Patient in no distress. Will continue to monitor.
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
## 2015-12-21 NOTE — Progress Notes (Addendum)
Notified MD this am that cbg continues to be less than 70, per Dr. Mortimer Fries place order for q2h cbg. Order placed. Hinton Dyer, NP gave order for patient to move off floor with no telemetry, med surg. Order placed. Wilnette Kales   ** 5:55 PM Order changed for patient to go to telemetry, see new order from [place], NP.

## 2015-12-22 LAB — BLOOD GAS, ARTERIAL
ACID-BASE EXCESS: 10.6 mmol/L — AB (ref 0.0–2.0)
BICARBONATE: 38.1 mmol/L — AB (ref 20.0–28.0)
FIO2: 0.21
O2 SAT: 89.4 %
PATIENT TEMPERATURE: 37
PO2 ART: 58 mmHg — AB (ref 83.0–108.0)
pCO2 arterial: 63 mmHg — ABNORMAL HIGH (ref 32.0–48.0)
pH, Arterial: 7.39 (ref 7.350–7.450)

## 2015-12-22 LAB — GLUCOSE, CAPILLARY
GLUCOSE-CAPILLARY: 125 mg/dL — AB (ref 65–99)
GLUCOSE-CAPILLARY: 140 mg/dL — AB (ref 65–99)
GLUCOSE-CAPILLARY: 143 mg/dL — AB (ref 65–99)
GLUCOSE-CAPILLARY: 152 mg/dL — AB (ref 65–99)
GLUCOSE-CAPILLARY: 152 mg/dL — AB (ref 65–99)
GLUCOSE-CAPILLARY: 96 mg/dL (ref 65–99)
Glucose-Capillary: 115 mg/dL — ABNORMAL HIGH (ref 65–99)
Glucose-Capillary: 121 mg/dL — ABNORMAL HIGH (ref 65–99)
Glucose-Capillary: 136 mg/dL — ABNORMAL HIGH (ref 65–99)
Glucose-Capillary: 89 mg/dL (ref 65–99)

## 2015-12-22 LAB — VANCOMYCIN, TROUGH: VANCOMYCIN TR: 20 ug/mL (ref 15–20)

## 2015-12-22 MED ORDER — INSULIN ASPART 100 UNIT/ML ~~LOC~~ SOLN
0.0000 [IU] | Freq: Every day | SUBCUTANEOUS | Status: DC
  Administered 2015-12-23: 0 [IU] via SUBCUTANEOUS

## 2015-12-22 MED ORDER — ARMODAFINIL 250 MG PO TABS
250.0000 mg | ORAL_TABLET | Freq: Every morning | ORAL | Status: DC
  Filled 2015-12-22 (×2): qty 1

## 2015-12-22 MED ORDER — CLONAZEPAM 0.5 MG PO TABS
0.5000 mg | ORAL_TABLET | Freq: Two times a day (BID) | ORAL | Status: DC
  Administered 2015-12-22: 0.5 mg via ORAL
  Filled 2015-12-22: qty 1

## 2015-12-22 MED ORDER — VANCOMYCIN HCL 10 G IV SOLR
1250.0000 mg | Freq: Three times a day (TID) | INTRAVENOUS | Status: DC
  Administered 2015-12-22 (×2): 1250 mg via INTRAVENOUS
  Filled 2015-12-22 (×4): qty 1250

## 2015-12-22 MED ORDER — INSULIN ASPART 100 UNIT/ML ~~LOC~~ SOLN
0.0000 [IU] | Freq: Three times a day (TID) | SUBCUTANEOUS | Status: DC
  Administered 2015-12-22 – 2015-12-25 (×2): 1 [IU] via SUBCUTANEOUS
  Administered 2015-12-26: 2 [IU] via SUBCUTANEOUS
  Administered 2015-12-27 (×2): 1 [IU] via SUBCUTANEOUS
  Filled 2015-12-22 (×3): qty 1
  Filled 2015-12-22: qty 2

## 2015-12-22 NOTE — Progress Notes (Signed)
PT Cancellation Note  Patient Details Name: John [name]. MRN: [id number] DOB: Aug 17, 1975   Cancelled Treatment:    Reason Eval/Treat Not Completed: Patient not medically ready. Consult received and chart reviewed. Pt just recently transferred to CCU at 1515 for respiratory failure and is now on bipap. Not medically stable for PT eval at this time. Order placed prior to transfer to CCU. Will need new orders prior to initiation of therapy. Plan to wait until tomorrow to assess. If no orders received, will cancel current order.   Hampton Wixom 12/22/2015, 3:28 PM  [name], PT, DPT [phone]

## 2015-12-22 NOTE — Progress Notes (Signed)
   [facility] NAME: John [name]    MR#:  [id number]  DATE OF BIRTH:  05/29/1975  SUBJECTIVE:  CHIEF COMPLAINT:   Chief Complaint  Patient presents with  . Knee Pain  . Shortness of Breath   The patient is lethargic and drowsy, hard to arouse.  REVIEW OF SYSTEMS:  Review of Systems  Unable to perform ROS: Mental acuity    DRUG ALLERGIES:  No Known Allergies VITALS:  Blood pressure 131/68, pulse 97, temperature 99.1 F (37.3 C), temperature source Oral, resp. rate 18, height 6\' 2"  (1.88 m), weight (!) 338 lb 12.8 oz (153.7 kg), SpO2 97 %. PHYSICAL EXAMINATION:  Physical Exam  Constitutional:  obesity  HENT:  Head: Normocephalic.  Eyes: Conjunctivae and EOM are normal.  Neck: Normal range of motion. Neck supple. No JVD present. No tracheal deviation present.  Cardiovascular: Normal rate, regular rhythm and normal heart sounds.  Exam reveals no gallop.   No murmur heard. Pulmonary/Chest: Effort normal. No respiratory distress. He has no wheezes. He has no rales.  Limited air entry with diminished breath sounds  Abdominal: Soft. Bowel sounds are normal. He exhibits no distension. There is no tenderness.  Musculoskeletal: He exhibits no edema or tenderness.  Neurological:  Lethargic and drowsy.  Skin: No rash noted. No erythema.   LABORATORY PANEL:   CBC  Recent Labs Lab 12/21/15 0555  WBC 6.3  HGB 12.5*  HCT 39.4*  PLT 158   ------------------------------------------------------------------------------------------------------------------ Chemistries   Recent Labs Lab 12/21/15 0555  NA 142  K 4.1  CL 98*  CO2 38*  GLUCOSE 92  BUN 8  CREATININE 1.18  CALCIUM 8.6*  MG 1.9   RADIOLOGY:  No results found. ASSESSMENT AND PLAN:   Acute respiratory failure with hypoxia and hypercapnia. ABG: PO2 58, PCO2 63. Put on BIPAP, transfer to stepdown. NEB.  Multifocal pneumonia (HAP) Continue rocephin,  discontinued vanco and zosyn.  COPD and OSA.  As above.  HTN. Controlled. Continue Atenolol/losartan  DM2, SL ACHS.   paranoid schizophrenia/Personality disorder Continue Depakote/lithium carbonate, taper klonopin.  All the records are reviewed and case discussed with Care Management/Social Worker. Management plans discussed with the patient, family and they are in agreement.  CODE STATUS: full code.  TOTAL cRITICAL TIME TAKING CARE OF THIS PATIENT: 46 minutes.   More than 50% of the time was spent in counseling/coordination of care: YES  POSSIBLE D/C IN 3 DAYS, DEPENDING ON CLINICAL CONDITION.   Demetrios Loll M.D on 12/22/2015 at 2:26 PM  Between 7am to 6pm - Pager - [phone]  After 6pm go to [email or web address] - Proofreader  [facility] [place] [facility]  Office  [phone]  CC: Primary care physician; Cletis Athens, MD  Note: This dictation was prepared with Dragon dictation along with smaller phrase technology. Any transcriptional errors that result from this process are unintentional.

## 2015-12-22 NOTE — Progress Notes (Signed)
ORTHOPAEDICS PROGRESS NOTE  PATIENT NAME: John [name]. DOB: Dec 12, 1975  MRN: [id number]  Subjective: The patient is sleeping soundly with audible snoring. Difficult to arouse. BiPAP was not in place.  Objective: Vital signs in last 24 hours: Temp:  [97.9 F (36.6 C)-98.5 F (36.9 C)] 97.9 F (36.6 C) (12/21 0405) Pulse Rate:  [65-102] 88 (12/21 0405) Resp:  [12-25] 18 (12/21 0405) BP: (111-165)/(63-141) 111/63 (12/21 0405) SpO2:  [94 %-100 %] 100 % (12/21 0405) FiO2 (%):  [25 %] 25 % (12/20 2323) Weight:  [153.7 kg (338 lb 12.8 oz)] 153.7 kg (338 lb 12.8 oz) (12/21 0405)  Intake/Output from previous day: 12/20 0701 - 12/21 0700 In: 990 [P.O.:240; IV Piggyback:750] Out: 1390 [Urine:1390]   Recent Labs  12/20/15 1007 12/21/15 0555  WBC 7.0 6.3  HGB 12.6* 12.5*  HCT 39.2* 39.4*  PLT 157 158  K 4.0 4.1  CL 102 98*  CO2 31 38*  BUN 7 8  CREATININE 1.02 1.18  GLUCOSE 112* 92  CALCIUM 8.4* 8.6*    EXAM General: Well-developed well-nourished obese male in no distress. Right lower extremity: No knee effusion. No tenderness to palpation elicited. No soft tissue swelling.  Assessment: Right knee pain, probable right patellar tendinosis.  Active Problems:   Respiratory failure ([place])  Plan: Ice pack was not in place to the knee despite orders placed yesterday. I recharged the ice pack that was on the counter and applied to the right knee. The patient was sleeping soundly and did not demonstrate any discomfort despite my examination of the right knee. Continue with cold therapy as needed. Consider physical therapy consult to mobilize the patient.  Tilmon Wisehart P. Holley Bouche M.D.

## 2015-12-22 NOTE — Progress Notes (Signed)
Telephone report called to Judson Roch, Therapist, sports.  Patient to be moved to ICU bed 6.  Mother called and notified of transfer.

## 2015-12-22 NOTE — Progress Notes (Signed)
Pharmacy Antibiotic Note  John [name]. is a 40 y.o. male admitted on 12/20/2015 with multifocal pneumonia.  Pharmacy has been consulted for ceftriaxone and vancomycin dosing to cover for possible septic arthritis.  Plan: Ceftriaxone 2g IV Q24hr.   Patient's AdjBW: 111kg. Vancomycin 1500mg  IV Q8hr for goal trough of 15-20. Will obtain trough prior to 5th dose.    12/21 0126 VT 20 mcg/mL. Recalculated Ke 0.084 hr-1. Reduce to vancomycin 1.25 gm IV Q8H, predicted trough 17 mcg/mL. Pharmacy will continue to follow and adjust as needed to maintain trough 15 to 20 mcg/mL.   Height: 6\' 2"  (188 cm) Weight: (!) 340 lb (154.2 kg) IBW/kg (Calculated) : 82.2  Temp (24hrs), Avg:98.1 F (36.7 C), Min:97.9 F (36.6 C), Max:98.5 F (36.9 C)   Recent Labs Lab 12/20/15 1007 12/21/15 0555 12/22/15 0126  WBC 7.0 6.3  --   CREATININE 1.02 1.18  --   VANCOTROUGH  --   --  20    Estimated Creatinine Clearance: 133.3 mL/min (by C-G formula based on SCr of 1.18 mg/dL).    No Known Allergies  Antimicrobials this admission: Vancomycin 12/19 >>  Zosyn 12/19 >> 12/20 Ceftriaxone 12/20 >>  Dose adjustments this admission: N/A  Microbiology results: 12/19 BCx: no growth < 12 hours  12/19 Sputum: pending  12/19 MRSA PCR: negative   Pharmacy consulted to monitor and adjust per consult.   Laural Benes, Pharm.D., BCPS Clinical Pharmacist 12/22/2015 2:10 AM

## 2015-12-22 NOTE — Progress Notes (Signed)
Patient is very sleepy but easily aroused and answers questions appropriately. Patient on scheduled clonopin.  Dr. Bridgett Larsson notified of concerns.  Order to be placed for ABG and need for clonopin to be assessed.

## 2015-12-22 NOTE — Care Management (Signed)
Patient transfer from icu to 2A.  had required icu stay due to need for continuous bipap due to pneumonia.  Being followed by ortho for right knee pain due to patellar tendonosis.  He is from a family care/ group home ([facility])  and csw is following.  Spoke with Flo Shanks [phone], the group home owner and informed that patient does have a cpap that he uses at night but is not on chronic oxygen.  He is agreeable to home health services and agency preference would be [facility].  Patient will need to be assessed for home 02 and reached out to attending for physical therapy consult.  heads up referral to [facility] for PT and nursing .

## 2015-12-22 NOTE — Progress Notes (Signed)
Case Discussed with Dr Fritzi Mandes. Will transfer care to hospitalist service on 12/21 Patient much improved on biPAP dx of acute systolic CHF  Will sign off please call back with any questions    Corrin Parker, M.D.  [facility]  Medical Director [place] Director [facility] Cardio-Pulmonary Department

## 2015-12-22 NOTE — Progress Notes (Signed)
Patient placed back on BiPap per MD request due to increased CO2 and somnolence.

## 2015-12-23 LAB — GLUCOSE, CAPILLARY
GLUCOSE-CAPILLARY: 107 mg/dL — AB (ref 65–99)
GLUCOSE-CAPILLARY: 93 mg/dL (ref 65–99)
Glucose-Capillary: 89 mg/dL (ref 65–99)
Glucose-Capillary: 119 mg/dL — ABNORMAL HIGH (ref 65–99)

## 2015-12-23 MED ORDER — MODAFINIL 100 MG PO TABS
200.0000 mg | ORAL_TABLET | Freq: Every day | ORAL | Status: DC
  Administered 2015-12-23 – 2015-12-28 (×6): 200 mg via ORAL
  Filled 2015-12-23 (×6): qty 2

## 2015-12-23 NOTE — Progress Notes (Signed)
   [place] [facility] [place] NAME: John [name]    MR#:  [id number]  DATE OF BIRTH:  12/09/1975  SUBJECTIVE:  CHIEF COMPLAINT:   Chief Complaint  Patient presents with  . Knee Pain  . Shortness of Breath   The patient is lethargic and drowsy, hard to arouse. Off BIPAP, on O2 [place] 2 L. REVIEW OF SYSTEMS:  Review of Systems  Unable to perform ROS: Mental acuity    DRUG ALLERGIES:  No Known Allergies VITALS:  Blood pressure 121/81, pulse 83, temperature 98.1 F (36.7 C), temperature source Axillary, resp. rate 13, height 6\' 2"  (1.88 m), weight (!) 348 lb 12.3 oz (158.2 kg), SpO2 (!) 89 %. PHYSICAL EXAMINATION:  Physical Exam  Constitutional:  obesity  HENT:  Head: Normocephalic.  Eyes: Conjunctivae and EOM are normal.  Neck: Normal range of motion. Neck supple. No JVD present. No tracheal deviation present.  Cardiovascular: Normal rate, regular rhythm and normal heart sounds.  Exam reveals no gallop.   No murmur heard. Pulmonary/Chest: Effort normal. No respiratory distress. He has no wheezes. He has no rales.  Limited air entry with diminished breath sounds  Abdominal: Soft. Bowel sounds are normal. He exhibits no distension. There is no tenderness.  Musculoskeletal: He exhibits no edema or tenderness.  Neurological:  Lethargic and drowsy.  Skin: No rash noted. No erythema.   LABORATORY PANEL:   CBC  Recent Labs Lab 12/21/15 0555  WBC 6.3  HGB 12.5*  HCT 39.4*  PLT 158   ------------------------------------------------------------------------------------------------------------------ Chemistries   Recent Labs Lab 12/21/15 0555  NA 142  K 4.1  CL 98*  CO2 38*  GLUCOSE 92  BUN 8  CREATININE 1.18  CALCIUM 8.6*  MG 1.9   RADIOLOGY:  No results found. ASSESSMENT AND PLAN:   Acute respiratory failure with hypoxia and hypercapnia. ABG: PO2 58, PCO2 63. Off BIPAP, on O2 [place] 2L, continue NEB.  Multifocal pneumonia  (HAP) Continue rocephin, discontinued vanco and zosyn.  COPD and OSA.  As above.  HTN. Controlled. Continue Atenolol/losartan  DM2, SL ACHS.   paranoid schizophrenia/Personality disorder Continue Depakote/lithium carbonate, taper klonopin.  All the records are reviewed and case discussed with Care Management/Social Worker. Management plans discussed with the patient, family and they are in agreement.  CODE STATUS: full code.  TOTAL TIME TAKING CARE OF THIS PATIENT: 37 minutes.   More than 50% of the time was spent in counseling/coordination of care: YES  POSSIBLE D/C IN 2-3 DAYS, DEPENDING ON CLINICAL CONDITION.   Demetrios Loll M.D on 12/23/2015 at 4:00 PM  Between 7am to 6pm - Pager - [phone]  After 6pm go to [email or web address] - Proofreader  [facility]  Office  [phone]  CC: Primary care physician; Cletis Athens, MD  Note: This dictation was prepared with Dragon dictation along with smaller phrase technology. Any transcriptional errors that result from this process are unintentional.

## 2015-12-23 NOTE — Clinical Social Work Note (Signed)
Patient has transferred to ICU. PT has attempted to see patient but due to patient's medical condition, they are unable to work with him as of yet.  Shela Leff MSW,LCSW [phone]

## 2015-12-23 NOTE — Progress Notes (Signed)
PT Cancellation Note  Patient Details Name: John [name]. MRN: [id number] DOB: 14-Jan-1975   Cancelled Treatment:    Reason Eval/Treat Not Completed: Other (comment). No new orders received at this time. Will cancel current order. Please re-order when medically ready.   Sargon Scouten 12/23/2015, 12:32 PM  [name], PT, DPT [phone]

## 2015-12-24 LAB — GLUCOSE, CAPILLARY
GLUCOSE-CAPILLARY: 114 mg/dL — AB (ref 65–99)
GLUCOSE-CAPILLARY: 113 mg/dL — AB (ref 65–99)
Glucose-Capillary: 97 mg/dL (ref 65–99)
Glucose-Capillary: 115 mg/dL — ABNORMAL HIGH (ref 65–99)

## 2015-12-24 NOTE — Progress Notes (Signed)
   [place] at [place] NAME: John [name]    MR#:  [id number]  DATE OF BIRTH:  11-23-1975  SUBJECTIVE:  CHIEF COMPLAINT:   Chief Complaint  Patient presents with  . Knee Pain  . Shortness of Breath   The patient is lethargic and drowsy, hard to arouse. Off BIPAP, on O2 [place] 2 L. REVIEW OF SYSTEMS:  Review of Systems  Constitutional: Negative for chills, fever and malaise/fatigue.  HENT: Negative for congestion.   Eyes: Negative for blurred vision and double vision.  Respiratory: Positive for shortness of breath. Negative for cough, hemoptysis, sputum production, wheezing and stridor.   Cardiovascular: Positive for leg swelling. Negative for chest pain.  Gastrointestinal: Negative for abdominal pain, diarrhea, nausea and vomiting.  Genitourinary: Negative for dysuria and hematuria.  Musculoskeletal: Negative for back pain and joint pain.  Neurological: Negative for dizziness, focal weakness, loss of consciousness and weakness.  Psychiatric/Behavioral: Negative for depression. The patient is not nervous/anxious.     DRUG ALLERGIES:  No Known Allergies VITALS:  Blood pressure 111/68, pulse 88, temperature 97.8 F (36.6 C), temperature source Oral, resp. rate 20, height 6\' 2"  (1.88 m), weight (!) 347 lb 7.1 oz (157.6 kg), SpO2 100 %. PHYSICAL EXAMINATION:  Physical Exam  Constitutional:  obesity  HENT:  Head: Normocephalic.  Eyes: Conjunctivae and EOM are normal.  Neck: Normal range of motion. Neck supple. No JVD present. No tracheal deviation present.  Cardiovascular: Normal rate, regular rhythm and normal heart sounds.  Exam reveals no gallop.   No murmur heard. Pulmonary/Chest: Effort normal. No respiratory distress. He has no wheezes. He has no rales.  better air entry with diminished breath sounds  Abdominal: Soft. Bowel sounds are normal. He exhibits no distension. There is no tenderness.  Musculoskeletal: He exhibits no edema  or tenderness.  Skin: No rash noted. No erythema.   LABORATORY PANEL:   CBC  Recent Labs Lab 12/21/15 0555  WBC 6.3  HGB 12.5*  HCT 39.4*  PLT 158   ------------------------------------------------------------------------------------------------------------------ Chemistries   Recent Labs Lab 12/21/15 0555  NA 142  K 4.1  CL 98*  CO2 38*  GLUCOSE 92  BUN 8  CREATININE 1.18  CALCIUM 8.6*  MG 1.9   RADIOLOGY:  No results found. ASSESSMENT AND PLAN:   Acute respiratory failure with hypoxia and hypercapnia. On BIPAP at night, on O2 [place] 2L during the day, continue NEB.  Multifocal pneumonia (HAP) Continue rocephin, discontinued vanco and zosyn.  COPD and OSA.  As above.   HTN. Controlled. Continue Atenolol/losartan  DM2, SL ACHS.   paranoid schizophrenia/Personality disorder Continue Depakote/lithium carbonate, taper klonopin.  All the records are reviewed and case discussed with Care Management/Social Worker. Management plans discussed with the patient, family and they are in agreement.  CODE STATUS: full code.  TOTAL TIME TAKING CARE OF THIS PATIENT: 33 minutes.   More than 50% of the time was spent in counseling/coordination of care: YES  POSSIBLE D/C IN 2 DAYS, DEPENDING ON CLINICAL CONDITION.   Demetrios Loll M.D on 12/24/2015 at 2:27 PM  Between 7am to 6pm - Pager - [phone]  After 6pm go to [email or web address] - Proofreader  [facility]  Office  [phone]  CC: Primary care physician; Cletis Athens, MD  Note: This dictation was prepared with Dragon dictation along with smaller phrase technology. Any transcriptional errors that result from this process are unintentional.

## 2015-12-24 NOTE — Plan of Care (Signed)
Problem: Health Behavior/Discharge Planning: Goal: Ability to manage health-related needs will improve Outcome: Progressing BP (!) 136/103   Pulse 78   Temp (!) 96.8 F (36 C) (Oral)   Resp 13   Ht 6\' 2"  (1.88 m)   Wt (!) 157.6 kg (347 lb 7.1 oz)   SpO2 100%   BMI 44.61 kg/m   POC reviewed with patient, cont on q2 turns, vital signs closely monitored and any concerns addressed at this time. Will continue to monitor.  Last bm 12/22 Delayed response

## 2015-12-25 LAB — CULTURE, BLOOD (ROUTINE X 2)
Culture: NO GROWTH
Culture: NO GROWTH

## 2015-12-25 LAB — GLUCOSE, CAPILLARY
Glucose-Capillary: 108 mg/dL — ABNORMAL HIGH (ref 65–99)
Glucose-Capillary: 107 mg/dL — ABNORMAL HIGH (ref 65–99)
Glucose-Capillary: 109 mg/dL — ABNORMAL HIGH (ref 65–99)

## 2015-12-25 LAB — CREATININE, SERUM: Creatinine, Ser: 0.86 mg/dL (ref 0.61–1.24)

## 2015-12-25 MED ORDER — LEVOFLOXACIN 750 MG PO TABS
750.0000 mg | ORAL_TABLET | Freq: Every day | ORAL | Status: DC
  Administered 2015-12-25 – 2015-12-28 (×4): 750 mg via ORAL
  Filled 2015-12-25 (×4): qty 1

## 2015-12-25 MED ORDER — LEVOFLOXACIN 750 MG PO TABS: 750.0000 mg | ORAL_TABLET | Freq: Every day | ORAL | 0 refills | Status: DC

## 2015-12-25 NOTE — Evaluation (Signed)
Physical Therapy Evaluation Patient Details Name: John [name]. MRN: [id number] DOB: 1975-11-13 Today's Date: 12/25/2015   History of Present Illness  40 yo male with onset of pain on BLE's in R knee and L ankle, has not tolerated any mobility with PT.  He is a group home resident, was admitted with respiratory failure, PNA, hypoxia, COPD, DM, schizophrenia.  Clinical Impression  Pt was seen for evaluation with pt declining to stand up.  He is a resident of group home and cannot return without being ambulatory which he was just doing prior to his admission and respiratory failure.  Pain is centered on L ankle and will try to encourage more mobility and time OOB to chair as able.  Pt is able to assist with bed mob but reports any touch of L ankle is too painful to tolerate.      Follow Up Recommendations SNF    Equipment Recommendations  Rolling walker with 5" wheels    Recommendations for Other Services       Precautions / Restrictions Precautions Precautions: Fall Restrictions Weight Bearing Restrictions: No      Mobility  Bed Mobility Overal bed mobility: Needs Assistance Bed Mobility: Supine to Sit;Sit to Supine     Supine to sit: Mod assist Sit to supine: Mod assist   General bed mobility comments: assisted pt to get legs out to EOB and back  Transfers Overall transfer level: Needs assistance Equipment used: Rolling walker (2 wheeled);1 person hand held assist Transfers: Sit to/from Stand Sit to Stand: Total assist;From elevated surface (pt could not stand)         General transfer comment: PT provided max assist and pt did not extend much effort through his legs but related to pain  Ambulation/Gait             General Gait Details: unable  Stairs            Wheelchair Mobility    Modified Rankin (Stroke Patients Only)       Balance Overall balance assessment: Needs assistance Sitting-balance support: Feet supported Sitting  balance-Leahy Scale: Good                                       Pertinent Vitals/Pain Pain Assessment: Faces Faces Pain Scale: Hurts whole lot Pain Location: L ankle Pain Intervention(s): Monitored during session;Repositioned (talked with nursing about pain complaints, no standing)    Home Living Family/patient expects to be discharged to:: Group home                      Prior Function Level of Independence: Independent               Hand Dominance        Extremity/Trunk Assessment   Upper Extremity Assessment Upper Extremity Assessment: Overall WFL for tasks assessed    Lower Extremity Assessment Lower Extremity Assessment: Generalized weakness    Cervical / Trunk Assessment Cervical / Trunk Assessment: Normal  Communication   Communication: No difficulties  Cognition Arousal/Alertness: Awake/alert Behavior During Therapy: Anxious Overall Cognitive Status: No family/caregiver present to determine baseline cognitive functioning                 General Comments: pt is very anxious about his L ankle having pain and has been screened by ortho per his nurse    General Comments  Exercises     Assessment/Plan    PT Assessment Patient needs continued PT services  PT Problem List Decreased strength;Decreased range of motion;Decreased activity tolerance;Decreased mobility;Decreased balance;Decreased coordination;Decreased knowledge of use of DME;Decreased cognition;Cardiopulmonary status limiting activity;Obesity;Pain          PT Treatment Interventions DME instruction;Gait training;Functional mobility training;Therapeutic activities;Therapeutic exercise;Balance training;Neuromuscular re-education;Patient/family education    PT Goals (Current goals can be found in the Care Plan section)  Acute Rehab PT Goals Patient Stated Goal: to get home and to manage L ankle pain PT Goal Formulation: With patient Time For Goal  Achievement: 01/15/16 Potential to Achieve Goals: Good    Frequency Min 2X/week   Barriers to discharge   requires total assist as of now for mobility due to declining to stand    Co-evaluation               End of Session Equipment Utilized During Treatment: Gait belt Activity Tolerance: Patient limited by pain Patient left: in bed;with call bell/phone within reach (communicated to nursing) Nurse Communication: Mobility status;Other (comment) (pain complaints limiting standing)         Time: [id number] PT Time Calculation (min) (ACUTE ONLY): 23 min   Charges:   PT Evaluation $PT Eval Low Complexity: 1 Procedure PT Treatments $Therapeutic Activity: 8-22 mins   PT G Codes:        Ramond Dial 01-16-16, 1:10 PM   Mee Hives, PT MS Acute Rehab Dept. Number: [place] and [place]

## 2015-12-25 NOTE — Progress Notes (Signed)
PT worked with patient this AM and patient was reportedly unable to stand up with assistnace from staff and walker. I attempted x3 to get pt up out of bed to, all three attempts were unsuccessful. Patient states he cant stand up due to weakness and soreness in his right knee and left foot. Ortho has been consulted and evaluated pt, with no intervention needed. He was ambulatory and independent prior to admission per group home leader. Im not quite sure if this is mental related or a physical limitation. MD Bridgett Larsson is aware, he has cancelled d/c and ordered a Psych consult. SW/CM aware, at this time will plan of SNF placement as group wont accept him back if he is not ambulatory.

## 2015-12-25 NOTE — Progress Notes (Signed)
[place] at [place] NAME: John [name]    MR#:  [id number]  DATE OF BIRTH:  [birth year]  SUBJECTIVE:  CHIEF COMPLAINT:   Chief Complaint  Patient presents with  . Knee Pain  . Shortness of Breath   The patient has no complaints, off oxygen with O2 saturation 98%. He complained of Left ankle swelling and tenderness, unable to walk. REVIEW OF SYSTEMS:  Review of Systems  Constitutional: Negative for chills, fever and malaise/fatigue.  HENT: Negative for congestion.   Eyes: Negative for blurred vision and double vision.  Respiratory: Negative for cough, hemoptysis, sputum production, shortness of breath, wheezing and stridor.   Cardiovascular: Negative for chest pain and leg swelling.  Gastrointestinal: Negative for abdominal pain, diarrhea, nausea and vomiting.  Genitourinary: Negative for dysuria and hematuria.  Musculoskeletal: Negative for back pain and joint pain.       Left ankle swelling and pain.  Neurological: Negative for dizziness, focal weakness, loss of consciousness and weakness.  Psychiatric/Behavioral: Negative for depression. The patient is not nervous/anxious.     DRUG ALLERGIES:  No Known Allergies VITALS:  Blood pressure 134/85, pulse 94, temperature 98.3 F (36.8 C), temperature source Oral, resp. rate 15, height 6\' 2"  (1.88 m), weight (!) 329 lb 4.8 oz (149.4 kg), SpO2 99 %. PHYSICAL EXAMINATION:  Physical Exam  Constitutional:  obesity  HENT:  Head: Normocephalic.  Eyes: Conjunctivae and EOM are normal.  Neck: Normal range of motion. Neck supple. No JVD present. No tracheal deviation present.  Cardiovascular: Normal rate, regular rhythm and normal heart sounds.  Exam reveals no gallop.   No murmur heard. Pulmonary/Chest: Effort normal. No respiratory distress. He has no wheezes. He has no rales.  better air entry with diminished breath sounds  Abdominal: Soft. Bowel sounds are normal. He exhibits no  distension. There is no tenderness.  Musculoskeletal: He exhibits no edema or tenderness.  Left ankle swelling and tenderness, no erythema.  Skin: No rash noted. No erythema.   LABORATORY PANEL:   CBC  Recent Labs Lab 12/21/15 0555  WBC 6.3  HGB 12.5*  HCT 39.4*  PLT 158   ------------------------------------------------------------------------------------------------------------------ Chemistries   Recent Labs Lab 12/21/15 0555 12/25/15 0459  NA 142  --   K 4.1  --   CL 98*  --   CO2 38*  --   GLUCOSE 92  --   BUN 8  --   CREATININE 1.18 0.86  CALCIUM 8.6*  --   MG 1.9  --    RADIOLOGY:  No results found. ASSESSMENT AND PLAN:   Acute respiratory failure with hypoxia and hypercapnia. He wasn BIPAP and O2 [place] 2L, off O2.  continue NEB.  Multifocal pneumonia (HAP) disontinue rocephin, changed to Levaquin po.  COPD and OSA.  As above.   HTN. Controlled. Continue Atenolol/losartan  DM2, SL ACHS.   paranoid schizophrenia/Personality disorder Continue Depakote/lithium carbonate, taper klonopin. Psych consult.  Patient reported that his right leg hurts and he cannot walk. CSW explained that patient will not be able to return to his group home if he can't walk and will have to go to a skilled nursing facility. Patient reported that he is agreeable to going to a SNF. All the records are reviewed and case discussed with Care Management/Social Worker. Management plans discussed with the patient, family and they are in agreement.  CODE STATUS: full code.  TOTAL TIME TAKING CARE OF THIS PATIENT: 35 minutes.   More  than 50% of the time was spent in counseling/coordination of care: YES  POSSIBLE D/C IN 2 DAYS, DEPENDING ON CLINICAL CONDITION.   Demetrios Loll M.D on 12/25/2015 at 12:49 PM  Between 7am to 6pm - Pager - [phone]  After 6pm go to [email or web address] - Proofreader  [facility]  Office  [phone]  CC: Primary care  physician; Cletis Athens, MD  Note: This dictation was prepared with Dragon dictation along with smaller phrase technology. Any transcriptional errors that result from this process are unintentional.

## 2015-12-25 NOTE — NC FL2 (Signed)
[place] LEVEL OF CARE SCREENING TOOL     IDENTIFICATION  Patient Name: John [name]. Birthdate: 07-03-1975 Sex: male Admission Date (Current Location): 12/20/2015  [place] and [place] Number:  Engineering geologist and Address:  [facility], [address], [place], [place] [address]      Provider Number: [id number]  Attending Physician Name and Address:  Demetrios Loll, MD  Relative Name and Phone Number:       Current Level of Care: Hospital Recommended Level of Care: [place] Prior Approval Number:    Date Approved/Denied:   PASRR Number:    Discharge Plan: SNF    Current Diagnoses: Patient Active Problem List   Diagnosis Date Noted  . Respiratory failure ([place]) 12/20/2015  . Cardiomyopathy ([place]) 11/28/2015  . Tobacco abuse 11/28/2015  . Acute respiratory failure with hypercapnia ([place])   . OSA (obstructive sleep apnea)   . Essential hypertension 11/25/2015  . Hypothyroidism 11/25/2015  . Paranoid schizophrenia, chronic condition ([place]) 11/25/2015  . Acute respiratory failure with hypoxia ([place])   . Acute exacerbation of COPD with asthma ([place])   . Dyslipidemia associated with type 2 diabetes mellitus ([place])   . Diabetes mellitus due to underlying condition without complication, without long-term current use of insulin ([place])   . Morbid obesity ([place]) 09/29/2008    Orientation RESPIRATION BLADDER Height & Weight     Self, Place, Situation  Normal Continent Weight: (!) 329 lb 4.8 oz (149.4 kg) Height:  6\' 2"  (188 cm)  BEHAVIORAL SYMPTOMS/MOOD NEUROLOGICAL BOWEL NUTRITION STATUS   (none)  (none) Continent Diet (carb modified)  AMBULATORY STATUS COMMUNICATION OF NEEDS Skin   Extensive Assist Verbally Normal                       Personal Care Assistance Level of Assistance  Dressing, Bathing Bathing Assistance: Limited assistance   Dressing Assistance: Limited assistance     Functional Limitations  Info   (no issues)          SPECIAL CARE FACTORS FREQUENCY  PT (By licensed PT)                    Contractures Contractures Info: Not present    Additional Factors Info  Code Status, Allergies Code Status Info: full Allergies Info: nka           Current Medications (12/25/2015):  This is the current hospital active medication list Current Facility-Administered Medications  Medication Dose Route Frequency Provider Last Rate Last Dose  . 0.9 %  sodium chloride infusion  250 mL Intravenous PRN Vishal Mungal, MD      . atenolol (TENORMIN) tablet 100 mg  100 mg Oral Daily Bincy S Varughese, NP   100 mg at 12/25/15 0814  . divalproex (DEPAKOTE ER) 24 hr tablet 2,000 mg  2,000 mg Oral QHS Bincy S Varughese, NP   2,000 mg at 12/24/15 2054  . enoxaparin (LOVENOX) injection 40 mg  40 mg Subcutaneous Q12H Vishal Mungal, MD   40 mg at 12/25/15 0813  . insulin aspart (novoLOG) injection 0-5 Units  0-5 Units Subcutaneous QHS Demetrios Loll, MD   0 Units at 12/23/15 2201  . insulin aspart (novoLOG) injection 0-9 Units  0-9 Units Subcutaneous TID WC Demetrios Loll, MD   1 Units at 12/22/15 1644  . levofloxacin (LEVAQUIN) tablet 750 mg  750 mg Oral Daily Demetrios Loll, MD      . levothyroxine (  SYNTHROID, LEVOTHROID) tablet 75 mcg  75 mcg Oral QAC breakfast Holley Raring, NP   75 mcg at 12/25/15 0814  . lithium carbonate (ESKALITH) CR tablet 450 mg  450 mg Oral QHS Bincy S Varughese, NP   450 mg at 12/24/15 2054  . losartan (COZAAR) tablet 50 mg  50 mg Oral Daily Bincy S Varughese, NP   50 mg at 12/25/15 0814  . modafinil (PROVIGIL) tablet 200 mg  200 mg Oral Daily Wilhelmina Mcardle, MD   200 mg at 12/25/15 [id number]  . mometasone-formoterol (DULERA) 200-5 MCG/ACT inhaler 2 puff  2 puff Inhalation BID Holley Raring, NP   2 puff at 12/25/15 0814  . simvastatin (ZOCOR) tablet 20 mg  20 mg Oral QHS Bincy S Varughese, NP   20 mg at 12/24/15 2053     Discharge Medications: Please see discharge summary  for a list of discharge medications.  Relevant Imaging Results:  Relevant Lab Results:   Additional Information [id number]  Shela Leff, LCSW

## 2015-12-25 NOTE — Progress Notes (Signed)
Clinical Education officer, museum (CSW) contacted [facility] owner Flo Shanks [phone] to give him an update of patient. Per Pleas Patricia patient is very independent with ambulation at base line. Pleas Patricia reported that patient can return to the group home when stable and he will accept him back tomorrow 12/26/15 if stable. Pleas Patricia requested for CSW to call him when patient is ready for D/C.   Thanks! [employer], LCSW [phone]

## 2015-12-25 NOTE — Progress Notes (Signed)
RN and Psychologist, educational and Holiday representative (CSW) were in patient's room encouraging him to walk. Patient reported that his right leg hurts and he cannot walk. CSW explained that patient will not be able to return to his group home if he can't walk and will have to go to a skilled nursing facility. Patient reported that he is agreeable to going to a SNF. FL2 complete and faxed out. PASARR has been started and it is pending. Patient will likely be a level 2 PASARR. CSW will continue to follow and assist as needed.   [employer], LCSW [phone]

## 2015-12-25 NOTE — Discharge Summary (Signed)
[facility] [place] NAME: John [name]    MR#:  [id number]  DATE OF BIRTH:  [birth year]  DATE OF ADMISSION:  12/20/2015   ADMITTING PHYSICIAN: Fritzi Mandes, MD  DATE OF DISCHARGE: 12/25/2015  PRIMARY CARE PHYSICIAN: MASOUD,JAVED, MD   ADMISSION DIAGNOSIS:  Hypercarbia [R06.89] Hypoxia [R09.02] Multifocal pneumonia [J18.9] Dyspnea, unspecified type [R06.00] DISCHARGE DIAGNOSIS:  Active Problems:   Respiratory failure ([name]) Multifocal pneumonia (HAP) Acute respiratory failure with hypoxia and hypercapnia SECONDARY DIAGNOSIS:   Past Medical History:  Diagnosis Date  . Anemia   . Asthma   . Diabetes mellitus   . Morbid obesity ([place])   . Paranoid schizophrenia ([place])   . Personality disorder   . Sleep apnea   . Thyroid disease    HOSPITAL COURSE:   Acute respiratory failure with hypoxia and hypercapnia. He was on BIPAPand O2 [place] 2L, off O2 with SAT 98%,  continue NEB.  Multifocal pneumonia (HAP) Continue rocephin, discontinued vanco and zosyn.  COPD and OSA.  As above.   HTN. Controlled. Continue Atenolol/losartan  DM2, SL ACHS.  paranoid schizophrenia/Personality disorder Continue Depakote/lithium carbonate, taper klonopin.  DISCHARGE CONDITIONS:  Stable, discharge to group home with HHPT today. CONSULTS OBTAINED:  Treatment Team:  Dereck Leep, MD DRUG ALLERGIES:  No Known Allergies DISCHARGE MEDICATIONS:   Allergies as of 12/25/2015   No Known Allergies     Medication List    STOP taking these medications   meloxicam 7.5 MG tablet Commonly known as:  MOBIC     TAKE these medications   Armodafinil 250 MG tablet Take 250 mg by mouth daily.   atenolol 100 MG tablet Commonly known as:  TENORMIN Take 100 mg by mouth daily.   budesonide-formoterol 160-4.5 MCG/ACT inhaler Commonly known as:  SYMBICORT Inhale 1 puff into the lungs 2 (two) times daily.   clobetasol cream 0.05 % Commonly known  as:  TEMOVATE Apply 1 application topically 2 (two) times daily.   clonazePAM 0.5 MG tablet Commonly known as:  KLONOPIN Take 0.5 mg by mouth 3 (three) times daily.   clozapine 200 MG tablet Commonly known as:  CLOZARIL Take 1 tablet (200 mg total) by mouth daily. What changed:  when to take this   divalproex 500 MG 24 hr tablet Commonly known as:  DEPAKOTE ER Take 2,000 mg by mouth at bedtime.   fluocinolone 0.01 % external solution Commonly known as:  SYNALAR Apply 1 mL topically 2 (two) times daily. Apply to scalp, neck and ears.   glipiZIDE 5 MG 24 hr tablet Commonly known as:  GLUCOTROL XL Take 5 mg by mouth 2 (two) times daily.   latanoprost 0.005 % ophthalmic solution Commonly known as:  XALATAN Place 1 drop into both eyes at bedtime.   levofloxacin 750 MG tablet Commonly known as:  LEVAQUIN Take 1 tablet (750 mg total) by mouth daily.   levothyroxine 75 MCG tablet Commonly known as:  SYNTHROID, LEVOTHROID Take 75 mcg by mouth daily.   lithium carbonate 450 MG CR tablet Commonly known as:  ESKALITH Take 450 mg by mouth at bedtime.   loratadine 10 MG tablet Commonly known as:  CLARITIN Take 10 mg by mouth daily.   losartan 50 MG tablet Commonly known as:  COZAAR Take 1 tablet (50 mg total) by mouth daily.   metFORMIN 1000 MG tablet Commonly known as:  GLUCOPHAGE Take 1,000 mg by mouth 2 (two) times daily with a meal.  multivitamins ther. w/minerals Tabs tablet Take 1 tablet by mouth daily.   omeprazole 20 MG capsule Commonly known as:  PRILOSEC Take 20 mg by mouth daily.   oxybutynin 5 MG tablet Commonly known as:  DITROPAN Take 5 mg by mouth daily.   predniSONE 20 MG tablet Commonly known as:  DELTASONE Take 3 tablets (60 mg total) by mouth daily with breakfast.   simvastatin 20 MG tablet Commonly known as:  ZOCOR Take 20 mg by mouth at bedtime.   triamcinolone cream 0.1 % Commonly known as:  KENALOG Apply 1 application topically 2  (two) times daily.        DISCHARGE INSTRUCTIONS:  See AVS.   If you experience worsening of your admission symptoms, develop shortness of breath, life threatening emergency, suicidal or homicidal thoughts you must seek medical attention immediately by calling 911 or calling your MD immediately  if symptoms less severe.  You Must read complete instructions/literature along with all the possible adverse reactions/side effects for all the Medicines you take and that have been prescribed to you. Take any new Medicines after you have completely understood and accpet all the possible adverse reactions/side effects.   Please note  You were cared for by a hospitalist during your hospital stay. If you have any questions about your discharge medications or the care you received while you were in the hospital after you are discharged, you can call the unit and asked to speak with the hospitalist on call if the hospitalist that took care of you is not available. Once you are discharged, your primary care physician will handle any further medical issues. Please note that NO REFILLS for any discharge medications will be authorized once you are discharged, as it is imperative that you return to your primary care physician (or establish a relationship with a primary care physician if you do not have one) for your aftercare needs so that they can reassess your need for medications and monitor your lab values.    On the day of Discharge:  VITAL SIGNS:  Blood pressure 134/85, pulse 94, temperature 98.3 F (36.8 C), temperature source Oral, resp. rate 15, height 6\' 2"  (1.88 m), weight (!) 329 lb 4.8 oz (149.4 kg), SpO2 99 %. PHYSICAL EXAMINATION:  GENERAL:  40 y.o.-year-old patient lying in the bed with no acute distress. Obese. EYES: Pupils equal, round, reactive to light and accommodation. No scleral icterus. Extraocular muscles intact.  HEENT: Head atraumatic, normocephalic. Oropharynx and nasopharynx  clear.  NECK:  Supple, no jugular venous distention. No thyroid enlargement, no tenderness.  LUNGS: Normal breath sounds bilaterally, no wheezing, rales,rhonchi or crepitation. No use of accessory muscles of respiration.  CARDIOVASCULAR: S1, S2 normal. No murmurs, rubs, or gallops.  ABDOMEN: Soft, non-tender, non-distended. Bowel sounds present. No organomegaly or mass.  EXTREMITIES: No pedal edema, cyanosis, or clubbing.  NEUROLOGIC: Cranial nerves II through XII are intact. Muscle strength 5/5 in all extremities. Sensation intact. Gait not checked.  PSYCHIATRIC: The patient is alert and oriented x 3.  SKIN: No obvious rash, lesion, or ulcer.  DATA REVIEW:   CBC  Recent Labs Lab 12/21/15 0555  WBC 6.3  HGB 12.5*  HCT 39.4*  PLT 158    Chemistries   Recent Labs Lab 12/21/15 0555 12/25/15 0459  NA 142  --   K 4.1  --   CL 98*  --   CO2 38*  --   GLUCOSE 92  --   BUN 8  --  CREATININE 1.18 0.86  CALCIUM 8.6*  --   MG 1.9  --      Microbiology Results  Results for orders placed or performed during the hospital encounter of 12/20/15  MRSA PCR Screening     Status: None   Collection Time: 12/20/15  9:36 AM  Result Value Ref Range Status   MRSA by PCR NEGATIVE NEGATIVE Final    Comment:        The GeneXpert MRSA Assay (FDA approved for NASAL specimens only), is one component of a comprehensive MRSA colonization surveillance program. It is not intended to diagnose MRSA infection nor to guide or monitor treatment for MRSA infections.   Blood culture (routine x 2)     Status: None   Collection Time: 12/20/15  6:28 PM  Result Value Ref Range Status   Specimen Description BLOOD  R AC  Final   Special Requests BOTTLES DRAWN AEROBIC AND ANAEROBIC ANA 7 AER 9 ML  Final   Culture NO GROWTH 5 DAYS  Final   Report Status 12/25/2015 FINAL  Final  Blood culture (routine x 2)     Status: None   Collection Time: 12/20/15  6:29 PM  Result Value Ref Range Status    Specimen Description BLOOD L AC  Final   Special Requests   Final    BOTTLES DRAWN AEROBIC AND ANAEROBIC  AER 5 ANA 5 ML   Culture NO GROWTH 5 DAYS  Final   Report Status 12/25/2015 FINAL  Final    RADIOLOGY:  No results found.   Management plans discussed with the patient, family and they are in agreement.  CODE STATUS:     Code Status Orders        Start     Ordered   12/20/15 1613  Full code  Continuous     12/20/15 1614    Code Status History    Date Active Date Inactive Code Status Order ID Comments User Context   11/25/2015 10:37 AM 11/28/2015  9:34 PM Full Code [id number]  Robbie Lis, MD Inpatient      TOTAL TIME TAKING CARE OF THIS PATIENT: 37 minutes.    Demetrios Loll M.D on 12/25/2015 at 11:06 AM  Between 7am to 6pm - Pager - [phone]  After 6pm go to [email or web address] - Proofreader  [facility]  Office  [phone]  CC: Primary care physician; Cletis Athens, MD   Note: This dictation was prepared with Dragon dictation along with smaller phrase technology. Any transcriptional errors that result from this process are unintentional.

## 2015-12-26 ENCOUNTER — Inpatient Hospital Stay: Payer: Medicare Other

## 2015-12-26 LAB — CBC WITH DIFFERENTIAL/PLATELET
Basophils Absolute: 0 10*3/uL (ref 0–0.1)
Basophils Relative: 0 %
EOS ABS: 0 10*3/uL (ref 0–0.7)
EOS PCT: 0 %
HCT: 37.9 % — ABNORMAL LOW (ref 40.0–52.0)
Hemoglobin: 12.3 g/dL — ABNORMAL LOW (ref 13.0–18.0)
LYMPHS ABS: 1.1 10*3/uL (ref 1.0–3.6)
Lymphocytes Relative: 12 %
MCH: 26.5 pg (ref 26.0–34.0)
MCHC: 32.5 g/dL (ref 32.0–36.0)
MCV: 81.5 fL (ref 80.0–100.0)
Monocytes Absolute: 1.1 10*3/uL — ABNORMAL HIGH (ref 0.2–1.0)
Monocytes Relative: 12 %
Neutro Abs: 6.7 10*3/uL — ABNORMAL HIGH (ref 1.4–6.5)
Neutrophils Relative %: 76 %
PLATELETS: 206 10*3/uL (ref 150–440)
RBC: 4.65 MIL/uL (ref 4.40–5.90)
RDW: 16.1 % — AB (ref 11.5–14.5)
WBC: 9 10*3/uL (ref 3.8–10.6)

## 2015-12-26 LAB — HEPATIC FUNCTION PANEL
ALBUMIN: 3.2 g/dL — AB (ref 3.5–5.0)
ALK PHOS: 37 U/L — AB (ref 38–126)
ALT: 13 U/L — ABNORMAL LOW (ref 17–63)
AST: 15 U/L (ref 15–41)
Bilirubin, Direct: <0.1 mg/dL — ABNORMAL LOW (ref 0.1–0.5)
Total Bilirubin: 0.6 mg/dL (ref 0.3–1.2)
Total Protein: 7.4 g/dL (ref 6.5–8.1)

## 2015-12-26 LAB — VALPROIC ACID LEVEL: VALPROIC ACID LVL: 78 ug/mL (ref 50.0–100.0)

## 2015-12-26 LAB — GLUCOSE, CAPILLARY
GLUCOSE-CAPILLARY: 118 mg/dL — AB (ref 65–99)
Glucose-Capillary: 110 mg/dL — ABNORMAL HIGH (ref 65–99)
Glucose-Capillary: 171 mg/dL — ABNORMAL HIGH (ref 65–99)
Glucose-Capillary: 152 mg/dL — ABNORMAL HIGH (ref 65–99)

## 2015-12-26 LAB — LITHIUM LEVEL: Lithium Lvl: 0.36 mmol/L — ABNORMAL LOW (ref 0.60–1.20)

## 2015-12-26 MED ORDER — CLOZAPINE 100 MG PO TABS
200.0000 mg | ORAL_TABLET | Freq: Every day | ORAL | Status: DC
Start: 2015-12-26 — End: 2015-12-26

## 2015-12-26 MED ORDER — CLOZAPINE 100 MG PO TABS
50.0000 mg | ORAL_TABLET | Freq: Every day | ORAL | Status: DC
  Administered 2015-12-26: 50 mg via ORAL
  Filled 2015-12-26: qty 1

## 2015-12-26 NOTE — Progress Notes (Signed)
[facility] [place] NAME: John [name]    MR#:  [id number]  DATE OF BIRTH:  10/09/1975  SUBJECTIVE:  CHIEF COMPLAINT:   Chief Complaint  Patient presents with  . Knee Pain  . Shortness of Breath    He complained of Left ankle swelling and tenderness, unable to walk. REVIEW OF SYSTEMS:  Review of Systems  Constitutional: Negative for chills, fever and malaise/fatigue.  HENT: Negative for congestion.   Eyes: Negative for blurred vision and double vision.  Respiratory: Negative for cough, hemoptysis, sputum production, shortness of breath, wheezing and stridor.   Cardiovascular: Negative for chest pain and leg swelling.  Gastrointestinal: Negative for abdominal pain, diarrhea, nausea and vomiting.  Genitourinary: Negative for dysuria and hematuria.  Musculoskeletal: Negative for back pain and joint pain.       Left ankle swelling and pain.  Neurological: Negative for dizziness, focal weakness, loss of consciousness and weakness.  Psychiatric/Behavioral: Negative for depression. The patient is not nervous/anxious.     DRUG ALLERGIES:  No Known Allergies VITALS:  Blood pressure 130/88, pulse 100, temperature 99.1 F (37.3 C), temperature source Axillary, resp. rate 19, height 6\' 2"  (1.88 m), weight (!) 330 lb 14.4 oz (150.1 kg), SpO2 99 %. PHYSICAL EXAMINATION:  Physical Exam  Constitutional:  obesity  HENT:  Head: Normocephalic.  Eyes: Conjunctivae and EOM are normal.  Neck: Normal range of motion. Neck supple. No JVD present. No tracheal deviation present.  Cardiovascular: Normal rate, regular rhythm and normal heart sounds.  Exam reveals no gallop.   No murmur heard. Pulmonary/Chest: Effort normal. No respiratory distress. He has no wheezes. He has no rales.  better air entry with diminished breath sounds  Abdominal: Soft. Bowel sounds are normal. He exhibits no distension. There is no tenderness.  Musculoskeletal: He exhibits  no edema or tenderness.  Left ankle swelling and tenderness, no erythema.  Skin: No rash noted. No erythema.   LABORATORY PANEL:   CBC  Recent Labs Lab 12/21/15 0555  WBC 6.3  HGB 12.5*  HCT 39.4*  PLT 158   ------------------------------------------------------------------------------------------------------------------ Chemistries   Recent Labs Lab 12/21/15 0555 12/25/15 0459  NA 142  --   K 4.1  --   CL 98*  --   CO2 38*  --   GLUCOSE 92  --   BUN 8  --   CREATININE 1.18 0.86  CALCIUM 8.6*  --   MG 1.9  --    RADIOLOGY:  No results found. ASSESSMENT AND PLAN:   Acute respiratory failure with hypoxia and hypercapnia. He was on BIPAP at night and O2 [place] 2L, off O2.  continue NEB.  Multifocal pneumonia (HAP) disontinue rocephin, changed to Levaquin po.  COPD and OSA.  As above.   HTN. Controlled. Continue Atenolol/losartan  DM2, SL ACHS.   paranoid schizophrenia/Personality disorder Continue Depakote/lithium carbonate, taper klonopin. Psych consult.  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 Marry Guan. Left ankle pain. Check x-ray. Pain control.  Patient reported that his right leg hurts and he cannot walk. CSW explained that patient will not be able to return to his group home if he can't walk and will have to go to a skilled nursing facility. Patient reported that he is agreeable to going to a SNF. All the records are reviewed and case discussed with Care Management/Social Worker. Management plans discussed with the patient, family and they are in agreement.  CODE  STATUS: full code.  TOTAL TIME TAKING CARE OF THIS PATIENT: 35 minutes.   More than 50% of the time was spent in counseling/coordination of care: YES  POSSIBLE D/C IN 2 DAYS, DEPENDING ON CLINICAL CONDITION.   Demetrios Loll M.D on 12/26/2015 at 12:01 PM  Between 7am to 6pm - Pager - [phone]  After 6pm go to [email or web address] - [place]  [facility]  Office  [phone]  CC: Primary care physician; Cletis Athens, MD  Note: This dictation was prepared with Dragon dictation along with smaller phrase technology. Any transcriptional errors that result from this process are unintentional.

## 2015-12-26 NOTE — Consult Note (Signed)
Consult received to see this 40 y/o male with schizophrenia.  Admitted with respiratory failure on 12/19. I was unable to see pt today.  Will ask Dr. Weber Cooks to see him tomorrow or try to arrange for telemed consult today. I have reviewed chart and labs.  These are my recommendations:   Pt treated at group home with  -Clozaril 200 mg qhs -Depakote 2000 mg qhs -Lithium 450 mg qhs -Klonopin 0.5 tid  Due to respiratory failure klonopin has been taper off.  Pt has not been on clozaril since admission.  Will order CBC with diff and pharmacy consult for weekly  [place] monitoring.  Pt will be started on clozaril 50 mg qhs.  Will titrate up daily until back on 200 mg.  Continue depakote---will check a ramdon level and LFT's today.  PK are wnl  Continue lithium--- will check a random lithium level today.  Creatinine 0.86 GFR wnl  EKG on 12/19 QTC >481.  Repeat EKG.

## 2015-12-26 NOTE — Progress Notes (Signed)
Dr. Jerilee Hoh ordered EKG for QTc check. EKG shows NSR with QTc 475. MD notified. No new orders. Will continue to monitor.

## 2015-12-26 NOTE — Progress Notes (Signed)
MEDICATION RELATED CONSULT NOTE - INITIAL   Pharmacy Consult for CLOZAPINE   No Known Allergies  Patient Measurements: Height: 6\' 2"  (188 cm) Weight: (!) 330 lb 14.4 oz (150.1 kg) IBW/kg (Calculated) : 82.2 Adjusted Body Weight:  Labs:  Recent Labs  12/25/15 0459 12/26/15 1340  WBC  --  9.0  HGB  --  12.3*  HCT  --  37.9*  PLT  --  206  CREATININE 0.86  --    Estimated Creatinine Clearance: 180.2 mL/min (by C-G formula based on SCr of 0.86 mg/dL).    Medical History: Past Medical History:  Diagnosis Date  . Anemia   . Asthma   . Diabetes mellitus   . Morbid obesity ([place])   . Paranoid schizophrenia ([place])   . Personality disorder   . Sleep apnea   . Thyroid disease     Medications:  Scheduled:  . atenolol  100 mg Oral Daily  . cloZAPine  50 mg Oral QHS  . divalproex  2,000 mg Oral QHS  . enoxaparin (LOVENOX) injection  40 mg Subcutaneous Q12H  . insulin aspart  0-5 Units Subcutaneous QHS  . insulin aspart  0-9 Units Subcutaneous TID WC  . levofloxacin  750 mg Oral Daily  . levothyroxine  75 mcg Oral QAC breakfast  . lithium carbonate  450 mg Oral QHS  . losartan  50 mg Oral Daily  . modafinil  200 mg Oral Daily  . mometasone-formoterol  2 puff Inhalation BID  . simvastatin  20 mg Oral QHS    Assessment: 40 yo male being restarted on Clozaril per Psychiatry. Patient was on Clozaril 200mg  qhs at group home but has been off med since admission per Psych note. Plan will be to tirtrate back to PTA dose of 200mg  qhs.  Info:  Zip code= [id number]  12/25  ANC= 6.7  Plan:  Labwork has been submitted to Clozapine [facility] program.  Continue to monitor ANC weekly while inpatient. Next CBC/ w/ diff ordered for 01/02/16.  Doyel Mulkern A 12/26/2015,2:09 PM

## 2015-12-27 DIAGNOSIS — F200 Paranoid schizophrenia: Secondary | ICD-10-CM

## 2015-12-27 LAB — GLUCOSE, CAPILLARY
GLUCOSE-CAPILLARY: 132 mg/dL — AB (ref 65–99)
Glucose-Capillary: 129 mg/dL — ABNORMAL HIGH (ref 65–99)
Glucose-Capillary: 133 mg/dL — ABNORMAL HIGH (ref 65–99)
Glucose-Capillary: 93 mg/dL (ref 65–99)
Glucose-Capillary: 134 mg/dL — ABNORMAL HIGH (ref 65–99)

## 2015-12-27 LAB — CREATININE, SERUM
Creatinine, Ser: 0.89 mg/dL (ref 0.61–1.24)
GFR calc non Af Amer: >60 mL/min (ref >60)

## 2015-12-27 LAB — URIC ACID: URIC ACID, SERUM: 8.5 mg/dL — AB (ref 4.4–7.6)

## 2015-12-27 MED ORDER — ACETAMINOPHEN 325 MG PO TABS
650.0000 mg | ORAL_TABLET | Freq: Four times a day (QID) | ORAL | Status: DC | PRN
  Administered 2015-12-27 (×2): 650 mg via ORAL
  Filled 2015-12-27 (×2): qty 2

## 2015-12-27 MED ORDER — CLOZAPINE 100 MG PO TABS
200.0000 mg | ORAL_TABLET | Freq: Every day | ORAL | Status: DC
  Administered 2015-12-27: 200 mg via ORAL
  Filled 2015-12-27: qty 2

## 2015-12-27 NOTE — Consult Note (Signed)
ORTHOPAEDICS: Patient denies any right knee pain. The patient reported the insidious onset of left ankle pain and swelling. He has apparently refused weightbearing or gait training due to the pain. He denies any history of gout.  Afebrile, vital signs stable. Right knee is nontender to palpation. No knee effusion or soft tissue swelling. No increased warmth. Left ankle demonstrates some soft tissue swelling. There is tenderness to palpation about the ankle. Mild increased warmth is appreciated but no gross erythema.  I ordered a uric acid this morning. Uric acid was elevated at 8.5. The possibility of a gouty flare should be considered.  Annelies Coyt P. Holley Bouche M.D.

## 2015-12-27 NOTE — Progress Notes (Addendum)
[facility] [place] NAME: John [name]    MR#:  [id number]  DATE OF BIRTH:  11-06-1975  SUBJECTIVE:  CHIEF COMPLAINT:   Chief Complaint  Patient presents with  . Knee Pain  . Shortness of Breath    He better left ankle swelling and tenderness.  Fever 101.4 last night. REVIEW OF SYSTEMS:  Review of Systems  Constitutional: Negative for chills, fever and malaise/fatigue.  HENT: Negative for congestion.   Eyes: Negative for blurred vision and double vision.  Respiratory: Negative for cough, hemoptysis, sputum production, shortness of breath, wheezing and stridor.   Cardiovascular: Negative for chest pain and leg swelling.  Gastrointestinal: Negative for abdominal pain, diarrhea, nausea and vomiting.  Genitourinary: Negative for dysuria and hematuria.  Musculoskeletal: Negative for back pain and joint pain.       Left ankle swelling and pain.  Neurological: Negative for dizziness, focal weakness, loss of consciousness and weakness.  Psychiatric/Behavioral: Negative for depression. The patient is not nervous/anxious.     DRUG ALLERGIES:  No Known Allergies VITALS:  Blood pressure 139/85, pulse 100, temperature 98.9 F (37.2 C), temperature source Oral, resp. rate 16, height 6\' 2"  (1.88 m), weight (!) 328 lb 12.8 oz (149.1 kg), SpO2 93 %. PHYSICAL EXAMINATION:  Physical Exam  Constitutional:  obesity  HENT:  Head: Normocephalic.  Eyes: Conjunctivae and EOM are normal.  Neck: Normal range of motion. Neck supple. No JVD present. No tracheal deviation present.  Cardiovascular: Normal rate, regular rhythm and normal heart sounds.  Exam reveals no gallop.   No murmur heard. Pulmonary/Chest: Effort normal. No respiratory distress. He has no wheezes. He has no rales.  better air entry with diminished breath sounds  Abdominal: Soft. Bowel sounds are normal. He exhibits no distension. There is no tenderness.  Musculoskeletal: He exhibits  no edema or tenderness.  Left ankle mild tenderness, no erythema.  Skin: No rash noted. No erythema.   LABORATORY PANEL:   CBC  Recent Labs Lab 12/26/15 1340  WBC 9.0  HGB 12.3*  HCT 37.9*  PLT 206   ------------------------------------------------------------------------------------------------------------------ Chemistries   Recent Labs Lab 12/21/15 0555  12/26/15 1340 12/27/15 0453  NA 142  --   --   --   K 4.1  --   --   --   CL 98*  --   --   --   CO2 38*  --   --   --   GLUCOSE 92  --   --   --   BUN 8  --   --   --   CREATININE 1.18  < >  --  0.89  CALCIUM 8.6*  --   --   --   MG 1.9  --   --   --   AST  --   --  15  --   ALT  --   --  13*  --   ALKPHOS  --   --  37*  --   BILITOT  --   --  0.6  --   < > = values in this interval not displayed. RADIOLOGY:  Dg Ankle Complete Left  Result Date: 12/26/2015 CLINICAL DATA:  40 year old male with chronic pain and swelling of ankle. Not sure if injured. Initial encounter. EXAM: LEFT ANKLE COMPLETE - 3+ VIEW COMPARISON:  None. FINDINGS: No acute fracture or dislocation. Soft tissue prominence may be related to dependent edema. Cellulitis not excluded in proper  clinical setting. No plain film evidence of osteomyelitis. Mild degenerative changes. Small spur Achilles tendon insertion site. IMPRESSION: No acute fracture or dislocation. Soft tissue prominence may be related to dependent edema. Cellulitis not excluded in proper clinical setting. No plain film evidence of osteomyelitis. Mild degenerative changes. Electronically Signed   By: Genia Del M.D.   On: 12/26/2015 14:52   ASSESSMENT AND PLAN:   Acute respiratory failure with hypoxia and hypercapnia.  on BIPAP at night and off O2.  continue NEB.  Multifocal pneumonia (HAP) disontinued rocephin, changed to Levaquin po.  COPD and OSA.  As above.   HTN. Controlled. Continue Atenolol/losartan  DM2, SL ACHS.   paranoid schizophrenia/Personality  disorder Continue Depakote/lithium carbonate, tapered klonopin. restarted on clozaril 50 mg qhs.  Will titrate up daily until back on 200 mg per Psych consult.  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 Marry Guan. Left ankle pain. Check x-ray: no facture. Pain control.  Fever. Check urinalysis.  All the records are reviewed and case discussed with Care Management/Social Worker. Management plans discussed with the patient, family and they are in agreement.  CODE STATUS: full code.  TOTAL TIME TAKING CARE OF THIS PATIENT: 35 minutes.   More than 50% of the time was spent in counseling/coordination of care: YES  POSSIBLE D/C IN 2 DAYS, DEPENDING ON CLINICAL CONDITION.   Demetrios Loll M.D on 12/27/2015 at 12:45 PM  Between 7am to 6pm - Pager - [phone]  After 6pm go to [email or web address] - Proofreader  [facility]  Office  [phone]  CC: Primary care physician; Cletis Athens, MD  Note: This dictation was prepared with Dragon dictation along with smaller phrase technology. Any transcriptional errors that result from this process are unintentional.

## 2015-12-27 NOTE — Progress Notes (Addendum)
Clinical Education officer, museum (CSW) met with patient and provided bed offer. Patient accepted the only bed offer he had, which was [facility]. PASARR is still pending. Per MD patient may need Bi-pap at night however it is unclear if he will require a Bi-pap at discharge. Per [facility] admissions coordinator at [facility] they could provide a Bi-pap for patient. CSW gave Doug Bi-pap settings 16/8 at 25% oxygen rate of 8. CSW will continue to follow and assist as needed.   PASARR was received [id number] E expires 01/26/2016.       [employer], LCSW [phone]

## 2015-12-27 NOTE — Progress Notes (Signed)
Called by Dr. Marry Guan.  Uric acid elevated which may be likely contributing to his symptoms.  May need to be started on treatment for same

## 2015-12-27 NOTE — Plan of Care (Signed)
Problem: Activity: Goal: Risk for activity intolerance will decrease Outcome: Not Progressing Minimal complaints of pain.  Intermittently refuses to get out of bed to bathroom and insists on using bedpan.  Ambulated X1 with walker and 1 asist to bathroom this shift.

## 2015-12-27 NOTE — Consult Note (Signed)
[facility] Consult   Reason for Consult:  Consult for 40 year old man with a history of chronic mental illness about his medication management Referring Physician:  Bridgett Larsson Patient Identification: John [name]. MRN:  [id number] Principal Diagnosis: Schizophrenia Diagnosis:   Patient Active Problem List   Diagnosis Date Noted  . Respiratory failure ([place]) [J96.90] 12/20/2015  . Cardiomyopathy ([place]) [I42.9] 11/28/2015  . Tobacco abuse [Z72.0] 11/28/2015  . Acute respiratory failure with hypercapnia ([name]) [J96.02]   . OSA (obstructive sleep apnea) [G47.33]   . Essential hypertension [I10] 11/25/2015  . Hypothyroidism [E03.9] 11/25/2015  . Paranoid schizophrenia, chronic condition ([place]) [F20.0] 11/25/2015  . Acute respiratory failure with hypoxia ([place]) [J96.01]   . Acute exacerbation of COPD with asthma ([place]) [J44.1, J45.901]   . Dyslipidemia associated with type 2 diabetes mellitus ([place]) [E11.69, E78.5]   . Diabetes mellitus due to underlying condition without complication, without long-term current use of insulin ([name]) [E08.9]   . Morbid obesity ([place]) [E66.01] 09/29/2008    Total Time spent with patient: 45 minutes  Subjective:   John Kallal. is a 40 y.o. male patient admitted with "my knee was hurting".  HPI:  Patient interviewed. Patient is not a very clear historian. Chart reviewed. This is a 40 year old man with a history of chronic mental illness who was admitted to the critical care unit in respiratory distress. Seems like he probably has a history of respiratory failure and cardiomyopathy. Question was raised about medication management for his psychiatry. Patient interviewed. Patient could only tell me that he came into the hospital because his legs were weak. He did know much more about it. He had no psychiatric complaints. Denied any problems with his mood. Denied any suicidal or homicidal ideation. Denied any hallucinations. He could not tell me any of the  medicines he is taking but says that he just takes whatever he is prescribed normally at home. Doesn't report any recent particular stressor.  Social history: Patient lives in a group home. Doesn't seem to have much contact with family.  Medical history: Multiple medical problems including obesity cardiomyopathy hypothyroidism diabetes  Substance abuse history: No documented history of substance abuse problems  Past Psychiatric History: Long-standing mental health history. Has been followed by various providers in the community. Not clear to me exactly who is seeing him recently. He was apparently admitted here on a combination of clozapine and lithium and Depakote. That's a little heavier than what he was on when I saw him a year and a half ago. For reasons I can't quite figure out his clozapine dose has been cut from 200 mg when he was admitted down to 50 mg today. I'm not sure that I see a documented reason for that. No history of violence or suicide attempts  Risk to Self: Is patient at risk for suicide?: No Risk to Others:   Prior Inpatient Therapy:   Prior Outpatient Therapy:    Past Medical History:  Past Medical History:  Diagnosis Date  . Anemia   . Asthma   . Diabetes mellitus   . Morbid obesity ([place])   . Paranoid schizophrenia ([place])   . Personality disorder   . Sleep apnea   . Thyroid disease     Past Surgical History:  Procedure Laterality Date  . FRACTURE SURGERY     Family History:  Family History  Problem Relation Age of Onset  . Glaucoma Mother    Family Psychiatric  History: Doesn't know of any Social  History:  History  Alcohol Use  . Yes    Comment: only once in a while     History  Drug Use No    Social History   Social History  . Marital status: Single    Spouse name: N/A  . Number of children: N/A  . Years of education: N/A   Social History Main Topics  . Smoking status: Light Tobacco Smoker  . Smokeless tobacco: Never Used  . Alcohol use  Yes     Comment: only once in a while  . Drug use: No  . Sexual activity: Not Asked   Other Topics Concern  . None   Social History Narrative  . None   Additional Social History:    Allergies:  No Known Allergies  Labs:  Results for orders placed or performed during the hospital encounter of 12/20/15 (from the past 48 hour(s))  Glucose, capillary     Status: Abnormal   Collection Time: 12/25/15  4:51 PM  Result Value Ref Range   Glucose-Capillary 107 (H) 65 - 99 mg/dL  Glucose, capillary     Status: Abnormal   Collection Time: 12/25/15  9:21 PM  Result Value Ref Range   Glucose-Capillary 109 (H) 65 - 99 mg/dL  Glucose, capillary     Status: Abnormal   Collection Time: 12/26/15  7:37 AM  Result Value Ref Range   Glucose-Capillary 110 (H) 65 - 99 mg/dL  Glucose, capillary     Status: Abnormal   Collection Time: 12/26/15 11:27 AM  Result Value Ref Range   Glucose-Capillary 171 (H) 65 - 99 mg/dL  Lithium level     Status: Abnormal   Collection Time: 12/26/15  1:40 PM  Result Value Ref Range   Lithium Lvl 0.36 (L) 0.60 - 1.20 mmol/L  Valproic acid level     Status: None   Collection Time: 12/26/15  1:40 PM  Result Value Ref Range   Valproic Acid Lvl 78 50.0 - 100.0 ug/mL  Hepatic function panel     Status: Abnormal   Collection Time: 12/26/15  1:40 PM  Result Value Ref Range   Total Protein 7.4 6.5 - 8.1 g/dL   Albumin 3.2 (L) 3.5 - 5.0 g/dL   AST 15 15 - 41 U/L   ALT 13 (L) 17 - 63 U/L   Alkaline Phosphatase 37 (L) 38 - 126 U/L   Total Bilirubin 0.6 0.3 - 1.2 mg/dL   Bilirubin, Direct <0.1 (L) 0.1 - 0.5 mg/dL   Indirect Bilirubin NOT CALCULATED 0.3 - 0.9 mg/dL  CBC with Differential/Platelet     Status: Abnormal   Collection Time: 12/26/15  1:40 PM  Result Value Ref Range   WBC 9.0 3.8 - 10.6 K/uL   RBC 4.65 4.40 - 5.90 MIL/uL   Hemoglobin 12.3 (L) 13.0 - 18.0 g/dL   HCT 37.9 (L) 40.0 - 52.0 %   MCV 81.5 80.0 - 100.0 fL   MCH 26.5 26.0 - 34.0 pg   MCHC 32.5  32.0 - 36.0 g/dL   RDW 16.1 (H) 11.5 - 14.5 %   Platelets 206 150 - 440 K/uL   Neutrophils Relative % 76 %   Neutro Abs 6.7 (H) 1.4 - 6.5 K/uL   Lymphocytes Relative 12 %   Lymphs Abs 1.1 1.0 - 3.6 K/uL   Monocytes Relative 12 %   Monocytes Absolute 1.1 (H) 0.2 - 1.0 K/uL   Eosinophils Relative 0 %   Eosinophils Absolute 0.0 0 - 0.7  K/uL   Basophils Relative 0 %   Basophils Absolute 0.0 0 - 0.1 K/uL  Glucose, capillary     Status: Abnormal   Collection Time: 12/26/15  4:42 PM  Result Value Ref Range   Glucose-Capillary 118 (H) 65 - 99 mg/dL  Glucose, capillary     Status: Abnormal   Collection Time: 12/26/15  8:58 PM  Result Value Ref Range   Glucose-Capillary 152 (H) 65 - 99 mg/dL   Comment 1 Notify RN   Creatinine, serum     Status: None   Collection Time: 12/27/15  4:53 AM  Result Value Ref Range   Creatinine, Ser 0.89 0.61 - 1.24 mg/dL   GFR calc non Af Amer >60 >60 mL/min   GFR calc Af Amer >60 >60 mL/min    Comment: (NOTE) The eGFR has been calculated using the CKD EPI equation. This calculation has not been validated in all clinical situations. eGFR's persistently <60 mL/min signify possible Chronic Kidney Disease.   Uric acid     Status: Abnormal   Collection Time: 12/27/15  4:53 AM  Result Value Ref Range   Uric Acid, Serum 8.5 (H) 4.4 - 7.6 mg/dL  Glucose, capillary     Status: Abnormal   Collection Time: 12/27/15  7:55 AM  Result Value Ref Range   Glucose-Capillary 129 (H) 65 - 99 mg/dL   Comment 1 Notify RN    Comment 2 Document in Chart   Glucose, capillary     Status: Abnormal   Collection Time: 12/27/15 11:06 AM  Result Value Ref Range   Glucose-Capillary 133 (H) 65 - 99 mg/dL   Comment 1 Notify RN    Comment 2 Document in Chart     Current Facility-Administered Medications  Medication Dose Route Frequency Provider Last Rate Last Dose  . 0.9 %  sodium chloride infusion  250 mL Intravenous PRN Vishal Mungal, MD      . acetaminophen (TYLENOL) tablet  650 mg  650 mg Oral Q6H PRN Demetrios Loll, MD   650 mg at 12/27/15 0907  . atenolol (TENORMIN) tablet 100 mg  100 mg Oral Daily Bincy S Varughese, NP   100 mg at 12/27/15 0844  . cloZAPine (CLOZARIL) tablet 200 mg  200 mg Oral QHS [name], MD      . divalproex (DEPAKOTE ER) 24 hr tablet 2,000 mg  2,000 mg Oral QHS Bincy S Varughese, NP   2,000 mg at 12/26/15 2119  . enoxaparin (LOVENOX) injection 40 mg  40 mg Subcutaneous Q12H Vishal Mungal, MD   40 mg at 12/27/15 0843  . insulin aspart (novoLOG) injection 0-5 Units  0-5 Units Subcutaneous QHS Demetrios Loll, MD   0 Units at 12/23/15 2201  . insulin aspart (novoLOG) injection 0-9 Units  0-9 Units Subcutaneous TID WC Demetrios Loll, MD   1 Units at 12/27/15 1138  . levofloxacin (LEVAQUIN) tablet 750 mg  750 mg Oral Daily Demetrios Loll, MD   750 mg at 12/27/15 0844  . levothyroxine (SYNTHROID, LEVOTHROID) tablet 75 mcg  75 mcg Oral QAC breakfast Holley Raring, NP   75 mcg at 12/27/15 0844  . lithium carbonate (ESKALITH) CR tablet 450 mg  450 mg Oral QHS Bincy S Varughese, NP   450 mg at 12/26/15 2120  . losartan (COZAAR) tablet 50 mg  50 mg Oral Daily Bincy S Varughese, NP   50 mg at 12/27/15 0844  . modafinil (PROVIGIL) tablet 200 mg  200 mg Oral Daily Shanon Brow  Achille Rich, MD   200 mg at 12/27/15 0844  . mometasone-formoterol (DULERA) 200-5 MCG/ACT inhaler 2 puff  2 puff Inhalation BID Holley Raring, NP   2 puff at 12/27/15 0845  . simvastatin (ZOCOR) tablet 20 mg  20 mg Oral QHS Bincy S Varughese, NP   20 mg at 12/26/15 2120    Musculoskeletal: Strength & Muscle Tone: decreased Gait & Station: unable to stand Patient leans: N/A  Psychiatric Specialty Exam: Physical Exam  Nursing note and vitals reviewed. Constitutional: He appears well-developed and well-nourished.  HENT:  Head: Normocephalic and atraumatic.  Eyes: Conjunctivae are normal. Pupils are equal, round, and reactive to light.  Neck: Normal range of motion.  Cardiovascular: Regular  rhythm and normal heart sounds.   Respiratory: Effort normal. No respiratory distress.  GI: Soft. He exhibits no distension.  Musculoskeletal: Normal range of motion.  Neurological: He is alert.  Skin: Skin is warm and dry.  Psychiatric: Judgment normal. His affect is blunt. His speech is delayed and tangential. He is slowed. Thought content is not paranoid. He does not exhibit a depressed mood. He expresses no homicidal and no suicidal ideation. He exhibits abnormal recent memory and abnormal remote memory.    Review of Systems  Constitutional: Negative.   HENT: Negative.   Eyes: Negative.   Respiratory: Negative.   Cardiovascular: Negative.   Gastrointestinal: Negative.   Musculoskeletal: Negative.   Skin: Negative.   Neurological: Negative.   Psychiatric/Behavioral: Negative.     Blood pressure 139/85, pulse 100, temperature 98.9 F (37.2 C), temperature source Oral, resp. rate 16, height 6' 2"  (1.88 m), weight (!) 149.1 kg (328 lb 12.8 oz), SpO2 93 %.Body mass index is 42.22 kg/m.  General Appearance: Casual  Eye Contact:  Good  Speech:  Slow  Volume:  Decreased  Mood:  Euthymic  Affect:  Constricted  Thought Process:  Disorganized  Orientation:  Full (Time, Place, and Person)  Thought Content:  Rumination  Suicidal Thoughts:  No  Homicidal Thoughts:  No  Memory:  Immediate;   Fair Recent;   Poor Remote;   Fair  Judgement:  Fair  Insight:  Fair  Psychomotor Activity:  Decreased  Concentration:  Concentration: Poor  Recall:  [employer] of Knowledge:  Fair  Language:  Fair  Akathisia:  No  Handed:  Right  AIMS (if indicated):     Assets:  Communication Skills Desire for Improvement Housing  ADL's:  Impaired  Cognition:  Impaired,  Mild and Moderate  Sleep:        Treatment Plan Summary: Daily contact with patient to assess and evaluate symptoms and progress in treatment, Medication management and Plan After reviewing the paperwork and the patient is looks to  me like the change in the clozapine dose was probably just a mistake. I am going to go ahead and put him back up to 200 mg clozapine which was his long standing dosage of that. His kidney function looks pretty good. We will probably go ahead and check another Depakote and lithium level tomorrow. Otherwise no change to medicine. I will follow-up as needed.  Disposition: Patient does not meet criteria for psychiatric inpatient admission. Supportive therapy provided about ongoing stressors.  Alethia Berthold, MD 12/27/2015 3:40 PM

## 2015-12-27 NOTE — Progress Notes (Signed)
Pt noted to have fever of 101.4. Spoke with MD and order for tylenol was placed. Will give tylenol and continue to monitor.

## 2015-12-27 NOTE — Progress Notes (Signed)
Gracey visited PT on RT visit. PT was alert and able to engage but did have some processing difficulty. CH offered support and prayer. PT did not want prayer at this time

## 2015-12-28 DIAGNOSIS — J9601 Acute respiratory failure with hypoxia: Secondary | ICD-10-CM | POA: Diagnosis not present

## 2015-12-28 DIAGNOSIS — F609 Personality disorder, unspecified: Secondary | ICD-10-CM | POA: Diagnosis not present

## 2015-12-28 DIAGNOSIS — K219 Gastro-esophageal reflux disease without esophagitis: Secondary | ICD-10-CM | POA: Diagnosis not present

## 2015-12-28 DIAGNOSIS — F419 Anxiety disorder, unspecified: Secondary | ICD-10-CM | POA: Diagnosis not present

## 2015-12-28 DIAGNOSIS — J45909 Unspecified asthma, uncomplicated: Secondary | ICD-10-CM | POA: Diagnosis not present

## 2015-12-28 DIAGNOSIS — I429 Cardiomyopathy, unspecified: Secondary | ICD-10-CM | POA: Diagnosis not present

## 2015-12-28 DIAGNOSIS — F39 Unspecified mood [affective] disorder: Secondary | ICD-10-CM | POA: Diagnosis not present

## 2015-12-28 DIAGNOSIS — R262 Difficulty in walking, not elsewhere classified: Secondary | ICD-10-CM | POA: Diagnosis not present

## 2015-12-28 DIAGNOSIS — N3281 Overactive bladder: Secondary | ICD-10-CM | POA: Diagnosis not present

## 2015-12-28 DIAGNOSIS — Z7401 Bed confinement status: Secondary | ICD-10-CM | POA: Diagnosis not present

## 2015-12-28 DIAGNOSIS — R5381 Other malaise: Secondary | ICD-10-CM | POA: Diagnosis not present

## 2015-12-28 DIAGNOSIS — J189 Pneumonia, unspecified organism: Secondary | ICD-10-CM | POA: Diagnosis not present

## 2015-12-28 DIAGNOSIS — E669 Obesity, unspecified: Secondary | ICD-10-CM | POA: Diagnosis not present

## 2015-12-28 DIAGNOSIS — M1040 Other secondary gout, unspecified site: Secondary | ICD-10-CM | POA: Diagnosis not present

## 2015-12-28 DIAGNOSIS — F200 Paranoid schizophrenia: Secondary | ICD-10-CM | POA: Diagnosis not present

## 2015-12-28 DIAGNOSIS — J449 Chronic obstructive pulmonary disease, unspecified: Secondary | ICD-10-CM | POA: Diagnosis not present

## 2015-12-28 DIAGNOSIS — J9602 Acute respiratory failure with hypercapnia: Secondary | ICD-10-CM | POA: Diagnosis not present

## 2015-12-28 DIAGNOSIS — E039 Hypothyroidism, unspecified: Secondary | ICD-10-CM | POA: Diagnosis not present

## 2015-12-28 DIAGNOSIS — T7840XD Allergy, unspecified, subsequent encounter: Secondary | ICD-10-CM | POA: Diagnosis not present

## 2015-12-28 DIAGNOSIS — M109 Gout, unspecified: Secondary | ICD-10-CM | POA: Diagnosis not present

## 2015-12-28 DIAGNOSIS — E7800 Pure hypercholesterolemia, unspecified: Secondary | ICD-10-CM | POA: Diagnosis not present

## 2015-12-28 DIAGNOSIS — E119 Type 2 diabetes mellitus without complications: Secondary | ICD-10-CM | POA: Diagnosis not present

## 2015-12-28 DIAGNOSIS — G4733 Obstructive sleep apnea (adult) (pediatric): Secondary | ICD-10-CM | POA: Diagnosis not present

## 2015-12-28 DIAGNOSIS — I10 Essential (primary) hypertension: Secondary | ICD-10-CM | POA: Diagnosis not present

## 2015-12-28 DIAGNOSIS — F319 Bipolar disorder, unspecified: Secondary | ICD-10-CM | POA: Diagnosis not present

## 2015-12-28 DIAGNOSIS — E785 Hyperlipidemia, unspecified: Secondary | ICD-10-CM | POA: Diagnosis not present

## 2015-12-28 DIAGNOSIS — Z741 Need for assistance with personal care: Secondary | ICD-10-CM | POA: Diagnosis not present

## 2015-12-28 DIAGNOSIS — G4730 Sleep apnea, unspecified: Secondary | ICD-10-CM | POA: Diagnosis not present

## 2015-12-28 DIAGNOSIS — J208 Acute bronchitis due to other specified organisms: Secondary | ICD-10-CM | POA: Diagnosis not present

## 2015-12-28 DIAGNOSIS — J9610 Chronic respiratory failure, unspecified whether with hypoxia or hypercapnia: Secondary | ICD-10-CM | POA: Diagnosis not present

## 2015-12-28 DIAGNOSIS — R609 Edema, unspecified: Secondary | ICD-10-CM | POA: Diagnosis not present

## 2015-12-28 DIAGNOSIS — J45901 Unspecified asthma with (acute) exacerbation: Secondary | ICD-10-CM | POA: Diagnosis not present

## 2015-12-28 DIAGNOSIS — F209 Schizophrenia, unspecified: Secondary | ICD-10-CM | POA: Diagnosis not present

## 2015-12-28 DIAGNOSIS — H409 Unspecified glaucoma: Secondary | ICD-10-CM | POA: Diagnosis not present

## 2015-12-28 DIAGNOSIS — R600 Localized edema: Secondary | ICD-10-CM | POA: Diagnosis not present

## 2015-12-28 DIAGNOSIS — R635 Abnormal weight gain: Secondary | ICD-10-CM | POA: Diagnosis not present

## 2015-12-28 DIAGNOSIS — F201 Disorganized schizophrenia: Secondary | ICD-10-CM | POA: Diagnosis not present

## 2015-12-28 DIAGNOSIS — E662 Morbid (severe) obesity with alveolar hypoventilation: Secondary | ICD-10-CM | POA: Diagnosis not present

## 2015-12-28 LAB — GLUCOSE, CAPILLARY
GLUCOSE-CAPILLARY: 78 mg/dL (ref 65–99)
Glucose-Capillary: 101 mg/dL — ABNORMAL HIGH (ref 65–99)

## 2015-12-28 LAB — LITHIUM LEVEL: Lithium Lvl: 0.47 mmol/L — ABNORMAL LOW (ref 0.60–1.20)

## 2015-12-28 LAB — VALPROIC ACID LEVEL: VALPROIC ACID LVL: 79 ug/mL (ref 50.0–100.0)

## 2015-12-28 LAB — AMMONIA: AMMONIA: 47 umol/L — AB (ref 9–35)

## 2015-12-28 MED ORDER — COLCHICINE 0.6 MG PO TABS: 0.6000 mg | ORAL_TABLET | Freq: Two times a day (BID) | ORAL | Status: DC

## 2015-12-28 MED ORDER — COLCHICINE 0.6 MG PO TABS
1.2000 mg | ORAL_TABLET | Freq: Once | ORAL | Status: AC
  Administered 2015-12-28: 1.2 mg via ORAL
  Filled 2015-12-28: qty 2

## 2015-12-28 MED ORDER — PREDNISONE 50 MG PO TABS
50.0000 mg | ORAL_TABLET | Freq: Every day | ORAL | Status: DC
  Administered 2015-12-28: 50 mg via ORAL
  Filled 2015-12-28: qty 1

## 2015-12-28 MED ORDER — COLCHICINE 0.6 MG PO TABS
0.6000 mg | ORAL_TABLET | Freq: Once | ORAL | Status: AC
  Administered 2015-12-28: 0.6 mg via ORAL
  Filled 2015-12-28: qty 1

## 2015-12-28 MED ORDER — PREDNISONE 10 MG PO TABS: ORAL_TABLET | ORAL | 0 refills | Status: DC

## 2015-12-28 NOTE — Clinical Social Work Placement (Signed)
   CLINICAL SOCIAL WORK PLACEMENT  NOTE  Date:  12/28/2015  Patient Details  Name: John [name]. MRN: [id number] Date of Birth: Dec 18, 1975  Clinical Social Work is seeking post-discharge placement for this patient at the [place] level of care (*CSW will initial, date and re-position this form in  chart as items are completed):  Yes   Patient/family provided with [facility] Work Department's list of facilities offering this level of care within the geographic area requested by the patient (or if unable, by the patient's family).  Yes   Patient/family informed of their freedom to choose among providers that offer the needed level of care, that participate in Medicare, Medicaid or managed care program needed by the patient, have an available bed and are willing to accept the patient.  Yes   Patient/family informed of [facility]'s ownership interest in [facility] and [facility], as well as of the fact that they are under no obligation to receive care at these facilities.  PASRR submitted to EDS on 12/26/15     PASRR number received on 12/27/15     Existing PASRR number confirmed on       FL2 transmitted to all facilities in geographic area requested by pt/family on 12/26/15     FL2 transmitted to all facilities within larger geographic area on       Patient informed that his/her managed care company has contracts with or will negotiate with certain facilities, including the following:        Yes   Patient/family informed of bed offers received.  Patient chooses bed at  [facility] )     Physician recommends and patient chooses bed at      Patient to be transferred to  [facility] ) on 12/28/15.  Patient to be transferred to facility by  [facility] )     Patient family notified on 12/28/15 of transfer.  Name of family member notified:   (Patinet's mother [name] is aware of D/C today. )     PHYSICIAN      Additional Comment:    _______________________________________________ Shermaine Brigham, Veronia Beets, LCSW 12/28/2015, 1:37 PM

## 2015-12-28 NOTE — Discharge Instructions (Signed)
Heart healthy and ADA diet. BIPAP at night.

## 2015-12-28 NOTE — Progress Notes (Signed)
Patient is medically stable for D/C to [facility] today. Per [facility] admissions coordinator at [place] they can get a Bi-pap for patient today and he will go to room 1-B. RN will call report and arrange EMS for transport. Clinical Education officer, museum (CSW) sent D/C orders to [facility] via [employer]. Patient is aware of above. CSW contacted patient's mother John [name] and made her aware of above. CSW also contacted patient's group home owner John [name] and made him aware of above. Please reconsult if future social work needs arise. CSW signing off.   [employer], LCSW [phone]

## 2015-12-28 NOTE — Discharge Summary (Signed)
[facility] NAME: John [name]    MR#:  [id number]  DATE OF BIRTH:  03-Nov-1975  DATE OF ADMISSION:  12/20/2015   ADMITTING PHYSICIAN: Fritzi Mandes, MD  DATE OF DISCHARGE: 12/28/2015 PRIMARY CARE PHYSICIAN: MASOUD,JAVED, MD   ADMISSION DIAGNOSIS:  Hypercarbia [R06.89] Hypoxia [R09.02] Multifocal pneumonia [J18.9] Dyspnea, unspecified type [R06.00] DISCHARGE DIAGNOSIS:  Active Problems:   Paranoid schizophrenia, chronic condition ([place])   Cardiomyopathy ([place])   Respiratory failure ([place]) Acute respiratory failure with hypoxia and hypercapnia. Multifocal pneumonia (HAP) gout SECONDARY DIAGNOSIS:   Past Medical History:  Diagnosis Date  . Anemia   . Asthma   . Diabetes mellitus   . Morbid obesity ([place])   . Paranoid schizophrenia ([place])   . Personality disorder   . Sleep apnea   . Thyroid disease    HOSPITAL COURSE:  Acute respiratory failure with hypoxia and hypercapnia.  on BIPAP at night and off O2.  continue NEB.  Multifocal pneumonia (HAP) disontinued rocephin, changed to Levaquin po (2 more days).  COPD and OSA.  As above. BIPAP at night.  HTN. Controlled. Continue Atenolol/losartan  DM2, SL ACHS.  paranoid schizophrenia/Personality disorder Continue Depakote/lithium carbonate, tapered klonopin. restarted on clozaril 50 mg qhs.  titrate up daily until back on 200 mg per Psych consult.  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 Marry Guan. Left ankle pain due to gout. Check x-ray: no facture. Pain control. Uric acid 8.5. Start prednisone taper 7 days.   DISCHARGE CONDITIONS:  Stable, discharge to SNF today. CONSULTS OBTAINED:  Treatment Team:  Dereck Leep, MD Lenward Chancellor, MD [name], MD DRUG ALLERGIES:  No Known Allergies DISCHARGE MEDICATIONS:   Allergies as of 12/28/2015   No Known Allergies     Medication List    TAKE these  medications   Armodafinil 250 MG tablet Take 250 mg by mouth daily.   atenolol 100 MG tablet Commonly known as:  TENORMIN Take 100 mg by mouth daily.   budesonide-formoterol 160-4.5 MCG/ACT inhaler Commonly known as:  SYMBICORT Inhale 1 puff into the lungs 2 (two) times daily.   clobetasol cream 0.05 % Commonly known as:  TEMOVATE Apply 1 application topically 2 (two) times daily.   clonazePAM 0.5 MG tablet Commonly known as:  KLONOPIN Take 0.5 mg by mouth 3 (three) times daily.   clozapine 200 MG tablet Commonly known as:  CLOZARIL Take 1 tablet (200 mg total) by mouth daily. What changed:  when to take this   divalproex 500 MG 24 hr tablet Commonly known as:  DEPAKOTE ER Take 2,000 mg by mouth at bedtime.   fluocinolone 0.01 % external solution Commonly known as:  SYNALAR Apply 1 mL topically 2 (two) times daily. Apply to scalp, neck and ears.   glipiZIDE 5 MG 24 hr tablet Commonly known as:  GLUCOTROL XL Take 5 mg by mouth 2 (two) times daily.   latanoprost 0.005 % ophthalmic solution Commonly known as:  XALATAN Place 1 drop into both eyes at bedtime.   levofloxacin 750 MG tablet Commonly known as:  LEVAQUIN Take 1 tablet (750 mg total) by mouth daily.   levothyroxine 75 MCG tablet Commonly known as:  SYNTHROID, LEVOTHROID Take 75 mcg by mouth daily.   lithium carbonate 450 MG CR tablet Commonly known as:  ESKALITH Take 450 mg by mouth at bedtime.   loratadine 10 MG tablet Commonly known as:  CLARITIN  Take 10 mg by mouth daily.   losartan 50 MG tablet Commonly known as:  COZAAR Take 1 tablet (50 mg total) by mouth daily.   meloxicam 7.5 MG tablet Commonly known as:  MOBIC Take 7.5 mg by mouth daily.   metFORMIN 1000 MG tablet Commonly known as:  GLUCOPHAGE Take 1,000 mg by mouth 2 (two) times daily with a meal.   multivitamins ther. w/minerals Tabs tablet Take 1 tablet by mouth daily.   omeprazole 20 MG capsule Commonly known as:   PRILOSEC Take 20 mg by mouth daily.   oxybutynin 5 MG tablet Commonly known as:  DITROPAN Take 5 mg by mouth daily.   predniSONE 10 MG tablet Commonly known as:  DELTASONE 40 mg po daily for 2 days, 20 mg po daily for 2 days, 10 mg po daily for 3 days. What changed:  medication strength  how much to take  how to take this  when to take this  additional instructions   simvastatin 20 MG tablet Commonly known as:  ZOCOR Take 20 mg by mouth at bedtime.   triamcinolone cream 0.1 % Commonly known as:  KENALOG Apply 1 application topically 2 (two) times daily.        DISCHARGE INSTRUCTIONS:  See AVS. If you experience worsening of your admission symptoms, develop shortness of breath, life threatening emergency, suicidal or homicidal thoughts you must seek medical attention immediately by calling 911 or calling your MD immediately  if symptoms less severe.  You Must read complete instructions/literature along with all the possible adverse reactions/side effects for all the Medicines you take and that have been prescribed to you. Take any new Medicines after you have completely understood and accpet all the possible adverse reactions/side effects.   Please note  You were cared for by a hospitalist during your hospital stay. If you have any questions about your discharge medications or the care you received while you were in the hospital after you are discharged, you can call the unit and asked to speak with the hospitalist on call if the hospitalist that took care of you is not available. Once you are discharged, your primary care physician will handle any further medical issues. Please note that NO REFILLS for any discharge medications will be authorized once you are discharged, as it is imperative that you return to your primary care physician (or establish a relationship with a primary care physician if you do not have one) for your aftercare needs so that they can reassess your  need for medications and monitor your lab values.    On the day of Discharge:  VITAL SIGNS:  Blood pressure 128/72, pulse 98, temperature 98.3 F (36.8 C), temperature source Oral, resp. rate 12, height 6\' 2"  (1.88 m), weight (!) 324 lb 1.6 oz (147 kg), SpO2 94 %. PHYSICAL EXAMINATION:  GENERAL:  40 y.o.-year-old patient lying in the bed with no acute distress. Morbid obese. EYES: Pupils equal, round, reactive to light and accommodation. No scleral icterus. Extraocular muscles intact.  HEENT: Head atraumatic, normocephalic. Oropharynx and nasopharynx clear.  NECK:  Supple, no jugular venous distention. No thyroid enlargement, no tenderness.  LUNGS: Normal breath sounds bilaterally, no wheezing, rales,rhonchi or crepitation. No use of accessory muscles of respiration.  CARDIOVASCULAR: S1, S2 normal. No murmurs, rubs, or gallops.  ABDOMEN: Soft, non-tender, non-distended. Bowel sounds present. No organomegaly or mass.  EXTREMITIES: No pedal edema, cyanosis, or clubbing.  NEUROLOGIC: Cranial nerves II through XII are intact. Muscle strength 5/5  in all extremities. Sensation intact. Gait not checked.  PSYCHIATRIC: The patient is alert and oriented x 3.  SKIN: No obvious rash, lesion, or ulcer.  DATA REVIEW:   CBC  Recent Labs Lab 12/26/15 1340  WBC 9.0  HGB 12.3*  HCT 37.9*  PLT 206    Chemistries   Recent Labs Lab 12/26/15 1340 12/27/15 0453  CREATININE  --  0.89  AST 15  --   ALT 13*  --   ALKPHOS 37*  --   BILITOT 0.6  --      Microbiology Results  Results for orders placed or performed during the hospital encounter of 12/20/15  MRSA PCR Screening     Status: None   Collection Time: 12/20/15  9:36 AM  Result Value Ref Range Status   MRSA by PCR NEGATIVE NEGATIVE Final    Comment:        The GeneXpert MRSA Assay (FDA approved for NASAL specimens only), is one component of a comprehensive MRSA colonization surveillance program. It is not intended to diagnose  MRSA infection nor to guide or monitor treatment for MRSA infections.   Blood culture (routine x 2)     Status: None   Collection Time: 12/20/15  6:28 PM  Result Value Ref Range Status   Specimen Description BLOOD  R AC  Final   Special Requests BOTTLES DRAWN AEROBIC AND ANAEROBIC ANA 7 AER 9 ML  Final   Culture NO GROWTH 5 DAYS  Final   Report Status 12/25/2015 FINAL  Final  Blood culture (routine x 2)     Status: None   Collection Time: 12/20/15  6:29 PM  Result Value Ref Range Status   Specimen Description BLOOD L AC  Final   Special Requests   Final    BOTTLES DRAWN AEROBIC AND ANAEROBIC  AER 5 ANA 5 ML   Culture NO GROWTH 5 DAYS  Final   Report Status 12/25/2015 FINAL  Final    RADIOLOGY:  No results found.   Management plans discussed with the patient, family and they are in agreement.  CODE STATUS:     Code Status Orders        Start     Ordered   12/20/15 1613  Full code  Continuous     12/20/15 1614    Code Status History    Date Active Date Inactive Code Status Order ID Comments User Context   11/25/2015 10:37 AM 11/28/2015  9:34 PM Full Code [id number]  Robbie Lis, MD Inpatient      TOTAL TIME TAKING CARE OF THIS PATIENT: 38 minutes.    Demetrios Loll M.D on 12/28/2015 at 10:13 AM  Between 7am to 6pm - Pager - [phone]  After 6pm go to [email or web address] - Proofreader  [facility]  Office  [phone]  CC: Primary care physician; Cletis Athens, MD   Note: This dictation was prepared with Dragon dictation along with smaller phrase technology. Any transcriptional errors that result from this process are unintentional.

## 2015-12-28 NOTE — Care Management Important Message (Signed)
Important Message  Patient Details  Name: John [name]. MRN: [id number] Date of Birth: 05-03-1975   Medicare Important Message Given:  Yes    Jolly Mango, RN 12/28/2015, 10:57 AM

## 2015-12-28 NOTE — Progress Notes (Signed)
Removed PIV.  Called report to [facility].  Called EMS to pick up patient to take to facility.

## 2015-12-28 NOTE — Progress Notes (Addendum)
Went in to see patient this morning. Patient sleeping very soundly. On  Ventilation assistance for sleep apnea. Patient was not able to be aroused with  communication or even rubbing of the lower leg. We'll check back on patient to see how he is  doing this morning. Appears patient probably had a uric acid attack. Pain level according to nurses documentation was no pain. We'll see how he does with physical therapy today.

## 2015-12-29 DIAGNOSIS — G4733 Obstructive sleep apnea (adult) (pediatric): Secondary | ICD-10-CM | POA: Diagnosis not present

## 2015-12-29 DIAGNOSIS — J189 Pneumonia, unspecified organism: Secondary | ICD-10-CM | POA: Diagnosis not present

## 2015-12-29 DIAGNOSIS — J449 Chronic obstructive pulmonary disease, unspecified: Secondary | ICD-10-CM | POA: Diagnosis not present

## 2015-12-29 DIAGNOSIS — M109 Gout, unspecified: Secondary | ICD-10-CM | POA: Diagnosis not present

## 2015-12-29 DIAGNOSIS — T7840XD Allergy, unspecified, subsequent encounter: Secondary | ICD-10-CM | POA: Diagnosis not present

## 2015-12-29 DIAGNOSIS — E119 Type 2 diabetes mellitus without complications: Secondary | ICD-10-CM | POA: Diagnosis not present

## 2015-12-29 DIAGNOSIS — E785 Hyperlipidemia, unspecified: Secondary | ICD-10-CM | POA: Diagnosis not present

## 2015-12-29 DIAGNOSIS — E039 Hypothyroidism, unspecified: Secondary | ICD-10-CM | POA: Diagnosis not present

## 2015-12-31 DIAGNOSIS — J189 Pneumonia, unspecified organism: Secondary | ICD-10-CM | POA: Diagnosis not present

## 2015-12-31 DIAGNOSIS — J9602 Acute respiratory failure with hypercapnia: Secondary | ICD-10-CM | POA: Diagnosis not present

## 2015-12-31 DIAGNOSIS — F319 Bipolar disorder, unspecified: Secondary | ICD-10-CM | POA: Diagnosis not present

## 2016-01-10 ENCOUNTER — Ambulatory Visit: Payer: Medicare Other | Admitting: Internal Medicine

## 2016-01-10 ENCOUNTER — Encounter: Payer: Self-pay | Admitting: *Deleted

## 2016-01-10 NOTE — Progress Notes (Deleted)
Follow-up Outpatient Visit Date: 01/10/2016  Chief Complaint: ***  HPI:  John [name] is a 41 y.o. year-old male with history of DM, asthma, anemia, sleep apnea, morbid obesity, paranoid schizophrenia, and tobacco use, who presents for follow-up of shortness of breath and cardiomyopathy.  The patient was seen as an initial consult by Dr. Angelena Form at [facility] on 11/25/15.  He was admitted at that time with acute hypoxic respiratory failure due to a combination of asthma, OSA, possible OHS, and questionable component of heart failure.  LVEF was found to be mildly reduced with moderate RV dysfunction.  --------------------------------------------------------------------------------------------------  Cardiovascular History & Procedures: Cardiovascular Problems:  Heart failure  Risk Factors:  DM, male gender, tobacco use  Cath/PCI:  None  CV Surgery:  None  EP Procedures and Devices:  None  Non-Invasive Evaluation(s):  TTE (11/25/15): Mildly reduced LV function (EF 45-50%) with global hypokinesis. Mild left atrial enlargement. Moderately dilated RV with moderately reduced contraction. No significant valvular abnormalities. Elevated central venous pressure.  Recent CV Pertinent Labs: Lab Results  Component Value Date   CHOL 146 11/24/2013   HDL 22 (L) 11/24/2013   LDLCALC 94 11/24/2013   TRIG 152 11/24/2013   INR 1.14 11/25/2015   BNP 55.0 12/20/2015   K 4.1 12/21/2015   K 4.3 04/09/2014   MG 1.9 12/21/2015   BUN 8 12/21/2015   BUN 8 04/09/2014   CREATININE 0.89 12/27/2015   CREATININE 0.93 04/09/2014     Past medical and surgical history were reviewed and updated in EPIC.   Outpatient Encounter Prescriptions as of 01/10/2016  Medication Sig  . Armodafinil 250 MG tablet Take 250 mg by mouth daily.  Marland Kitchen atenolol (TENORMIN) 100 MG tablet Take 100 mg by mouth daily.  . budesonide-formoterol (SYMBICORT) 160-4.5 MCG/ACT inhaler Inhale 1 puff into the lungs 2 (two)  times daily.  . clobetasol cream (TEMOVATE) [id number] % Apply 1 application topically 2 (two) times daily.  . clonazePAM (KLONOPIN) 0.5 MG tablet Take 0.5 mg by mouth 3 (three) times daily.  . clozapine (CLOZARIL) 200 MG tablet Take 1 tablet (200 mg total) by mouth daily. (Patient taking differently: Take 200 mg by mouth at bedtime. )  . divalproex (DEPAKOTE ER) 500 MG 24 hr tablet Take 2,000 mg by mouth at bedtime.   . fluocinolone (SYNALAR) 0.01 % external solution Apply 1 mL topically 2 (two) times daily. Apply to scalp, neck and ears.  Marland Kitchen glipiZIDE (GLUCOTROL XL) 5 MG 24 hr tablet Take 5 mg by mouth 2 (two) times daily.  Marland Kitchen latanoprost (XALATAN) 0.005 % ophthalmic solution Place 1 drop into both eyes at bedtime.  Marland Kitchen levofloxacin (LEVAQUIN) 750 MG tablet Take 1 tablet (750 mg total) by mouth daily.  Marland Kitchen levothyroxine (SYNTHROID, LEVOTHROID) 75 MCG tablet Take 75 mcg by mouth daily.  Marland Kitchen lithium carbonate (ESKALITH) 450 MG CR tablet Take 450 mg by mouth at bedtime.  Marland Kitchen loratadine (CLARITIN) 10 MG tablet Take 10 mg by mouth daily.  Marland Kitchen losartan (COZAAR) 50 MG tablet Take 1 tablet (50 mg total) by mouth daily.  . meloxicam (MOBIC) 7.5 MG tablet Take 7.5 mg by mouth daily.  . metFORMIN (GLUCOPHAGE) 1000 MG tablet Take 1,000 mg by mouth 2 (two) times daily with a meal.  . Multiple Vitamins-Minerals (MULTIVITAMINS THER. W/MINERALS) TABS tablet Take 1 tablet by mouth daily.  Marland Kitchen omeprazole (PRILOSEC) 20 MG capsule Take 20 mg by mouth daily.  Marland Kitchen oxybutynin (DITROPAN) 5 MG tablet Take 5 mg by mouth daily.  Marland Kitchen  predniSONE (DELTASONE) 10 MG tablet 40 mg po daily for 2 days, 20 mg po daily for 2 days, 10 mg po daily for 3 days.  . simvastatin (ZOCOR) 20 MG tablet Take 20 mg by mouth at bedtime.   . triamcinolone cream (KENALOG) 0.1 % Apply 1 application topically 2 (two) times daily.   No facility-administered encounter medications on file as of 01/10/2016.     Allergies: Patient has no known allergies.  Social History     Social History  . Marital status: Single    Spouse name: N/A  . Number of children: N/A  . Years of education: N/A   Occupational History  . Not on file.   Social History Main Topics  . Smoking status: Light Tobacco Smoker  . Smokeless tobacco: Never Used  . Alcohol use Yes     Comment: only once in a while  . Drug use: No  . Sexual activity: Not on file   Other Topics Concern  . Not on file   Social History Narrative  . No narrative on file    Family History  Problem Relation Age of Onset  . Glaucoma Mother     Review of Systems: A 12-system review of systems was performed and was negative except as noted in the HPI.  --------------------------------------------------------------------------------------------------  Physical Exam: There were no vitals taken for this visit.  General:  *** HEENT: No conjunctival pallor or scleral icterus.  Moist mucous membranes.  OP clear. Neck: Supple without lymphadenopathy, thyromegaly, JVD, or HJR.  No carotid bruit. Lungs: Normal work of breathing.  Clear to auscultation bilaterally without wheezes or crackles. Heart: Regular rate and rhythm without murmurs, rubs, or gallops.  Non-displaced PMI. Abd: Bowel sounds present.  Soft, NT/ND without hepatosplenomegaly Ext: No lower extremity edema.  Radial, PT, and DP pulses are 2+ bilaterally. Skin: warm and dry without rash  EKG:  ***  Lab Results  Component Value Date   WBC 9.0 12/26/2015   HGB 12.3 (L) 12/26/2015   HCT 37.9 (L) 12/26/2015   MCV 81.5 12/26/2015   PLT 206 12/26/2015    Lab Results  Component Value Date   NA 142 12/21/2015   K 4.1 12/21/2015   CL 98 (L) 12/21/2015   CO2 38 (H) 12/21/2015   BUN 8 12/21/2015   CREATININE 0.89 12/27/2015   GLUCOSE 92 12/21/2015   ALT 13 (L) 12/26/2015    Lab Results  Component Value Date   CHOL 146 11/24/2013   HDL 22 (L) 11/24/2013   LDLCALC 94 11/24/2013   TRIG 152 11/24/2013     --------------------------------------------------------------------------------------------------  ASSESSMENT AND PLAN: John Gave Chinaza Rooke, MD 01/10/2016 8:59 AM

## 2016-01-25 DIAGNOSIS — J9610 Chronic respiratory failure, unspecified whether with hypoxia or hypercapnia: Secondary | ICD-10-CM | POA: Diagnosis not present

## 2016-01-25 DIAGNOSIS — E669 Obesity, unspecified: Secondary | ICD-10-CM | POA: Diagnosis not present

## 2016-01-25 DIAGNOSIS — J45909 Unspecified asthma, uncomplicated: Secondary | ICD-10-CM | POA: Diagnosis not present

## 2016-01-25 DIAGNOSIS — M109 Gout, unspecified: Secondary | ICD-10-CM | POA: Diagnosis not present

## 2016-01-25 DIAGNOSIS — E119 Type 2 diabetes mellitus without complications: Secondary | ICD-10-CM | POA: Diagnosis not present

## 2016-01-25 DIAGNOSIS — E039 Hypothyroidism, unspecified: Secondary | ICD-10-CM | POA: Diagnosis not present

## 2016-01-25 DIAGNOSIS — I429 Cardiomyopathy, unspecified: Secondary | ICD-10-CM | POA: Diagnosis not present

## 2016-01-25 DIAGNOSIS — J449 Chronic obstructive pulmonary disease, unspecified: Secondary | ICD-10-CM | POA: Diagnosis not present

## 2016-02-04 DIAGNOSIS — J208 Acute bronchitis due to other specified organisms: Secondary | ICD-10-CM | POA: Diagnosis not present

## 2016-02-04 DIAGNOSIS — E662 Morbid (severe) obesity with alveolar hypoventilation: Secondary | ICD-10-CM | POA: Diagnosis not present

## 2016-02-09 DIAGNOSIS — G4733 Obstructive sleep apnea (adult) (pediatric): Secondary | ICD-10-CM | POA: Diagnosis not present

## 2016-02-21 DIAGNOSIS — G4733 Obstructive sleep apnea (adult) (pediatric): Secondary | ICD-10-CM | POA: Diagnosis not present

## 2016-02-21 DIAGNOSIS — T7840XD Allergy, unspecified, subsequent encounter: Secondary | ICD-10-CM | POA: Diagnosis not present

## 2016-02-21 DIAGNOSIS — E669 Obesity, unspecified: Secondary | ICD-10-CM | POA: Diagnosis not present

## 2016-02-21 DIAGNOSIS — M109 Gout, unspecified: Secondary | ICD-10-CM | POA: Diagnosis not present

## 2016-02-21 DIAGNOSIS — E039 Hypothyroidism, unspecified: Secondary | ICD-10-CM | POA: Diagnosis not present

## 2016-02-21 DIAGNOSIS — J449 Chronic obstructive pulmonary disease, unspecified: Secondary | ICD-10-CM | POA: Diagnosis not present

## 2016-02-21 DIAGNOSIS — I10 Essential (primary) hypertension: Secondary | ICD-10-CM | POA: Diagnosis not present

## 2016-02-21 DIAGNOSIS — E119 Type 2 diabetes mellitus without complications: Secondary | ICD-10-CM | POA: Diagnosis not present

## 2016-03-05 DIAGNOSIS — F419 Anxiety disorder, unspecified: Secondary | ICD-10-CM | POA: Diagnosis not present

## 2016-03-05 DIAGNOSIS — F201 Disorganized schizophrenia: Secondary | ICD-10-CM | POA: Diagnosis not present

## 2016-03-05 DIAGNOSIS — F39 Unspecified mood [affective] disorder: Secondary | ICD-10-CM | POA: Diagnosis not present

## 2016-03-08 DIAGNOSIS — E662 Morbid (severe) obesity with alveolar hypoventilation: Secondary | ICD-10-CM | POA: Diagnosis not present

## 2016-03-08 DIAGNOSIS — R600 Localized edema: Secondary | ICD-10-CM | POA: Diagnosis not present

## 2016-03-08 DIAGNOSIS — R5381 Other malaise: Secondary | ICD-10-CM | POA: Diagnosis not present

## 2016-03-12 DIAGNOSIS — E662 Morbid (severe) obesity with alveolar hypoventilation: Secondary | ICD-10-CM | POA: Diagnosis not present

## 2016-03-12 DIAGNOSIS — R600 Localized edema: Secondary | ICD-10-CM | POA: Diagnosis not present

## 2016-03-12 DIAGNOSIS — E119 Type 2 diabetes mellitus without complications: Secondary | ICD-10-CM | POA: Diagnosis not present

## 2016-03-12 DIAGNOSIS — R635 Abnormal weight gain: Secondary | ICD-10-CM | POA: Diagnosis not present

## 2016-03-15 DIAGNOSIS — R600 Localized edema: Secondary | ICD-10-CM | POA: Diagnosis not present

## 2016-03-15 DIAGNOSIS — R635 Abnormal weight gain: Secondary | ICD-10-CM | POA: Diagnosis not present

## 2016-03-15 DIAGNOSIS — E119 Type 2 diabetes mellitus without complications: Secondary | ICD-10-CM | POA: Diagnosis not present

## 2016-03-15 DIAGNOSIS — E662 Morbid (severe) obesity with alveolar hypoventilation: Secondary | ICD-10-CM | POA: Diagnosis not present

## 2016-03-19 DIAGNOSIS — R600 Localized edema: Secondary | ICD-10-CM | POA: Diagnosis not present

## 2016-03-19 DIAGNOSIS — R635 Abnormal weight gain: Secondary | ICD-10-CM | POA: Diagnosis not present

## 2016-03-19 DIAGNOSIS — E662 Morbid (severe) obesity with alveolar hypoventilation: Secondary | ICD-10-CM | POA: Diagnosis not present

## 2016-03-19 DIAGNOSIS — E119 Type 2 diabetes mellitus without complications: Secondary | ICD-10-CM | POA: Diagnosis not present

## 2016-03-29 DIAGNOSIS — R600 Localized edema: Secondary | ICD-10-CM | POA: Diagnosis not present

## 2016-03-29 DIAGNOSIS — E662 Morbid (severe) obesity with alveolar hypoventilation: Secondary | ICD-10-CM | POA: Diagnosis not present

## 2016-04-02 DIAGNOSIS — E662 Morbid (severe) obesity with alveolar hypoventilation: Secondary | ICD-10-CM | POA: Diagnosis not present

## 2016-04-02 DIAGNOSIS — R609 Edema, unspecified: Secondary | ICD-10-CM | POA: Diagnosis not present

## 2016-04-17 DIAGNOSIS — L309 Dermatitis, unspecified: Secondary | ICD-10-CM | POA: Diagnosis not present

## 2016-04-17 DIAGNOSIS — E1165 Type 2 diabetes mellitus with hyperglycemia: Secondary | ICD-10-CM | POA: Diagnosis not present

## 2016-04-17 DIAGNOSIS — L503 Dermatographic urticaria: Secondary | ICD-10-CM | POA: Diagnosis not present

## 2016-04-17 DIAGNOSIS — R918 Other nonspecific abnormal finding of lung field: Secondary | ICD-10-CM | POA: Diagnosis not present

## 2016-04-27 ENCOUNTER — Emergency Department: Payer: Medicare Other

## 2016-04-27 ENCOUNTER — Inpatient Hospital Stay
Admission: EM | Admit: 2016-04-27 | Discharge: 2016-04-28 | DRG: 872 | Disposition: A | Discharge status: Skilled Nursing Facility | Payer: Medicare Other | Attending: Internal Medicine | Admitting: Internal Medicine

## 2016-04-27 DIAGNOSIS — I959 Hypotension, unspecified: Secondary | ICD-10-CM | POA: Diagnosis present

## 2016-04-27 DIAGNOSIS — Z6841 Body Mass Index (BMI) 40.0 and over, adult: Secondary | ICD-10-CM

## 2016-04-27 DIAGNOSIS — E119 Type 2 diabetes mellitus without complications: Secondary | ICD-10-CM | POA: Diagnosis not present

## 2016-04-27 DIAGNOSIS — G4730 Sleep apnea, unspecified: Secondary | ICD-10-CM | POA: Diagnosis present

## 2016-04-27 DIAGNOSIS — A419 Sepsis, unspecified organism: Secondary | ICD-10-CM | POA: Diagnosis not present

## 2016-04-27 DIAGNOSIS — Z7984 Long term (current) use of oral hypoglycemic drugs: Secondary | ICD-10-CM | POA: Diagnosis not present

## 2016-04-27 DIAGNOSIS — F609 Personality disorder, unspecified: Secondary | ICD-10-CM | POA: Diagnosis present

## 2016-04-27 DIAGNOSIS — B368 Other specified superficial mycoses: Secondary | ICD-10-CM | POA: Diagnosis not present

## 2016-04-27 DIAGNOSIS — R531 Weakness: Secondary | ICD-10-CM

## 2016-04-27 DIAGNOSIS — Z87891 Personal history of nicotine dependence: Secondary | ICD-10-CM | POA: Diagnosis not present

## 2016-04-27 DIAGNOSIS — F200 Paranoid schizophrenia: Secondary | ICD-10-CM | POA: Diagnosis not present

## 2016-04-27 DIAGNOSIS — E860 Dehydration: Secondary | ICD-10-CM | POA: Diagnosis not present

## 2016-04-27 DIAGNOSIS — Z8701 Personal history of pneumonia (recurrent): Secondary | ICD-10-CM

## 2016-04-27 DIAGNOSIS — E162 Hypoglycemia, unspecified: Secondary | ICD-10-CM | POA: Diagnosis not present

## 2016-04-27 DIAGNOSIS — Z23 Encounter for immunization: Secondary | ICD-10-CM | POA: Diagnosis not present

## 2016-04-27 DIAGNOSIS — E11649 Type 2 diabetes mellitus with hypoglycemia without coma: Secondary | ICD-10-CM | POA: Diagnosis present

## 2016-04-27 DIAGNOSIS — E079 Disorder of thyroid, unspecified: Secondary | ICD-10-CM | POA: Diagnosis not present

## 2016-04-27 DIAGNOSIS — Z79899 Other long term (current) drug therapy: Secondary | ICD-10-CM | POA: Diagnosis not present

## 2016-04-27 DIAGNOSIS — E669 Obesity, unspecified: Secondary | ICD-10-CM | POA: Diagnosis not present

## 2016-04-27 DIAGNOSIS — K210 Gastro-esophageal reflux disease with esophagitis: Secondary | ICD-10-CM | POA: Diagnosis not present

## 2016-04-27 DIAGNOSIS — R42 Dizziness and giddiness: Secondary | ICD-10-CM | POA: Diagnosis not present

## 2016-04-27 DIAGNOSIS — R55 Syncope and collapse: Secondary | ICD-10-CM | POA: Diagnosis not present

## 2016-04-27 LAB — HEPATIC FUNCTION PANEL
ALT: 10 U/L — ABNORMAL LOW (ref 17–63)
AST: 21 U/L (ref 15–41)
Albumin: 3.7 g/dL (ref 3.5–5.0)
Alkaline Phosphatase: 44 U/L (ref 38–126)
Bilirubin, Direct: <0.1 mg/dL — ABNORMAL LOW (ref 0.1–0.5)
TOTAL PROTEIN: 7.4 g/dL (ref 6.5–8.1)
Total Bilirubin: 0.6 mg/dL (ref 0.3–1.2)

## 2016-04-27 LAB — URINE DRUG SCREEN, QUALITATIVE (ARMC ONLY)
AMPHETAMINES, UR SCREEN: NOT DETECTED
Barbiturates, Ur Screen: NOT DETECTED
Benzodiazepine, Ur Scrn: NOT DETECTED
CANNABINOID 50 NG, UR ~~LOC~~: NOT DETECTED
Cocaine Metabolite,Ur ~~LOC~~: NOT DETECTED
MDMA (Ecstasy)Ur Screen: NOT DETECTED
Methadone Scn, Ur: NOT DETECTED
Opiate, Ur Screen: NOT DETECTED
PHENCYCLIDINE (PCP) UR S: NOT DETECTED
Tricyclic, Ur Screen: NOT DETECTED

## 2016-04-27 LAB — PROCALCITONIN: Procalcitonin: <0.1 ng/mL

## 2016-04-27 LAB — URINALYSIS, COMPLETE (UACMP) WITH MICROSCOPIC
BACTERIA UA: NONE SEEN
Bilirubin Urine: NEGATIVE
Glucose, UA: NEGATIVE mg/dL
Hgb urine dipstick: NEGATIVE
Ketones, ur: NEGATIVE mg/dL
Nitrite: NEGATIVE
PROTEIN: 30 mg/dL — AB
SPECIFIC GRAVITY, URINE: 1.017 (ref 1.005–1.030)
pH: 5 (ref 5.0–8.0)

## 2016-04-27 LAB — BASIC METABOLIC PANEL
Anion gap: 10 (ref 5–15)
BUN: 16 mg/dL (ref 6–20)
CHLORIDE: 97 mmol/L — AB (ref 101–111)
CO2: 31 mmol/L (ref 22–32)
CREATININE: 1.4 mg/dL — AB (ref 0.61–1.24)
Calcium: 8.5 mg/dL — ABNORMAL LOW (ref 8.9–10.3)
GFR calc Af Amer: >60 mL/min (ref >60)
GFR calc non Af Amer: >60 mL/min (ref >60)
GLUCOSE: 64 mg/dL — AB (ref 65–99)
POTASSIUM: 3.4 mmol/L — AB (ref 3.5–5.1)
SODIUM: 138 mmol/L (ref 135–145)

## 2016-04-27 LAB — BLOOD GAS, VENOUS
Acid-Base Excess: 7.8 mmol/L — ABNORMAL HIGH (ref 0.0–2.0)
Bicarbonate: 34.9 mmol/L — ABNORMAL HIGH (ref 20.0–28.0)
O2 SAT: 81.7 %
PATIENT TEMPERATURE: 37
PCO2 VEN: 59 mmHg (ref 44.0–60.0)
PO2 VEN: 47 mmHg — AB (ref 32.0–45.0)
pH, Ven: 7.38 (ref 7.250–7.430)

## 2016-04-27 LAB — CBC
HEMATOCRIT: 39.6 % — AB (ref 40.0–52.0)
Hemoglobin: 12.9 g/dL — ABNORMAL LOW (ref 13.0–18.0)
MCH: 26.8 pg (ref 26.0–34.0)
MCHC: 32.6 g/dL (ref 32.0–36.0)
MCV: 82.3 fL (ref 80.0–100.0)
Platelets: 186 10*3/uL (ref 150–440)
RBC: 4.81 MIL/uL (ref 4.40–5.90)
RDW: 15.6 % — AB (ref 11.5–14.5)
WBC: 9.2 10*3/uL (ref 3.8–10.6)

## 2016-04-27 LAB — GLUCOSE, CAPILLARY
GLUCOSE-CAPILLARY: 48 mg/dL — AB (ref 65–99)
GLUCOSE-CAPILLARY: 74 mg/dL (ref 65–99)
GLUCOSE-CAPILLARY: 89 mg/dL (ref 65–99)
Glucose-Capillary: 47 mg/dL — ABNORMAL LOW (ref 65–99)
Glucose-Capillary: 121 mg/dL — ABNORMAL HIGH (ref 65–99)

## 2016-04-27 LAB — PROTIME-INR
INR: 1.2
PROTHROMBIN TIME: 15.3 s — AB (ref 11.4–15.2)

## 2016-04-27 LAB — SALICYLATE LEVEL

## 2016-04-27 LAB — ETHANOL: Alcohol, Ethyl (B): <5 mg/dL (ref <5)

## 2016-04-27 LAB — BRAIN NATRIURETIC PEPTIDE: B NATRIURETIC PEPTIDE 5: 72 pg/mL (ref 0.0–100.0)

## 2016-04-27 LAB — LACTIC ACID, PLASMA
LACTIC ACID, VENOUS: 2.2 mmol/L — AB (ref 0.5–1.9)
Lactic Acid, Venous: 1.5 mmol/L (ref 0.5–1.9)

## 2016-04-27 LAB — TROPONIN I: Troponin I: 0.03 ng/mL (ref <0.03)

## 2016-04-27 LAB — APTT: APTT: 35 s (ref 24–36)

## 2016-04-27 LAB — ACETAMINOPHEN LEVEL: Acetaminophen (Tylenol), Serum: <10 ug/mL — ABNORMAL LOW (ref 10–30)

## 2016-04-27 MED ORDER — SODIUM CHLORIDE 0.9 % IV BOLUS (SEPSIS)
1000.0000 mL | Freq: Once | INTRAVENOUS | Status: AC
  Administered 2016-04-27: 1000 mL via INTRAVENOUS

## 2016-04-27 MED ORDER — DEXTROSE 50 % IV SOLN
1.0000 | Freq: Once | INTRAVENOUS | Status: AC
  Administered 2016-04-27: 50 mL via INTRAVENOUS
  Filled 2016-04-27: qty 50

## 2016-04-27 MED ORDER — OXYBUTYNIN CHLORIDE 5 MG PO TABS
5.0000 mg | ORAL_TABLET | Freq: Every day | ORAL | Status: DC
  Administered 2016-04-28: 09:00:00 5 mg via ORAL
  Filled 2016-04-27: qty 1

## 2016-04-27 MED ORDER — CLOBETASOL PROPIONATE 0.05 % EX CREA
1.0000 "application " | TOPICAL_CREAM | Freq: Two times a day (BID) | CUTANEOUS | Status: DC | PRN
  Filled 2016-04-27: qty 15

## 2016-04-27 MED ORDER — PIPERACILLIN-TAZOBACTAM 3.375 G IVPB
3.3750 g | Freq: Three times a day (TID) | INTRAVENOUS | Status: DC
  Administered 2016-04-28: 3.375 g via INTRAVENOUS
  Filled 2016-04-27 (×2): qty 50

## 2016-04-27 MED ORDER — ARMODAFINIL 250 MG PO TABS: 250.0000 mg | ORAL_TABLET | Freq: Every day | ORAL | Status: DC

## 2016-04-27 MED ORDER — DIVALPROEX SODIUM ER 500 MG PO TB24
2000.0000 mg | ORAL_TABLET | Freq: Every day | ORAL | Status: DC
  Administered 2016-04-27: 22:00:00 2000 mg via ORAL
  Filled 2016-04-27: qty 4

## 2016-04-27 MED ORDER — PIPERACILLIN-TAZOBACTAM 3.375 G IVPB 30 MIN
3.3750 g | Freq: Once | INTRAVENOUS | Status: DC
  Filled 2016-04-27: qty 50

## 2016-04-27 MED ORDER — DEXTROSE-NACL 5-0.9 % IV SOLN: INTRAVENOUS | Status: DC

## 2016-04-27 MED ORDER — PIPERACILLIN-TAZOBACTAM 3.375 G IVPB 30 MIN
3.3750 g | Freq: Once | INTRAVENOUS | Status: AC
  Administered 2016-04-27: 3.375 g via INTRAVENOUS
  Filled 2016-04-27: qty 50

## 2016-04-27 MED ORDER — MELOXICAM 7.5 MG PO TABS
7.5000 mg | ORAL_TABLET | Freq: Every day | ORAL | Status: DC
  Administered 2016-04-28: 7.5 mg via ORAL
  Filled 2016-04-27: qty 1

## 2016-04-27 MED ORDER — MOMETASONE FURO-FORMOTEROL FUM 200-5 MCG/ACT IN AERO
2.0000 | INHALATION_SPRAY | Freq: Two times a day (BID) | RESPIRATORY_TRACT | Status: DC
  Administered 2016-04-27 – 2016-04-28 (×2): 2 via RESPIRATORY_TRACT
  Filled 2016-04-27: qty 8.8

## 2016-04-27 MED ORDER — LEVOTHYROXINE SODIUM 25 MCG PO TABS
75.0000 ug | ORAL_TABLET | Freq: Every day | ORAL | Status: DC
  Administered 2016-04-28: 75 ug via ORAL
  Filled 2016-04-27: qty 1

## 2016-04-27 MED ORDER — ENOXAPARIN SODIUM 40 MG/0.4ML ~~LOC~~ SOLN
40.0000 mg | Freq: Two times a day (BID) | SUBCUTANEOUS | Status: DC
  Administered 2016-04-27 – 2016-04-28 (×2): 40 mg via SUBCUTANEOUS
  Filled 2016-04-27 (×2): qty 0.4

## 2016-04-27 MED ORDER — IOPAMIDOL (ISOVUE-370) INJECTION 76%
75.0000 mL | Freq: Once | INTRAVENOUS | Status: AC | PRN
  Administered 2016-04-27: 75 mL via INTRAVENOUS

## 2016-04-27 MED ORDER — FUROSEMIDE 40 MG PO TABS
60.0000 mg | ORAL_TABLET | Freq: Two times a day (BID) | ORAL | Status: DC
  Administered 2016-04-28: 60 mg via ORAL
  Filled 2016-04-27: qty 1

## 2016-04-27 MED ORDER — PIPERACILLIN-TAZOBACTAM 3.375 G IVPB
3.3750 g | Freq: Three times a day (TID) | INTRAVENOUS | Status: DC
  Filled 2016-04-27 (×3): qty 50

## 2016-04-27 MED ORDER — VANCOMYCIN HCL IN DEXTROSE 1-5 GM/200ML-% IV SOLN
1000.0000 mg | Freq: Once | INTRAVENOUS | Status: DC
  Filled 2016-04-27: qty 200

## 2016-04-27 MED ORDER — PNEUMOCOCCAL VAC POLYVALENT 25 MCG/0.5ML IJ INJ
0.5000 mL | INJECTION | INTRAMUSCULAR | Status: AC
  Administered 2016-04-28: 0.5 mL via INTRAMUSCULAR
  Filled 2016-04-27: qty 0.5

## 2016-04-27 MED ORDER — SODIUM CHLORIDE 0.9 % IV SOLN
1500.0000 mg | Freq: Three times a day (TID) | INTRAVENOUS | Status: DC
  Filled 2016-04-27 (×2): qty 1500

## 2016-04-27 MED ORDER — VANCOMYCIN HCL IN DEXTROSE 1-5 GM/200ML-% IV SOLN
1000.0000 mg | Freq: Once | INTRAVENOUS | Status: AC
  Administered 2016-04-27: 1000 mg via INTRAVENOUS
  Filled 2016-04-27: qty 200

## 2016-04-27 MED ORDER — ATORVASTATIN CALCIUM 20 MG PO TABS
10.0000 mg | ORAL_TABLET | Freq: Every day | ORAL | Status: DC
  Administered 2016-04-28: 09:00:00 10 mg via ORAL
  Filled 2016-04-27: qty 1

## 2016-04-27 MED ORDER — MODAFINIL 100 MG PO TABS
200.0000 mg | ORAL_TABLET | Freq: Every day | ORAL | Status: DC
  Administered 2016-04-28: 200 mg via ORAL
  Filled 2016-04-27: qty 2

## 2016-04-27 MED ORDER — ADULT MULTIVITAMIN W/MINERALS CH
1.0000 | ORAL_TABLET | Freq: Every day | ORAL | Status: DC
Start: 2016-04-28 — End: 2016-04-28
  Administered 2016-04-28: 1 via ORAL
  Filled 2016-04-27: qty 1

## 2016-04-27 MED ORDER — LORATADINE 10 MG PO TABS
10.0000 mg | ORAL_TABLET | Freq: Every day | ORAL | Status: DC
  Administered 2016-04-28: 10 mg via ORAL
  Filled 2016-04-27: qty 1

## 2016-04-27 MED ORDER — LATANOPROST 0.005 % OP SOLN
1.0000 [drp] | Freq: Every day | OPHTHALMIC | Status: DC
  Administered 2016-04-27: 1 [drp] via OPHTHALMIC
  Filled 2016-04-27: qty 2.5

## 2016-04-27 MED ORDER — INSULIN ASPART 100 UNIT/ML ~~LOC~~ SOLN
0.0000 [IU] | Freq: Three times a day (TID) | SUBCUTANEOUS | Status: DC
  Administered 2016-04-28: 12:00:00 1 [IU] via SUBCUTANEOUS
  Filled 2016-04-27: qty 1

## 2016-04-27 MED ORDER — PANTOPRAZOLE SODIUM 40 MG PO TBEC
40.0000 mg | DELAYED_RELEASE_TABLET | Freq: Every day | ORAL | Status: DC
  Administered 2016-04-28: 09:00:00 40 mg via ORAL
  Filled 2016-04-27: qty 1

## 2016-04-27 MED ORDER — CLONAZEPAM 0.5 MG PO TABS: 0.5000 mg | ORAL_TABLET | Freq: Three times a day (TID) | ORAL | Status: DC | PRN

## 2016-04-27 MED ORDER — SODIUM CHLORIDE 0.9 % IV SOLN
INTRAVENOUS | Status: DC
  Administered 2016-04-27 – 2016-04-28 (×2): via INTRAVENOUS

## 2016-04-27 MED ORDER — SODIUM CHLORIDE 0.9 % IV SOLN
1500.0000 mg | Freq: Three times a day (TID) | INTRAVENOUS | Status: DC
  Administered 2016-04-28 (×2): 1500 mg via INTRAVENOUS
  Filled 2016-04-27 (×2): qty 1500

## 2016-04-27 NOTE — ED Notes (Signed)
Date and time results received: 04/27/16 1418   Test: Lactic Critical Value: 2.2  Name of Provider Notified: Burlene Arnt

## 2016-04-27 NOTE — ED Notes (Signed)
Pt eating crackers, drinking juice. Explanation of admission process provided to pt who verbalizes understanding.

## 2016-04-27 NOTE — ED Provider Notes (Signed)
-----------------------------------------   5:22 PM on 04/27/2016 -----------------------------------------  Patient care assumed from Dr. Burlene Arnt. Patient essentially presents from his group facility for weakness found to be hypotensive. Initially with a blood glucose of 64 however approximately one hour later found to have a fingerstick blood glucose of 47. Patient was given D50. Patient is receiving IV fluids remains hypotensive with a current systolic blood pressure of 95. Patient's lactic acid has returned slightly elevated, patient has been placed on vancomycin and Zosyn. The patient appears to take glipizide for his diabetes so given his hypoglycemia on glipizide with weakness and hypotension the patient will be admitted to the hospital for further workup and treatment. Currently the patient is awake alert, denies any complaints at this time sitting in bed eating a meal.  CT head negative. CT chest with mild interstitial edema.   Harvest Dark, MD 04/27/16 [phone]

## 2016-04-27 NOTE — ED Triage Notes (Signed)
Pt to ED c/o dizziness and hypotension. Per pt he has been dizzy since yesterday, and had a syncopal episode falling on right side, denies hitting head. He was hypotensive today at his doctor appointment and instructed to follow up at the ED. Pt recently hospitalized for pneumonia. Pt drowsy during triage observed dosing off, but responds to verbal stimulation.

## 2016-04-27 NOTE — ED Provider Notes (Signed)
[facility] Emergency Department Provider Note  ____________________________________________   I have reviewed the triage vital signs and the nursing notes.   HISTORY  Chief Complaint Dizziness and Hypotension    HPI John [name]. is a 41 y.o. male who presents today with no complaints really. He is a very poor historian, at baseline apparently. Lives in a group home. Has a history of recurrent schizophrenia. Denies SI or HI. Has a history of morbid obesity and sleep apnea. Did have pneumonia 6 months ago. Patient is here because he has been acting differently according to group home for 2 weeks. Also, his blood pressure is been somewhat low. He does not have any complaints. He states he may have a slight cough. He denies any leg swelling. Denies headache stiff neck, denies any focal neurologic problems, he denies fever or chills he denies dysuria but I do not know how reliable his history is. Patient's baseline blood pressure appears to be in the 130s. He states he has had no new medications. He does state that he took someone else's pill, he is not sure what it was or when he took it. Initially it seemed like it may have happened today but it may have happened 2 weeks ago as well. He is not sure. Level V chart caveat, patient cannot reliably give a history of baseline     Past Medical History:  Diagnosis Date  . Anemia   . Asthma   . Diabetes mellitus   . Morbid obesity ([place])   . Paranoid schizophrenia ([place])   . Personality disorder   . Sleep apnea   . Thyroid disease     Patient Active Problem List   Diagnosis Date Noted  . Respiratory failure ([place]) 12/20/2015  . Cardiomyopathy ([place]) 11/28/2015  . Tobacco abuse 11/28/2015  . Acute respiratory failure with hypercapnia ([place])   . OSA (obstructive sleep apnea)   . Essential hypertension 11/25/2015  . Hypothyroidism 11/25/2015  . Paranoid schizophrenia, chronic condition ([place]) 11/25/2015  . Acute  respiratory failure with hypoxia ([place])   . Acute exacerbation of COPD with asthma ([place])   . Dyslipidemia associated with type 2 diabetes mellitus ([place])   . Diabetes mellitus due to underlying condition without complication, without long-term current use of insulin ([place])   . Morbid obesity ([place]) 09/29/2008    Past Surgical History:  Procedure Laterality Date  . FRACTURE SURGERY      Prior to Admission medications   Medication Sig Start Date End Date Taking? Authorizing Provider  Armodafinil 250 MG tablet Take 250 mg by mouth daily.   Yes Historical Provider, MD  atenolol (TENORMIN) 100 MG tablet Take 100 mg by mouth daily.   Yes Historical Provider, MD  atorvastatin (LIPITOR) 10 MG tablet Take 10 mg by mouth daily.   Yes Historical Provider, MD  budesonide-formoterol (SYMBICORT) 160-4.5 MCG/ACT inhaler Inhale 1 puff into the lungs 2 (two) times daily.   Yes Historical Provider, MD  clobetasol cream (TEMOVATE) 3.81 % Apply 1 application topically 2 (two) times daily as needed.    Yes Historical Provider, MD  clonazePAM (KLONOPIN) 0.5 MG tablet Take 0.5 mg by mouth 3 (three) times daily as needed. Take 1 tab qam and additional 2 dose per day prn   Yes Historical Provider, MD  clozapine (CLOZARIL) 200 MG tablet Take 1 tablet (200 mg total) by mouth daily. Patient taking differently: Take 300 mg by mouth daily.  11/28/15  Yes Robbie Lis, MD  divalproex (DEPAKOTE ER) 500 MG 24 hr tablet Take 2,000 mg by mouth at bedtime.    Yes Historical Provider, MD  fluocinolone (SYNALAR) 0.01 % external solution Apply 1 mL topically 2 (two) times daily as needed. Apply to scalp, neck and ears.    Yes Historical Provider, MD  furosemide (LASIX) 20 MG tablet Take 60 mg by mouth 2 (two) times daily.   Yes Historical Provider, MD  glipiZIDE (GLUCOTROL) 5 MG tablet Take 5 mg by mouth 2 (two) times daily before a meal.   Yes Historical Provider, MD  latanoprost (XALATAN) 0.005 % ophthalmic solution Place 1 drop  into both eyes at bedtime.   Yes Historical Provider, MD  levothyroxine (SYNTHROID, LEVOTHROID) 75 MCG tablet Take 75 mcg by mouth daily.   Yes Historical Provider, MD  loratadine (CLARITIN) 10 MG tablet Take 10 mg by mouth daily.   Yes Historical Provider, MD  losartan (COZAAR) 50 MG tablet Take 1 tablet (50 mg total) by mouth daily. 11/28/15  Yes Robbie Lis, MD  meloxicam (MOBIC) 7.5 MG tablet Take 7.5 mg by mouth daily.   Yes Historical Provider, MD  metFORMIN (GLUCOPHAGE) 1000 MG tablet Take 1,000 mg by mouth 2 (two) times daily with a meal.   Yes Historical Provider, MD  Multiple Vitamins-Minerals (MULTIVITAMINS THER. W/MINERALS) TABS tablet Take 1 tablet by mouth daily.   Yes Historical Provider, MD  omeprazole (PRILOSEC) 20 MG capsule Take 20 mg by mouth daily.   Yes Historical Provider, MD  oxybutynin (DITROPAN) 5 MG tablet Take 5 mg by mouth daily.   Yes Historical Provider, MD  triamcinolone cream (KENALOG) 0.1 % Apply 1 application topically 2 (two) times daily as needed.    Yes Historical Provider, MD    Allergies Patient has no known allergies.  Family History  Problem Relation Age of Onset  . Glaucoma Mother     Social History Social History  Substance Use Topics  . Smoking status: Former Research scientist (life sciences)  . Smokeless tobacco: Never Used  . Alcohol use Yes     Comment: only once in a while    Review of Systems Constitutional: No fever/chills Eyes: No visual changes. ENT: No sore throat. No stiff neck no neck pain Cardiovascular: Denies chest pain. Respiratory: Denies shortness of breath. Gastrointestinal:   no vomiting.  No diarrhea.  No constipation. Genitourinary: Negative for dysuria. Musculoskeletal: Negative lower extremity swelling Skin: Negative for rash. Neurological: Negative for severe headaches, focal weakness or numbness. 10-point ROS otherwise negative.  ____________________________________________   PHYSICAL EXAM:  VITAL SIGNS: ED Triage Vitals   Enc Vitals Group     BP 04/27/16 1229 96/61     Pulse Rate 04/27/16 1229 94     Resp 04/27/16 1229 18     Temp 04/27/16 1229 98.2 F (36.8 C)     Temp Source 04/27/16 1229 Oral     SpO2 04/27/16 1229 95 %     Weight 04/27/16 1230 (!) 330 lb (149.7 kg)     Height 04/27/16 1230 6\' 3"  (1.905 m)     Head Circumference --      Peak Flow --      Pain Score --      Pain Loc --      Pain Edu? --      Excl. in [place]? --     Constitutional: he is slightly sleepy but by no means lethargic and orientedTo name and place unsure of date. Well appearing and in no acute distress.  Eyes: Conjunctivae are normal. PERRL. EOMI. Head: Atraumatic. Nose: No congestion/rhinnorhea. Mouth/Throat: Mucous membranes are moist.  Oropharynx non-erythematous. Neck: No stridor.   Nontender with no meningismus Cardiovascular: Normal rate, regular rhythm. Grossly normal heart sounds.  Good peripheral circulation. Respiratory: Normal respiratory effort.  No retractions. Lungs CTAB. Abdominal: Soft and nontender. No distention. No guarding no rebound Back:  There is no focal tenderness or step off.  there is no midline tenderness there are no lesions noted. there is no CVA tenderness  Musculoskeletal: No lower extremity tenderness, no upper extremity tenderness. No joint effusions, no DVT signs strong distal pulses no edema Neurologic:  Normal speech and language. No gross focal neurologic deficits are appreciated.  Skin:  Skin is warm, dry and intact. No rash noted. Psychiatric: Mood and affect are normal. Speech and behavior are normal.  ____________________________________________   LABS (all labs ordered are listed, but only abnormal results are displayed)  Labs Reviewed  BASIC METABOLIC PANEL - Abnormal; Notable for the following:       Result Value   Potassium 3.4 (*)    Chloride 97 (*)    Glucose, Bld 64 (*)    Creatinine, Ser 1.40 (*)    Calcium 8.5 (*)    All other components within normal limits   CBC - Abnormal; Notable for the following:    Hemoglobin 12.9 (*)    HCT 39.6 (*)    RDW 15.6 (*)    All other components within normal limits  URINALYSIS, COMPLETE (UACMP) WITH MICROSCOPIC - Abnormal; Notable for the following:    Color, Urine AMBER (*)    APPearance HAZY (*)    Protein, ur 30 (*)    Leukocytes, UA TRACE (*)    Squamous Epithelial / LPF 0-5 (*)    All other components within normal limits  BLOOD GAS, VENOUS - Abnormal; Notable for the following:    pO2, Ven 47.0 (*)    Bicarbonate 34.9 (*)    Acid-Base Excess 7.8 (*)    All other components within normal limits  ACETAMINOPHEN LEVEL - Abnormal; Notable for the following:    Acetaminophen (Tylenol), Serum <10 (*)    All other components within normal limits  TROPONIN I - Abnormal; Notable for the following:    Troponin I 0.03 (*)    All other components within normal limits  LACTIC ACID, PLASMA - Abnormal; Notable for the following:    Lactic Acid, Venous 2.2 (*)    All other components within normal limits  GLUCOSE, CAPILLARY - Abnormal; Notable for the following:    Glucose-Capillary 48 (*)    All other components within normal limits  CULTURE, BLOOD (ROUTINE X 2)  CULTURE, BLOOD (ROUTINE X 2)  URINE CULTURE  BRAIN NATRIURETIC PEPTIDE  SALICYLATE LEVEL  ETHANOL  URINE DRUG SCREEN, QUALITATIVE ([facility] ONLY)  LACTIC ACID, PLASMA  HEPATIC FUNCTION PANEL  CBG MONITORING, ED   ____________________________________________  EKG  I personally interpreted any EKGs ordered by me or triage Sinus rhythm rate 82 bpm no acute ST elevation or acute ST depression normal axis, borderline IVCD ____________________________________________  RADIOLOGY  I reviewed any imaging ordered by me or triage that were performed during my shift and, if possible, patient and/or family made aware of any abnormal findings. ____________________________________________   PROCEDURES  Procedure(s) performed:  None  Procedures  Critical Care performed: None  ____________________________________________   INITIAL IMPRESSION / ASSESSMENT AND PLAN / ED COURSE  Pertinent labs & imaging results that were available during  my care of the patient were reviewed by me and considered in my medical decision making (see chart for details).  Patient here with pressure that slightly lower than baseline, lactic acid is 2.2. Very very difficult to tell what the underlying etiology is. No obvious source of infection is detected however given his hypoglycemia elevated lactate and hypotension we will give him broad-spectrum antibiotics as a precaution. Patient did have elements of low oxygen saturation must sleeping but his morbid obesity certainly could be contributing to that. Given that he is hypertensive and having hypoxia, we will obtain a CT scan of his chest. I will also get a CT scan of his head didn't show that there is no occult trauma given the patient's schizophrenia and is difficult to assess his mental status versus his baseline. We're giving him IV fluid here. We did recheck her sugar was 48 initially it was in the 60s. This could be contributed to some of his symptoms but don't think is contributing to his lactic acidemia nor do I think is contributing to his comparatively low blood pressure. It is not acidotic however on his ABG, urine drug screen is negative, CBC is reassuring. This could be caused by multiple different things including accidental medication ingestion, or occult infection. Signed out at the end of my shift.    ____________________________________________   FINAL CLINICAL IMPRESSION(S) / ED DIAGNOSES  Final diagnoses:  None      This chart was dictated using voice recognition software.  Despite best efforts to proofread,  errors can occur which can change meaning.      Schuyler Amor, MD 04/27/16 [phone]

## 2016-04-27 NOTE — Progress Notes (Signed)
Pharmacy Antibiotic Note  John [name]. is a 41 y.o. male with a h/o schizophrenia and DM admitted on 04/27/2016 with possible sepsis.  Pharmacy has been consulted for vancomycin and Zosyn dosing.  Plan: ke= 0.097 h-1, Vd= 77 >  Vancomycin 1000 mg initial dose then 1500 mg iv q 8 hours. Trough with the 5th dose. Goal trough 15-20 mcg/ml.   Zosyn 3.375 g EI q 8 hours.   Height: 6\' 3"  (190.5 cm) Weight: (!) 330 lb (149.7 kg) IBW/kg (Calculated) : 84.5  Temp (24hrs), Avg:98.2 F (36.8 C), Min:98.2 F (36.8 C), Max:98.2 F (36.8 C)   Recent Labs Lab 04/27/16 1231 04/27/16 1254  WBC 9.2  --   CREATININE 1.40*  --   LATICACIDVEN  --  2.2*    Estimated Creatinine Clearance: 111.9 mL/min (A) (by C-G formula based on SCr of 1.4 mg/dL (H)).    No Known Allergies  Antimicrobials this admission: vancomycin 4/27 >>  Zosyn 4/27 >>   Dose adjustments this admission:   Microbiology results: 4/27 BCx: sent 4/27 UCx: sent   Thank you for allowing pharmacy to be a part of this patient's care.  Ulice Dash D 04/27/2016 7:03 PM

## 2016-04-27 NOTE — H&P (Signed)
[place] [facility] [place] NAME: John [name]    MR#:  [id number]  DATE OF BIRTH:  04/08/1975  DATE OF ADMISSION:  04/27/2016  PRIMARY CARE PHYSICIAN: Cletis Athens, MD   REQUESTING/REFERRING PHYSICIAN: Harvest Dark, MD  CHIEF COMPLAINT:   Altered mental status HISTORY OF PRESENT ILLNESS:  John [name]  is a 41 y.o. male with a known history of Paranoid schizophrenia, diabetes, morbid obesity and multiple other medical problems living in a group home is not being himself today. Patient was reporting generalized weakness and was found to be hypotensive. Initial Accu-Chek was 64 and he eventually it was found to be at 47 one hour later. Patient was given D50 in the emergency department and  was given IV fluids. Patient's lactic acid is elevated. Sepsis protocol was implemented and patient is started on broad-spectrum IV antibiotics and hospitalist team is called to admit the patient.  PAST MEDICAL HISTORY:   Past Medical History:  Diagnosis Date  . Anemia   . Asthma   . Diabetes mellitus   . Morbid obesity ([place])   . Paranoid schizophrenia ([place])   . Personality disorder   . Sleep apnea   . Thyroid disease     PAST SURGICAL HISTOIRY:   Past Surgical History:  Procedure Laterality Date  . FRACTURE SURGERY      SOCIAL HISTORY:   Social History  Substance Use Topics  . Smoking status: Former Research scientist (life sciences)  . Smokeless tobacco: Never Used  . Alcohol use Yes     Comment: only once in a while    FAMILY HISTORY:   Family History  Problem Relation Age of Onset  . Glaucoma Mother     DRUG ALLERGIES:  No Known Allergies  REVIEW OF SYSTEMS:  Review of system unobtainable as the patient is altered from his baseline  MEDICATIONS AT HOME:   Prior to Admission medications   Medication Sig Start Date End Date Taking? Authorizing Provider  Armodafinil 250 MG tablet Take 250 mg by mouth daily.   Yes Historical Provider, MD  atenolol  (TENORMIN) 100 MG tablet Take 100 mg by mouth daily.   Yes Historical Provider, MD  atorvastatin (LIPITOR) 10 MG tablet Take 10 mg by mouth daily.   Yes Historical Provider, MD  budesonide-formoterol (SYMBICORT) 160-4.5 MCG/ACT inhaler Inhale 1 puff into the lungs 2 (two) times daily.   Yes Historical Provider, MD  clobetasol cream (TEMOVATE) 0.96 % Apply 1 application topically 2 (two) times daily as needed.    Yes Historical Provider, MD  clonazePAM (KLONOPIN) 0.5 MG tablet Take 0.5 mg by mouth 3 (three) times daily as needed. Take 1 tab qam and additional 2 dose per day prn   Yes Historical Provider, MD  clozapine (CLOZARIL) 200 MG tablet Take 1 tablet (200 mg total) by mouth daily. Patient taking differently: Take 300 mg by mouth daily.  11/28/15  Yes Robbie Lis, MD  divalproex (DEPAKOTE ER) 500 MG 24 hr tablet Take 2,000 mg by mouth at bedtime.    Yes Historical Provider, MD  fluocinolone (SYNALAR) 0.01 % external solution Apply 1 mL topically 2 (two) times daily as needed. Apply to scalp, neck and ears.    Yes Historical Provider, MD  furosemide (LASIX) 20 MG tablet Take 60 mg by mouth 2 (two) times daily.   Yes Historical Provider, MD  glipiZIDE (GLUCOTROL) 5 MG tablet Take 5 mg by mouth 2 (two) times daily before a meal.   Yes  Historical Provider, MD  latanoprost (XALATAN) 0.005 % ophthalmic solution Place 1 drop into both eyes at bedtime.   Yes Historical Provider, MD  levothyroxine (SYNTHROID, LEVOTHROID) 75 MCG tablet Take 75 mcg by mouth daily.   Yes Historical Provider, MD  loratadine (CLARITIN) 10 MG tablet Take 10 mg by mouth daily.   Yes Historical Provider, MD  losartan (COZAAR) 50 MG tablet Take 1 tablet (50 mg total) by mouth daily. 11/28/15  Yes Robbie Lis, MD  meloxicam (MOBIC) 7.5 MG tablet Take 7.5 mg by mouth daily.   Yes Historical Provider, MD  metFORMIN (GLUCOPHAGE) 1000 MG tablet Take 1,000 mg by mouth 2 (two) times daily with a meal.   Yes Historical Provider, MD   Multiple Vitamins-Minerals (MULTIVITAMINS THER. W/MINERALS) TABS tablet Take 1 tablet by mouth daily.   Yes Historical Provider, MD  omeprazole (PRILOSEC) 20 MG capsule Take 20 mg by mouth daily.   Yes Historical Provider, MD  oxybutynin (DITROPAN) 5 MG tablet Take 5 mg by mouth daily.   Yes Historical Provider, MD  triamcinolone cream (KENALOG) 0.1 % Apply 1 application topically 2 (two) times daily as needed.    Yes Historical Provider, MD      VITAL SIGNS:  Blood pressure 92/71, pulse 78, temperature 98.2 F (36.8 C), temperature source Oral, resp. rate 20, height 6\' 3"  (1.905 m), weight (!) 149.7 kg (330 lb), SpO2 100 %.  PHYSICAL EXAMINATION:  GENERAL:  41 y.o.-year-old patient lying in the bed with no acute distress. Obese EYES: Pupils equal, round, reactive to light and accommodation. No scleral icterus. Extraocular muscles intact.  HEENT: Head atraumatic, normocephalic. Oropharynx and nasopharynx clear.  NECK:  Supple, no jugular venous distention. No thyroid enlargement, no tenderness.  LUNGS: Normal breath sounds bilaterally, no wheezing, rales,rhonchi or crepitation. No use of accessory muscles of respiration.  CARDIOVASCULAR: S1, S2 normal. No murmurs, rubs, or gallops.  ABDOMEN: Soft, nontender, nondistended. Bowel sounds present. No organomegaly or mass.  EXTREMITIES: No pedal edema, cyanosis, or clubbing.  NEUROLOGIC: Awake and alert but disoriented PSYCHIATRIC: The patient is alert  SKIN: No obvious rash, lesion, or ulcer.   LABORATORY PANEL:   CBC  Recent Labs Lab 04/27/16 1231  WBC 9.2  HGB 12.9*  HCT 39.6*  PLT 186   ------------------------------------------------------------------------------------------------------------------  Chemistries   Recent Labs Lab 04/27/16 1231 04/27/16 1254  NA 138  --   K 3.4*  --   CL 97*  --   CO2 31  --   GLUCOSE 64*  --   BUN 16  --   CREATININE 1.40*  --   CALCIUM 8.5*  --   AST  --  21  ALT  --  10*   ALKPHOS  --  44  BILITOT  --  0.6   ------------------------------------------------------------------------------------------------------------------  Cardiac Enzymes  Recent Labs Lab 04/27/16 1254  TROPONINI 0.03*   ------------------------------------------------------------------------------------------------------------------  RADIOLOGY:  Dg Chest 2 View  Result Date: 04/27/2016 CLINICAL DATA:  Pt to ED c/o dizziness and hypotension. Per pt he has been dizzy since yesterday, and had a syncopal episode falling on right side, denies hitting head. He was hypotensive today at his doctor appointment. EXAM: CHEST  2 VIEW COMPARISON:  12/21/2015 FINDINGS: There is no focal parenchymal opacity. There is no pleural effusion or pneumothorax. There is stable cardiomegaly. The osseous structures are unremarkable. IMPRESSION: No active cardiopulmonary disease. Electronically Signed   By: Kathreen Devoid   On: 04/27/2016 13:26   Ct Head Wo Contrast  Result Date: 04/27/2016 CLINICAL DATA:  Dizziness and hypertension.  Weakness. EXAM: CT HEAD WITHOUT CONTRAST TECHNIQUE: Contiguous axial images were obtained from the base of the skull through the vertex without intravenous contrast. COMPARISON:  None. FINDINGS: Brain: No acute or remote infarction, hemorrhage, hydrocephalus, extra-axial collection or mass lesion/mass effect. Vascular: No hyperdense vessel or unexpected calcification. Skull: Normal. Negative for fracture or focal lesion. Sinuses/Orbits: Negative IMPRESSION: Negative head CT. Electronically Signed   By: Monte Fantasia M.D.   On: 04/27/2016 15:55   Ct Angio Chest Pe W And/or Wo Contrast  Result Date: 04/27/2016 CLINICAL DATA:  Hypotension, dizziness, syncope EXAM: CT ANGIOGRAPHY CHEST WITH CONTRAST TECHNIQUE: Multidetector CT imaging of the chest was performed using the standard protocol during bolus administration of intravenous contrast. Multiplanar CT image reconstructions and MIPs were  obtained to evaluate the vascular anatomy. CONTRAST:  75 mL Isovue COMPARISON:  CT 12/20/2015 FINDINGS: Cardiovascular: Aberrant RIGHT subclavian artery. No pericardial effusion. Coronary calcification No filling defects within the pulmonary arteries to suggest acute pulmonary embolism. No acute findings of the aorta or great vessels. No pericardial fluid. Mediastinum/Nodes: No axillary supraclavicular adenopathy. Small prevascular lymph nodes are similar to comparison exam. Residual thymus in the anterior mediastinum is unchanged. Esophagus normal. Lungs/Pleura: Bilateral pleural effusions. mild ground-glass opacities. No pneumothorax or pneumonia. No infiltrate. No infarction. Upper Abdomen: Limited view of the liver, kidneys, pancreas are unremarkable. Normal adrenal glands. Musculoskeletal: No aggressive osseous lesion. Review of the MIP images confirms the above findings. IMPRESSION: 1. No acute pulmonary embolism. 2. Small bilateral pleural effusions and mild interstitial edema and atelectasis. 3. Mild mediastinal adenopathy is likely reactive. Residual thymus is within normal limits. Electronically Signed   By: Suzy Bouchard M.D.   On: 04/27/2016 16:04    EKG:   Orders placed or performed during the hospital encounter of 04/27/16  . ED EKG  . ED EKG    IMPRESSION AND PLAN:   Hence John [name]  is a 41 y.o. male with a known history of Paranoid schizophrenia, diabetes, morbid obesity and multiple other medical problems living in a group home is not being himself today. Patient was reporting generalized weakness and was found to be hypotensive. Initial Accu-Chek was 64 and he eventually it was found to be at 47 one hour later. Patient was given D50 in the emergency department and  was given IV fluids.  #Altered mentation could be from sepsis and hypoglycemia with baseline chronic neurocognitive disability Provide IV fluids, IV antibiotics and holding diabetic medicines. Neuro checks  #Sepsis-needs  criteria with hypotension and positive lactic acid at 2.2-unclear source Admit to MedSurg unit. Blood cultures, urine cultures are ordered. Chest x-rays negative. CT chest is negative for pulmonary embolism. Empiric antibiotic Zosyn and vancomycin. Hydrate with IV fluids  #Hypoglycemia-patient was given D50. Monitor sugars closely. Holding metformin and glipizide which are his home medications.  #History of hypertension but patient is currently hypotensive holding his home medications atenolol and Cozaar.  Provide GI and DVT prophylaxis  All the records are reviewed and case discussed with ED provider. Management plans discussed with the patient, family and they are in agreement.  CODE STATUS: fc   TOTAL TIME TAKING CARE OF THIS PATIENT: 38minutes.   Note: This dictation was prepared with Dragon dictation along with smaller phrase technology. Any transcriptional errors that result from this process are unintentional.  Nicholes Mango M.D on 04/27/2016 at 5:58 PM  Between 7am to 6pm - Pager - [phone]  After 6pm go to  [email or web address] - password [email or web address]  Office  [phone]  CC: Primary care physician; Cletis Athens, MD

## 2016-04-27 NOTE — ED Notes (Signed)
Patient transported to CT 

## 2016-04-27 NOTE — Progress Notes (Signed)
Anticoagulation Note  41 y/o M ordered Lovenox 40 mg daily for DVT prophylaxis.   Body mass index is 41.25 kg/m.  Estimated Creatinine Clearance: 111.9 mL/min (A) (by C-G formula based on SCr of 1.4 mg/dL (H)).  Will increase Lovenox dosing to 40 mg bid for BMI > 40.   Ulice Dash, PharmD Clinical Pharmacist

## 2016-04-27 NOTE — ED Notes (Signed)
8 oz. of orange juice given

## 2016-04-27 NOTE — ED Notes (Signed)
Group Home Contacts: [place], or [name]

## 2016-04-27 NOTE — ED Notes (Signed)
Sandwich and 8oz juice given

## 2016-04-27 NOTE — ED Notes (Signed)
Crackers and peanut butter given 

## 2016-04-27 NOTE — ED Notes (Signed)
Sandwich given 

## 2016-04-27 NOTE — ED Triage Notes (Addendum)
First nurse note-pt from group home. Was at doctor for check up today after leaving rehab for PNA. Sent for slurred speech, dizziness, and hypotension. bp 97/53 at first nurse desk. Sx X 2 weeks per group home staff. They think pt may have had syncopal episode.

## 2016-04-28 ENCOUNTER — Encounter: Payer: Self-pay | Admitting: Internal Medicine

## 2016-04-28 DIAGNOSIS — E860 Dehydration: Secondary | ICD-10-CM | POA: Diagnosis not present

## 2016-04-28 DIAGNOSIS — Z79899 Other long term (current) drug therapy: Secondary | ICD-10-CM | POA: Diagnosis not present

## 2016-04-28 DIAGNOSIS — G4730 Sleep apnea, unspecified: Secondary | ICD-10-CM | POA: Diagnosis not present

## 2016-04-28 DIAGNOSIS — Z8701 Personal history of pneumonia (recurrent): Secondary | ICD-10-CM | POA: Diagnosis not present

## 2016-04-28 DIAGNOSIS — F200 Paranoid schizophrenia: Secondary | ICD-10-CM | POA: Diagnosis not present

## 2016-04-28 DIAGNOSIS — A419 Sepsis, unspecified organism: Secondary | ICD-10-CM | POA: Diagnosis not present

## 2016-04-28 DIAGNOSIS — Z7984 Long term (current) use of oral hypoglycemic drugs: Secondary | ICD-10-CM | POA: Diagnosis not present

## 2016-04-28 DIAGNOSIS — E669 Obesity, unspecified: Secondary | ICD-10-CM | POA: Diagnosis not present

## 2016-04-28 DIAGNOSIS — Z87891 Personal history of nicotine dependence: Secondary | ICD-10-CM | POA: Diagnosis not present

## 2016-04-28 DIAGNOSIS — F609 Personality disorder, unspecified: Secondary | ICD-10-CM | POA: Diagnosis not present

## 2016-04-28 DIAGNOSIS — E11649 Type 2 diabetes mellitus with hypoglycemia without coma: Secondary | ICD-10-CM | POA: Diagnosis not present

## 2016-04-28 DIAGNOSIS — Z6841 Body Mass Index (BMI) 40.0 and over, adult: Secondary | ICD-10-CM | POA: Diagnosis not present

## 2016-04-28 DIAGNOSIS — I959 Hypotension, unspecified: Secondary | ICD-10-CM | POA: Diagnosis not present

## 2016-04-28 DIAGNOSIS — E079 Disorder of thyroid, unspecified: Secondary | ICD-10-CM | POA: Diagnosis not present

## 2016-04-28 DIAGNOSIS — R531 Weakness: Secondary | ICD-10-CM | POA: Diagnosis present

## 2016-04-28 DIAGNOSIS — E119 Type 2 diabetes mellitus without complications: Secondary | ICD-10-CM | POA: Diagnosis not present

## 2016-04-28 DIAGNOSIS — E162 Hypoglycemia, unspecified: Secondary | ICD-10-CM | POA: Diagnosis not present

## 2016-04-28 DIAGNOSIS — Z23 Encounter for immunization: Secondary | ICD-10-CM | POA: Diagnosis not present

## 2016-04-28 LAB — URINE CULTURE: Culture: NO GROWTH

## 2016-04-28 LAB — GLUCOSE, CAPILLARY
GLUCOSE-CAPILLARY: 86 mg/dL (ref 65–99)
GLUCOSE-CAPILLARY: 122 mg/dL — AB (ref 65–99)

## 2016-04-28 LAB — MRSA PCR SCREENING: MRSA by PCR: NEGATIVE

## 2016-04-28 MED ORDER — FUROSEMIDE 20 MG PO TABS: 60.0000 mg | ORAL_TABLET | Freq: Every day | ORAL | Status: DC

## 2016-04-28 MED ORDER — GLIPIZIDE 5 MG PO TABS: 2.5000 mg | ORAL_TABLET | Freq: Every day | ORAL | 0 refills | Status: DC

## 2016-04-28 NOTE — Progress Notes (Signed)
Patient discharged to Group home per MD order. All discharge instructions given to patient and representative of group home. All questions answered and patient verbalized understanding of all discharge instructions.

## 2016-04-28 NOTE — Clinical Social Work Note (Signed)
Patient to dc to [facility] group [facility] via transportation provided by the facility. Flo Shanks is the facility contact. He requested to be called when patient is able to be picked up for dc. CSW will con't to follow for additional dc needs.  Santiago Bumpers, MSW, Latanya Presser [phone]

## 2016-04-28 NOTE — Progress Notes (Signed)
A&O patient, admitted with sepsis to room 117. Denies pain. Pt up to BR for BM and void on arrival. Cardiac monitor and continuous pulse oximetry initiated, reported 1st degree Heart Block and oxygen at 100% on 2Liters.  IVF infusing per MD order.  Admission profile completed. Pt scores high fall risk, bed alarm activated.

## 2016-04-28 NOTE — Discharge Instructions (Addendum)
Resume diet and activity as before  Your glipizide( Diabetes medication) dose has been reduced

## 2016-04-28 NOTE — Discharge Summary (Signed)
[facility] [place] NAME: John [name]    MR#:  [id number]  DATE OF BIRTH:  [birth year]  DATE OF ADMISSION:  04/27/2016 ADMITTING PHYSICIAN: Nicholes Mango, MD  DATE OF DISCHARGE: No discharge date for patient encounter.  PRIMARY CARE PHYSICIAN: MASOUD,JAVED, MD   ADMISSION DIAGNOSIS:  Weakness [R53.1] Hypoglycemia [E16.2] Hypotension, unspecified hypotension type [I95.9]  DISCHARGE DIAGNOSIS:  Active Problems:   Sepsis ([place])   SECONDARY DIAGNOSIS:   Past Medical History:  Diagnosis Date  . Anemia   . Asthma   . Diabetes mellitus   . Morbid obesity ([place])   . Paranoid schizophrenia ([place])   . Personality disorder   . Sleep apnea   . Thyroid disease      ADMITTING HISTORY  HISTORY OF PRESENT ILLNESS:  John [name]  is a 41 y.o. male with a known history of Paranoid schizophrenia, diabetes, morbid obesity and multiple other medical problems living in a group home is not being himself today. Patient was reporting generalized weakness and was found to be hypotensive. Initial Accu-Chek was 64 and he eventually it was found to be at 47 one hour later. Patient was given D50 in the emergency department and  was given IV fluids. Patient's lactic acid is elevated. Sepsis protocol was implemented and patient is started on broad-spectrum IV antibiotics and hospitalist team is called to admit the patient.   HOSPITAL COURSE:   * Hypoglycemia Decrease glucotrol dosing. Continue accuchecks twice daily.   * Hypotension likely from dehydration Also stop losartan. Decrease lasix to daily  * Elevated lactic acid due to hypotension No signs of infection  * Stable for discharge back to group home PCP f/u in 1 week  CONSULTS OBTAINED:    DRUG ALLERGIES:  No Known Allergies  DISCHARGE MEDICATIONS:   Current Discharge Medication List    CONTINUE these medications which have CHANGED   Details  furosemide (LASIX) 20 MG tablet Take 3 tablets  (60 mg total) by mouth daily. Qty: 30 tablet    glipiZIDE (GLUCOTROL) 5 MG tablet Take 0.5 tablets (2.5 mg total) by mouth daily before breakfast. Qty: 30 tablet, Refills: 0      CONTINUE these medications which have NOT CHANGED   Details  Armodafinil 250 MG tablet Take 250 mg by mouth daily.    atenolol (TENORMIN) 100 MG tablet Take 100 mg by mouth daily.    atorvastatin (LIPITOR) 10 MG tablet Take 10 mg by mouth daily.    budesonide-formoterol (SYMBICORT) 160-4.5 MCG/ACT inhaler Inhale 1 puff into the lungs 2 (two) times daily.    clobetasol cream (TEMOVATE) 2.94 % Apply 1 application topically 2 (two) times daily as needed.     clonazePAM (KLONOPIN) 0.5 MG tablet Take 0.5 mg by mouth 3 (three) times daily as needed. Take 1 tab qam and additional 2 dose per day prn    clozapine (CLOZARIL) 200 MG tablet Take 1 tablet (200 mg total) by mouth daily. Qty: 30 tablet, Refills: 0    divalproex (DEPAKOTE ER) 500 MG 24 hr tablet Take 2,000 mg by mouth at bedtime.     fluocinolone (SYNALAR) 0.01 % external solution Apply 1 mL topically 2 (two) times daily as needed. Apply to scalp, neck and ears.     latanoprost (XALATAN) 0.005 % ophthalmic solution Place 1 drop into both eyes at bedtime.    levothyroxine (SYNTHROID, LEVOTHROID) 75 MCG tablet Take 75 mcg by mouth daily.    loratadine (CLARITIN) 10 MG  tablet Take 10 mg by mouth daily.    meloxicam (MOBIC) 7.5 MG tablet Take 7.5 mg by mouth daily.    metFORMIN (GLUCOPHAGE) 1000 MG tablet Take 1,000 mg by mouth 2 (two) times daily with a meal.    Multiple Vitamins-Minerals (MULTIVITAMINS THER. W/MINERALS) TABS tablet Take 1 tablet by mouth daily.    omeprazole (PRILOSEC) 20 MG capsule Take 20 mg by mouth daily.    oxybutynin (DITROPAN) 5 MG tablet Take 5 mg by mouth daily.    triamcinolone cream (KENALOG) 0.1 % Apply 1 application topically 2 (two) times daily as needed.       STOP taking these medications     losartan (COZAAR)  50 MG tablet         Today   VITAL SIGNS:  Blood pressure (!) 103/49, pulse 81, temperature 97.5 F (36.4 C), temperature source Oral, resp. rate 20, height 6' (1.829 m), weight (!) 151.4 kg (333 lb 12.8 oz), SpO2 95 %.  I/O:   Intake/Output Summary (Last 24 hours) at 04/28/16 1224 Last data filed at 04/28/16 1158  Gross per 24 hour  Intake             4670 ml  Output              600 ml  Net             4070 ml    PHYSICAL EXAMINATION:  Physical Exam  GENERAL:  41 y.o.-year-old patient lying in the bed with no acute distress. obese LUNGS: Normal breath sounds bilaterally, no wheezing, rales,rhonchi or crepitation. No use of accessory muscles of respiration.  CARDIOVASCULAR: S1, S2 normal. No murmurs, rubs, or gallops.  ABDOMEN: Soft, non-tender, non-distended. Bowel sounds present. No organomegaly or mass.  NEUROLOGIC: Moves all 4 extremities. PSYCHIATRIC: The patient is alert and oriented x 3.  SKIN: No obvious rash, lesion, or ulcer.   DATA REVIEW:   CBC  Recent Labs Lab 04/27/16 1231  WBC 9.2  HGB 12.9*  HCT 39.6*  PLT 186    Chemistries   Recent Labs Lab 04/27/16 1231 04/27/16 1254  NA 138  --   K 3.4*  --   CL 97*  --   CO2 31  --   GLUCOSE 64*  --   BUN 16  --   CREATININE 1.40*  --   CALCIUM 8.5*  --   AST  --  21  ALT  --  10*  ALKPHOS  --  44  BILITOT  --  0.6    Cardiac Enzymes  Recent Labs Lab 04/27/16 1254  TROPONINI 0.03*    Microbiology Results  Results for orders placed or performed during the hospital encounter of 04/27/16  Culture, blood (routine x 2)     Status: None (Preliminary result)   Collection Time: 04/27/16 12:55 PM  Result Value Ref Range Status   Specimen Description BLOOD RIGHT ARM  Final   Special Requests   Final    BOTTLES DRAWN AEROBIC AND ANAEROBIC Blood Culture adequate volume   Culture NO GROWTH < 24 HOURS  Final   Report Status PENDING  Incomplete  Culture, blood (routine x 2)     Status: None  (Preliminary result)   Collection Time: 04/27/16  1:00 PM  Result Value Ref Range Status   Specimen Description BLOOD RIGHT WRIST  Final   Special Requests   Final    BOTTLES DRAWN AEROBIC AND ANAEROBIC Blood Culture adequate volume   Culture  NO GROWTH < 24 HOURS  Final   Report Status PENDING  Incomplete  MRSA PCR Screening     Status: None   Collection Time: 04/27/16  9:57 PM  Result Value Ref Range Status   MRSA by PCR NEGATIVE NEGATIVE Final    Comment:        The GeneXpert MRSA Assay (FDA approved for NASAL specimens only), is one component of a comprehensive MRSA colonization surveillance program. It is not intended to diagnose MRSA infection nor to guide or monitor treatment for MRSA infections.     RADIOLOGY:  Dg Chest 2 View  Result Date: 04/27/2016 CLINICAL DATA:  Pt to ED c/o dizziness and hypotension. Per pt he has been dizzy since yesterday, and had a syncopal episode falling on right side, denies hitting head. He was hypotensive today at his doctor appointment. EXAM: CHEST  2 VIEW COMPARISON:  12/21/2015 FINDINGS: There is no focal parenchymal opacity. There is no pleural effusion or pneumothorax. There is stable cardiomegaly. The osseous structures are unremarkable. IMPRESSION: No active cardiopulmonary disease. Electronically Signed   By: Kathreen Devoid   On: 04/27/2016 13:26   Ct Head Wo Contrast  Result Date: 04/27/2016 CLINICAL DATA:  Dizziness and hypertension.  Weakness. EXAM: CT HEAD WITHOUT CONTRAST TECHNIQUE: Contiguous axial images were obtained from the base of the skull through the vertex without intravenous contrast. COMPARISON:  None. FINDINGS: Brain: No acute or remote infarction, hemorrhage, hydrocephalus, extra-axial collection or mass lesion/mass effect. Vascular: No hyperdense vessel or unexpected calcification. Skull: Normal. Negative for fracture or focal lesion. Sinuses/Orbits: Negative IMPRESSION: Negative head CT. Electronically Signed   By:  Monte Fantasia M.D.   On: 04/27/2016 15:55   Ct Angio Chest Pe W And/or Wo Contrast  Result Date: 04/27/2016 CLINICAL DATA:  Hypotension, dizziness, syncope EXAM: CT ANGIOGRAPHY CHEST WITH CONTRAST TECHNIQUE: Multidetector CT imaging of the chest was performed using the standard protocol during bolus administration of intravenous contrast. Multiplanar CT image reconstructions and MIPs were obtained to evaluate the vascular anatomy. CONTRAST:  75 mL Isovue COMPARISON:  CT 12/20/2015 FINDINGS: Cardiovascular: Aberrant RIGHT subclavian artery. No pericardial effusion. Coronary calcification No filling defects within the pulmonary arteries to suggest acute pulmonary embolism. No acute findings of the aorta or great vessels. No pericardial fluid. Mediastinum/Nodes: No axillary supraclavicular adenopathy. Small prevascular lymph nodes are similar to comparison exam. Residual thymus in the anterior mediastinum is unchanged. Esophagus normal. Lungs/Pleura: Bilateral pleural effusions. mild ground-glass opacities. No pneumothorax or pneumonia. No infiltrate. No infarction. Upper Abdomen: Limited view of the liver, kidneys, pancreas are unremarkable. Normal adrenal glands. Musculoskeletal: No aggressive osseous lesion. Review of the MIP images confirms the above findings. IMPRESSION: 1. No acute pulmonary embolism. 2. Small bilateral pleural effusions and mild interstitial edema and atelectasis. 3. Mild mediastinal adenopathy is likely reactive. Residual thymus is within normal limits. Electronically Signed   By: Suzy Bouchard M.D.   On: 04/27/2016 16:04    Follow up with PCP in 1 week.  Management plans discussed with the patient, family and they are in agreement.  CODE STATUS:  Code Status History    Date Active Date Inactive Code Status Order ID Comments User Context   12/20/2015  4:14 PM 12/28/2015  7:27 PM Full Code [id number]  Vilinda Boehringer, MD ED   11/25/2015 10:37 AM 11/28/2015  9:34 PM Full Code  [id number]  Robbie Lis, MD Inpatient      TOTAL TIME TAKING CARE OF THIS PATIENT ON DAY OF DISCHARGE:  more than 30 minutes.   Hillary Bow R M.D on 04/28/2016 at 12:24 PM  Between 7am to 6pm - Pager - [phone]  After 6pm go to [email or web address] - password [email or web address] [facility]  Office  [phone]  CC: Primary care physician; Cletis Athens, MD  Note: This dictation was prepared with Dragon dictation along with smaller phrase technology. Any transcriptional errors that result from this process are unintentional.

## 2016-04-30 LAB — HEMOGLOBIN A1C
HEMOGLOBIN A1C: 5.6 % (ref 4.8–5.6)
MEAN PLASMA GLUCOSE: 114 mg/dL

## 2016-05-02 LAB — CULTURE, BLOOD (ROUTINE X 2)
CULTURE: NO GROWTH
Culture: NO GROWTH
SPECIAL REQUESTS: ADEQUATE
SPECIAL REQUESTS: ADEQUATE

## 2016-05-04 DIAGNOSIS — L309 Dermatitis, unspecified: Secondary | ICD-10-CM | POA: Diagnosis not present

## 2016-05-04 DIAGNOSIS — L503 Dermatographic urticaria: Secondary | ICD-10-CM | POA: Diagnosis not present

## 2016-05-04 DIAGNOSIS — G4733 Obstructive sleep apnea (adult) (pediatric): Secondary | ICD-10-CM | POA: Diagnosis not present

## 2016-05-04 DIAGNOSIS — Z9989 Dependence on other enabling machines and devices: Secondary | ICD-10-CM | POA: Diagnosis not present

## 2016-06-15 DIAGNOSIS — F250 Schizoaffective disorder, bipolar type: Secondary | ICD-10-CM | POA: Diagnosis not present

## 2016-06-15 DIAGNOSIS — Z79899 Other long term (current) drug therapy: Secondary | ICD-10-CM | POA: Diagnosis not present

## 2016-06-21 DIAGNOSIS — G4733 Obstructive sleep apnea (adult) (pediatric): Secondary | ICD-10-CM | POA: Diagnosis not present

## 2016-06-27 DIAGNOSIS — F251 Schizoaffective disorder, depressive type: Secondary | ICD-10-CM | POA: Diagnosis not present

## 2016-07-03 DIAGNOSIS — F251 Schizoaffective disorder, depressive type: Secondary | ICD-10-CM | POA: Diagnosis not present

## 2016-07-12 DIAGNOSIS — F251 Schizoaffective disorder, depressive type: Secondary | ICD-10-CM | POA: Diagnosis not present

## 2016-07-19 DIAGNOSIS — F251 Schizoaffective disorder, depressive type: Secondary | ICD-10-CM | POA: Diagnosis not present

## 2016-07-27 DIAGNOSIS — F251 Schizoaffective disorder, depressive type: Secondary | ICD-10-CM | POA: Diagnosis not present

## 2016-08-02 DIAGNOSIS — F251 Schizoaffective disorder, depressive type: Secondary | ICD-10-CM | POA: Diagnosis not present

## 2016-08-03 DIAGNOSIS — E038 Other specified hypothyroidism: Secondary | ICD-10-CM | POA: Diagnosis not present

## 2016-08-03 DIAGNOSIS — E1165 Type 2 diabetes mellitus with hyperglycemia: Secondary | ICD-10-CM | POA: Diagnosis not present

## 2016-08-03 DIAGNOSIS — R0689 Other abnormalities of breathing: Secondary | ICD-10-CM | POA: Diagnosis not present

## 2016-08-03 DIAGNOSIS — K210 Gastro-esophageal reflux disease with esophagitis: Secondary | ICD-10-CM | POA: Diagnosis not present

## 2016-08-09 DIAGNOSIS — F251 Schizoaffective disorder, depressive type: Secondary | ICD-10-CM | POA: Diagnosis not present

## 2016-08-17 DIAGNOSIS — F251 Schizoaffective disorder, depressive type: Secondary | ICD-10-CM | POA: Diagnosis not present

## 2016-08-23 DIAGNOSIS — F251 Schizoaffective disorder, depressive type: Secondary | ICD-10-CM | POA: Diagnosis not present

## 2016-08-30 DIAGNOSIS — F251 Schizoaffective disorder, depressive type: Secondary | ICD-10-CM | POA: Diagnosis not present

## 2016-09-06 DIAGNOSIS — F251 Schizoaffective disorder, depressive type: Secondary | ICD-10-CM | POA: Diagnosis not present

## 2016-09-13 DIAGNOSIS — F251 Schizoaffective disorder, depressive type: Secondary | ICD-10-CM | POA: Diagnosis not present

## 2016-09-20 DIAGNOSIS — F251 Schizoaffective disorder, depressive type: Secondary | ICD-10-CM | POA: Diagnosis not present

## 2016-10-03 DIAGNOSIS — F251 Schizoaffective disorder, depressive type: Secondary | ICD-10-CM | POA: Diagnosis not present

## 2016-10-05 DIAGNOSIS — Z23 Encounter for immunization: Secondary | ICD-10-CM | POA: Diagnosis not present

## 2016-10-05 DIAGNOSIS — Z9989 Dependence on other enabling machines and devices: Secondary | ICD-10-CM | POA: Diagnosis not present

## 2016-10-05 DIAGNOSIS — K210 Gastro-esophageal reflux disease with esophagitis: Secondary | ICD-10-CM | POA: Diagnosis not present

## 2016-10-05 DIAGNOSIS — G4733 Obstructive sleep apnea (adult) (pediatric): Secondary | ICD-10-CM | POA: Diagnosis not present

## 2016-10-05 DIAGNOSIS — E038 Other specified hypothyroidism: Secondary | ICD-10-CM | POA: Diagnosis not present

## 2016-10-10 DIAGNOSIS — F251 Schizoaffective disorder, depressive type: Secondary | ICD-10-CM | POA: Diagnosis not present

## 2016-10-17 DIAGNOSIS — F259 Schizoaffective disorder, unspecified: Secondary | ICD-10-CM | POA: Diagnosis not present

## 2016-10-17 DIAGNOSIS — F251 Schizoaffective disorder, depressive type: Secondary | ICD-10-CM | POA: Diagnosis not present

## 2016-11-01 ENCOUNTER — Emergency Department: Payer: 59

## 2016-11-01 ENCOUNTER — Inpatient Hospital Stay
Admission: EM | Admit: 2016-11-01 | Discharge: 2016-11-14 | DRG: 871 | Disposition: A | Discharge status: Skilled Nursing Facility | Payer: 59 | Attending: Internal Medicine | Admitting: Internal Medicine

## 2016-11-01 ENCOUNTER — Encounter: Payer: Self-pay | Admitting: *Deleted

## 2016-11-01 DIAGNOSIS — F39 Unspecified mood [affective] disorder: Secondary | ICD-10-CM | POA: Diagnosis present

## 2016-11-01 DIAGNOSIS — I10 Essential (primary) hypertension: Secondary | ICD-10-CM | POA: Diagnosis not present

## 2016-11-01 DIAGNOSIS — G9341 Metabolic encephalopathy: Secondary | ICD-10-CM | POA: Diagnosis present

## 2016-11-01 DIAGNOSIS — Z87891 Personal history of nicotine dependence: Secondary | ICD-10-CM | POA: Diagnosis not present

## 2016-11-01 DIAGNOSIS — K567 Ileus, unspecified: Secondary | ICD-10-CM

## 2016-11-01 DIAGNOSIS — Z79899 Other long term (current) drug therapy: Secondary | ICD-10-CM | POA: Diagnosis not present

## 2016-11-01 DIAGNOSIS — F1910 Other psychoactive substance abuse, uncomplicated: Secondary | ICD-10-CM | POA: Diagnosis present

## 2016-11-01 DIAGNOSIS — J189 Pneumonia, unspecified organism: Secondary | ICD-10-CM | POA: Diagnosis not present

## 2016-11-01 DIAGNOSIS — E119 Type 2 diabetes mellitus without complications: Secondary | ICD-10-CM | POA: Diagnosis present

## 2016-11-01 DIAGNOSIS — E876 Hypokalemia: Secondary | ICD-10-CM | POA: Diagnosis not present

## 2016-11-01 DIAGNOSIS — E039 Hypothyroidism, unspecified: Secondary | ICD-10-CM | POA: Diagnosis present

## 2016-11-01 DIAGNOSIS — J9601 Acute respiratory failure with hypoxia: Secondary | ICD-10-CM | POA: Diagnosis not present

## 2016-11-01 DIAGNOSIS — A419 Sepsis, unspecified organism: Secondary | ICD-10-CM | POA: Diagnosis not present

## 2016-11-01 DIAGNOSIS — J45909 Unspecified asthma, uncomplicated: Secondary | ICD-10-CM | POA: Diagnosis present

## 2016-11-01 DIAGNOSIS — R402412 Glasgow coma scale score 13-15, at arrival to emergency department: Secondary | ICD-10-CM | POA: Diagnosis present

## 2016-11-01 DIAGNOSIS — F609 Personality disorder, unspecified: Secondary | ICD-10-CM | POA: Diagnosis present

## 2016-11-01 DIAGNOSIS — R9431 Abnormal electrocardiogram [ECG] [EKG]: Secondary | ICD-10-CM | POA: Diagnosis not present

## 2016-11-01 DIAGNOSIS — G4733 Obstructive sleep apnea (adult) (pediatric): Secondary | ICD-10-CM | POA: Diagnosis not present

## 2016-11-01 DIAGNOSIS — R400 Somnolence: Secondary | ICD-10-CM | POA: Diagnosis present

## 2016-11-01 DIAGNOSIS — J9600 Acute respiratory failure, unspecified whether with hypoxia or hypercapnia: Secondary | ICD-10-CM

## 2016-11-01 DIAGNOSIS — E8770 Fluid overload, unspecified: Secondary | ICD-10-CM | POA: Diagnosis present

## 2016-11-01 DIAGNOSIS — F209 Schizophrenia, unspecified: Secondary | ICD-10-CM | POA: Diagnosis not present

## 2016-11-01 DIAGNOSIS — Z4659 Encounter for fitting and adjustment of other gastrointestinal appliance and device: Secondary | ICD-10-CM

## 2016-11-01 DIAGNOSIS — J188 Other pneumonia, unspecified organism: Secondary | ICD-10-CM | POA: Diagnosis not present

## 2016-11-01 DIAGNOSIS — N179 Acute kidney failure, unspecified: Secondary | ICD-10-CM | POA: Diagnosis not present

## 2016-11-01 DIAGNOSIS — Z7984 Long term (current) use of oral hypoglycemic drugs: Secondary | ICD-10-CM

## 2016-11-01 DIAGNOSIS — R140 Abdominal distension (gaseous): Secondary | ICD-10-CM | POA: Diagnosis not present

## 2016-11-01 DIAGNOSIS — R509 Fever, unspecified: Secondary | ICD-10-CM

## 2016-11-01 DIAGNOSIS — R0602 Shortness of breath: Secondary | ICD-10-CM

## 2016-11-01 DIAGNOSIS — G4730 Sleep apnea, unspecified: Secondary | ICD-10-CM | POA: Diagnosis present

## 2016-11-01 DIAGNOSIS — E860 Dehydration: Secondary | ICD-10-CM | POA: Diagnosis present

## 2016-11-01 DIAGNOSIS — I081 Rheumatic disorders of both mitral and tricuspid valves: Secondary | ICD-10-CM | POA: Diagnosis present

## 2016-11-01 DIAGNOSIS — E662 Morbid (severe) obesity with alveolar hypoventilation: Secondary | ICD-10-CM | POA: Diagnosis present

## 2016-11-01 DIAGNOSIS — E785 Hyperlipidemia, unspecified: Secondary | ICD-10-CM | POA: Diagnosis present

## 2016-11-01 DIAGNOSIS — J9602 Acute respiratory failure with hypercapnia: Secondary | ICD-10-CM | POA: Diagnosis present

## 2016-11-01 DIAGNOSIS — E1169 Type 2 diabetes mellitus with other specified complication: Secondary | ICD-10-CM | POA: Diagnosis present

## 2016-11-01 DIAGNOSIS — Z791 Long term (current) use of non-steroidal anti-inflammatories (NSAID): Secondary | ICD-10-CM

## 2016-11-01 DIAGNOSIS — G9340 Encephalopathy, unspecified: Secondary | ICD-10-CM | POA: Diagnosis not present

## 2016-11-01 DIAGNOSIS — Z6838 Body mass index (BMI) 38.0-38.9, adult: Secondary | ICD-10-CM

## 2016-11-01 DIAGNOSIS — F200 Paranoid schizophrenia: Secondary | ICD-10-CM | POA: Diagnosis not present

## 2016-11-01 DIAGNOSIS — E089 Diabetes mellitus due to underlying condition without complications: Secondary | ICD-10-CM | POA: Diagnosis present

## 2016-11-01 LAB — COMPREHENSIVE METABOLIC PANEL
ALT: 16 U/L — ABNORMAL LOW (ref 17–63)
ANION GAP: 10 (ref 5–15)
AST: 29 U/L (ref 15–41)
Albumin: 3.7 g/dL (ref 3.5–5.0)
Alkaline Phosphatase: 32 U/L — ABNORMAL LOW (ref 38–126)
BILIRUBIN TOTAL: 0.9 mg/dL (ref 0.3–1.2)
BUN: 21 mg/dL — AB (ref 6–20)
CO2: 28 mmol/L (ref 22–32)
Calcium: 8.3 mg/dL — ABNORMAL LOW (ref 8.9–10.3)
Chloride: 99 mmol/L — ABNORMAL LOW (ref 101–111)
Creatinine, Ser: 2.83 mg/dL — ABNORMAL HIGH (ref 0.61–1.24)
GFR calc Af Amer: 31 mL/min — ABNORMAL LOW (ref >60)
GFR calc non Af Amer: 27 mL/min — ABNORMAL LOW (ref >60)
Glucose, Bld: 93 mg/dL (ref 65–99)
POTASSIUM: 3.3 mmol/L — AB (ref 3.5–5.1)
SODIUM: 137 mmol/L (ref 135–145)
TOTAL PROTEIN: 7.7 g/dL (ref 6.5–8.1)

## 2016-11-01 LAB — VALPROIC ACID LEVEL: VALPROIC ACID LVL: 25 ug/mL — AB (ref 50.0–100.0)

## 2016-11-01 LAB — URINALYSIS, COMPLETE (UACMP) WITH MICROSCOPIC
BACTERIA UA: NONE SEEN
Glucose, UA: NEGATIVE mg/dL
HGB URINE DIPSTICK: NEGATIVE
KETONES UR: 15 mg/dL — AB
LEUKOCYTES UA: NEGATIVE
Nitrite: NEGATIVE
PROTEIN: 100 mg/dL — AB
SPECIFIC GRAVITY, URINE: 1.025 (ref 1.005–1.030)
pH: 5 (ref 5.0–8.0)

## 2016-11-01 LAB — URINE DRUG SCREEN, QUALITATIVE (ARMC ONLY)
Amphetamines, Ur Screen: NOT DETECTED
BARBITURATES, UR SCREEN: NOT DETECTED
BENZODIAZEPINE, UR SCRN: NOT DETECTED
CANNABINOID 50 NG, UR ~~LOC~~: NOT DETECTED
COCAINE METABOLITE, UR ~~LOC~~: NOT DETECTED
MDMA (Ecstasy)Ur Screen: NOT DETECTED
METHADONE SCREEN, URINE: NOT DETECTED
Opiate, Ur Screen: NOT DETECTED
Phencyclidine (PCP) Ur S: NOT DETECTED
TRICYCLIC, UR SCREEN: NOT DETECTED

## 2016-11-01 LAB — CBC WITH DIFFERENTIAL/PLATELET
Basophils Absolute: 0.1 10*3/uL (ref 0–0.1)
Basophils Relative: 0 %
EOS ABS: 0 10*3/uL (ref 0–0.7)
EOS PCT: 0 %
HEMATOCRIT: 37.1 % — AB (ref 40.0–52.0)
HEMOGLOBIN: 12.3 g/dL — AB (ref 13.0–18.0)
LYMPHS ABS: 1.6 10*3/uL (ref 1.0–3.6)
LYMPHS PCT: 13 %
MCH: 27.4 pg (ref 26.0–34.0)
MCHC: 33 g/dL (ref 32.0–36.0)
MCV: 82.8 fL (ref 80.0–100.0)
MONO ABS: 1 10*3/uL (ref 0.2–1.0)
Monocytes Relative: 9 %
Neutro Abs: 9.2 10*3/uL — ABNORMAL HIGH (ref 1.4–6.5)
Neutrophils Relative %: 78 %
Platelets: 164 10*3/uL (ref 150–440)
RBC: 4.48 MIL/uL (ref 4.40–5.90)
RDW: 14.8 % — AB (ref 11.5–14.5)
WBC: 11.9 10*3/uL — ABNORMAL HIGH (ref 3.8–10.6)

## 2016-11-01 LAB — TSH: TSH: 1.842 u[IU]/mL (ref 0.350–4.500)

## 2016-11-01 LAB — BLOOD GAS, VENOUS
ACID-BASE EXCESS: 5.8 mmol/L — AB (ref 0.0–2.0)
BICARBONATE: 32.1 mmol/L — AB (ref 20.0–28.0)
O2 SAT: 97.5 %
PATIENT TEMPERATURE: 37
PO2 VEN: 97 mmHg — AB (ref 32.0–45.0)
pCO2, Ven: 53 mmHg (ref 44.0–60.0)
pH, Ven: 7.39 (ref 7.250–7.430)

## 2016-11-01 LAB — LITHIUM LEVEL: LITHIUM LVL: 1.29 mmol/L — AB (ref 0.60–1.20)

## 2016-11-01 LAB — LACTIC ACID, PLASMA: LACTIC ACID, VENOUS: 1.3 mmol/L (ref 0.5–1.9)

## 2016-11-01 LAB — GLUCOSE, CAPILLARY
GLUCOSE-CAPILLARY: 67 mg/dL (ref 65–99)
Glucose-Capillary: 63 mg/dL — ABNORMAL LOW (ref 65–99)

## 2016-11-01 LAB — BRAIN NATRIURETIC PEPTIDE: B Natriuretic Peptide: 75 pg/mL (ref 0.0–100.0)

## 2016-11-01 LAB — ETHANOL

## 2016-11-01 MED ORDER — ONDANSETRON HCL 4 MG PO TABS: 4.0000 mg | ORAL_TABLET | Freq: Four times a day (QID) | ORAL | Status: DC | PRN

## 2016-11-01 MED ORDER — LATANOPROST 0.005 % OP SOLN
1.0000 [drp] | Freq: Every day | OPHTHALMIC | Status: DC
  Administered 2016-11-01 – 2016-11-13 (×12): 1 [drp] via OPHTHALMIC
  Filled 2016-11-01 (×3): qty 2.5

## 2016-11-01 MED ORDER — SODIUM CHLORIDE 0.9 % IV BOLUS (SEPSIS)
1000.0000 mL | Freq: Once | INTRAVENOUS | Status: AC
  Administered 2016-11-01: 1000 mL via INTRAVENOUS

## 2016-11-01 MED ORDER — ACETAMINOPHEN 325 MG PO TABS
650.0000 mg | ORAL_TABLET | Freq: Four times a day (QID) | ORAL | Status: DC | PRN
  Administered 2016-11-02: 23:00:00 650 mg via ORAL
  Filled 2016-11-01: qty 2

## 2016-11-01 MED ORDER — ONDANSETRON HCL 4 MG/2ML IJ SOLN: 4.0000 mg | Freq: Four times a day (QID) | INTRAMUSCULAR | Status: DC | PRN

## 2016-11-01 MED ORDER — SODIUM CHLORIDE 0.9 % IV SOLN
INTRAVENOUS | Status: AC
  Administered 2016-11-01: 23:00:00 via INTRAVENOUS

## 2016-11-01 MED ORDER — LEVOTHYROXINE SODIUM 50 MCG PO TABS
75.0000 ug | ORAL_TABLET | Freq: Every day | ORAL | Status: DC
  Administered 2016-11-02 – 2016-11-14 (×11): 75 ug via ORAL
  Filled 2016-11-01 (×2): qty 1
  Filled 2016-11-01: qty 2
  Filled 2016-11-01: qty 1
  Filled 2016-11-01: qty 2
  Filled 2016-11-01 (×3): qty 1
  Filled 2016-11-01 (×2): qty 2
  Filled 2016-11-01: qty 1

## 2016-11-01 MED ORDER — VANCOMYCIN HCL 500 MG IV SOLR
500.0000 mg | Freq: Once | INTRAVENOUS | Status: AC
  Administered 2016-11-01: 500 mg via INTRAVENOUS
  Filled 2016-11-01 (×2): qty 500

## 2016-11-01 MED ORDER — DEXTROSE 5 % IV SOLN
1.0000 g | Freq: Once | INTRAVENOUS | Status: AC
  Administered 2016-11-01: 23:00:00 1 g via INTRAVENOUS
  Filled 2016-11-01: qty 1

## 2016-11-01 MED ORDER — IPRATROPIUM-ALBUTEROL 0.5-2.5 (3) MG/3ML IN SOLN
3.0000 mL | RESPIRATORY_TRACT | Status: DC | PRN
  Administered 2016-11-02 – 2016-11-03 (×2): 3 mL via RESPIRATORY_TRACT
  Filled 2016-11-01 (×2): qty 3

## 2016-11-01 MED ORDER — VANCOMYCIN HCL IN DEXTROSE 1-5 GM/200ML-% IV SOLN
1000.0000 mg | Freq: Once | INTRAVENOUS | Status: AC
  Administered 2016-11-01: 1000 mg via INTRAVENOUS
  Filled 2016-11-01: qty 200

## 2016-11-01 MED ORDER — VANCOMYCIN HCL 10 G IV SOLR
1500.0000 mg | INTRAVENOUS | Status: DC
  Administered 2016-11-02 – 2016-11-03 (×2): 1500 mg via INTRAVENOUS
  Filled 2016-11-01 (×4): qty 1500

## 2016-11-01 MED ORDER — DEXTROSE 5 % IV SOLN
500.0000 mg | Freq: Once | INTRAVENOUS | Status: AC
  Administered 2016-11-01: 500 mg via INTRAVENOUS
  Filled 2016-11-01: qty 500

## 2016-11-01 MED ORDER — DEXTROSE 5 % IV SOLN
1.0000 g | Freq: Once | INTRAVENOUS | Status: AC
  Administered 2016-11-01: 1 g via INTRAVENOUS
  Filled 2016-11-01: qty 1

## 2016-11-01 MED ORDER — NALOXONE HCL 2 MG/2ML IJ SOSY
0.4000 mg | PREFILLED_SYRINGE | Freq: Once | INTRAMUSCULAR | Status: AC
  Administered 2016-11-01: 0.4 mg via INTRAVENOUS
  Filled 2016-11-01: qty 2

## 2016-11-01 MED ORDER — ATORVASTATIN CALCIUM 10 MG PO TABS
10.0000 mg | ORAL_TABLET | Freq: Every day | ORAL | Status: DC
  Administered 2016-11-02 – 2016-11-14 (×11): 10 mg via ORAL
  Filled 2016-11-01 (×11): qty 1

## 2016-11-01 MED ORDER — DEXTROSE 5 % IV SOLN
2.0000 g | Freq: Two times a day (BID) | INTRAVENOUS | Status: DC
  Administered 2016-11-02 – 2016-11-05 (×8): 2 g via INTRAVENOUS
  Filled 2016-11-01 (×10): qty 2

## 2016-11-01 MED ORDER — CEFTRIAXONE SODIUM IN DEXTROSE 20 MG/ML IV SOLN: 1.0000 g | Freq: Once | INTRAVENOUS | Status: DC

## 2016-11-01 MED ORDER — PANTOPRAZOLE SODIUM 40 MG PO TBEC
40.0000 mg | DELAYED_RELEASE_TABLET | Freq: Every day | ORAL | Status: DC
  Administered 2016-11-02 – 2016-11-08 (×6): 40 mg via ORAL
  Filled 2016-11-01 (×6): qty 1

## 2016-11-01 MED ORDER — IPRATROPIUM-ALBUTEROL 0.5-2.5 (3) MG/3ML IN SOLN
3.0000 mL | Freq: Once | RESPIRATORY_TRACT | Status: AC
  Administered 2016-11-01: 3 mL via RESPIRATORY_TRACT
  Filled 2016-11-01: qty 3

## 2016-11-01 MED ORDER — INSULIN ASPART 100 UNIT/ML ~~LOC~~ SOLN
0.0000 [IU] | Freq: Four times a day (QID) | SUBCUTANEOUS | Status: DC
  Administered 2016-11-02: 12:00:00 1 [IU] via SUBCUTANEOUS
  Filled 2016-11-01: qty 1

## 2016-11-01 MED ORDER — ACETAMINOPHEN 650 MG RE SUPP
650.0000 mg | Freq: Four times a day (QID) | RECTAL | Status: DC | PRN
  Administered 2016-11-07 – 2016-11-10 (×3): 650 mg via RECTAL
  Filled 2016-11-01 (×3): qty 1

## 2016-11-01 MED ORDER — HEPARIN SODIUM (PORCINE) 5000 UNIT/ML IJ SOLN
5000.0000 [IU] | Freq: Three times a day (TID) | INTRAMUSCULAR | Status: DC
  Administered 2016-11-02 – 2016-11-14 (×37): 5000 [IU] via SUBCUTANEOUS
  Filled 2016-11-01 (×37): qty 1

## 2016-11-01 NOTE — ED Notes (Signed)
No response with narcan  md aware.

## 2016-11-01 NOTE — ED Notes (Signed)
Nurse unable to take report at this time.

## 2016-11-01 NOTE — Progress Notes (Addendum)
ANTIBIOTIC CONSULT NOTE - INITIAL  Pharmacy Consult for Cefepime, Vancomycin  Indication: sepsis  No Known Allergies  Patient Measurements: Height: 6\' 2"  (188 cm) Weight: 300 lb (136.1 kg) IBW/kg (Calculated) : 82.2 Adjusted Body Weight: 103.8 kg   Vital Signs: Temp: 99.7 F (37.6 C) (11/01 1721) Temp Source: Oral (11/01 1721) BP: 94/52 (11/01 2100) Pulse Rate: 85 (11/01 2100) Intake/Output from previous day: No intake/output data recorded. Intake/Output from this shift: No intake/output data recorded.  Labs:  Recent Labs  11/01/16 1733  WBC 11.9*  HGB 12.3*  PLT 164  CREATININE 2.83*   Estimated Creatinine Clearance: 51.5 mL/min (A) (by C-G formula based on SCr of 2.83 mg/dL (H)). No results for input(s): VANCOTROUGH, VANCOPEAK, VANCORANDOM, GENTTROUGH, GENTPEAK, GENTRANDOM, TOBRATROUGH, TOBRAPEAK, TOBRARND, AMIKACINPEAK, AMIKACINTROU, AMIKACIN in the last 72 hours.   Microbiology: No results found for this or any previous visit (from the past 720 hour(s)).  Medical History: Past Medical History:  Diagnosis Date  . Anemia   . Asthma   . Diabetes mellitus   . Morbid obesity ([place])   . Paranoid schizophrenia ([place])   . Personality disorder ([place])   . Sleep apnea   . Thyroid disease     Medications:   (Not in a hospital admission) Assessment: CrCl = 51.43 ml/min Ke = 0.047 hr-1 T1/2 = 14.74 hrs Vd = 72.7 L   Goal of Therapy:  Vancomycin trough level 15-20 mcg/ml  Plan:  Expected duration 7 days with resolution of temperature and/or normalization of WBC   Cefepime 1 gm IV X 1 given in ED on 11/1 @ 19:28. Additional cefepime 1 gm IV X 1 ordered to be given on 11/1 @ 22:00 to make total starting dose of 2 gm. Cefepime 2 gm IV Q12H ordered to start 11/2 @ 0700.   Vancomycin 1 gm IV X 1 given on 11/1 @ 19:30.  Vancomycin 500 mg IV X 1 given on 11/1 @ 22:00 to make total starting dose of 1500 mg. Vancomycin 1500 mg IV Q18H to start on 11/2 @ 0200, ~ 6  hrs after 1st dose (stacked dosing). This pt will reach Css by 11/4 @ 19:00.  Will draw 1st trough on 11/5 @ 0130, which will be at Css.   11/1 Vanc dose and level times adjusted due to delay in administration of first doses. mm  Robbins,Jason D 11/01/2016,9:56 PM

## 2016-11-01 NOTE — ED Triage Notes (Signed)
Pt brought in via ems from a group home with ams.  Ems report giving narcan nasally en route with some results   Pt has sleep apnea.  Pt awake and talking.

## 2016-11-01 NOTE — Progress Notes (Signed)
CODE SEPSIS - PHARMACY COMMUNICATION  **Broad Spectrum Antibiotics should be administered within 1 hour of Sepsis diagnosis**  Time Code Sepsis Called/Page Received: 19:31  Antibiotics Ordered: Cefepime and vancomycin  Time of 1st antibiotic administration: 19:28  Additional action taken by pharmacy: none  If necessary, Name of Provider/Nurse Contacted: none   Laural Benes ,PharmD, BCPS Clinical Pharmacist  11/01/2016  7:46 PM

## 2016-11-01 NOTE — ED Notes (Signed)
meds given  Breathing treatment given to pt.  Pt reports he fell 2 days ago and struck the floor  Unsure loc.

## 2016-11-01 NOTE — ED Notes (Signed)
Pt remains hypotensive and sleepy.  Arousable.  Pt on 2 liters oxygen.  meds infusing.  Foley cath inserted without diff.  ua to lab.  Pt waiting on admission.

## 2016-11-01 NOTE — ED Notes (Signed)
Pt still sleepy and lethargic.  md at bedside.  Iv fluids infusing and meds infusing.  Pt hypotensive  md at bedside.  nsr on moniotor

## 2016-11-01 NOTE — ED Notes (Signed)
Pt brought in from group home with lethargy and altered mental status.  Ems gave narcan nasally with some results.  On arrival to treatment room  Pt sleepy.  nsr on monitor.  Pt awake and talking  Pt has sleep apnea and placed on 2 liters oxygen .

## 2016-11-01 NOTE — H&P (Signed)
[facility] NAME: John [name]    MR#:  [id number]  DATE OF BIRTH:  1975-05-04  DATE OF ADMISSION:  11/01/2016  PRIMARY CARE PHYSICIAN: Cletis Athens, MD   REQUESTING/REFERRING PHYSICIAN: Alfred Levins, MD  CHIEF COMPLAINT:   Chief Complaint  Patient presents with  . Altered Mental Status    HISTORY OF PRESENT ILLNESS:  John [name]  is a 41 y.o. male who presents with somnolence.  Patient is unable to contribute to his HPI here due to the same.  He is able to wake up to verbal command, but drifts quickly back to lethargic state and is unable to answer questions in a coherent way.  Workup in the ED shows significant vascular congestion on chest x-ray given the patient has no prior history of heart failure.  He also shows significant AKI.  Patient is on a number of psychiatric medications including lithium.  His blood pressure was initially low in the ED, proved some with fluids.  Narcan in the ED did not improve his mental status.  He was started on antibiotics and hospitalist were called for admission  PAST MEDICAL HISTORY:   Past Medical History:  Diagnosis Date  . Anemia   . Asthma   . Diabetes mellitus   . Morbid obesity ([place])   . Paranoid schizophrenia ([place])   . Personality disorder ([place])   . Sleep apnea   . Thyroid disease     PAST SURGICAL HISTORY:   Past Surgical History:  Procedure Laterality Date  . FRACTURE SURGERY      SOCIAL HISTORY:   Social History  Substance Use Topics  . Smoking status: Former Research scientist (life sciences)  . Smokeless tobacco: Never Used  . Alcohol use Yes     Comment: only once in a while    FAMILY HISTORY:   Family History  Problem Relation Age of Onset  . Glaucoma Mother     DRUG ALLERGIES:  No Known Allergies  MEDICATIONS AT HOME:   Prior to Admission medications   Medication Sig Start Date End Date Taking? Authorizing Provider  atenolol (TENORMIN) 100 MG tablet Take 100 mg by mouth  daily.   Yes [provider]  atorvastatin (LIPITOR) 10 MG tablet Take 10 mg by mouth daily.   Yes [provider]  clobetasol cream (TEMOVATE) 8.93 % Apply 1 application topically 2 (two) times daily as needed.    Yes [provider]  clonazePAM (KLONOPIN) 0.5 MG tablet Take 0.5 mg by mouth 2 (two) times daily.    Yes [provider]  clozapine (CLOZARIL) 200 MG tablet Take 1 tablet (200 mg total) by mouth daily. Patient taking differently: Take 300 mg by mouth daily.  11/28/15  Yes Robbie Lis, MD  divalproex (DEPAKOTE ER) 500 MG 24 hr tablet Take 2,000 mg by mouth at bedtime.    Yes [provider]  fluocinolone (SYNALAR) 0.01 % external solution Apply 1 mL topically 2 (two) times daily as needed. Apply to scalp, neck and ears.    Yes [provider]  furosemide (LASIX) 20 MG tablet Take 3 tablets (60 mg total) by mouth daily. 04/28/16  Yes Sudini, Alveta Heimlich, MD  glipiZIDE (GLUCOTROL) 5 MG tablet Take 0.5 tablets (2.5 mg total) by mouth daily before breakfast. Patient taking differently: Take 2.5 mg by mouth 2 (two) times daily before a meal.  04/28/16  Yes Sudini, Srikar, MD  latanoprost (XALATAN) 0.005 % ophthalmic solution Place 1 drop  into both eyes at bedtime.   Yes [provider]  levothyroxine (SYNTHROID, LEVOTHROID) 75 MCG tablet Take 75 mcg by mouth daily.   Yes [provider]  lithium carbonate 300 MG capsule Take 300 mg by mouth 2 (two) times daily.   Yes [provider]  loratadine (CLARITIN) 10 MG tablet Take 10 mg by mouth daily.   Yes [provider]  losartan (COZAAR) 50 MG tablet Take 50 mg by mouth daily.   Yes [provider]  meloxicam (MOBIC) 7.5 MG tablet Take 7.5 mg by mouth daily.   Yes [provider]  metFORMIN (GLUCOPHAGE) 1000 MG tablet Take 1,000 mg by mouth 2 (two) times daily with a meal.   Yes [provider]  Multiple Vitamins-Minerals (THEREMS-M)  TABS Take 1 tablet by mouth daily.   Yes [provider]  omeprazole (PRILOSEC) 20 MG capsule Take 20 mg by mouth daily.   Yes [provider]  oxybutynin (DITROPAN) 5 MG tablet Take 5 mg by mouth daily.   Yes [provider]  triamcinolone cream (KENALOG) 0.1 % Apply 1 application topically 2 (two) times daily as needed.    Yes [provider]    REVIEW OF SYSTEMS:  Review of Systems  Unable to perform ROS: Acuity of condition     VITAL SIGNS:   Vitals:   11/01/16 2015 11/01/16 2030 11/01/16 2045 11/01/16 2100  BP: (!) 75/34 (!) 87/40 100/66 (!) 94/52  Pulse: 82 84 85 85  Resp: 18 18 17 16   Temp:      TempSrc:      SpO2: 100% 100% 100% 98%  Weight:      Height:       Wt Readings from Last 3 Encounters:  11/01/16 136.1 kg (300 lb)  04/27/16 (!) 151.4 kg (333 lb 12.8 oz)  12/28/15 (!) 147 kg (324 lb 1.6 oz)    PHYSICAL EXAMINATION:  Physical Exam  Vitals reviewed. Constitutional: He appears well-developed and well-nourished. No distress.  HENT:  Head: Normocephalic and atraumatic.  Mouth/Throat: Oropharynx is clear and moist.  Eyes: Pupils are equal, round, and reactive to light. Conjunctivae and EOM are normal. No scleral icterus.  Neck: Normal range of motion. Neck supple. No JVD present. No thyromegaly present.  Cardiovascular: Normal rate, regular rhythm and intact distal pulses.  Exam reveals no gallop and no friction rub.   No murmur heard. Respiratory: He is in respiratory distress. He has no wheezes. He has rales.  GI: Soft. Bowel sounds are normal. He exhibits no distension. There is no tenderness.  Musculoskeletal: Normal range of motion. He exhibits no edema.  No arthritis, no gout  Lymphadenopathy:    He has no cervical adenopathy.  Neurological:  Unable to fully assess due to somnolence  Skin: Skin is warm and dry. No rash noted. No erythema.  Psychiatric:  Unable to fully assess due to somnolence    LABORATORY  PANEL:   CBC  Recent Labs Lab 11/01/16 1733  WBC 11.9*  HGB 12.3*  HCT 37.1*  PLT 164   ------------------------------------------------------------------------------------------------------------------  Chemistries   Recent Labs Lab 11/01/16 1733  NA 137  K 3.3*  CL 99*  CO2 28  GLUCOSE 93  BUN 21*  CREATININE 2.83*  CALCIUM 8.3*  AST 29  ALT 16*  ALKPHOS 32*  BILITOT 0.9   ------------------------------------------------------------------------------------------------------------------  Cardiac Enzymes No results for input(s): TROPONINI in the last 168 hours. ------------------------------------------------------------------------------------------------------------------  RADIOLOGY:  Ct Head Wo Contrast  Result Date: 11/01/2016 CLINICAL DATA:  Headache EXAM: CT HEAD WITHOUT CONTRAST TECHNIQUE: Contiguous axial images were obtained from the base of the skull through the vertex without intravenous contrast. COMPARISON:  04/27/2016 FINDINGS: Brain: No evidence of acute infarction, hemorrhage, hydrocephalus, extra-axial collection or mass lesion/mass effect. Vascular: No hyperdense vessel or unexpected calcification. Skull: Normal. Negative for fracture or focal lesion. Sinuses/Orbits: No acute finding. Other: None IMPRESSION: No acute intracranial abnormality. Electronically Signed   By: Ashley Royalty M.D.   On: 11/01/2016 18:39   Dg Chest Portable 1 View  Result Date: 11/01/2016 CLINICAL DATA:  Lethargy and altered mental status. EXAM: PORTABLE CHEST 1 VIEW COMPARISON:  04/27/2016 FINDINGS: 1742 hours. Low volumes. The cardio pericardial silhouette is enlarged. There is pulmonary vascular congestion without overt pulmonary edema. The visualized bony structures of the thorax are intact. Telemetry leads overlie the chest. IMPRESSION: Low volume film with cardiomegaly and vascular congestion. Electronically Signed   By: Misty Stanley M.D.   On: 11/01/2016 18:01    EKG:    Orders placed or performed during the hospital encounter of 11/01/16  . ED EKG  . ED EKG    IMPRESSION AND PLAN:  Principal Problem:   Sepsis ([place]) -mixed picture for sepsis initially, he certainly could have something like pneumonia, we are covering him with antibiotics, his lactic acid was within normal limits, blood pressure was soft though, IV fluids are helping on that front, cultures sent from the ED Active Problems:   Somnolence -unclear etiology, possibly due to sepsis as above, although possibly due to lithium toxicity given his acute kidney injury, lithium level pending, valproic acid level pending   AKI (acute kidney injury) ([place]) -unclear etiology for the same, we will hydrate with fluids as above, and avoid nephrotoxins.  If his renal function does not start improving we will get a nephrology consult, or if his lithium level is significantly elevated   Diabetes mellitus due to underlying condition without complication, without long-term current use of insulin ([name]) -sliding scale insulin with corresponding glucose checks   Essential hypertension -hold antihypertensives for now as the patient's blood pressure is low   OSA (obstructive sleep apnea) -BiPAP nightly   Hypothyroidism -home dose thyroid replacement  All the records are reviewed and case discussed with ED provider. Management plans discussed with the patient and/or family.  DVT PROPHYLAXIS: SubQ heparin  GI PROPHYLAXIS: PPI  ADMISSION STATUS: Inpatient  CODE STATUS: Full Code Status History    Date Active Date Inactive Code Status Order ID Comments User Context   12/20/2015  4:14 PM 12/28/2015  7:27 PM Full Code [id number]  Vilinda Boehringer, MD ED   11/25/2015 10:37 AM 11/28/2015  9:34 PM Full Code [id number]  Robbie Lis, MD Inpatient      TOTAL TIME TAKING CARE OF THIS PATIENT: 45 minutes.   Jannifer Franklin, Ebelyn Bohnet FIELDING 11/01/2016, 9:05 PM  [employer]  [phone]  CC: Primary  care physician; Cletis Athens, MD  Note:  This document was prepared using Dragon voice recognition software and may include unintentional dictation errors.

## 2016-11-01 NOTE — ED Provider Notes (Signed)
[facility] Emergency Department Provider Note  ____________________________________________  Time seen: Approximately 5:40 PM  I have reviewed the triage vital signs and the nursing notes.   HISTORY  Chief Complaint Altered Mental Status  Level 5 caveat:  Portions of the history and physical were unable to be obtained due to AMS   HPI John [name]. is a 41 y.o. male with a history of diabetes, asthma, obstructive sleep apnea, and hypothyroidism who presents from his group home for altered mental status. Patient went to his day care program and upon arrival to the group home patient was very somnolent and altered. Normal blood glucose per EMS. Patient was hypoxic and has wheezing on exam. Patient has pinpoint pupils and was given intranasal Narcan with some improvement of his mental status. Patient is not on any narcotics. Patient at this time is very somnolent but arouses to name. He'll answer questions but falls asleep in the middle of sentences. There is no signs of trauma.  Past Medical History:  Diagnosis Date  . Anemia   . Asthma   . Diabetes mellitus   . Morbid obesity ([place])   . Paranoid schizophrenia ([place])   . Personality disorder ([place])   . Sleep apnea   . Thyroid disease     Patient Active Problem List   Diagnosis Date Noted  . Sepsis ([place]) 04/27/2016  . Respiratory failure ([place]) 12/20/2015  . Cardiomyopathy ([place]) 11/28/2015  . Tobacco abuse 11/28/2015  . Acute respiratory failure with hypercapnia ([place])   . OSA (obstructive sleep apnea)   . Essential hypertension 11/25/2015  . Hypothyroidism 11/25/2015  . Paranoid schizophrenia, chronic condition ([place]) 11/25/2015  . Acute respiratory failure with hypoxia ([place])   . Acute exacerbation of COPD with asthma ([place])   . Dyslipidemia associated with type 2 diabetes mellitus ([place])   . Diabetes mellitus due to underlying condition without complication, without long-term current use of insulin  ([place])   . Morbid obesity ([place]) 09/29/2008    Past Surgical History:  Procedure Laterality Date  . FRACTURE SURGERY      Prior to Admission medications   Medication Sig Start Date End Date Taking? Authorizing Provider  atenolol (TENORMIN) 100 MG tablet Take 100 mg by mouth daily.   Yes [provider]  atorvastatin (LIPITOR) 10 MG tablet Take 10 mg by mouth daily.   Yes [provider]  clobetasol cream (TEMOVATE) 7.34 % Apply 1 application topically 2 (two) times daily as needed.    Yes [provider]  clonazePAM (KLONOPIN) 0.5 MG tablet Take 0.5 mg by mouth 2 (two) times daily.    Yes [provider]  clozapine (CLOZARIL) 200 MG tablet Take 1 tablet (200 mg total) by mouth daily. Patient taking differently: Take 300 mg by mouth daily.  11/28/15  Yes Robbie Lis, MD  divalproex (DEPAKOTE ER) 500 MG 24 hr tablet Take 2,000 mg by mouth at bedtime.    Yes [provider]  fluocinolone (SYNALAR) 0.01 % external solution Apply 1 mL topically 2 (two) times daily as needed. Apply to scalp, neck and ears.    Yes [provider]  furosemide (LASIX) 20 MG tablet Take 3 tablets (60 mg total) by mouth daily. 04/28/16  Yes Sudini, Alveta Heimlich, MD  glipiZIDE (GLUCOTROL) 5 MG tablet Take 0.5 tablets (2.5 mg total) by mouth daily before breakfast. Patient taking differently: Take 2.5 mg by mouth 2 (two) times daily before a meal.  04/28/16  Yes Sudini,  Srikar, MD  latanoprost (XALATAN) 0.005 % ophthalmic solution Place 1 drop into both eyes at bedtime.   Yes [provider]  levothyroxine (SYNTHROID, LEVOTHROID) 75 MCG tablet Take 75 mcg by mouth daily.   Yes [provider]  lithium carbonate 300 MG capsule Take 300 mg by mouth 2 (two) times daily.   Yes [provider]  loratadine (CLARITIN) 10 MG tablet Take 10 mg by mouth daily.   Yes [provider]  losartan (COZAAR) 50 MG tablet Take 50 mg by mouth daily.   Yes  [provider]  meloxicam (MOBIC) 7.5 MG tablet Take 7.5 mg by mouth daily.   Yes [provider]  metFORMIN (GLUCOPHAGE) 1000 MG tablet Take 1,000 mg by mouth 2 (two) times daily with a meal.   Yes [provider]  Multiple Vitamins-Minerals (THEREMS-M) TABS Take 1 tablet by mouth daily.   Yes [provider]  omeprazole (PRILOSEC) 20 MG capsule Take 20 mg by mouth daily.   Yes [provider]  oxybutynin (DITROPAN) 5 MG tablet Take 5 mg by mouth daily.   Yes [provider]  triamcinolone cream (KENALOG) 0.1 % Apply 1 application topically 2 (two) times daily as needed.    Yes [provider]    Allergies Patient has no known allergies.  Family History  Problem Relation Age of Onset  . Glaucoma Mother     Social History Social History  Substance Use Topics  . Smoking status: Former Research scientist (life sciences)  . Smokeless tobacco: Never Used  . Alcohol use Yes     Comment: only once in a while    Review of Systems  + confusion + hypoxia  Level 5 caveat:  Portions of the history and physical were unable to be obtained due to ams  ____________________________________________   PHYSICAL EXAM:  VITAL SIGNS: ED Triage Vitals  Enc Vitals Group     BP 11/01/16 1725 105/86     Pulse Rate 11/01/16 1721 91     Resp 11/01/16 1721 18     Temp 11/01/16 1721 99.7 F (37.6 C)     Temp Source 11/01/16 1721 Oral     SpO2 11/01/16 1721 90 %     Weight 11/01/16 1722 300 lb (136.1 kg)     Height 11/01/16 1722 6\' 2"  (1.88 m)     Head Circumference --      Peak Flow --      Pain Score --      Pain Loc --      Pain Edu? --      Excl. in [place]? --     Constitutional: Snoring, arouses to name, answers questions but falls asleep in the middle of sentences. HEENT:      Head: Normocephalic and atraumatic.         Eyes: Conjunctivae are normal. Sclera is non-icteric. Pinpoint pupils      Mouth/Throat: Mucous membranes are moist.       Neck:  Supple with no signs of meningismus. Cardiovascular: Regular rate and rhythm. No murmurs, gallops, or rubs. 2+ symmetrical distal pulses are present in all extremities. No JVD. Respiratory: Snoring, hypoxic, diffuse wheezing and coarse rhonchi bilaterally Gastrointestinal: Soft, non tender, and non distended with positive bowel sounds. No rebound or guarding. Musculoskeletal: Nontender with normal range of motion in all extremities. No edema, cyanosis, or erythema of extremities. Neurologic: opens eyes to his name, moves all extremities to command, answers to questions appropriately for falls asleep in  the middle of his sentences. Skin: Skin is warm, dry and intact. No rash noted.   ____________________________________________   LABS (all labs ordered are listed, but only abnormal results are displayed)  Labs Reviewed  BLOOD GAS, VENOUS - Abnormal; Notable for the following:       Result Value   pO2, Ven 97.0 (*)    Bicarbonate 32.1 (*)    Acid-Base Excess 5.8 (*)    All other components within normal limits  CBC WITH DIFFERENTIAL/PLATELET - Abnormal; Notable for the following:    WBC 11.9 (*)    Hemoglobin 12.3 (*)    HCT 37.1 (*)    RDW 14.8 (*)    Neutro Abs 9.2 (*)    All other components within normal limits  COMPREHENSIVE METABOLIC PANEL - Abnormal; Notable for the following:    Potassium 3.3 (*)    Chloride 99 (*)    BUN 21 (*)    Creatinine, Ser 2.83 (*)    Calcium 8.3 (*)    ALT 16 (*)    Alkaline Phosphatase 32 (*)    GFR calc non Af Amer 27 (*)    GFR calc Af Amer 31 (*)    All other components within normal limits  CULTURE, BLOOD (ROUTINE X 2)  CULTURE, BLOOD (ROUTINE X 2)  ETHANOL  TSH  LACTIC ACID, PLASMA  URINALYSIS, COMPLETE (UACMP) WITH MICROSCOPIC  URINE DRUG SCREEN, QUALITATIVE ([facility] ONLY)  CBG MONITORING, ED   ____________________________________________  EKG  ED ECG REPORT I, Rudene Re, the attending physician, personally viewed and  interpreted this ECG.  Normal sinus rhythm, rate of 83, normal PR and QRS intervals, prolonged QTC, normal axis, no ST elevations or depressions. ____________________________________________  RADIOLOGY  CXR:  Low volume film with cardiomegaly and vascular congestion  CT head: No acute intracranial abnormality. ____________________________________________   PROCEDURES  Procedure(s) performed: none Procedures Critical Care performed: yes  CRITICAL CARE Performed by: Rudene Re  ?  Total critical care time: 45 min  Critical care time was exclusive of separately billable procedures and treating other patients.  Critical care was necessary to treat or prevent imminent or life-threatening deterioration.  Critical care was time spent personally by me on the following activities: development of treatment plan with patient and/or surrogate as well as nursing, discussions with consultants, evaluation of patient's response to treatment, examination of patient, obtaining history from patient or surrogate, ordering and performing treatments and interventions, ordering and review of laboratory studies, ordering and review of radiographic studies, pulse oximetry and re-evaluation of patient's condition.   ____________________________________________   INITIAL IMPRESSION / ASSESSMENT AND PLAN / ED COURSE   41 y.o. male with a history of diabetes, asthma, obstructive sleep apnea, and hypothyroidism who presents from his group home for altered mental status. patient is arousable to name but falls asleep in the middle of his sentences, he is hypoxic but does have a history of sleep apnea, he has diffuse wheezes and coarse rhonchi bilaterally. He is afebrile with no tachycardia. Pupils are pinpoint and since patient responded to Narcan will give another dose of 0.4 mg of IV Narcan. We'll check a VBG for hypercarbia. We'll check drug screen and alcohol level. We'll check chest x-ray for  pneumonia. If no obvious etiology found we'll do CT head.  Clinical Course as of Nov 02 1998  Thu Nov 01, 2016  1824 No response to narcan. Will perform STAT head CT  [CV]  1902 CT head negative, Patient continues to be  very somnolent but arousable. Maintaining his airway, will continue to close monitor it. Patient is now hypotensive. Has received 1L NS. Will give 2nd bolus. Unclear etiology at this time but concerning for dehydration/ AKI/ sepsis with WBC 11.9, creatinine of 2.83, coarse breath sounds, and low grade temp. Patient with similar presentations in the past in the setting of dehydration and sepsis. Will get lactate and administer broad spectrum abx.   [CV]    Clinical Course User Index [CV] Alfred Levins, [place], MD   _________________________ 7:57 PM on 11/01/2016 -----------------------------------------  Lactic acid normal. BP improving with IVF. Patient remains with GCS 13 and protecting his airway. Will admit to Hospitalist for dehydration and sepsis from CAP  As part of my medical decision making, I reviewed the following data within the [facility] notes reviewed and incorporated, Labs reviewed , EKG interpreted , Old EKG reviewed, Old chart reviewed, Radiograph reviewed , Discussed with admitting physician , Notes from prior ED visits and  Controlled Substance Database    Pertinent labs & imaging results that were available during my care of the patient were reviewed by me and considered in my medical decision making (see chart for details).    ____________________________________________   FINAL CLINICAL IMPRESSION(S) / ED DIAGNOSES  Final diagnoses:  Encephalopathy acute  Pneumonia due to infectious organism, unspecified laterality, unspecified part of lung  AKI (acute kidney injury) ([place])      NEW MEDICATIONS STARTED DURING THIS VISIT:  New Prescriptions   No medications on file     Note:  This document was prepared using  Dragon voice recognition software and may include unintentional dictation errors.    Rudene Re, MD 11/01/16 2000

## 2016-11-02 ENCOUNTER — Inpatient Hospital Stay
Admit: 2016-11-02 | Discharge: 2016-11-02 | Disposition: A | Discharge status: Home / Self Care | Payer: 59 | Attending: Internal Medicine | Admitting: Internal Medicine

## 2016-11-02 LAB — BASIC METABOLIC PANEL WITH GFR
Anion gap: 5 (ref 5–15)
BUN: 22 mg/dL — ABNORMAL HIGH (ref 6–20)
CO2: 28 mmol/L (ref 22–32)
Calcium: 7.3 mg/dL — ABNORMAL LOW (ref 8.9–10.3)
Chloride: 104 mmol/L (ref 101–111)
Creatinine, Ser: 1.97 mg/dL — ABNORMAL HIGH (ref 0.61–1.24)
GFR calc Af Amer: 48 mL/min — ABNORMAL LOW
GFR calc non Af Amer: 41 mL/min — ABNORMAL LOW
Glucose, Bld: 103 mg/dL — ABNORMAL HIGH (ref 65–99)
Potassium: 3.6 mmol/L (ref 3.5–5.1)
Sodium: 137 mmol/L (ref 135–145)

## 2016-11-02 LAB — GLUCOSE, CAPILLARY
GLUCOSE-CAPILLARY: 99 mg/dL (ref 65–99)
GLUCOSE-CAPILLARY: 116 mg/dL — AB (ref 65–99)
Glucose-Capillary: 106 mg/dL — ABNORMAL HIGH (ref 65–99)
Glucose-Capillary: 139 mg/dL — ABNORMAL HIGH (ref 65–99)
Glucose-Capillary: 85 mg/dL (ref 65–99)
Glucose-Capillary: 96 mg/dL (ref 65–99)

## 2016-11-02 LAB — CBC
HCT: 34.7 % — ABNORMAL LOW (ref 40.0–52.0)
Hemoglobin: 11.2 g/dL — ABNORMAL LOW (ref 13.0–18.0)
MCH: 27.3 pg (ref 26.0–34.0)
MCHC: 32.4 g/dL (ref 32.0–36.0)
MCV: 84.1 fL (ref 80.0–100.0)
Platelets: 133 K/uL — ABNORMAL LOW (ref 150–440)
RBC: 4.13 MIL/uL — ABNORMAL LOW (ref 4.40–5.90)
RDW: 15 % — ABNORMAL HIGH (ref 11.5–14.5)
WBC: 7.8 K/uL (ref 3.8–10.6)

## 2016-11-02 LAB — MRSA PCR SCREENING: MRSA by PCR: NEGATIVE

## 2016-11-02 LAB — HEMOGLOBIN A1C
Hgb A1c MFr Bld: 5.7 % — ABNORMAL HIGH (ref 4.8–5.6)
Mean Plasma Glucose: 116.89 mg/dL

## 2016-11-02 MED ORDER — CLOZAPINE 100 MG PO TABS
300.0000 mg | ORAL_TABLET | Freq: Every day | ORAL | Status: DC
  Administered 2016-11-02 – 2016-11-07 (×5): 300 mg via ORAL
  Filled 2016-11-02 (×6): qty 3

## 2016-11-02 MED ORDER — INSULIN ASPART 100 UNIT/ML ~~LOC~~ SOLN: 0.0000 [IU] | Freq: Every day | SUBCUTANEOUS | Status: DC

## 2016-11-02 MED ORDER — LITHIUM CARBONATE 300 MG PO CAPS
300.0000 mg | ORAL_CAPSULE | Freq: Two times a day (BID) | ORAL | Status: DC
  Administered 2016-11-03 – 2016-11-06 (×6): 300 mg via ORAL
  Filled 2016-11-02 (×8): qty 1

## 2016-11-02 MED ORDER — INSULIN ASPART 100 UNIT/ML ~~LOC~~ SOLN
0.0000 [IU] | Freq: Three times a day (TID) | SUBCUTANEOUS | Status: DC
  Administered 2016-11-03: 3 [IU] via SUBCUTANEOUS
  Administered 2016-11-03: 18:00:00 2 [IU] via SUBCUTANEOUS
  Administered 2016-11-04 – 2016-11-08 (×3): 1 [IU] via SUBCUTANEOUS
  Administered 2016-11-09 – 2016-11-13 (×3): 2 [IU] via SUBCUTANEOUS
  Administered 2016-11-14: 1 [IU] via SUBCUTANEOUS
  Filled 2016-11-02 (×9): qty 1

## 2016-11-02 MED ORDER — ORAL CARE MOUTH RINSE
15.0000 mL | Freq: Two times a day (BID) | OROMUCOSAL | Status: DC
  Administered 2016-11-02 – 2016-11-13 (×19): 15 mL via OROMUCOSAL

## 2016-11-02 MED ORDER — DIVALPROEX SODIUM ER 500 MG PO TB24
2000.0000 mg | ORAL_TABLET | Freq: Every day | ORAL | Status: DC
  Administered 2016-11-02 – 2016-11-13 (×8): 2000 mg via ORAL
  Filled 2016-11-02 (×14): qty 4

## 2016-11-02 NOTE — Clinical Social Work Note (Signed)
Clinical Social Work Assessment  Patient Details  Name: John [name]. MRN: [id number] Date of Birth: [birth year]  Date of referral:  11/02/16               Reason for consult:  Other (Comment Required) (From a Group Home)                Permission sought to share information with:  Facility Art therapist granted to share information::  Yes, Verbal Permission Granted  Name::        Agency::     Relationship::     Contact Information:     Housing/Transportation Living arrangements for the past 2 months:  Group Home Source of Information:  Patient, Facility, Parent Patient Interpreter Needed:  None Criminal Activity/Legal Involvement Pertinent to Current Situation/Hospitalization:  No - Comment as needed Significant Relationships:  Parents Lives with:  Facility Resident Do you feel safe going back to the place where you live?  Yes Need for family participation in patient care:  Yes (Comment)  Care giving concerns:  Patient is a resident at [facility] located at [address], [place] [address] (phone # [phone], fax # [phone]).     Social Worker assessment / plan:  Holiday representative (CSW) received consult that patient is from a group home. Per chart patient is from [facility]. CSW contacted [facility] house manager cell # [phone]. Per [facility] owner is Mr. Erskine Squibb cell # [phone]. Per [name] patient has been at the group home for over 1 year now and is independent with his ADLs. Per [name] patient walks without an assistive device at baseline and is on room air. Per [name] patient has a c-pap machine that he uses at night only at the group home. Per [name] patient was in the hospital in November 2017 and went to [facility] for short term rehab and came back to the group home in April 2018. Per [name] patient is his own guardian and his mother is his primary contact. Per [name] patient went to [facility] [employer] and  was looking forward to going to the homecoming game this weekend. CSW made [name] aware that per MD patient will likely not D/C before Saturday 11/03/16. Per [name] patient can return to the group home when stable and she will arrange transport. CSW also contacted patient's mother [name] who confirmed what [name] said. Per [name] she helps patient with paper work and finances. Per [name] she is not patient's legal guardian and he makes his own decisions. Per [name] patient went to [facility] however he had to drop out because of his mental health. Per [name] she helped place patient into a group home. [name] is agreeable for patient to return to the group home. CSW met with patient who was very pleasant and thanked CSW for visit. Patient reported that he is agreeable to return to the group home when stable. FL2 complete. CSW will continue to follow and assist as needed.   Employment status:  Disabled (Comment on whether or not currently receiving Disability) Insurance information:  Medicare, Managed Medicare PT Recommendations:  Not assessed at this time Information / Referral to community resources:  Other (Comment Required) (Patient will return to the group home. )  Patient/Family's Response to care:  Patient is agreeable to return to his group home.   Patient/Family's Understanding of and Emotional Response to Diagnosis, Current Treatment, and Prognosis:  Patient  was very pleasant and thanked CSW for assistance.   Emotional Assessment Appearance:  Appears stated age Attitude/Demeanor/Rapport:    Affect (typically observed):  Accepting, Adaptable, Pleasant Orientation:  Oriented to Self, Oriented to Place, Oriented to  Time, Oriented to Situation Alcohol / Substance use:  Not Applicable Psych involvement (Current and /or in the community):  No (Comment)  Discharge Needs  Concerns to be addressed:  Discharge Planning Concerns Readmission within the last 30 days:  No Current discharge risk:  Psychiatric  Illness Barriers to Discharge:  Continued Medical Work up   [employer], Veronia Beets, LCSW 11/02/2016, 10:47 AM

## 2016-11-02 NOTE — Care Management (Signed)
Received referral for discharge planning for John [name]. John [name] is a resident of [facility]. Clinical Social Worker updated.  Will continue for discharge needs, if ordered Shelbie Ammons RN MSN [facility] Management [phone]

## 2016-11-02 NOTE — NC FL2 (Signed)
[place] LEVEL OF CARE SCREENING TOOL     IDENTIFICATION  Patient Name: John [name]. Birthdate: 1975/08/02 Sex: male Admission Date (Current Location): 11/01/2016  [place] and [place] Number:  Selena Lesser  ([id number]) Facility and Address:  [facility], [address], [place], [place] [address]      Provider Number: [id number]  Attending Physician Name and Address:  Vaughan Basta, *  Relative Name and Phone Number:       Current Level of Care: Hospital Recommended Level of Care: Other (Comment) (Group Home. ) Prior Approval Number:    Date Approved/Denied:   PASRR Number:    Discharge Plan: Domiciliary (Rest home)    Current Diagnoses: Patient Active Problem List   Diagnosis Date Noted  . Somnolence 11/01/2016  . AKI (acute kidney injury) ([place]) 11/01/2016  . Sepsis ([place]) 04/27/2016  . Respiratory failure ([place]) 12/20/2015  . Cardiomyopathy ([place]) 11/28/2015  . Tobacco abuse 11/28/2015  . Acute respiratory failure with hypercapnia ([place])   . OSA (obstructive sleep apnea)   . Essential hypertension 11/25/2015  . Hypothyroidism 11/25/2015  . Paranoid schizophrenia, chronic condition ([place]) 11/25/2015  . Acute respiratory failure with hypoxia ([place])   . Acute exacerbation of COPD with asthma ([place])   . Dyslipidemia associated with type 2 diabetes mellitus ([place])   . Diabetes mellitus due to underlying condition without complication, without long-term current use of insulin ([place])   . Morbid obesity ([place]) 09/29/2008    Orientation RESPIRATION BLADDER Height & Weight     Self, Time, Situation, Place  Normal (C-pap at night ) Continent Weight: 300 lb (136.1 kg) Height:  6\' 2"  (188 cm)  BEHAVIORAL SYMPTOMS/MOOD NEUROLOGICAL BOWEL NUTRITION STATUS      Continent Diet (Diet: Heart Healthy/ Carb Modified )  AMBULATORY STATUS COMMUNICATION OF NEEDS Skin   Independent Verbally Normal                       Personal  Care Assistance Level of Assistance  Bathing, Feeding, Dressing Bathing Assistance: Independent Feeding assistance: Independent Dressing Assistance: Independent     Functional Limitations Info  Sight, Hearing, Speech Sight Info: Adequate Hearing Info: Adequate Speech Info: Adequate    SPECIAL CARE FACTORS FREQUENCY  PT (By licensed PT)     PT Frequency:  (home health PT 2-3 )              Contractures      Additional Factors Info  Code Status, Allergies Code Status Info:  (Full Code. ) Allergies Info:  (No Known Allergies. )           Current Medications (11/02/2016):  This is the current hospital active medication list Current Facility-Administered Medications  Medication Dose Route Frequency Provider Last Rate Last Dose  . acetaminophen (TYLENOL) tablet 650 mg  650 mg Oral Q6H PRN Lance Coon, MD       Or  . acetaminophen (TYLENOL) suppository 650 mg  650 mg Rectal Q6H PRN Lance Coon, MD      . atorvastatin (LIPITOR) tablet 10 mg  10 mg Oral Daily Lance Coon, MD      . ceFEPIme (MAXIPIME) 2 g in dextrose 5 % 50 mL IVPB  2 g Intravenous Laurence Spates, MD      . heparin injection 5,000 Units  5,000 Units Subcutaneous Camelia Phenes Lance Coon, MD      . insulin aspart (novoLOG) injection 0-9 Units  0-9 Units Subcutaneous Q6H Lance Coon,  MD      . ipratropium-albuterol (DUONEB) 0.5-2.5 (3) MG/3ML nebulizer solution 3 mL  3 mL Nebulization Q4H PRN Lance Coon, MD      . latanoprost (XALATAN) 0.005 % ophthalmic solution 1 drop  1 drop Both Eyes QHS Lance Coon, MD   1 drop at 11/01/16 2318  . levothyroxine (SYNTHROID, LEVOTHROID) tablet 75 mcg  75 mcg Oral QAC breakfast Lance Coon, MD      . MEDLINE mouth rinse  15 mL Mouth Rinse BID Lance Coon, MD      . ondansetron [facility]) tablet 4 mg  4 mg Oral Q6H PRN Lance Coon, MD       Or  . ondansetron [facility]) injection 4 mg  4 mg Intravenous Q6H PRN Lance Coon, MD      . pantoprazole (PROTONIX) EC  tablet 40 mg  40 mg Oral Daily Lance Coon, MD      . vancomycin (VANCOCIN) 1,500 mg in sodium chloride 0.9 % 500 mL IVPB  1,500 mg Intravenous Q18H Lance Coon, MD 250 mL/hr at 11/02/16 0741 1,500 mg at 11/02/16 0741     Discharge Medications: Please see discharge summary for a list of discharge medications.  Relevant Imaging Results:  Relevant Lab Results:   Additional Information  (SSN: [id number])  [employer], Veronia Beets, LCSW

## 2016-11-02 NOTE — Consult Note (Signed)
MEDICATION RELATED CONSULT NOTE - INITIAL   Pharmacy Consult for Clozapine Lab Monitoring and REMs Program reporting  Indication: Paranoid schizophrenia   No Known Allergies  Patient Measurements: Height: 6\' 2"  (188 cm) Weight: 300 lb (136.1 kg) IBW/kg (Calculated) : 82.2   Labs:  Recent Labs  11/01/16 1733 11/02/16 0441  WBC 11.9* 7.8  HGB 12.3* 11.2*  HCT 37.1* 34.7*  PLT 164 133*  CREATININE 2.83* 1.97*  ALBUMIN 3.7  --   PROT 7.7  --   AST 29  --   ALT 16*  --   ALKPHOS 32*  --   BILITOT 0.9  --   . Estimated Creatinine Clearance: 73.9 mL/min (A) (by C-G formula based on SCr of 1.97 mg/dL (H)).   Microbiology:   Medical History: Past Medical History:  Diagnosis Date  . Anemia   . Asthma   . Diabetes mellitus   . Morbid obesity ([place])   . Paranoid schizophrenia ([place])   . Personality disorder ([place])   . Sleep apnea   . Thyroid disease     Assessment: 41 yo male with PMH of paranoid schizophrenia. Pharmacy consulted for Clozapine lab monitoring and REMs Program reporting. Patients PTA dose is reported as Clozapine 300mg  QD.   11/1 ANC: 9200  Plan:  ANC was reported to Clozapine REMs program on 11/2 @ 1233. Patient is listed as eligible to receive clozapine with weekly lab monitoring.  Next CBC w/Diff 11/8.  Pharmacy will continue to follow per protocol.   [name], PharmD, BCPS Clinical Pharmacist 11/02/2016 12:35 PM

## 2016-11-02 NOTE — Plan of Care (Signed)
Problem: Education: Goal: Knowledge of [facility] Education information/materials will improve Outcome: Progressing Pt likes to be called John [name]  Past Medical History:  Diagnosis Date  . Anemia   . Asthma   . Diabetes mellitus   . Morbid obesity ([place])   . Paranoid schizophrenia ([place])   . Personality disorder ([place])   . Sleep apnea   . Thyroid disease    Pt is well controlled with home medications

## 2016-11-03 ENCOUNTER — Inpatient Hospital Stay: Payer: 59

## 2016-11-03 LAB — CBC
HEMATOCRIT: 33.6 % — AB (ref 40.0–52.0)
HEMOGLOBIN: 10.8 g/dL — AB (ref 13.0–18.0)
MCH: 26.8 pg (ref 26.0–34.0)
MCHC: 32.3 g/dL (ref 32.0–36.0)
MCV: 83.1 fL (ref 80.0–100.0)
Platelets: 140 10*3/uL — ABNORMAL LOW (ref 150–440)
RBC: 4.04 MIL/uL — ABNORMAL LOW (ref 4.40–5.90)
RDW: 14.9 % — ABNORMAL HIGH (ref 11.5–14.5)
WBC: 6 10*3/uL (ref 3.8–10.6)

## 2016-11-03 LAB — BASIC METABOLIC PANEL
ANION GAP: 7 (ref 5–15)
BUN: 12 mg/dL (ref 6–20)
CO2: 28 mmol/L (ref 22–32)
Calcium: 8.1 mg/dL — ABNORMAL LOW (ref 8.9–10.3)
Chloride: 103 mmol/L (ref 101–111)
Creatinine, Ser: 1.21 mg/dL (ref 0.61–1.24)
GFR calc non Af Amer: >60 mL/min (ref >60)
GLUCOSE: 107 mg/dL — AB (ref 65–99)
POTASSIUM: 3.6 mmol/L (ref 3.5–5.1)
SODIUM: 138 mmol/L (ref 135–145)

## 2016-11-03 LAB — ECHOCARDIOGRAM COMPLETE
HEIGHTINCHES: 74 in
WEIGHTICAEL: 4800 [oz_av]

## 2016-11-03 LAB — LITHIUM LEVEL: LITHIUM LVL: 0.92 mmol/L (ref 0.60–1.20)

## 2016-11-03 LAB — EXPECTORATED SPUTUM ASSESSMENT W GRAM STAIN, RFLX TO RESP C: Special Requests: NORMAL

## 2016-11-03 LAB — GLUCOSE, CAPILLARY
GLUCOSE-CAPILLARY: 95 mg/dL (ref 65–99)
GLUCOSE-CAPILLARY: 202 mg/dL — AB (ref 65–99)
GLUCOSE-CAPILLARY: 125 mg/dL — AB (ref 65–99)
Glucose-Capillary: 156 mg/dL — ABNORMAL HIGH (ref 65–99)

## 2016-11-03 LAB — INFLUENZA PANEL BY PCR (TYPE A & B)
INFLAPCR: NEGATIVE
Influenza B By PCR: NEGATIVE

## 2016-11-03 LAB — EXPECTORATED SPUTUM ASSESSMENT W REFEX TO RESP CULTURE

## 2016-11-03 LAB — PROCALCITONIN: PROCALCITONIN: 0.24 ng/mL

## 2016-11-03 MED ORDER — IPRATROPIUM-ALBUTEROL 0.5-2.5 (3) MG/3ML IN SOLN
3.0000 mL | RESPIRATORY_TRACT | Status: DC
  Administered 2016-11-03 – 2016-11-07 (×24): 3 mL via RESPIRATORY_TRACT
  Filled 2016-11-03 (×6): qty 3
  Filled 2016-11-03: qty 36
  Filled 2016-11-03 (×17): qty 3

## 2016-11-03 MED ORDER — FUROSEMIDE 10 MG/ML IJ SOLN
20.0000 mg | Freq: Once | INTRAMUSCULAR | Status: AC
  Administered 2016-11-03: 12:00:00 20 mg via INTRAVENOUS
  Filled 2016-11-03: qty 2

## 2016-11-03 MED ORDER — IBUPROFEN 400 MG PO TABS
400.0000 mg | ORAL_TABLET | Freq: Once | ORAL | Status: AC
  Administered 2016-11-03: 01:00:00 400 mg via ORAL
  Filled 2016-11-03: qty 1

## 2016-11-03 NOTE — Progress Notes (Addendum)
[facility] at [place] NAME: John [name]    MR#:  [id number]  DATE OF BIRTH:  12/02/1975  SUBJECTIVE:  CHIEF COMPLAINT:   Chief Complaint  Patient presents with  . Altered Mental Status   Came with AMS, found to have Dehydration and suspected for infection. Much alert today, still have some renal issues.  REVIEW OF SYSTEMS:  CONSTITUTIONAL: No fever, fatigue or weakness.  EYES: No blurred or double vision.  EARS, NOSE, AND THROAT: No tinnitus or ear pain.  RESPIRATORY: No cough, shortness of breath, wheezing or hemoptysis.  CARDIOVASCULAR: No chest pain, orthopnea, edema.  GASTROINTESTINAL: No nausea, vomiting, diarrhea or abdominal pain.  GENITOURINARY: No dysuria, hematuria.  ENDOCRINE: No polyuria, nocturia,  HEMATOLOGY: No anemia, easy bruising or bleeding SKIN: No rash or lesion. MUSCULOSKELETAL: No joint pain or arthritis.   NEUROLOGIC: No tingling, numbness, weakness.  PSYCHIATRY: No anxiety or depression.   ROS  DRUG ALLERGIES:  No Known Allergies  VITALS:  Blood pressure 134/71, pulse (!) 117, temperature 98.3 F (36.8 C), resp. rate (!) 24, height 6\' 2"  (1.88 m), weight (!) 147 kg (324 lb), SpO2 94 %.  PHYSICAL EXAMINATION:  GENERAL:  41 y.o.-year-old patient lying in the bed with no acute distress.  EYES: Pupils equal, round, reactive to light and accommodation. No scleral icterus. Extraocular muscles intact.  HEENT: Head atraumatic, normocephalic. Oropharynx and nasopharynx clear.  NECK:  Supple, no jugular venous distention. No thyroid enlargement, no tenderness.  LUNGS: Normal breath sounds bilaterally, no wheezing, rales,rhonchi or crepitation. No use of accessory muscles of respiration.  CARDIOVASCULAR: S1, S2 normal. No murmurs, rubs, or gallops.  ABDOMEN: Soft, nontender, nondistended. Bowel sounds present. No organomegaly or mass.  EXTREMITIES: No pedal edema, cyanosis, or clubbing.  NEUROLOGIC: Cranial nerves  II through XII are intact. Muscle strength 5/5 in all extremities. Sensation intact. Gait not checked.  PSYCHIATRIC: The patient is alert and oriented x 3.  SKIN: No obvious rash, lesion, or ulcer.   Physical Exam LABORATORY PANEL:   CBC  Recent Labs Lab 11/03/16 0534  WBC 6.0  HGB 10.8*  HCT 33.6*  PLT 140*   ------------------------------------------------------------------------------------------------------------------  Chemistries   Recent Labs Lab 11/01/16 1733  11/03/16 0534  NA 137  < > 138  K 3.3*  < > 3.6  CL 99*  < > 103  CO2 28  < > 28  GLUCOSE 93  < > 107*  BUN 21*  < > 12  CREATININE 2.83*  < > 1.21  CALCIUM 8.3*  < > 8.1*  AST 29  --   --   ALT 16*  --   --   ALKPHOS 32*  --   --   BILITOT 0.9  --   --   < > = values in this interval not displayed. ------------------------------------------------------------------------------------------------------------------  Cardiac Enzymes No results for input(s): TROPONINI in the last 168 hours. ------------------------------------------------------------------------------------------------------------------  RADIOLOGY:  Ct Head Wo Contrast  Result Date: 11/01/2016 CLINICAL DATA:  Headache EXAM: CT HEAD WITHOUT CONTRAST TECHNIQUE: Contiguous axial images were obtained from the base of the skull through the vertex without intravenous contrast. COMPARISON:  04/27/2016 FINDINGS: Brain: No evidence of acute infarction, hemorrhage, hydrocephalus, extra-axial collection or mass lesion/mass effect. Vascular: No hyperdense vessel or unexpected calcification. Skull: Normal. Negative for fracture or focal lesion. Sinuses/Orbits: No acute finding. Other: None IMPRESSION: No acute intracranial abnormality. Electronically Signed   By: Ashley Royalty M.D.   On: 11/01/2016 18:39  Dg Chest Portable 1 View  Result Date: 11/01/2016 CLINICAL DATA:  Lethargy and altered mental status. EXAM: PORTABLE CHEST 1 VIEW COMPARISON:   04/27/2016 FINDINGS: 1742 hours. Low volumes. The cardio pericardial silhouette is enlarged. There is pulmonary vascular congestion without overt pulmonary edema. The visualized bony structures of the thorax are intact. Telemetry leads overlie the chest. IMPRESSION: Low volume film with cardiomegaly and vascular congestion. Electronically Signed   By: Misty Stanley M.D.   On: 11/01/2016 18:01    ASSESSMENT AND PLAN:   Principal Problem:   Sepsis ([place]) Active Problems:   Dyslipidemia associated with type 2 diabetes mellitus ([place])   Diabetes mellitus due to underlying condition without complication, without long-term current use of insulin ([place])   Essential hypertension   Hypothyroidism   OSA (obstructive sleep apnea)   Somnolence   AKI (acute kidney injury) ([place])  * Sepsis ([place]) -mixed picture for sepsis initially,  like pneumonia,   Broad spectrum antibiotics, his lactic acid was within normal limits, blood pressure was soft though, IV fluids are helping on that front, cultures sent from the ED    Echo is ordered. *  Somnolence -unclear etiology, possibly due to sepsis as above, although possibly due to lithium toxicity given his acute kidney injury, lithium level slightly high, valproic acid level was low. *  AKI (acute kidney injury) ([place]) -dehydration,  hydrate with fluids as above, and avoid nephrotoxins.  *  Diabetes mellitus due to underlying condition without complication, without long-term current use of insulin ([name]) -sliding scale insulin with corresponding glucose checks   HBA1C is not high. *  Essential hypertension -hold antihypertensives for now as the patient's blood pressure is low *  OSA (obstructive sleep apnea) -BiPAP nightly *  Hypothyroidism -home dose thyroid replacement   All the records are reviewed and case discussed with Care Management/Social Workerr. Management plans discussed with the patient, family and they are in agreement.  CODE STATUS: Full.  TOTAL  TIME TAKING CARE OF THIS PATIENT: 35 minutes.     POSSIBLE D/C IN 1-2 DAYS, DEPENDING ON CLINICAL CONDITION.   Vaughan Basta M.D on 11/03/2016   Between 7am to 6pm - Pager - [phone]  After 6pm go to [email or web address] - password [email or web address] [facility]  Office  [phone]  CC: Primary care physician; Cletis Athens, MD  Note: This dictation was prepared with Dragon dictation along with smaller phrase technology. Any transcriptional errors that result from this process are unintentional.

## 2016-11-04 LAB — CBC
HEMATOCRIT: 34.7 % — AB (ref 40.0–52.0)
HEMOGLOBIN: 11.1 g/dL — AB (ref 13.0–18.0)
MCH: 26.9 pg (ref 26.0–34.0)
MCHC: 32.1 g/dL (ref 32.0–36.0)
MCV: 83.8 fL (ref 80.0–100.0)
Platelets: 158 10*3/uL (ref 150–440)
RBC: 4.14 MIL/uL — ABNORMAL LOW (ref 4.40–5.90)
RDW: 14.5 % (ref 11.5–14.5)
WBC: 5.4 10*3/uL (ref 3.8–10.6)

## 2016-11-04 LAB — BASIC METABOLIC PANEL
ANION GAP: 7 (ref 5–15)
BUN: 6 mg/dL (ref 6–20)
CHLORIDE: 101 mmol/L (ref 101–111)
CO2: 31 mmol/L (ref 22–32)
Calcium: 8.4 mg/dL — ABNORMAL LOW (ref 8.9–10.3)
Creatinine, Ser: 0.89 mg/dL (ref 0.61–1.24)
GFR calc non Af Amer: >60 mL/min (ref >60)
GLUCOSE: 121 mg/dL — AB (ref 65–99)
POTASSIUM: 3.7 mmol/L (ref 3.5–5.1)
Sodium: 139 mmol/L (ref 135–145)

## 2016-11-04 LAB — PROCALCITONIN: Procalcitonin: 0.12 ng/mL

## 2016-11-04 LAB — GLUCOSE, CAPILLARY
GLUCOSE-CAPILLARY: 124 mg/dL — AB (ref 65–99)
Glucose-Capillary: 118 mg/dL — ABNORMAL HIGH (ref 65–99)
Glucose-Capillary: 97 mg/dL (ref 65–99)
Glucose-Capillary: 121 mg/dL — ABNORMAL HIGH (ref 65–99)

## 2016-11-04 MED ORDER — ATENOLOL 50 MG PO TABS
50.0000 mg | ORAL_TABLET | Freq: Every day | ORAL | Status: DC
  Administered 2016-11-04 – 2016-11-14 (×9): 50 mg via ORAL
  Filled 2016-11-04: qty 2
  Filled 2016-11-04: qty 1
  Filled 2016-11-04: qty 2
  Filled 2016-11-04 (×2): qty 1
  Filled 2016-11-04 (×2): qty 2
  Filled 2016-11-04 (×2): qty 1

## 2016-11-04 MED ORDER — GUAIFENESIN-DM 100-10 MG/5ML PO SYRP
5.0000 mL | ORAL_SOLUTION | ORAL | Status: DC | PRN
  Administered 2016-11-04 – 2016-11-07 (×5): 5 mL via ORAL
  Filled 2016-11-04 (×7): qty 5

## 2016-11-04 NOTE — Progress Notes (Signed)
Pt wanted to take a nap, placed on bipap, RN  notified

## 2016-11-04 NOTE — Progress Notes (Signed)
[place] at [place] NAME: John [name]    MR#:  [id number]  DATE OF BIRTH:  12/02/1975  SUBJECTIVE:  CHIEF COMPLAINT:   Chief Complaint  Patient presents with  . Altered Mental Status   Came with AMS, found to have Dehydration and suspected for infection.  Productive cough is improving patient is sleepy today but arousable and answering questions appropriately. Uses CPAP at home  REVIEW OF SYSTEMS:  CONSTITUTIONAL: No fever, fatigue or weakness.  EYES: No blurred or double vision.  EARS, NOSE, AND THROAT: No tinnitus or ear pain.  RESPIRATORY: positive for cough, denies shortness of breath, no wheezing or hemoptysis.  CARDIOVASCULAR: No chest pain, orthopnea, edema.  GASTROINTESTINAL: No nausea, vomiting, diarrhea or abdominal pain.  GENITOURINARY: No dysuria, hematuria.  ENDOCRINE: No polyuria, nocturia,  HEMATOLOGY: No anemia, easy bruising or bleeding SKIN: No rash or lesion. MUSCULOSKELETAL: No joint pain or arthritis.   NEUROLOGIC: No tingling, numbness, weakness.  PSYCHIATRY: No anxiety or depression.   ROS  DRUG ALLERGIES:  No Known Allergies  VITALS:  Blood pressure 133/77, pulse (!) 114, temperature 98.2 F (36.8 C), temperature source Oral, resp. rate (!) 24, height 6\' 2"  (1.88 m), weight (!) 144.3 kg (318 lb 1.6 oz), SpO2 97 %.  PHYSICAL EXAMINATION:  GENERAL:  41 y.o.-year-old patient lying in the bed with no acute distress.  EYES: Pupils equal, round, reactive to light and accommodation. No scleral icterus. Extraocular muscles intact.  HEENT: Head atraumatic, normocephalic. Oropharynx and nasopharynx clear.  NECK:  Supple, no jugular venous distention. No thyroid enlargement, no tenderness.  LUNGS: mod breath sounds bilaterally, no wheezing, have b/l crepitation. No use of accessory muscles of respiration.  CARDIOVASCULAR: S1, S2 normal. No murmurs, rubs, or gallops.  ABDOMEN: Soft, nontender, nondistended. Bowel  sounds present. No organomegaly or mass.  EXTREMITIES: No pedal edema, cyanosis, or clubbing.  NEUROLOGIC: Cranial nerves II through XII are intact. Muscle strength 5/5 in all extremities. Sensation intact. Gait not checked.  PSYCHIATRIC: The patient is alert and oriented x 3.  SKIN: No obvious rash, lesion, or ulcer.   Physical Exam LABORATORY PANEL:   CBC Recent Labs  Lab 11/04/16 0410  WBC 5.4  HGB 11.1*  HCT 34.7*  PLT 158   ------------------------------------------------------------------------------------------------------------------  Chemistries  Recent Labs  Lab 11/01/16 1733  11/04/16 0410  NA 137   < > 139  K 3.3*   < > 3.7  CL 99*   < > 101  CO2 28   < > 31  GLUCOSE 93   < > 121*  BUN 21*   < > 6  CREATININE 2.83*   < > 0.89  CALCIUM 8.3*   < > 8.4*  AST 29  --   --   ALT 16*  --   --   ALKPHOS 32*  --   --   BILITOT 0.9  --   --    < > = values in this interval not displayed.   ------------------------------------------------------------------------------------------------------------------  Cardiac Enzymes No results for input(s): TROPONINI in the last 168 hours. ------------------------------------------------------------------------------------------------------------------  RADIOLOGY:  Dg Chest Port 1 View  Result Date: 11/03/2016 CLINICAL DATA:  41 year old male with a history of productive cough, former smoking, diabetes EXAM: PORTABLE CHEST 1 VIEW COMPARISON:  01/12/2016 FINDINGS: Cardiomediastinal silhouette unchanged in size and contour. Fullness in the central vasculature, similar to prior studies. Low lung volumes, accentuating the interstitium. Ill-defined airspace opacity in the right infrahilar region. No  pneumothorax or pleural effusion. IMPRESSION: Questionable airspace opacity in the right infrahilar region, potentially representing infection. Electronically Signed   By: Corrie Mckusick D.O.   On: 11/03/2016 11:09    ASSESSMENT AND PLAN:    Principal Problem:   Sepsis ([place]) Active Problems:   Dyslipidemia associated with type 2 diabetes mellitus ([place])   Diabetes mellitus due to underlying condition without complication, without long-term current use of insulin ([place])   Essential hypertension   Hypothyroidism   OSA (obstructive sleep apnea)   Somnolence   AKI (acute kidney injury) ([place])  * Sepsis ([place]) -  Pneumonia Clinically improving Sputum culture is growing bacteria, await final results  Broad spectrum antibiotics his lactic acid was within normal limits, blood pressure improved with IV fluids    Echo -ejection fraction 67%, grade 1 diastolic dysfunction, mild mitral regurgitation and tricuspid regurgitation   Repeat Xray showed developing pneumonia.  *  Somnolence -could be from sepsis and obstructive sleep apnea unclear etiology, possibly due to sepsis as above, although possibly due to lithium toxicity given his acute kidney injury, lithium level slightly high, at 1.29 high normal range is 1.20. Will repeat lithium levels in a.m. valproic acid level was low.  *  AKI (acute kidney injury) ([place]) -dehydration Resolved, creatinine at 0.89  hydrated with fluids as above, and avoid nephrotoxins.   * Acute hypoxic respi failure secondary to pneumonia  Patient has received 1 dose of IV Lasix for fluid overload Weaning of oxygen from 4 L to 2 L, wean off as tolerated  *  Diabetes mellitus due to underlying condition without complication, without long-term current use of insulin ([name]) -sliding scale insulin with corresponding glucose checks   HBA1C is 5.7  *  Essential hypertension -blood pressure is better restart atenolol at 50 mg, home dose was at 100 mg holding Cozaar  *  OSA (obstructive sleep apnea) -BiPAP nightly  *  Hypothyroidism -home dose thyroid replacement  Out of bed and ambulate the patient   All the records are reviewed and case discussed with Care Management/Social Workerr. Management plans  discussed with the patient,he is in agreement.  CODE STATUS: Full.  TOTAL TIME TAKING CARE OF THIS PATIENT: 35 minutes.     POSSIBLE D/C IN 1 DAYS, DEPENDING ON CLINICAL CONDITION.   Nicholes Mango M.D on 11/04/2016   Between 7am to 6pm - Pager - [phone]  After 6pm go to [email or web address] - password [email or web address] [facility]  Office  [phone]  CC: Primary care physician; Cletis Athens, MD  Note: This dictation was prepared with Dragon dictation along with smaller phrase technology. Any transcriptional errors that result from this process are unintentional.

## 2016-11-04 NOTE — Progress Notes (Signed)
[facility] NAME: John [name]    MR#:  [id number]  DATE OF BIRTH:  1975-07-19  SUBJECTIVE:  CHIEF COMPLAINT:   Chief Complaint  Patient presents with  . Altered Mental Status   Came with AMS, found to have Dehydration and suspected for infection. Much alert today, requiring more oxygen now. No chest pain. Have cough with sputum production now.  REVIEW OF SYSTEMS:  CONSTITUTIONAL: No fever, fatigue or weakness.  EYES: No blurred or double vision.  EARS, NOSE, AND THROAT: No tinnitus or ear pain.  RESPIRATORY: positive for cough, shortness of breath, no wheezing or hemoptysis.  CARDIOVASCULAR: No chest pain, orthopnea, edema.  GASTROINTESTINAL: No nausea, vomiting, diarrhea or abdominal pain.  GENITOURINARY: No dysuria, hematuria.  ENDOCRINE: No polyuria, nocturia,  HEMATOLOGY: No anemia, easy bruising or bleeding SKIN: No rash or lesion. MUSCULOSKELETAL: No joint pain or arthritis.   NEUROLOGIC: No tingling, numbness, weakness.  PSYCHIATRY: No anxiety or depression.   ROS  DRUG ALLERGIES:  No Known Allergies  VITALS:  Blood pressure 138/75, pulse (!) 114, temperature 97.9 F (36.6 C), temperature source Oral, resp. rate (!) 22, height 6\' 2"  (1.88 m), weight (!) 147 kg (324 lb), SpO2 94 %.  PHYSICAL EXAMINATION:  GENERAL:  41 y.o.-year-old patient lying in the bed with no acute distress.  EYES: Pupils equal, round, reactive to light and accommodation. No scleral icterus. Extraocular muscles intact.  HEENT: Head atraumatic, normocephalic. Oropharynx and nasopharynx clear.  NECK:  Supple, no jugular venous distention. No thyroid enlargement, no tenderness.  LUNGS: Normal breath sounds bilaterally, no wheezing, have b/l crepitation. No use of accessory muscles of respiration.  CARDIOVASCULAR: S1, S2 normal. No murmurs, rubs, or gallops.  ABDOMEN: Soft, nontender, nondistended. Bowel sounds present. No organomegaly or mass.   EXTREMITIES: No pedal edema, cyanosis, or clubbing.  NEUROLOGIC: Cranial nerves II through XII are intact. Muscle strength 5/5 in all extremities. Sensation intact. Gait not checked.  PSYCHIATRIC: The patient is alert and oriented x 3.  SKIN: No obvious rash, lesion, or ulcer.   Physical Exam LABORATORY PANEL:   CBC  Recent Labs Lab 11/03/16 0534  WBC 6.0  HGB 10.8*  HCT 33.6*  PLT 140*   ------------------------------------------------------------------------------------------------------------------  Chemistries   Recent Labs Lab 11/01/16 1733  11/03/16 0534  NA 137  < > 138  K 3.3*  < > 3.6  CL 99*  < > 103  CO2 28  < > 28  GLUCOSE 93  < > 107*  BUN 21*  < > 12  CREATININE 2.83*  < > 1.21  CALCIUM 8.3*  < > 8.1*  AST 29  --   --   ALT 16*  --   --   ALKPHOS 32*  --   --   BILITOT 0.9  --   --   < > = values in this interval not displayed. ------------------------------------------------------------------------------------------------------------------  Cardiac Enzymes No results for input(s): TROPONINI in the last 168 hours. ------------------------------------------------------------------------------------------------------------------  RADIOLOGY:  Dg Chest Port 1 View  Result Date: 11/03/2016 CLINICAL DATA:  41 year old male with a history of productive cough, former smoking, diabetes EXAM: PORTABLE CHEST 1 VIEW COMPARISON:  01/12/2016 FINDINGS: Cardiomediastinal silhouette unchanged in size and contour. Fullness in the central vasculature, similar to prior studies. Low lung volumes, accentuating the interstitium. Ill-defined airspace opacity in the right infrahilar region. No pneumothorax or pleural effusion. IMPRESSION: Questionable airspace opacity in the right infrahilar region, potentially representing infection. Electronically  Signed   By: Corrie Mckusick D.O.   On: 11/03/2016 11:09    ASSESSMENT AND PLAN:   Principal Problem:   Sepsis ([place]) Active  Problems:   Dyslipidemia associated with type 2 diabetes mellitus ([place])   Diabetes mellitus due to underlying condition without complication, without long-term current use of insulin ([name])   Essential hypertension   Hypothyroidism   OSA (obstructive sleep apnea)   Somnolence   AKI (acute kidney injury) ([place])  * Sepsis ([place]) -  pneumonia,   Broad spectrum antibiotics, his lactic acid was within normal limits, blood pressure was soft though, IV fluids are helping on that front, cultures sent from the ED    Echo is ordered.   Try to get sputum cx. Repeat Xray showed developing pneumonia. *  Somnolence -unclear etiology, possibly due to sepsis as above, although possibly due to lithium toxicity given his acute kidney injury, lithium level slightly high, valproic acid level was low. *  AKI (acute kidney injury) ([place]) -dehydration,  hydrate with fluids as above, and avoid nephrotoxins.  * Ac hypoxic respi failure   Due to Pneumonia, may have some fluid overload as received IV on admission, Lasix one dose. *  Diabetes mellitus due to underlying condition without complication, without long-term current use of insulin ([name]) -sliding scale insulin with corresponding glucose checks   HBA1C is not high. *  Essential hypertension -hold antihypertensives for now as the patient's blood pressure is low *  OSA (obstructive sleep apnea) -BiPAP nightly *  Hypothyroidism -home dose thyroid replacement   All the records are reviewed and case discussed with Care Management/Social Workerr. Management plans discussed with the patient, family and they are in agreement.  CODE STATUS: Full.  TOTAL TIME TAKING CARE OF THIS PATIENT: 35 minutes.     POSSIBLE D/C IN 1-2 DAYS, DEPENDING ON CLINICAL CONDITION.   Vaughan Basta M.D on 11/04/2016   Between 7am to 6pm - Pager - [phone]  After 6pm go to [email or web address] - password [email or web address] [facility]  Office   [phone]  CC: Primary care physician; Cletis Athens, MD  Note: This dictation was prepared with Dragon dictation along with smaller phrase technology. Any transcriptional errors that result from this process are unintentional.

## 2016-11-05 ENCOUNTER — Inpatient Hospital Stay: Payer: 59

## 2016-11-05 ENCOUNTER — Other Ambulatory Visit: Payer: Self-pay

## 2016-11-05 LAB — BLOOD GAS, ARTERIAL
Acid-Base Excess: 10.1 mmol/L — ABNORMAL HIGH (ref 0.0–2.0)
BICARBONATE: 38.1 mmol/L — AB (ref 20.0–28.0)
FIO2: 0.28
O2 Saturation: 93.2 %
PATIENT TEMPERATURE: 37
PCO2 ART: 69 mmHg — AB (ref 32.0–48.0)
PO2 ART: 71 mmHg — AB (ref 83.0–108.0)
pH, Arterial: 7.35 (ref 7.350–7.450)

## 2016-11-05 LAB — GLUCOSE, CAPILLARY
GLUCOSE-CAPILLARY: 105 mg/dL — AB (ref 65–99)
GLUCOSE-CAPILLARY: 105 mg/dL — AB (ref 65–99)
Glucose-Capillary: 118 mg/dL — ABNORMAL HIGH (ref 65–99)
Glucose-Capillary: 118 mg/dL — ABNORMAL HIGH (ref 65–99)
Glucose-Capillary: 113 mg/dL — ABNORMAL HIGH (ref 65–99)

## 2016-11-05 LAB — PROCALCITONIN

## 2016-11-05 LAB — LITHIUM LEVEL: LITHIUM LVL: 0.93 mmol/L (ref 0.60–1.20)

## 2016-11-05 MED ORDER — VANCOMYCIN HCL IN DEXTROSE 1-5 GM/200ML-% IV SOLN
1000.0000 mg | Freq: Once | INTRAVENOUS | Status: AC
  Administered 2016-11-05: 19:00:00 1000 mg via INTRAVENOUS
  Filled 2016-11-05: qty 200

## 2016-11-05 MED ORDER — VANCOMYCIN HCL 10 G IV SOLR
1500.0000 mg | Freq: Three times a day (TID) | INTRAVENOUS | Status: DC
  Administered 2016-11-05 – 2016-11-06 (×2): 1500 mg via INTRAVENOUS
  Filled 2016-11-05 (×5): qty 1500

## 2016-11-05 MED ORDER — VANCOMYCIN HCL 10 G IV SOLR
1750.0000 mg | Freq: Three times a day (TID) | INTRAVENOUS | Status: DC
  Filled 2016-11-05 (×3): qty 1750

## 2016-11-05 NOTE — Progress Notes (Signed)
Patient lethargic, drooling on self, audible wheezes.  When stimulated patient wakes and answers questions appropriately.  Dr. Posey Pronto notified and order obtained for ABG.

## 2016-11-05 NOTE — Progress Notes (Signed)
[facility] Progress Note Patient Name: John [name]. DOB: August 18, 1975 MRN: [id number]   Date of Service  11/05/2016  HPI/Events of Note  41 yo male admitted with acute encephalopathy of unknown etiology, acute renal failure, and possible pneumonia.  He was transferred to the stepdown unit 11/5 due to worsening encephalopathy likely secondary to hypercapnia requiring Bipap. VSS. Seen in consultation by PCCM.     eICU Interventions  No new orders.      Intervention Category Evaluation Type: New Patient Evaluation  Lysle Dingwall 11/05/2016, 9:01 PM

## 2016-11-05 NOTE — Progress Notes (Signed)
Pharmacy Antibiotic Note  John [name]. is a 41 y.o. male admitted on 11/01/2016 with pneumonia and sepsis.  Pharmacy has been consulted for vancomycin dosing.  Plan: Vancomycin 1500mg  IV every 8 hours.  Goal trough 15-20 mcg/mL.  Ke: 0.112 Estimated Creatinine Clearance: 169.9 mL/min (by C-G formula based on SCr of 0.89 mg/dL).   Had recent kidney injury, 11/1 Scr 3.86 prompting vancomycin 1500mg  iv q18h at that time.   Will get vancomycin trough before 4th dose tomorrow evening, 11/6@1930    Antibiotics: Anti-infectives (From admission, onward)   Start     Dose/Rate Route Frequency Ordered Stop   11/05/16 2000  vancomycin (VANCOCIN) 1,750 mg in sodium chloride 0.9 % 500 mL IVPB  Status:  Discontinued     1,750 mg 250 mL/hr over 120 Minutes Intravenous Every 8 hours 11/05/16 1906 11/05/16 1907   11/05/16 2000  vancomycin (VANCOCIN) 1,500 mg in sodium chloride 0.9 % 500 mL IVPB     1,500 mg 250 mL/hr over 120 Minutes Intravenous Every 8 hours 11/05/16 1907     11/05/16 1815  vancomycin (VANCOCIN) IVPB 1000 mg/200 mL premix     1,000 mg 200 mL/hr over 60 Minutes Intravenous  Once 11/05/16 1802     11/02/16 0700  ceFEPIme (MAXIPIME) 2 g in dextrose 5 % 50 mL IVPB     2 g 100 mL/hr over 30 Minutes Intravenous Every 12 hours 11/01/16 2144     11/02/16 0600  vancomycin (VANCOCIN) 1,500 mg in sodium chloride 0.9 % 500 mL IVPB  Status:  Discontinued     1,500 mg 250 mL/hr over 120 Minutes Intravenous Every 18 hours 11/01/16 2155 11/03/16 1335   11/01/16 2200  vancomycin (VANCOCIN) 500 mg in sodium chloride 0.9 % 100 mL IVPB     500 mg 100 mL/hr over 60 Minutes Intravenous  Once 11/01/16 2150 11/02/16 0032   11/01/16 2145  ceFEPIme (MAXIPIME) 1 g in dextrose 5 % 50 mL IVPB     1 g 100 mL/hr over 30 Minutes Intravenous  Once 11/01/16 2144 11/02/16 0005   11/01/16 1900  cefTRIAXone (ROCEPHIN) 1 g in dextrose 5 % 50 mL IVPB - Premix  Status:  Discontinued     1 g 100 mL/hr over 30  Minutes Intravenous  Once 11/01/16 1855 11/01/16 1856   11/01/16 1900  azithromycin (ZITHROMAX) 500 mg in dextrose 5 % 250 mL IVPB     500 mg 250 mL/hr over 60 Minutes Intravenous  Once 11/01/16 1855 11/01/16 2219   11/01/16 1900  ceFEPIme (MAXIPIME) 1 g in dextrose 5 % 50 mL IVPB     1 g 100 mL/hr over 30 Minutes Intravenous  Once 11/01/16 1856 11/01/16 2219   11/01/16 1900  vancomycin (VANCOCIN) IVPB 1000 mg/200 mL premix     1,000 mg 200 mL/hr over 60 Minutes Intravenous  Once 11/01/16 1856 11/01/16 2219      Dose adjustments this admission: Patient was previously on vancomycin 1500mg  iv q18h, but had significantly elevated Scr. Will need to monitor renal function closely.   Microbiology results: Results for orders placed or performed during the hospital encounter of 11/01/16  Blood culture (routine x 2)     Status: None (Preliminary result)   Collection Time: 11/01/16  7:12 PM  Result Value Ref Range Status   Specimen Description BLOOD BLOOD LEFT WRIST  Final   Special Requests   Final    BOTTLES DRAWN AEROBIC AND ANAEROBIC Blood Culture adequate volume   Culture  NO GROWTH 4 DAYS  Final   Report Status PENDING  Incomplete  Blood culture (routine x 2)     Status: None (Preliminary result)   Collection Time: 11/01/16  7:15 PM  Result Value Ref Range Status   Specimen Description BLOOD RIGHT ANTECUBITAL  Final   Special Requests   Final    BOTTLES DRAWN AEROBIC AND ANAEROBIC Blood Culture adequate volume   Culture NO GROWTH 4 DAYS  Final   Report Status PENDING  Incomplete  MRSA PCR Screening     Status: None   Collection Time: 11/01/16 11:32 PM  Result Value Ref Range Status   MRSA by PCR NEGATIVE NEGATIVE Final    Comment:        The GeneXpert MRSA Assay (FDA approved for NASAL specimens only), is one component of a comprehensive MRSA colonization surveillance program. It is not intended to diagnose MRSA infection nor to guide or monitor treatment for MRSA  infections.   Culture, expectorated sputum-assessment     Status: None   Collection Time: 11/03/16  2:46 PM  Result Value Ref Range Status   Specimen Description EXPECTORATED SPUTUM  Final   Special Requests Normal  Final   Sputum evaluation THIS SPECIMEN IS ACCEPTABLE FOR SPUTUM CULTURE  Final   Report Status 11/03/2016 FINAL  Final  Culture, respiratory (NON-Expectorated)     Status: None (Preliminary result)   Collection Time: 11/03/16  2:46 PM  Result Value Ref Range Status   Specimen Description EXPECTORATED SPUTUM  Final   Special Requests Normal Reflexed from [id number]  Final   Gram Stain   Final    MODERATE WBC PRESENT,BOTH PMN AND MONONUCLEAR FEW SQUAMOUS EPITHELIAL CELLS PRESENT FEW GRAM POSITIVE COCCI IN PAIRS IN CHAINS FEW GRAM NEGATIVE COCCOBACILLI RARE GRAM POSITIVE RODS    Culture   Final    CULTURE REINCUBATED FOR BETTER GROWTH Performed at [facility], [place] [address], [place], [place] [address]    Report Status PENDING  Incomplete     Thank you for allowing pharmacy to be a part of this patient's care.  Donna Christen Hayes Rehfeldt 11/05/2016 7:06 PM

## 2016-11-05 NOTE — Progress Notes (Signed)
[name] [place] NAME: John [name]    MR#:  [id number]  DATE OF BIRTH:  1975/02/09  SUBJECTIVE:  CHIEF COMPLAINT:   Chief Complaint  Patient presents with  . Altered Mental Status   Came with AMS, found to have Dehydration and suspected for infection.  Productive cough is improving  patient is sleepy today but arousable and answering questions appropriately. Uses CPAP at home, complaining of cough and pulling away oxygen  REVIEW OF SYSTEMS:  CONSTITUTIONAL: No fever, fatigue or weakness.  EYES: No blurred or double vision.  EARS, NOSE, AND THROAT: No tinnitus or ear pain.  RESPIRATORY: positive for cough, denies shortness of breath, no wheezing or hemoptysis.  CARDIOVASCULAR: No chest pain, orthopnea, edema.  GASTROINTESTINAL: No nausea, vomiting, diarrhea or abdominal pain.  GENITOURINARY: No dysuria, hematuria.  ENDOCRINE: No polyuria, nocturia,  HEMATOLOGY: No anemia, easy bruising or bleeding SKIN: No rash or lesion. MUSCULOSKELETAL: No joint pain or arthritis.   NEUROLOGIC: No tingling, numbness, weakness.  PSYCHIATRY: No anxiety or depression.   ROS  DRUG ALLERGIES:  No Known Allergies  VITALS:  Blood pressure 140/83, pulse 98, temperature 99.2 F (37.3 C), temperature source Oral, resp. rate 18, height 6\' 2"  (1.88 m), weight (!) 146.1 kg (322 lb), SpO2 99 %.  PHYSICAL EXAMINATION:  GENERAL:  41 y.o.-year-old patient lying in the bed with no acute distress.  EYES: Pupils equal, round, reactive to light and accommodation. No scleral icterus. Extraocular muscles intact.  HEENT: Head atraumatic, normocephalic. Oropharynx and nasopharynx clear.  NECK:  Supple, no jugular venous distention. No thyroid enlargement, no tenderness.  LUNGS: mod breath sounds bilaterally, no wheezing, have b/l crepitation. No use of accessory muscles of respiration.  CARDIOVASCULAR: S1, S2 normal. No murmurs, rubs, or gallops.  ABDOMEN: Soft,  nontender, nondistended. Bowel sounds present. No organomegaly or mass.  EXTREMITIES: No pedal edema, cyanosis, or clubbing.  NEUROLOGIC: Cranial nerves II through XII are intact. Muscle strength 5/5 in all extremities. Sensation intact. Gait not checked.  PSYCHIATRIC: The patient is alert and oriented x 3.  SKIN: No obvious rash, lesion, or ulcer.   Physical Exam LABORATORY PANEL:   CBC Recent Labs  Lab 11/04/16 0410  WBC 5.4  HGB 11.1*  HCT 34.7*  PLT 158   ------------------------------------------------------------------------------------------------------------------  Chemistries  Recent Labs  Lab 11/01/16 1733  11/04/16 0410  NA 137   < > 139  K 3.3*   < > 3.7  CL 99*   < > 101  CO2 28   < > 31  GLUCOSE 93   < > 121*  BUN 21*   < > 6  CREATININE 2.83*   < > 0.89  CALCIUM 8.3*   < > 8.4*  AST 29  --   --   ALT 16*  --   --   ALKPHOS 32*  --   --   BILITOT 0.9  --   --    < > = values in this interval not displayed.   ------------------------------------------------------------------------------------------------------------------  Cardiac Enzymes No results for input(s): TROPONINI in the last 168 hours. ------------------------------------------------------------------------------------------------------------------  RADIOLOGY:  Dg Chest 2 View  Result Date: 11/05/2016 CLINICAL DATA:  Altered mental status, shortness of breath, productive cough, hyper somnolence. Patient has been found to be dehydrated. History of asthma, diabetes, and morbid obesity. EXAM: CHEST  2 VIEW COMPARISON:  Chest x-ray of November 03, 2016 FINDINGS: The lungs are borderline hypoinflated. There remain coarse lung markings  in the right infrahilar region. There are small bilateral pleural effusions layering posteriorly. The cardiac silhouette is enlarged. The pulmonary vascularity is not clearly engorged. The mediastinum is normal in width. The observed bony thorax is unremarkable.  IMPRESSION: Small bilateral pleural effusions. Persistent coarse infrahilar lung markings on the right may reflect atelectasis or pneumonia. Electronically Signed   By: David  Martinique M.D.   On: 11/05/2016 15:48    ASSESSMENT AND PLAN:   Principal Problem:   Sepsis ([place]) Active Problems:   Dyslipidemia associated with type 2 diabetes mellitus ([place])   Diabetes mellitus due to underlying condition without complication, without long-term current use of insulin ([place])   Essential hypertension   Hypothyroidism   OSA (obstructive sleep apnea)   Somnolence   AKI (acute kidney injury) ([place])  * Sepsis ([place]) -  Pneumonia with parapneumonic effusion Clinically improving, but still lethargic, arousable to verbal commands Sputum culture is growing bacteria, await final results  Broad spectrum antibiotics his lactic acid was within normal limits, blood pressure improved with IV fluids    Echo -ejection fraction 75%, grade 1 diastolic dysfunction, mild mitral regurgitation and tricuspid regurgitation   Repeat Xray showed pneumonia versus atelectasis with pleural effusion  *  Somnolence -could be from sepsis and obstructive sleep apnea , possibly due to sepsis as above, although possibly due to lithium toxicity given his acute kidney injury, lithium level slightly high, at 1.29 high normal range is 1.20.  Will repeat lithium levels in a.m. valproic acid level was low.  *  AKI (acute kidney injury) ([place]) -dehydration Resolved, creatinine at 0.89  hydrated with fluids as above, and avoid nephrotoxins.   * Acute hypoxic respi failure secondary to pneumonia  Patient has received 1 dose of IV Lasix for fluid overload Weaning of oxygen from 4 L to 2 L, wean off as tolerated  *  Diabetes mellitus due to underlying condition without complication, without long-term current use of insulin ([name]) -sliding scale insulin with corresponding glucose checks   HBA1C is 5.7  *  Essential hypertension -blood  pressure is better restart atenolol at 50 mg, home dose was at 100 mg holding Cozaar  *  OSA (obstructive sleep apnea) -BiPAP nightly  *  Hypothyroidism -home dose thyroid replacement  Out of bed and ambulate the patient Physical therapy consult placed   All the records are reviewed and case discussed with Care Management/Social Workerr. Management plans discussed with the patient,he is in agreement.  CODE STATUS: Full.  TOTAL TIME TAKING CARE OF THIS PATIENT: 35 minutes.     POSSIBLE D/C IN 1 DAYS, DEPENDING ON CLINICAL CONDITION.   Nicholes Mango M.D on 11/05/2016   Between 7am to 6pm - Pager - [phone]  After 6pm go to [email or web address] - password [email or web address] [facility]  Office  [phone]  CC: Primary care physician; Cletis Athens, MD  Note: This dictation was prepared with Dragon dictation along with smaller phrase technology. Any transcriptional errors that result from this process are unintentional.

## 2016-11-05 NOTE — Consult Note (Signed)
Name: John [name]. MRN: [id number] DOB: 12-13-1975    ADMISSION DATE:  11/01/2016 CONSULTATION DATE: 11/05/2016  REFERRING MD : Dr. Bridgett Larsson   CHIEF COMPLAINT: Altered mental status    BRIEF PATIENT DESCRIPTION:  41 yo male admitted 11/1 with acute encephalopathy of unknown etiology, acute renal failure, and possible pneumonia.  He was transferred to the stepdown unit 11/5 due to worsening encephalopathy likely secondary to hypercapnia requiring Bipap   SIGNIFICANT EVENTS  11/1-Pt admitted to [facility] unit 11/5-Pt transferred to stepdown unit due to increased lethargy requiring Bipap   STUDIES:  CT Head 11/1>>Negative  Echo 11/2>>EF 50%  HISTORY OF PRESENT ILLNESS:   This is a 41 yo male with a PMH of Diabetes, Asthma, OSA, Personality Disorder, Paranoid Schizophrenia, Morbid Obesity, and Hypothyroidism.  He presented to [facility] ER 11/1 from a group home with altered mental status and somnolence.  Due to symptoms EMS was notified and upon their arrival the pt was noted to be hypoxic and wheezing with pinpoint pupils, therefore he was given intranasal narcan with some improvement of his mental status.  In the ER he remained very somnolent but was arousable to name and able to answer questions. He was given another dose of narcan in the ER and VBG revealed hypercapnia.  He was also hypotensive receiving 2L NS bolus and lab results revealed the pt was in acute renal failure with mild leukocytosis.  CXR revealed possible pneumonia, therefore sepsis protocol initiated.  He was subsequently admitted to the [facility] unit for further workup and treatment. He was transferred to the stepdown unit on 11/5 due to worsening encephalopathy likely secondary to hypercapnia requiring Bipap PCCM consulted.  PAST MEDICAL HISTORY :   has a past medical history of Anemia, Asthma, Diabetes mellitus, Morbid obesity ([place]), Paranoid schizophrenia ([place]), Personality disorder ([place]), Sleep apnea, and Thyroid disease.  has a past surgical history that includes Fracture surgery. Prior to Admission medications   Medication Sig Start Date End Date Taking? Authorizing Provider  atenolol (TENORMIN) 100 MG tablet Take 100 mg by mouth daily.   Yes [provider]  atorvastatin (LIPITOR) 10 MG tablet Take 10 mg by mouth daily.   Yes [provider]  clobetasol cream (TEMOVATE) 8.36 % Apply 1 application topically 2 (two) times daily as needed.    Yes [provider]  clonazePAM (KLONOPIN) 0.5 MG tablet Take 0.5 mg by mouth 2 (two) times daily.    Yes [provider]  clozapine (CLOZARIL) 200 MG tablet Take 1 tablet (200 mg total) by mouth daily. Patient taking differently: Take 300 mg by mouth daily.  11/28/15  Yes Robbie Lis, MD  divalproex (DEPAKOTE ER) 500 MG 24 hr tablet Take 2,000 mg by mouth at bedtime.    Yes [provider]  fluocinolone (SYNALAR) 0.01 % external solution Apply 1 mL topically 2 (two) times daily as needed. Apply to scalp, neck and ears.    Yes [provider]  furosemide (LASIX) 20 MG tablet Take 3 tablets (60 mg total) by mouth daily. 04/28/16  Yes Sudini, Alveta Heimlich, MD  glipiZIDE (GLUCOTROL) 5 MG tablet Take 0.5 tablets (2.5 mg total) by mouth daily before breakfast. Patient taking differently: Take 2.5 mg by mouth 2 (two) times daily before a meal.  04/28/16  Yes Sudini, Srikar, MD  latanoprost (XALATAN) 0.005 % ophthalmic solution Place 1 drop into both eyes at bedtime.   Yes [provider]  levothyroxine (SYNTHROID, LEVOTHROID) 75 MCG tablet Take 75  mcg by mouth daily.   Yes [provider]  lithium carbonate 300 MG capsule Take 300 mg by mouth 2 (two) times daily.   Yes [provider]  loratadine (CLARITIN) 10 MG tablet Take 10 mg by mouth daily.   Yes [provider]  losartan (COZAAR) 50 MG tablet Take 50 mg by mouth daily.   Yes [provider]  meloxicam (MOBIC) 7.5 MG tablet Take 7.5 mg  by mouth daily.   Yes [provider]  metFORMIN (GLUCOPHAGE) 1000 MG tablet Take 1,000 mg by mouth 2 (two) times daily with a meal.   Yes [provider]  Multiple Vitamins-Minerals (THEREMS-M) TABS Take 1 tablet by mouth daily.   Yes [provider]  omeprazole (PRILOSEC) 20 MG capsule Take 20 mg by mouth daily.   Yes [provider]  oxybutynin (DITROPAN) 5 MG tablet Take 5 mg by mouth daily.   Yes [provider]  triamcinolone cream (KENALOG) 0.1 % Apply 1 application topically 2 (two) times daily as needed.    Yes [provider]   No Known Allergies  FAMILY HISTORY:  family history includes Glaucoma in his mother. SOCIAL HISTORY:  reports that he has quit smoking. he has never used smokeless tobacco. He reports that he drinks alcohol. He reports that he does not use drugs.  REVIEW OF SYSTEMS:   Unable to assess pt lethargic on Bipap   SUBJECTIVE:  Unable to assess pt lethargic on Bipap   VITAL SIGNS: Temp:  [97.8 F (36.6 C)-99.2 F (37.3 C)] 98.9 F (37.2 C) (11/05 1948) Pulse Rate:  [94-124] 94 (11/05 1948) Resp:  [18-23] 18 (11/05 1948) BP: (131-154)/(82-89) 131/85 (11/05 1948) SpO2:  [76 %-99 %] 98 % (11/05 1948) Weight:  [146.1 kg (322 lb)] 146.1 kg (322 lb) (11/05 0500)  PHYSICAL EXAMINATION: General: well developed, well nourished male, NAD  Neuro: lethargic, follows commands, PERRL HEENT: supple, no JVD Cardiovascular: nsr, s1s2, no M/R/G Lungs: faint expiratory wheezes throughout, even, non labored on Bipap Abdomen: +BS x4, obese, soft, non tender, non distended  Musculoskeletal: normal bulk and tone, no edema  Skin: intact no rashes or lesions   Recent Labs  Lab 11/02/16 0441 11/03/16 0534 11/04/16 0410  NA 137 138 139  K 3.6 3.6 3.7  CL 104 103 101  CO2 28 28 31   BUN 22* 12 6  CREATININE 1.97* 1.21 0.89  GLUCOSE 103* 107* 121*   Recent Labs  Lab 11/02/16 0441 11/03/16 0534 11/04/16 0410    HGB 11.2* 10.8* 11.1*  HCT 34.7* 33.6* 34.7*  WBC 7.8 6.0 5.4  PLT 133* 140* 158   Dg Chest 2 View  Result Date: 11/05/2016 CLINICAL DATA:  Altered mental status, shortness of breath, productive cough, hyper somnolence. Patient has been found to be dehydrated. History of asthma, diabetes, and morbid obesity. EXAM: CHEST  2 VIEW COMPARISON:  Chest x-ray of November 03, 2016 FINDINGS: The lungs are borderline hypoinflated. There remain coarse lung markings in the right infrahilar region. There are small bilateral pleural effusions layering posteriorly. The cardiac silhouette is enlarged. The pulmonary vascularity is not clearly engorged. The mediastinum is normal in width. The observed bony thorax is unremarkable. IMPRESSION: Small bilateral pleural effusions. Persistent coarse infrahilar lung markings on the right may reflect atelectasis or pneumonia. Electronically Signed   By: David  Martinique M.D.   On: 11/05/2016 15:48    ASSESSMENT / PLAN: Acute encephalopathy secondary to hypercapnia  Possible sepsis secondary to questionable  pneumonia  Acute renal failure-resolved Hx: OSA, Asthma, Personality Disorder, Paranoid Schizophrenia, and Diabetes Mellitus P: Continuous Bipap for now wean as tolerated will need Bipap qhs  Maintain O2 sats >92% Repeat CXR in am  Continue bronchodilator therapy  Trend WBC and monitor fever curve Trend PCT Follow cultures Continue abx for now Trend BMP Replace electrolytes as indicated  Monitor UOP Continuous telemetry monitoring  Continue psych medications SSI and CBG's ac/hs  Marda Stalker, [place] Pager [phone] (please enter 7 digits) PCCM Consult Pager [phone] (please enter 7 digits)

## 2016-11-05 NOTE — Progress Notes (Signed)
Patient hypoxic while awake and is needing 2L nasal cannula to keep 02 SATs within normal range.  CXR preformed this shift.  Dr. Mar Daring notified of CXR results, patient hypoxia, and that he is taking oxygen off, and of lethargy.  Patient verbalized he is taking oxygen off because is up and awake, but when staff enters room patient is sleeping.  Order obtained to hold lithium and have lithium levels drawn and to place patient on continuous pulse oxymetry to monitor 02 SATs more closely.

## 2016-11-05 NOTE — Progress Notes (Signed)
  The patient is somnolent and found hypercapnia with PCO2 69. Acute respiratory failure with hypercapnia. Patient is put on BiPAP and transferred to ICU.  Discussed with the evening intensivist.

## 2016-11-05 NOTE — Progress Notes (Signed)
Patient transferred to CCU at 2020, report given. Patient mother called and informed that patient was transferred.

## 2016-11-05 NOTE — Progress Notes (Signed)
ABG results given to Dr. Bridgett Larsson.  Orders obtained to transfer patient for continuous BiPAP secondary to hypercapnia.

## 2016-11-06 ENCOUNTER — Inpatient Hospital Stay: Payer: 59

## 2016-11-06 DIAGNOSIS — G9340 Encephalopathy, unspecified: Secondary | ICD-10-CM

## 2016-11-06 DIAGNOSIS — A419 Sepsis, unspecified organism: Principal | ICD-10-CM

## 2016-11-06 DIAGNOSIS — J9601 Acute respiratory failure with hypoxia: Secondary | ICD-10-CM

## 2016-11-06 LAB — BASIC METABOLIC PANEL
ANION GAP: 7 (ref 5–15)
BUN: 9 mg/dL (ref 6–20)
CALCIUM: 8.7 mg/dL — AB (ref 8.9–10.3)
CO2: 31 mmol/L (ref 22–32)
CREATININE: 0.9 mg/dL (ref 0.61–1.24)
Chloride: 101 mmol/L (ref 101–111)
GFR calc non Af Amer: >60 mL/min (ref >60)
Glucose, Bld: 112 mg/dL — ABNORMAL HIGH (ref 65–99)
Potassium: 3.6 mmol/L (ref 3.5–5.1)
SODIUM: 139 mmol/L (ref 135–145)

## 2016-11-06 LAB — BLOOD GAS, ARTERIAL
ACID-BASE EXCESS: 10.1 mmol/L — AB (ref 0.0–2.0)
BICARBONATE: 38.3 mmol/L — AB (ref 20.0–28.0)
Delivery systems: POSITIVE
Expiratory PAP: 6
FIO2: 30
Inspiratory PAP: 14
O2 SAT: 93.5 %
PH ART: 7.34 — AB (ref 7.350–7.450)
Patient temperature: 37
pCO2 arterial: 71 mmHg (ref 32.0–48.0)
pO2, Arterial: 73 mmHg — ABNORMAL LOW (ref 83.0–108.0)

## 2016-11-06 LAB — CULTURE, BLOOD (ROUTINE X 2)
CULTURE: NO GROWTH
Culture: NO GROWTH
SPECIAL REQUESTS: ADEQUATE
Special Requests: ADEQUATE

## 2016-11-06 LAB — CBC WITH DIFFERENTIAL/PLATELET
BASOS PCT: 1 %
Basophils Absolute: 0 10*3/uL (ref 0–0.1)
EOS ABS: 0 10*3/uL (ref 0–0.7)
EOS PCT: 0 %
HCT: 33.4 % — ABNORMAL LOW (ref 40.0–52.0)
HEMOGLOBIN: 10.8 g/dL — AB (ref 13.0–18.0)
Lymphocytes Relative: 17 %
Lymphs Abs: 1 10*3/uL (ref 1.0–3.6)
MCH: 27.2 pg (ref 26.0–34.0)
MCHC: 32.4 g/dL (ref 32.0–36.0)
MCV: 83.8 fL (ref 80.0–100.0)
MONOS PCT: 14 %
Monocytes Absolute: 0.8 10*3/uL (ref 0.2–1.0)
NEUTROS PCT: 68 %
Neutro Abs: 4.1 10*3/uL (ref 1.4–6.5)
PLATELETS: 204 10*3/uL (ref 150–440)
RBC: 3.98 MIL/uL — AB (ref 4.40–5.90)
RDW: 14.5 % (ref 11.5–14.5)
WBC: 6 10*3/uL (ref 3.8–10.6)

## 2016-11-06 LAB — CULTURE, RESPIRATORY: CULTURE: NORMAL

## 2016-11-06 LAB — GLUCOSE, CAPILLARY
GLUCOSE-CAPILLARY: 100 mg/dL — AB (ref 65–99)
GLUCOSE-CAPILLARY: 91 mg/dL (ref 65–99)
GLUCOSE-CAPILLARY: 120 mg/dL — AB (ref 65–99)
Glucose-Capillary: 108 mg/dL — ABNORMAL HIGH (ref 65–99)

## 2016-11-06 LAB — CULTURE, RESPIRATORY W GRAM STAIN: Special Requests: NORMAL

## 2016-11-06 LAB — VANCOMYCIN, TROUGH: VANCOMYCIN TR: 11 ug/mL — AB (ref 15–20)

## 2016-11-06 MED ORDER — LITHIUM CARBONATE ER 450 MG PO TBCR
450.0000 mg | EXTENDED_RELEASE_TABLET | Freq: Every day | ORAL | Status: DC
  Administered 2016-11-06: 450 mg via ORAL
  Filled 2016-11-06 (×2): qty 1

## 2016-11-06 NOTE — Plan of Care (Signed)
Pt is more awake and alert. Pt now able to answer questions appropriately. Pt has strong non productive cough. Pt has been afebrile on my shift. Pt now sinus tach and able to order supper. Pt is pleasant. Pt has remained free from injury on my shift.

## 2016-11-06 NOTE — Consult Note (Signed)
[facility] Consult   Reason for Consult: Consult for 41 year old man with a history of schizophrenia currently in the intensive care unit with acute respiratory distress Referring Physician:  Gouru Patient Identification: John [name]. MRN:  [id number] Principal Diagnosis: Paranoid schizophrenia, chronic condition ([place]) Diagnosis:   Patient Active Problem List   Diagnosis Date Noted  . Encephalopathy acute [G93.40]   . Somnolence [R40.0] 11/01/2016  . AKI (acute kidney injury) ([place]) [N17.9] 11/01/2016  . Sepsis ([place]) [A41.9] 04/27/2016  . Respiratory failure ([place]) [J96.90] 12/20/2015  . Cardiomyopathy ([place]) [I42.9] 11/28/2015  . Tobacco abuse [Z72.0] 11/28/2015  . Acute respiratory failure with hypercapnia ([name]) [J96.02]   . OSA (obstructive sleep apnea) [G47.33]   . Essential hypertension [I10] 11/25/2015  . Hypothyroidism [E03.9] 11/25/2015  . Paranoid schizophrenia, chronic condition ([place]) [F20.0] 11/25/2015  . Acute respiratory failure ([place]) [J96.00]   . Acute exacerbation of COPD with asthma ([place]) [J44.1, J45.901]   . Dyslipidemia associated with type 2 diabetes mellitus ([place]) [E11.69, E78.5]   . Diabetes mellitus due to underlying condition without complication, without long-term current use of insulin ([name]) [E08.9]   . Morbid obesity ([place]) [E66.01] 09/29/2008    Total Time spent with patient: 1 hour  Subjective:   John [name]. is a 41 y.o. male patient admitted with "I had a pneumonia".  HPI: Patient interviewed chart reviewed.  Patient was arousable and able to have some conversation although he did fall back to sleep after a short period of time and a lot of his answers were contradictory or hard to follow.  Patient has a hard time answering questions about psychiatric condition but does say that he was feeling sick to his stomach tired and short of breath which is why he is in the hospital.  He denies being depressed.  Denies suicidal thoughts.  I  asked him if he was having hallucinations or hearing voices.  He responded affirmatively but when I asked him to give me more detail he started talking about his abdominal symptoms again.  Chart reviewed.  Patient's lithium level was elevated at 1.29 when he first came in and his creatinine was elevated.  Since then lithium level has come down to just below 1 and his kidney function has improved.  Patient denies any acute suicidality and there is no evidence that he has had significant behavior problems since coming into the emergency room.  Consult was for a medication evaluation and adjustment.  Social history: Patient lives in a group home.  He has been there for years.  Has no complaint about it.  Not clear whether he has a guardian.  Medical history: Multiple medical problems.  Possibly history of heart failure.  He has had multiple visits to the hospital for acute respiratory failure.  Some of these pneumonia.  Some of these less clear.  Usually clears up in a short time.  History of asthma history of diabetes or thyroid disease  Substance abuse history: Denies any recent alcohol or drug abuse no history of past substance abuse issues  Past Psychiatric History: Patient has a long history of schizophrenia and is maintained on clozapine Depakote and lithium and Klonopin.  These medicines have been stable for what looks like several years.  There have only been minor ups and downs in the dosage.  Current lithium dose of 300 mg twice a day is slightly higher than the 450 mg at night that he was taking last time I encountered him.  No history of suicidal behavior or aggression.  Appears to have probably had hospitalizations in the distant past but would been mostly mentally stable in recent years.  Risk to Self: Is patient at risk for suicide?: No Risk to Others:   Prior Inpatient Therapy:   Prior Outpatient Therapy:    Past Medical History:  Past Medical History:  Diagnosis Date  . Anemia   .  Asthma   . Diabetes mellitus   . Morbid obesity ([place])   . Paranoid schizophrenia ([place])   . Personality disorder ([place])   . Sleep apnea   . Thyroid disease     Past Surgical History:  Procedure Laterality Date  . FRACTURE SURGERY     Family History:  Family History  Problem Relation Age of Onset  . Glaucoma Mother    Family Psychiatric  History: None known Social History:  Social History   Substance and Sexual Activity  Alcohol Use Yes   Comment: only once in a while     Social History   Substance and Sexual Activity  Drug Use No    Social History   Socioeconomic History  . Marital status: Single    Spouse name: None  . Number of children: None  . Years of education: None  . Highest education level: None  Social Needs  . Financial resource strain: None  . Food insecurity - worry: None  . Food insecurity - inability: None  . Transportation needs - medical: None  . Transportation needs - non-medical: None  Occupational History  . None  Tobacco Use  . Smoking status: Former Research scientist (life sciences)  . Smokeless tobacco: Never Used  Substance and Sexual Activity  . Alcohol use: Yes    Comment: only once in a while  . Drug use: No  . Sexual activity: None  Other Topics Concern  . None  Social History Narrative  . None   Additional Social History:    Allergies:  No Known Allergies  Labs:  Results for orders placed or performed during the hospital encounter of 11/01/16 (from the past 48 hour(s))  Glucose, capillary     Status: Abnormal   Collection Time: 11/04/16  9:48 PM  Result Value Ref Range   Glucose-Capillary 121 (H) 65 - 99 mg/dL   Comment 1 Notify RN   Procalcitonin     Status: None   Collection Time: 11/05/16  4:37 AM  Result Value Ref Range   Procalcitonin <0.10 ng/mL    Comment:        Interpretation: PCT (Procalcitonin) <= 0.5 ng/mL: Systemic infection (sepsis) is not likely. Local bacterial infection is possible. (NOTE)         ICU PCT Algorithm                Non ICU PCT Algorithm    ----------------------------     ------------------------------         PCT < 0.25 ng/mL                 PCT < 0.1 ng/mL     Stopping of antibiotics            Stopping of antibiotics       strongly encouraged.               strongly encouraged.    ----------------------------     ------------------------------       PCT level decrease by  PCT < 0.25 ng/mL       >= 80% from peak PCT       OR PCT 0.25 - 0.5 ng/mL          Stopping of antibiotics                                             encouraged.     Stopping of antibiotics           encouraged.    ----------------------------     ------------------------------       PCT level decrease by              PCT >= 0.25 ng/mL       < 80% from peak PCT        AND PCT >= 0.5 ng/mL            Continuin g antibiotics                                              encouraged.       Continuing antibiotics            encouraged.    ----------------------------     ------------------------------     PCT level increase compared          PCT > 0.5 ng/mL         with peak PCT AND          PCT >= 0.5 ng/mL             Escalation of antibiotics                                          strongly encouraged.      Escalation of antibiotics        strongly encouraged.   Lithium level     Status: None   Collection Time: 11/05/16  4:37 AM  Result Value Ref Range   Lithium Lvl 0.93 0.60 - 1.20 mmol/L  Glucose, capillary     Status: Abnormal   Collection Time: 11/05/16  8:00 AM  Result Value Ref Range   Glucose-Capillary 105 (H) 65 - 99 mg/dL  Glucose, capillary     Status: Abnormal   Collection Time: 11/05/16 12:03 PM  Result Value Ref Range   Glucose-Capillary 118 (H) 65 - 99 mg/dL  Glucose, capillary     Status: Abnormal   Collection Time: 11/05/16  4:55 PM  Result Value Ref Range   Glucose-Capillary 105 (H) 65 - 99 mg/dL  Blood gas, arterial     Status: Abnormal   Collection Time: 11/05/16  6:31 PM   Result Value Ref Range   FIO2 0.28    Delivery systems NASAL CANNULA    pH, Arterial 7.35 7.350 - 7.450   pCO2 arterial 69 (HH) 32.0 - 48.0 mmHg    Comment: CRITICAL RESULT CALLED TO, READ BACK BY AND VERIFIED WITH: DR. PATEL 11/05/16 1850 EE    pO2, Arterial 71 (L) 83.0 - 108.0 mmHg   Bicarbonate 38.1 (H) 20.0 - 28.0 mmol/L   Acid-Base Excess 10.1 (H) 0.0 - 2.0 mmol/L  O2 Saturation 93.2 %   Patient temperature 37.0    Collection site RIGHT RADIAL    Sample type ARTERIAL DRAW    Allens test (pass/fail) PASS PASS  Glucose, capillary     Status: Abnormal   Collection Time: 11/05/16  8:13 PM  Result Value Ref Range   Glucose-Capillary 118 (H) 65 - 99 mg/dL  Glucose, capillary     Status: Abnormal   Collection Time: 11/05/16  9:52 PM  Result Value Ref Range   Glucose-Capillary 113 (H) 65 - 99 mg/dL  Basic metabolic panel     Status: Abnormal   Collection Time: 11/06/16  6:34 AM  Result Value Ref Range   Sodium 139 135 - 145 mmol/L   Potassium 3.6 3.5 - 5.1 mmol/L   Chloride 101 101 - 111 mmol/L   CO2 31 22 - 32 mmol/L   Glucose, Bld 112 (H) 65 - 99 mg/dL   BUN 9 6 - 20 mg/dL   Creatinine, Ser 0.90 0.61 - 1.24 mg/dL   Calcium 8.7 (L) 8.9 - 10.3 mg/dL   GFR calc non Af Amer >60 >60 mL/min   GFR calc Af Amer >60 >60 mL/min    Comment: (NOTE) The eGFR has been calculated using the CKD EPI equation. This calculation has not been validated in all clinical situations. eGFR's persistently <60 mL/min signify possible Chronic Kidney Disease.    Anion gap 7 5 - 15  CBC with Differential/Platelet     Status: Abnormal   Collection Time: 11/06/16  7:41 AM  Result Value Ref Range   WBC 6.0 3.8 - 10.6 K/uL   RBC 3.98 (L) 4.40 - 5.90 MIL/uL   Hemoglobin 10.8 (L) 13.0 - 18.0 g/dL   HCT 33.4 (L) 40.0 - 52.0 %   MCV 83.8 80.0 - 100.0 fL   MCH 27.2 26.0 - 34.0 pg   MCHC 32.4 32.0 - 36.0 g/dL   RDW 14.5 11.5 - 14.5 %   Platelets 204 150 - 440 K/uL   Neutrophils Relative % 68 %    Neutro Abs 4.1 1.4 - 6.5 K/uL   Lymphocytes Relative 17 %   Lymphs Abs 1.0 1.0 - 3.6 K/uL   Monocytes Relative 14 %   Monocytes Absolute 0.8 0.2 - 1.0 K/uL   Eosinophils Relative 0 %   Eosinophils Absolute 0.0 0 - 0.7 K/uL   Basophils Relative 1 %   Basophils Absolute 0.0 0 - 0.1 K/uL  Glucose, capillary     Status: Abnormal   Collection Time: 11/06/16  7:54 AM  Result Value Ref Range   Glucose-Capillary 108 (H) 65 - 99 mg/dL  Blood gas, arterial     Status: Abnormal   Collection Time: 11/06/16  9:00 AM  Result Value Ref Range   FIO2 30.00    Delivery systems BILEVEL POSITIVE AIRWAY PRESSURE    Inspiratory PAP 14    Expiratory PAP 6    pH, Arterial 7.34 (L) 7.350 - 7.450   pCO2 arterial 71 (HH) 32.0 - 48.0 mmHg    Comment: CRITICAL RESULT CALLED TO, READ BACK BY AND VERIFIED WITH: DR KASA [id number] JGT    pO2, Arterial 73 (L) 83.0 - 108.0 mmHg   Bicarbonate 38.3 (H) 20.0 - 28.0 mmol/L   Acid-Base Excess 10.1 (H) 0.0 - 2.0 mmol/L   O2 Saturation 93.5 %   Patient temperature 37.0    Collection site RIGHT RADIAL    Sample type ARTERIAL DRAW    Allens test (pass/fail)  PASS PASS  Glucose, capillary     Status: Abnormal   Collection Time: 11/06/16 11:46 AM  Result Value Ref Range   Glucose-Capillary 100 (H) 65 - 99 mg/dL  Glucose, capillary     Status: None   Collection Time: 11/06/16  6:09 PM  Result Value Ref Range   Glucose-Capillary 91 65 - 99 mg/dL    Current Facility-Administered Medications  Medication Dose Route Frequency Provider Last Rate Last Dose  . acetaminophen (TYLENOL) tablet 650 mg  650 mg Oral Q6H PRN Lance Coon, MD   650 mg at 11/02/16 2316   Or  . acetaminophen (TYLENOL) suppository 650 mg  650 mg Rectal Q6H PRN Lance Coon, MD      . atenolol (TENORMIN) tablet 50 mg  50 mg Oral Daily Gouru, Aruna, MD   50 mg at 11/05/16 0906  . atorvastatin (LIPITOR) tablet 10 mg  10 mg Oral Daily Lance Coon, MD   10 mg at 11/05/16 0906  . cloZAPine (CLOZARIL)  tablet 300 mg  300 mg Oral Daily Vaughan Basta, MD   300 mg at 11/05/16 0906  . divalproex (DEPAKOTE ER) 24 hr tablet 2,000 mg  2,000 mg Oral QHS Vaughan Basta, MD   2,000 mg at 11/04/16 2224  . guaiFENesin-dextromethorphan (ROBITUSSIN DM) 100-10 MG/5ML syrup 5 mL  5 mL Oral Q4H PRN Vaughan Basta, MD   5 mL at 11/06/16 0105  . heparin injection 5,000 Units  5,000 Units Subcutaneous Camelia Phenes Lance Coon, MD   5,000 Units at 11/06/16 1422  . insulin aspart (novoLOG) injection 0-5 Units  0-5 Units Subcutaneous QHS Vaughan Basta, MD      . insulin aspart (novoLOG) injection 0-9 Units  0-9 Units Subcutaneous TID WC Vaughan Basta, MD   1 Units at 11/04/16 1254  . ipratropium-albuterol (DUONEB) 0.5-2.5 (3) MG/3ML nebulizer solution 3 mL  3 mL Nebulization Q4H Vaughan Basta, MD   3 mL at 11/06/16 1501  . latanoprost (XALATAN) 0.005 % ophthalmic solution 1 drop  1 drop Both Eyes QHS Lance Coon, MD   1 drop at 11/05/16 2240  . levothyroxine (SYNTHROID, LEVOTHROID) tablet 75 mcg  75 mcg Oral QAC breakfast Lance Coon, MD   75 mcg at 11/05/16 0906  . lithium carbonate (ESKALITH) CR tablet 450 mg  450 mg Oral QHS Shambria Camerer T, MD      . MEDLINE mouth rinse  15 mL Mouth Rinse BID Lance Coon, MD   15 mL at 11/05/16 2243  . ondansetron (ZOFRAN) tablet 4 mg  4 mg Oral Q6H PRN Lance Coon, MD       Or  . ondansetron [facility]) injection 4 mg  4 mg Intravenous Q6H PRN Lance Coon, MD      . pantoprazole (PROTONIX) EC tablet 40 mg  40 mg Oral Daily Lance Coon, MD   40 mg at 11/05/16 0906    Musculoskeletal: Strength & Muscle Tone: within normal limits Gait & Station: unable to stand Patient leans: N/A  Psychiatric Specialty Exam: Physical Exam  Nursing note and vitals reviewed. Constitutional: He appears well-developed and well-nourished.  HENT:  Head: Normocephalic and atraumatic.  Eyes: Conjunctivae are normal. Pupils are equal, round, and  reactive to light.  Neck: Normal range of motion.  Cardiovascular: Regular rhythm and normal heart sounds.  Respiratory: Effort normal and breath sounds normal.  GI: Soft.  Musculoskeletal: Normal range of motion.  Neurological: He is alert.  Skin: Skin is warm and dry.  Psychiatric: Judgment normal. His affect  is blunt. His speech is delayed. He is slowed. Thought content is not paranoid. Cognition and memory are impaired. He expresses no homicidal and no suicidal ideation.    Review of Systems  Constitutional: Negative.   HENT: Negative.   Eyes: Negative.   Respiratory: Positive for shortness of breath.   Cardiovascular: Negative.   Gastrointestinal: Positive for nausea.  Musculoskeletal: Negative.   Skin: Negative.   Neurological: Negative.   Psychiatric/Behavioral: Positive for hallucinations. Negative for depression, memory loss, substance abuse and suicidal ideas. The patient is not nervous/anxious and does not have insomnia.     Blood pressure (!) 132/98, pulse 99, temperature 98.4 F (36.9 C), temperature source Oral, resp. rate 17, height 6' 2"  (1.88 m), weight (!) 145.3 kg (320 lb 5.3 oz), SpO2 100 %.Body mass index is 41.13 kg/m.  General Appearance: Casual  Eye Contact:  Fair  Speech:  Slow  Volume:  Decreased  Mood:  Euthymic  Affect:  Constricted  Thought Process:  Disorganized  Orientation:  Full (Time, Place, and Person)  Thought Content:  Rumination and Tangential  Suicidal Thoughts:  No  Homicidal Thoughts:  No  Memory:  Immediate;   Fair Recent;   Poor Remote;   Fair  Judgement:  Fair  Insight:  Fair  Psychomotor Activity:  Decreased  Concentration:  Concentration: Fair  Recall:  [employer] of Knowledge:  Fair  Language:  Fair  Akathisia:  No  Handed:  Right  AIMS (if indicated):     Assets:  Desire for Improvement Housing Resilience  ADL's:  Impaired  Cognition:  Impaired,  Mild  Sleep:        Treatment Plan Summary: Medication management  and Plan 41 year old man with a history of schizophrenia.  Seems to be recovering from the condition he was in when he first came into the hospital.  Current doses of medication have been stable for quite a while except that his clozapine dose seems to be a little bit higher.  The lithium level has come down a bit.  The only adjustment I am going to make right now will be to decrease his lithium back to 450 mg of extended release at nighttime only.  Medications most likely have little effect on his condition and probably will not need longer term adjustment although I am going to make sure we check a clozapine level.  Clozapine does carry some risk of cardiomyopathy and we should probably check to see if this could be getting worse and be related to his medicine.  Patient will not need psychiatric hospitalization.  I will follow up as needed.  Disposition: Patient does not meet criteria for psychiatric inpatient admission. Supportive therapy provided about ongoing stressors.  Alethia Berthold, MD 11/06/2016 7:37 PM

## 2016-11-06 NOTE — Progress Notes (Signed)
Pt's mom called to request psych consult on patient for reevaluation. Mother has concerns that pt's lethargy could be related to recent increase in lithium dose. Was increased from 150mg  -300 mg. Pt's mother states that she drove by to pick the medication up and noticed it had been changed without her son being reevaluated. Pt's mother also mentions that pt's group home director expresses concerns regarding medication as well. This nurse will pass this information on during rounds. Will continue to assess.

## 2016-11-06 NOTE — Progress Notes (Signed)
PT Cancellation Note  Patient Details Name: John [name]. MRN: [id number] DOB: Feb 20, 1975   Cancelled Treatment:    Reason Eval/Treat Not Completed: Medical issues which prohibited therapy; Pt with a decline in status resulting in transfer to stepdown unit in ICU; will complete PT orders at this time and will assess pt upon receipt of new PT orders when medically appropriate.     Linus Salmons PT, DPT 11/06/16, 8:44 AM

## 2016-11-06 NOTE — Progress Notes (Signed)
At initital assessment pt is lethargic, extremely hard to arouse. Pt will arouse to stimulation but immediately drifts back to sleep. Pt can state name. Dr.Kasa notified and aware, orders for ABG. Will continue to assess.

## 2016-11-06 NOTE — Progress Notes (Signed)
[facility] [place] NAME: John [name]    MR#:  [id number]  DATE OF BIRTH:  1975/03/31  SUBJECTIVE:  CHIEF COMPLAINT:   Chief Complaint  Patient presents with  . Altered Mental Status   Came with AMS, found to have Dehydration and suspected for infection.  Productive cough is improving  11/5 patient is transferred to intensive care unit for BiPAP for CO2 narcosis 11/6 currently on BiPAP and lethargic  REVIEW OF SYSTEMS:   ROS unobtainable  DRUG ALLERGIES:  No Known Allergies  VITALS:  Blood pressure (!) 132/98, pulse 99, temperature 98.4 F (36.9 C), temperature source Oral, resp. rate 17, height 6\' 2"  (1.88 m), weight (!) 145.3 kg (320 lb 5.3 oz), SpO2 100 %.  PHYSICAL EXAMINATION:  GENERAL:  41 y.o.-year-old patient lying in the bed with no acute distress.  EYES: Pupils equal, round, reactive to light and accommodation. No scleral icterus.  HEENT: Head atraumatic, normocephalic. Oropharynx and nasopharynx clear.  NECK:  Supple, no jugular venous distention. No thyroid enlargement, no tenderness.  LUNGS: mod breath sounds bilaterally, no wheezing, have b/l crepitation. No use of accessory muscles of respiration.  On BiPAP CARDIOVASCULAR: S1, S2 normal. No murmurs, rubs, or gallops.  ABDOMEN: Soft, nontender, nondistended. Bowel sounds present. No organomegaly or mass.  EXTREMITIES: No pedal edema, cyanosis, or clubbing.  NEUROLOGIC: Delirious PSYCHIATRIC: The patient is disoriented  SKIN: No obvious rash, lesion, or ulcer.   Physical Exam LABORATORY PANEL:   CBC Recent Labs  Lab 11/06/16 0741  WBC 6.0  HGB 10.8*  HCT 33.4*  PLT 204   ------------------------------------------------------------------------------------------------------------------  Chemistries  Recent Labs  Lab 11/01/16 1733  11/06/16 0634  NA 137   < > 139  K 3.3*   < > 3.6  CL 99*   < > 101  CO2 28   < > 31  GLUCOSE 93   < > 112*  BUN 21*   <  > 9  CREATININE 2.83*   < > 0.90  CALCIUM 8.3*   < > 8.7*  AST 29  --   --   ALT 16*  --   --   ALKPHOS 32*  --   --   BILITOT 0.9  --   --    < > = values in this interval not displayed.   ------------------------------------------------------------------------------------------------------------------  Cardiac Enzymes No results for input(s): TROPONINI in the last 168 hours. ------------------------------------------------------------------------------------------------------------------  RADIOLOGY:  Dg Chest 2 View  Result Date: 11/05/2016 CLINICAL DATA:  Altered mental status, shortness of breath, productive cough, hyper somnolence. Patient has been found to be dehydrated. History of asthma, diabetes, and morbid obesity. EXAM: CHEST  2 VIEW COMPARISON:  Chest x-ray of November 03, 2016 FINDINGS: The lungs are borderline hypoinflated. There remain coarse lung markings in the right infrahilar region. There are small bilateral pleural effusions layering posteriorly. The cardiac silhouette is enlarged. The pulmonary vascularity is not clearly engorged. The mediastinum is normal in width. The observed bony thorax is unremarkable. IMPRESSION: Small bilateral pleural effusions. Persistent coarse infrahilar lung markings on the right may reflect atelectasis or pneumonia. Electronically Signed   By: David  Martinique M.D.   On: 11/05/2016 15:48   Dg Chest Port 1 View  Result Date: 11/06/2016 CLINICAL DATA:  Acute respiratory failure. History of cardiomyopathy, asthma, diabetes, morbid obesity, sepsis, former smoker. EXAM: PORTABLE CHEST 1 VIEW COMPARISON:  Chest x-ray of November 5th 2018 at 2:47 p.m. FINDINGS: The lungs are  mildly hypoinflated. The lung markings are coarse in the right infrahilar region. The left lung is clear. There is no pleural effusion. The cardiac silhouette is enlarged. The pulmonary vascularity is not engorged. The observed bony thorax is unremarkable. IMPRESSION: Coarse lung  markings at the right base and in the right infrahilar region likely reflects atelectasis or early infiltrate in the right lower lobe. Allowing for differences in positioning and degree of inspiration there has likely not been significant interval change. Electronically Signed   By: David  Martinique M.D.   On: 11/06/2016 10:22    ASSESSMENT AND PLAN:   Principal Problem:   Sepsis ([place]) Active Problems:   Acute respiratory failure ([place])   Dyslipidemia associated with type 2 diabetes mellitus ([place])   Diabetes mellitus due to underlying condition without complication, without long-term current use of insulin ([place])   Essential hypertension   Hypothyroidism   OSA (obstructive sleep apnea)   Somnolence   AKI (acute kidney injury) ([place])   Encephalopathy acute  *Acute hypoxic respiratory failure with CO2 narcosis  transferred to stepdown unit for BiPAP  follow-up with intensivist Repeat ABG with pH 7.34 and PCO2 71 PO2 73  * Sepsis ([place]) -  Pneumonia with parapneumonic effusion Sputum culture is growing bacteria, await final results  Broad spectrum antibiotics his lactic acid was within normal limits, blood pressure improved with IV fluids    Echo -ejection fraction 53%, grade 1 diastolic dysfunction, mild mitral regurgitation and tricuspid regurgitation   Repeat Xray showed pneumonia versus atelectasis with pleural effusion  *  AKI (acute kidney injury) ([place]) -dehydration Resolved, creatinine at 0.89  hydrated with fluids as above, and avoid nephrotoxins.    *  Diabetes mellitus due to underlying condition without complication, without long-term current use of insulin ([name]) -sliding scale insulin with corresponding glucose checks   HBA1C is 5.7  *  Essential hypertension -blood pressure is better restart atenolol at 50 mg, home dose was at 100 mg holding Cozaar  *  OSA (obstructive sleep apnea) -BiPAP nightly  *  Hypothyroidism -home dose thyroid replacement   Physical therapy  consult pending   All the records are reviewed and case discussed with Care Management/Social Workerr. Management plans discussed with the RN and intensivist  CODE STATUS: Full.  TOTAL TIME TAKING CARE OF THIS PATIENT: 35 minutes.     POSSIBLE D/C IN 1 DAYS, DEPENDING ON CLINICAL CONDITION.   Nicholes Mango M.D on 11/06/2016   Between 7am to 6pm - Pager - [phone]  After 6pm go to [email or web address] - password [email or web address] [facility]  Office  [phone]  CC: Primary care physician; Cletis Athens, MD  Note: This dictation was prepared with Dragon dictation along with smaller phrase technology. Any transcriptional errors that result from this process are unintentional.

## 2016-11-07 DIAGNOSIS — F200 Paranoid schizophrenia: Secondary | ICD-10-CM

## 2016-11-07 LAB — GLUCOSE, CAPILLARY
Glucose-Capillary: 97 mg/dL (ref 65–99)
Glucose-Capillary: 142 mg/dL — ABNORMAL HIGH (ref 65–99)
Glucose-Capillary: 115 mg/dL — ABNORMAL HIGH (ref 65–99)
Glucose-Capillary: 121 mg/dL — ABNORMAL HIGH (ref 65–99)

## 2016-11-07 MED ORDER — IPRATROPIUM-ALBUTEROL 0.5-2.5 (3) MG/3ML IN SOLN
3.0000 mL | Freq: Four times a day (QID) | RESPIRATORY_TRACT | Status: DC
  Administered 2016-11-07 – 2016-11-14 (×28): 3 mL via RESPIRATORY_TRACT
  Filled 2016-11-07 (×28): qty 3

## 2016-11-07 MED ORDER — IBUPROFEN 400 MG PO TABS
600.0000 mg | ORAL_TABLET | Freq: Four times a day (QID) | ORAL | Status: DC | PRN
  Filled 2016-11-07: qty 2

## 2016-11-07 MED ORDER — GABAPENTIN 300 MG PO CAPS
300.0000 mg | ORAL_CAPSULE | Freq: Two times a day (BID) | ORAL | Status: DC
  Administered 2016-11-08: 300 mg via ORAL
  Filled 2016-11-07 (×2): qty 1

## 2016-11-07 NOTE — Progress Notes (Signed)
This nurse entered pt's room to transfer to bedside recliner.  Pt is extremely lethargic.Will respond to pain briefly by moaning and opening eyes. Pt's HR is STACH at 106, BP 134/86 RR 25 stas 97. Dr.Kasa aware and at bedside. No new orders at this time. Will continue to assess.

## 2016-11-07 NOTE — Progress Notes (Signed)
[facility] [place] NAME: John [name]    MR#:  [id number]  DATE OF BIRTH:  02/20/1975  SUBJECTIVE:  CHIEF COMPLAINT:   Chief Complaint  Patient presents with  . Altered Mental Status   Came with AMS, found to have Dehydration and suspected for infection.  Productive cough is improving  11/5 patient is transferred to intensive care unit for BiPAP for CO2 narcosis 11/6 off  BiPAP and lethargic 11/7 off BiPAP but hallucinating, psychiatry is following patient  REVIEW OF SYSTEMS:   ROS unobtainable  DRUG ALLERGIES:  No Known Allergies  VITALS:  Blood pressure 132/90, pulse (!) 104, temperature (!) 101.1 F (38.4 C), temperature source Oral, resp. rate (!) 26, height 6\' 2"  (1.88 m), weight (!) 148 kg (326 lb 4.5 oz), SpO2 95 %.  PHYSICAL EXAMINATION:  GENERAL:  41 y.o.-year-old patient lying in the bed with no acute distress.  EYES: Pupils equal, round, reactive to light and accommodation. No scleral icterus.  HEENT: Head atraumatic, normocephalic. Oropharynx and nasopharynx clear.  NECK:  Supple, no jugular venous distention. No thyroid enlargement, no tenderness.  LUNGS: mod breath sounds bilaterally, no wheezing, have b/l crepitation. No use of accessory muscles of respiration.  On BiPAP CARDIOVASCULAR: S1, S2 normal. No murmurs, rubs, or gallops.  ABDOMEN: Soft, nontender, nondistended. Bowel sounds present. No organomegaly or mass.  EXTREMITIES: No pedal edema, cyanosis, or clubbing.  NEUROLOGIC: Delirious PSYCHIATRIC: The patient is disoriented  SKIN: No obvious rash, lesion, or ulcer.   Physical Exam LABORATORY PANEL:   CBC Recent Labs  Lab 11/06/16 0741  WBC 6.0  HGB 10.8*  HCT 33.4*  PLT 204   ------------------------------------------------------------------------------------------------------------------  Chemistries  Recent Labs  Lab 11/01/16 1733  11/06/16 0634  NA 137   < > 139  K 3.3*   < > 3.6  CL 99*    < > 101  CO2 28   < > 31  GLUCOSE 93   < > 112*  BUN 21*   < > 9  CREATININE 2.83*   < > 0.90  CALCIUM 8.3*   < > 8.7*  AST 29  --   --   ALT 16*  --   --   ALKPHOS 32*  --   --   BILITOT 0.9  --   --    < > = values in this interval not displayed.   ------------------------------------------------------------------------------------------------------------------  Cardiac Enzymes No results for input(s): TROPONINI in the last 168 hours. ------------------------------------------------------------------------------------------------------------------  RADIOLOGY:  Dg Chest Port 1 View  Result Date: 11/06/2016 CLINICAL DATA:  Acute respiratory failure. History of cardiomyopathy, asthma, diabetes, morbid obesity, sepsis, former smoker. EXAM: PORTABLE CHEST 1 VIEW COMPARISON:  Chest x-ray of November 5th 2018 at 2:47 p.m. FINDINGS: The lungs are mildly hypoinflated. The lung markings are coarse in the right infrahilar region. The left lung is clear. There is no pleural effusion. The cardiac silhouette is enlarged. The pulmonary vascularity is not engorged. The observed bony thorax is unremarkable. IMPRESSION: Coarse lung markings at the right base and in the right infrahilar region likely reflects atelectasis or early infiltrate in the right lower lobe. Allowing for differences in positioning and degree of inspiration there has likely not been significant interval change. Electronically Signed   By: David  Martinique M.D.   On: 11/06/2016 10:22    ASSESSMENT AND PLAN:   Principal Problem:   Paranoid schizophrenia, chronic condition ([place]) Active Problems:   Acute respiratory failure (  [place])   Dyslipidemia associated with type 2 diabetes mellitus ([place])   Diabetes mellitus due to underlying condition without complication, without long-term current use of insulin ([place])   Essential hypertension   Hypothyroidism   OSA (obstructive sleep apnea)   Sepsis ([place])   Somnolence   AKI (acute kidney  injury) ([place])   Encephalopathy acute  *Acute hypoxic respiratory failure with CO2 narcosis  transferred to stepdown unit for BiPAP 11/5, currently off BiPAP Patient is getting transferred to the floor Continue bronchodilator therapy  *Paranoid schizophrenia Psychiatry Dr. Weber Cooks is following  * Sepsis ([place]) -  Pneumonia with parapneumonic effusion Sputum culture is growing bacteria, await final results  Broad spectrum antibiotics his lactic acid was within normal limits, blood pressure improved with IV fluids    Echo -ejection fraction 84%, grade 1 diastolic dysfunction, mild mitral regurgitation and tricuspid regurgitation   Repeat Xray showed pneumonia versus atelectasis with pleural effusion  *  AKI (acute kidney injury) ([place]) -dehydration Resolved, creatinine at 0.89  hydrated with fluids as above, and avoid nephrotoxins.    *  Diabetes mellitus due to underlying condition without complication, without long-term current use of insulin ([name]) -sliding scale insulin with corresponding glucose checks   HBA1C is 5.7  *  Essential hypertension -blood pressure is better restart atenolol at 50 mg, home dose was at 100 mg holding Cozaar  *  OSA (obstructive sleep apnea) -BiPAP nightly  *  Hypothyroidism -home dose thyroid replacement   Physical therapy consult pending   All the records are reviewed and case discussed with Care Management/Social Workerr. Management plans discussed with the RN and intensivist  CODE STATUS: Full.  TOTAL TIME TAKING CARE OF THIS PATIENT: 35 minutes.     POSSIBLE D/C IN 1 DAYS, DEPENDING ON CLINICAL CONDITION.   Nicholes Mango M.D on 11/07/2016   Between 7am to 6pm - Pager - [phone]  After 6pm go to [email or web address] - password [email or web address] [facility]  Office  [phone]  CC: Primary care physician; Cletis Athens, MD  Note: This dictation was prepared with Dragon dictation along with smaller phrase technology. Any  transcriptional errors that result from this process are unintentional.

## 2016-11-07 NOTE — Consult Note (Signed)
   Name: John [name]. MRN: [id number] DOB: 01/17/1975    ADMISSION DATE:  11/01/2016 CONSULTATION DATE: 11/05/2016  REFERRING MD : Dr. Bridgett Larsson   CHIEF COMPLAINT: Altered mental status    BRIEF PATIENT DESCRIPTION:  41 yo male admitted 11/1 with acute encephalopathy of unknown etiology, acute renal failure, and possible pneumonia.  He was transferred to the stepdown unit 11/5 due to worsening encephalopathy likely secondary to hypercapnia requiring Bipap   SIGNIFICANT EVENTS  11/1-Pt admitted to [facility] unit 11/5-Pt transferred to stepdown unit due to increased lethargy requiring Bipap  11/6 off biPAP  STUDIES:  CT Head 11/1>>Negative  Echo 11/2>>EF 50%  HISTORY OF PRESENT ILLNESS:   Alert and awake Follows commands Off of biPAP   REVIEW OF SYSTEMS:   ALL other ROS negative   VITAL SIGNS: Temp:  [98.7 F (37.1 C)-99 F (37.2 C)] 99 F (37.2 C) (11/07 0739) Pulse Rate:  [94-132] 132 (11/07 1001) Resp:  [13-34] 22 (11/07 1000) BP: (115-151)/(84-103) 115/103 (11/07 1001) SpO2:  [87 %-100 %] 95 % (11/07 1000) FiO2 (%):  [30 %] 30 % (11/07 0314) Weight:  [326 lb 4.5 oz (148 kg)] 326 lb 4.5 oz (148 kg) (11/07 0117)  PHYSICAL EXAMINATION: General: well developed, well nourished male, NAD  Neuro: lethargic, follows commands, PERRL HEENT: supple, no JVD Cardiovascular: nsr, s1s2, no M/R/G Lungs: faint expiratory wheezes throughout, even, non labored on Bipap Abdomen: +BS x4, obese, soft, non tender, non distended  Musculoskeletal: normal bulk and tone, no edema  Skin: intact no rashes or lesions   Recent Labs  Lab 11/03/16 0534 11/04/16 0410 11/06/16 0634  NA 138 139 139  K 3.6 3.7 3.6  CL 103 101 101  CO2 28 31 31   BUN 12 6 9   CREATININE 1.21 0.89 0.90  GLUCOSE 107* 121* 112*   Recent Labs  Lab 11/03/16 0534 11/04/16 0410 11/06/16 0741  HGB 10.8* 11.1* 10.8*  HCT 33.6* 34.7* 33.4*  WBC 6.0 5.4 6.0  PLT 140* 158 204     ASSESSMENT / PLAN: 41 yo  morbidly obese AAM with resp distress from polysubstance abuse with multiple phyciatric drugs in setting of OHS/OSA   Acute encephalopathy secondary to hypercapnia-resolving Acute renal failure-resolved Hx: OSA, Asthma, Personality Disorder, Paranoid Schizophrenia, and Diabetes Mellitus  Maintain O2 sats >92% Continue bronchodilator therapy  Replace electrolytes as indicated  Monitor UOP Continuous telemetry monitoring  Continue psych medications-per p sych consult SSI and CBG's ac/hs   Ok to transfer to gen med floor   Nieves Chapa Patricia Pesa, M.D.  [facility]  Medical Director [place] Director [place] Department

## 2016-11-07 NOTE — Progress Notes (Signed)
SLP Cancellation Note  Patient Details Name: John [name]. MRN: [id number] DOB: 1975-07-02   Cancelled treatment:       Reason Eval/Treat Not Completed: Fatigue/lethargy limiting ability to participate;Patient's level of consciousness(chart reviewed; consulted NSG.). NSG indicated pt has had increased lethargy post medications earlier. Pt is not safe for any oral intake/po trials for BSE at this time d/t increased risk for aspiration secondary to poor alertness. NSG is holding any further po's while pt is exhibiting lethargy.  ST services will f/u tomorrow for BSE. Recommend strict aspiration precautions; oral care for hygiene and stimulation.    Orinda Kenner, MS, CCC-SLP John [name] 11/07/2016, 1:49 PM

## 2016-11-07 NOTE — Progress Notes (Signed)
Discussed concerns regarding lethargy and spike in temp with Dr.Kasa and Dr. Weber Cooks. No new orders at this time. Pt's room cooled and heavy blankets removed. Will continue to assess.

## 2016-11-07 NOTE — Progress Notes (Signed)
Bambi in respiratory notified.  Pt placed back on BiPap.

## 2016-11-07 NOTE — Consult Note (Signed)
[facility] Consult   Reason for Consult:  Follow up patient with schizophrenia in the ICU Referring Physician:  Humphrey Rolls Patient Identification: John [name]. MRN:  [id number] Principal Diagnosis: Paranoid schizophrenia, chronic condition ([place]) Diagnosis:   Patient Active Problem List   Diagnosis Date Noted  . Encephalopathy acute [G93.40]   . Somnolence [R40.0] 11/01/2016  . AKI (acute kidney injury) ([place]) [N17.9] 11/01/2016  . Sepsis ([place]) [A41.9] 04/27/2016  . Respiratory failure ([place]) [J96.90] 12/20/2015  . Cardiomyopathy ([place]) [I42.9] 11/28/2015  . Tobacco abuse [Z72.0] 11/28/2015  . Acute respiratory failure with hypercapnia ([name]) [J96.02]   . OSA (obstructive sleep apnea) [G47.33]   . Essential hypertension [I10] 11/25/2015  . Hypothyroidism [E03.9] 11/25/2015  . Paranoid schizophrenia, chronic condition ([place]) [F20.0] 11/25/2015  . Acute respiratory failure ([place]) [J96.00]   . Acute exacerbation of COPD with asthma ([place]) [J44.1, J45.901]   . Dyslipidemia associated with type 2 diabetes mellitus ([place]) [E11.69, E78.5]   . Diabetes mellitus due to underlying condition without complication, without long-term current use of insulin ([name]) [E08.9]   . Morbid obesity ([place]) [E66.01] 09/29/2008    Total Time spent with patient: 30 minutes  Subjective:   John [name]. is a 41 y.o. male patient admitted with nonresponsive.  HPI:  Patient less responsive since restarting meds. Heavy drooling and wet self. Non verbal  Past Psychiatric History: Chronic schizophrenia on medication but recently multiple hospital admits for medical illness  Risk to Self: Is patient at risk for suicide?: No Risk to Others:   Prior Inpatient Therapy:   Prior Outpatient Therapy:    Past Medical History:  Past Medical History:  Diagnosis Date  . Anemia   . Asthma   . Diabetes mellitus   . Morbid obesity ([place])   . Paranoid schizophrenia ([place])   . Personality disorder ([place])   .  Sleep apnea   . Thyroid disease     Past Surgical History:  Procedure Laterality Date  . FRACTURE SURGERY     Family History:  Family History  Problem Relation Age of Onset  . Glaucoma Mother    Family Psychiatric  History: none Social History:  Social History   Substance and Sexual Activity  Alcohol Use Yes   Comment: only once in a while     Social History   Substance and Sexual Activity  Drug Use No    Social History   Socioeconomic History  . Marital status: Single    Spouse name: None  . Number of children: None  . Years of education: None  . Highest education level: None  Social Needs  . Financial resource strain: None  . Food insecurity - worry: None  . Food insecurity - inability: None  . Transportation needs - medical: None  . Transportation needs - non-medical: None  Occupational History  . None  Tobacco Use  . Smoking status: Former Research scientist (life sciences)  . Smokeless tobacco: Never Used  Substance and Sexual Activity  . Alcohol use: Yes    Comment: only once in a while  . Drug use: No  . Sexual activity: None  Other Topics Concern  . None  Social History Narrative  . None   Additional Social History:    Allergies:  No Known Allergies  Labs:  Results for orders placed or performed during the hospital encounter of 11/01/16 (from the past 48 hour(s))  Basic metabolic panel     Status: Abnormal   Collection Time: 11/06/16  6:34 AM  Result Value Ref Range   Sodium 139 135 - 145 mmol/L   Potassium 3.6 3.5 - 5.1 mmol/L   Chloride 101 101 - 111 mmol/L   CO2 31 22 - 32 mmol/L   Glucose, Bld 112 (H) 65 - 99 mg/dL   BUN 9 6 - 20 mg/dL   Creatinine, Ser 0.90 0.61 - 1.24 mg/dL   Calcium 8.7 (L) 8.9 - 10.3 mg/dL   GFR calc non Af Amer >60 >60 mL/min   GFR calc Af Amer >60 >60 mL/min    Comment: (NOTE) The eGFR has been calculated using the CKD EPI equation. This calculation has not been validated in all clinical situations. eGFR's persistently <60 mL/min  signify possible Chronic Kidney Disease.    Anion gap 7 5 - 15  CBC with Differential/Platelet     Status: Abnormal   Collection Time: 11/06/16  7:41 AM  Result Value Ref Range   WBC 6.0 3.8 - 10.6 K/uL   RBC 3.98 (L) 4.40 - 5.90 MIL/uL   Hemoglobin 10.8 (L) 13.0 - 18.0 g/dL   HCT 33.4 (L) 40.0 - 52.0 %   MCV 83.8 80.0 - 100.0 fL   MCH 27.2 26.0 - 34.0 pg   MCHC 32.4 32.0 - 36.0 g/dL   RDW 14.5 11.5 - 14.5 %   Platelets 204 150 - 440 K/uL   Neutrophils Relative % 68 %   Neutro Abs 4.1 1.4 - 6.5 K/uL   Lymphocytes Relative 17 %   Lymphs Abs 1.0 1.0 - 3.6 K/uL   Monocytes Relative 14 %   Monocytes Absolute 0.8 0.2 - 1.0 K/uL   Eosinophils Relative 0 %   Eosinophils Absolute 0.0 0 - 0.7 K/uL   Basophils Relative 1 %   Basophils Absolute 0.0 0 - 0.1 K/uL  Glucose, capillary     Status: Abnormal   Collection Time: 11/06/16  7:54 AM  Result Value Ref Range   Glucose-Capillary 108 (H) 65 - 99 mg/dL  Blood gas, arterial     Status: Abnormal   Collection Time: 11/06/16  9:00 AM  Result Value Ref Range   FIO2 30.00    Delivery systems BILEVEL POSITIVE AIRWAY PRESSURE    Inspiratory PAP 14    Expiratory PAP 6    pH, Arterial 7.34 (L) 7.350 - 7.450   pCO2 arterial 71 (HH) 32.0 - 48.0 mmHg    Comment: CRITICAL RESULT CALLED TO, READ BACK BY AND VERIFIED WITH: DR KASA [id number] JGT    pO2, Arterial 73 (L) 83.0 - 108.0 mmHg   Bicarbonate 38.3 (H) 20.0 - 28.0 mmol/L   Acid-Base Excess 10.1 (H) 0.0 - 2.0 mmol/L   O2 Saturation 93.5 %   Patient temperature 37.0    Collection site RIGHT RADIAL    Sample type ARTERIAL DRAW    Allens test (pass/fail) PASS PASS  Glucose, capillary     Status: Abnormal   Collection Time: 11/06/16 11:46 AM  Result Value Ref Range   Glucose-Capillary 100 (H) 65 - 99 mg/dL  Glucose, capillary     Status: None   Collection Time: 11/06/16  6:09 PM  Result Value Ref Range   Glucose-Capillary 91 65 - 99 mg/dL  Vancomycin, trough     Status: Abnormal    Collection Time: 11/06/16  7:38 PM  Result Value Ref Range   Vancomycin Tr 11 (L) 15 - 20 ug/mL  Glucose, capillary     Status: Abnormal   Collection Time: 11/06/16  9:18 PM  Result Value Ref Range   Glucose-Capillary 120 (H) 65 - 99 mg/dL  Glucose, capillary     Status: None   Collection Time: 11/07/16  7:20 AM  Result Value Ref Range   Glucose-Capillary 97 65 - 99 mg/dL  Glucose, capillary     Status: Abnormal   Collection Time: 11/07/16 11:15 AM  Result Value Ref Range   Glucose-Capillary 142 (H) 65 - 99 mg/dL  Glucose, capillary     Status: Abnormal   Collection Time: 11/07/16  4:51 PM  Result Value Ref Range   Glucose-Capillary 115 (H) 65 - 99 mg/dL  Glucose, capillary     Status: Abnormal   Collection Time: 11/07/16  9:42 PM  Result Value Ref Range   Glucose-Capillary 121 (H) 65 - 99 mg/dL    Current Facility-Administered Medications  Medication Dose Route Frequency Provider Last Rate Last Dose  . acetaminophen (TYLENOL) tablet 650 mg  650 mg Oral Q6H PRN Lance Coon, MD   650 mg at 11/02/16 2316   Or  . acetaminophen (TYLENOL) suppository 650 mg  650 mg Rectal Q6H PRN Lance Coon, MD   650 mg at 11/07/16 2016  . atenolol (TENORMIN) tablet 50 mg  50 mg Oral Daily Gouru, Aruna, MD   50 mg at 11/07/16 1001  . atorvastatin (LIPITOR) tablet 10 mg  10 mg Oral Daily Lance Coon, MD   10 mg at 11/07/16 1001  . divalproex (DEPAKOTE ER) 24 hr tablet 2,000 mg  2,000 mg Oral QHS Vaughan Basta, MD   2,000 mg at 11/06/16 2120  . gabapentin (NEURONTIN) capsule 300 mg  300 mg Oral BID Flora Lipps, MD      . guaiFENesin-dextromethorphan (ROBITUSSIN DM) 100-10 MG/5ML syrup 5 mL  5 mL Oral Q4H PRN Vaughan Basta, MD   5 mL at 11/07/16 0119  . heparin injection 5,000 Units  5,000 Units Subcutaneous Driscilla Moats, MD   5,000 Units at 11/07/16 2211  . ibuprofen (ADVIL,MOTRIN) tablet 600 mg  600 mg Oral Q6H PRN Varughese, Bincy S, NP      . insulin aspart (novoLOG)  injection 0-5 Units  0-5 Units Subcutaneous QHS Vaughan Basta, MD      . insulin aspart (novoLOG) injection 0-9 Units  0-9 Units Subcutaneous TID WC Vaughan Basta, MD   1 Units at 11/07/16 1203  . ipratropium-albuterol (DUONEB) 0.5-2.5 (3) MG/3ML nebulizer solution 3 mL  3 mL Nebulization Q6H Gouru, Aruna, MD   3 mL at 11/07/16 1955  . latanoprost (XALATAN) 0.005 % ophthalmic solution 1 drop  1 drop Both Eyes QHS Lance Coon, MD   1 drop at 11/07/16 2215  . levothyroxine (SYNTHROID, LEVOTHROID) tablet 75 mcg  75 mcg Oral QAC breakfast Lance Coon, MD   75 mcg at 11/07/16 0748  . MEDLINE mouth rinse  15 mL Mouth Rinse BID Lance Coon, MD   15 mL at 11/07/16 1005  . ondansetron (ZOFRAN) tablet 4 mg  4 mg Oral Q6H PRN Lance Coon, MD       Or  . ondansetron [facility]) injection 4 mg  4 mg Intravenous Q6H PRN Lance Coon, MD      . pantoprazole (PROTONIX) EC tablet 40 mg  40 mg Oral Daily Lance Coon, MD   40 mg at 11/07/16 1001    Musculoskeletal: Strength & Muscle Tone: decreased Gait & Station: unable to stand Patient leans: N/A  Psychiatric Specialty Exam: Physical Exam  Nursing note and vitals reviewed. Constitutional: He appears well-developed and well-nourished.  HENT:  Head: Normocephalic and atraumatic.  Eyes: Conjunctivae are normal. Pupils are equal, round, and reactive to light.  Neck: Normal range of motion.  Cardiovascular: Normal heart sounds.  Respiratory: He is in respiratory distress.  GI: Soft.  Musculoskeletal: Normal range of motion.  Neurological: He is alert.  Skin: Skin is warm and dry.  Psychiatric: His affect is blunt. He is withdrawn. Cognition and memory are impaired. He is noncommunicative.    Review of Systems  Unable to perform ROS: Patient unresponsive    Blood pressure 132/90, pulse (!) 104, temperature (!) 100.6 F (38.1 C), temperature source Oral, resp. rate (!) 26, height _0  (1.88 m), weight (!) 148 kg (326 lb 4.5  oz), SpO2 95 %.Body mass index is 41.89 kg/m.  General Appearance: Disheveled  Eye Contact:  None  Speech:  Negative  Volume:  Decreased  Mood:  Negative  Affect:  Negative  Thought Process:  Disorganized  Orientation:  Negative  Thought Content:  Negative  Suicidal Thoughts:  No  Homicidal Thoughts:  No  Memory:  Negative  Judgement:  Negative  Insight:  Negative  Psychomotor Activity:  Negative  Concentration:  Concentration: Negative  Recall:  Negative  Fund of Knowledge:  Negative  Language:  Negative  Akathisia:  Negative  Handed:  Right  AIMS (if indicated):     Assets:  Social Support  ADL's:  Impaired  Cognition:  Impaired,  Severe  Sleep:        Treatment Plan Summary: Medication management and Plan Seeing patient this evening it seems clear he is having drooling and bedwetting, both common side effects of clozapine, and they are likely one or maybe the main cause of his episodes of respiratory difficulty and illness. 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 Will discontinue clozapine and also lithium He has not always been on these nedicines and may not need them. Once he is more alert we can try different antipsychotics.  Disposition: Supportive therapy provided about ongoing stressors.  Alethia Berthold, MD 11/07/2016 10:35 PM

## 2016-11-08 LAB — BASIC METABOLIC PANEL
Anion gap: 10 (ref 5–15)
BUN: 17 mg/dL (ref 6–20)
CALCIUM: 9 mg/dL (ref 8.9–10.3)
CO2: 31 mmol/L (ref 22–32)
CREATININE: 1.07 mg/dL (ref 0.61–1.24)
Chloride: 95 mmol/L — ABNORMAL LOW (ref 101–111)
GLUCOSE: 118 mg/dL — AB (ref 65–99)
Potassium: 3.2 mmol/L — ABNORMAL LOW (ref 3.5–5.1)
Sodium: 136 mmol/L (ref 135–145)

## 2016-11-08 LAB — GLUCOSE, CAPILLARY
GLUCOSE-CAPILLARY: 100 mg/dL — AB (ref 65–99)
GLUCOSE-CAPILLARY: 114 mg/dL — AB (ref 65–99)
Glucose-Capillary: 114 mg/dL — ABNORMAL HIGH (ref 65–99)
Glucose-Capillary: 125 mg/dL — ABNORMAL HIGH (ref 65–99)
Glucose-Capillary: 112 mg/dL — ABNORMAL HIGH (ref 65–99)

## 2016-11-08 LAB — CBC WITH DIFFERENTIAL/PLATELET
BASOS PCT: 1 %
Basophils Absolute: 0.1 10*3/uL (ref 0–0.1)
EOS PCT: 0 %
Eosinophils Absolute: 0 10*3/uL (ref 0–0.7)
HEMATOCRIT: 34.8 % — AB (ref 40.0–52.0)
Hemoglobin: 11.4 g/dL — ABNORMAL LOW (ref 13.0–18.0)
Lymphocytes Relative: 12 %
Lymphs Abs: 1.2 10*3/uL (ref 1.0–3.6)
MCH: 27.5 pg (ref 26.0–34.0)
MCHC: 32.7 g/dL (ref 32.0–36.0)
MCV: 84 fL (ref 80.0–100.0)
MONO ABS: 1.5 10*3/uL — AB (ref 0.2–1.0)
MONOS PCT: 14 %
NEUTROS ABS: 7.7 10*3/uL — AB (ref 1.4–6.5)
Neutrophils Relative %: 73 %
PLATELETS: 221 10*3/uL (ref 150–440)
RBC: 4.15 MIL/uL — ABNORMAL LOW (ref 4.40–5.90)
RDW: 14.9 % — AB (ref 11.5–14.5)
WBC: 10.5 10*3/uL (ref 3.8–10.6)

## 2016-11-08 LAB — CLOZAPINE (CLOZARIL)
CLOZAPINE LVL: 491 ng/mL (ref 350–650)
NORCLOZAPINE: 186 ng/mL
TOTAL(CLOZ+ NORCLOZ): 677 ng/mL

## 2016-11-08 MED ORDER — POTASSIUM CHLORIDE 10 MEQ/100ML IV SOLN
10.0000 meq | INTRAVENOUS | Status: AC
  Administered 2016-11-08 (×3): 10 meq via INTRAVENOUS
  Filled 2016-11-08 (×3): qty 100

## 2016-11-08 NOTE — Progress Notes (Signed)
RAPID RESPONSE:  Arrived to patients room.  Pt found to be lethargic.  Pt drooling, with adventitious breath sounds noted.  Floor RN denies giving patient any medications.  Pt placed on Bipap, and ABG drawn.  Decision made to move patient to CCU 7, for possible airway management.

## 2016-11-08 NOTE — Consult Note (Signed)
[facility] Consult   Reason for Consult: Follow-up consult 41 year old man with a history of schizophrenia currently in the hospital with respiratory distress Referring Physician: Posey Pronto Patient Identification: [name] MRN:  [id number] Principal Diagnosis: Paranoid schizophrenia, chronic condition ([place]) Diagnosis:   Patient Active Problem List   Diagnosis Date Noted  . Encephalopathy acute [G93.40]   . Somnolence [R40.0] 11/01/2016  . AKI (acute kidney injury) ([place]) [N17.9] 11/01/2016  . Sepsis ([place]) [A41.9] 04/27/2016  . Respiratory failure ([place]) [J96.90] 12/20/2015  . Cardiomyopathy ([place]) [I42.9] 11/28/2015  . Tobacco abuse [Z72.0] 11/28/2015  . Acute respiratory failure with hypercapnia ([name]) [J96.02]   . OSA (obstructive sleep apnea) [G47.33]   . Essential hypertension [I10] 11/25/2015  . Hypothyroidism [E03.9] 11/25/2015  . Paranoid schizophrenia, chronic condition ([place]) [F20.0] 11/25/2015  . Acute respiratory failure ([place]) [J96.00]   . Acute exacerbation of COPD with asthma ([place]) [J44.1, J45.901]   . Dyslipidemia associated with type 2 diabetes mellitus ([place]) [E11.69, E78.5]   . Diabetes mellitus due to underlying condition without complication, without long-term current use of insulin ([name]) [E08.9]   . Morbid obesity ([place]) [E66.01] 09/29/2008    Total Time spent with patient: 30 minutes  Subjective:   Carlito Bogert. is a 41 y.o. male patient admitted with patient not able to give information right now.  He was admitted to the hospital with respiratory distress.  HPI: Patient was sound asleep and did not wake up when I spoke his name loudly.  No point in trying to force him to wake up.  He has moved out of the intensive care unit.  Nursing reports no behavior problems.  Notes indicate that earlier today he had been able to eat and follow some commands.  Physical state clearly seems to be recovering.  When I saw him sleeping this evening he still had  a fair bit of saliva in his mouth but at least it was not pouring down the front of his gown and at least so far he had not had enuresis  Past Psychiatric History: Long-standing schizophrenia.  Appears to have been on clozapine for the last few years.  Coincident with that whether it is cause and effect or not, he has had the onset of congestive heart failure-like symptoms as well as multiple visits to the hospital for respiratory distress.  Risk to Self: Is patient at risk for suicide?: No Risk to Others:   Prior Inpatient Therapy:   Prior Outpatient Therapy:    Past Medical History:  Past Medical History:  Diagnosis Date  . Anemia   . Asthma   . Diabetes mellitus   . Morbid obesity ([place])   . Paranoid schizophrenia ([place])   . Personality disorder ([place])   . Sleep apnea   . Thyroid disease     Past Surgical History:  Procedure Laterality Date  . FRACTURE SURGERY     Family History:  Family History  Problem Relation Age of Onset  . Glaucoma Mother    Family Psychiatric  History: Unknown Social History:  Social History   Substance and Sexual Activity  Alcohol Use Yes   Comment: only once in a while     Social History   Substance and Sexual Activity  Drug Use No    Social History   Socioeconomic History  . Marital status: Single    Spouse name: None  . Number of children: None  . Years of education: None  . Highest education level: None  Social Needs  . Financial resource strain: None  . Food insecurity - worry: None  . Food insecurity - inability: None  . Transportation needs - medical: None  . Transportation needs - non-medical: None  Occupational History  . None  Tobacco Use  . Smoking status: Former Research scientist (life sciences)  . Smokeless tobacco: Never Used  Substance and Sexual Activity  . Alcohol use: Yes    Comment: only once in a while  . Drug use: No  . Sexual activity: None  Other Topics Concern  . None  Social History Narrative  . None   Additional Social  History:    Allergies:  No Known Allergies  Labs:  Results for orders placed or performed during the hospital encounter of 11/01/16 (from the past 48 hour(s))  Vancomycin, trough     Status: Abnormal   Collection Time: 11/06/16  7:38 PM  Result Value Ref Range   Vancomycin Tr 11 (L) 15 - 20 ug/mL  Glucose, capillary     Status: Abnormal   Collection Time: 11/06/16  9:18 PM  Result Value Ref Range   Glucose-Capillary 120 (H) 65 - 99 mg/dL  Clozapine (clozaril)     Status: None   Collection Time: 11/07/16  6:52 AM  Result Value Ref Range   Clozapine Lvl 491 350 - 650 ng/mL    Comment:               **Please note reference interval change**   NorClozapine 186 Not Estab. ng/mL   Total(Cloz+Norcloz) 677 ng/mL    Comment: (NOTE) Patients dosed with 400 mg clozapine daily for 4 weeks were most likely to exhibit a therapeutic effect when the sum of clozapine and norclozapine concentrations were at least 450 ng/mL. Vira Agar, et al. Rexford Maus Consensus Guidelines for Therapeutic Drug Monitoring in Psychiatry: Update 2011, Pharmacopsychiatry Sep 2011; 44(6):195-235.                                Detection Limit = 20 Performed At: [facility] [place], [place] [id number] Rush Farmer MD [id number]   Glucose, capillary     Status: None   Collection Time: 11/07/16  7:20 AM  Result Value Ref Range   Glucose-Capillary 97 65 - 99 mg/dL  Glucose, capillary     Status: Abnormal   Collection Time: 11/07/16 11:15 AM  Result Value Ref Range   Glucose-Capillary 142 (H) 65 - 99 mg/dL  Glucose, capillary     Status: Abnormal   Collection Time: 11/07/16  4:51 PM  Result Value Ref Range   Glucose-Capillary 115 (H) 65 - 99 mg/dL  Glucose, capillary     Status: Abnormal   Collection Time: 11/07/16  9:42 PM  Result Value Ref Range   Glucose-Capillary 121 (H) 65 - 99 mg/dL  CBC with Differential/Platelet     Status: Abnormal   Collection Time:  11/08/16  5:01 AM  Result Value Ref Range   WBC 10.5 3.8 - 10.6 K/uL   RBC 4.15 (L) 4.40 - 5.90 MIL/uL   Hemoglobin 11.4 (L) 13.0 - 18.0 g/dL   HCT 34.8 (L) 40.0 - 52.0 %   MCV 84.0 80.0 - 100.0 fL   MCH 27.5 26.0 - 34.0 pg   MCHC 32.7 32.0 - 36.0 g/dL   RDW 14.9 (H) 11.5 - 14.5 %   Platelets 221 150 - 440 K/uL   Neutrophils Relative %  73 %   Neutro Abs 7.7 (H) 1.4 - 6.5 K/uL   Lymphocytes Relative 12 %   Lymphs Abs 1.2 1.0 - 3.6 K/uL   Monocytes Relative 14 %   Monocytes Absolute 1.5 (H) 0.2 - 1.0 K/uL   Eosinophils Relative 0 %   Eosinophils Absolute 0.0 0 - 0.7 K/uL   Basophils Relative 1 %   Basophils Absolute 0.1 0 - 0.1 K/uL  Basic metabolic panel     Status: Abnormal   Collection Time: 11/08/16  5:01 AM  Result Value Ref Range   Sodium 136 135 - 145 mmol/L   Potassium 3.2 (L) 3.5 - 5.1 mmol/L   Chloride 95 (L) 101 - 111 mmol/L   CO2 31 22 - 32 mmol/L   Glucose, Bld 118 (H) 65 - 99 mg/dL   BUN 17 6 - 20 mg/dL   Creatinine, Ser 1.07 0.61 - 1.24 mg/dL   Calcium 9.0 8.9 - 10.3 mg/dL   GFR calc non Af Amer >60 >60 mL/min   GFR calc Af Amer >60 >60 mL/min    Comment: (NOTE) The eGFR has been calculated using the CKD EPI equation. This calculation has not been validated in all clinical situations. eGFR's persistently <60 mL/min signify possible Chronic Kidney Disease.    Anion gap 10 5 - 15  Glucose, capillary     Status: Abnormal   Collection Time: 11/08/16  7:38 AM  Result Value Ref Range   Glucose-Capillary 114 (H) 65 - 99 mg/dL  Glucose, capillary     Status: Abnormal   Collection Time: 11/08/16 12:04 PM  Result Value Ref Range   Glucose-Capillary 125 (H) 65 - 99 mg/dL  Glucose, capillary     Status: Abnormal   Collection Time: 11/08/16  4:40 PM  Result Value Ref Range   Glucose-Capillary 112 (H) 65 - 99 mg/dL    Current Facility-Administered Medications  Medication Dose Route Frequency Provider Last Rate Last Dose  . acetaminophen (TYLENOL) tablet 650 mg   650 mg Oral Q6H PRN Lance Coon, MD   650 mg at 11/02/16 2316   Or  . acetaminophen (TYLENOL) suppository 650 mg  650 mg Rectal Q6H PRN Lance Coon, MD   650 mg at 11/07/16 2016  . atenolol (TENORMIN) tablet 50 mg  50 mg Oral Daily Gouru, Aruna, MD   50 mg at 11/08/16 1115  . atorvastatin (LIPITOR) tablet 10 mg  10 mg Oral Daily Lance Coon, MD   10 mg at 11/08/16 1115  . divalproex (DEPAKOTE ER) 24 hr tablet 2,000 mg  2,000 mg Oral QHS Vaughan Basta, MD   2,000 mg at 11/06/16 2120  . gabapentin (NEURONTIN) capsule 300 mg  300 mg Oral BID Flora Lipps, MD   300 mg at 11/08/16 1115  . guaiFENesin-dextromethorphan (ROBITUSSIN DM) 100-10 MG/5ML syrup 5 mL  5 mL Oral Q4H PRN Vaughan Basta, MD   5 mL at 11/07/16 0119  . heparin injection 5,000 Units  5,000 Units Subcutaneous Driscilla Moats, MD   5,000 Units at 11/08/16 1358  . ibuprofen (ADVIL,MOTRIN) tablet 600 mg  600 mg Oral Q6H PRN Varughese, Bincy S, NP      . insulin aspart (novoLOG) injection 0-5 Units  0-5 Units Subcutaneous QHS Vaughan Basta, MD      . insulin aspart (novoLOG) injection 0-9 Units  0-9 Units Subcutaneous TID WC Vaughan Basta, MD   1 Units at 11/08/16 1257  . ipratropium-albuterol (DUONEB) 0.5-2.5 (3) MG/3ML nebulizer solution 3 mL  3 mL Nebulization Q6H Gouru, Aruna, MD   3 mL at 11/08/16 1356  . latanoprost (XALATAN) 0.005 % ophthalmic solution 1 drop  1 drop Both Eyes QHS Lance Coon, MD   1 drop at 11/07/16 2215  . levothyroxine (SYNTHROID, LEVOTHROID) tablet 75 mcg  75 mcg Oral QAC breakfast Lance Coon, MD   75 mcg at 11/08/16 0757  . MEDLINE mouth rinse  15 mL Mouth Rinse BID Lance Coon, MD   15 mL at 11/08/16 1058  . ondansetron (ZOFRAN) tablet 4 mg  4 mg Oral Q6H PRN Lance Coon, MD       Or  . ondansetron [facility]) injection 4 mg  4 mg Intravenous Q6H PRN Lance Coon, MD      . pantoprazole (PROTONIX) EC tablet 40 mg  40 mg Oral Daily Lance Coon, MD   40 mg at  11/08/16 1115    Musculoskeletal: Strength & Muscle Tone: decreased Gait & Station: unsteady Patient leans: N/A  Psychiatric Specialty Exam: Physical Exam  Nursing note and vitals reviewed. Constitutional: He appears well-developed and well-nourished.  HENT:  Head: Normocephalic and atraumatic.  Eyes: Conjunctivae are normal. Pupils are equal, round, and reactive to light.  Neck: Normal range of motion.  Cardiovascular: Regular rhythm and normal heart sounds.  Respiratory: Effort normal. No respiratory distress. He has no rales.  GI: Soft.  Musculoskeletal: Normal range of motion.  Neurological: He is alert.  Skin: Skin is warm and dry.  Psychiatric: His affect is blunt. His speech is delayed. He is slowed.    Review of Systems  Unable to perform ROS: Patient unresponsive    Blood pressure (!) 142/90, pulse 100, temperature 99.6 F (37.6 C), temperature source Oral, resp. rate 20, height 6' (1.829 m), weight (!) 147.6 kg (325 lb 8 oz), SpO2 98 %.Body mass index is 44.15 kg/m.  General Appearance: Disheveled  Eye Contact:  None  Speech:  Negative  Volume:  Decreased  Mood:  Negative  Affect:  Negative  Thought Process:  NA  Orientation:  Negative  Thought Content:  Negative  Suicidal Thoughts:  No  Homicidal Thoughts:  No  Memory:  Negative  Judgement:  Negative  Insight:  Negative  Psychomotor Activity:  Negative  Concentration:  Concentration: Negative  Recall:  Negative  Fund of Knowledge:  Negative  Language:  Negative  Akathisia:  Negative  Handed:  Right  AIMS (if indicated):     Assets:  Social Support  ADL's:  Impaired  Cognition:  Impaired,  Moderate and Severe  Sleep:        Treatment Plan Summary: Medication management and Plan 41 year old man with schizophrenia who is now recovering from his respiratory distress.  Last night I discontinued his clozapine and lithium after seeing how he was doing back on those medicines.  I think there is enough  circumstantial evidence of any problematic side effects from clozapine that I would not recommend restarting that medication.  It looks like he had been fairly well controlled on other antipsychotics in the past.  I am not going to start anything up new tonight since he is still pretty sedated and not having behavior problems.  We should probably think about restarting some kind of antipsychotic before he is discharged home.  I think I would probably also hold off on restarting the lithium for now as well but the Depakote should be safe to continue.  I will follow-up tomorrow.  Disposition: Patient does not meet criteria for psychiatric inpatient  admission. Supportive therapy provided about ongoing stressors.  Alethia Berthold, MD 11/08/2016 6:28 PM

## 2016-11-08 NOTE — Evaluation (Signed)
Physical Therapy Evaluation Patient Details Name: John [name]. MRN: [id number] DOB: [birth year] Today's Date: 11/08/2016   History of Present Illness  Pt is a 41 yo male admitted 11/01/16 with acute encephalopathy of unknown etiology, acute renal failure, and possible pneumonia. He was transferred to the stepdown unit 11/5 due to worsening encephalopathy likely secondary to hypercapnia requiring Bipap.  Assessment includes: Respiratory distress from polysubstance abuse with multiple psychiatric drugs, acute encephalopathy secondary to hypercapnia, and acute renal failure.  PMH includes OSA, asthma, personality disorder, paranoid schizophrenia, and DM.     Clinical Impression  Pt presents with deficits in strength, transfers, mobility, gait, balance, and activity tolerance.  Pt required mod A with sup to sit and +2 max A with sit to sup with verbal and tactile cues for sequencing.  Pt's SpO2 decreased to upper 70's while sitting at EOB with nurse present.  Pt given verbal cues for breathing technique with SpO2 quickly returning to baseline of upper 90's.  Multiple attempts made to stand from EOB with pt unable to clear EOG.  Pt presents with profound functional weakness at this time and will benefit from PT services in a SNF setting upon discharge to safely address above deficits for decreased caregiver assistance and eventual return to PLOF.      Follow Up Recommendations SNF    Equipment Recommendations  Other (comment)(TBD at next venue of care)    Recommendations for Other Services       Precautions / Restrictions Precautions Precautions: Fall Restrictions Weight Bearing Restrictions: No      Mobility  Bed Mobility Overal bed mobility: Needs Assistance Bed Mobility: Supine to Sit;Sit to Supine     Supine to sit: Mod assist Sit to supine: +2 for physical assistance;Max assist      Transfers Overall transfer level: Needs assistance Equipment used: Rolling walker (2  wheeled) Transfers: Sit to/from Stand Sit to Stand: Total assist         General transfer comment: Unable to clear EOB during transfer attempts  Ambulation/Gait             General Gait Details: Unable  Stairs            Wheelchair Mobility    Modified Rankin (Stroke Patients Only)       Balance Overall balance assessment: Needs assistance Sitting-balance support: Bilateral upper extremity supported;Feet unsupported;Feet supported Sitting balance-Leahy Scale: Fair         Standing balance comment: Unable                             Pertinent Vitals/Pain Pain Assessment: 0-10 Pain Location: BLEs, pt unable to describe or rate intensity of pain    Home Living Family/patient expects to be discharged to:: Group home                 Additional Comments: Pt is a poor historian with accuracy of pt's history and PLOF uncertain.     Prior Function Level of Independence: Independent         Comments: Per pt group home staff assists with meals with pt Ind with amb without AD, Ind with bed mob and transfers, and no fall history     Hand Dominance        Extremity/Trunk Assessment   Upper Extremity Assessment Upper Extremity Assessment: Generalized weakness    Lower Extremity Assessment Lower Extremity Assessment: Generalized weakness(Unable to MMT BLEs secondary to BLE  pain, nursing aware)       Communication   Communication: Expressive difficulties(Extra time required to answer questions and to follow 1-step commands)  Cognition Arousal/Alertness: Lethargic Behavior During Therapy: WFL for tasks assessed/performed Overall Cognitive Status: No family/caregiver present to determine baseline cognitive functioning                                 General Comments: Pt slow to answer questions with difficulty providing any detailed information, able to follow most 1-step commands with time and cues      General  Comments      Exercises Total Joint Exercises Ankle Circles/Pumps: AAROM;Both;10 reps;15 reps Quad Sets: AAROM;Both;5 reps;10 reps Short Arc Quad: Both;10 reps;AAROM Heel Slides: AAROM;Both;5 reps;10 reps Hip ABduction/ADduction: AAROM;Both;5 reps Straight Leg Raises: AAROM;Both;5 reps   Assessment/Plan    PT Assessment Patient needs continued PT services  PT Problem List Decreased strength;Decreased activity tolerance;Decreased balance;Decreased knowledge of use of DME;Decreased mobility       PT Treatment Interventions DME instruction;Gait training;Functional mobility training;Neuromuscular re-education;Balance training;Therapeutic exercise;Therapeutic activities;Patient/family education    PT Goals (Current goals can be found in the Care Plan section)  Acute Rehab PT Goals PT Goal Formulation: Patient unable to participate in goal setting Time For Goal Achievement: 11/21/16 Potential to Achieve Goals: Fair    Frequency Min 2X/week   Barriers to discharge Inaccessible home environment      Co-evaluation               AM-PAC PT "6 Clicks" Daily Activity  Outcome Measure Difficulty turning over in bed (including adjusting bedclothes, sheets and blankets)?: Unable Difficulty moving from lying on back to sitting on the side of the bed? : Unable Difficulty sitting down on and standing up from a chair with arms (e.g., wheelchair, bedside commode, etc,.)?: Unable Help needed moving to and from a bed to chair (including a wheelchair)?: Total Help needed walking in hospital room?: Total Help needed climbing 3-5 steps with a railing? : Total 6 Click Score: 6    End of Session   Activity Tolerance: Patient limited by fatigue Patient left: in bed;with call bell/phone within reach;with bed alarm set Nurse Communication: Mobility status PT Visit Diagnosis: Muscle weakness (generalized) (M62.81);Difficulty in walking, not elsewhere classified (R26.2)    Time:  5883-2549 PT Time Calculation (min) (ACUTE ONLY): 24 min   Charges:   PT Evaluation $PT Eval Low Complexity: 1 Low PT Treatments $Therapeutic Exercise: 8-22 mins   PT G Codes:   PT G-Codes **NOT FOR INPATIENT CLASS** Functional Assessment Tool Used: AM-PAC 6 Clicks Basic Mobility Functional Limitation: Mobility: Walking and moving around Mobility: Walking and Moving Around Current Status (I2641): 100 percent impaired, limited or restricted Mobility: Walking and Moving Around Goal Status (R8309): At least 40 percent but less than 60 percent impaired, limited or restricted    D. Scott Shanon Seawright PT, DPT 11/08/16, 12:07 PM

## 2016-11-08 NOTE — Progress Notes (Signed)
[facility] at [place] NAME: John [name]    MR#:  [id number]  DATE OF BIRTH:  26-May-1975  SUBJECTIVE:  CHIEF COMPLAINT:   Chief Complaint  Patient presents with  . Altered Mental Status   Patient more awake this morning, no bowel movement REVIEW OF SYSTEMS:   CONSTITUTIONAL: No documented fever. No fatigue, weakness. No weight gain, no weight loss.  EYES: No blurry or double vision.  ENT: No tinnitus. No postnasal drip. No redness of the oropharynx.  RESPIRATORY: No cough, no wheeze, no hemoptysis. No dyspnea.  CARDIOVASCULAR: No chest pain. No orthopnea. No palpitations. No syncope.  GASTROINTESTINAL: No nausea, no vomiting or diarrhea. No abdominal pain. No melena or hematochezia.  GENITOURINARY:  No urgency. No frequency. No dysuria. No hematuria. No obstructive symptoms. No discharge. No pain. No significant abnormal bleeding ENDOCRINE: No polyuria or nocturia. No heat or cold intolerance.  HEMATOLOGY: No anemia. No bruising. No bleeding. No purpura. No petechiae INTEGUMENTARY: No rashes. No lesions.  MUSCULOSKELETAL: No arthritis. No swelling. No gout.  NEUROLOGIC: No numbness, tingling, or ataxia. No seizure-type activity.  PSYCHIATRIC: No anxiety. No insomnia. No ADD.      DRUG ALLERGIES:  No Known Allergies  VITALS:  Blood pressure 126/85, pulse (!) 114, temperature 99.5 F (37.5 C), temperature source Oral, resp. rate (!) 21, height 6\' 2"  (1.88 m), weight (!) 320 lb 1.7 oz (145.2 kg), SpO2 96 %.  PHYSICAL EXAMINATION:  GENERAL:  41 y.o.-year-old patient lying in the bed with no acute distress.  EYES: Pupils equal, round, reactive to light and accommodation. No scleral icterus.  HEENT: Head atraumatic, normocephalic. Oropharynx and nasopharynx clear.  NECK:  Supple, no jugular venous distention. No thyroid enlargement, no tenderness.  LUNGS: mod breath sounds bilaterally, no wheezing, have b/l crepitation. No use of accessory  muscles of respiration.  On BiPAP CARDIOVASCULAR: S1, S2 normal. No murmurs, rubs, or gallops.  ABDOMEN: Soft, nontender, nondistended. Bowel sounds present. No organomegaly or mass.  EXTREMITIES: No pedal edema, cyanosis, or clubbing.  NEUROLOGIC: Delirious PSYCHIATRIC: The patient is disoriented  SKIN: No obvious rash, lesion, or ulcer.   Physical Exam LABORATORY PANEL:   CBC Recent Labs  Lab 11/08/16 0501  WBC 10.5  HGB 11.4*  HCT 34.8*  PLT 221   ------------------------------------------------------------------------------------------------------------------  Chemistries  Recent Labs  Lab 11/01/16 1733  11/08/16 0501  NA 137   < > 136  K 3.3*   < > 3.2*  CL 99*   < > 95*  CO2 28   < > 31  GLUCOSE 93   < > 118*  BUN 21*   < > 17  CREATININE 2.83*   < > 1.07  CALCIUM 8.3*   < > 9.0  AST 29  --   --   ALT 16*  --   --   ALKPHOS 32*  --   --   BILITOT 0.9  --   --    < > = values in this interval not displayed.   ------------------------------------------------------------------------------------------------------------------  Cardiac Enzymes No results for input(s): TROPONINI in the last 168 hours. ------------------------------------------------------------------------------------------------------------------  RADIOLOGY:  No results found.  ASSESSMENT AND PLAN:    *Acute hypoxic respiratory failure with CO2 narcosis Currently off BiPAP  follow-up with intensivist His symptoms have improved  * Sepsis ([place]) -  Pneumonia with parapneumonic effusion Sputum culture is growing bacteria, await final results  Broad spectrum antibiotics  *Hypokalemia replace potassium   *  AKI (acute kidney injury) ([place]) -dehydration Resolved,     *  Diabetes mellitus due to underlying condition without complication, without long-term current use of insulin ([name]) -sliding scale insulin with corresponding glucose checks   HBA1C is 5.7  *  Essential hypertension  -continue atenolol at 50 mg,   *  OSA (obstructive sleep apnea) -BiPAP nightly  *  Hypothyroidism -home dose thyroid replacement   Physical therapy consult pending   All the records are reviewed and case discussed with Care Management/Social Workerr. Management plans discussed with the RN and intensivist  CODE STATUS: Full.  TOTAL TIME TAKING CARE OF THIS PATIENT: 35 minutes.     POSSIBLE D/C IN 1 DAYS, DEPENDING ON CLINICAL CONDITION.   Dustin Flock M.D on 11/08/2016   Between 7am to 6pm - Pager - [phone]  After 6pm go to [email or web address] - password [email or web address] [facility]  Office  [phone]  CC: Primary care physician; Cletis Athens, MD  Note: This dictation was prepared with Dragon dictation along with smaller phrase technology. Any transcriptional errors that result from this process are unintentional.

## 2016-11-08 NOTE — Progress Notes (Signed)
VS stable Patient with polypharmacy from psych meds  1.follow up Psych recommendations 2.BiPAP at night for OSA/OHS as prescribed  Will sign off at this time Please call with any questions    Corrin Parker, M.D.  [facility]  Medical Director [place] Director [facility] Cardio-Pulmonary Department

## 2016-11-08 NOTE — Consult Note (Signed)
   Name: John [name]. MRN: [id number] DOB: May 26, 1975    ADMISSION DATE:  11/01/2016 CONSULTATION DATE: 11/05/2016  REFERRING MD : Dr. Bridgett Larsson   CHIEF COMPLAINT: Altered mental status    BRIEF PATIENT DESCRIPTION:  42 yo male admitted 11/1 with acute encephalopathy of unknown etiology, acute renal failure, and possible pneumonia.  He was transferred to the stepdown unit 11/5 due to worsening encephalopathy likely secondary to hypercapnia requiring Bipap   SIGNIFICANT EVENTS  11/1-Pt admitted to [facility] unit 11/5-Pt transferred to stepdown unit due to increased lethargy requiring Bipap  11/6 off biPAP  STUDIES:  CT Head 11/1>>Negative  Echo 11/2>>EF 50%   HISTORY OF PRESENT ILLNESS:   Alert and awake Follows commands Off of biPAP  REVIEW OF SYSTEMS:   ALL other ROS negative   VITAL SIGNS: Temp:  [99.5 F (37.5 C)-101.8 F (38.8 C)] 99.5 F (37.5 C) (11/08 0200) Pulse Rate:  [97-132] 98 (11/08 0700) Resp:  [15-34] 15 (11/08 0700) BP: (112-150)/(79-103) 126/79 (11/08 0700) SpO2:  [94 %-100 %] 100 % (11/08 0700) FiO2 (%):  [30 %] 30 % (11/08 0249) Weight:  [320 lb 1.7 oz (145.2 kg)] 320 lb 1.7 oz (145.2 kg) (11/08 0500)  PHYSICAL EXAMINATION: General: well developed, well nourished male, NAD  Neuro: lethargic, follows commands, PERRL HEENT: supple, no JVD Cardiovascular: nsr, s1s2, no M/R/G Lungs: faint expiratory wheezes throughout, even, non labored on Bipap Abdomen: +BS x4, obese, soft, non tender, non distended  Musculoskeletal: normal bulk and tone, no edema  Skin: intact no rashes or lesions   Recent Labs  Lab 11/04/16 0410 11/06/16 0634 11/08/16 0501  NA 139 139 136  K 3.7 3.6 3.2*  CL 101 101 95*  CO2 31 31 31   BUN 6 9 17   CREATININE 0.89 0.90 1.07  GLUCOSE 121* 112* 118*   Recent Labs  Lab 11/04/16 0410 11/06/16 0741 11/08/16 0501  HGB 11.1* 10.8* 11.4*  HCT 34.7* 33.4* 34.8*  WBC 5.4 6.0 10.5  PLT 158 204 221    ASSESSMENT /  PLAN: 41 yo morbidly obese AAM with resp distress from polysubstance abuse with multiple phyciatric drugs in setting of OHS/OSA   Acute encephalopathy secondary to hypercapnia-resolving Acute renal failure-resolved Hx: OSA, Asthma, Personality Disorder, Paranoid Schizophrenia, and Diabetes Mellitus  Maintain O2 sats >92% Continue bronchodilator therapy  Replace electrolytes as indicated  Monitor UOP Continuous telemetry monitoring  Continue psych medications-per psych consult SSI and CBG's ac/hs   Ok to transfer to gen med floor   Girl Schissler Patricia Pesa, M.D.  [facility]  Medical Director [place] Director [place] Department

## 2016-11-08 NOTE — Progress Notes (Signed)
Speech Therapy Note: reviewed chart notes; consulted NSG re: pt's status. Pt was able to eat some breakfast w/ NSG when off BiPAP w/ no gross, overt s/s of aspiration noted by NSG.  Will f/u w/ pt's status and toleration of diet tomorrow. NSG agreed.   Orinda Kenner, [place], CCC-SLP

## 2016-11-09 ENCOUNTER — Inpatient Hospital Stay: Payer: 59

## 2016-11-09 LAB — GLUCOSE, CAPILLARY
GLUCOSE-CAPILLARY: 189 mg/dL — AB (ref 65–99)
GLUCOSE-CAPILLARY: 156 mg/dL — AB (ref 65–99)
Glucose-Capillary: 109 mg/dL — ABNORMAL HIGH (ref 65–99)
Glucose-Capillary: 110 mg/dL — ABNORMAL HIGH (ref 65–99)
Glucose-Capillary: 98 mg/dL (ref 65–99)
Glucose-Capillary: 99 mg/dL (ref 65–99)

## 2016-11-09 LAB — BASIC METABOLIC PANEL
Anion gap: 10 (ref 5–15)
BUN: 21 mg/dL — ABNORMAL HIGH (ref 6–20)
CALCIUM: 9.1 mg/dL (ref 8.9–10.3)
CO2: 30 mmol/L (ref 22–32)
CREATININE: 1.02 mg/dL (ref 0.61–1.24)
Chloride: 97 mmol/L — ABNORMAL LOW (ref 101–111)
GFR calc Af Amer: >60 mL/min (ref >60)
GFR calc non Af Amer: >60 mL/min (ref >60)
GLUCOSE: 111 mg/dL — AB (ref 65–99)
Potassium: 3.4 mmol/L — ABNORMAL LOW (ref 3.5–5.1)
Sodium: 137 mmol/L (ref 135–145)

## 2016-11-09 MED ORDER — ASENAPINE MALEATE 5 MG SL SUBL
5.0000 mg | SUBLINGUAL_TABLET | Freq: Two times a day (BID) | SUBLINGUAL | Status: DC
  Administered 2016-11-09 – 2016-11-14 (×10): 5 mg via SUBLINGUAL
  Filled 2016-11-09 (×12): qty 1

## 2016-11-09 MED ORDER — POTASSIUM CHLORIDE 20 MEQ PO PACK
40.0000 meq | PACK | Freq: Once | ORAL | Status: AC
  Administered 2016-11-09: 40 meq via ORAL
  Filled 2016-11-09: qty 2

## 2016-11-09 MED ORDER — IBUPROFEN 400 MG PO TABS: 600.0000 mg | ORAL_TABLET | Freq: Four times a day (QID) | ORAL | Status: DC | PRN

## 2016-11-09 NOTE — Progress Notes (Signed)
[place] at [place] NAME: John [name]    MR#:  [id number]  DATE OF BIRTH:  01-Sep-1975  SUBJECTIVE:  CHIEF COMPLAINT:   Chief Complaint  Patient presents with  . Altered Mental Status   Patient was transferred to the floor and had to return back due to decreasing responsiveness REVIEW OF SYSTEMS:   CONSTITUTIONAL: On BiPAP with decreased responsiveness    DRUG ALLERGIES:  No Known Allergies  VITALS:  Blood pressure 120/80, pulse (!) 109, temperature 99.8 F (37.7 C), temperature source Axillary, resp. rate (!) 23, height 6\' 2"  (1.88 m), weight (!) 322 lb 1.5 oz (146.1 kg), SpO2 96 %.  PHYSICAL EXAMINATION:  GENERAL:  41 y.o.-year-old patient lying in the bed with no acute distress.  EYES: Pupils equal, round, reactive to light and accommodation. No scleral icterus.  HEENT: Head atraumatic, normocephalic. Oropharynx and nasopharynx clear.  NECK:  Supple, no jugular venous distention. No thyroid enlargement, no tenderness.  LUNGS: mod breath sounds bilaterally, no wheezing, have b/l crepitation. No use of accessory muscles of respiration.  On BiPAP CARDIOVASCULAR: S1, S2 normal. No murmurs, rubs, or gallops.  ABDOMEN: Soft, nontender, nondistended. Bowel sounds present. No organomegaly or mass.  EXTREMITIES: No pedal edema, cyanosis, or clubbing.  NEUROLOGIC: Poor responsiveness PSYCHIATRIC: Poor responsiveness SKIN: No obvious rash, lesion, or ulcer.   Physical Exam LABORATORY PANEL:   CBC Recent Labs  Lab 11/08/16 0501  WBC 10.5  HGB 11.4*  HCT 34.8*  PLT 221   ------------------------------------------------------------------------------------------------------------------  Chemistries  Recent Labs  Lab 11/09/16 0521  NA 137  K 3.4*  CL 97*  CO2 30  GLUCOSE 111*  BUN 21*  CREATININE 1.02  CALCIUM 9.1    ------------------------------------------------------------------------------------------------------------------  Cardiac Enzymes No results for input(s): TROPONINI in the last 168 hours. ------------------------------------------------------------------------------------------------------------------  RADIOLOGY:  Dg Abd Portable 1v  Result Date: 11/09/2016 CLINICAL DATA:  Nasogastric tube placement. EXAM: PORTABLE ABDOMEN - 1 VIEW COMPARISON:  Chest x-ray 11/06/2016 FINDINGS: Nasogastric tube is in place, tip overlying the level of the stomach. There is gaseous distension of numerous bowel loops in the upper abdomen. No evidence for free intraperitoneal air. IMPRESSION: Nasogastric tube placement, tip to the stomach. Distended bowel loops in the upper abdomen. Bowel obstruction is not excluded. Consider dedicated views of the abdomen if the patient is asymptomatic. Electronically Signed   By: Nolon Nations M.D.   On: 11/09/2016 12:25    ASSESSMENT AND PLAN:    *Acute hypoxic respiratory failure with CO2 narcosis Back on BiPAP Intensivist following the patient Discussed with Dr. Christel Mormon he seems to feel that this may be related to his psychiatric medications  * Sepsis ([place]) -  Pneumonia with parapneumonic effusion Sputum culture is multiple bacteria consistent with respiratory flora Finish course of antibiotics  *Hypokalemia replace potassium  *Schizophrenia management per psychiatry  *  AKI (acute kidney injury) ([place]) -dehydration Resolved,     *  Diabetes mellitus due to underlying condition without complication, without long-term current use of insulin ([place]) -sliding scale insulin with corresponding glucose checks   HBA1C is 5.7  *  Essential hypertension -continue atenolol at 50 mg,   *  OSA (obstructive sleep apnea) -BiPAP nightly  *  Hypothyroidism -home dose thyroid replacement   Physical therapy consult pending   All the records are reviewed and case  discussed with Care Management/Social Workerr. Management plans discussed with the RN and intensivist  CODE STATUS: Full.  TOTAL TIME TAKING CARE  OF THIS PATIENT: 35 minutes.     POSSIBLE D/C IN 1 DAYS, DEPENDING ON CLINICAL CONDITION.   Dustin Flock M.D on 11/09/2016   Between 7am to 6pm - Pager - [phone]  After 6pm go to [email or web address] - password [email or web address] [facility]  Office  [phone]  CC: Primary care physician; Cletis Athens, MD  Note: This dictation was prepared with Dragon dictation along with smaller phrase technology. Any transcriptional errors that result from this process are unintentional.

## 2016-11-09 NOTE — Progress Notes (Signed)
SLP Cancellation Note  Patient Details Name: John [name]. MRN: [id number] DOB: 06/14/1975   Cancelled treatment:       Reason Eval/Treat Not Completed: Fatigue/lethargy limiting ability to participate;Medical issues which prohibited therapy(chart reviewed; consulted NSG. ). Pt is not appropriate, or safe, for any oral intake at this time. Pt is more lethargic and currently on BiPAP for increased O2 support. NSG is also concerned about abdominal/GI issues. SLP discussed w/ NSG concerns re: pt's safety w/ oral intake when he is intermittently lethargic and needing increased O2 support; pt has been on a regular diet w/ thin liquids per MD order. ST services has been unable to assess safety of swallowing, however, CCU NSG staff have monitored pt when swallowing any oral intake including pills and have reported toleration of po's.  ST services will f/u w/ pt's status tomorrow. Recommended NPO status unless pt is fully awake, alert and verbally conversive w/out declined Pulmonary status, or GI status. NSG did mention that pt has stated he is "not hungry" often. Recommended frequent oral care at this time for hygiene and stimulation for swallowing.    Orinda Kenner, [place], CCC-SLP John [name] 11/09/2016, 8:26 AM

## 2016-11-09 NOTE — Consult Note (Signed)
[facility] Face-to-Face Psychiatry Consult   Reason for Consult: Consult for 41 year old man follow-up in the intensive care unit.  He was transferred back to the ICU last night because of decreased responsiveness.  On interview today I found the patient awake alert and more responsive than I have at any time since I have seen him. Referring Physician: Posey Pronto Patient Identification: John [name]. MRN:  [id number] Principal Diagnosis: Paranoid schizophrenia, chronic condition ([place]) Diagnosis:   Patient Active Problem List   Diagnosis Date Noted  . Encephalopathy acute [G93.40]   . Somnolence [R40.0] 11/01/2016  . AKI (acute kidney injury) ([place]) [N17.9] 11/01/2016  . Sepsis ([place]) [A41.9] 04/27/2016  . Respiratory failure ([place]) [J96.90] 12/20/2015  . Cardiomyopathy ([place]) [I42.9] 11/28/2015  . Tobacco abuse [Z72.0] 11/28/2015  . Acute respiratory failure with hypercapnia ([name]) [J96.02]   . OSA (obstructive sleep apnea) [G47.33]   . Essential hypertension [I10] 11/25/2015  . Hypothyroidism [E03.9] 11/25/2015  . Paranoid schizophrenia, chronic condition ([place]) [F20.0] 11/25/2015  . Acute respiratory failure ([place]) [J96.00]   . Acute exacerbation of COPD with asthma ([place]) [J44.1, J45.901]   . Dyslipidemia associated with type 2 diabetes mellitus ([place]) [E11.69, E78.5]   . Diabetes mellitus due to underlying condition without complication, without long-term current use of insulin ([name]) [E08.9]   . Morbid obesity ([place]) [E66.01] 09/29/2008    Total Time spent with patient: 30 minutes  Subjective:   John [name]. is a 41 y.o. male patient admitted with patient does not have much insight into why he was admitted.Marland Kitchen  HPI: 41 year old man with schizoaffective disorder and developmental disability who is in the hospital with an extended sickness with multiple episodes of unresponsiveness respiratory distress.  Psychiatrically I had held his clozapine and lithium out of concern that side effects of  these were worsening his situation.  Today I find him awake and alert.  Interactive fairly appropriately although his thoughts are disorganized and scattered and he starts to make some stranger comments the longer I talk with him.  Not threatening or assaultive and generally cooperative with treatment.  Patient is currently on nasal cannula suction which limits very much what can be given to him therapeutically  Past Psychiatric History: Long-standing mental health problems.  Past history of serious episodes of psychosis more recently well controlled on clozapine and lithium and Depakote although the side effects may have been worsening some of his medical problems  Risk to Self: Is patient at risk for suicide?: No Risk to Others:   Prior Inpatient Therapy:   Prior Outpatient Therapy:    Past Medical History:  Past Medical History:  Diagnosis Date  . Anemia   . Asthma   . Diabetes mellitus   . Morbid obesity ([place])   . Paranoid schizophrenia ([place])   . Personality disorder ([place])   . Sleep apnea   . Thyroid disease     Past Surgical History:  Procedure Laterality Date  . FRACTURE SURGERY     Family History:  Family History  Problem Relation Age of Onset  . Glaucoma Mother    Family Psychiatric  History: None known Social History:  Social History   Substance and Sexual Activity  Alcohol Use Yes   Comment: only once in a while     Social History   Substance and Sexual Activity  Drug Use No    Social History   Socioeconomic History  . Marital status: Single    Spouse name: None  . Number of children:  None  . Years of education: None  . Highest education level: None  Social Needs  . Financial resource strain: None  . Food insecurity - worry: None  . Food insecurity - inability: None  . Transportation needs - medical: None  . Transportation needs - non-medical: None  Occupational History  . None  Tobacco Use  . Smoking status: Former Research scientist (life sciences)  . Smokeless tobacco:  Never Used  Substance and Sexual Activity  . Alcohol use: Yes    Comment: only once in a while  . Drug use: No  . Sexual activity: None  Other Topics Concern  . None  Social History Narrative  . None   Additional Social History:    Allergies:  No Known Allergies  Labs:  Results for orders placed or performed during the hospital encounter of 11/01/16 (from the past 48 hour(s))  Glucose, capillary     Status: Abnormal   Collection Time: 11/07/16  9:42 PM  Result Value Ref Range   Glucose-Capillary 121 (H) 65 - 99 mg/dL  CBC with Differential/Platelet     Status: Abnormal   Collection Time: 11/08/16  5:01 AM  Result Value Ref Range   WBC 10.5 3.8 - 10.6 K/uL   RBC 4.15 (L) 4.40 - 5.90 MIL/uL   Hemoglobin 11.4 (L) 13.0 - 18.0 g/dL   HCT 34.8 (L) 40.0 - 52.0 %   MCV 84.0 80.0 - 100.0 fL   MCH 27.5 26.0 - 34.0 pg   MCHC 32.7 32.0 - 36.0 g/dL   RDW 14.9 (H) 11.5 - 14.5 %   Platelets 221 150 - 440 K/uL   Neutrophils Relative % 73 %   Neutro Abs 7.7 (H) 1.4 - 6.5 K/uL   Lymphocytes Relative 12 %   Lymphs Abs 1.2 1.0 - 3.6 K/uL   Monocytes Relative 14 %   Monocytes Absolute 1.5 (H) 0.2 - 1.0 K/uL   Eosinophils Relative 0 %   Eosinophils Absolute 0.0 0 - 0.7 K/uL   Basophils Relative 1 %   Basophils Absolute 0.1 0 - 0.1 K/uL  Basic metabolic panel     Status: Abnormal   Collection Time: 11/08/16  5:01 AM  Result Value Ref Range   Sodium 136 135 - 145 mmol/L   Potassium 3.2 (L) 3.5 - 5.1 mmol/L   Chloride 95 (L) 101 - 111 mmol/L   CO2 31 22 - 32 mmol/L   Glucose, Bld 118 (H) 65 - 99 mg/dL   BUN 17 6 - 20 mg/dL   Creatinine, Ser 1.07 0.61 - 1.24 mg/dL   Calcium 9.0 8.9 - 10.3 mg/dL   GFR calc non Af Amer >60 >60 mL/min   GFR calc Af Amer >60 >60 mL/min    Comment: (NOTE) The eGFR has been calculated using the CKD EPI equation. This calculation has not been validated in all clinical situations. eGFR's persistently <60 mL/min signify possible Chronic Kidney Disease.     Anion gap 10 5 - 15  Glucose, capillary     Status: Abnormal   Collection Time: 11/08/16  7:38 AM  Result Value Ref Range   Glucose-Capillary 114 (H) 65 - 99 mg/dL  Glucose, capillary     Status: Abnormal   Collection Time: 11/08/16 12:04 PM  Result Value Ref Range   Glucose-Capillary 125 (H) 65 - 99 mg/dL  Glucose, capillary     Status: Abnormal   Collection Time: 11/08/16  4:40 PM  Result Value Ref Range   Glucose-Capillary 112 (H) 65 -  99 mg/dL  Glucose, capillary     Status: Abnormal   Collection Time: 11/08/16  7:12 PM  Result Value Ref Range   Glucose-Capillary 100 (H) 65 - 99 mg/dL  Blood gas, arterial     Status: Abnormal (Preliminary result)   Collection Time: 11/08/16  7:21 PM  Result Value Ref Range   FIO2 5.00    Delivery systems CONTINUOUS POSITIVE AIRWAY PRESSURE    Peep/cpap 7.0 cm H20   pH, Arterial 7.41 7.350 - 7.450   pCO2 arterial 58 (H) 32.0 - 48.0 mmHg   pO2, Arterial 81 (L) 83.0 - 108.0 mmHg   Bicarbonate 36.8 (H) 20.0 - 28.0 mmol/L   Acid-Base Excess 10.3 (H) 0.0 - 2.0 mmol/L   O2 Saturation 96.0 %   Patient temperature 37.0    Collection site RIGHT RADIAL    Sample type PENDING    Allens test (pass/fail) ARTERIAL DRAW (A) PASS  Glucose, capillary     Status: Abnormal   Collection Time: 11/08/16  8:57 PM  Result Value Ref Range   Glucose-Capillary 114 (H) 65 - 99 mg/dL  Basic metabolic panel     Status: Abnormal   Collection Time: 11/09/16  5:21 AM  Result Value Ref Range   Sodium 137 135 - 145 mmol/L   Potassium 3.4 (L) 3.5 - 5.1 mmol/L   Chloride 97 (L) 101 - 111 mmol/L   CO2 30 22 - 32 mmol/L   Glucose, Bld 111 (H) 65 - 99 mg/dL   BUN 21 (H) 6 - 20 mg/dL   Creatinine, Ser 1.02 0.61 - 1.24 mg/dL   Calcium 9.1 8.9 - 10.3 mg/dL   GFR calc non Af Amer >60 >60 mL/min   GFR calc Af Amer >60 >60 mL/min    Comment: (NOTE) The eGFR has been calculated using the CKD EPI equation. This calculation has not been validated in all clinical  situations. eGFR's persistently <60 mL/min signify possible Chronic Kidney Disease.    Anion gap 10 5 - 15  Glucose, capillary     Status: Abnormal   Collection Time: 11/09/16  7:23 AM  Result Value Ref Range   Glucose-Capillary 109 (H) 65 - 99 mg/dL  Glucose, capillary     Status: Abnormal   Collection Time: 11/09/16  8:02 AM  Result Value Ref Range   Glucose-Capillary 110 (H) 65 - 99 mg/dL  Glucose, capillary     Status: Abnormal   Collection Time: 11/09/16 11:15 AM  Result Value Ref Range   Glucose-Capillary 189 (H) 65 - 99 mg/dL  Glucose, capillary     Status: None   Collection Time: 11/09/16  3:57 PM  Result Value Ref Range   Glucose-Capillary 98 65 - 99 mg/dL  Glucose, capillary     Status: None   Collection Time: 11/09/16  5:11 PM  Result Value Ref Range   Glucose-Capillary 99 65 - 99 mg/dL    Current Facility-Administered Medications  Medication Dose Route Frequency Provider Last Rate Last Dose  . acetaminophen (TYLENOL) tablet 650 mg  650 mg Oral Q6H PRN Lance Coon, MD   650 mg at 11/02/16 2316   Or  . acetaminophen (TYLENOL) suppository 650 mg  650 mg Rectal Q6H PRN Lance Coon, MD   650 mg at 11/09/16 0044  . asenapine (SAPHRIS) sublingual tablet 5 mg  5 mg Sublingual BID Nellie Pester T, MD      . atenolol (TENORMIN) tablet 50 mg  50 mg Oral Daily Gouru, Illene Silver, MD  50 mg at 11/08/16 1115  . atorvastatin (LIPITOR) tablet 10 mg  10 mg Oral Daily Lance Coon, MD   10 mg at 11/08/16 1115  . divalproex (DEPAKOTE ER) 24 hr tablet 2,000 mg  2,000 mg Oral QHS Vaughan Basta, MD   2,000 mg at 11/06/16 2120  . guaiFENesin-dextromethorphan (ROBITUSSIN DM) 100-10 MG/5ML syrup 5 mL  5 mL Oral Q4H PRN Vaughan Basta, MD   5 mL at 11/07/16 0119  . heparin injection 5,000 Units  5,000 Units Subcutaneous Driscilla Moats, MD   5,000 Units at 11/09/16 1508  . ibuprofen (ADVIL,MOTRIN) tablet 600 mg  600 mg Oral Q6H PRN Flora Lipps, MD      . insulin aspart  (novoLOG) injection 0-5 Units  0-5 Units Subcutaneous QHS Vaughan Basta, MD      . insulin aspart (novoLOG) injection 0-9 Units  0-9 Units Subcutaneous TID WC Vaughan Basta, MD   2 Units at 11/09/16 1152  . ipratropium-albuterol (DUONEB) 0.5-2.5 (3) MG/3ML nebulizer solution 3 mL  3 mL Nebulization Q6H Gouru, Aruna, MD   3 mL at 11/09/16 1410  . latanoprost (XALATAN) 0.005 % ophthalmic solution 1 drop  1 drop Both Eyes QHS Lance Coon, MD   1 drop at 11/07/16 2215  . levothyroxine (SYNTHROID, LEVOTHROID) tablet 75 mcg  75 mcg Oral QAC breakfast Lance Coon, MD   75 mcg at 11/08/16 0757  . MEDLINE mouth rinse  15 mL Mouth Rinse BID Lance Coon, MD   15 mL at 11/09/16 1145  . ondansetron (ZOFRAN) tablet 4 mg  4 mg Oral Q6H PRN Lance Coon, MD       Or  . ondansetron [facility]) injection 4 mg  4 mg Intravenous Q6H PRN Lance Coon, MD      . pantoprazole (PROTONIX) EC tablet 40 mg  40 mg Oral Daily Lance Coon, MD   40 mg at 11/08/16 1115    Musculoskeletal: Strength & Muscle Tone: within normal limits Gait & Station: unable to stand Patient leans: N/A  Psychiatric Specialty Exam: Physical Exam  Nursing note and vitals reviewed. Constitutional: He appears well-developed and well-nourished.  HENT:  Head: Normocephalic and atraumatic.  Eyes: Conjunctivae are normal. Pupils are equal, round, and reactive to light.  Neck: Normal range of motion.  Cardiovascular: Normal heart sounds.  Respiratory: Effort normal.  GI: Soft.    Musculoskeletal: Normal range of motion.  Neurological: He is alert.  Skin: Skin is warm and dry.  Psychiatric: He has a normal mood and affect. Judgment normal. His speech is delayed. He is slowed. He expresses no homicidal and no suicidal ideation. He exhibits abnormal recent memory.    Review of Systems  Constitutional: Negative.   HENT: Negative.   Eyes: Negative.   Respiratory: Negative.   Cardiovascular: Negative.    Gastrointestinal: Negative.   Musculoskeletal: Negative.   Skin: Negative.   Neurological: Negative.   Psychiatric/Behavioral: Negative for depression, hallucinations, memory loss, substance abuse and suicidal ideas. The patient is not nervous/anxious and does not have insomnia.     Blood pressure 121/84, pulse (!) 113, temperature 99.6 F (37.6 C), temperature source Oral, resp. rate (!) 23, height _0  (1.88 m), weight (!) 146.1 kg (322 lb 1.5 oz), SpO2 96 %.Body mass index is 41.35 kg/m.  General Appearance: Casual  Eye Contact:  Good  Speech:  Slow  Volume:  Decreased  Mood:  Euthymic  Affect:  Constricted  Thought Process:  Disorganized  Orientation:  Negative  Thought Content:  Illogical  Suicidal Thoughts:  No  Homicidal Thoughts:  No  Memory:  Immediate;   Fair Recent;   Fair Remote;   Fair  Judgement:  Fair  Insight:  Shallow  Psychomotor Activity:  Decreased  Concentration:  Concentration: Fair  Recall:  [employer] of Knowledge:  Fair  Language:  Fair  Akathisia:  No  Handed:  Right  AIMS (if indicated):     Assets:  Financial Resources/Insurance Housing Social Support  ADL's:  Impaired  Cognition:  Impaired,  Mild  Sleep:        Treatment Plan Summary: Daily contact with patient to assess and evaluate symptoms and progress in treatment, Medication management and Plan Patient is certainly much more awake today which is good news.  Medically looking better.  Not manic not agitated and not bizarre but I am concerned now that he is awake about the possibility of worsening psychotic symptoms.  I would like to go ahead and restart some antipsychotic medicine.  Saphris is a good choice for a patient in this condition as it is sublingual and does not need to be swallowed at all but also does not require a needle.  Orders done for 5 mg Saphris twice daily starting tonight.  Reviewed this with nurses in the ICU.  I will sign out to the psychiatrist over the weekend and  continue to follow up.  Disposition: Patient does not meet criteria for psychiatric inpatient admission. Supportive therapy provided about ongoing stressors.  Alethia Berthold, MD 11/09/2016 7:23 PM

## 2016-11-09 NOTE — Evaluation (Signed)
Clinical/Bedside Swallow Evaluation Patient Details  Name: John [name]. MRN: [id number] Date of Birth: 1975-03-04  Today's Date: 11/09/2016 Time: SLP Start Time (ACUTE ONLY): 62 SLP Stop Time (ACUTE ONLY): 1005 SLP Time Calculation (min) (ACUTE ONLY): 50 min  Past Medical History:  Past Medical History:  Diagnosis Date  . Anemia   . Asthma   . Diabetes mellitus   . Morbid obesity ([place])   . Paranoid schizophrenia ([place])   . Personality disorder ([place])   . Sleep apnea   . Thyroid disease    Past Surgical History:  Past Surgical History:  Procedure Laterality Date  . FRACTURE SURGERY     HPI:  John [name] is a 41 yo male admitted 11/01/16 with acute encephalopathy of unknown etiology, acute renal failure, and possible pneumonia.  He was transferred to the stepdown unit 11/5 due to worsening encephalopathy likely secondary to hypercapnia requiring Bipap.  Assessment includes: Respiratory distress from polysubstance abuse with multiple psychiatric drugs, acute encephalopathy secondary to hypercapnia, and acute renal failure.  PMH includes OSA, asthma, personality disorder, paranoid schizophrenia, Obesity, and DM. John [name] has fluctuating alertness and requires BiPAP at times per NSG. When awake, his is verbally conversive w/ NSG. This morning he is conversive w/ no effortful respiratory status noted; some decline in Cognitive function which NSG reported was baseline for John [name]. John [name] denied any swallowing difficulties at the [place] stating he ate what he wanted.    Assessment / Plan / Recommendation Clinical Impression  John [name] appears to present w/ fairly adequate oropharyngeal phase swallow function for John [name]'s baseline Cognitive/intellectual status c/b only min increased oral phase time w/ increased texture - John [name] tended to demonstrate min increased oral phase time w/ increased textures (foods) also c/b rolling chewing behavior. No overt s/s of aspiration were noted w/ the po trials at breakfast meal including  ~10 ozs of thin liquids via cup and straw and purees/soft solids; no decline in vocal quality or respiratory status during trials (O2 sats 98%). John [name] tended to be somewhat impulsive w/ the bolus volume he put in his mouth and the multiple sips of liquids he took. SLP provided monitoring of bolus intake by cueing to slow down, take smaller bites/sips, and pinching straw to limit consecutive sipping. John [name] participated in helping to feed self but needed support and monitoring d/t Cognition. Of note, at end of po trials as John [name] stated he felt his stomach was "tight" and immediately following his statement, John [name] exhibited dry heaving behavior then coughing - suspect John [name] may have had some regurgitation of material w/in the Esophagus which impacted his laryngeal-pharyngeal area resulting in overt coughing. John [name] stated this happens "a lot". NSG informed and endorsed possible GI issues which he will address w/ MD at rounds. Due to John [name]'s swallowing presentation and apparent toleration when following general aspiration precautions, recommend John [name] have a mech soft diet w/ thin liquids w/ aspiration precautions, monitoring at meals, Pills in puree for easier, safer swallowing. John [name] will require monitoring w/ verbal cues to direct him to use small, single sips and bites more slowly and to clear mouth fully b/f putting more food in his mouth. Recommend John [name] f/u w/ GI for further Esophageal motility assessment as any dysmotility or regurgitation of Reflux material can result in aspiration of Reflux and impact a John [name]'s Pulmonary status.  SLP Visit Diagnosis: Dysphagia, unspecified (R13.10)    Aspiration Risk  (reduced from an oropharyngeal phase standpoint)    Diet Recommendation  Mech Soft (dysphagia level 3) w/  Thin liquids; general aspiration precautions; monitoring at meals for cueing to address any impulsive feeding behaviors, clearing mouth b/t bites, and slowing down when eating/drinking.  Medication Administration: Whole meds with puree     Other  Recommendations Recommended Consults: (Dietician f/u) Oral Care Recommendations: Oral care BID;Patient independent with oral care;Staff/trained caregiver to provide oral care Other Recommendations: (n/a)   Follow up Recommendations None      Frequency and Duration (n/a)          Prognosis Prognosis for Safe Diet Advancement: Good Barriers to Reach Goals: Cognitive deficits;Behavior(baseline)      Swallow Study   General Date of Onset: 11/01/16 HPI: John [name] is a 41 yo male admitted 11/01/16 with acute encephalopathy of unknown etiology, acute renal failure, and possible pneumonia.  He was transferred to the stepdown unit 11/5 due to worsening encephalopathy likely secondary to hypercapnia requiring Bipap.  Assessment includes: Respiratory distress from polysubstance abuse with multiple psychiatric drugs, acute encephalopathy secondary to hypercapnia, and acute renal failure.  PMH includes OSA, asthma, personality disorder, paranoid schizophrenia, Obesity, and DM. John [name] has fluctuating alertness and requires BiPAP at times per NSG. When awake, his is verbally conversive w/ NSG. This morning he is conversive w/ no effortful respiratory status noted; some decline in Cognitive function which NSG reported was baseline for John [name]. John [name] denied any swallowing difficulties at the [place] stating he ate what he wanted.  Type of Study: Bedside Swallow Evaluation Previous Swallow Assessment: none reported Diet Prior to this Study: Regular;Thin liquids Temperature Spikes Noted: No(wbc 10.5 (temp increased during the night)) Respiratory Status: Nasal cannula(2 liters) History of Recent Intubation: No Behavior/Cognition: Alert;Cooperative;Pleasant mood;Distractible;Requires cueing(declined Cognitive/Intellectual status baseline per report) Oral Cavity Assessment: Within Functional Limits(min increased saliva anteriorly) Oral Care Completed by SLP: Recent completion by staff Oral Cavity - Dentition:  Adequate natural dentition Vision: Functional for self-feeding Self-Feeding Abilities: Able to feed self;Needs assist;Needs set up(somewhat impulsive when feeding self) Patient Positioning: Upright in bed Baseline Vocal Quality: Normal Volitional Cough: Strong Volitional Swallow: Able to elicit    Oral/Motor/Sensory Function Overall Oral Motor/Sensory Function: Within functional limits   Ice Chips Ice chips: Within functional limits Presentation: Spoon(fed; 2 trials)   Thin Liquid Thin Liquid: Within functional limits Presentation: Cup;Self Fed;Straw(carefully monitored volume; ~10 ozs total ) Other Comments: John [name] was impulsive during self feeding and required monitoring when drinking thin liquids    Nectar Thick Nectar Thick Liquid: Not tested   Honey Thick Honey Thick Liquid: Not tested   Puree Puree: Impaired Presentation: Spoon;Self Fed(assisted; 6 trials) Oral Phase Impairments: (min increased oral phase time) Oral Phase Functional Implications: Prolonged oral transit(min increased oral phase time; chewing behavior) Pharyngeal Phase Impairments: (none)   Solid   GO   Solid: Impaired Presentation: Self Fed;Spoon(6 trials of mech soft foods) Oral Phase Impairments: (increased oral phase time) Oral Phase Functional Implications: (min increased oral phase time; chewing behavior) Pharyngeal Phase Impairments: (none) Other Comments: John [name] was somewhat impulsive w/ the bolus volume he put in his mouth    Functional Assessment Tool Used: clinical judgement Functional Limitations: Swallowing Swallow Current Status (H3716): At least 1 percent but less than 20 percent impaired, limited or restricted Swallow Goal Status [phone]): At least 1 percent but less than 20 percent impaired, limited or restricted Swallow Discharge Status [phone]): At least 1 percent but less than 20 percent impaired, limited or restricted   Orinda Kenner, MS, CCC-SLP Maddy[facility]ie 11/09/2016,2:05 PM

## 2016-11-09 NOTE — Progress Notes (Signed)
MEDICATION RELATED CONSULT NOTE - ELECTROLYTES  Pharmacy Consult for electrolytes Indication: hypokalemia  Labs: Sodium (mmol/L)  Date Value  11/09/2016 137  04/09/2014 138   Potassium (mmol/L)  Date Value  11/09/2016 3.4 (L)  04/09/2014 4.3   Magnesium (mg/dL)  Date Value  12/21/2015 1.9   Phosphorus (mg/dL)  Date Value  12/21/2015 4.6   Calcium (mg/dL)  Date Value  11/09/2016 9.1   Calcium, Total (mg/dL)  Date Value  04/09/2014 8.4 (L)   Albumin (g/dL)  Date Value  11/01/2016 3.7  04/09/2014 3.8    Estimated Creatinine Clearance: 148.3 mL/min (by C-G formula based on SCr of 1.02 mg/dL).   Assessment: 41 yo male admitted with AMS. Pharmacy has been consulted to monitor and replace electrolytes.   Goal of Therapy:  K = 3.5 - 5  Plan:  K = 3.4, Will replace with potassium 50mEQ x once and recheck with AM labs. Pharmacy will continue to monitor and adjust as needed.   Lendon Ka, PharmD Pharmacy Resident 11/09/2016,3:45 PM

## 2016-11-10 DIAGNOSIS — G4733 Obstructive sleep apnea (adult) (pediatric): Secondary | ICD-10-CM

## 2016-11-10 LAB — BASIC METABOLIC PANEL
Anion gap: 10 (ref 5–15)
BUN: 19 mg/dL (ref 6–20)
CHLORIDE: 97 mmol/L — AB (ref 101–111)
CO2: 30 mmol/L (ref 22–32)
Calcium: 9.1 mg/dL (ref 8.9–10.3)
Creatinine, Ser: 0.86 mg/dL (ref 0.61–1.24)
GFR calc non Af Amer: >60 mL/min (ref >60)
Glucose, Bld: 119 mg/dL — ABNORMAL HIGH (ref 65–99)
POTASSIUM: 3.4 mmol/L — AB (ref 3.5–5.1)
SODIUM: 137 mmol/L (ref 135–145)

## 2016-11-10 LAB — GLUCOSE, CAPILLARY
GLUCOSE-CAPILLARY: 107 mg/dL — AB (ref 65–99)
GLUCOSE-CAPILLARY: 111 mg/dL — AB (ref 65–99)
GLUCOSE-CAPILLARY: 116 mg/dL — AB (ref 65–99)
GLUCOSE-CAPILLARY: 105 mg/dL — AB (ref 65–99)
Glucose-Capillary: 113 mg/dL — ABNORMAL HIGH (ref 65–99)

## 2016-11-10 LAB — MAGNESIUM: MAGNESIUM: 1.8 mg/dL (ref 1.7–2.4)

## 2016-11-10 MED ORDER — POTASSIUM CHLORIDE 20 MEQ PO PACK
40.0000 meq | PACK | Freq: Once | ORAL | Status: AC
  Administered 2016-11-10: 40 meq via ORAL
  Filled 2016-11-10: qty 2

## 2016-11-10 MED ORDER — PANTOPRAZOLE SODIUM 40 MG PO PACK
40.0000 mg | PACK | Freq: Every day | ORAL | Status: DC
  Administered 2016-11-10: 40 mg
  Filled 2016-11-10 (×2): qty 20

## 2016-11-10 MED ORDER — BISACODYL 10 MG RE SUPP
10.0000 mg | Freq: Every day | RECTAL | Status: DC | PRN
  Administered 2016-11-10: 10 mg via RECTAL
  Filled 2016-11-10: qty 1

## 2016-11-10 NOTE — Consult Note (Signed)
   Name: John [name]. MRN: [id number] DOB: August 13, 1975    ADMISSION DATE:  11/01/2016 CONSULTATION DATE: 11/05/2016  REFERRING MD : Dr. Bridgett Larsson   CHIEF COMPLAINT: Altered mental status    BRIEF PATIENT DESCRIPTION:  41 yo male admitted 11/1 with acute encephalopathy of unknown etiology, acute renal failure, and possible pneumonia.  He was transferred to the stepdown unit 11/5 due to worsening encephalopathy likely secondary to hypercapnia requiring [place]  11/1-Pt admitted to [facility] unit 11/5-Pt transferred to stepdown unit due to increased lethargy requiring Bipap  11/6 off biPAP  STUDIES:  CT Head 11/1>>Negative  Echo 11/2>>EF 50%   HISTORY OF PRESENT ILLNESS:   Alert and awake Follows commands Off of biPAP NG placed for abd distention possible ileus   REVIEW OF SYSTEMS:   ALL other ROS negative   VITAL SIGNS: Temp:  [98.5 F (36.9 C)-102 F (38.9 C)] 99.5 F (37.5 C) (11/10 0500) Pulse Rate:  [92-115] 113 (11/10 0600) Resp:  [14-23] 23 (11/10 0600) BP: (118-147)/(76-103) 146/84 (11/10 0600) SpO2:  [93 %-100 %] 98 % (11/10 0824) FiO2 (%):  [30 %] 30 % (11/10 0400) Weight:  [320 lb 1.7 oz (145.2 kg)-322 lb 1.5 oz (146.1 kg)] 320 lb 1.7 oz (145.2 kg) (11/10 0500)  PHYSICAL EXAMINATION: General: well developed, well nourished male, NAD  Neuro: lethargic, follows commands, PERRL HEENT: supple, no JVD Cardiovascular: nsr, s1s2, no M/R/G Lungs: faint expiratory wheezes throughout, even, non labored on Bipap Abdomen: +BS x4, obese, soft, non tender, non distended  Musculoskeletal: normal bulk and tone, no edema  Skin: intact no rashes or lesions   Recent Labs  Lab 11/08/16 0501 11/09/16 0521 11/10/16 0429  NA 136 137 137  K 3.2* 3.4* 3.4*  CL 95* 97* 97*  CO2 31 30 30   BUN 17 21* 19  CREATININE 1.07 1.02 0.86  GLUCOSE 118* 111* 119*   Recent Labs  Lab 11/04/16 0410 11/06/16 0741 11/08/16 0501  HGB 11.1* 10.8* 11.4*  HCT 34.7*  33.4* 34.8*  WBC 5.4 6.0 10.5  PLT 158 204 221    ASSESSMENT / PLAN: 41 yo morbidly obese AAM with resp distress from polysubstance abuse with multiple phyciatric drugs in setting of OHS/OSA with underlying ileus   Acute encephalopathy secondary to hypercapnia-resolving Acute renal failure-resolved Hx: OSA, Asthma, Personality Disorder, Paranoid Schizophrenia, and Diabetes Mellitus  Maintain O2 sats >92% Continue bronchodilator therapy  Replace electrolytes as indicated  Monitor UOP Continuous telemetry monitoring  Continue psych medications-per psych consult SSI and CBG's ac/hs NG placed for decompression   Tijuan Dantes Patricia Pesa, M.D.  [facility]  Medical Director [place] Director [place] Department

## 2016-11-10 NOTE — Progress Notes (Signed)
[place] [facility] [place] NAME: John [name]    MR#:  [id number]  DATE OF BIRTH:  Jul 17, 1975  SUBJECTIVE:  CHIEF COMPLAINT:   Chief Complaint  Patient presents with  . Altered Mental Status   Patient was transferred to the floor and had to return back to ICU 11/5 due to decreasing responsiveness 11/10 - currently off BiPAP but abdomen is distended has NG tube Had 1 bowel movement today a.m. more responsive to verbal commands, answering appropriately REVIEW OF SYSTEMS:    CONSTITUTIONAL: No fever, fatigue or weakness.   RESPIRATORY: No cough, improving shortness of breath, no wheezing or hemoptysis.  CARDIOVASCULAR: No chest pain, orthopnea, edema.  GASTROINTESTINAL: No nausea, vomiting, diarrhea or abdominal pain.  GENITOURINARY: No dysuria, hematuria.  ENDOCRINE: No polyuria, nocturia,  HEMATOLOGY: No anemia, easy bruising or bleeding SKIN: No rash or lesion. MUSCULOSKELETAL: No joint pain or arthritis.   NEUROLOGIC: No tingling, numbness, weakness.  PSYCHIATRY: No anxiety or depression.      DRUG ALLERGIES:  No Known Allergies  VITALS:  Blood pressure 118/86, pulse 92, temperature 99 F (37.2 C), temperature source Oral, resp. rate 20, height 6\' 2"  (1.88 m), weight (!) 145.2 kg (320 lb 1.7 oz), SpO2 96 %.  PHYSICAL EXAMINATION:  GENERAL:  41 y.o.-year-old patient lying in the bed with no acute distress.  EYES: Pupils equal, round, reactive to light and accommodation. No scleral icterus.  HEENT: Head atraumatic, normocephalic. Oropharynx and nasopharynx clear.  NECK:  Supple, no jugular venous distention. No thyroid enlargement, no tenderness.  LUNGS: mod breath sounds bilaterally, no wheezing, have b/l crepitation. No use of accessory muscles of respiration.  On BiPAP during night CARDIOVASCULAR: S1, S2 normal. No murmurs, rubs, or gallops.  ABDOMEN: Soft, nontender, distended. Bowel sounds present.  EXTREMITIES: No pedal edema,  cyanosis, or clubbing.  NEUROLOGIC: Awake and alert oriented x2-3 PSYCHIATRIC: Awake and alert oriented x2-3 SKIN: No obvious rash, lesion, or ulcer.   Physical Exam LABORATORY PANEL:   CBC Recent Labs  Lab 11/08/16 0501  WBC 10.5  HGB 11.4*  HCT 34.8*  PLT 221   ------------------------------------------------------------------------------------------------------------------  Chemistries  Recent Labs  Lab 11/10/16 0429  NA 137  K 3.4*  CL 97*  CO2 30  GLUCOSE 119*  BUN 19  CREATININE 0.86  CALCIUM 9.1  MG 1.8   ------------------------------------------------------------------------------------------------------------------  Cardiac Enzymes No results for input(s): TROPONINI in the last 168 hours. ------------------------------------------------------------------------------------------------------------------  RADIOLOGY:  Dg Abd Portable 1v  Result Date: 11/09/2016 CLINICAL DATA:  Nasogastric tube placement. EXAM: PORTABLE ABDOMEN - 1 VIEW COMPARISON:  Chest x-ray 11/06/2016 FINDINGS: Nasogastric tube is in place, tip overlying the level of the stomach. There is gaseous distension of numerous bowel loops in the upper abdomen. No evidence for free intraperitoneal air. IMPRESSION: Nasogastric tube placement, tip to the stomach. Distended bowel loops in the upper abdomen. Bowel obstruction is not excluded. Consider dedicated views of the abdomen if the patient is asymptomatic. Electronically Signed   By: Nolon Nations M.D.   On: 11/09/2016 12:25    ASSESSMENT AND PLAN:    *Acute hypoxic respiratory failure with CO2 narcosis Likely better  BiPAP as needed and nightly Intensivist following the patient Discussed with Dr. Mortimer Fries he seems to feel that this may be related to his psychiatric medications  * Sepsis ([place]) -  Pneumonia with parapneumonic effusion Sputum culture is multiple bacteria consistent with respiratory flora Finished course of antibiotics, febrile   today  *Abdominal  distention possible ileus NG tube for decompression, had a bowel movement today  *Hypokalemia replace potassium  *Schizophrenia management per psychiatry  *  AKI (acute kidney injury) ([place]) -dehydration Resolved,     *  Diabetes mellitus due to underlying condition without complication, without long-term current use of insulin ([name]) -sliding scale insulin with corresponding glucose checks   HBA1C is 5.7  *  Essential hypertension -continue atenolol at 50 mg,   *  OSA (obstructive sleep apnea) -BiPAP nightly  *  Hypothyroidism -home dose thyroid replacement   Physical therapy consult pending   All the records are reviewed and case discussed with Care Management/Social Workerr. Management plans discussed with the RN and intensivist  CODE STATUS: Full.  TOTAL TIME TAKING CARE OF THIS PATIENT: 35 minutes.     POSSIBLE D/C IN 1 DAYS, DEPENDING ON CLINICAL CONDITION.   Nicholes Mango M.D on 11/10/2016   Between 7am to 6pm - Pager - [phone]  After 6pm go to [email or web address] - password [email or web address] [facility]  Office  [phone]  CC: Primary care physician; Cletis Athens, MD  Note: This dictation was prepared with Dragon dictation along with smaller phrase technology. Any transcriptional errors that result from this process are unintentional.

## 2016-11-10 NOTE — Progress Notes (Signed)
MEDICATION RELATED CONSULT NOTE - ELECTROLYTES  Pharmacy Consult for electrolytes Indication: hypokalemia  Labs: Sodium (mmol/L)  Date Value  11/10/2016 137  04/09/2014 138   Potassium (mmol/L)  Date Value  11/10/2016 3.4 (L)  04/09/2014 4.3   Magnesium (mg/dL)  Date Value  11/10/2016 1.8   Phosphorus (mg/dL)  Date Value  12/21/2015 4.6   Calcium (mg/dL)  Date Value  11/10/2016 9.1   Calcium, Total (mg/dL)  Date Value  04/09/2014 8.4 (L)   Albumin (g/dL)  Date Value  11/01/2016 3.7  04/09/2014 3.8    Estimated Creatinine Clearance: 175.2 mL/min (by C-G formula based on SCr of 0.86 mg/dL).   Assessment: 41 yo male admitted with AMS. Pharmacy has been consulted to monitor and replace electrolytes.   Goal of Therapy:  [name] = 3.5 - 5  Plan:  [name] = 3.4, Will replace with potassium 48mEQ x once and recheck with AM labs. Pharmacy will continue to monitor and adjust as needed.   John [name], John [name] 11/10/2016,8:15 AM

## 2016-11-10 NOTE — Consult Note (Signed)
[facility] Face-to-Face Psychiatry Consult   Reason for Consult: Consult for 41 year old man follow-up in the intensive care unit.  He was transferred back to the ICU last night because of decreased responsiveness.  On interview today I found the patient awake alert and more responsive than I have at any time since I have seen him. Referring Physician: Posey Pronto Patient Identification: John [name]. MRN:  [id number] Principal Diagnosis: Paranoid schizophrenia, chronic condition ([place]) Diagnosis:   Patient Active Problem List   Diagnosis Date Noted  . Encephalopathy acute [G93.40]   . Somnolence [R40.0] 11/01/2016  . AKI (acute kidney injury) ([place]) [N17.9] 11/01/2016  . Sepsis ([place]) [A41.9] 04/27/2016  . Respiratory failure ([place]) [J96.90] 12/20/2015  . Cardiomyopathy ([place]) [I42.9] 11/28/2015  . Tobacco abuse [Z72.0] 11/28/2015  . Acute respiratory failure with hypercapnia ([name]) [J96.02]   . OSA (obstructive sleep apnea) [G47.33]   . Essential hypertension [I10] 11/25/2015  . Hypothyroidism [E03.9] 11/25/2015  . Paranoid schizophrenia, chronic condition ([place]) [F20.0] 11/25/2015  . Acute respiratory failure ([place]) [J96.00]   . Acute exacerbation of COPD with asthma ([place]) [J44.1, J45.901]   . Dyslipidemia associated with type 2 diabetes mellitus ([place]) [E11.69, E78.5]   . Diabetes mellitus due to underlying condition without complication, without long-term current use of insulin ([name]) [E08.9]   . Morbid obesity ([place]) [E66.01] 09/29/2008    Total Time spent with patient: 30 minutes  Subjective:   Jovante Hammitt. is a 41 y.o. male patient admitted with patient does not have much insight into why he was admitted.Marland Kitchen  HPI: 41 year old man with schizoaffective disorder and developmental disability who is in the hospital with an extended sickness with multiple episodes of unresponsiveness respiratory distress.   Today, the patient and oriented to time, place, and situation. He was answering questions  in a linear, logical and goal directed fashion. He denied any current severe depressive symptoms, suicidal thoughts or psychosis. The patient denies any auditory or visual hallucinations. No paranoid thoughts or delusions. Per nursing, he did make some bizarre statements earlier that were out of context. No violent or threatening behavior and he has been compliant with psychotropic medications. The patient is currently on an NG tub  Past Psychiatric History: Long-standing mental health problems.  Past history of serious episodes of psychosis more recently well controlled on clozapine and lithium and Depakote although the side effects may have been worsening some of his medical problems  Risk to Self: Is patient at risk for suicide?: No Risk to Others:  Not currently Prior Inpatient Therapy:  Yes Prior Outpatient Therapy:  Yes  Past Medical History:  Past Medical History:  Diagnosis Date  . Anemia   . Asthma   . Diabetes mellitus   . Morbid obesity ([place])   . Paranoid schizophrenia ([place])   . Personality disorder ([place])   . Sleep apnea   . Thyroid disease     Past Surgical History:  Procedure Laterality Date  . FRACTURE SURGERY     Family History:  Family History  Problem Relation Age of Onset  . Glaucoma Mother    Family Psychiatric  History: None known Social History:  Social History   Substance and Sexual Activity  Alcohol Use Yes   Comment: only once in a while     Social History   Substance and Sexual Activity  Drug Use No    Social History   Socioeconomic History  . Marital status: Single    Spouse name: None  . Number of  children: None  . Years of education: None  . Highest education level: None  Social Needs  . Financial resource strain: None  . Food insecurity - worry: None  . Food insecurity - inability: None  . Transportation needs - medical: None  . Transportation needs - non-medical: None  Occupational History  . None  Tobacco Use  . Smoking  status: Former Research scientist (life sciences)  . Smokeless tobacco: Never Used  Substance and Sexual Activity  . Alcohol use: Yes    Comment: only once in a while  . Drug use: No  . Sexual activity: None  Other Topics Concern  . None  Social History Narrative  . None   Additional Social History:    Allergies:  No Known Allergies  Labs:  Results for orders placed or performed during the hospital encounter of 11/01/16 (from the past 48 hour(s))  Glucose, capillary     Status: Abnormal   Collection Time: 11/08/16  7:12 PM  Result Value Ref Range   Glucose-Capillary 100 (H) 65 - 99 mg/dL  Blood gas, arterial     Status: Abnormal (Preliminary result)   Collection Time: 11/08/16  7:21 PM  Result Value Ref Range   FIO2 5.00    Delivery systems CONTINUOUS POSITIVE AIRWAY PRESSURE    Peep/cpap 7.0 cm H20   pH, Arterial 7.41 7.350 - 7.450   pCO2 arterial 58 (H) 32.0 - 48.0 mmHg   pO2, Arterial 81 (L) 83.0 - 108.0 mmHg   Bicarbonate 36.8 (H) 20.0 - 28.0 mmol/L   Acid-Base Excess 10.3 (H) 0.0 - 2.0 mmol/L   O2 Saturation 96.0 %   Patient temperature 37.0    Collection site RIGHT RADIAL    Sample type PENDING    Allens test (pass/fail) ARTERIAL DRAW (A) PASS  Glucose, capillary     Status: Abnormal   Collection Time: 11/08/16  8:57 PM  Result Value Ref Range   Glucose-Capillary 114 (H) 65 - 99 mg/dL  Basic metabolic panel     Status: Abnormal   Collection Time: 11/09/16  5:21 AM  Result Value Ref Range   Sodium 137 135 - 145 mmol/L   Potassium 3.4 (L) 3.5 - 5.1 mmol/L   Chloride 97 (L) 101 - 111 mmol/L   CO2 30 22 - 32 mmol/L   Glucose, Bld 111 (H) 65 - 99 mg/dL   BUN 21 (H) 6 - 20 mg/dL   Creatinine, Ser 1.02 0.61 - 1.24 mg/dL   Calcium 9.1 8.9 - 10.3 mg/dL   GFR calc non Af Amer >60 >60 mL/min   GFR calc Af Amer >60 >60 mL/min    Comment: (NOTE) The eGFR has been calculated using the CKD EPI equation. This calculation has not been validated in all clinical situations. eGFR's persistently <60  mL/min signify possible Chronic Kidney Disease.    Anion gap 10 5 - 15  Glucose, capillary     Status: Abnormal   Collection Time: 11/09/16  7:23 AM  Result Value Ref Range   Glucose-Capillary 109 (H) 65 - 99 mg/dL  Glucose, capillary     Status: Abnormal   Collection Time: 11/09/16  8:02 AM  Result Value Ref Range   Glucose-Capillary 110 (H) 65 - 99 mg/dL  Glucose, capillary     Status: Abnormal   Collection Time: 11/09/16 11:15 AM  Result Value Ref Range   Glucose-Capillary 189 (H) 65 - 99 mg/dL  Glucose, capillary     Status: None   Collection Time: 11/09/16  3:57 PM  Result Value Ref Range   Glucose-Capillary 98 65 - 99 mg/dL  Glucose, capillary     Status: None   Collection Time: 11/09/16  5:11 PM  Result Value Ref Range   Glucose-Capillary 99 65 - 99 mg/dL  Glucose, capillary     Status: Abnormal   Collection Time: 11/09/16  9:50 PM  Result Value Ref Range   Glucose-Capillary 156 (H) 65 - 99 mg/dL   Comment 1 Notify RN    Comment 2 Document in Chart   Basic metabolic panel     Status: Abnormal   Collection Time: 11/10/16  4:29 AM  Result Value Ref Range   Sodium 137 135 - 145 mmol/L   Potassium 3.4 (L) 3.5 - 5.1 mmol/L   Chloride 97 (L) 101 - 111 mmol/L   CO2 30 22 - 32 mmol/L   Glucose, Bld 119 (H) 65 - 99 mg/dL   BUN 19 6 - 20 mg/dL   Creatinine, Ser 0.86 0.61 - 1.24 mg/dL   Calcium 9.1 8.9 - 10.3 mg/dL   GFR calc non Af Amer >60 >60 mL/min   GFR calc Af Amer >60 >60 mL/min    Comment: (NOTE) The eGFR has been calculated using the CKD EPI equation. This calculation has not been validated in all clinical situations. eGFR's persistently <60 mL/min signify possible Chronic Kidney Disease.    Anion gap 10 5 - 15  Magnesium     Status: None   Collection Time: 11/10/16  4:29 AM  Result Value Ref Range   Magnesium 1.8 1.7 - 2.4 mg/dL  Glucose, capillary     Status: Abnormal   Collection Time: 11/10/16  8:03 AM  Result Value Ref Range   Glucose-Capillary 113  (H) 65 - 99 mg/dL  Glucose, capillary     Status: Abnormal   Collection Time: 11/10/16 11:49 AM  Result Value Ref Range   Glucose-Capillary 107 (H) 65 - 99 mg/dL  Glucose, capillary     Status: Abnormal   Collection Time: 11/10/16  4:06 PM  Result Value Ref Range   Glucose-Capillary 111 (H) 65 - 99 mg/dL    Current Facility-Administered Medications  Medication Dose Route Frequency Provider Last Rate Last Dose  . acetaminophen (TYLENOL) tablet 650 mg  650 mg Oral Q6H PRN Lance Coon, MD   650 mg at 11/02/16 2316   Or  . acetaminophen (TYLENOL) suppository 650 mg  650 mg Rectal Q6H PRN Lance Coon, MD   650 mg at 11/10/16 0448  . asenapine (SAPHRIS) sublingual tablet 5 mg  5 mg Sublingual BID Clapacs, Madie Reno, MD   5 mg at 11/10/16 0936  . atenolol (TENORMIN) tablet 50 mg  50 mg Oral Daily Gouru, Aruna, MD   50 mg at 11/10/16 0936  . atorvastatin (LIPITOR) tablet 10 mg  10 mg Oral Daily Lance Coon, MD   10 mg at 11/10/16 0936  . bisacodyl (DULCOLAX) suppository 10 mg  10 mg Rectal Daily PRN Mikael Spray, NP   10 mg at 11/10/16 0448  . divalproex (DEPAKOTE ER) 24 hr tablet 2,000 mg  2,000 mg Oral QHS Vaughan Basta, MD   2,000 mg at 11/06/16 2120  . guaiFENesin-dextromethorphan (ROBITUSSIN DM) 100-10 MG/5ML syrup 5 mL  5 mL Oral Q4H PRN Vaughan Basta, MD   5 mL at 11/07/16 0119  . heparin injection 5,000 Units  5,000 Units Subcutaneous Driscilla Moats, MD   5,000 Units at 11/10/16 1425  . ibuprofen (ADVIL,MOTRIN)  tablet 600 mg  600 mg Oral Q6H PRN Flora Lipps, MD      . insulin aspart (novoLOG) injection 0-5 Units  0-5 Units Subcutaneous QHS Vaughan Basta, MD      . insulin aspart (novoLOG) injection 0-9 Units  0-9 Units Subcutaneous TID WC Vaughan Basta, MD   2 Units at 11/09/16 1152  . ipratropium-albuterol (DUONEB) 0.5-2.5 (3) MG/3ML nebulizer solution 3 mL  3 mL Nebulization Q6H Gouru, Aruna, MD   3 mL at 11/10/16 1400  . latanoprost  (XALATAN) 0.005 % ophthalmic solution 1 drop  1 drop Both Eyes QHS Lance Coon, MD   1 drop at 11/09/16 2214  . levothyroxine (SYNTHROID, LEVOTHROID) tablet 75 mcg  75 mcg Oral QAC breakfast Lance Coon, MD   75 mcg at 11/10/16 2993  . MEDLINE mouth rinse  15 mL Mouth Rinse BID Lance Coon, MD   15 mL at 11/10/16 0946  . ondansetron (ZOFRAN) tablet 4 mg  4 mg Oral Q6H PRN Lance Coon, MD       Or  . ondansetron [facility]) injection 4 mg  4 mg Intravenous Q6H PRN Lance Coon, MD      . pantoprazole sodium (PROTONIX) 40 mg/20 mL oral suspension 40 mg  40 mg Per Tube Daily Flora Lipps, MD   40 mg at 11/10/16 7169    Musculoskeletal: Strength & Muscle Tone: within normal limits Gait & Station: unable to stand Patient leans: N/A  Psychiatric Specialty Exam: Physical Exam  Nursing note and vitals reviewed. Eyes: Conjunctivae are normal.  GI:    Psychiatric: He has a normal mood and affect. Judgment normal. His speech is delayed. He is slowed. He expresses no homicidal and no suicidal ideation. He exhibits abnormal recent memory.    Review of Systems  Constitutional: Negative.   HENT: Negative.   Eyes: Negative.   Respiratory: Negative.   Cardiovascular: Negative.   Gastrointestinal: Negative.   Musculoskeletal: Negative.   Skin: Negative.   Neurological: Negative.   Psychiatric/Behavioral: Negative for depression, hallucinations, memory loss, substance abuse and suicidal ideas. The patient is not nervous/anxious and does not have insomnia.     Blood pressure (!) 124/91, pulse 92, temperature 98.9 F (37.2 C), temperature source Oral, resp. rate (!) 22, height 6' 2"  (1.88 m), weight (!) 145.2 kg (320 lb 1.7 oz), SpO2 97 %.Body mass index is 41.1 kg/m.  General Appearance: Casual  Eye Contact:  Good  Speech:  Slow  Volume:  Decreased  Mood:  Euthymic  Affect:  Constricted  Thought Process:  Fairly goal directed but at times will make a bizarre comment out of context    Orientation:  Negative  Thought Content:  Logical  Suicidal Thoughts:  No  Homicidal Thoughts:  No  Memory:  Immediate;   Fair Recent;   Fair Remote;   Fair  Judgement:  Fair  Insight:  Shallow  Psychomotor Activity:  Decreased  Concentration:  Concentration: Fair  Recall:  [employer] of Knowledge:  Fair  Language:  Fair  Akathisia:  No  Handed:  Right  AIMS (if indicated):     Assets:  Financial Resources/Insurance Housing Social Support  ADL's:  Impaired  Cognition:  Impaired,  Mild  Sleep:        Treatment Plan Summary: Daily contact with patient to assess and evaluate symptoms and progress in treatment, Medication management and Plan Patient is certainly much more awake today which is good news.  Medically looking better.  Not manic not  agitated and not bizarre but I am concerned now that he is awake about the possibility of worsening psychotic symptoms.  I would like to go ahead and restart some antipsychotic medicine.  Saphris is a good choice for a patient in this condition as it is sublingual and does not need to be swallowed at all but also does not require a needle.  Orders done for 5 mg Saphris twice daily starting tonight.  Reviewed this with nurses in the ICU.  I will sign out to the psychiatrist over the weekend and continue to follow up.   Schizophrenia -Continue Saphris 5m po BID and will titrate up as tolerated and needed. Currently he is calm and cooperative -When not on NG, he should be on Depakote ER [id number] nightly for mood stabilization -Avoid Clozaril as it may contribute increased salivation and risk of aspiration, pulmonary infection    Disposition: Patient does not meet criteria for psychiatric inpatient admission. Supportive therapy provided about ongoing stressors.  KAJay SchlichterMD 11/10/2016 6:41 PM

## 2016-11-11 ENCOUNTER — Inpatient Hospital Stay: Payer: 59

## 2016-11-11 LAB — GLUCOSE, CAPILLARY
GLUCOSE-CAPILLARY: 113 mg/dL — AB (ref 65–99)
Glucose-Capillary: 105 mg/dL — ABNORMAL HIGH (ref 65–99)
Glucose-Capillary: 91 mg/dL (ref 65–99)
Glucose-Capillary: 105 mg/dL — ABNORMAL HIGH (ref 65–99)
Glucose-Capillary: 104 mg/dL — ABNORMAL HIGH (ref 65–99)

## 2016-11-11 LAB — BASIC METABOLIC PANEL
Anion gap: 12 (ref 5–15)
BUN: 20 mg/dL (ref 6–20)
CHLORIDE: 99 mmol/L — AB (ref 101–111)
CO2: 29 mmol/L (ref 22–32)
Calcium: 8.9 mg/dL (ref 8.9–10.3)
Creatinine, Ser: 0.86 mg/dL (ref 0.61–1.24)
GFR calc Af Amer: >60 mL/min (ref >60)
GFR calc non Af Amer: >60 mL/min (ref >60)
Glucose, Bld: 108 mg/dL — ABNORMAL HIGH (ref 65–99)
POTASSIUM: 3.1 mmol/L — AB (ref 3.5–5.1)
SODIUM: 140 mmol/L (ref 135–145)

## 2016-11-11 LAB — PHOSPHORUS: PHOSPHORUS: 4.4 mg/dL (ref 2.5–4.6)

## 2016-11-11 LAB — MAGNESIUM: MAGNESIUM: 1.9 mg/dL (ref 1.7–2.4)

## 2016-11-11 LAB — POTASSIUM: Potassium: 3.2 mmol/L — ABNORMAL LOW (ref 3.5–5.1)

## 2016-11-11 MED ORDER — POTASSIUM CHLORIDE 20 MEQ PO PACK
40.0000 meq | PACK | Freq: Once | ORAL | Status: AC
  Administered 2016-11-11: 40 meq via ORAL
  Filled 2016-11-11: qty 2

## 2016-11-11 MED ORDER — LOSARTAN POTASSIUM 50 MG PO TABS
50.0000 mg | ORAL_TABLET | Freq: Every day | ORAL | Status: DC
  Administered 2016-11-11 – 2016-11-14 (×4): 50 mg via ORAL
  Filled 2016-11-11 (×4): qty 1

## 2016-11-11 MED ORDER — POTASSIUM CHLORIDE CRYS ER 20 MEQ PO TBCR
40.0000 meq | EXTENDED_RELEASE_TABLET | Freq: Once | ORAL | Status: AC
  Administered 2016-11-11: 40 meq via ORAL
  Filled 2016-11-11: qty 2

## 2016-11-11 NOTE — NC FL2 (Signed)
[place] LEVEL OF CARE SCREENING TOOL     IDENTIFICATION  Patient Name: John [name]. Birthdate: 11/09/1975 Sex: male Admission Date (Current Location): 11/01/2016  [place] and [place] Number:  Selena Lesser [id number] [place] and Address:  [facility], [address], [place], [place] [address]      Provider Number: [id number]  Attending Physician Name and Address:  Bettey Costa, MD  Relative Name and Phone Number:  Norlene Campbell (mother) [phone]    Current Level of Care: Hospital Recommended Level of Care: [place] Prior Approval Number:    Date Approved/Denied:   PASRR Number:  Pending  Discharge Plan: SNF    Current Diagnoses: Patient Active Problem List   Diagnosis Date Noted  . Encephalopathy acute   . Somnolence 11/01/2016  . AKI (acute kidney injury) ([place]) 11/01/2016  . Sepsis ([place]) 04/27/2016  . Respiratory failure ([place]) 12/20/2015  . Cardiomyopathy ([place]) 11/28/2015  . Tobacco abuse 11/28/2015  . Acute respiratory failure with hypercapnia ([place])   . OSA (obstructive sleep apnea)   . Essential hypertension 11/25/2015  . Hypothyroidism 11/25/2015  . Paranoid schizophrenia, chronic condition ([place]) 11/25/2015  . Acute respiratory failure ([place])   . Acute exacerbation of COPD with asthma ([place])   . Dyslipidemia associated with type 2 diabetes mellitus ([place])   . Diabetes mellitus due to underlying condition without complication, without long-term current use of insulin ([place])   . Morbid obesity ([place]) 09/29/2008    Orientation RESPIRATION BLADDER Height & Weight     Self, Time, Situation, Place  O2(2L o2 acute) Continent Weight: (!) 314 lb 2.5 oz (142.5 kg) Height:  6\' 2"  (188 cm)  BEHAVIORAL SYMPTOMS/MOOD NEUROLOGICAL BOWEL NUTRITION STATUS      Continent NG/panda  AMBULATORY STATUS COMMUNICATION OF NEEDS Skin   Extensive Assist Verbally Normal                       Personal Care  Assistance Level of Assistance  Bathing, Feeding, Dressing Bathing Assistance: Maximum assistance Feeding assistance: Independent Dressing Assistance: Maximum assistance     Functional Limitations Info  Sight, Hearing, Speech Sight Info: Adequate Hearing Info: Adequate Speech Info: Adequate    SPECIAL CARE FACTORS FREQUENCY  PT (By licensed PT)     PT Frequency: Up to 5X per day, 5 days per week              Contractures Contractures Info: Not present    Additional Factors Info  Code Status, Allergies, Psychotropic, Insulin Sliding Scale Code Status Info: Full Allergies Info: No Known Allergies Psychotropic Info: Klonopin, Depakote, Lithium Insulin Sliding Scale Info: 0-5 units Novolog QHS; 0-9 units Novolog tid with meals       Current Medications (11/11/2016):  This is the current hospital active medication list Current Facility-Administered Medications  Medication Dose Route Frequency Provider Last Rate Last Dose  . acetaminophen (TYLENOL) tablet 650 mg  650 mg Oral Q6H PRN Lance Coon, MD   650 mg at 11/02/16 2316   Or  . acetaminophen (TYLENOL) suppository 650 mg  650 mg Rectal Q6H PRN Lance Coon, MD   650 mg at 11/10/16 0448  . asenapine (SAPHRIS) sublingual tablet 5 mg  5 mg Sublingual BID Clapacs, John T, MD   5 mg at 11/11/16 1009  . atenolol (TENORMIN) tablet 50 mg  50 mg Oral Daily Gouru, Aruna, MD   50 mg at 11/11/16 1008  . atorvastatin (LIPITOR) tablet 10 mg  10 mg Oral Daily Lance Coon, MD   10 mg at 11/11/16 1009  . bisacodyl (DULCOLAX) suppository 10 mg  10 mg Rectal Daily PRN Mikael Spray, NP   10 mg at 11/10/16 0448  . divalproex (DEPAKOTE ER) 24 hr tablet 2,000 mg  2,000 mg Oral QHS Vaughan Basta, MD   2,000 mg at 11/10/16 2114  . guaiFENesin-dextromethorphan (ROBITUSSIN DM) 100-10 MG/5ML syrup 5 mL  5 mL Oral Q4H PRN Vaughan Basta, MD   5 mL at 11/07/16 0119  . heparin injection 5,000 Units  5,000 Units Subcutaneous  Driscilla Moats, MD   5,000 Units at 11/11/16 0506  . ibuprofen (ADVIL,MOTRIN) tablet 600 mg  600 mg Oral Q6H PRN Flora Lipps, MD      . insulin aspart (novoLOG) injection 0-5 Units  0-5 Units Subcutaneous QHS Vaughan Basta, MD      . insulin aspart (novoLOG) injection 0-9 Units  0-9 Units Subcutaneous TID WC Vaughan Basta, MD   2 Units at 11/09/16 1152  . ipratropium-albuterol (DUONEB) 0.5-2.5 (3) MG/3ML nebulizer solution 3 mL  3 mL Nebulization Q6H Gouru, Aruna, MD   3 mL at 11/11/16 0759  . latanoprost (XALATAN) 0.005 % ophthalmic solution 1 drop  1 drop Both Eyes QHS Lance Coon, MD   1 drop at 11/10/16 2115  . levothyroxine (SYNTHROID, LEVOTHROID) tablet 75 mcg  75 mcg Oral QAC breakfast Lance Coon, MD   75 mcg at 11/11/16 0506  . MEDLINE mouth rinse  15 mL Mouth Rinse BID Lance Coon, MD   15 mL at 11/11/16 1009  . ondansetron (ZOFRAN) tablet 4 mg  4 mg Oral Q6H PRN Lance Coon, MD       Or  . ondansetron [facility]) injection 4 mg  4 mg Intravenous Q6H PRN Lance Coon, MD      . pantoprazole sodium (PROTONIX) 40 mg/20 mL oral suspension 40 mg  40 mg Per Tube Daily Flora Lipps, MD   40 mg at 11/10/16 9924     Discharge Medications: Please see discharge summary for a list of discharge medications.  Relevant Imaging Results:  Relevant Lab Results:   Additional Information SS# [id number]  Zettie Pho, LCSW   Updated Evette Cristal, MSW, [place], [phone] 11/13/16

## 2016-11-11 NOTE — Progress Notes (Signed)
MEDICATION RELATED CONSULT NOTE - ELECTROLYTES  Pharmacy Consult for electrolytes Indication: hypokalemia  Labs: Sodium (mmol/L)  Date Value  11/11/2016 140  04/09/2014 138   Potassium (mmol/L)  Date Value  11/11/2016 3.2 (L)  04/09/2014 4.3   Magnesium (mg/dL)  Date Value  11/11/2016 1.9   Phosphorus (mg/dL)  Date Value  11/11/2016 4.4   Calcium (mg/dL)  Date Value  11/11/2016 8.9   Calcium, Total (mg/dL)  Date Value  04/09/2014 8.4 (L)   Albumin (g/dL)  Date Value  11/01/2016 3.7  04/09/2014 3.8    Estimated Creatinine Clearance: 173.4 mL/min (by C-G formula based on SCr of 0.86 mg/dL).   Assessment: 41 yo male admitted with AMS. Pharmacy has been consulted to monitor and replace electrolytes.   Goal of Therapy:  K = 3.5 - 5  Plan:  K = 3.1, Will replace with potassium 16mEQ x once and recheck tonight at 18:00.   11/11 : K @ 19:00 = 3.2 Will order KCl 40 mEq PO X 1 and recheck electrolytes on 11/12 with AM labs.   Pharmacy will continue to monitor and adjust as needed.   Warden Buffa D, RPH 11/11/2016,7:50 PM

## 2016-11-11 NOTE — Progress Notes (Signed)
Called Dr Corinna Capra cell phone and left a message re elevated blood pressure and possible need to restart patient's home antihypertensive meds

## 2016-11-11 NOTE — Consult Note (Signed)
   Name: John [name]. MRN: [id number] DOB: 1975-10-06    ADMISSION DATE:  11/01/2016 CONSULTATION DATE: 11/05/2016  REFERRING MD : Dr. Bridgett Larsson   CHIEF COMPLAINT: Altered mental status    BRIEF PATIENT DESCRIPTION:  41 yo male admitted 11/1 with acute encephalopathy of unknown etiology, acute renal failure, and possible pneumonia.  He was transferred to the stepdown unit 11/5 due to worsening encephalopathy likely secondary to hypercapnia requiring Bipap   SIGNIFICANT EVENTS  11/1-Pt admitted to [facility] unit 11/5-Pt transferred to stepdown unit due to increased lethargy requiring Bipap  11/6 off biPAP  STUDIES:  CT Head 11/1>>Negative  Echo 11/2>>EF 50%   HISTORY OF PRESENT ILLNESS:   Alert and awake Follows commands Off of biPAP NG placed for abd distention possible ileus Wants to eat   REVIEW OF SYSTEMS:   ALL other ROS negative   VITAL SIGNS: Temp:  [98.2 F (36.8 C)-99 F (37.2 C)] 98.2 F (36.8 C) (11/11 0800) Pulse Rate:  [86-110] 89 (11/11 0800) Resp:  [12-24] 15 (11/11 0800) BP: (109-140)/(74-99) 140/92 (11/11 0700) SpO2:  [87 %-100 %] 99 % (11/11 0700) Weight:  [314 lb 2.5 oz (142.5 kg)] 314 lb 2.5 oz (142.5 kg) (11/11 0500)   PHYSICAL EXAMINATION: General: well developed, well nourished male, NAD  Neuro: lethargic, follows commands, PERRL HEENT: supple, no JVD Cardiovascular: nsr, s1s2, no M/R/G Lungs: CTA b/l Abdomen: +BS x4, obese, soft, non tender, + distended  Musculoskeletal: normal bulk and tone, no edema  Skin: intact no rashes or lesions  Recent Labs  Lab 11/09/16 0521 11/10/16 0429 11/11/16 0416  NA 137 137 140  K 3.4* 3.4* 3.1*  CL 97* 97* 99*  CO2 30 30 29   BUN 21* 19 20  CREATININE 1.02 0.86 0.86  GLUCOSE 111* 119* 108*   Recent Labs  Lab 11/06/16 0741 11/08/16 0501  HGB 10.8* 11.4*  HCT 33.4* 34.8*  WBC 6.0 10.5  PLT 204 221     ASSESSMENT / PLAN: 41 yo morbidly obese AAM with resp distress from polysubstance abuse  with multiple phyciatric drugs in setting of OHS/OSA with underlying ileus   Acute encephalopathy secondary to hypercapnia-resolving Acute renal failure-resolved Hx: OSA, Asthma, Personality Disorder, Paranoid Schizophrenia, and Diabetes Mellitus  Maintain O2 sats >92% Continue bronchodilator therapy  Replace electrolytes as indicated  Monitor UOP Continuous telemetry monitoring  Continue psych medications-per psych consult SSI and CBG's ac/hs NG placed for decompression Plan for liquid diet today   Corrin Parker, M.D.  [facility]  Medical Director [place] Director [place] Department

## 2016-11-11 NOTE — Clinical Social Work Note (Signed)
CSW met with the patient and his mother at bedside to discuss changes to discharge planning due to PT's recommendation of SNF for STR. The patient and his mother are in agreement to follow this recommendation, and they named [facility] as the preference due to the proximity to his mother's home. The plan after STR is to return to the group home.   The CSW has begun the process for change in PASSAR status via [facility] MUST and has sent the H+P and 30-day note to  MUST. The PASSAR is pending. The patient cannot discharge to SNF until the PASSAR is updated. CSW will continue to follow.  Santiago Bumpers, MSW, Latanya Presser [phone]

## 2016-11-11 NOTE — Consult Note (Addendum)
[facility] Face-to-Face Psychiatry Consult   Reason for Consult: Mood Disorder  Referring Physician: Posey Pronto Patient Identification: John [name]. MRN:  [id number] Principal Diagnosis: Paranoid schizophrenia, chronic condition ([place]) Diagnosis:   Patient Active Problem List   Diagnosis Date Noted  . Encephalopathy acute [G93.40]   . Somnolence [R40.0] 11/01/2016  . AKI (acute kidney injury) ([place]) [N17.9] 11/01/2016  . Sepsis ([place]) [A41.9] 04/27/2016  . Respiratory failure ([place]) [J96.90] 12/20/2015  . Cardiomyopathy ([place]) [I42.9] 11/28/2015  . Tobacco abuse [Z72.0] 11/28/2015  . Acute respiratory failure with hypercapnia ([name]) [J96.02]   . OSA (obstructive sleep apnea) [G47.33]   . Essential hypertension [I10] 11/25/2015  . Hypothyroidism [E03.9] 11/25/2015  . Paranoid schizophrenia, chronic condition ([place]) [F20.0] 11/25/2015  . Acute respiratory failure ([place]) [J96.00]   . Acute exacerbation of COPD with asthma ([place]) [J44.1, J45.901]   . Dyslipidemia associated with type 2 diabetes mellitus ([place]) [E11.69, E78.5]   . Diabetes mellitus due to underlying condition without complication, without long-term current use of insulin ([name]) [E08.9]   . Morbid obesity ([place]) [E66.01] 09/29/2008    Total Time spent with patient: 20 minutes  Subjective:   John [name]. is a 41 y.o. male patient admitted with patient does not have much insight into why he was admitted.Marland Kitchen  HPI: 41 year old man with schizoaffective disorder and developmental disability who is in the hospital with an extended sickness with multiple episodes of unresponsiveness respiratory distress.   The patient was lethargic today but oriented to time, place, and situation. Per the hospitalist, he was making some bizarre statements earlier but has been calm and cooperative. Presidents, he was talking about basketball and then talked about going bowling.Marland Kitchen No violent or threatening behavior. He denied any current severe depressive  symptoms, suicidal thoughts or psychosis. The patient denies any auditory or visual hallucinations. No paranoid thoughts or delusions. He did agree today to go to a skilled nursing facility after discharge.  Past Psychiatric History: Long-standing mental health problems.  Past history of serious episodes of psychosis more recently well controlled on clozapine and lithium and Depakote although the side effects may have been worsening some of his medical problems  Risk to Self: Is patient at risk for suicide?: No Risk to Others:  Not currently Prior Inpatient Therapy:  Yes Prior Outpatient Therapy:  Yes  Past Medical History:  Past Medical History:  Diagnosis Date  . Anemia   . Asthma   . Diabetes mellitus   . Morbid obesity ([place])   . Paranoid schizophrenia ([place])   . Personality disorder ([place])   . Sleep apnea   . Thyroid disease     Past Surgical History:  Procedure Laterality Date  . FRACTURE SURGERY     Family History:  Family History  Problem Relation Age of Onset  . Glaucoma Mother    Family Psychiatric  History: None known Social History:  Social History   Substance and Sexual Activity  Alcohol Use Yes   Comment: only once in a while     Social History   Substance and Sexual Activity  Drug Use No    Social History   Socioeconomic History  . Marital status: Single    Spouse name: None  . Number of children: None  . Years of education: None  . Highest education level: None  Social Needs  . Financial resource strain: None  . Food insecurity - worry: None  . Food insecurity - inability: None  . Transportation needs - medical: None  .  Transportation needs - non-medical: None  Occupational History  . None  Tobacco Use  . Smoking status: Former Research scientist (life sciences)  . Smokeless tobacco: Never Used  Substance and Sexual Activity  . Alcohol use: Yes    Comment: only once in a while  . Drug use: No  . Sexual activity: None  Other Topics Concern  . None  Social History  Narrative  . None   Additional Social History:    Allergies:  No Known Allergies  Labs:  Results for orders placed or performed during the hospital encounter of 11/01/16 (from the past 48 hour(s))  Glucose, capillary     Status: Abnormal   Collection Time: 11/09/16  9:50 PM  Result Value Ref Range   Glucose-Capillary 156 (H) 65 - 99 mg/dL   Comment 1 Notify RN    Comment 2 Document in Chart   Basic metabolic panel     Status: Abnormal   Collection Time: 11/10/16  4:29 AM  Result Value Ref Range   Sodium 137 135 - 145 mmol/L   Potassium 3.4 (L) 3.5 - 5.1 mmol/L   Chloride 97 (L) 101 - 111 mmol/L   CO2 30 22 - 32 mmol/L   Glucose, Bld 119 (H) 65 - 99 mg/dL   BUN 19 6 - 20 mg/dL   Creatinine, Ser 0.86 0.61 - 1.24 mg/dL   Calcium 9.1 8.9 - 10.3 mg/dL   GFR calc non Af Amer >60 >60 mL/min   GFR calc Af Amer >60 >60 mL/min    Comment: (NOTE) The eGFR has been calculated using the CKD EPI equation. This calculation has not been validated in all clinical situations. eGFR's persistently <60 mL/min signify possible Chronic Kidney Disease.    Anion gap 10 5 - 15  Magnesium     Status: None   Collection Time: 11/10/16  4:29 AM  Result Value Ref Range   Magnesium 1.8 1.7 - 2.4 mg/dL  Glucose, capillary     Status: Abnormal   Collection Time: 11/10/16  8:03 AM  Result Value Ref Range   Glucose-Capillary 113 (H) 65 - 99 mg/dL  Glucose, capillary     Status: Abnormal   Collection Time: 11/10/16 11:49 AM  Result Value Ref Range   Glucose-Capillary 107 (H) 65 - 99 mg/dL  Glucose, capillary     Status: Abnormal   Collection Time: 11/10/16  4:06 PM  Result Value Ref Range   Glucose-Capillary 111 (H) 65 - 99 mg/dL  Glucose, capillary     Status: Abnormal   Collection Time: 11/10/16  7:46 PM  Result Value Ref Range   Glucose-Capillary 116 (H) 65 - 99 mg/dL   Comment 1 Notify RN    Comment 2 Document in Chart   Glucose, capillary     Status: Abnormal   Collection Time: 11/10/16   9:22 PM  Result Value Ref Range   Glucose-Capillary 105 (H) 65 - 99 mg/dL  Glucose, capillary     Status: Abnormal   Collection Time: 11/11/16  3:58 AM  Result Value Ref Range   Glucose-Capillary 105 (H) 65 - 99 mg/dL   Comment 1 Notify RN    Comment 2 Document in Chart   Basic metabolic panel     Status: Abnormal   Collection Time: 11/11/16  4:16 AM  Result Value Ref Range   Sodium 140 135 - 145 mmol/L   Potassium 3.1 (L) 3.5 - 5.1 mmol/L   Chloride 99 (L) 101 - 111 mmol/L   CO2  29 22 - 32 mmol/L   Glucose, Bld 108 (H) 65 - 99 mg/dL   BUN 20 6 - 20 mg/dL   Creatinine, Ser 0.86 0.61 - 1.24 mg/dL   Calcium 8.9 8.9 - 10.3 mg/dL   GFR calc non Af Amer >60 >60 mL/min   GFR calc Af Amer >60 >60 mL/min    Comment: (NOTE) The eGFR has been calculated using the CKD EPI equation. This calculation has not been validated in all clinical situations. eGFR's persistently <60 mL/min signify possible Chronic Kidney Disease.    Anion gap 12 5 - 15  Magnesium     Status: None   Collection Time: 11/11/16  4:16 AM  Result Value Ref Range   Magnesium 1.9 1.7 - 2.4 mg/dL  Phosphorus     Status: None   Collection Time: 11/11/16  4:16 AM  Result Value Ref Range   Phosphorus 4.4 2.5 - 4.6 mg/dL  Glucose, capillary     Status: Abnormal   Collection Time: 11/11/16  7:20 AM  Result Value Ref Range   Glucose-Capillary 113 (H) 65 - 99 mg/dL  Glucose, capillary     Status: None   Collection Time: 11/11/16 11:54 AM  Result Value Ref Range   Glucose-Capillary 91 65 - 99 mg/dL   Comment 1 Notify RN     Current Facility-Administered Medications  Medication Dose Route Frequency Provider Last Rate Last Dose  . acetaminophen (TYLENOL) tablet 650 mg  650 mg Oral Q6H PRN Lance Coon, MD   650 mg at 11/02/16 2316   Or  . acetaminophen (TYLENOL) suppository 650 mg  650 mg Rectal Q6H PRN Lance Coon, MD   650 mg at 11/10/16 0448  . asenapine (SAPHRIS) sublingual tablet 5 mg  5 mg Sublingual BID  Clapacs, John T, MD   5 mg at 11/11/16 1009  . atenolol (TENORMIN) tablet 50 mg  50 mg Oral Daily Gouru, Aruna, MD   50 mg at 11/11/16 1008  . atorvastatin (LIPITOR) tablet 10 mg  10 mg Oral Daily Lance Coon, MD   10 mg at 11/11/16 1009  . bisacodyl (DULCOLAX) suppository 10 mg  10 mg Rectal Daily PRN Mikael Spray, NP   10 mg at 11/10/16 0448  . divalproex (DEPAKOTE ER) 24 hr tablet 2,000 mg  2,000 mg Oral QHS Vaughan Basta, MD   2,000 mg at 11/10/16 2114  . guaiFENesin-dextromethorphan (ROBITUSSIN DM) 100-10 MG/5ML syrup 5 mL  5 mL Oral Q4H PRN Vaughan Basta, MD   5 mL at 11/07/16 0119  . heparin injection 5,000 Units  5,000 Units Subcutaneous Driscilla Moats, MD   5,000 Units at 11/11/16 1402  . ibuprofen (ADVIL,MOTRIN) tablet 600 mg  600 mg Oral Q6H PRN Flora Lipps, MD      . insulin aspart (novoLOG) injection 0-5 Units  0-5 Units Subcutaneous QHS Vaughan Basta, MD      . insulin aspart (novoLOG) injection 0-9 Units  0-9 Units Subcutaneous TID WC Vaughan Basta, MD   2 Units at 11/09/16 1152  . ipratropium-albuterol (DUONEB) 0.5-2.5 (3) MG/3ML nebulizer solution 3 mL  3 mL Nebulization Q6H Gouru, Aruna, MD   3 mL at 11/11/16 1410  . latanoprost (XALATAN) 0.005 % ophthalmic solution 1 drop  1 drop Both Eyes QHS Lance Coon, MD   1 drop at 11/10/16 2115  . levothyroxine (SYNTHROID, LEVOTHROID) tablet 75 mcg  75 mcg Oral QAC breakfast Lance Coon, MD   75 mcg at 11/11/16 0506  . losartan (  COZAAR) tablet 50 mg  50 mg Oral Daily Flora Lipps, MD      . MEDLINE mouth rinse  15 mL Mouth Rinse BID Lance Coon, MD   15 mL at 11/11/16 1009  . ondansetron (ZOFRAN) tablet 4 mg  4 mg Oral Q6H PRN Lance Coon, MD       Or  . ondansetron [facility]) injection 4 mg  4 mg Intravenous Q6H PRN Lance Coon, MD      . pantoprazole sodium (PROTONIX) 40 mg/20 mL oral suspension 40 mg  40 mg Per Tube Daily Flora Lipps, MD   40 mg at 11/10/16 0263     Musculoskeletal: Strength & Muscle Tone: within normal limits Gait & Station: unable to stand Patient leans: N/A  Psychiatric Specialty Exam: Physical Exam  Nursing note and vitals reviewed. Eyes: Conjunctivae are normal.  GI:    Psychiatric: He has a normal mood and affect. Judgment normal. His speech is delayed. He is slowed. He expresses no homicidal and no suicidal ideation. He exhibits abnormal recent memory.    Review of Systems  Constitutional: Negative.   HENT: Negative.   Eyes: Negative.   Respiratory: Negative.   Cardiovascular: Negative.   Gastrointestinal: Negative.   Musculoskeletal: Negative.   Skin: Negative.   Neurological: Negative.   Psychiatric/Behavioral: Negative for depression, hallucinations, memory loss, substance abuse and suicidal ideas. The patient is not nervous/anxious and does not have insomnia.     Blood pressure (!) 145/100, pulse 97, temperature 99.1 F (37.3 C), temperature source Axillary, resp. rate 20, height 6' 2"  (1.88 m), weight (!) 142.5 kg (314 lb 2.5 oz), SpO2 98 %.Body mass index is 40.34 kg/m.  General Appearance: Casual  Eye Contact:  Good  Speech:  Slow  Volume:  Decreased  Mood:  Euthymic  Affect:  Lethargic  Thought Process:  Fairly goal directed but at times will make a bizarre comment out of context  Orientation:  Negative  Thought Content:  Logical  Suicidal Thoughts:  No  Homicidal Thoughts:  No  Memory:  Immediate;   Fair Recent;   Fair Remote;   Fair  Judgement:  Fair  Insight:  Shallow  Psychomotor Activity:  Decreased  Concentration:  Concentration: Fair  Recall:  [employer] of Knowledge:  Fair  Language:  Fair  Akathisia:  No  Handed:  Right  AIMS (if indicated):     Assets:  Financial Resources/Insurance Housing Social Support  ADL's:  Impaired  Cognition:  Impaired,  Mild  Sleep:        Treatment Plan Summary: Daily contact with patient to assess and evaluate symptoms and progress in  treatment, Medication management and Plan Patient is certainly much more awake today which is good news.  Medically looking better.  Not manic not agitated and not bizarre but I am concerned now that he is awake about the possibility of worsening psychotic symptoms.  I would like to go ahead and restart some antipsychotic medicine.  Saphris is a good choice for a patient in this condition as it is sublingual and does not need to be swallowed at all but also does not require a needle.  Orders done for 5 mg Saphris twice daily starting tonight.  Reviewed this with nurses in the ICU.  I will sign out to the psychiatrist over the weekend and continue to follow up.   Schizophrenia -Per Dr Mortimer Fries, will not increase psychotropics as he became more hypoxic last night -Even though he is making some  bizarre statements, he has been calm and cooperative. No violent or aggressive behavior. -Continue Saphris 31m po BID and will titrate up as tolerated and needed.  -When not on NG, he should be on Depakote ER [id number] nightly for mood stabilization -Avoid Clozaril as it may contribute increased salivation and risk of aspiration, pulmonary infection    Disposition: Patient does not meet criteria for psychiatric inpatient admission. Supportive therapy provided about ongoing stressors.  KAJay SchlichterMD 11/11/2016 5:15 PM

## 2016-11-11 NOTE — Progress Notes (Addendum)
Report called to [facility] on 2A, pt will transport on 2A bed.  O2 tank to accompany.  VSS, Dr Mortimer Fries has ok'd restart losartan for him.  Temp is 99.1, lowered temp in room and removed extra blanket.  Pt and his mother agreeable to transfer.  Meds, Chart, and belongings will go with the patient

## 2016-11-11 NOTE — Progress Notes (Signed)
[facility] [place] NAME: John [name]    MR#:  [id number]  DATE OF BIRTH:  11-06-1975  SUBJECTIVE:  CHIEF COMPLAINT:   Chief Complaint  Patient presents with  . Altered Mental Status   Patient was transferred to the floor and had to return back to ICU 11/5 due to decreasing responsiveness 11/10 - currently off BiPAP but abdomen is distended has NG tube Had 1 bowel movement 11/10  more responsive to verbal commands, bizzare talks today talking about basketball, elementary school and in college, all over the place  Limited review of systems CONSTITUTIONAL: No fever, fatigue or weakness.   RESPIRATORY: No cough, improving shortness of breath, no wheezing or hemoptysis.  CARDIOVASCULAR: No chest pain, orthopnea, edema.  MUSCULOSKELETAL: No joint pain or arthritis.   NEUROLOGIC: No tingling, numbness, weakness.  PSYCHIATRY: No anxiety or depression.      DRUG ALLERGIES:  No Known Allergies  VITALS:  Blood pressure 119/81, pulse 84, temperature 98.6 F (37 C), temperature source Axillary, resp. rate 15, height 6\' 2"  (1.88 m), weight (!) 142.5 kg (314 lb 2.5 oz), SpO2 100 %.  PHYSICAL EXAMINATION:  GENERAL:  41 y.o.-year-old patient lying in the bed with no acute distress.  EYES: Pupils equal, round, reactive to light and accommodation. No scleral icterus.  HEENT: Head atraumatic, normocephalic. Oropharynx and nasopharynx clear.  NECK:  Supple, no jugular venous distention. No thyroid enlargement, no tenderness.  LUNGS: mod breath sounds bilaterally, no wheezing, have b/l crepitation. No use of accessory muscles of respiration.  On BiPAP during night CARDIOVASCULAR: S1, S2 normal. No murmurs, rubs, or gallops.  ABDOMEN: Soft, nontender, very distended.  Hypoactive bowel sounds  eXTREMITIES: No pedal edema, cyanosis, or clubbing.  NEUROLOGIC: Awake and alert oriented x2-3 PSYCHIATRIC: Awake and alert oriented x2-3 SKIN: No obvious rash,  lesion, or ulcer.   Physical Exam LABORATORY PANEL:   CBC Recent Labs  Lab 11/08/16 0501  WBC 10.5  HGB 11.4*  HCT 34.8*  PLT 221   ------------------------------------------------------------------------------------------------------------------  Chemistries  Recent Labs  Lab 11/11/16 0416  NA 140  K 3.1*  CL 99*  CO2 29  GLUCOSE 108*  BUN 20  CREATININE 0.86  CALCIUM 8.9  MG 1.9   ------------------------------------------------------------------------------------------------------------------  Cardiac Enzymes No results for input(s): TROPONINI in the last 168 hours. ------------------------------------------------------------------------------------------------------------------  RADIOLOGY:  Dg Abd 1 View  Result Date: 11/11/2016 CLINICAL DATA:  Abdominal distension. EXAM: ABDOMEN - 1 VIEW COMPARISON:  11/09/2016 FINDINGS: There is diffuse gaseous dilation of the colon. The small bowel loops are decompressed. Enteric catheter in unchanged position, presumably in the gastric body. IMPRESSION: Diffuse gaseous distension of the colon, nonspecific. Electronically Signed   By: Fidela Salisbury M.D.   On: 11/11/2016 07:15    ASSESSMENT AND PLAN:    *Acute hypoxic respiratory failure with CO2 narcosis Likely better  BiPAP as needed and nightly Intensivist following the patient Discussed with Dr. Mortimer Fries he seems to feel that this may be related to his psychiatric medications  * Sepsis ([place]) -  Pneumonia with parapneumonic effusion Sputum culture is multiple bacteria consistent with respiratory flora Finished course of antibiotics, febrile  today  *Abdominal distention possible ileus NG tube for decompression, had a bowel movement 11/10  *Hypokalemia replace potassium  *Schizophrenia management per psychiatry Planning to increase Saphris dose, discussed with Dr. Jake Michaelis  *  AKI (acute kidney injury) ([place]) -dehydration Resolved,     *  Diabetes mellitus  due to  underlying condition without complication, without long-term current use of insulin ([name]) -sliding scale insulin with corresponding glucose checks   HBA1C is 5.7  *  Essential hypertension -continue atenolol at 50 mg,   *  OSA (obstructive sleep apnea) -BiPAP nightly  *  Hypothyroidism -home dose thyroid replacement   Physical therapy consult pending   All the records are reviewed and case discussed with Care Management/Social Workerr. Management plans discussed with the RN and intensivist  CODE STATUS: Full.  TOTAL TIME TAKING CARE OF THIS PATIENT: 35 minutes.     POSSIBLE D/C IN 1 DAYS, DEPENDING ON CLINICAL CONDITION.   Nicholes Mango M.D on 11/11/2016   Between 7am to 6pm - Pager - [phone]  After 6pm go to [email or web address] - password [email or web address] [facility]  Office  [phone]  CC: Primary care physician; Cletis Athens, MD  Note: This dictation was prepared with Dragon dictation along with smaller phrase technology. Any transcriptional errors that result from this process are unintentional.

## 2016-11-11 NOTE — Progress Notes (Signed)
MEDICATION RELATED CONSULT NOTE - ELECTROLYTES  Pharmacy Consult for electrolytes Indication: hypokalemia  Labs: Sodium (mmol/L)  Date Value  11/11/2016 140  04/09/2014 138   Potassium (mmol/L)  Date Value  11/11/2016 3.1 (L)  04/09/2014 4.3   Magnesium (mg/dL)  Date Value  11/11/2016 1.9   Phosphorus (mg/dL)  Date Value  11/11/2016 4.4   Calcium (mg/dL)  Date Value  11/11/2016 8.9   Calcium, Total (mg/dL)  Date Value  04/09/2014 8.4 (L)   Albumin (g/dL)  Date Value  11/01/2016 3.7  04/09/2014 3.8    Estimated Creatinine Clearance: 173.4 mL/min (by C-G formula based on SCr of 0.86 mg/dL).   Assessment: 42 yo male admitted with AMS. Pharmacy has been consulted to monitor and replace electrolytes.   Goal of Therapy:  [name] = 3.5 - 5  Plan:  [name] = 3.1, Will replace with potassium 71mEQ x once and recheck tonight at 18:00.   Pharmacy will continue to monitor and adjust as needed.   John [name], [name] 11/11/2016,8:52 AM

## 2016-11-11 NOTE — Progress Notes (Signed)
Per Dr Mortimer Fries ok to DC NG tube; pt has active bowel sounds, has eaten solid food, has had several formed stools in past 12-16 hours

## 2016-11-12 LAB — BLOOD GAS, ARTERIAL
ACID-BASE EXCESS: 10.3 mmol/L — AB (ref 0.0–2.0)
Bicarbonate: 36.8 mmol/L — ABNORMAL HIGH (ref 20.0–28.0)
DELIVERY SYSTEMS: POSITIVE
FIO2: 5
O2 SAT: 96 %
PATIENT TEMPERATURE: 37
PEEP: 7 cmH2O
pCO2 arterial: 58 mmHg — ABNORMAL HIGH (ref 32.0–48.0)
pH, Arterial: 7.41 (ref 7.350–7.450)
pO2, Arterial: 81 mmHg — ABNORMAL LOW (ref 83.0–108.0)

## 2016-11-12 LAB — GLUCOSE, CAPILLARY
GLUCOSE-CAPILLARY: 164 mg/dL — AB (ref 65–99)
Glucose-Capillary: 87 mg/dL (ref 65–99)
Glucose-Capillary: 92 mg/dL (ref 65–99)
Glucose-Capillary: 68 mg/dL (ref 65–99)

## 2016-11-12 LAB — BASIC METABOLIC PANEL
Anion gap: 10 (ref 5–15)
BUN: 19 mg/dL (ref 6–20)
CALCIUM: 8.7 mg/dL — AB (ref 8.9–10.3)
CO2: 29 mmol/L (ref 22–32)
CREATININE: 0.89 mg/dL (ref 0.61–1.24)
Chloride: 97 mmol/L — ABNORMAL LOW (ref 101–111)
GFR calc non Af Amer: >60 mL/min (ref >60)
Glucose, Bld: 109 mg/dL — ABNORMAL HIGH (ref 65–99)
Potassium: 3 mmol/L — ABNORMAL LOW (ref 3.5–5.1)
SODIUM: 136 mmol/L (ref 135–145)

## 2016-11-12 MED ORDER — POTASSIUM CHLORIDE CRYS ER 20 MEQ PO TBCR
40.0000 meq | EXTENDED_RELEASE_TABLET | ORAL | Status: AC
  Administered 2016-11-12 (×2): 40 meq via ORAL
  Filled 2016-11-12 (×2): qty 2

## 2016-11-12 MED ORDER — SODIUM CHLORIDE 0.9% FLUSH
3.0000 mL | Freq: Two times a day (BID) | INTRAVENOUS | Status: DC
  Administered 2016-11-12 – 2016-11-14 (×4): 3 mL via INTRAVENOUS

## 2016-11-12 MED ORDER — PREMIER PROTEIN SHAKE
11.0000 [oz_av] | Freq: Two times a day (BID) | ORAL | Status: DC
  Administered 2016-11-14: 11 [oz_av] via ORAL

## 2016-11-12 MED ORDER — POTASSIUM CHLORIDE CRYS ER 20 MEQ PO TBCR: 40.0000 meq | EXTENDED_RELEASE_TABLET | Freq: Once | ORAL | Status: DC

## 2016-11-12 MED ORDER — ADULT MULTIVITAMIN W/MINERALS CH
1.0000 | ORAL_TABLET | Freq: Every day | ORAL | Status: DC
  Administered 2016-11-12 – 2016-11-14 (×3): 1 via ORAL
  Filled 2016-11-12 (×3): qty 1

## 2016-11-12 MED ORDER — PANTOPRAZOLE SODIUM 40 MG PO PACK
40.0000 mg | PACK | Freq: Every day | ORAL | Status: DC
  Administered 2016-11-13 – 2016-11-14 (×2): 40 mg via ORAL
  Filled 2016-11-12 (×2): qty 20

## 2016-11-12 NOTE — Progress Notes (Signed)
[facility] [place] NAME: John [name]    MR#:  [id number]  DATE OF BIRTH:  1975/07/15  SUBJECTIVE:  CHIEF COMPLAINT:   Chief Complaint  Patient presents with  . Altered Mental Status   Patient was transferred to the floor and had to return back to ICU 11/5 due to decreasing responsiveness 11/10 - currently off BiPAP but abdomen is distended has NG tube Had 1 bowel movement 11/10 Lethargic today arousable but falling asleep   Review of system unobtainable    DRUG ALLERGIES:  No Known Allergies  VITALS:  Blood pressure 121/82, pulse 83, temperature 98.2 F (36.8 C), temperature source Oral, resp. rate 18, height 6\' 2"  (1.88 m), weight (!) 147.3 kg (324 lb 11.2 oz), SpO2 100 %.  PHYSICAL EXAMINATION:  GENERAL:  41 y.o.-year-old patient lying in the bed with no acute distress.  EYES: Pupils equal, round, reactive to light and accommodation. No scleral icterus.  HEENT: Head atraumatic, normocephalic. Oropharynx and nasopharynx clear.  NECK:  Supple, no jugular venous distention. No thyroid enlargement, no tenderness.  LUNGS: mod breath sounds bilaterally, no wheezing, have b/l crepitation. No use of accessory muscles of respiration.  On BiPAP during night CARDIOVASCULAR: S1, S2 normal. No murmurs, rubs, or gallops.  ABDOMEN: Soft, nontender, very distended.  Hypoactive bowel sounds  eXTREMITIES: No pedal edema, cyanosis, or clubbing.  NEUROLOGIC: Delirious  pSYCHIATRIC: Delirious  sKIN: No obvious rash, lesion, or ulcer.   Physical Exam LABORATORY PANEL:   CBC Recent Labs  Lab 11/08/16 0501  WBC 10.5  HGB 11.4*  HCT 34.8*  PLT 221   ------------------------------------------------------------------------------------------------------------------  Chemistries  Recent Labs  Lab 11/11/16 0416  11/12/16 0530  NA 140  --  136  K 3.1*   < > 3.0*  CL 99*  --  97*  CO2 29  --  29  GLUCOSE 108*  --  109*  BUN 20  --  19   CREATININE 0.86  --  0.89  CALCIUM 8.9  --  8.7*  MG 1.9  --   --    < > = values in this interval not displayed.   ------------------------------------------------------------------------------------------------------------------  Cardiac Enzymes No results for input(s): TROPONINI in the last 168 hours. ------------------------------------------------------------------------------------------------------------------  RADIOLOGY:  Dg Abd 1 View  Result Date: 11/11/2016 CLINICAL DATA:  Abdominal distension. EXAM: ABDOMEN - 1 VIEW COMPARISON:  11/09/2016 FINDINGS: There is diffuse gaseous dilation of the colon. The small bowel loops are decompressed. Enteric catheter in unchanged position, presumably in the gastric body. IMPRESSION: Diffuse gaseous distension of the colon, nonspecific. Electronically Signed   By: Fidela Salisbury M.D.   On: 11/11/2016 07:15    ASSESSMENT AND PLAN:    *Acute hypoxic respiratory failure with CO2 narcosis Clinically improved but lethargic again today it is assumed to be from psych medications  BiPAP as needed and nightly   *Schizophrenia management per psychiatry  increased Saphris dose, discussed with Dr. Jake Michaelis yesterday Patient is started on Depakote for mood stabilization, patient is lethargic today, follow-up with psychiatry   * Sepsis ([place]) -  Pneumonia with parapneumonic effusion Sputum culture is multiple bacteria consistent with respiratory flora Finished course of antibiotics, febrile  today  *Abdominal distention possible ileus NG tube for decompression, had a bowel movement 11/10  *Hypokalemia replace potassium  *  AKI (acute kidney injury) ([place]) -dehydration Resolved,     *  Diabetes mellitus due to underlying condition without complication, without long-term current use  of insulin ([place]) -sliding scale insulin with corresponding glucose checks   HBA1C is 5.7  *  Essential hypertension -continue atenolol at 50 mg,   *  OSA  (obstructive sleep apnea) -BiPAP nightly  *  Hypothyroidism -home dose thyroid replacement   Physical therapy consult pending   All the records are reviewed and case discussed with Care Management/Social Workerr. Management plans discussed with the RN  CODE STATUS: Full.  TOTAL TIME TAKING CARE OF THIS PATIENT: 35 minutes.     POSSIBLE D/C IN 1 DAYS, DEPENDING ON CLINICAL CONDITION.   Nicholes Mango M.D on 11/12/2016   Between 7am to 6pm - Pager - [phone]  After 6pm go to [email or web address] - password [email or web address] [facility]  Office  [phone]  CC: Primary care physician; Cletis Athens, MD  Note: This dictation was prepared with Dragon dictation along with smaller phrase technology. Any transcriptional errors that result from this process are unintentional.

## 2016-11-12 NOTE — Consult Note (Signed)
[facility] Face-to-Face Psychiatry Consult   Reason for Consult: Consult follow-up 41 year old man with schizophrenia and developmental disability. Referring Physician:  Gouru Patient Identification: John [name]. MRN:  [id number] Principal Diagnosis: Paranoid schizophrenia, chronic condition ([place]) Diagnosis:   Patient Active Problem List   Diagnosis Date Noted  . Encephalopathy acute [G93.40]   . Somnolence [R40.0] 11/01/2016  . AKI (acute kidney injury) ([place]) [N17.9] 11/01/2016  . Sepsis ([place]) [A41.9] 04/27/2016  . Respiratory failure ([place]) [J96.90] 12/20/2015  . Cardiomyopathy ([place]) [I42.9] 11/28/2015  . Tobacco abuse [Z72.0] 11/28/2015  . Acute respiratory failure with hypercapnia ([name]) [J96.02]   . OSA (obstructive sleep apnea) [G47.33]   . Essential hypertension [I10] 11/25/2015  . Hypothyroidism [E03.9] 11/25/2015  . Paranoid schizophrenia, chronic condition ([place]) [F20.0] 11/25/2015  . Acute respiratory failure ([place]) [J96.00]   . Acute exacerbation of COPD with asthma ([place]) [J44.1, J45.901]   . Dyslipidemia associated with type 2 diabetes mellitus ([place]) [E11.69, E78.5]   . Diabetes mellitus due to underlying condition without complication, without long-term current use of insulin ([name]) [E08.9]   . Morbid obesity ([place]) [E66.01] 09/29/2008    Total Time spent with patient: 20 minutes  Subjective:   John Caban. is a 41 y.o. male patient admitted with "I am feeling really good".  HPI: Patient seems to have improved over the weekend.  When I saw him last he was in the intensive care unit but he has now been discharged back to the floor.  He was awake and very interactive this evening.  Good eye contact.  Answered questions fairly reasonably.  Smiling and upbeat.  Denies hallucinations denies any suicidal thoughts.  Does not appear to be presenting agitation  Past Psychiatric History: History of schizophrenia or schizoaffective disorder who has had psychotic and manic episodes  before also developmental disability.  When he came into the hospital he was on clozapine and lithium and Depakote.  I have stopped the clozapine and lithium for now and replace them with Saphris.  Risk to Self: Is patient at risk for suicide?: No Risk to Others:   Prior Inpatient Therapy:   Prior Outpatient Therapy:    Past Medical History:  Past Medical History:  Diagnosis Date  . Anemia   . Asthma   . Diabetes mellitus   . Morbid obesity ([place])   . Paranoid schizophrenia ([place])   . Personality disorder ([place])   . Sleep apnea   . Thyroid disease     Past Surgical History:  Procedure Laterality Date  . FRACTURE SURGERY     Family History:  Family History  Problem Relation Age of Onset  . Glaucoma Mother    Family Psychiatric  History: Unknown Social History:  Social History   Substance and Sexual Activity  Alcohol Use Yes   Comment: only once in a while     Social History   Substance and Sexual Activity  Drug Use No    Social History   Socioeconomic History  . Marital status: Single    Spouse name: None  . Number of children: None  . Years of education: None  . Highest education level: None  Social Needs  . Financial resource strain: None  . Food insecurity - worry: None  . Food insecurity - inability: None  . Transportation needs - medical: None  . Transportation needs - non-medical: None  Occupational History  . None  Tobacco Use  . Smoking status: Former Research scientist (life sciences)  . Smokeless tobacco: Never Used  Substance and Sexual Activity  . Alcohol use: Yes    Comment: only once in a while  . Drug use: No  . Sexual activity: None  Other Topics Concern  . None  Social History Narrative  . None   Additional Social History:    Allergies:  No Known Allergies  Labs:  Results for orders placed or performed during the hospital encounter of 11/01/16 (from the past 48 hour(s))  Glucose, capillary     Status: Abnormal   Collection Time: 11/10/16  7:46 PM   Result Value Ref Range   Glucose-Capillary 116 (H) 65 - 99 mg/dL   Comment 1 Notify RN    Comment 2 Document in Chart   Glucose, capillary     Status: Abnormal   Collection Time: 11/10/16  9:22 PM  Result Value Ref Range   Glucose-Capillary 105 (H) 65 - 99 mg/dL  Glucose, capillary     Status: Abnormal   Collection Time: 11/11/16  3:58 AM  Result Value Ref Range   Glucose-Capillary 105 (H) 65 - 99 mg/dL   Comment 1 Notify RN    Comment 2 Document in Chart   Basic metabolic panel     Status: Abnormal   Collection Time: 11/11/16  4:16 AM  Result Value Ref Range   Sodium 140 135 - 145 mmol/L   Potassium 3.1 (L) 3.5 - 5.1 mmol/L   Chloride 99 (L) 101 - 111 mmol/L   CO2 29 22 - 32 mmol/L   Glucose, Bld 108 (H) 65 - 99 mg/dL   BUN 20 6 - 20 mg/dL   Creatinine, Ser 0.86 0.61 - 1.24 mg/dL   Calcium 8.9 8.9 - 10.3 mg/dL   GFR calc non Af Amer >60 >60 mL/min   GFR calc Af Amer >60 >60 mL/min    Comment: (NOTE) The eGFR has been calculated using the CKD EPI equation. This calculation has not been validated in all clinical situations. eGFR's persistently <60 mL/min signify possible Chronic Kidney Disease.    Anion gap 12 5 - 15  Magnesium     Status: None   Collection Time: 11/11/16  4:16 AM  Result Value Ref Range   Magnesium 1.9 1.7 - 2.4 mg/dL  Phosphorus     Status: None   Collection Time: 11/11/16  4:16 AM  Result Value Ref Range   Phosphorus 4.4 2.5 - 4.6 mg/dL  Glucose, capillary     Status: Abnormal   Collection Time: 11/11/16  7:20 AM  Result Value Ref Range   Glucose-Capillary 113 (H) 65 - 99 mg/dL  Glucose, capillary     Status: None   Collection Time: 11/11/16 11:54 AM  Result Value Ref Range   Glucose-Capillary 91 65 - 99 mg/dL   Comment 1 Notify RN   Glucose, capillary     Status: Abnormal   Collection Time: 11/11/16  5:15 PM  Result Value Ref Range   Glucose-Capillary 105 (H) 65 - 99 mg/dL  Potassium     Status: Abnormal   Collection Time: 11/11/16  6:04  PM  Result Value Ref Range   Potassium 3.2 (L) 3.5 - 5.1 mmol/L  Glucose, capillary     Status: Abnormal   Collection Time: 11/11/16  9:40 PM  Result Value Ref Range   Glucose-Capillary 104 (H) 65 - 99 mg/dL  Basic metabolic panel     Status: Abnormal   Collection Time: 11/12/16  5:30 AM  Result Value Ref Range   Sodium 136 135 -  145 mmol/L   Potassium 3.0 (L) 3.5 - 5.1 mmol/L   Chloride 97 (L) 101 - 111 mmol/L   CO2 29 22 - 32 mmol/L   Glucose, Bld 109 (H) 65 - 99 mg/dL   BUN 19 6 - 20 mg/dL   Creatinine, Ser 0.89 0.61 - 1.24 mg/dL   Calcium 8.7 (L) 8.9 - 10.3 mg/dL   GFR calc non Af Amer >60 >60 mL/min   GFR calc Af Amer >60 >60 mL/min    Comment: (NOTE) The eGFR has been calculated using the CKD EPI equation. This calculation has not been validated in all clinical situations. eGFR's persistently <60 mL/min signify possible Chronic Kidney Disease.    Anion gap 10 5 - 15  Glucose, capillary     Status: None   Collection Time: 11/12/16  7:15 AM  Result Value Ref Range   Glucose-Capillary 87 65 - 99 mg/dL  Glucose, capillary     Status: Abnormal   Collection Time: 11/12/16 11:24 AM  Result Value Ref Range   Glucose-Capillary 164 (H) 65 - 99 mg/dL  Glucose, capillary     Status: None   Collection Time: 11/12/16  4:19 PM  Result Value Ref Range   Glucose-Capillary 92 65 - 99 mg/dL    Current Facility-Administered Medications  Medication Dose Route Frequency Provider Last Rate Last Dose  . acetaminophen (TYLENOL) tablet 650 mg  650 mg Oral Q6H PRN Lance Coon, MD   650 mg at 11/02/16 2316   Or  . acetaminophen (TYLENOL) suppository 650 mg  650 mg Rectal Q6H PRN Lance Coon, MD   650 mg at 11/10/16 0448  . asenapine (SAPHRIS) sublingual tablet 5 mg  5 mg Sublingual BID Amarri Michaelson, Madie Reno, MD   5 mg at 11/12/16 0927  . atenolol (TENORMIN) tablet 50 mg  50 mg Oral Daily Gouru, Aruna, MD   50 mg at 11/12/16 0927  . atorvastatin (LIPITOR) tablet 10 mg  10 mg Oral Daily Lance Coon, MD   10 mg at 11/12/16 0926  . bisacodyl (DULCOLAX) suppository 10 mg  10 mg Rectal Daily PRN Mikael Spray, NP   10 mg at 11/10/16 0448  . divalproex (DEPAKOTE ER) 24 hr tablet 2,000 mg  2,000 mg Oral QHS Vaughan Basta, MD   2,000 mg at 11/11/16 2339  . guaiFENesin-dextromethorphan (ROBITUSSIN DM) 100-10 MG/5ML syrup 5 mL  5 mL Oral Q4H PRN Vaughan Basta, MD   5 mL at 11/07/16 0119  . heparin injection 5,000 Units  5,000 Units Subcutaneous Driscilla Moats, MD   5,000 Units at 11/12/16 1445  . ibuprofen (ADVIL,MOTRIN) tablet 600 mg  600 mg Oral Q6H PRN Flora Lipps, MD      . insulin aspart (novoLOG) injection 0-5 Units  0-5 Units Subcutaneous QHS Vaughan Basta, MD      . insulin aspart (novoLOG) injection 0-9 Units  0-9 Units Subcutaneous TID WC Vaughan Basta, MD   2 Units at 11/12/16 1201  . ipratropium-albuterol (DUONEB) 0.5-2.5 (3) MG/3ML nebulizer solution 3 mL  3 mL Nebulization Q6H Gouru, Aruna, MD   3 mL at 11/12/16 1406  . latanoprost (XALATAN) 0.005 % ophthalmic solution 1 drop  1 drop Both Eyes QHS Lance Coon, MD   1 drop at 11/11/16 2341  . levothyroxine (SYNTHROID, LEVOTHROID) tablet 75 mcg  75 mcg Oral QAC breakfast Lance Coon, MD   75 mcg at 11/12/16 0510  . losartan (COZAAR) tablet 50 mg  50 mg Oral Daily Kasa, Kurian,  MD   50 mg at 11/12/16 0926  . MEDLINE mouth rinse  15 mL Mouth Rinse BID Lance Coon, MD   15 mL at 11/12/16 3704  . ondansetron (ZOFRAN) tablet 4 mg  4 mg Oral Q6H PRN Lance Coon, MD       Or  . ondansetron [facility]) injection 4 mg  4 mg Intravenous Q6H PRN Lance Coon, MD      . Derrill Memo ON 11/13/2016] pantoprazole sodium (PROTONIX) 40 mg/20 mL oral suspension 40 mg  40 mg Oral Daily Gouru, Aruna, MD        Musculoskeletal: Strength & Muscle Tone: within normal limits Gait & Station: unsteady Patient leans: N/A  Psychiatric Specialty Exam: Physical Exam  Nursing note and vitals  reviewed. Constitutional: He appears well-developed and well-nourished.  HENT:  Head: Normocephalic and atraumatic.  Eyes: Conjunctivae are normal. Pupils are equal, round, and reactive to light.  Neck: Normal range of motion.  Cardiovascular: Regular rhythm and normal heart sounds.  Respiratory: Effort normal. No respiratory distress.  GI: Soft.  Musculoskeletal: Normal range of motion.  Neurological: He is alert.  Skin: Skin is warm and dry.  Psychiatric: He has a normal mood and affect. His behavior is normal. Judgment and thought content normal.    Review of Systems  Constitutional: Negative.   HENT: Negative.   Eyes: Negative.   Respiratory: Negative.   Cardiovascular: Negative.   Gastrointestinal: Negative.   Musculoskeletal: Negative.   Skin: Negative.   Neurological: Negative.   Psychiatric/Behavioral: Negative.     Blood pressure 125/80, pulse 85, temperature 98.1 F (36.7 C), temperature source Oral, resp. rate 18, height 6' 2"  (1.88 m), weight (!) 147.3 kg (324 lb 11.2 oz), SpO2 99 %.Body mass index is 41.69 kg/m.  General Appearance: Casual  Eye Contact:  Good  Speech:  Clear and Coherent  Volume:  Decreased  Mood:  Euthymic  Affect:  Appropriate  Thought Process:  Goal Directed  Orientation:  Full (Time, Place, and Person)  Thought Content:  Tangential  Suicidal Thoughts:  No  Homicidal Thoughts:  No  Memory:  Immediate;   Fair Recent;   Fair Remote;   Fair  Judgement:  Fair  Insight:  Fair  Psychomotor Activity:  Normal  Concentration:  Concentration: Fair  Recall:  [employer] of Knowledge:  Fair  Language:  Fair  Akathisia:  No  Handed:  Right  AIMS (if indicated):     Assets:  Housing  ADL's:  Impaired  Cognition:  Impaired,  Mild  Sleep:        Treatment Plan Summary: Daily contact with patient to assess and evaluate symptoms and progress in treatment, Medication management and Plan Patient appears to be stabilizing today and given the up  and down course of this hospitalization I would definitely rather not make any acute changes if he is looking better.  Continue the current Saphris and Depakote.  I will continue to follow up as needed.  Given that he is still on oxygen I expect he is not ready for immediate discharge.  Disposition: No evidence of imminent risk to self or others at present.   Supportive therapy provided about ongoing stressors.  Alethia Berthold, MD 11/12/2016 5:00 PM

## 2016-11-12 NOTE — Progress Notes (Signed)
Physical Therapy Treatment Patient Details Name: John [name]. MRN: [id number] DOB: 28-Aug-1975 Today's Date: 11/12/2016    History of Present Illness Pt is a 41 yo male admitted 11/01/16 with acute encephalopathy of unknown etiology, acute renal failure, and possible pneumonia. He was transferred to the stepdown unit 11/5 due to worsening encephalopathy likely secondary to hypercapnia requiring Bipap.  Assessment includes: Respiratory distress from polysubstance abuse with multiple psychiatric drugs, acute encephalopathy secondary to hypercapnia, and acute renal failure.  PMH includes OSA, asthma, personality disorder, paranoid schizophrenia, and DM.     PT Comments    Pt is making good progress towards goals with improved ability to ambulate to recliner this date. BRW and 3L of O2 used for all mobility with sats at 98%. Used +2 for safety, however pt able to follow commands with good technique. Needs cues for safety and alertness as pt somewhat drowsy. Good participation for there-ex. Will continue to progress.   Follow Up Recommendations  SNF     Equipment Recommendations       Recommendations for Other Services       Precautions / Restrictions Precautions Precautions: Fall Restrictions Weight Bearing Restrictions: No    Mobility  Bed Mobility Overal bed mobility: Needs Assistance Bed Mobility: Supine to Sit     Supine to sit: Min guard     General bed mobility comments: improved ability to sit at EOB compared to previous session. Once seated, needs occasional cues to stay awake. Slight posterior leaning, however able to self correct.  Transfers Overall transfer level: Needs assistance Equipment used: Rolling walker (2 wheeled) Transfers: Sit to/from Stand Sit to Stand: Min assist;+2 physical assistance;From elevated surface         General transfer comment: slightly unsteady, however able to follow verbal cues for upright posture. Bed elevated prior to  transfer. Used BRW  Ambulation/Gait Ambulation/Gait assistance: Min guard;+2 safety/equipment Ambulation Distance (Feet): 5 Feet Assistive device: Rolling walker (2 wheeled) Gait Pattern/deviations: Step-to pattern     General Gait Details: unsteady, however able to ambulate to recliner. Fatigues quickly and has uncontrolled descent to chair. Cues for sequencing and +2 for safety. BRW used   Financial trader Rankin (Stroke Patients Only)       Balance                                            Cognition Arousal/Alertness: (sleepy) Behavior During Therapy: WFL for tasks assessed/performed Overall Cognitive Status: History of cognitive impairments - at baseline                                        Exercises Other Exercises Other Exercises: Supine ther-ex performed x 10 reps on B LE including quad sets, SLRs, hip abd/add and seated LAQ. All ther-ex performed with supervision and cues for correct technique    General Comments        Pertinent Vitals/Pain Pain Assessment: No/denies pain    Home Living                      Prior Function            PT Goals (current goals can  now be found in the care plan section) Acute Rehab PT Goals Patient Stated Goal: to get stronger PT Goal Formulation: With patient Time For Goal Achievement: 11/21/16 Potential to Achieve Goals: Good Progress towards PT goals: Progressing toward goals    Frequency    Min 2X/week      PT Plan Current plan remains appropriate    Co-evaluation              AM-PAC PT "6 Clicks" Daily Activity  Outcome Measure  Difficulty turning over in bed (including adjusting bedclothes, sheets and blankets)?: None Difficulty moving from lying on back to sitting on the side of the bed? : Unable Difficulty sitting down on and standing up from a chair with arms (e.g., wheelchair, bedside commode, etc,.)?:  Unable Help needed moving to and from a bed to chair (including a wheelchair)?: A Little Help needed walking in hospital room?: A Lot Help needed climbing 3-5 steps with a railing? : A Lot 6 Click Score: 13    End of Session Equipment Utilized During Treatment: Gait belt;Oxygen Activity Tolerance: Patient limited by fatigue Patient left: in chair;with chair alarm set Nurse Communication: Mobility status PT Visit Diagnosis: Muscle weakness (generalized) (M62.81);Difficulty in walking, not elsewhere classified (R26.2)     Time: 1027-1050 PT Time Calculation (min) (ACUTE ONLY): 23 min  Charges:  $Gait Training: 8-22 mins $Therapeutic Exercise: 8-22 mins                    G Codes:  Functional Assessment Tool Used: AM-PAC 6 Clicks Basic Mobility Functional Limitation: Mobility: Walking and moving around Mobility: Walking and Moving Around Current Status (Q0347): At least 40 percent but less than 60 percent impaired, limited or restricted Mobility: Walking and Moving Around Goal Status [phone]): At least 20 percent but less than 40 percent impaired, limited or restricted    John [name], PT, DPT [phone]    John [name] 11/12/2016, 11:32 AM

## 2016-11-12 NOTE — Progress Notes (Signed)
Patient now off bipap awake and alert, drinking water. Patient wore for appx 7 hours and has tolerated well. Now back on cannula at 3 liters

## 2016-11-12 NOTE — Progress Notes (Signed)
Pt's Potassium level is 3.0 this AM, MD Pyreddy was paged.

## 2016-11-12 NOTE — Progress Notes (Signed)
PT Cancellation Note  Patient Details Name: John [name]. MRN: [id number] DOB: [birth year]   Cancelled Treatment:    Reason Eval/Treat Not Completed: Medical issues which prohibited therapy. Per RN, pt very lethargic this date, unable to stay awake. RN recommends holding therapy at this time due to poor participation. Will re-attempt, time permitting.   [name] 11/12/2016, 9:05 AM [name], PT, DPT [phone]

## 2016-11-12 NOTE — Care Management (Signed)
Transferred out of icu 11/11.  Nasogastric tube out and tolerating diet.  Home cpap.  Physical therapy has recommended skilled nursing placement.  Awaiting [facility]

## 2016-11-12 NOTE — Progress Notes (Signed)
MEDICATION RELATED CONSULT NOTE - ELECTROLYTES  Pharmacy Consult for electrolytes Indication: hypokalemia  Labs: Sodium (mmol/L)  Date Value  11/12/2016 136  04/09/2014 138   Potassium (mmol/L)  Date Value  11/12/2016 3.0 (L)  04/09/2014 4.3   Magnesium (mg/dL)  Date Value  11/11/2016 1.9   Phosphorus (mg/dL)  Date Value  11/11/2016 4.4   Calcium (mg/dL)  Date Value  11/12/2016 8.7 (L)   Calcium, Total (mg/dL)  Date Value  04/09/2014 8.4 (L)   Albumin (g/dL)  Date Value  11/01/2016 3.7  04/09/2014 3.8    Estimated Creatinine Clearance: 170.5 mL/min (by C-G formula based on SCr of 0.89 mg/dL).   Assessment: 41 yo male admitted with AMS. Pharmacy has been consulted to monitor and replace electrolytes.   Goal of Therapy:  K = 3.5 - 5  Plan:  K = 3, will replace with KCl 40 mEq PO every 4 hours x 2 doses.  Will recheck electrolytes with AM labs tomorrow. Pharmacy will continue to monitor and adjust as needed.   Lenis Noon, [facility] 11/12/2016,11:52 AM

## 2016-11-12 NOTE — Progress Notes (Signed)
Pt Oxygen saturations remain 99-100% on room air. Pt. Is still too unsteady to tolerate ambulation at this time, however he has been up from the bed to chair and back to bed with 2 assist on room air with oxygen saturations remaining >94%.

## 2016-11-12 NOTE — Progress Notes (Signed)
Speech Therapy Note: Chart reviewed; NSG consulted; NSG reported pt is tolerating regular diet w/ no immediate, overt s/s of aspiration noted. Pt met w/ after lunch meal. Pt denied any swallowing problems and ate 100% of his meal. Pt expressed a desire to get on a diet and lose weight to be able to join a basketball team.  ST will continue to f/u w/ diet toleration and pt status while admitted. NSG/MD updated.    Carolynn Sayers, SLP-Graduate Student Carolynn Sayers 11/12/16; 12:38 PM   The information in this patient note, response to treatment, and overall treatment plan developed has been reviewed and agreed upon by this clinician.  Orinda Kenner, [place], [facility] [phone] 11/12/16,3:23 PM

## 2016-11-13 ENCOUNTER — Inpatient Hospital Stay: Payer: 59

## 2016-11-13 LAB — GLUCOSE, CAPILLARY
Glucose-Capillary: 76 mg/dL (ref 65–99)
Glucose-Capillary: 152 mg/dL — ABNORMAL HIGH (ref 65–99)
Glucose-Capillary: 98 mg/dL (ref 65–99)
Glucose-Capillary: 90 mg/dL (ref 65–99)

## 2016-11-13 LAB — CBC
HCT: 31.8 % — ABNORMAL LOW (ref 40.0–52.0)
Hemoglobin: 10.1 g/dL — ABNORMAL LOW (ref 13.0–18.0)
MCH: 26.8 pg (ref 26.0–34.0)
MCHC: 31.7 g/dL — ABNORMAL LOW (ref 32.0–36.0)
MCV: 84.6 fL (ref 80.0–100.0)
PLATELETS: 272 10*3/uL (ref 150–440)
RBC: 3.76 MIL/uL — AB (ref 4.40–5.90)
RDW: 15.3 % — AB (ref 11.5–14.5)
WBC: 8.7 10*3/uL (ref 3.8–10.6)

## 2016-11-13 LAB — MAGNESIUM: MAGNESIUM: 1.7 mg/dL (ref 1.7–2.4)

## 2016-11-13 LAB — POTASSIUM: Potassium: 3.3 mmol/L — ABNORMAL LOW (ref 3.5–5.1)

## 2016-11-13 MED ORDER — ATENOLOL 50 MG PO TABS: 50.0000 mg | ORAL_TABLET | Freq: Every day | ORAL | 0 refills | Status: DC

## 2016-11-13 MED ORDER — PREMIER PROTEIN SHAKE: 11.0000 [oz_av] | Freq: Two times a day (BID) | ORAL | 0 refills | Status: AC

## 2016-11-13 MED ORDER — POTASSIUM CHLORIDE CRYS ER 20 MEQ PO TBCR
40.0000 meq | EXTENDED_RELEASE_TABLET | Freq: Once | ORAL | Status: AC
  Administered 2016-11-13: 40 meq via ORAL
  Filled 2016-11-13: qty 2

## 2016-11-13 MED ORDER — GLIPIZIDE 5 MG PO TABS: 2.5000 mg | ORAL_TABLET | Freq: Every day | ORAL | 0 refills | Status: DC

## 2016-11-13 MED ORDER — ASENAPINE MALEATE 5 MG SL SUBL: 5.0000 mg | SUBLINGUAL_TABLET | Freq: Two times a day (BID) | SUBLINGUAL | 0 refills | Status: DC

## 2016-11-13 NOTE — Clinical Social Work Note (Signed)
CSW spoke to patient's mother and they have accepted bed offer at [facility].  CSW contacted [place] [facility] who will review patients information.    CSW received message from [facility] that patient is a Level 2 [facility] and will need extra clinical information sent to them.  CSW to fax requested information, awaiting response from [facility].  Jones Broom. [place], MSW, [place]  11/13/2016 10:17 AM

## 2016-11-13 NOTE — Progress Notes (Signed)
Nutrition Brief Note  Patient identified for LOS 10 days  41 yo male admitted 11/01/16 with acute encephalopathy of unknown etiology, acute renal failure, and possible pneumonia. He was transferred to the stepdown unit 11/5 due to worsening encephalopathy likely secondary to hypercapnia requiring Bipap. Assessment includes: Respiratory distress from polysubstance abuse with multiple psychiatric drugs, acute encephalopathy secondary to hypercapnia, and acute renal failure. PMH includes OSA, asthma, personality disorder, paranoid schizophrenia, and DM.   Wt Readings from Last 15 Encounters:  11/13/16 (!) 324 lb 6.4 oz (147.1 kg)  04/27/16 (!) 333 lb 12.8 oz (151.4 kg)  12/28/15 (!) 324 lb 1.6 oz (147 kg)  11/26/15 (!) 359 lb 12.7 oz (163.2 kg)  09/06/14 (!) 346 lb (156.9 kg)    Body mass index is 41.65 kg/m. Patient meets criteria for morbid obesity based on current BMI.   Current diet order is HH/CHO, patient is consuming approximately 100% of meals at this time. Labs and medications reviewed.   No nutrition interventions warranted at this time. If nutrition issues arise, please consult RD.   Koleen Distance MS, RD, LDN Pager #- [phone] After Hours Pager: [phone]

## 2016-11-13 NOTE — Care Management Important Message (Signed)
Important Message  Patient Details  Name: John [name]. MRN: [id number] Date of Birth: 28-Sep-1975   Medicare Important Message Given:  Yes  Signed IM notice given   Katrina Stack, RN 11/13/2016, 9:28 AM

## 2016-11-13 NOTE — Progress Notes (Signed)
[facility] NAME: John [name]    MR#:  [id number]  DATE OF BIRTH:  June 06, 1975  SUBJECTIVE:   Doing well today No abdominal pain or shortness of breath. Tolerated diet well.  REVIEW OF SYSTEMS:    Review of Systems  Constitutional: Negative for fever, chills weight loss HENT: Negative for ear pain, nosebleeds, congestion, facial swelling, rhinorrhea, neck pain, neck stiffness and ear discharge.   Respiratory: Negative for cough, shortness of breath, wheezing  Cardiovascular: Negative for chest pain, palpitations and leg swelling.  Gastrointestinal: Negative for heartburn, abdominal pain, vomiting, diarrhea or consitpation Genitourinary: Negative for dysuria, urgency, frequency, hematuria Musculoskeletal: Negative for back pain or joint pain Neurological: Negative for dizziness, seizures, syncope, focal weakness,  numbness and headaches.  Hematological: Does not bruise/bleed easily.  Psychiatric/Behavioral: Negative for hallucinations, confusion, dysphoric mood    Tolerating Diet: yes      DRUG ALLERGIES:  No Known Allergies  VITALS:  Blood pressure 117/75, pulse 82, temperature 98.1 F (36.7 C), temperature source Oral, resp. rate 16, height 6\' 2"  (1.88 m), weight (!) 147.1 kg (324 lb 6.4 oz), SpO2 100 %.  PHYSICAL EXAMINATION:  Constitutional: Appears well-developed and well-nourished. No distress. HENT: Normocephalic. Marland Kitchen Oropharynx is clear and moist.  Eyes: Conjunctivae and EOM are normal. PERRLA, no scleral icterus.  Neck: Normal ROM. Neck supple. No JVD. No tracheal deviation. CVS: RRR, S1/S2 +, no murmurs, no gallops, no carotid bruit.  Pulmonary: Effort and breath sounds normal, no stridor, rhonchi, wheezes, rales.  Abdominal: Soft. BS +,  no distension, tenderness, rebound or guarding.  Musculoskeletal: Normal range of motion. No edema and no tenderness.  Neuro: Alert. CN 2-12 grossly intact. No focal  deficits. Skin: Skin is warm and dry. No rash noted. Psychiatric: Normal mood and affect.      LABORATORY PANEL:   CBC Recent Labs  Lab 11/13/16 0611  WBC 8.7  HGB 10.1*  HCT 31.8*  PLT 272   ------------------------------------------------------------------------------------------------------------------  Chemistries  Recent Labs  Lab 11/12/16 0530 11/13/16 0611  NA 136  --   K 3.0* 3.3*  CL 97*  --   CO2 29  --   GLUCOSE 109*  --   BUN 19  --   CREATININE 0.89  --   CALCIUM 8.7*  --   MG  --  1.7   ------------------------------------------------------------------------------------------------------------------  Cardiac Enzymes No results for input(s): TROPONINI in the last 168 hours. ------------------------------------------------------------------------------------------------------------------  RADIOLOGY:  Dg Abd 1 View  Result Date: 11/13/2016 CLINICAL DATA:  Ileus EXAM: ABDOMEN - 1 VIEW COMPARISON:  11/11/2016 FINDINGS: NG tube has been removed. Diffuse gaseous distention of bowel again noted but improved since prior study. No free air organomegaly. IMPRESSION: Mild diffuse gaseous distention of bowel continues but improved since prior study. Interval removal of NG tube. Electronically Signed   By: Rolm Baptise M.D.   On: 11/13/2016 08:47     ASSESSMENT AND PLAN:   41 year old male with morbid obesity. 9 schizophrenia who presented with altered mental status.  1. Acute hypoxic respiratory failure due to CO2 narcosis causing initial altered mental status Patient symptoms have resolved. He was in the ICU on BiPAP initially.  2. Paranoid schizophrenia: Patient was evaluated by psychiatrist. He will continue medications as per the recommendations.  3. Sepsis due to pneumonia with parapneumonic effusion Patient has completed course of antibiotics.  4. Abdominal distention due to ileus which has resolved  5. Hypokalemia: Potassium was repleted  6.  Acute kidney injury due to dehydration and sepsis which is resolved  7. Diabetes: A1c is 5.7 Recommendations for ADD diet and monitoring of blood sugars  8. Essential hypertension: Continue atenolol and losartan  9. OSA: Patient would benefit from CPAP nightly  10. Hypothyroidism: Continue Synthroid  Management plans discussed with the patient and he is in agreement.  CODE STATUS: full  TOTAL TIME TAKING CARE OF THIS PATIENT: 21 minutes.     POSSIBLE D/C today SNF, DEPENDING ON CLINICAL CONDITION.   Rube Sanchez M.D on 11/13/2016 at 8:57 AM  Between 7am to 6pm - Pager - [phone] After 6pm go to [email or web address] - password [email or web address] [facility]  Office  [phone]  CC: Primary care physician; Cletis Athens, MD  Note: This dictation was prepared with Dragon dictation along with smaller phrase technology. Any transcriptional errors that result from this process are unintentional.

## 2016-11-13 NOTE — Consult Note (Signed)
[facility] Consult   Reason for Consult: Consult for 41 year old man with schizophrenia or schizoaffective disorder and developmental disability.  He has been followed in the hospital for altered mental status during his medical problems. Referring Physician:  Mody Patient Identification: John [name]. MRN:  [id number] Principal Diagnosis: Paranoid schizophrenia, chronic condition ([place]) Diagnosis:   Patient Active Problem List   Diagnosis Date Noted  . Encephalopathy acute [G93.40]   . Somnolence [R40.0] 11/01/2016  . AKI (acute kidney injury) ([place]) [N17.9] 11/01/2016  . Sepsis ([place]) [A41.9] 04/27/2016  . Respiratory failure ([place]) [J96.90] 12/20/2015  . Cardiomyopathy ([place]) [I42.9] 11/28/2015  . Tobacco abuse [Z72.0] 11/28/2015  . Acute respiratory failure with hypercapnia ([name]) [J96.02]   . OSA (obstructive sleep apnea) [G47.33]   . Essential hypertension [I10] 11/25/2015  . Hypothyroidism [E03.9] 11/25/2015  . Paranoid schizophrenia, chronic condition ([place]) [F20.0] 11/25/2015  . Acute respiratory failure ([place]) [J96.00]   . Acute exacerbation of COPD with asthma ([place]) [J44.1, J45.901]   . Dyslipidemia associated with type 2 diabetes mellitus ([place]) [E11.69, E78.5]   . Diabetes mellitus due to underlying condition without complication, without long-term current use of insulin ([name]) [E08.9]   . Morbid obesity ([place]) [E66.01] 09/29/2008    Total Time spent with patient: 20 minutes  Subjective:   John [name]. is a 41 y.o. male patient admitted with "I am doing great".  HPI: Patient seen chart reviewed.  Patient was reading when I came to see him.  He was calm.  Wide awake.  Perfectly appropriate in his interactions.  Seems simple in his thought but not paranoid no evidence of dangerousness.  The patient is looking forward to possibly being discharged from the hospital.  Past Psychiatric History: Past history of schizoaffective disorder with past history of  hospitalizations and agitation when manic.  Risk to Self: Is patient at risk for suicide?: No Risk to Others:   Prior Inpatient Therapy:   Prior Outpatient Therapy:    Past Medical History:  Past Medical History:  Diagnosis Date  . Anemia   . Asthma   . Diabetes mellitus   . Morbid obesity ([place])   . Paranoid schizophrenia ([place])   . Personality disorder ([place])   . Sleep apnea   . Thyroid disease     Past Surgical History:  Procedure Laterality Date  . FRACTURE SURGERY     Family History:  Family History  Problem Relation Age of Onset  . Glaucoma Mother    Family Psychiatric  History: None known Social History:  Social History   Substance and Sexual Activity  Alcohol Use Yes   Comment: only once in a while     Social History   Substance and Sexual Activity  Drug Use No    Social History   Socioeconomic History  . Marital status: Single    Spouse name: None  . Number of children: None  . Years of education: None  . Highest education level: None  Social Needs  . Financial resource strain: None  . Food insecurity - worry: None  . Food insecurity - inability: None  . Transportation needs - medical: None  . Transportation needs - non-medical: None  Occupational History  . None  Tobacco Use  . Smoking status: Former Research scientist (life sciences)  . Smokeless tobacco: Never Used  Substance and Sexual Activity  . Alcohol use: Yes    Comment: only once in a while  . Drug use: No  . Sexual activity: None  Other Topics Concern  . None  Social History Narrative  . None   Additional Social History:    Allergies:  No Known Allergies  Labs:  Results for orders placed or performed during the hospital encounter of 11/01/16 (from the past 48 hour(s))  Glucose, capillary     Status: Abnormal   Collection Time: 11/11/16  9:40 PM  Result Value Ref Range   Glucose-Capillary 104 (H) 65 - 99 mg/dL  Basic metabolic panel     Status: Abnormal   Collection Time: 11/12/16  5:30 AM   Result Value Ref Range   Sodium 136 135 - 145 mmol/L   Potassium 3.0 (L) 3.5 - 5.1 mmol/L   Chloride 97 (L) 101 - 111 mmol/L   CO2 29 22 - 32 mmol/L   Glucose, Bld 109 (H) 65 - 99 mg/dL   BUN 19 6 - 20 mg/dL   Creatinine, Ser 0.89 0.61 - 1.24 mg/dL   Calcium 8.7 (L) 8.9 - 10.3 mg/dL   GFR calc non Af Amer >60 >60 mL/min   GFR calc Af Amer >60 >60 mL/min    Comment: (NOTE) The eGFR has been calculated using the CKD EPI equation. This calculation has not been validated in all clinical situations. eGFR's persistently <60 mL/min signify possible Chronic Kidney Disease.    Anion gap 10 5 - 15  Glucose, capillary     Status: None   Collection Time: 11/12/16  7:15 AM  Result Value Ref Range   Glucose-Capillary 87 65 - 99 mg/dL  Glucose, capillary     Status: Abnormal   Collection Time: 11/12/16 11:24 AM  Result Value Ref Range   Glucose-Capillary 164 (H) 65 - 99 mg/dL  Glucose, capillary     Status: None   Collection Time: 11/12/16  4:19 PM  Result Value Ref Range   Glucose-Capillary 92 65 - 99 mg/dL  Glucose, capillary     Status: None   Collection Time: 11/12/16  8:45 PM  Result Value Ref Range   Glucose-Capillary 68 65 - 99 mg/dL   Comment 1 Notify RN   CBC     Status: Abnormal   Collection Time: 11/13/16  6:11 AM  Result Value Ref Range   WBC 8.7 3.8 - 10.6 K/uL   RBC 3.76 (L) 4.40 - 5.90 MIL/uL   Hemoglobin 10.1 (L) 13.0 - 18.0 g/dL   HCT 31.8 (L) 40.0 - 52.0 %   MCV 84.6 80.0 - 100.0 fL   MCH 26.8 26.0 - 34.0 pg   MCHC 31.7 (L) 32.0 - 36.0 g/dL   RDW 15.3 (H) 11.5 - 14.5 %   Platelets 272 150 - 440 K/uL  Potassium     Status: Abnormal   Collection Time: 11/13/16  6:11 AM  Result Value Ref Range   Potassium 3.3 (L) 3.5 - 5.1 mmol/L  Magnesium     Status: None   Collection Time: 11/13/16  6:11 AM  Result Value Ref Range   Magnesium 1.7 1.7 - 2.4 mg/dL  Glucose, capillary     Status: None   Collection Time: 11/13/16  8:08 AM  Result Value Ref Range    Glucose-Capillary 76 65 - 99 mg/dL  Glucose, capillary     Status: Abnormal   Collection Time: 11/13/16 11:54 AM  Result Value Ref Range   Glucose-Capillary 152 (H) 65 - 99 mg/dL  Glucose, capillary     Status: None   Collection Time: 11/13/16  4:43 PM  Result Value Ref Range  Glucose-Capillary 98 65 - 99 mg/dL    Current Facility-Administered Medications  Medication Dose Route Frequency Provider Last Rate Last Dose  . acetaminophen (TYLENOL) tablet 650 mg  650 mg Oral Q6H PRN Lance Coon, MD   650 mg at 11/02/16 2316   Or  . acetaminophen (TYLENOL) suppository 650 mg  650 mg Rectal Q6H PRN Lance Coon, MD   650 mg at 11/10/16 0448  . asenapine (SAPHRIS) sublingual tablet 5 mg  5 mg Sublingual BID Clapacs, Madie Reno, MD   5 mg at 11/13/16 0847  . atenolol (TENORMIN) tablet 50 mg  50 mg Oral Daily Gouru, Aruna, MD   50 mg at 11/13/16 0848  . atorvastatin (LIPITOR) tablet 10 mg  10 mg Oral Daily Lance Coon, MD   10 mg at 11/13/16 0848  . bisacodyl (DULCOLAX) suppository 10 mg  10 mg Rectal Daily PRN Mikael Spray, NP   10 mg at 11/10/16 0448  . divalproex (DEPAKOTE ER) 24 hr tablet 2,000 mg  2,000 mg Oral QHS Vaughan Basta, MD   2,000 mg at 11/12/16 2112  . guaiFENesin-dextromethorphan (ROBITUSSIN DM) 100-10 MG/5ML syrup 5 mL  5 mL Oral Q4H PRN Vaughan Basta, MD   5 mL at 11/07/16 0119  . heparin injection 5,000 Units  5,000 Units Subcutaneous Driscilla Moats, MD   5,000 Units at 11/13/16 1555  . ibuprofen (ADVIL,MOTRIN) tablet 600 mg  600 mg Oral Q6H PRN Flora Lipps, MD      . insulin aspart (novoLOG) injection 0-5 Units  0-5 Units Subcutaneous QHS Vaughan Basta, MD      . insulin aspart (novoLOG) injection 0-9 Units  0-9 Units Subcutaneous TID WC Vaughan Basta, MD   2 Units at 11/13/16 1230  . ipratropium-albuterol (DUONEB) 0.5-2.5 (3) MG/3ML nebulizer solution 3 mL  3 mL Nebulization Q6H Gouru, Aruna, MD   3 mL at 11/13/16 1347  .  latanoprost (XALATAN) 0.005 % ophthalmic solution 1 drop  1 drop Both Eyes QHS Lance Coon, MD   1 drop at 11/12/16 2111  . levothyroxine (SYNTHROID, LEVOTHROID) tablet 75 mcg  75 mcg Oral QAC breakfast Lance Coon, MD   75 mcg at 11/13/16 0604  . losartan (COZAAR) tablet 50 mg  50 mg Oral Daily Flora Lipps, MD   50 mg at 11/13/16 0848  . MEDLINE mouth rinse  15 mL Mouth Rinse BID Lance Coon, MD   15 mL at 11/13/16 0850  . multivitamin with minerals tablet 1 tablet  1 tablet Oral Daily Gouru, Aruna, MD   1 tablet at 11/13/16 0848  . ondansetron (ZOFRAN) tablet 4 mg  4 mg Oral Q6H PRN Lance Coon, MD       Or  . ondansetron [facility]) injection 4 mg  4 mg Intravenous Q6H PRN Lance Coon, MD      . pantoprazole sodium (PROTONIX) 40 mg/20 mL oral suspension 40 mg  40 mg Oral Daily Gouru, Aruna, MD   40 mg at 11/13/16 0848  . protein supplement (PREMIER PROTEIN) liquid  11 oz Oral BID BM Gouru, Aruna, MD      . sodium chloride flush (NS) 0.9 % injection 3 mL  3 mL Intravenous Q12H Gouru, Aruna, MD   3 mL at 11/13/16 4315    Musculoskeletal: Strength & Muscle Tone: within normal limits Gait & Station: normal Patient leans: N/A  Psychiatric Specialty Exam: Physical Exam  Nursing note and vitals reviewed. Constitutional: He appears well-developed and well-nourished.  HENT:  Head: Normocephalic and  atraumatic.  Eyes: Conjunctivae are normal. Pupils are equal, round, and reactive to light.  Neck: Normal range of motion.  Cardiovascular: Regular rhythm and normal heart sounds.  Respiratory: Effort normal. No respiratory distress.  GI: Soft.  Musculoskeletal: Normal range of motion.  Neurological: He is alert.  Skin: Skin is warm and dry.  Psychiatric: He has a normal mood and affect. His speech is normal and behavior is normal. Thought content normal. Cognition and memory are impaired. He expresses impulsivity.    Review of Systems  Constitutional: Negative.   HENT: Negative.    Eyes: Negative.   Respiratory: Negative.   Cardiovascular: Negative.   Gastrointestinal: Negative.   Musculoskeletal: Negative.   Skin: Negative.   Neurological: Negative.   Psychiatric/Behavioral: Negative.     Blood pressure 130/74, pulse 84, temperature 97.6 F (36.4 C), temperature source Oral, resp. rate 17, height 6' 2"  (1.88 m), weight (!) 147.1 kg (324 lb 6.4 oz), SpO2 96 %.Body mass index is 41.65 kg/m.  General Appearance: Fairly Groomed  Eye Contact:  Good  Speech:  Normal Rate  Volume:  Normal  Mood:  Euthymic  Affect:  Congruent  Thought Process:  Goal Directed  Orientation:  Full (Time, Place, and Person)  Thought Content:  Logical  Suicidal Thoughts:  No  Homicidal Thoughts:  No  Memory:  Immediate;   Fair Recent;   Fair Remote;   Fair  Judgement:  Fair  Insight:  Fair  Psychomotor Activity:  Normal  Concentration:  Concentration: Fair  Recall:  [employer] of Knowledge:  Fair  Language:  Fair  Akathisia:  No  Handed:  Right  AIMS (if indicated):     Assets:  Desire for Improvement Resilience Social Support  ADL's:  Intact  Cognition:  Impaired,  Mild  Sleep:        Treatment Plan Summary: Medication management and Plan Patient had been taken off of his clozapine and lithium during the hospital stay because of the potential for affecting his medical problems.  Right now he seems to be returning to an appropriate baseline.  Rather than confusing things I am leaving his medicines as they are with the modest dose of Saphris.  He may be discharged to rehab soon and his mental health condition can be further monitored after discharge.  Disposition: Patient does not meet criteria for psychiatric inpatient admission. Supportive therapy provided about ongoing stressors.  Alethia Berthold, MD 11/13/2016 7:39 PM

## 2016-11-13 NOTE — Discharge Summary (Addendum)
[facility] NAME: John [name]    MR#:  [id number]  DATE OF BIRTH:  1975/11/24  DATE OF ADMISSION:  11/01/2016 ADMITTING PHYSICIAN: Lance Coon, MD  DATE OF DISCHARGE: 11/14/2016  PRIMARY CARE PHYSICIAN: Cletis Athens, MD    ADMISSION DIAGNOSIS:  Encephalopathy acute [G93.40] AKI (acute kidney injury) ([place]) [N17.9] Pneumonia due to infectious organism, unspecified laterality, unspecified part of lung [J18.9]  DISCHARGE DIAGNOSIS:  Principal Problem:   Paranoid schizophrenia, chronic condition ([place]) Active Problems:   Acute respiratory failure ([place])   Dyslipidemia associated with type 2 diabetes mellitus ([place])   Diabetes mellitus due to underlying condition without complication, without long-term current use of insulin ([place])   Essential hypertension   Hypothyroidism   OSA (obstructive sleep apnea)   Sepsis ([name])   Somnolence   AKI (acute kidney injury) ([place])   Encephalopathy acute   SECONDARY DIAGNOSIS:   Past Medical History:  Diagnosis Date  . Anemia   . Asthma   . Diabetes mellitus   . Morbid obesity ([place])   . Paranoid schizophrenia ([place])   . Personality disorder ([place])   . Sleep apnea   . Thyroid disease     HOSPITAL COURSE:  41 year old male with morbid obesity. 9 schizophrenia who presented with altered mental status.  1. Acute hypoxic respiratory failure due to CO2 narcosis causing initial altered mental status Patient symptoms have resolved. He was in the ICU on BiPAP initially.  2. Paranoid schizophrenia: Patient was evaluated by psychiatrist. He will continue medications as per the recommendations.  3. Sepsis due to pneumonia with parapneumonic effusion Patient has completed course of antibiotics.  4. Abdominal distention due to ileus which has resolved  5. Hypokalemia: Potassium was repleted  6. Acute kidney injury due to dehydration and sepsis which is resolved  7. Diabetes: A1c is  5.7 Recommendations for ADD diet and monitoring of blood sugars  8. Essential hypertension: Continue atenolol and losartan  9. OSA: Patient would benefit from CPAP nightly  10. Hypothyroidism: Continue Synthroid     DISCHARGE CONDITIONS AND DIET:   Stable diabetic diet  CONSULTS OBTAINED:  Treatment Team:  Nicholes Mango, MD Clapacs, Madie Reno, MD  DRUG ALLERGIES:  No Known Allergies  DISCHARGE MEDICATIONS:   Current Discharge Medication List    START taking these medications   Details  asenapine (SAPHRIS) 5 MG SUBL 24 hr tablet Place 1 tablet (5 mg total) 2 (two) times daily under the tongue. Qty: 60 tablet, Refills: 0    protein supplement shake (PREMIER PROTEIN) LIQD Take 325 mLs (11 oz total) 2 (two) times daily between meals by mouth. Qty: 19500 mL, Refills: 0      CONTINUE these medications which have CHANGED   Details  atenolol (TENORMIN) 50 MG tablet Take 1 tablet (50 mg total) daily by mouth. Qty: 30 tablet, Refills: 0    glipiZIDE (GLUCOTROL) 5 MG tablet Take 0.5 tablets (2.5 mg total) daily before breakfast by mouth. Qty: 30 tablet, Refills: 0      CONTINUE these medications which have NOT CHANGED   Details  atorvastatin (LIPITOR) 10 MG tablet Take 10 mg by mouth daily.    divalproex (DEPAKOTE ER) 500 MG 24 hr tablet Take 2,000 mg by mouth at bedtime.     furosemide (LASIX) 20 MG tablet Take 3 tablets (60 mg total) by mouth daily. Qty: 30 tablet    latanoprost (XALATAN) 0.005 % ophthalmic solution Place 1 drop into both eyes  at bedtime.    levothyroxine (SYNTHROID, LEVOTHROID) 75 MCG tablet Take 75 mcg by mouth daily.    losartan (COZAAR) 50 MG tablet Take 50 mg by mouth daily.    Multiple Vitamins-Minerals (THEREMS-M) TABS Take 1 tablet by mouth daily.    omeprazole (PRILOSEC) 20 MG capsule Take 20 mg by mouth daily.    oxybutynin (DITROPAN) 5 MG tablet Take 5 mg by mouth daily.    triamcinolone cream (KENALOG) 0.1 % Apply 1 application  topically 2 (two) times daily as needed.       STOP taking these medications     clobetasol cream (TEMOVATE) 0.05 %      clonazePAM (KLONOPIN) 0.5 MG tablet      clozapine (CLOZARIL) 200 MG tablet      fluocinolone (SYNALAR) 0.01 % external solution      lithium carbonate 300 MG capsule      loratadine (CLARITIN) 10 MG tablet      meloxicam (MOBIC) 7.5 MG tablet      metFORMIN (GLUCOPHAGE) 1000 MG tablet           Today   CHIEF COMPLAINT:  Doing well this morning. No abdominal pain.   VITAL SIGNS:  Blood pressure 116/60, pulse 64, temperature 98 F (36.7 C), temperature source Oral, resp. rate 17, height 6\' 2"  (1.88 m), weight (!) 147.7 kg (325 lb 11.2 oz), SpO2 97 %.   REVIEW OF SYSTEMS:  Review of Systems  Constitutional: Negative.  Negative for chills, fever and malaise/fatigue.  HENT: Negative.  Negative for ear discharge, ear pain, hearing loss, nosebleeds and sore throat.   Eyes: Negative.  Negative for blurred vision and pain.  Respiratory: Negative.  Negative for cough, hemoptysis, shortness of breath and wheezing.   Cardiovascular: Negative.  Negative for chest pain, palpitations and leg swelling.  Gastrointestinal: Negative.  Negative for abdominal pain, blood in stool, diarrhea, nausea and vomiting.  Genitourinary: Negative.  Negative for dysuria.  Musculoskeletal: Negative.  Negative for back pain.  Skin: Negative.   Neurological: Negative for dizziness, tremors, speech change, focal weakness, seizures and headaches.  Endo/Heme/Allergies: Negative.  Does not bruise/bleed easily.  Psychiatric/Behavioral: Negative.  Negative for depression, hallucinations and suicidal ideas.     PHYSICAL EXAMINATION:  GENERAL:  41 y.o.-year-old patient lying in the bed with no acute distress.  NECK:  Supple, no jugular venous distention. No thyroid enlargement, no tenderness.  LUNGS: Normal breath sounds bilaterally, no wheezing, rales,rhonchi  No use of accessory  muscles of respiration.  CARDIOVASCULAR: S1, S2 normal. No murmurs, rubs, or gallops.  ABDOMEN: Soft, non-tender, non-distended. Bowel sounds present. No organomegaly or mass.  EXTREMITIES: No pedal edema, cyanosis, or clubbing.  PSYCHIATRIC: The patient is alert and oriented x 3.  SKIN: No obvious rash, lesion, or ulcer.   DATA REVIEW:   CBC Recent Labs  Lab 11/13/16 0611  WBC 8.7  HGB 10.1*  HCT 31.8*  PLT 272    Chemistries  Recent Labs  Lab 11/12/16 0530 11/13/16 0611 11/14/16 0453  NA 136  --   --   K 3.0* 3.3* 3.1*  CL 97*  --   --   CO2 29  --   --   GLUCOSE 109*  --   --   BUN 19  --   --   CREATININE 0.89  --   --   CALCIUM 8.7*  --   --   MG  --  1.7  --     Cardiac Enzymes  No results for input(s): TROPONINI in the last 168 hours.  Microbiology Results  @[email or web address] @  RADIOLOGY:  Dg Abd 1 View  Result Date: 11/13/2016 CLINICAL DATA:  Ileus EXAM: ABDOMEN - 1 VIEW COMPARISON:  11/11/2016 FINDINGS: NG tube has been removed. Diffuse gaseous distention of bowel again noted but improved since prior study. No free air organomegaly. IMPRESSION: Mild diffuse gaseous distention of bowel continues but improved since prior study. Interval removal of NG tube. Electronically Signed   By: Rolm Baptise M.D.   On: 11/13/2016 08:47      Current Discharge Medication List    START taking these medications   Details  asenapine (SAPHRIS) 5 MG SUBL 24 hr tablet Place 1 tablet (5 mg total) 2 (two) times daily under the tongue. Qty: 60 tablet, Refills: 0    protein supplement shake (PREMIER PROTEIN) LIQD Take 325 mLs (11 oz total) 2 (two) times daily between meals by mouth. Qty: 19500 mL, Refills: 0      CONTINUE these medications which have CHANGED   Details  atenolol (TENORMIN) 50 MG tablet Take 1 tablet (50 mg total) daily by mouth. Qty: 30 tablet, Refills: 0    glipiZIDE (GLUCOTROL) 5 MG tablet Take 0.5 tablets (2.5 mg total) daily before breakfast by  mouth. Qty: 30 tablet, Refills: 0      CONTINUE these medications which have NOT CHANGED   Details  atorvastatin (LIPITOR) 10 MG tablet Take 10 mg by mouth daily.    divalproex (DEPAKOTE ER) 500 MG 24 hr tablet Take 2,000 mg by mouth at bedtime.     furosemide (LASIX) 20 MG tablet Take 3 tablets (60 mg total) by mouth daily. Qty: 30 tablet    latanoprost (XALATAN) 0.005 % ophthalmic solution Place 1 drop into both eyes at bedtime.    levothyroxine (SYNTHROID, LEVOTHROID) 75 MCG tablet Take 75 mcg by mouth daily.    losartan (COZAAR) 50 MG tablet Take 50 mg by mouth daily.    Multiple Vitamins-Minerals (THEREMS-M) TABS Take 1 tablet by mouth daily.    omeprazole (PRILOSEC) 20 MG capsule Take 20 mg by mouth daily.    oxybutynin (DITROPAN) 5 MG tablet Take 5 mg by mouth daily.    triamcinolone cream (KENALOG) 0.1 % Apply 1 application topically 2 (two) times daily as needed.       STOP taking these medications     clobetasol cream (TEMOVATE) 0.05 %      clonazePAM (KLONOPIN) 0.5 MG tablet      clozapine (CLOZARIL) 200 MG tablet      fluocinolone (SYNALAR) 0.01 % external solution      lithium carbonate 300 MG capsule      loratadine (CLARITIN) 10 MG tablet      meloxicam (MOBIC) 7.5 MG tablet      metFORMIN (GLUCOPHAGE) 1000 MG tablet            Management plans discussed with the patient and he is in agreement. Stable for discharge   Patient should follow up with pcp  CODE STATUS:     Code Status Orders  (From admission, onward)        Start     Ordered   11/01/16 2236  Full code  Continuous     11/01/16 2235    Code Status History    Date Active Date Inactive Code Status Order ID Comments User Context   12/20/2015 16:14 12/28/2015 19:27 Full Code [id number]  Vilinda Boehringer, MD ED   11/25/2015 10:37 11/28/2015  21:34 Full Code [id number]  Robbie Lis, MD Inpatient      TOTAL TIME TAKING CARE OF THIS PATIENT: 38 minutes.    Note: This  dictation was prepared with Dragon dictation along with smaller phrase technology. Any transcriptional errors that result from this process are unintentional.  Milus Fritze M.D on 11/14/2016 at 7:57 AM  Between 7am to 6pm - Pager - [phone] After 6pm go to [email or web address] - password [email or web address] [facility]  Office  [phone]  CC: Primary care physician; Cletis Athens, MD

## 2016-11-13 NOTE — Progress Notes (Signed)
MEDICATION RELATED CONSULT NOTE - ELECTROLYTES  Pharmacy Consult for electrolytes Indication: hypokalemia  Labs: Sodium (mmol/L)  Date Value  11/12/2016 136  04/09/2014 138   Potassium (mmol/L)  Date Value  11/13/2016 3.3 (L)  04/09/2014 4.3   Magnesium (mg/dL)  Date Value  11/13/2016 1.7   Phosphorus (mg/dL)  Date Value  11/11/2016 4.4   Calcium (mg/dL)  Date Value  11/12/2016 8.7 (L)   Calcium, Total (mg/dL)  Date Value  04/09/2014 8.4 (L)   Albumin (g/dL)  Date Value  11/01/2016 3.7  04/09/2014 3.8    Estimated Creatinine Clearance: 170.5 mL/min (by C-G formula based on SCr of 0.89 mg/dL).   Assessment: 41 yo male admitted with AMS. Pharmacy has been consulted to monitor and replace electrolytes.   Goal of Therapy:  K = 3.5 - 5  Plan:  K = 3.3, will replace with KCl 40 mEq PO once.  Will recheck electrolytes with AM labs tomorrow. Pharmacy will continue to monitor and adjust as needed.   Lenis Noon, [place] 11/13/2016,11:40 AM

## 2016-11-14 LAB — GLUCOSE, CAPILLARY
GLUCOSE-CAPILLARY: 93 mg/dL (ref 65–99)
Glucose-Capillary: 115 mg/dL — ABNORMAL HIGH (ref 65–99)

## 2016-11-14 LAB — POTASSIUM: POTASSIUM: 3.1 mmol/L — AB (ref 3.5–5.1)

## 2016-11-14 MED ORDER — POTASSIUM CHLORIDE CRYS ER 20 MEQ PO TBCR
40.0000 meq | EXTENDED_RELEASE_TABLET | ORAL | Status: AC
  Administered 2016-11-14 (×2): 40 meq via ORAL
  Filled 2016-11-14 (×2): qty 2

## 2016-11-14 MED ORDER — MAGNESIUM SULFATE 2 GM/50ML IV SOLN
2.0000 g | Freq: Once | INTRAVENOUS | Status: AC
  Administered 2016-11-14: 2 g via INTRAVENOUS
  Filled 2016-11-14: qty 50

## 2016-11-14 NOTE — Progress Notes (Signed)
Physical Therapy Treatment Patient Details Name: John [name]. MRN: [id number] DOB: Feb 17, 1975 Today's Date: 11/14/2016    History of Present Illness Pt is a 41 yo male admitted 11/01/16 with acute encephalopathy of unknown etiology, acute renal failure, and possible pneumonia. He was transferred to the stepdown unit 11/5 due to worsening encephalopathy likely secondary to hypercapnia requiring Bipap.  Assessment includes: Respiratory distress from polysubstance abuse with multiple psychiatric drugs, acute encephalopathy secondary to hypercapnia, and acute renal failure.  PMH includes OSA, asthma, personality disorder, paranoid schizophrenia, and DM.     PT Comments    Pt is making good progress towards goals with improved ambulation distance this session, still using BRW; fatigues quickly. All mobility performed on room air with sats at 98%. Good endurance with there-ex. Will continue to progress.  Follow Up Recommendations  SNF     Equipment Recommendations       Recommendations for Other Services       Precautions / Restrictions Precautions Precautions: Fall Restrictions Weight Bearing Restrictions: No    Mobility  Bed Mobility Overal bed mobility: Needs Assistance Bed Mobility: Supine to Sit     Supine to sit: Min guard     General bed mobility comments: Able to sit at EOB with decreased assist noted this date. Bed elevated prior to transfer to stand  Transfers Overall transfer level: Needs assistance Equipment used: Rolling walker (2 wheeled) Transfers: Sit to/from Stand Sit to Stand: Min guard         General transfer comment: upright posture, unsteady however able to self correct with decreased cues  Ambulation/Gait Ambulation/Gait assistance: Min guard;+2 safety/equipment Ambulation Distance (Feet): 50 Feet Assistive device: Rolling walker (2 wheeled) Gait Pattern/deviations: Step-to pattern     General Gait Details: fatigues quickly with B UE  shaking, however able to increase distance this date. BRW used with +2 for chair follow. Able to follow directions well for sequencing of RW   Stairs            Wheelchair Mobility    Modified Rankin (Stroke Patients Only)       Balance                                            Cognition Arousal/Alertness: Awake/alert Behavior During Therapy: WFL for tasks assessed/performed Overall Cognitive Status: History of cognitive impairments - at baseline                                 General Comments: talks on random topics and asks unrelated questions      Exercises Other Exercises Other Exercises: Seated ther-ex performed x 15 reps on B LE including quad sets, SAQ, LAQ, hip abd/add and hip add squeezes. All ther-ex performed with supervision and cues for correct technique    General Comments        Pertinent Vitals/Pain Pain Assessment: No/denies pain    Home Living                      Prior Function            PT Goals (current goals can now be found in the care plan section) Acute Rehab PT Goals Patient Stated Goal: to get stronger PT Goal Formulation: With patient Time For Goal Achievement: 11/21/16 Potential  to Achieve Goals: Good Progress towards PT goals: Progressing toward goals    Frequency    Min 2X/week      PT Plan Current plan remains appropriate    Co-evaluation              AM-PAC PT "6 Clicks" Daily Activity  Outcome Measure  Difficulty turning over in bed (including adjusting bedclothes, sheets and blankets)?: None Difficulty moving from lying on back to sitting on the side of the bed? : Unable Difficulty sitting down on and standing up from a chair with arms (e.g., wheelchair, bedside commode, etc,.)?: Unable Help needed moving to and from a bed to chair (including a wheelchair)?: A Little Help needed walking in hospital room?: A Little Help needed climbing 3-5 steps with a  railing? : A Lot 6 Click Score: 14    End of Session Equipment Utilized During Treatment: Gait belt Activity Tolerance: Patient tolerated treatment well Patient left: in chair;with chair alarm set Nurse Communication: Mobility status PT Visit Diagnosis: Muscle weakness (generalized) (M62.81);Difficulty in walking, not elsewhere classified (R26.2)     Time: 4656-8127 PT Time Calculation (min) (ACUTE ONLY): 23 min  Charges:  $Gait Training: 8-22 mins $Therapeutic Exercise: 8-22 mins                    G Codes:  Functional Assessment Tool Used: AM-PAC 6 Clicks Basic Mobility Functional Limitation: Mobility: Walking and moving around Mobility: Walking and Moving Around Current Status (N1700): At least 40 percent but less than 60 percent impaired, limited or restricted Mobility: Walking and Moving Around Goal Status [phone]): At least 20 percent but less than 40 percent impaired, limited or restricted    [name], PT, DPT [phone]    Aryanah Enslow 11/14/2016, 12:02 PM

## 2016-11-14 NOTE — Progress Notes (Signed)
[place] [facility] [place] NAME: John [name]    MR#:  [id number]  DATE OF BIRTH:  [birth year]  SUBJECTIVE:   No issues overnight  REVIEW OF SYSTEMS:    Review of Systems  Constitutional: Negative for fever, chills weight loss HENT: Negative for ear pain, nosebleeds, congestion, facial swelling, rhinorrhea, neck pain, neck stiffness and ear discharge.   Respiratory: Negative for cough, shortness of breath, wheezing  Cardiovascular: Negative for chest pain, palpitations and leg swelling.  Gastrointestinal: Negative for heartburn, abdominal pain, vomiting, diarrhea or consitpation Genitourinary: Negative for dysuria, urgency, frequency, hematuria Musculoskeletal: Negative for back pain or joint pain Neurological: Negative for dizziness, seizures, syncope, focal weakness,  numbness and headaches.  Hematological: Does not bruise/bleed easily.  Psychiatric/Behavioral: Negative for hallucinations, confusion, dysphoric mood    Tolerating Diet: yes      DRUG ALLERGIES:  No Known Allergies  VITALS:  Blood pressure 116/60, pulse 64, temperature 98 F (36.7 C), temperature source Oral, resp. rate 17, height 6\' 2"  (1.88 m), weight (!) 147.7 kg (325 lb 11.2 oz), SpO2 97 %.  PHYSICAL EXAMINATION:  Constitutional: Appears well-developed and well-nourished. No distress. HENT: Normocephalic. Marland Kitchen Oropharynx is clear and moist.  Eyes: Conjunctivae and EOM are normal. PERRLA, no scleral icterus.  Neck: Normal ROM. Neck supple. No JVD. No tracheal deviation. CVS: RRR, S1/S2 +, no murmurs, no gallops, no carotid bruit.  Pulmonary: Effort and breath sounds normal, no stridor, rhonchi, wheezes, rales.  Abdominal: Soft. BS +,  no distension, tenderness, rebound or guarding.  Musculoskeletal: Normal range of motion. No edema and no tenderness.  Neuro: Alert. CN 2-12 grossly intact. No focal deficits. Skin: Skin is warm and dry. No rash noted. Psychiatric:  Normal mood and affect.      LABORATORY PANEL:   CBC Recent Labs  Lab 11/13/16 0611  WBC 8.7  HGB 10.1*  HCT 31.8*  PLT 272   ------------------------------------------------------------------------------------------------------------------  Chemistries  Recent Labs  Lab 11/12/16 0530 11/13/16 0611 11/14/16 0453  NA 136  --   --   K 3.0* 3.3* 3.1*  CL 97*  --   --   CO2 29  --   --   GLUCOSE 109*  --   --   BUN 19  --   --   CREATININE 0.89  --   --   CALCIUM 8.7*  --   --   MG  --  1.7  --    ------------------------------------------------------------------------------------------------------------------  Cardiac Enzymes No results for input(s): TROPONINI in the last 168 hours. ------------------------------------------------------------------------------------------------------------------  RADIOLOGY:  Dg Abd 1 View  Result Date: 11/13/2016 CLINICAL DATA:  Ileus EXAM: ABDOMEN - 1 VIEW COMPARISON:  11/11/2016 FINDINGS: NG tube has been removed. Diffuse gaseous distention of bowel again noted but improved since prior study. No free air organomegaly. IMPRESSION: Mild diffuse gaseous distention of bowel continues but improved since prior study. Interval removal of NG tube. Electronically Signed   By: Rolm Baptise M.D.   On: 11/13/2016 08:47     ASSESSMENT AND PLAN:   41 year old male with morbid obesity. 9 schizophrenia who presented with altered mental status.  1. Acute hypoxic respiratory failure due to CO2 narcosis causing initial altered mental status Patient symptoms have resolved. He was in the ICU on BiPAP initially.  2. Paranoid schizophrenia: Patient was evaluated by psychiatrist. He will continue medications as per the recommendations.  3. Sepsis due to pneumonia with parapneumonic effusion Patient has completed course of antibiotics.  4. Abdominal distention due to ileus which has resolved  5. Hypokalemia: Potassium will be repleted.  6. Acute  kidney injury due to dehydration and sepsis which is resolved  7. Diabetes: A1c is 5.7 Recommendations for ADD diet and monitoring of blood sugars  8. Essential hypertension: Continue atenolol and losartan  9. OSA: Patient would benefit from CPAP nightly  10. Hypothyroidism: Continue Synthroid  Management plans discussed with the patient and he is in agreement.  CODE STATUS: full  TOTAL TIME TAKING CARE OF THIS PATIENT: 21 minutes.     POSSIBLE D/C today SNF, DEPENDING ON PASSR Janey Petron M.D on 11/14/2016 at 7:58 AM  Between 7am to 6pm - Pager - [phone] After 6pm go to [email or web address] - password [email or web address]  Office  [phone]  CC: Primary care physician; Cletis Athens, MD  Note: This dictation was prepared with Dragon dictation along with smaller phrase technology. Any transcriptional errors that result from this process are unintentional.

## 2016-11-14 NOTE — Clinical Social Work Note (Addendum)
CSW received [facility] number which is [phone] E expiration 12/13/16.  CSW contacted [facility] and they can accept patient today.  Patient to be d/c'ed today to [facility] room 808.  Patient and family agreeable to plans will transport via ems RN to call report to [phone].  CSW updated patient's mother and group home, patient has a Cpap, group home is going to contact CSW back about bringing it to SNF.  CSW spoke to [place] group home supervisor at [phone], she will bring CPAP to [facility] [place] also left a message with patient's mother.  Evette Cristal, MSW, [place]

## 2016-11-14 NOTE — Progress Notes (Signed)
   11/13/16 2355 11/14/16 0338  BiPAP/CPAP/SIPAP  BiPAP/CPAP/SIPAP Pt Type Adult Adult  Mask Type Full face mask Full face mask  Mask Size Large Large  Set Rate 8 breaths/min 8 breaths/min  Respiratory Rate 16 breaths/min 14 breaths/min  IPAP 18 cmH20 18 cmH20  EPAP 6 cmH2O 6 cmH2O  Oxygen Percent 30 % 30 %  Minute Ventilation 9 9.9  Leak 27 11  Peak Inspiratory Pressure (PIP) 18 17  Tidal Volume (Vt) 722 769  BiPAP/CPAP/SIPAP BiPAP BiPAP  Patient Home Equipment No No  Auto Titrate No No  Press High Alarm 25 cmH2O 25 cmH2O  Press Low Alarm 2 cmH2O 2 cmH2O

## 2016-11-14 NOTE — Progress Notes (Signed)
Patient stable.  No issues during my shift.  VSS.  See assessment for details.

## 2016-11-14 NOTE — Progress Notes (Signed)
MEDICATION RELATED CONSULT NOTE - ELECTROLYTES  Pharmacy Consult for electrolytes Indication: hypokalemia  Labs: Sodium (mmol/L)  Date Value  11/12/2016 136  04/09/2014 138   Potassium (mmol/L)  Date Value  11/14/2016 3.1 (L)  04/09/2014 4.3   Magnesium (mg/dL)  Date Value  11/13/2016 1.7   Phosphorus (mg/dL)  Date Value  11/11/2016 4.4   Calcium (mg/dL)  Date Value  11/12/2016 8.7 (L)   Calcium, Total (mg/dL)  Date Value  04/09/2014 8.4 (L)   Albumin (g/dL)  Date Value  11/01/2016 3.7  04/09/2014 3.8    Estimated Creatinine Clearance: 170.9 mL/min (by C-G formula based on SCr of 0.89 mg/dL).   Assessment: 41 yo male admitted with AMS. Pharmacy has been consulted to monitor and replace electrolytes.   Goal of Therapy:  K = 3.5 - 5  Plan:  Mg = 1.7 yesterday but K remains low despite replacement. Will give magnesium 2 g IV once.  K = 3.1, will order KCl 40 mEq PO q4h x 2 doses  Will recheck electrolytes with AM labs tomorrow.  Lenis Noon, [place] 11/14/2016,11:18 AM

## 2016-11-14 NOTE — Progress Notes (Signed)
Called report and talked to [place], RN to [place] [facility] and went over discharge instructions, patient going to room 808. Discontinue peripheral IV and telemetry monitor. Called EMS for transport.

## 2016-11-14 NOTE — Consult Note (Signed)
   [facility] CM Inpatient Consult   11/14/2016  John [name] 10-Aug-1975 [id number]   Patient screened for potential [place] Management services. Patient is on the [facility] registry as a benefit of their [employer] . Electronic medical record reveals patient's discharge plan is SNF. [facility] Care Management services not appropriate at this time. If patient's post hospital needs change please place a [facility] Care Management consult. For questions please contact:   Quaid Yeakle RN, [facility] Liaison  [phone]) Business Mobile [phone]) Toll free office

## 2016-11-15 NOTE — Clinical Social Work Note (Signed)
CSW received phone call from patient's mother that his belongings were not sent with him in the ambulance last night when he discharged.  CSW spoke to charge nurse and they have his belongings behind the nurse's station.  CSW notified patient's mother and left a message on voice mail.  CSW attempted to leave a message for [name] at [phone] from the group home however her voice mail is full.  Jones Broom. Dresser, MSW, [place]  11/15/2016 1:12 PM

## 2016-11-16 DIAGNOSIS — G9340 Encephalopathy, unspecified: Secondary | ICD-10-CM | POA: Diagnosis not present

## 2016-11-16 DIAGNOSIS — E119 Type 2 diabetes mellitus without complications: Secondary | ICD-10-CM | POA: Diagnosis not present

## 2016-11-16 DIAGNOSIS — I10 Essential (primary) hypertension: Secondary | ICD-10-CM | POA: Diagnosis not present

## 2016-11-16 DIAGNOSIS — G4730 Sleep apnea, unspecified: Secondary | ICD-10-CM | POA: Diagnosis not present

## 2016-11-23 DIAGNOSIS — J45909 Unspecified asthma, uncomplicated: Secondary | ICD-10-CM | POA: Diagnosis not present

## 2016-11-23 DIAGNOSIS — I10 Essential (primary) hypertension: Secondary | ICD-10-CM | POA: Diagnosis not present

## 2016-11-23 DIAGNOSIS — F201 Disorganized schizophrenia: Secondary | ICD-10-CM | POA: Diagnosis not present

## 2016-11-23 DIAGNOSIS — F319 Bipolar disorder, unspecified: Secondary | ICD-10-CM | POA: Diagnosis not present

## 2016-11-29 DIAGNOSIS — L309 Dermatitis, unspecified: Secondary | ICD-10-CM | POA: Diagnosis not present

## 2016-11-29 DIAGNOSIS — E119 Type 2 diabetes mellitus without complications: Secondary | ICD-10-CM | POA: Diagnosis not present

## 2016-11-29 DIAGNOSIS — K210 Gastro-esophageal reflux disease with esophagitis: Secondary | ICD-10-CM | POA: Diagnosis not present

## 2016-11-29 DIAGNOSIS — G4736 Sleep related hypoventilation in conditions classified elsewhere: Secondary | ICD-10-CM | POA: Diagnosis not present

## 2016-11-29 DIAGNOSIS — M7989 Other specified soft tissue disorders: Secondary | ICD-10-CM | POA: Diagnosis not present

## 2016-11-30 DIAGNOSIS — M7989 Other specified soft tissue disorders: Secondary | ICD-10-CM | POA: Diagnosis not present

## 2016-11-30 DIAGNOSIS — I10 Essential (primary) hypertension: Secondary | ICD-10-CM | POA: Diagnosis not present

## 2016-11-30 DIAGNOSIS — E038 Other specified hypothyroidism: Secondary | ICD-10-CM | POA: Diagnosis not present

## 2016-11-30 DIAGNOSIS — R918 Other nonspecific abnormal finding of lung field: Secondary | ICD-10-CM | POA: Diagnosis not present

## 2016-11-30 DIAGNOSIS — E1165 Type 2 diabetes mellitus with hyperglycemia: Secondary | ICD-10-CM | POA: Diagnosis not present

## 2016-11-30 DIAGNOSIS — R5381 Other malaise: Secondary | ICD-10-CM | POA: Diagnosis not present

## 2016-11-30 DIAGNOSIS — E7849 Other hyperlipidemia: Secondary | ICD-10-CM | POA: Diagnosis not present

## 2016-11-30 DIAGNOSIS — E119 Type 2 diabetes mellitus without complications: Secondary | ICD-10-CM | POA: Diagnosis not present

## 2016-12-03 DIAGNOSIS — G4736 Sleep related hypoventilation in conditions classified elsewhere: Secondary | ICD-10-CM | POA: Diagnosis not present

## 2016-12-03 DIAGNOSIS — L309 Dermatitis, unspecified: Secondary | ICD-10-CM | POA: Diagnosis not present

## 2016-12-03 DIAGNOSIS — E119 Type 2 diabetes mellitus without complications: Secondary | ICD-10-CM | POA: Diagnosis not present

## 2016-12-03 DIAGNOSIS — R0689 Other abnormalities of breathing: Secondary | ICD-10-CM | POA: Diagnosis not present

## 2016-12-17 DIAGNOSIS — G4733 Obstructive sleep apnea (adult) (pediatric): Secondary | ICD-10-CM | POA: Diagnosis not present

## 2016-12-17 DIAGNOSIS — Z9989 Dependence on other enabling machines and devices: Secondary | ICD-10-CM | POA: Diagnosis not present

## 2016-12-17 DIAGNOSIS — E038 Other specified hypothyroidism: Secondary | ICD-10-CM | POA: Diagnosis not present

## 2016-12-17 DIAGNOSIS — R918 Other nonspecific abnormal finding of lung field: Secondary | ICD-10-CM | POA: Diagnosis not present

## 2017-01-04 ENCOUNTER — Other Ambulatory Visit: Payer: Self-pay

## 2017-01-04 ENCOUNTER — Emergency Department
Admission: EM | Admit: 2017-01-04 | Discharge: 2017-01-04 | Disposition: A | Discharge status: Home / Self Care | Payer: 59 | Attending: Emergency Medicine | Admitting: Emergency Medicine

## 2017-01-04 ENCOUNTER — Encounter: Payer: Self-pay | Admitting: Emergency Medicine

## 2017-01-04 DIAGNOSIS — J45909 Unspecified asthma, uncomplicated: Secondary | ICD-10-CM | POA: Diagnosis not present

## 2017-01-04 DIAGNOSIS — Z79899 Other long term (current) drug therapy: Secondary | ICD-10-CM | POA: Insufficient documentation

## 2017-01-04 DIAGNOSIS — Z87891 Personal history of nicotine dependence: Secondary | ICD-10-CM | POA: Diagnosis not present

## 2017-01-04 DIAGNOSIS — E039 Hypothyroidism, unspecified: Secondary | ICD-10-CM | POA: Insufficient documentation

## 2017-01-04 DIAGNOSIS — M722 Plantar fascial fibromatosis: Secondary | ICD-10-CM

## 2017-01-04 DIAGNOSIS — R55 Syncope and collapse: Secondary | ICD-10-CM

## 2017-01-04 DIAGNOSIS — Z7984 Long term (current) use of oral hypoglycemic drugs: Secondary | ICD-10-CM | POA: Insufficient documentation

## 2017-01-04 DIAGNOSIS — E119 Type 2 diabetes mellitus without complications: Secondary | ICD-10-CM | POA: Insufficient documentation

## 2017-01-04 LAB — CBC
HCT: 37.2 % — ABNORMAL LOW (ref 40.0–52.0)
HEMOGLOBIN: 12.2 g/dL — AB (ref 13.0–18.0)
MCH: 27.3 pg (ref 26.0–34.0)
MCHC: 32.8 g/dL (ref 32.0–36.0)
MCV: 83.1 fL (ref 80.0–100.0)
Platelets: 214 10*3/uL (ref 150–440)
RBC: 4.48 MIL/uL (ref 4.40–5.90)
RDW: 15.1 % — ABNORMAL HIGH (ref 11.5–14.5)
WBC: 6.1 10*3/uL (ref 3.8–10.6)

## 2017-01-04 LAB — BASIC METABOLIC PANEL
ANION GAP: 9 (ref 5–15)
BUN: 15 mg/dL (ref 6–20)
CO2: 33 mmol/L — ABNORMAL HIGH (ref 22–32)
Calcium: 9.3 mg/dL (ref 8.9–10.3)
Chloride: 96 mmol/L — ABNORMAL LOW (ref 101–111)
Creatinine, Ser: 1.03 mg/dL (ref 0.61–1.24)
GFR calc non Af Amer: >60 mL/min (ref >60)
GLUCOSE: 94 mg/dL (ref 65–99)
Potassium: 3.7 mmol/L (ref 3.5–5.1)
Sodium: 138 mmol/L (ref 135–145)

## 2017-01-04 LAB — GLUCOSE, CAPILLARY: Glucose-Capillary: 87 mg/dL (ref 65–99)

## 2017-01-04 MED ORDER — SODIUM CHLORIDE 0.9 % IV BOLUS (SEPSIS): 1000.0000 mL | Freq: Once | INTRAVENOUS | Status: DC

## 2017-01-04 MED ORDER — IBUPROFEN 600 MG PO TABS: 600.0000 mg | ORAL_TABLET | Freq: Four times a day (QID) | ORAL | 0 refills | Status: DC | PRN

## 2017-01-04 MED ORDER — IBUPROFEN 600 MG PO TABS: 600.0000 mg | ORAL_TABLET | Freq: Once | ORAL | Status: DC

## 2017-01-04 NOTE — ED Notes (Signed)
Pt given coke and meal tray per patient request and MD instruction. Per MD pt is to eat then to be ambulated to see if he has repeat symptoms of light-headedness and dizziness.

## 2017-01-04 NOTE — ED Notes (Signed)
NAD noted at time of D/C. Pt denies questions or concerns. Pt ambulatory to the lobby at this time. Pt D/C into the care of his group home owner Debroah Baller. Caregiver denies any comments/concerns at this time.

## 2017-01-04 NOTE — ED Provider Notes (Signed)
[facility] Emergency Department Provider Note  ____________________________________________   First MD Initiated Contact with Patient 01/04/17 1256     (approximate)  I have reviewed the triage vital signs and the nursing notes.   HISTORY  Chief Complaint Near Syncope   HPI John Peggs. is a 42 y.o. male with a history of diabetes as well as anemia and schizophrenia from a group home who is presenting to the emergency department with lightheadedness.  He says that he did not eat or drink anything yesterday because he was angry 1 of the group home workers.  He says that he ate breakfast this morning and is feeling better but prior to breakfast this morning had feelings of lightheadedness and a sensation that he was going to pass out.  He denies any pain.  Denies any dizzy or spinning sensation.  Past Medical History:  Diagnosis Date  . Anemia   . Asthma   . Diabetes mellitus   . Morbid obesity ([place])   . Paranoid schizophrenia ([place])   . Personality disorder ([place])   . Sleep apnea   . Thyroid disease     Patient Active Problem List   Diagnosis Date Noted  . Encephalopathy acute   . Somnolence 11/01/2016  . AKI (acute kidney injury) ([place]) 11/01/2016  . Sepsis ([place]) 04/27/2016  . Respiratory failure ([place]) 12/20/2015  . Cardiomyopathy ([place]) 11/28/2015  . Tobacco abuse 11/28/2015  . Acute respiratory failure with hypercapnia ([place])   . OSA (obstructive sleep apnea)   . Essential hypertension 11/25/2015  . Hypothyroidism 11/25/2015  . Paranoid schizophrenia, chronic condition ([place]) 11/25/2015  . Acute respiratory failure ([place])   . Acute exacerbation of COPD with asthma ([place])   . Dyslipidemia associated with type 2 diabetes mellitus ([place])   . Diabetes mellitus due to underlying condition without complication, without long-term current use of insulin ([place])   . Morbid obesity ([place]) 09/29/2008    Past Surgical History:  Procedure Laterality Date    . FRACTURE SURGERY      Prior to Admission medications   Medication Sig Start Date End Date Taking? Authorizing Provider  asenapine (SAPHRIS) 5 MG SUBL 24 hr tablet Place 1 tablet (5 mg total) 2 (two) times daily under the tongue. Patient taking differently: Place 10 mg under the tongue 2 (two) times daily.  11/13/16  Yes Bettey Costa, MD  atenolol (TENORMIN) 50 MG tablet Take 1 tablet (50 mg total) daily by mouth. 11/14/16  Yes Mody, Sital, MD  atorvastatin (LIPITOR) 10 MG tablet Take 10 mg by mouth daily.   Yes [provider]  cloZAPine (CLOZARIL) 100 MG tablet Take 100 mg by mouth daily.   Yes [provider]  divalproex (DEPAKOTE ER) 500 MG 24 hr tablet Take 2,000 mg by mouth at bedtime.    Yes [provider]  furosemide (LASIX) 20 MG tablet Take 3 tablets (60 mg total) by mouth daily. 04/28/16  Yes Hillary Bow, MD  glipiZIDE (GLUCOTROL) 5 MG tablet Take 0.5 tablets (2.5 mg total) daily before breakfast by mouth. 11/13/16  Yes Mody, Sital, MD  haloperidol (HALDOL) 5 MG tablet Take 5 mg by mouth every 6 (six) hours as needed for agitation.   Yes [provider]  latanoprost (XALATAN) 0.005 % ophthalmic solution Place 1 drop into both eyes at bedtime.   Yes [provider]  levothyroxine (SYNTHROID, LEVOTHROID) 75 MCG tablet Take 75 mcg by mouth daily.   Yes [provider]  losartan (COZAAR) 50 MG tablet Take 50 mg by mouth daily.   Yes [provider]  omeprazole (PRILOSEC) 20 MG capsule Take 20 mg by mouth daily.   Yes [provider]  oxybutynin (DITROPAN) 5 MG tablet Take 5 mg by mouth daily.   Yes [provider]  triamcinolone cream (KENALOG) 0.1 % Apply 1 application topically 2 (two) times daily as needed.    Yes [provider]    Allergies Patient has no known allergies.  Family History  Problem Relation Age of Onset  . Glaucoma Mother     Social History Social History    Tobacco Use  . Smoking status: Former Research scientist (life sciences)  . Smokeless tobacco: Never Used  Substance Use Topics  . Alcohol use: Yes    Comment: only once in a while  . Drug use: No    Review of Systems  Constitutional: No fever/chills Eyes: No visual changes. ENT: No sore throat. Cardiovascular: Denies chest pain. Respiratory: Denies shortness of breath. Gastrointestinal: No abdominal pain.  No nausea, no vomiting.  No diarrhea.  No constipation. Genitourinary: Negative for dysuria. Musculoskeletal: Negative for back pain. Skin: Negative for rash. Neurological: Negative for headaches, focal weakness or numbness.   ____________________________________________   PHYSICAL EXAM:  VITAL SIGNS: ED Triage Vitals  Enc Vitals Group     BP 01/04/17 1127 98/63     Pulse Rate 01/04/17 1127 98     Resp 01/04/17 1127 18     Temp 01/04/17 1127 98 F (36.7 C)     Temp Source 01/04/17 1127 Oral     SpO2 01/04/17 1127 98 %     Weight 01/04/17 1128 300 lb (136.1 kg)     Height 01/04/17 1128 6\' 3"  (1.905 m)     Head Circumference --      Peak Flow --      Pain Score 01/04/17 1127 0     Pain Loc --      Pain Edu? --      Excl. in [place]? --     Constitutional: Alert and oriented. Well appearing and in no acute distress. Eyes: Conjunctivae are normal.  Head: Atraumatic. Nose: No congestion/rhinnorhea. Mouth/Throat: Mucous membranes are moist.  Neck: No stridor.   Cardiovascular: Normal rate, regular rhythm. Grossly normal heart sounds.  Respiratory: Normal respiratory effort.  No retractions. Lungs CTAB. Gastrointestinal: Soft and nontender. No distention.  Musculoskeletal: No lower extremity tenderness nor edema.  No joint effusions. Neurologic:  Normal speech and language. No gross focal neurologic deficits are appreciated. Skin:  Skin is warm, dry and intact. No rash noted. Psychiatric: Mood and affect are normal. Speech and behavior are  normal.  ____________________________________________   LABS (all labs ordered are listed, but only abnormal results are displayed)  Labs Reviewed  BASIC METABOLIC PANEL - Abnormal; Notable for the following components:      Result Value   Chloride 96 (*)    CO2 33 (*)    All other components within normal limits  CBC - Abnormal; Notable for the following components:   Hemoglobin 12.2 (*)    HCT 37.2 (*)    RDW 15.1 (*)    All other components within normal limits  GLUCOSE, CAPILLARY  URINALYSIS, COMPLETE (UACMP) WITH MICROSCOPIC  CBC WITH DIFFERENTIAL/PLATELET  CBG MONITORING, ED   ____________________________________________  EKG  ED ECG REPORT I, Doran Stabler, the attending physician, personally viewed and interpreted this ECG.   Date: 01/04/2017  EKG Time: 1136  Rate: 97  Rhythm: normal sinus rhythm  Axis: Normal  Intervals:none  ST&T Change: No ST segment elevation or depression.  No abnormal T wave inversion.  ____________________________________________  RADIOLOGY   ____________________________________________   PROCEDURES  Procedure(s) performed:   Procedures  Critical Care performed:   ____________________________________________   INITIAL IMPRESSION / ASSESSMENT AND PLAN / ED COURSE  Pertinent labs & imaging results that were available during my care of the patient were reviewed by me and considered in my medical decision making (see chart for details).  DDX: Near-syncope, dehydration, anemia, hypoglycemia As part of my medical decision making, I reviewed the following data within the [place] Notes from prior ED visits     ----------------------------------------- 3:23 PM on 01/04/2017 -----------------------------------------  Patient with reassuring lab work as well as EKG.  Patient without any lightheadedness and asymptomatic after eating a sandwich in the emergency department.  Attempted to walk patient but  he is saying that his feet feel tender when he walks.  He describes this feeling in the bottom of his feet and says that has been going on over the past 3 days.  There is no edema palpable to the bilateral lower extremities.  No erythema.  There is no tenderness to palpation along the plantar fascia.  Patient does not report injury.  Will prescribe the patient ibuprofen for symptomatic relief.  Possible mild case of plantar fasciitis.  ____________________________________________   FINAL CLINICAL IMPRESSION(S) / ED DIAGNOSES  Near syncope.  Plantar fasciitis.   NEW MEDICATIONS STARTED DURING THIS VISIT:  This SmartLink is deprecated. Use AVSMEDLIST instead to display the medication list for a patient.   Note:  This document was prepared using Dragon voice recognition software and may include unintentional dictation errors.     Orbie Pyo, MD 01/04/17 [phone]

## 2017-01-04 NOTE — ED Triage Notes (Addendum)
Pt arrived via EMS from [place] states he was fasting yesterday and today c/o dizziness and lightheadedness today.  Pt denies any sweating or pain.  Pt is alert and oriented to situation, place and person.  Pt states he took all of his daily medications and ate breakfast this morning.

## 2017-01-04 NOTE — ED Triage Notes (Addendum)
FIRST NURSE NOTE-from group home for dizziness.  Pt fasted yesterday. bg 133 with ems.  98/67 with EMS.  Also has right foot pain

## 2017-01-11 DIAGNOSIS — F209 Schizophrenia, unspecified: Secondary | ICD-10-CM | POA: Diagnosis not present

## 2017-02-13 DIAGNOSIS — R918 Other nonspecific abnormal finding of lung field: Secondary | ICD-10-CM | POA: Diagnosis not present

## 2017-02-13 DIAGNOSIS — B368 Other specified superficial mycoses: Secondary | ICD-10-CM | POA: Diagnosis not present

## 2017-02-13 DIAGNOSIS — E1165 Type 2 diabetes mellitus with hyperglycemia: Secondary | ICD-10-CM | POA: Diagnosis not present

## 2017-02-13 DIAGNOSIS — E038 Other specified hypothyroidism: Secondary | ICD-10-CM | POA: Diagnosis not present

## 2017-03-06 DIAGNOSIS — F250 Schizoaffective disorder, bipolar type: Secondary | ICD-10-CM | POA: Diagnosis not present

## 2017-03-18 DIAGNOSIS — R0689 Other abnormalities of breathing: Secondary | ICD-10-CM | POA: Diagnosis not present

## 2017-03-18 DIAGNOSIS — E1165 Type 2 diabetes mellitus with hyperglycemia: Secondary | ICD-10-CM | POA: Diagnosis not present

## 2017-03-18 DIAGNOSIS — E038 Other specified hypothyroidism: Secondary | ICD-10-CM | POA: Diagnosis not present

## 2017-03-18 DIAGNOSIS — B368 Other specified superficial mycoses: Secondary | ICD-10-CM | POA: Diagnosis not present

## 2017-06-03 DIAGNOSIS — B368 Other specified superficial mycoses: Secondary | ICD-10-CM | POA: Diagnosis not present

## 2017-06-03 DIAGNOSIS — E038 Other specified hypothyroidism: Secondary | ICD-10-CM | POA: Diagnosis not present

## 2017-06-03 DIAGNOSIS — E1165 Type 2 diabetes mellitus with hyperglycemia: Secondary | ICD-10-CM | POA: Diagnosis not present

## 2017-06-03 DIAGNOSIS — L309 Dermatitis, unspecified: Secondary | ICD-10-CM | POA: Diagnosis not present

## 2017-07-07 ENCOUNTER — Emergency Department: Payer: 59

## 2017-07-07 ENCOUNTER — Other Ambulatory Visit: Payer: Self-pay

## 2017-07-07 ENCOUNTER — Encounter: Payer: Self-pay | Admitting: Emergency Medicine

## 2017-07-07 ENCOUNTER — Emergency Department
Admission: EM | Admit: 2017-07-07 | Discharge: 2017-07-07 | Disposition: A | Discharge status: Home / Self Care | Payer: 59 | Attending: Emergency Medicine | Admitting: Emergency Medicine

## 2017-07-07 DIAGNOSIS — Z79899 Other long term (current) drug therapy: Secondary | ICD-10-CM | POA: Diagnosis not present

## 2017-07-07 DIAGNOSIS — J45998 Other asthma: Secondary | ICD-10-CM | POA: Diagnosis not present

## 2017-07-07 DIAGNOSIS — R059 Cough, unspecified: Secondary | ICD-10-CM

## 2017-07-07 DIAGNOSIS — R079 Chest pain, unspecified: Secondary | ICD-10-CM | POA: Insufficient documentation

## 2017-07-07 DIAGNOSIS — E1165 Type 2 diabetes mellitus with hyperglycemia: Secondary | ICD-10-CM | POA: Insufficient documentation

## 2017-07-07 DIAGNOSIS — R05 Cough: Secondary | ICD-10-CM | POA: Insufficient documentation

## 2017-07-07 DIAGNOSIS — Z7984 Long term (current) use of oral hypoglycemic drugs: Secondary | ICD-10-CM | POA: Diagnosis not present

## 2017-07-07 DIAGNOSIS — R739 Hyperglycemia, unspecified: Secondary | ICD-10-CM

## 2017-07-07 DIAGNOSIS — Z87891 Personal history of nicotine dependence: Secondary | ICD-10-CM | POA: Insufficient documentation

## 2017-07-07 LAB — BASIC METABOLIC PANEL
ANION GAP: 9 (ref 5–15)
BUN: 12 mg/dL (ref 6–20)
CALCIUM: 8.9 mg/dL (ref 8.9–10.3)
CO2: 30 mmol/L (ref 22–32)
CREATININE: 1.1 mg/dL (ref 0.61–1.24)
Chloride: 96 mmol/L — ABNORMAL LOW (ref 98–111)
Glucose, Bld: 402 mg/dL — ABNORMAL HIGH (ref 70–99)
Potassium: 4 mmol/L (ref 3.5–5.1)
SODIUM: 135 mmol/L (ref 135–145)

## 2017-07-07 LAB — CBC
HCT: 37 % — ABNORMAL LOW (ref 40.0–52.0)
HEMOGLOBIN: 12.1 g/dL — AB (ref 13.0–18.0)
MCH: 28 pg (ref 26.0–34.0)
MCHC: 32.9 g/dL (ref 32.0–36.0)
MCV: 85.1 fL (ref 80.0–100.0)
PLATELETS: 183 10*3/uL (ref 150–440)
RBC: 4.34 MIL/uL — AB (ref 4.40–5.90)
RDW: 13.7 % (ref 11.5–14.5)
WBC: 5.2 10*3/uL (ref 3.8–10.6)

## 2017-07-07 LAB — GLUCOSE, CAPILLARY
GLUCOSE-CAPILLARY: 360 mg/dL — AB (ref 70–99)
GLUCOSE-CAPILLARY: 276 mg/dL — AB (ref 70–99)

## 2017-07-07 LAB — TROPONIN I: Troponin I: <0.03 ng/mL (ref <0.03)

## 2017-07-07 MED ORDER — SODIUM CHLORIDE 0.9 % IV BOLUS
1000.0000 mL | Freq: Once | INTRAVENOUS | Status: AC
  Administered 2017-07-07: 1000 mL via INTRAVENOUS

## 2017-07-07 MED ORDER — SODIUM CHLORIDE 0.9 % IV BOLUS
1000.0000 mL | Freq: Once | INTRAVENOUS | Status: AC
Start: 2017-07-07 — End: 2017-07-07
  Administered 2017-07-07: 1000 mL via INTRAVENOUS

## 2017-07-07 MED ORDER — METFORMIN HCL 500 MG PO TABS: 500.0000 mg | ORAL_TABLET | Freq: Two times a day (BID) | ORAL | 0 refills | Status: DC

## 2017-07-07 MED ORDER — KETOROLAC TROMETHAMINE 30 MG/ML IJ SOLN
30.0000 mg | Freq: Once | INTRAMUSCULAR | Status: AC
  Administered 2017-07-07: 30 mg via INTRAVENOUS
  Filled 2017-07-07: qty 1

## 2017-07-07 NOTE — ED Triage Notes (Addendum)
Pt arrived via EMS from [place] with c/o cough x 2 months.  Pt states the cough is non-productive. Pt also c/o back pain when laying down. Pt also c/o chest pain.

## 2017-07-07 NOTE — ED Notes (Signed)
Renea from [facility] contacted and advised pt is ready for d/c. She advised she is coming here to transport pt.

## 2017-07-07 NOTE — ED Notes (Signed)
Left message for mother to call back who pt identified as the legal guardian [place].

## 2017-07-07 NOTE — ED Notes (Signed)
Date and time results received: 07/07/17 1542   Test: Blood Glucose Critical Value: 276 mg/dL  Name of Provider Notified: Dr. Cherylann Banas

## 2017-07-07 NOTE — ED Notes (Signed)
Date and time results received: 07/07/17 1340   Test: Blood Glucose Critical Value: 360 mg/dL  Name of Provider Notified: Dr. Cherylann Banas  Orders Received? Or Actions Taken?: Yes, additional fluids ordered

## 2017-07-07 NOTE — Discharge Instructions (Addendum)
We have added on an additional medication (metformin) to be taken to help bring the sugar down.  We have prescribed a one-month supply, but John [name] should follow-up with his doctor in the next few weeks to assess if this medication needs to be prescribed further.  John [name] should return to the ER for new, worsening, or persistent severe cough, difficulty breathing, chest pain, fever, or high blood sugar readings.

## 2017-07-07 NOTE — ED Provider Notes (Signed)
[facility] Emergency Department Provider Note ____________________________________________   First MD Initiated Contact with Patient 07/07/17 1110     (approximate)  I have reviewed the triage vital signs and the nursing notes.   HISTORY  Chief Complaint Chest Pain; Cough; and Back Pain    HPI John [name]. is a 42 y.o. male with PMH as noted below who presents with cough which has been chronic for 2 months, nonproductive, and not associated with fever.  Patient reports chest pain in the left side of the chest when he coughs which is sharp and has also been going on for some time.  Patient also states that his sugars have been high recently, in the 300s.  He used to be on metformin but states that he has not taken it for some time since he ran out.  Past Medical History:  Diagnosis Date  . Anemia   . Asthma   . Diabetes mellitus   . Morbid obesity ([place])   . Paranoid schizophrenia ([place])   . Personality disorder ([place])   . Sleep apnea   . Thyroid disease     Patient Active Problem List   Diagnosis Date Noted  . Encephalopathy acute   . Somnolence 11/01/2016  . AKI (acute kidney injury) ([place]) 11/01/2016  . Sepsis ([place]) 04/27/2016  . Respiratory failure ([place]) 12/20/2015  . Cardiomyopathy ([place]) 11/28/2015  . Tobacco abuse 11/28/2015  . Acute respiratory failure with hypercapnia ([place])   . OSA (obstructive sleep apnea)   . Essential hypertension 11/25/2015  . Hypothyroidism 11/25/2015  . Paranoid schizophrenia, chronic condition ([place]) 11/25/2015  . Acute respiratory failure ([place])   . Acute exacerbation of COPD with asthma ([place])   . Dyslipidemia associated with type 2 diabetes mellitus ([place])   . Diabetes mellitus due to underlying condition without complication, without long-term current use of insulin ([place])   . Morbid obesity ([place]) 09/29/2008    Past Surgical History:  Procedure Laterality Date  . FRACTURE SURGERY      Prior to Admission  medications   Medication Sig Start Date End Date Taking? Authorizing Provider  asenapine (SAPHRIS) 5 MG SUBL 24 hr tablet Place 1 tablet (5 mg total) 2 (two) times daily under the tongue. Patient taking differently: Place 10 mg under the tongue 2 (two) times daily.  11/13/16   Bettey Costa, MD  atenolol (TENORMIN) 50 MG tablet Take 1 tablet (50 mg total) daily by mouth. 11/14/16   Bettey Costa, MD  atorvastatin (LIPITOR) 10 MG tablet Take 10 mg by mouth daily.    [provider]  cloZAPine (CLOZARIL) 100 MG tablet Take 100 mg by mouth daily.    [provider]  divalproex (DEPAKOTE ER) 500 MG 24 hr tablet Take 2,000 mg by mouth at bedtime.     [provider]  furosemide (LASIX) 20 MG tablet Take 3 tablets (60 mg total) by mouth daily. 04/28/16   Hillary Bow, MD  glipiZIDE (GLUCOTROL) 5 MG tablet Take 0.5 tablets (2.5 mg total) daily before breakfast by mouth. 11/13/16   Bettey Costa, MD  haloperidol (HALDOL) 5 MG tablet Take 5 mg by mouth every 6 (six) hours as needed for agitation.    [provider]  ibuprofen (ADVIL,MOTRIN) 600 MG tablet Take 1 tablet (600 mg total) by mouth every 6 (six) hours as needed (foot pain). 01/04/17   Schaevitz, Randall An, MD  latanoprost (XALATAN) 0.005 % ophthalmic solution Place 1 drop into both eyes at bedtime.  [provider]  levothyroxine (SYNTHROID, LEVOTHROID) 75 MCG tablet Take 75 mcg by mouth daily.    [provider]  losartan (COZAAR) 50 MG tablet Take 50 mg by mouth daily.    [provider]  metFORMIN (GLUCOPHAGE) 500 MG tablet Take 1 tablet (500 mg total) by mouth 2 (two) times daily with a meal. 07/07/17 08/06/17  Arta Silence, MD  omeprazole (PRILOSEC) 20 MG capsule Take 20 mg by mouth daily.    [provider]  oxybutynin (DITROPAN) 5 MG tablet Take 5 mg by mouth daily.    [provider]  triamcinolone cream (KENALOG) 0.1 % Apply 1 application topically 2 (two)  times daily as needed.     [provider]    Allergies Patient has no known allergies.  Family History  Problem Relation Age of Onset  . Glaucoma Mother     Social History Social History   Tobacco Use  . Smoking status: Former Research scientist (life sciences)  . Smokeless tobacco: Never Used  Substance Use Topics  . Alcohol use: Yes    Comment: only once in a while  . Drug use: No    Review of Systems  Constitutional: No fever. Eyes: No redness. ENT: No sore throat. Cardiovascular: Positive for chest pain. Respiratory: Denies shortness of breath. Gastrointestinal: No vomiting. Genitourinary: Negative for dysuria.  Musculoskeletal: Negative for back pain. Skin: Negative for rash. Neurological: Negative for headache.   ____________________________________________   PHYSICAL EXAM:  VITAL SIGNS: ED Triage Vitals  Enc Vitals Group     BP 07/07/17 1037 118/65     Pulse Rate 07/07/17 1037 95     Resp 07/07/17 1037 20     Temp 07/07/17 1037 (!) 97.5 F (36.4 C)     Temp Source 07/07/17 1037 Oral     SpO2 07/07/17 1037 94 %     Weight 07/07/17 1041 (!) 323 lb (146.5 kg)     Height 07/07/17 1041 6\' 3"  (1.905 m)     Head Circumference --      Peak Flow --      Pain Score 07/07/17 1041 8     Pain Loc --      Pain Edu? --      Excl. in [place]? --     Constitutional: Alert and oriented.  Relatively comfortable appearing and in no acute distress. Eyes: Conjunctivae are normal.  EOMI. Head: Atraumatic. Nose: No congestion/rhinnorhea. Mouth/Throat: Mucous membranes are moist.   Neck: Normal range of motion.  Cardiovascular: Normal rate, regular rhythm. Grossly normal heart sounds.  Good peripheral circulation. Respiratory: Normal respiratory effort.  No retractions. Lungs CTAB. Gastrointestinal: Soft and nontender. No distention.  Genitourinary: No flank tenderness. Musculoskeletal: No lower extremity edema.  Extremities warm and well perfused.  Neurologic:  Normal speech and  language. No gross focal neurologic deficits are appreciated.  Skin:  Skin is warm and dry. No rash noted. Psychiatric: Mood and affect are normal. Speech and behavior are normal.  ____________________________________________   LABS (all labs ordered are listed, but only abnormal results are displayed)  Labs Reviewed  BASIC METABOLIC PANEL - Abnormal; Notable for the following components:      Result Value   Chloride 96 (*)    Glucose, Bld 402 (*)    All other components within normal limits  CBC - Abnormal; Notable for the following components:   RBC 4.34 (*)    Hemoglobin 12.1 (*)    HCT 37.0 (*)    All other components  within normal limits  GLUCOSE, CAPILLARY - Abnormal; Notable for the following components:   Glucose-Capillary 360 (*)    All other components within normal limits  GLUCOSE, CAPILLARY - Abnormal; Notable for the following components:   Glucose-Capillary 276 (*)    All other components within normal limits  TROPONIN I  CBG MONITORING, ED  CBG MONITORING, ED   ____________________________________________  EKG  ED ECG REPORT I, Arta Silence, the attending physician, personally viewed and interpreted this ECG.  Date: 07/07/2017 EKG Time: 1051 Rate: 94 Rhythm: normal sinus rhythm QRS Axis: normal Intervals: normal ST/T Wave abnormalities: normal Narrative Interpretation: no evidence of acute ischemia  ____________________________________________  RADIOLOGY  CXR: No focal infiltrate or other acute findings  ____________________________________________   PROCEDURES  Procedure(s) performed: No  Procedures  Critical Care performed: No ____________________________________________   INITIAL IMPRESSION / ASSESSMENT AND PLAN / ED COURSE  Pertinent labs & imaging results that were available during my care of the patient were reviewed by me and considered in my medical decision making (see chart for details).  42 year old male with PMH as  noted above presents with chronic cough for the last few months, associated with sharp chest pain that is also been going on at least for several weeks.  The patient also reports high sugar readings in the high 200s and low 300s.  On exam, the patient is relatively comfortable appearing, vital signs are normal, and the remainder of the exam is as described above.  Differential includes bronchitis,, less likely new onset COPD, or possibly medication side effect.  I suspect most likely musculoskeletal pain related to the cough.  There is no evidence of ACS given the association of the pain with a cough, and the time course.  Plan: Chest x-ray, basic labs, troponin x1, fluids, analgesia, reassess.  ----------------------------------------- 16:00 PM on 07/07/2017 -----------------------------------------  Glucose significantly improved after fluids.  The chest x-ray did not show any acute findings.  I prescribed the patient a month supply of metformin to help with better glucose control.  He was previously on it and is no longer taking it however he and his guardian do not know or remember why this was.  I instructed them to follow-up with his primary care doctor to discuss his diabetes management in the future.  Return precautions given, the patient and guardian expressed understanding.   ____________________________________________   FINAL CLINICAL IMPRESSION(S) / ED DIAGNOSES  Final diagnoses:  Cough  Hyperglycemia      NEW MEDICATIONS STARTED DURING THIS VISIT:  Discharge Medication List as of 07/07/2017  4:45 PM    START taking these medications   Details  metFORMIN (GLUCOPHAGE) 500 MG tablet Take 1 tablet (500 mg total) by mouth 2 (two) times daily with a meal., Starting Sun 07/07/2017, Until Tue 08/06/2017, Print         Note:  This document was prepared using Dragon voice recognition software and may include unintentional dictation errors.    Arta Silence, MD 07/07/17  2101

## 2017-07-13 ENCOUNTER — Emergency Department
Admission: EM | Admit: 2017-07-13 | Discharge: 2017-07-13 | Disposition: A | Discharge status: Home / Self Care | Payer: 59 | Attending: Emergency Medicine | Admitting: Emergency Medicine

## 2017-07-13 ENCOUNTER — Emergency Department: Payer: 59

## 2017-07-13 DIAGNOSIS — Z79899 Other long term (current) drug therapy: Secondary | ICD-10-CM | POA: Insufficient documentation

## 2017-07-13 DIAGNOSIS — R739 Hyperglycemia, unspecified: Secondary | ICD-10-CM

## 2017-07-13 DIAGNOSIS — E119 Type 2 diabetes mellitus without complications: Secondary | ICD-10-CM | POA: Diagnosis not present

## 2017-07-13 DIAGNOSIS — Z7984 Long term (current) use of oral hypoglycemic drugs: Secondary | ICD-10-CM | POA: Diagnosis not present

## 2017-07-13 DIAGNOSIS — E039 Hypothyroidism, unspecified: Secondary | ICD-10-CM | POA: Insufficient documentation

## 2017-07-13 DIAGNOSIS — J45909 Unspecified asthma, uncomplicated: Secondary | ICD-10-CM | POA: Insufficient documentation

## 2017-07-13 DIAGNOSIS — Z87891 Personal history of nicotine dependence: Secondary | ICD-10-CM | POA: Diagnosis not present

## 2017-07-13 LAB — COMPREHENSIVE METABOLIC PANEL
ALT: 16 U/L (ref 0–44)
AST: 21 U/L (ref 15–41)
Albumin: 3.6 g/dL (ref 3.5–5.0)
Alkaline Phosphatase: 58 U/L (ref 38–126)
Anion gap: 9 (ref 5–15)
BILIRUBIN TOTAL: 0.4 mg/dL (ref 0.3–1.2)
BUN: 8 mg/dL (ref 6–20)
CALCIUM: 8.2 mg/dL — AB (ref 8.9–10.3)
CO2: 30 mmol/L (ref 22–32)
CREATININE: 0.9 mg/dL (ref 0.61–1.24)
Chloride: 99 mmol/L (ref 98–111)
GFR calc non Af Amer: >60 mL/min (ref >60)
GLUCOSE: 373 mg/dL — AB (ref 70–99)
Potassium: 3.9 mmol/L (ref 3.5–5.1)
Sodium: 138 mmol/L (ref 135–145)
Total Protein: 7.5 g/dL (ref 6.5–8.1)

## 2017-07-13 LAB — URINALYSIS, COMPLETE (UACMP) WITH MICROSCOPIC
Bacteria, UA: NONE SEEN
Bilirubin Urine: NEGATIVE
Ketones, ur: NEGATIVE mg/dL
Leukocytes, UA: NEGATIVE
Nitrite: NEGATIVE
Protein, ur: NEGATIVE mg/dL
SPECIFIC GRAVITY, URINE: 1.003 — AB (ref 1.005–1.030)
Squamous Epithelial / LPF: NONE SEEN (ref 0–5)
pH: 6 (ref 5.0–8.0)

## 2017-07-13 LAB — BLOOD GAS, VENOUS
ACID-BASE EXCESS: 5.9 mmol/L — AB (ref 0.0–2.0)
BICARBONATE: 32.1 mmol/L — AB (ref 20.0–28.0)
O2 Saturation: 98.4 %
PATIENT TEMPERATURE: 37
pCO2, Ven: 53 mmHg (ref 44.0–60.0)
pH, Ven: 7.39 (ref 7.250–7.430)
pO2, Ven: 114 mmHg — ABNORMAL HIGH (ref 32.0–45.0)

## 2017-07-13 LAB — CBC WITH DIFFERENTIAL/PLATELET
BASOS PCT: 1 %
Basophils Absolute: 0 10*3/uL (ref 0–0.1)
EOS ABS: 0 10*3/uL (ref 0–0.7)
EOS PCT: 1 %
HCT: 33.9 % — ABNORMAL LOW (ref 40.0–52.0)
Hemoglobin: 11.4 g/dL — ABNORMAL LOW (ref 13.0–18.0)
Lymphocytes Relative: 36 %
Lymphs Abs: 1.7 10*3/uL (ref 1.0–3.6)
MCH: 28.1 pg (ref 26.0–34.0)
MCHC: 33.5 g/dL (ref 32.0–36.0)
MCV: 83.7 fL (ref 80.0–100.0)
MONO ABS: 0.5 10*3/uL (ref 0.2–1.0)
MONOS PCT: 11 %
Neutro Abs: 2.4 10*3/uL (ref 1.4–6.5)
Neutrophils Relative %: 51 %
Platelets: 163 10*3/uL (ref 150–440)
RBC: 4.05 MIL/uL — ABNORMAL LOW (ref 4.40–5.90)
RDW: 13.7 % (ref 11.5–14.5)
WBC: 4.7 10*3/uL (ref 3.8–10.6)

## 2017-07-13 LAB — TROPONIN I

## 2017-07-13 LAB — GLUCOSE, CAPILLARY
GLUCOSE-CAPILLARY: 293 mg/dL — AB (ref 70–99)
Glucose-Capillary: 283 mg/dL — ABNORMAL HIGH (ref 70–99)

## 2017-07-13 MED ORDER — SODIUM CHLORIDE 0.9 % IV BOLUS
1000.0000 mL | Freq: Once | INTRAVENOUS | Status: AC
  Administered 2017-07-13: 1000 mL via INTRAVENOUS

## 2017-07-13 NOTE — ED Notes (Signed)
Pt urinated urine sent at this time.

## 2017-07-13 NOTE — ED Notes (Signed)
This RN notified [facility] of discharge and they are sending transportation for d/c.

## 2017-07-13 NOTE — ED Notes (Signed)
Pt mother called this RN requesting information, this RN states cant speak in another pt room to please call back, pt mother kept insisiting that I speak with her right I explained I was in another pt room that she could call back ina few moments. Pt mother asked my name RN provided it.

## 2017-07-13 NOTE — ED Provider Notes (Signed)
[facility] Emergency Department Provider Note  ____________________________________________   I have reviewed the triage vital signs and the nursing notes. Where available I have reviewed prior notes and, if possible and indicated, outside hospital notes.    HISTORY  Chief Complaint Hyperglycemia    HPI John [name]. is a 42 y.o. male with a history of schizophrenia, personality disorder, and sometimes poorly controlled diabetes who presents today with asymptomatic elevated blood glucose it was 406 at the facility.  He has been compliant with his medications he states.  He has absolutely no complaints.    Past Medical History:  Diagnosis Date  . Anemia   . Asthma   . Diabetes mellitus   . Morbid obesity ([place])   . Paranoid schizophrenia ([place])   . Personality disorder ([place])   . Sleep apnea   . Thyroid disease     Patient Active Problem List   Diagnosis Date Noted  . Encephalopathy acute   . Somnolence 11/01/2016  . AKI (acute kidney injury) ([place]) 11/01/2016  . Sepsis ([place]) 04/27/2016  . Respiratory failure ([place]) 12/20/2015  . Cardiomyopathy ([place]) 11/28/2015  . Tobacco abuse 11/28/2015  . Acute respiratory failure with hypercapnia ([place])   . OSA (obstructive sleep apnea)   . Essential hypertension 11/25/2015  . Hypothyroidism 11/25/2015  . Paranoid schizophrenia, chronic condition ([place]) 11/25/2015  . Acute respiratory failure ([place])   . Acute exacerbation of COPD with asthma ([place])   . Dyslipidemia associated with type 2 diabetes mellitus ([place])   . Diabetes mellitus due to underlying condition without complication, without long-term current use of insulin ([place])   . Morbid obesity ([place]) 09/29/2008    Past Surgical History:  Procedure Laterality Date  . FRACTURE SURGERY      Prior to Admission medications   Medication Sig Start Date End Date Taking? Authorizing Provider  asenapine (SAPHRIS) 5 MG SUBL 24 hr tablet Place 1 tablet (5 mg  total) 2 (two) times daily under the tongue. Patient taking differently: Place 10 mg under the tongue 2 (two) times daily.  11/13/16  Yes Bettey Costa, MD  atenolol (TENORMIN) 50 MG tablet Take 1 tablet (50 mg total) daily by mouth. 11/14/16  Yes Mody, Sital, MD  atorvastatin (LIPITOR) 10 MG tablet Take 10 mg by mouth daily.   Yes [provider]  cloZAPine (CLOZARIL) 100 MG tablet Take 100 mg by mouth daily.   Yes [provider]  divalproex (DEPAKOTE ER) 500 MG 24 hr tablet Take 2,000 mg by mouth at bedtime.    Yes [provider]  fluticasone furoate-vilanterol (BREO ELLIPTA) 100-25 MCG/INH AEPB Inhale 1 puff into the lungs daily.   Yes [provider]  furosemide (LASIX) 20 MG tablet Take 3 tablets (60 mg total) by mouth daily. 04/28/16  Yes Hillary Bow, MD  glipiZIDE (GLUCOTROL) 5 MG tablet Take 0.5 tablets (2.5 mg total) daily before breakfast by mouth. 11/13/16  Yes Mody, Sital, MD  haloperidol (HALDOL) 5 MG tablet Take 5 mg by mouth 2 (two) times daily.    Yes [provider]  latanoprost (XALATAN) 0.005 % ophthalmic solution Place 1 drop into both eyes at bedtime.   Yes [provider]  levothyroxine (SYNTHROID, LEVOTHROID) 75 MCG tablet Take 75 mcg by mouth daily.   Yes [provider]  loratadine (CLARITIN) 10 MG tablet Take 10 mg by mouth daily.   Yes [provider]  losartan (COZAAR) 50 MG tablet Take 50 mg by mouth daily.  Yes [provider]  metFORMIN (GLUCOPHAGE) 500 MG tablet Take 1 tablet (500 mg total) by mouth 2 (two) times daily with a meal. 07/07/17 08/06/17 Yes Arta Silence, MD  omeprazole (PRILOSEC) 20 MG capsule Take 20 mg by mouth daily.   Yes [provider]  oxybutynin (DITROPAN) 5 MG tablet Take 5 mg by mouth daily.   Yes [provider]  triamcinolone cream (KENALOG) 0.1 % Apply 1 application topically 2 (two) times daily as needed.    Yes [provider]   ibuprofen (ADVIL,MOTRIN) 600 MG tablet Take 1 tablet (600 mg total) by mouth every 6 (six) hours as needed (foot pain). 01/04/17   Schaevitz, Randall An, MD    Allergies Patient has no known allergies.  Family History  Problem Relation Age of Onset  . Glaucoma Mother     Social History Social History   Tobacco Use  . Smoking status: Former Research scientist (life sciences)  . Smokeless tobacco: Never Used  Substance Use Topics  . Alcohol use: Yes    Comment: only once in a while  . Drug use: No    Review of Systems Constitutional: No fever/chills Eyes: No visual changes. ENT: No sore throat. No stiff neck no neck pain Cardiovascular: Denies chest pain. Respiratory: Denies shortness of breath. Gastrointestinal:   no vomiting.  No diarrhea.  No constipation. Genitourinary: Negative for dysuria. Musculoskeletal: Negative lower extremity swelling Skin: Negative for rash. Neurological: Negative for severe headaches, focal weakness or numbness.   ____________________________________________   PHYSICAL EXAM:  VITAL SIGNS: ED Triage Vitals  Enc Vitals Group     BP 07/13/17 1039 (!) 110/56     Pulse --      Resp --      Temp 07/13/17 1039 98 F (36.7 C)     Temp Source 07/13/17 1039 Oral     SpO2 07/13/17 1039 98 %     Weight 07/13/17 1040 (!) 323 lb (146.5 kg)     Height 07/13/17 1040 6' (1.829 m)     Head Circumference --      Peak Flow --      Pain Score 07/13/17 1040 0     Pain Loc --      Pain Edu? --      Excl. in [place]? --     Constitutional: Alert and oriented. Well appearing and in no acute distress. Eyes: Conjunctivae are normal Head: Atraumatic HEENT: No congestion/rhinnorhea. Mucous membranes are moist.  Oropharynx non-erythematous Neck:   Nontender with no meningismus, no masses, no stridor Cardiovascular: Normal rate, regular rhythm. Grossly normal heart sounds.  Good peripheral circulation. Respiratory: Normal respiratory effort.  No retractions. Lungs CTAB. Abdominal:  Soft and nontender. No distention. No guarding no rebound Back:  There is no focal tenderness or step off.  there is no midline tenderness there are no lesions noted. there is no CVA tenderness Musculoskeletal: No lower extremity tenderness, no upper extremity tenderness. No joint effusions, no DVT signs strong distal pulses no edema Neurologic:  Normal speech and language. No gross focal neurologic deficits are appreciated.  Skin:  Skin is warm, dry and intact. No rash noted. Psychiatric: Mood and affect are normal. Speech and behavior are normal.  ____________________________________________   LABS (all labs ordered are listed, but only abnormal results are displayed)  Labs Reviewed  BLOOD GAS, VENOUS - Abnormal; Notable for the following components:      Result Value   pO2, Ven 114.0 (*)    Bicarbonate 32.1 (*)  Acid-Base Excess 5.9 (*)    All other components within normal limits  COMPREHENSIVE METABOLIC PANEL - Abnormal; Notable for the following components:   Glucose, Bld 373 (*)    Calcium 8.2 (*)    All other components within normal limits  CBC WITH DIFFERENTIAL/PLATELET - Abnormal; Notable for the following components:   RBC 4.05 (*)    Hemoglobin 11.4 (*)    HCT 33.9 (*)    All other components within normal limits  URINALYSIS, COMPLETE (UACMP) WITH MICROSCOPIC - Abnormal; Notable for the following components:   Color, Urine COLORLESS (*)    APPearance CLEAR (*)    Specific Gravity, Urine 1.003 (*)    Glucose, UA >=500 (*)    Hgb urine dipstick SMALL (*)    All other components within normal limits  GLUCOSE, CAPILLARY - Abnormal; Notable for the following components:   Glucose-Capillary 283 (*)    All other components within normal limits  TROPONIN I  CBG MONITORING, ED    Pertinent labs  results that were available during my care of the patient were reviewed by me and considered in my medical decision making (see chart for  details). ____________________________________________  EKG  I personally interpreted any EKGs ordered by me or triage Sinus rhythm rate 92 bpm no acute ST elevation or depression normal axis ____________________________________________  RADIOLOGY  Pertinent labs & imaging results that were available during my care of the patient were reviewed by me and considered in my medical decision making (see chart for details). If possible, patient and/or family made aware of any abnormal findings.  Dg Chest 2 View  Result Date: 07/13/2017 CLINICAL DATA:  High blood sugar.  Shortness of breath. EXAM: CHEST - 2 VIEW COMPARISON:  July 07, 2017 FINDINGS: The heart size and mediastinal contours are within normal limits. Both lungs are clear. The visualized skeletal structures are unremarkable. IMPRESSION: No active cardiopulmonary disease. Electronically Signed   By: Dorise Bullion III M.D   On: 07/13/2017 11:24   ____________________________________________    PROCEDURES  Procedure(s) performed: None  Procedures  Critical Care performed: None  ____________________________________________   INITIAL IMPRESSION / ASSESSMENT AND PLAN / ED COURSE  Pertinent labs & imaging results that were available during my care of the patient were reviewed by me and considered in my medical decision making (see chart for details).  Patient with elevated blood glucose, sometimes is over 400 here.  Unclear exactly why he is on oral glycemic agents.  Likely secondary to dietary indiscretion.  In any event, he is 323 pounds.  Patient has no complaints he is in no acute distress, he is I think somewhat of a limited historian and for this reason I did do a pretty broad work-up on him to make sure that there is nothing else causing his sugar to go up such as infection or ischemia etc.  There is no evidence of DKA, his blood sugars down to 150 points just with fluids and not eating sugar while here, electrolytes are  reassuring kidney function is reassuring, liver function tests are reassuring cardiac enzymes are reassuring CBC is reassuring urinalysis reassuring, at this time therefore I think we are dealing with absolutely asymptomatic hyperglycemia which is rapidly trending down on its own.  I do not see any indication for acute intervention at this time, we will discharge him with close outpatient follow-up and return precautions.  We did give him IV fluids   ____________________________________________   FINAL CLINICAL IMPRESSION(S) / ED DIAGNOSES  Final diagnoses:  None      This chart was dictated using voice recognition software.  Despite best efforts to proofread,  errors can occur which can change meaning.      Schuyler Amor, MD 07/13/17 1325

## 2017-07-13 NOTE — ED Notes (Signed)
Labs sent for results Resp aware of VBG

## 2017-07-13 NOTE — ED Notes (Signed)
Pt has legal gaurdian at bedside. Debroah Baller  Is transporting pt back to facility.

## 2017-07-13 NOTE — ED Triage Notes (Signed)
Pt from [facility] for hyperglycemia, CBG 376 by EMS. 97.3 Temp pt Is A/Ox4

## 2017-07-13 NOTE — ED Notes (Signed)
2nd Urine sent at this time. Notified Lab, urine pending d/c.

## 2017-07-13 NOTE — ED Notes (Addendum)
This RN notified [facility], of pt arrival for hyperglycemia facility agreed that transport will be arranged on D/C.

## 2017-07-28 ENCOUNTER — Emergency Department: Payer: 59

## 2017-07-28 ENCOUNTER — Other Ambulatory Visit: Payer: Self-pay

## 2017-07-28 ENCOUNTER — Emergency Department
Admission: EM | Admit: 2017-07-28 | Discharge: 2017-07-29 | Disposition: A | Discharge status: Home / Self Care | Payer: 59 | Attending: Emergency Medicine | Admitting: Emergency Medicine

## 2017-07-28 DIAGNOSIS — R109 Unspecified abdominal pain: Secondary | ICD-10-CM | POA: Diagnosis not present

## 2017-07-28 DIAGNOSIS — I10 Essential (primary) hypertension: Secondary | ICD-10-CM | POA: Diagnosis not present

## 2017-07-28 DIAGNOSIS — E039 Hypothyroidism, unspecified: Secondary | ICD-10-CM | POA: Diagnosis not present

## 2017-07-28 DIAGNOSIS — E1165 Type 2 diabetes mellitus with hyperglycemia: Secondary | ICD-10-CM | POA: Insufficient documentation

## 2017-07-28 DIAGNOSIS — F209 Schizophrenia, unspecified: Secondary | ICD-10-CM | POA: Diagnosis not present

## 2017-07-28 DIAGNOSIS — Z7984 Long term (current) use of oral hypoglycemic drugs: Secondary | ICD-10-CM | POA: Diagnosis not present

## 2017-07-28 DIAGNOSIS — Z87891 Personal history of nicotine dependence: Secondary | ICD-10-CM | POA: Diagnosis not present

## 2017-07-28 DIAGNOSIS — Z79899 Other long term (current) drug therapy: Secondary | ICD-10-CM | POA: Diagnosis not present

## 2017-07-28 DIAGNOSIS — K5900 Constipation, unspecified: Secondary | ICD-10-CM | POA: Insufficient documentation

## 2017-07-28 DIAGNOSIS — J45909 Unspecified asthma, uncomplicated: Secondary | ICD-10-CM | POA: Insufficient documentation

## 2017-07-28 DIAGNOSIS — R739 Hyperglycemia, unspecified: Secondary | ICD-10-CM

## 2017-07-28 LAB — CBC
HEMATOCRIT: 36.1 % — AB (ref 40.0–52.0)
Hemoglobin: 12.4 g/dL — ABNORMAL LOW (ref 13.0–18.0)
MCH: 28.6 pg (ref 26.0–34.0)
MCHC: 34.3 g/dL (ref 32.0–36.0)
MCV: 83.2 fL (ref 80.0–100.0)
Platelets: 169 10*3/uL (ref 150–440)
RBC: 4.34 MIL/uL — ABNORMAL LOW (ref 4.40–5.90)
RDW: 13.9 % (ref 11.5–14.5)
WBC: 5.2 10*3/uL (ref 3.8–10.6)

## 2017-07-28 LAB — COMPREHENSIVE METABOLIC PANEL
ALT: 18 U/L (ref 0–44)
AST: 20 U/L (ref 15–41)
Albumin: 3.8 g/dL (ref 3.5–5.0)
Alkaline Phosphatase: 56 U/L (ref 38–126)
Anion gap: 11 (ref 5–15)
BILIRUBIN TOTAL: 0.5 mg/dL (ref 0.3–1.2)
BUN: 9 mg/dL (ref 6–20)
CO2: 28 mmol/L (ref 22–32)
CREATININE: 0.81 mg/dL (ref 0.61–1.24)
Calcium: 8.9 mg/dL (ref 8.9–10.3)
Chloride: 94 mmol/L — ABNORMAL LOW (ref 98–111)
GFR calc Af Amer: >60 mL/min (ref >60)
Glucose, Bld: 383 mg/dL — ABNORMAL HIGH (ref 70–99)
POTASSIUM: 4 mmol/L (ref 3.5–5.1)
Sodium: 133 mmol/L — ABNORMAL LOW (ref 135–145)
TOTAL PROTEIN: 8 g/dL (ref 6.5–8.1)

## 2017-07-28 MED ORDER — POLYETHYLENE GLYCOL 3350 17 G PO PACK: 17.0000 g | PACK | Freq: Every day | ORAL | 0 refills | Status: DC

## 2017-07-28 NOTE — ED Notes (Signed)
Patient resting quietly with eyes closed in no acute distress.  

## 2017-07-28 NOTE — ED Provider Notes (Signed)
[facility] Emergency Department Provider Note  ____________________________________________   First MD Initiated Contact with Patient 07/28/17 2021     (approximate)  I have reviewed the triage vital signs and the nursing notes.   HISTORY  Chief Complaint Flank Pain  Level 5 caveat:  history/ROS MAY be limited by chronic mental illness.   HPI John [name]. is a 42 y.o. male with medical issues as listed below including chronic psychiatric illness.  He presents for evaluation of pain in his right side.  He has hard time describing it but says that it comes and goes and feels dull.  Nothing particular makes it better or worse.  He feels constipated and cannot remember when he last had a bowel movement.  He is not having any nausea or vomiting.  He has no history of kidney stones.  He denies fever/chills, chest pain or shortness of breath, nausea, vomiting, and diarrhea.  He has no dysuria.  He knows that his blood sugar runs high due to diabetes but he says he has been taking his medications.  Past Medical History:  Diagnosis Date  . Anemia   . Asthma   . Diabetes mellitus   . Morbid obesity ([place])   . Paranoid schizophrenia ([place])   . Personality disorder ([place])   . Sleep apnea   . Thyroid disease     Patient Active Problem List   Diagnosis Date Noted  . Encephalopathy acute   . Somnolence 11/01/2016  . AKI (acute kidney injury) ([place]) 11/01/2016  . Sepsis ([place]) 04/27/2016  . Respiratory failure ([place]) 12/20/2015  . Cardiomyopathy ([place]) 11/28/2015  . Tobacco abuse 11/28/2015  . Acute respiratory failure with hypercapnia ([place])   . OSA (obstructive sleep apnea)   . Essential hypertension 11/25/2015  . Hypothyroidism 11/25/2015  . Paranoid schizophrenia, chronic condition ([place]) 11/25/2015  . Acute respiratory failure ([place])   . Acute exacerbation of COPD with asthma ([place])   . Dyslipidemia associated with type 2 diabetes mellitus ([place])   .  Diabetes mellitus due to underlying condition without complication, without long-term current use of insulin ([place])   . Morbid obesity ([place]) 09/29/2008    Past Surgical History:  Procedure Laterality Date  . FRACTURE SURGERY      Prior to Admission medications   Medication Sig Start Date End Date Taking? Authorizing Provider  asenapine (SAPHRIS) 5 MG SUBL 24 hr tablet Place 1 tablet (5 mg total) 2 (two) times daily under the tongue. Patient taking differently: Place 10 mg under the tongue 2 (two) times daily.  11/13/16   Bettey Costa, MD  atenolol (TENORMIN) 50 MG tablet Take 1 tablet (50 mg total) daily by mouth. 11/14/16   Bettey Costa, MD  atorvastatin (LIPITOR) 10 MG tablet Take 10 mg by mouth daily.    [provider]  cloZAPine (CLOZARIL) 100 MG tablet Take 100 mg by mouth daily.    [provider]  divalproex (DEPAKOTE ER) 500 MG 24 hr tablet Take 2,000 mg by mouth at bedtime.     [provider]  fluticasone furoate-vilanterol (BREO ELLIPTA) 100-25 MCG/INH AEPB Inhale 1 puff into the lungs daily.    [provider]  furosemide (LASIX) 20 MG tablet Take 3 tablets (60 mg total) by mouth daily. 04/28/16   Hillary Bow, MD  glipiZIDE (GLUCOTROL) 5 MG tablet Take 0.5 tablets (2.5 mg total) daily before breakfast by mouth. 11/13/16   Bettey Costa, MD  haloperidol (HALDOL) 5 MG tablet Take  5 mg by mouth 2 (two) times daily.     [provider]  ibuprofen (ADVIL,MOTRIN) 600 MG tablet Take 1 tablet (600 mg total) by mouth every 6 (six) hours as needed (foot pain). 01/04/17   Schaevitz, Randall An, MD  latanoprost (XALATAN) 0.005 % ophthalmic solution Place 1 drop into both eyes at bedtime.    [provider]  levothyroxine (SYNTHROID, LEVOTHROID) 75 MCG tablet Take 75 mcg by mouth daily.    [provider]  loratadine (CLARITIN) 10 MG tablet Take 10 mg by mouth daily.    [provider]  losartan (COZAAR) 50 MG tablet Take  50 mg by mouth daily.    [provider]  metFORMIN (GLUCOPHAGE) 500 MG tablet Take 1 tablet (500 mg total) by mouth 2 (two) times daily with a meal. 07/07/17 08/06/17  Arta Silence, MD  omeprazole (PRILOSEC) 20 MG capsule Take 20 mg by mouth daily.    [provider]  oxybutynin (DITROPAN) 5 MG tablet Take 5 mg by mouth daily.    [provider]  polyethylene glycol (MIRALAX / GLYCOLAX) packet Take 17 g by mouth daily. Mix with water or juice as listed on the label. 07/28/17   Hinda Kehr, MD  triamcinolone cream (KENALOG) 0.1 % Apply 1 application topically 2 (two) times daily as needed.     [provider]    Allergies Patient has no known allergies.  Family History  Problem Relation Age of Onset  . Glaucoma Mother     Social History Social History   Tobacco Use  . Smoking status: Former Research scientist (life sciences)  . Smokeless tobacco: Never Used  Substance Use Topics  . Alcohol use: Yes    Comment: only once in a while  . Drug use: No    Review of Systems Constitutional: No fever/chills Eyes: No visual changes. ENT: No sore throat. Cardiovascular: Denies chest pain. Respiratory: Denies shortness of breath. Gastrointestinal: Right-sided abdominal pain and constipation as described above Genitourinary: Negative for dysuria. Musculoskeletal: Negative for neck pain.  Negative for back pain. Integumentary: Negative for rash. Neurological: Negative for headaches, focal weakness or numbness.   ____________________________________________   PHYSICAL EXAM:  VITAL SIGNS: ED Triage Vitals  Enc Vitals Group     BP 07/28/17 2000 116/86     Pulse Rate 07/28/17 2000 94     Resp 07/28/17 2000 18     Temp 07/28/17 2000 97.7 F (36.5 C)     Temp Source 07/28/17 2000 Oral     SpO2 07/28/17 2000 94 %     Weight 07/28/17 2001 (!) 147.9 kg (326 lb)     Height 07/28/17 2001 1.829 m (6')     Head Circumference --      Peak Flow --      Pain Score 07/28/17  2001 8     Pain Loc --      Pain Edu? --      Excl. in [place]? --     Constitutional: Alert and oriented. Well appearing and in no acute distress. Eyes: Conjunctivae are normal.  Head: Atraumatic. Nose: No congestion/rhinnorhea. Mouth/Throat: Mucous membranes are moist. Neck: No stridor.  No meningeal signs.   Cardiovascular: Normal rate, regular rhythm. Good peripheral circulation. Grossly normal heart sounds. Respiratory: Normal respiratory effort.  No retractions. Lungs CTAB. Gastrointestinal: Soft and nontender. No distention.  Musculoskeletal: No lower extremity tenderness nor edema. No gross deformities of extremities.  Mild CVA tenderness to percussion bilaterally. Neurologic:  Normal speech and  language. No gross focal neurologic deficits are appreciated.  Skin:  Skin is warm, dry and intact. No rash noted.   ____________________________________________   LABS (all labs ordered are listed, but only abnormal results are displayed)  Labs Reviewed  CBC - Abnormal; Notable for the following components:      Result Value   RBC 4.34 (*)    Hemoglobin 12.4 (*)    HCT 36.1 (*)    All other components within normal limits  COMPREHENSIVE METABOLIC PANEL - Abnormal; Notable for the following components:   Sodium 133 (*)    Chloride 94 (*)    Glucose, Bld 383 (*)    All other components within normal limits  URINALYSIS, COMPLETE (UACMP) WITH MICROSCOPIC   ____________________________________________  EKG  None - EKG not ordered by ED physician ____________________________________________  RADIOLOGY .I, Hinda Kehr, personally viewed and evaluated these images (plain radiographs) as part of my medical decision making, as well as reviewing the written report by the radiologist.   ED MD interpretation: Constipation on CT renal stone study, otherwise unremarkable.  No acute intrathoracic abnormalities on chest x-ray  Official radiology report(s): Dg Chest 2 View  Result  Date: 07/28/2017 CLINICAL DATA:  Right chest pain EXAM: CHEST - 2 VIEW COMPARISON:  07/13/2017 FINDINGS: Heart and mediastinal contours are within normal limits. No focal opacities or effusions. No acute bony abnormality. No visible rib fracture. No pneumothorax. IMPRESSION: No active cardiopulmonary disease. Electronically Signed   By: Rolm Baptise M.D.   On: 07/28/2017 20:36   Ct Renal Stone Study  Result Date: 07/28/2017 CLINICAL DATA:  RIGHT-sided pain for 4 days. History of hypertension, diabetes. EXAM: CT ABDOMEN AND PELVIS WITHOUT CONTRAST TECHNIQUE: Multidetector CT imaging of the abdomen and pelvis was performed following the standard protocol without IV contrast. COMPARISON:  Acute abdominal series November 13, 2016 FINDINGS: LOWER CHEST: Small RIGHT-greater-than-LEFT pleural effusions. Mild compressive atelectasis. Included heart size is normal. No pericardial effusion. HEPATOBILIARY: Hepatic steatosis with focal fatty sparing about the gallbladder fossa. Normal gallbladder. PANCREAS: Mild fatty replacement, nonacute. SPLEEN: Normal. ADRENALS/URINARY TRACT: Kidneys are orthotopic, demonstrating normal size and morphology. No nephrolithiasis, hydronephrosis; limited assessment for renal masses by nonenhanced CT. The unopacified ureters are normal in course and caliber. Urinary bladder is adequately distended and unremarkable. Normal adrenal glands. STOMACH/BOWEL: The stomach, small and large bowel are normal in course and caliber without inflammatory changes, sensitivity decreased by lack of enteric contrast. Moderate amount of retained large bowel stool. Normal appendix. VASCULAR/LYMPHATIC: Aortoiliac vessels are normal in course and caliber, trace calcific atherosclerosis. No lymphadenopathy by CT size criteria. REPRODUCTIVE: Normal. Foot OTHER: No intraperitoneal free fluid or free air. MUSCULOSKELETAL: Non-acute. IMPRESSION: 1. No nephrolithiasis, hydronephrosis or acute intra-abdominal/pelvic  process. 2. Moderate amount of retained large bowel stool. 3. Hepatic steatosis. Aortic Atherosclerosis (ICD10-I70.0). Electronically Signed   By: Elon Alas M.D.   On: 07/28/2017 23:17    ____________________________________________   PROCEDURES  Critical Care performed: No   Procedure(s) performed:   Procedures   ____________________________________________   INITIAL IMPRESSION / ASSESSMENT AND PLAN / ED COURSE  As part of my medical decision making, I reviewed the following data within the [facility] notes reviewed and incorporated, Labs reviewed , Old chart reviewed and Notes from prior ED visits    Differential diagnosis includes, but is not limited to, musculoskeletal pain, rib contusion or fracture, renal colic, constipation, acute intra-abdominal infection, pneumonia.  The patient is hyperglycemic but this is unlikely to cause  his symptoms and does not need acute intervention at this time.  His labs are essentially within normal limits except for the hyperglycemia with a glucose of 383.  His vital signs are stable and is afebrile.  His abdominal exam is reassuring with no tenderness to palpation but some CVA tenderness.  I obtained a CT scan of his abdomen and pelvis (renal stone protocol) and it was notable for constipation but otherwise unremarkable.  I think the constipation makes sense in this context given no evidence of any other acute or emergent medical condition.  I wrote him a prescription for MiraLAX and provided my usual and customary management recommendations and return precautions.  He says that he understands and agrees with the plan.  My charge nurse verify that the patient is his own legal guardian and makes his own decisions.  He has been resting comfortably throughout hours of observation in the emergency department.     ____________________________________________  FINAL CLINICAL IMPRESSION(S) / ED DIAGNOSES  Final  diagnoses:  Right sided abdominal pain  Hyperglycemia  Constipation, unspecified constipation type     MEDICATIONS GIVEN DURING THIS VISIT:  Medications - No data to display   ED Discharge Orders        Ordered    polyethylene glycol (MIRALAX / GLYCOLAX) packet  Daily,   Status:  Discontinued     07/28/17 2355    polyethylene glycol (MIRALAX / GLYCOLAX) packet  Daily     07/28/17 2356       Note:  This document was prepared using Dragon voice recognition software and may include unintentional dictation errors.    Hinda Kehr, MD 07/29/17 [phone]

## 2017-07-28 NOTE — ED Triage Notes (Signed)
Pt arrives via ems from the group home, pt states that he has been having rt side pain for the past 3-4 days. Pt is tender to touch, denies nausea, vomiting, diarrhea, or pain with urination, pt denies cough, states that his blood sugar fluctuates, sometimes it is high and sometimes it is low, ems reports fsbs of 408 today

## 2017-07-28 NOTE — ED Notes (Signed)
Patient transported to X-ray 

## 2017-07-28 NOTE — ED Notes (Signed)
Patient resting quietly in with eyes closed in no acute distress.

## 2017-07-28 NOTE — Discharge Instructions (Signed)
Your workup in the Emergency Department today was reassuring.  We did not find any specific abnormalities except for high blood sugar and constipation.  We recommend you drink plenty of fluids, take your regular medications and/or any new ones prescribed today, and follow up with the doctor(s) listed in these documents as recommended.  Try taking the prescribed over-the-counter medication for constipation.  Be sure to take it according to label instructions.  Consider the other following medications:  1)  Colace (or Dulcolax) 100 mg:  This is a stool softener, and you may take it once or twice a day as needed. 2)  Senna tablets:  This is a bowel stimulant that will help "push" out your stool. It is the next step to add after you have tried a stool softener. 3)  Look for magnesium citrate at the pharmacy (it is usually a small glass bottle).  Drink the bottle according to the label instructions.  Return to the Emergency Department if you develop new or worsening symptoms that concern you.

## 2017-07-28 NOTE — ED Notes (Signed)
Patient repositioned at this time

## 2017-07-28 NOTE — ED Notes (Signed)
Attempt made by this RN to contact the legal guardian listed on record, [place] mail box is full unable to leave a message

## 2017-07-28 NOTE — ED Notes (Signed)
Patient given water at this time.  

## 2017-07-29 DIAGNOSIS — Z7189 Other specified counseling: Secondary | ICD-10-CM | POA: Diagnosis not present

## 2017-07-29 DIAGNOSIS — Z9189 Other specified personal risk factors, not elsewhere classified: Secondary | ICD-10-CM | POA: Diagnosis not present

## 2017-07-29 DIAGNOSIS — E1165 Type 2 diabetes mellitus with hyperglycemia: Secondary | ICD-10-CM | POA: Diagnosis not present

## 2017-07-29 DIAGNOSIS — Z09 Encounter for follow-up examination after completed treatment for conditions other than malignant neoplasm: Secondary | ICD-10-CM | POA: Diagnosis not present

## 2017-07-30 ENCOUNTER — Emergency Department
Admission: EM | Admit: 2017-07-30 | Discharge: 2017-07-30 | Disposition: A | Discharge status: Home / Self Care | Payer: 59 | Attending: Emergency Medicine | Admitting: Emergency Medicine

## 2017-07-30 ENCOUNTER — Encounter: Payer: Self-pay | Admitting: Emergency Medicine

## 2017-07-30 ENCOUNTER — Other Ambulatory Visit: Payer: Self-pay

## 2017-07-30 DIAGNOSIS — J45909 Unspecified asthma, uncomplicated: Secondary | ICD-10-CM | POA: Diagnosis not present

## 2017-07-30 DIAGNOSIS — E1165 Type 2 diabetes mellitus with hyperglycemia: Secondary | ICD-10-CM | POA: Insufficient documentation

## 2017-07-30 DIAGNOSIS — Z87891 Personal history of nicotine dependence: Secondary | ICD-10-CM | POA: Diagnosis not present

## 2017-07-30 DIAGNOSIS — E039 Hypothyroidism, unspecified: Secondary | ICD-10-CM | POA: Diagnosis not present

## 2017-07-30 DIAGNOSIS — I10 Essential (primary) hypertension: Secondary | ICD-10-CM | POA: Insufficient documentation

## 2017-07-30 DIAGNOSIS — K5900 Constipation, unspecified: Secondary | ICD-10-CM | POA: Insufficient documentation

## 2017-07-30 DIAGNOSIS — R739 Hyperglycemia, unspecified: Secondary | ICD-10-CM

## 2017-07-30 LAB — GLUCOSE, CAPILLARY
GLUCOSE-CAPILLARY: 363 mg/dL — AB (ref 70–99)
GLUCOSE-CAPILLARY: 390 mg/dL — AB (ref 70–99)
Glucose-Capillary: 422 mg/dL — ABNORMAL HIGH (ref 70–99)

## 2017-07-30 LAB — CBC WITH DIFFERENTIAL/PLATELET
BASOS ABS: 0 10*3/uL (ref 0–0.1)
BASOS PCT: 1 %
EOS ABS: 0 10*3/uL (ref 0–0.7)
EOS PCT: 1 %
HCT: 34.7 % — ABNORMAL LOW (ref 40.0–52.0)
Hemoglobin: 11.6 g/dL — ABNORMAL LOW (ref 13.0–18.0)
Lymphocytes Relative: 33 %
Lymphs Abs: 1.9 10*3/uL (ref 1.0–3.6)
MCH: 28.1 pg (ref 26.0–34.0)
MCHC: 33.3 g/dL (ref 32.0–36.0)
MCV: 84.3 fL (ref 80.0–100.0)
Monocytes Absolute: 0.8 10*3/uL (ref 0.2–1.0)
Monocytes Relative: 14 %
NEUTROS PCT: 51 %
Neutro Abs: 2.9 10*3/uL (ref 1.4–6.5)
PLATELETS: 182 10*3/uL (ref 150–440)
RBC: 4.12 MIL/uL — AB (ref 4.40–5.90)
RDW: 13.9 % (ref 11.5–14.5)
WBC: 5.7 10*3/uL (ref 3.8–10.6)

## 2017-07-30 LAB — COMPREHENSIVE METABOLIC PANEL
ALBUMIN: 3.6 g/dL (ref 3.5–5.0)
ALK PHOS: 61 U/L (ref 38–126)
ALT: 17 U/L (ref 0–44)
ANION GAP: 9 (ref 5–15)
AST: 16 U/L (ref 15–41)
BUN: 15 mg/dL (ref 6–20)
CALCIUM: 8.9 mg/dL (ref 8.9–10.3)
CHLORIDE: 93 mmol/L — AB (ref 98–111)
CO2: 32 mmol/L (ref 22–32)
Creatinine, Ser: 1.02 mg/dL (ref 0.61–1.24)
GFR calc non Af Amer: >60 mL/min (ref >60)
GLUCOSE: 430 mg/dL — AB (ref 70–99)
Potassium: 4.2 mmol/L (ref 3.5–5.1)
SODIUM: 134 mmol/L — AB (ref 135–145)
Total Bilirubin: 0.5 mg/dL (ref 0.3–1.2)
Total Protein: 8.2 g/dL — ABNORMAL HIGH (ref 6.5–8.1)

## 2017-07-30 MED ORDER — SODIUM CHLORIDE 0.9 % IV SOLN
Freq: Once | INTRAVENOUS | Status: AC
  Administered 2017-07-30: 20:00:00 via INTRAVENOUS

## 2017-07-30 MED ORDER — INSULIN ASPART 100 UNIT/ML ~~LOC~~ SOLN
5.0000 [IU] | Freq: Once | SUBCUTANEOUS | Status: AC
  Administered 2017-07-30: 5 [IU] via SUBCUTANEOUS
  Filled 2017-07-30: qty 1

## 2017-07-30 MED ORDER — INSULIN ASPART 100 UNIT/ML ~~LOC~~ SOLN
10.0000 [IU] | Freq: Once | SUBCUTANEOUS | Status: AC
  Administered 2017-07-30: 10 [IU] via SUBCUTANEOUS
  Filled 2017-07-30: qty 1

## 2017-07-30 NOTE — ED Notes (Addendum)
John [name] [phone] [facility] - will come pick up pt when up for dc

## 2017-07-30 NOTE — ED Notes (Signed)
Pt's care discussed with Dr. Archie Balboa, per Dr. Archie Balboa no labs drawn at this time.

## 2017-07-30 NOTE — ED Provider Notes (Signed)
[facility] Emergency Department Provider Note       Time seen: ----------------------------------------- 7:24 PM on 07/30/2017 -----------------------------------------   I have reviewed the triage vital signs and the nursing notes.  HISTORY   Chief Complaint Abdominal Pain and Hyperglycemia    HPI John Vayda. is a 42 y.o. male with a history of anemia, asthma, diabetes, schizophrenia who presents to the ED for nausea, vomiting and diarrhea.  Patient presents with right-sided abdominal pain that started Friday and has been increasingly worse since then.  Patient also has reported hyperglycemia.  He denies any other complaints, denies fevers, chills or other complaints.  Patient states he has taken his medications as prescribed but is is not sticking to his diabetic diet very well.  Past Medical History:  Diagnosis Date  . Anemia   . Asthma   . Diabetes mellitus   . Morbid obesity ([place])   . Paranoid schizophrenia ([place])   . Personality disorder ([place])   . Sleep apnea   . Thyroid disease     Patient Active Problem List   Diagnosis Date Noted  . Encephalopathy acute   . Somnolence 11/01/2016  . AKI (acute kidney injury) ([place]) 11/01/2016  . Sepsis ([place]) 04/27/2016  . Respiratory failure ([place]) 12/20/2015  . Cardiomyopathy ([place]) 11/28/2015  . Tobacco abuse 11/28/2015  . Acute respiratory failure with hypercapnia ([place])   . OSA (obstructive sleep apnea)   . Essential hypertension 11/25/2015  . Hypothyroidism 11/25/2015  . Paranoid schizophrenia, chronic condition ([place]) 11/25/2015  . Acute respiratory failure ([place])   . Acute exacerbation of COPD with asthma ([place])   . Dyslipidemia associated with type 2 diabetes mellitus ([place])   . Diabetes mellitus due to underlying condition without complication, without long-term current use of insulin ([place])   . Morbid obesity ([place]) 09/29/2008    Past Surgical History:  Procedure Laterality Date  . FRACTURE  SURGERY      Allergies Patient has no known allergies.  Social History Social History   Tobacco Use  . Smoking status: Former Research scientist (life sciences)  . Smokeless tobacco: Never Used  Substance Use Topics  . Alcohol use: Yes    Comment: only once in a while  . Drug use: No   Review of Systems Constitutional: Negative for fever. Cardiovascular: Negative for chest pain. Respiratory: Negative for shortness of breath. Gastrointestinal: Positive for abdominal pain, constipation Genitourinary: Negative for dysuria. Musculoskeletal: Negative for back pain. Skin: Negative for rash. Neurological: Negative for headaches, focal weakness or numbness.  All systems negative/normal/unremarkable except as stated in the HPI  ____________________________________________   PHYSICAL EXAM:  VITAL SIGNS: ED Triage Vitals [07/30/17 1730]  Enc Vitals Group     BP 123/68     Pulse Rate 96     Resp 18     Temp 97.9 F (36.6 C)     Temp Source Oral     SpO2 95 %     Weight (!) 326 lb (147.9 kg)     Height 6' (1.829 m)     Head Circumference      Peak Flow      Pain Score 0     Pain Loc      Pain Edu?      Excl. in [place]?    Constitutional: Alert, Well appearing and in no distress. Eyes: Conjunctivae are normal. Normal extraocular movements. Cardiovascular: Normal rate, regular rhythm. No murmurs, rubs, or gallops. Respiratory: Normal respiratory effort without tachypnea nor retractions. Breath sounds  are clear and equal bilaterally. No wheezes/rales/rhonchi. Gastrointestinal: Soft and nontender. Normal bowel sounds Musculoskeletal: Nontender with normal range of motion in extremities. No lower extremity tenderness nor edema. Neurologic:  Normal speech and language. No gross focal neurologic deficits are appreciated.  Skin:  Skin is warm, dry and intact. No rash noted. Psychiatric: Mood and affect are normal. Speech and behavior are normal.  ____________________________________________  ED  COURSE:  As part of my medical decision making, I reviewed the following data within the [place] History obtained from family if available, nursing notes, old chart and ekg, as well as notes from prior ED visits. Patient presented for hyperglycemia and constipation, we will assess with labs and imaging as indicated at this time.   Procedures ____________________________________________   LABS (pertinent positives/negatives)  Labs Reviewed  GLUCOSE, CAPILLARY - Abnormal; Notable for the following components:      Result Value   Glucose-Capillary 363 (*)    All other components within normal limits  CBC WITH DIFFERENTIAL/PLATELET - Abnormal; Notable for the following components:   RBC 4.12 (*)    Hemoglobin 11.6 (*)    HCT 34.7 (*)    All other components within normal limits  COMPREHENSIVE METABOLIC PANEL - Abnormal; Notable for the following components:   Sodium 134 (*)    Chloride 93 (*)    Glucose, Bld 430 (*)    Total Protein 8.2 (*)    All other components within normal limits  GLUCOSE, CAPILLARY - Abnormal; Notable for the following components:   Glucose-Capillary 422 (*)    All other components within normal limits  GLUCOSE, CAPILLARY - Abnormal; Notable for the following components:   Glucose-Capillary 390 (*)    All other components within normal limits  CBG MONITORING, ED   ____________________________________________  DIFFERENTIAL DIAGNOSIS   Dehydration, electrolyte abnormality, DKA unlikely, HH NK unlikely, constipation, medication noncompliance  FINAL ASSESSMENT AND PLAN  Hyperglycemia, constipation   Plan: The patient had presented for hyperglycemia and constipation. Patient's labs did reveal hyperglycemia but otherwise was unremarkable.  He did not require any imaging during this visit and had no further complaints of abdominal pain.  He is encouraged to continue using MiraLAX.  He is cleared for discharge.   Laurence Aly,  MD   Note: This note was generated in part or whole with voice recognition software. Voice recognition is usually quite accurate but there are transcription errors that can and very often do occur. I apologize for any typographical errors that were not detected and corrected.    Earleen Newport, MD 07/30/17 2107

## 2017-07-30 NOTE — ED Notes (Signed)
Spoke with group home representative. Reported she is on the way to pick up the patient.

## 2017-07-30 NOTE — ED Triage Notes (Addendum)
First RN: Pt presents [facility] ED via [facility] with c/o continued R sided abdominal pain. Pt seen on 7/28 and sent home with prescription for Miralax, first dose given today. EMS reports CBG 407, 3 weeks ago psychiatrist changed Depakote and since then patient has had elevated blood sugars. Per EMS no recent abx, no diarrhea.  BP 136/70, 95% on RA. Upon triage assessment pt states pain when he laughs or coughs, denies pain any other time. Pt states CBG has been high for the last few weeks. Per last note by Dr. Karma Greaser it is confirmed that patient is his own legal guardian.

## 2017-07-30 NOTE — ED Notes (Signed)
Reviewed discharge instructions, follow-up care, and prescriptions with patient and patient's caregiver, [name]. Patient and caregiver verbalized understanding of all information reviewed. Patient stable, with no distress noted at this time.

## 2017-07-30 NOTE — ED Notes (Signed)
Rn attempted to call [name] to pick pt up as he is being discharged. [name]'s mailbox is full. Will try again shortly.

## 2017-07-30 NOTE — ED Notes (Signed)
Pt reports right side pain that started Friday and got increasingly worse since then - denies N/V/D - reports constipation (no BM since Saturday) - denies difficulty with urination

## 2017-07-31 ENCOUNTER — Other Ambulatory Visit: Payer: Self-pay

## 2017-07-31 ENCOUNTER — Other Ambulatory Visit
Admission: RE | Admit: 2017-07-31 | Discharge: 2017-07-31 | Disposition: A | Discharge status: Home / Self Care | Payer: 59 | Source: Ambulatory Visit | Attending: Internal Medicine | Admitting: Internal Medicine

## 2017-07-31 ENCOUNTER — Emergency Department
Admission: EM | Admit: 2017-07-31 | Discharge: 2017-07-31 | Disposition: A | Discharge status: Home / Self Care | Payer: 59 | Attending: Emergency Medicine | Admitting: Emergency Medicine

## 2017-07-31 DIAGNOSIS — Z87891 Personal history of nicotine dependence: Secondary | ICD-10-CM | POA: Insufficient documentation

## 2017-07-31 DIAGNOSIS — E1165 Type 2 diabetes mellitus with hyperglycemia: Secondary | ICD-10-CM | POA: Insufficient documentation

## 2017-07-31 DIAGNOSIS — Z79899 Other long term (current) drug therapy: Secondary | ICD-10-CM | POA: Diagnosis not present

## 2017-07-31 DIAGNOSIS — E039 Hypothyroidism, unspecified: Secondary | ICD-10-CM | POA: Diagnosis not present

## 2017-07-31 DIAGNOSIS — J45909 Unspecified asthma, uncomplicated: Secondary | ICD-10-CM | POA: Insufficient documentation

## 2017-07-31 DIAGNOSIS — Z7984 Long term (current) use of oral hypoglycemic drugs: Secondary | ICD-10-CM | POA: Insufficient documentation

## 2017-07-31 DIAGNOSIS — R739 Hyperglycemia, unspecified: Secondary | ICD-10-CM | POA: Insufficient documentation

## 2017-07-31 LAB — BLOOD GAS, ARTERIAL
Acid-Base Excess: 6.1 mmol/L — ABNORMAL HIGH (ref 0.0–2.0)
Allens test (pass/fail): POSITIVE — AB
Bicarbonate: 32.2 mmol/L — ABNORMAL HIGH (ref 20.0–28.0)
FIO2: 0.21
O2 Saturation: 92.7 %
Patient temperature: 37
pCO2 arterial: 52 mmHg — ABNORMAL HIGH (ref 32.0–48.0)
pH, Arterial: 7.4 (ref 7.350–7.450)
pO2, Arterial: 66 mmHg — ABNORMAL LOW (ref 83.0–108.0)

## 2017-07-31 LAB — URINALYSIS, COMPLETE (UACMP) WITH MICROSCOPIC
BILIRUBIN URINE: NEGATIVE
Bacteria, UA: NONE SEEN
Hgb urine dipstick: NEGATIVE
KETONES UR: NEGATIVE mg/dL
LEUKOCYTES UA: NEGATIVE
NITRITE: NEGATIVE
PH: 6 (ref 5.0–8.0)
Protein, ur: NEGATIVE mg/dL
Specific Gravity, Urine: 1.015 (ref 1.005–1.030)

## 2017-07-31 LAB — CBC
HCT: 35.5 % — ABNORMAL LOW (ref 40.0–52.0)
HEMOGLOBIN: 11.9 g/dL — AB (ref 13.0–18.0)
MCH: 28 pg (ref 26.0–34.0)
MCHC: 33.5 g/dL (ref 32.0–36.0)
MCV: 83.5 fL (ref 80.0–100.0)
Platelets: 202 10*3/uL (ref 150–440)
RBC: 4.26 MIL/uL — AB (ref 4.40–5.90)
RDW: 13.8 % (ref 11.5–14.5)
WBC: 4.4 10*3/uL (ref 3.8–10.6)

## 2017-07-31 LAB — BASIC METABOLIC PANEL
ANION GAP: 10 (ref 5–15)
BUN: 13 mg/dL (ref 6–20)
CALCIUM: 9 mg/dL (ref 8.9–10.3)
CHLORIDE: 96 mmol/L — AB (ref 98–111)
CO2: 28 mmol/L (ref 22–32)
Creatinine, Ser: 0.98 mg/dL (ref 0.61–1.24)
GFR calc Af Amer: >60 mL/min (ref >60)
GFR calc non Af Amer: >60 mL/min (ref >60)
GLUCOSE: 431 mg/dL — AB (ref 70–99)
Potassium: 4.1 mmol/L (ref 3.5–5.1)
Sodium: 134 mmol/L — ABNORMAL LOW (ref 135–145)

## 2017-07-31 MED ORDER — SODIUM CHLORIDE 0.9 % IV BOLUS
1000.0000 mL | Freq: Once | INTRAVENOUS | Status: AC
  Administered 2017-07-31: 1000 mL via INTRAVENOUS

## 2017-07-31 NOTE — Discharge Instructions (Addendum)
Please seek medical attention for any high fevers, chest pain, shortness of breath, change in behavior, persistent vomiting, bloody stool or any other new or concerning symptoms.  

## 2017-07-31 NOTE — ED Notes (Signed)
[name] with [facility] [phone]).  [name]'s number is [phone]. Either number can be reached for patient discharge.

## 2017-07-31 NOTE — ED Triage Notes (Addendum)
Pt arrives from PCP for abnormal labs on ABG. PCO2 52, p02 66. Ph 7.4. PCP wants pt evaluated for hypoxia, CO2, adjust psych meds, pulomary eval. Family asking if pt is going to be admitted.   Alert, oriented, ambulatory. No distress noted.   Person with pt states doctor just gave him 30 units insulin shot. States he'll be getting shots weekly now.

## 2017-07-31 NOTE — ED Notes (Signed)
ED Provider at bedside. 

## 2017-07-31 NOTE — ED Notes (Signed)
Caregiver called and informed that patient is up for discharge.  [name] verbalized understanding of information.

## 2017-07-31 NOTE — ED Provider Notes (Signed)
[facility] Emergency Department Provider Note   ____________________________________________   I have reviewed the triage vital signs and the nursing notes.   HISTORY  Chief Complaint Abnormal Lab   History limited by: Poor patient comprehension   HPI John [name]. is a 42 y.o. male who presents to the emergency department today sent by primary care physician because of apparent multiple concerns.  One was for low PaO2 reading on an ABG.  The patient does not know why the ABG was drawn and unfortunately do not see any documentation in the blood work why it ABG was performed.  Another was for CO2 retention that was noted on the ABG.  Furthermore according to a note the patient was sent here for psych med adjustment again for unclear reasons a pulmonary evaluation for apparently the ABG findings.  Per the handwritten note the primary care doctor recommended inpatient admission again for unclear reasons.    Per chart review the patient has been evaluated in the emergency department twice in the past 3 days for right-sided abdominal pain and hyperglycemia.  Work-up had been negative including CT scan done 3 days ago.    Past Medical History:  Diagnosis Date  . Anemia   . Asthma   . Diabetes mellitus   . Morbid obesity ([place])   . Paranoid schizophrenia ([place])   . Personality disorder ([place])   . Sleep apnea   . Thyroid disease     Patient Active Problem List   Diagnosis Date Noted  . Encephalopathy acute   . Somnolence 11/01/2016  . AKI (acute kidney injury) ([place]) 11/01/2016  . Sepsis ([place]) 04/27/2016  . Respiratory failure ([place]) 12/20/2015  . Cardiomyopathy ([place]) 11/28/2015  . Tobacco abuse 11/28/2015  . Acute respiratory failure with hypercapnia ([place])   . OSA (obstructive sleep apnea)   . Essential hypertension 11/25/2015  . Hypothyroidism 11/25/2015  . Paranoid schizophrenia, chronic condition ([place]) 11/25/2015  . Acute respiratory failure ([place])    . Acute exacerbation of COPD with asthma ([place])   . Dyslipidemia associated with type 2 diabetes mellitus ([place])   . Diabetes mellitus due to underlying condition without complication, without long-term current use of insulin ([place])   . Morbid obesity ([place]) 09/29/2008    Past Surgical History:  Procedure Laterality Date  . FRACTURE SURGERY      Prior to Admission medications   Medication Sig Start Date End Date Taking? Authorizing Provider  asenapine (SAPHRIS) 5 MG SUBL 24 hr tablet Place 1 tablet (5 mg total) 2 (two) times daily under the tongue. Patient taking differently: Place 10 mg under the tongue 2 (two) times daily.  11/13/16   Bettey Costa, MD  atenolol (TENORMIN) 50 MG tablet Take 1 tablet (50 mg total) daily by mouth. 11/14/16   Bettey Costa, MD  atorvastatin (LIPITOR) 10 MG tablet Take 10 mg by mouth daily.    [provider]  cloZAPine (CLOZARIL) 100 MG tablet Take 100 mg by mouth daily.    [provider]  divalproex (DEPAKOTE ER) 500 MG 24 hr tablet Take 2,000 mg by mouth at bedtime.     [provider]  fluticasone furoate-vilanterol (BREO ELLIPTA) 100-25 MCG/INH AEPB Inhale 1 puff into the lungs daily.    [provider]  furosemide (LASIX) 20 MG tablet Take 3 tablets (60 mg total) by mouth daily. 04/28/16   Hillary Bow, MD  glipiZIDE (GLUCOTROL) 5 MG tablet Take 0.5 tablets (2.5 mg total) daily before breakfast by  mouth. 11/13/16   Bettey Costa, MD  haloperidol (HALDOL) 5 MG tablet Take 5 mg by mouth 2 (two) times daily.     [provider]  ibuprofen (ADVIL,MOTRIN) 600 MG tablet Take 1 tablet (600 mg total) by mouth every 6 (six) hours as needed (foot pain). 01/04/17   Schaevitz, Randall An, MD  latanoprost (XALATAN) 0.005 % ophthalmic solution Place 1 drop into both eyes at bedtime.    [provider]  levothyroxine (SYNTHROID, LEVOTHROID) 75 MCG tablet Take 75 mcg by mouth daily.    [provider]   loratadine (CLARITIN) 10 MG tablet Take 10 mg by mouth daily.    [provider]  losartan (COZAAR) 50 MG tablet Take 50 mg by mouth daily.    [provider]  metFORMIN (GLUCOPHAGE) 500 MG tablet Take 1 tablet (500 mg total) by mouth 2 (two) times daily with a meal. 07/07/17 08/06/17  Arta Silence, MD  omeprazole (PRILOSEC) 20 MG capsule Take 20 mg by mouth daily.    [provider]  oxybutynin (DITROPAN) 5 MG tablet Take 5 mg by mouth daily.    [provider]  polyethylene glycol (MIRALAX / GLYCOLAX) packet Take 17 g by mouth daily. Mix with water or juice as listed on the label. 07/28/17   Hinda Kehr, MD  triamcinolone cream (KENALOG) 0.1 % Apply 1 application topically 2 (two) times daily as needed.     [provider]    Allergies Patient has no known allergies.  Family History  Problem Relation Age of Onset  . Glaucoma Mother     Social History Social History   Tobacco Use  . Smoking status: Former Research scientist (life sciences)  . Smokeless tobacco: Never Used  Substance Use Topics  . Alcohol use: Yes    Comment: only once in a while  . Drug use: No    Review of Systems Constitutional: No fever/chills Eyes: No visual changes. ENT: No sore throat. Cardiovascular: Denies chest pain. Respiratory: Denies shortness of breath. Gastrointestinal: Positive right-sided abdominal pain Genitourinary: Negative for dysuria. Musculoskeletal: Negative for back pain. Skin: Negative for rash. Neurological: Negative for headaches, focal weakness or numbness.  ____________________________________________   PHYSICAL EXAM:  VITAL SIGNS: ED Triage Vitals  Enc Vitals Group     BP 07/31/17 1436 100/62     Pulse Rate 07/31/17 1436 100     Resp 07/31/17 1436 16     Temp 07/31/17 1436 97.7 F (36.5 C)     Temp Source 07/31/17 1436 Oral     SpO2 07/31/17 1436 94 %     Weight 07/31/17 1437 (!) 322 lb (146.1 kg)     Height 07/31/17 1437 6' (1.829 m)      Head Circumference --      Peak Flow --      Pain Score 07/31/17 1437 7   Constitutional: Alert and oriented.  Eyes: Conjunctivae are normal.  ENT      Head: Normocephalic and atraumatic.      Nose: No congestion/rhinnorhea.      Mouth/Throat: Mucous membranes are moist.      Neck: No stridor. Hematological/Lymphatic/Immunilogical: No cervical lymphadenopathy. Cardiovascular: Normal rate, regular rhythm.  No murmurs, rubs, or gallops.  Respiratory: Normal respiratory effort without tachypnea nor retractions. Breath sounds are clear and equal bilaterally. No wheezes/rales/rhonchi. Gastrointestinal: Soft and non tender. No rebound. No guarding.  Genitourinary: Deferred Musculoskeletal: Normal range of motion in all extremities. No lower extremity edema. Neurologic:  Normal speech and language.  No gross focal neurologic deficits are appreciated.  Skin:  Skin is warm, dry and intact. No rash noted. Psychiatric: Mood and affect are normal. Speech and behavior are normal. Patient exhibits appropriate insight and judgment.  ____________________________________________    LABS (pertinent positives/negatives)  UA not consistent with infection BMP na 134, k 4.1, glu 431, cr 0.98 CBC wbc 4.4, hgb 11.9, plt 202  ____________________________________________   EKG  None  ____________________________________________    RADIOLOGY  None  ____________________________________________   PROCEDURES  Procedures  ____________________________________________   INITIAL IMPRESSION / ASSESSMENT AND PLAN / ED COURSE  Pertinent labs & imaging results that were available during my care of the patient were reviewed by me and considered in my medical decision making (see chart for details).   Patient presented to the emergency department today because of unclear reasons.  He had an ABG done by primary care for unclear reasons.  While did show a low PaO2 the patient's oxygen saturations were  fine and he has had history of low PaO2's.   Additionally the carbon dioxide was little bit elevated however has been the best it has been for the patient when compared to previous recent ABGs.  Furthermore there was a note saying that he needed his psych meds adjusted.  Unclear reason why.  Patient denies SI HI.  He is calm and collected in the exam room.  I do feel like this can be done as an outpatient.  This point do not think patient requires emergent pulmonary evaluation.  If primary care wants to set up pulmonary appointment they certainly can.  ____________________________________________   FINAL CLINICAL IMPRESSION(S) / ED DIAGNOSES  Final diagnoses:  Hyperglycemia     Note: This dictation was prepared with Dragon dictation. Any transcriptional errors that result from this process are unintentional     Nance Pear, MD 07/31/17 1758

## 2017-07-31 NOTE — ED Notes (Signed)
Sent a rainbow down to lab.

## 2017-08-01 ENCOUNTER — Emergency Department: Payer: 59

## 2017-08-01 ENCOUNTER — Emergency Department
Admission: EM | Admit: 2017-08-01 | Discharge: 2017-08-02 | Disposition: A | Discharge status: Home / Self Care | Payer: 59 | Attending: Emergency Medicine | Admitting: Emergency Medicine

## 2017-08-01 ENCOUNTER — Other Ambulatory Visit: Payer: Self-pay

## 2017-08-01 DIAGNOSIS — Z7984 Long term (current) use of oral hypoglycemic drugs: Secondary | ICD-10-CM | POA: Diagnosis not present

## 2017-08-01 DIAGNOSIS — D4989 Neoplasm of unspecified behavior of other specified sites: Secondary | ICD-10-CM

## 2017-08-01 DIAGNOSIS — Z87891 Personal history of nicotine dependence: Secondary | ICD-10-CM | POA: Diagnosis not present

## 2017-08-01 DIAGNOSIS — E119 Type 2 diabetes mellitus without complications: Secondary | ICD-10-CM | POA: Insufficient documentation

## 2017-08-01 DIAGNOSIS — J45909 Unspecified asthma, uncomplicated: Secondary | ICD-10-CM | POA: Diagnosis not present

## 2017-08-01 DIAGNOSIS — D150 Benign neoplasm of thymus: Secondary | ICD-10-CM | POA: Diagnosis not present

## 2017-08-01 DIAGNOSIS — Z01812 Encounter for preprocedural laboratory examination: Secondary | ICD-10-CM | POA: Diagnosis present

## 2017-08-01 DIAGNOSIS — R791 Abnormal coagulation profile: Secondary | ICD-10-CM | POA: Insufficient documentation

## 2017-08-01 DIAGNOSIS — R7989 Other specified abnormal findings of blood chemistry: Secondary | ICD-10-CM

## 2017-08-01 LAB — BLOOD GAS, ARTERIAL
Acid-Base Excess: 7.4 mmol/L — ABNORMAL HIGH (ref 0.0–2.0)
BICARBONATE: 32.6 mmol/L — AB (ref 20.0–28.0)
FIO2: 0.21
O2 Saturation: 93.7 %
PCO2 ART: 48 mmHg (ref 32.0–48.0)
PH ART: 7.44 (ref 7.350–7.450)
Patient temperature: 37
pO2, Arterial: 67 mmHg — ABNORMAL LOW (ref 83.0–108.0)

## 2017-08-01 LAB — GLUCOSE, CAPILLARY: Glucose-Capillary: 166 mg/dL — ABNORMAL HIGH (ref 70–99)

## 2017-08-01 LAB — FIBRIN DERIVATIVES D-DIMER (ARMC ONLY): Fibrin derivatives D-dimer (ARMC): 519.55 ng/mL (FEU) — ABNORMAL HIGH (ref 0.00–499.00)

## 2017-08-01 MED ORDER — IOHEXOL 350 MG/ML SOLN
100.0000 mL | Freq: Once | INTRAVENOUS | Status: AC | PRN
  Administered 2017-08-01: 100 mL via INTRAVENOUS

## 2017-08-01 NOTE — ED Triage Notes (Addendum)
Pt arrives to ED. Group home manager sent pt to ED to have ABG drawn d/t PCP telling her that pt had abnormal blood gas in office. Was seen yesterday. No complaints by pt. All pt states it "I came in and sat on the floor and they told me I had to go to the office." no resp distress noted. Pt has nonlabored, even respirations. 98% RA in triage.

## 2017-08-01 NOTE — ED Notes (Addendum)
Respiratory informed of ABG order. Stated to call RT back once pt is roomed to obtain ABG.

## 2017-08-01 NOTE — ED Provider Notes (Addendum)
[facility] Emergency Department Provider Note  ____________________________________________   I have reviewed the triage vital signs and the nursing notes. Where available I have reviewed prior notes and, if possible and indicated, outside hospital notes.    HISTORY  Chief Complaint Labs Only    HPI John [name]. is a 42 y.o. male sent in here by his primary care doctor for the third day in a row out of concerns apparently for an ABG that was obtained several days ago that was of concern for possible CO2 retention.  Dr. Rebecka Apley has not returned any of his pages since the patient arrived therefore I cannot verify the story.  Patient has a history of paranoid schizophrenia and personality disorder sleep apnea and multiple other medical problems, and he has no complaints except for he has pain on the right side of his abdomen which is been there for some time.  He had a CT scan for that a few days ago.  Patient has no complaints, he has no concerns and he does not know why he is continually sent to the emergency department.   Level 5 chart caveat; no further history available due to patient status.  Past Medical History:  Diagnosis Date  . Anemia   . Asthma   . Diabetes mellitus   . Morbid obesity ([place])   . Paranoid schizophrenia ([place])   . Personality disorder ([place])   . Sleep apnea   . Thyroid disease     Patient Active Problem List   Diagnosis Date Noted  . Encephalopathy acute   . Somnolence 11/01/2016  . AKI (acute kidney injury) ([place]) 11/01/2016  . Sepsis ([place]) 04/27/2016  . Respiratory failure ([place]) 12/20/2015  . Cardiomyopathy ([place]) 11/28/2015  . Tobacco abuse 11/28/2015  . Acute respiratory failure with hypercapnia ([place])   . OSA (obstructive sleep apnea)   . Essential hypertension 11/25/2015  . Hypothyroidism 11/25/2015  . Paranoid schizophrenia, chronic condition ([place]) 11/25/2015  . Acute respiratory failure ([place])   . Acute exacerbation  of COPD with asthma ([place])   . Dyslipidemia associated with type 2 diabetes mellitus ([place])   . Diabetes mellitus due to underlying condition without complication, without long-term current use of insulin ([place])   . Morbid obesity ([place]) 09/29/2008    Past Surgical History:  Procedure Laterality Date  . FRACTURE SURGERY      Prior to Admission medications   Medication Sig Start Date End Date Taking? Authorizing Provider  asenapine (SAPHRIS) 5 MG SUBL 24 hr tablet Place 1 tablet (5 mg total) 2 (two) times daily under the tongue. Patient taking differently: Place 10 mg under the tongue 2 (two) times daily.  11/13/16   Bettey Costa, MD  atenolol (TENORMIN) 50 MG tablet Take 1 tablet (50 mg total) daily by mouth. 11/14/16   Bettey Costa, MD  atorvastatin (LIPITOR) 10 MG tablet Take 10 mg by mouth daily.    [provider]  cloZAPine (CLOZARIL) 100 MG tablet Take 100 mg by mouth daily.    [provider]  divalproex (DEPAKOTE ER) 500 MG 24 hr tablet Take 2,000 mg by mouth at bedtime.     [provider]  fluticasone furoate-vilanterol (BREO ELLIPTA) 100-25 MCG/INH AEPB Inhale 1 puff into the lungs daily.    [provider]  furosemide (LASIX) 20 MG tablet Take 3 tablets (60 mg total) by mouth daily. 04/28/16   Hillary Bow, MD  glipiZIDE (GLUCOTROL) 5 MG tablet Take 0.5 tablets (2.5 mg  total) daily before breakfast by mouth. 11/13/16   Bettey Costa, MD  haloperidol (HALDOL) 5 MG tablet Take 5 mg by mouth 2 (two) times daily.     [provider]  ibuprofen (ADVIL,MOTRIN) 600 MG tablet Take 1 tablet (600 mg total) by mouth every 6 (six) hours as needed (foot pain). 01/04/17   Schaevitz, Randall An, MD  latanoprost (XALATAN) 0.005 % ophthalmic solution Place 1 drop into both eyes at bedtime.    [provider]  levothyroxine (SYNTHROID, LEVOTHROID) 75 MCG tablet Take 75 mcg by mouth daily.    [provider]  loratadine (CLARITIN) 10 MG  tablet Take 10 mg by mouth daily.    [provider]  losartan (COZAAR) 50 MG tablet Take 50 mg by mouth daily.    [provider]  metFORMIN (GLUCOPHAGE) 500 MG tablet Take 1 tablet (500 mg total) by mouth 2 (two) times daily with a meal. 07/07/17 08/06/17  Arta Silence, MD  omeprazole (PRILOSEC) 20 MG capsule Take 20 mg by mouth daily.    [provider]  oxybutynin (DITROPAN) 5 MG tablet Take 5 mg by mouth daily.    [provider]  polyethylene glycol (MIRALAX / GLYCOLAX) packet Take 17 g by mouth daily. Mix with water or juice as listed on the label. 07/28/17   Hinda Kehr, MD  triamcinolone cream (KENALOG) 0.1 % Apply 1 application topically 2 (two) times daily as needed.     [provider]    Allergies Patient has no known allergies.  Family History  Problem Relation Age of Onset  . Glaucoma Mother     Social History Social History   Tobacco Use  . Smoking status: Former Research scientist (life sciences)  . Smokeless tobacco: Never Used  Substance Use Topics  . Alcohol use: Yes    Comment: only once in a while  . Drug use: No    Review of Systems Constitutional: No fever/chills Eyes: No visual changes. ENT: No sore throat. No stiff neck no neck pain Cardiovascular: Denies chest pain. Respiratory: Denies shortness of breath. Gastrointestinal:   no vomiting.  No diarrhea.  No constipation. Genitourinary: Negative for dysuria. Musculoskeletal: Negative lower extremity swelling Skin: Negative for rash. Neurological: Negative for severe headaches, focal weakness or numbness.   ____________________________________________   PHYSICAL EXAM:  VITAL SIGNS: ED Triage Vitals  Enc Vitals Group     BP 08/01/17 1653 112/68     Pulse Rate 08/01/17 1653 85     Resp 08/01/17 1653 16     Temp 08/01/17 1653 97.7 F (36.5 C)     Temp Source 08/01/17 1653 Oral     SpO2 08/01/17 1653 98 %     Weight 08/01/17 1654 (!) 322 lb (146.1 kg)     Height  08/01/17 1654 6' (1.829 m)     Head Circumference --      Peak Flow --      Pain Score 08/01/17 1654 0     Pain Loc --      Pain Edu? --      Excl. in [place]? --     Constitutional: Alert and oriented name and place unsure of the date, smiling and laughing. Well appearing and in no acute distress. Eyes: Conjunctivae are normal Head: Atraumatic HEENT: No congestion/rhinnorhea. Mucous membranes are moist.  Oropharynx non-erythematous Neck:   Nontender with no meningismus, no masses, no stridor Cardiovascular: Normal rate, regular rhythm. Grossly normal heart sounds.  Good peripheral circulation. Respiratory: Normal respiratory  effort.  No retractions. Lungs CTAB. Abdominal: Soft and slight tenderness to palpation to the ribs on the right which causes patient to say "ouch that the pain right there" pullback no crepitus no flail chest, no rib fracture palpated. No distention. No guarding no rebound Back:  There is no focal tenderness or step off.  there is no midline tenderness there are no lesions noted. there is no CVA tenderness Musculoskeletal: No lower extremity tenderness, no upper extremity tenderness. No joint effusions, no DVT signs strong distal pulses no edema Neurologic:  Normal speech and language. No gross focal neurologic deficits are appreciated.  Skin:  Skin is warm, dry and intact. No rash noted. Psychiatric: Mood and affect are normal. Speech and behavior are normal.  ____________________________________________   LABS (all labs ordered are listed, but only abnormal results are displayed)  Labs Reviewed  BLOOD GAS, ARTERIAL - Abnormal; Notable for the following components:      Result Value   pO2, Arterial 67 (*)    Bicarbonate 32.6 (*)    Acid-Base Excess 7.4 (*)    All other components within normal limits  GLUCOSE, CAPILLARY - Abnormal; Notable for the following components:   Glucose-Capillary 166 (*)    All other components within normal limits  CBG MONITORING, ED     Pertinent labs  results that were available during my care of the patient were reviewed by me and considered in my medical decision making (see chart for details). ____________________________________________  EKG  I personally interpreted any EKGs ordered by me or triage  ____________________________________________  RADIOLOGY  Pertinent labs & imaging results that were available during my care of the patient were reviewed by me and considered in my medical decision making (see chart for details). If possible, patient and/or family made aware of any abnormal findings.  Dg Chest 1 View  Result Date: 08/01/2017 CLINICAL DATA:  Abnormal blood gas, asthma EXAM: CHEST  1 VIEW COMPARISON:  07/20/2017 FINDINGS: Lungs are clear.  No pleural effusion or pneumothorax. The heart is normal in size. IMPRESSION: No evidence of acute cardiopulmonary disease. Electronically Signed   By: Julian Hy M.D.   On: 08/01/2017 17:43   ____________________________________________    PROCEDURES  Procedure(s) performed: None  Procedures  Critical Care performed: None  ____________________________________________   INITIAL IMPRESSION / ASSESSMENT AND PLAN / ED COURSE  Pertinent labs & imaging results that were available during my care of the patient were reviewed by me and considered in my medical decision making (see chart for details).  Patient here with no complaints normal vitals, did an ABG at the request of an outside physician which is reassuring.  His oxygen level is slightly low he does have a history of OSA and probably some degree of hypoventilation, CBGs have looked like this at least since November 2018 with some mild fluctuation in his oxygen levels which are again slightly low, however his sats are 100% on room air and there is no indication of respiratory distress.  He has some very reproducible pain on the right side of his abdomen for which she is been worked up already in this  department.  He has no signs or symptoms of a PE, and no real risk factors for PE.  It is unclear to me why the patient continually is sent in here, however we did try to oblige their physician by doing a chest x-ray and a repeat BG.  He is not in DKA.  I do not see any  acute pathology here today he has no SI no HI, certainly is to complain about things as needed.  Given that I cannot get the physician on the phone who is sending him in and I see no acute pathology that requires emergent treatment here, my inclination will be to discharge him although I think this might be inconvenient for the patient I do not have anything acute I can treat him. ----------------------------------------- 9:09 PM on 08/01/2017 -----------------------------------------  As a precaution and because of multiple repeat visits to the emergency room, with some repeatable chest wall pain which sometimes can be try underlying pathology, I did do a d-dimer which was positive CT scan is negative for PE.  There is trace pulmonary edema but nothing significant there is no pneumonia there is no pneumothorax, there is no PE, we will have him follow closely with cardiology, sats have never gone below 98% while he is been here that I have seen, he has no complaints and no distress, is repeatedly been sent in here but we do not see any acute pathology today to warrant admission.  He has no SI or HI, try paging his primary care doctor for further formation about why the patient has been repeatedly sent in, but we cannot get a hold of him despite several pages.  At this time I do not see any further work-up is indicated.  I am sending a discharge instruction with the patient with all the findings on CT and recommending follow-up closely with CT surgeon per radiology request, cardiology and PCP.  At this point, no emergent situation or condition has been identified during medical screening exam, will give the patient extensive outpatient follow-up  and return precautions.   ____________________________________________   FINAL CLINICAL IMPRESSION(S) / ED DIAGNOSES  Final diagnoses:  None      This chart was dictated using voice recognition software.  Despite best efforts to proofread,  errors can occur which can change meaning.      Schuyler Amor, MD 08/01/17 1817    Schuyler Amor, MD 08/01/17 2112

## 2017-08-01 NOTE — Discharge Instructions (Addendum)
Found the following things on your chest x-ray please bring this information to your primary care doctor. No evidence of pulmonary embolus.  Bilateral small pleural effusions with mild interstitial pulmonary edema.  Stable multinodular soft tissue within the anterior mediastinum. Given the relative stability since 2015, this most likely represents a thymoma versus residual thymic tissue. Consultation with cardiothoracic surgery is recommended, if one has not been performed.  Left prevascular lymph node measures 13 mm, stable from the last.    Return to the emergency room for any new or worrisome symptoms call your doctor first thing tomorrow.  Take 1 extra dose of 20 mg of Lasix a day for the next 3 days.

## 2017-08-01 NOTE — ED Notes (Signed)
Attempted to call group home and manager for pt discharge, unable to reach after numerous attempts. [employer] called to go to group home and make them aware.

## 2017-08-01 NOTE — ED Notes (Signed)
Informed RN that patient has been roomed and is ready for evaluation.  Patient in NAD at this time and call bell placed within reach.   

## 2017-08-02 ENCOUNTER — Other Ambulatory Visit: Payer: Self-pay | Admitting: Internal Medicine

## 2017-08-02 ENCOUNTER — Ambulatory Visit
Admission: RE | Admit: 2017-08-02 | Discharge: 2017-08-02 | Disposition: A | Discharge status: Home / Self Care | Payer: 59 | Source: Ambulatory Visit | Attending: Internal Medicine | Admitting: Internal Medicine

## 2017-08-02 DIAGNOSIS — M7989 Other specified soft tissue disorders: Secondary | ICD-10-CM

## 2017-08-02 DIAGNOSIS — R7989 Other specified abnormal findings of blood chemistry: Secondary | ICD-10-CM

## 2017-08-02 DIAGNOSIS — R791 Abnormal coagulation profile: Secondary | ICD-10-CM | POA: Diagnosis not present

## 2017-08-02 NOTE — ED Notes (Signed)
Group home contacted and on way to pick pt up.

## 2017-08-02 NOTE — ED Notes (Signed)
Group home member to ED for pt pickup. Pt walked to [place] and handed off to group home member.

## 2018-03-05 DIAGNOSIS — Z0489 Encounter for examination and observation for other specified reasons: Secondary | ICD-10-CM | POA: Diagnosis not present

## 2018-08-21 ENCOUNTER — Emergency Department
Admission: EM | Admit: 2018-08-21 | Discharge: 2018-08-21 | Disposition: A | Discharge status: Home / Self Care | Payer: 59 | Attending: Emergency Medicine | Admitting: Emergency Medicine

## 2018-08-21 ENCOUNTER — Encounter: Payer: Self-pay | Admitting: Emergency Medicine

## 2018-08-21 ENCOUNTER — Other Ambulatory Visit: Payer: Self-pay

## 2018-08-21 ENCOUNTER — Emergency Department: Payer: 59

## 2018-08-21 DIAGNOSIS — I10 Essential (primary) hypertension: Secondary | ICD-10-CM | POA: Insufficient documentation

## 2018-08-21 DIAGNOSIS — J449 Chronic obstructive pulmonary disease, unspecified: Secondary | ICD-10-CM | POA: Insufficient documentation

## 2018-08-21 DIAGNOSIS — Z79899 Other long term (current) drug therapy: Secondary | ICD-10-CM | POA: Insufficient documentation

## 2018-08-21 DIAGNOSIS — L03115 Cellulitis of right lower limb: Secondary | ICD-10-CM

## 2018-08-21 DIAGNOSIS — E039 Hypothyroidism, unspecified: Secondary | ICD-10-CM | POA: Insufficient documentation

## 2018-08-21 DIAGNOSIS — Z7984 Long term (current) use of oral hypoglycemic drugs: Secondary | ICD-10-CM | POA: Diagnosis not present

## 2018-08-21 DIAGNOSIS — J45909 Unspecified asthma, uncomplicated: Secondary | ICD-10-CM | POA: Diagnosis not present

## 2018-08-21 DIAGNOSIS — E119 Type 2 diabetes mellitus without complications: Secondary | ICD-10-CM | POA: Diagnosis not present

## 2018-08-21 DIAGNOSIS — Z87891 Personal history of nicotine dependence: Secondary | ICD-10-CM | POA: Diagnosis not present

## 2018-08-21 DIAGNOSIS — R2241 Localized swelling, mass and lump, right lower limb: Secondary | ICD-10-CM | POA: Diagnosis present

## 2018-08-21 DIAGNOSIS — M1000 Idiopathic gout, unspecified site: Secondary | ICD-10-CM | POA: Diagnosis not present

## 2018-08-21 LAB — COMPREHENSIVE METABOLIC PANEL
ALT: 14 U/L (ref 0–44)
AST: 17 U/L (ref 15–41)
Albumin: 3.7 g/dL (ref 3.5–5.0)
Alkaline Phosphatase: 45 U/L (ref 38–126)
Anion gap: 10 (ref 5–15)
BUN: 10 mg/dL (ref 6–20)
CO2: 27 mmol/L (ref 22–32)
Calcium: 9 mg/dL (ref 8.9–10.3)
Chloride: 98 mmol/L (ref 98–111)
Creatinine, Ser: 0.97 mg/dL (ref 0.61–1.24)
GFR calc Af Amer: >60 mL/min (ref >60)
GFR calc non Af Amer: >60 mL/min (ref >60)
Glucose, Bld: 194 mg/dL — ABNORMAL HIGH (ref 70–99)
Potassium: 3.5 mmol/L (ref 3.5–5.1)
Sodium: 135 mmol/L (ref 135–145)
Total Bilirubin: 0.5 mg/dL (ref 0.3–1.2)
Total Protein: 8.5 g/dL — ABNORMAL HIGH (ref 6.5–8.1)

## 2018-08-21 LAB — URINALYSIS, COMPLETE (UACMP) WITH MICROSCOPIC
Bacteria, UA: NONE SEEN
Bilirubin Urine: NEGATIVE
Glucose, UA: NEGATIVE mg/dL
Hgb urine dipstick: NEGATIVE
Ketones, ur: NEGATIVE mg/dL
Leukocytes,Ua: NEGATIVE
Nitrite: NEGATIVE
Protein, ur: NEGATIVE mg/dL
Specific Gravity, Urine: 1.006 (ref 1.005–1.030)
Squamous Epithelial / HPF: NONE SEEN (ref 0–5)
pH: 6 (ref 5.0–8.0)

## 2018-08-21 LAB — CBC WITH DIFFERENTIAL/PLATELET
Abs Immature Granulocytes: 0.03 10*3/uL (ref 0.00–0.07)
Basophils Absolute: 0 10*3/uL (ref 0.0–0.1)
Basophils Relative: 0 %
Eosinophils Absolute: 0 10*3/uL (ref 0.0–0.5)
Eosinophils Relative: 0 %
HCT: 38.5 % — ABNORMAL LOW (ref 39.0–52.0)
Hemoglobin: 11.8 g/dL — ABNORMAL LOW (ref 13.0–17.0)
Immature Granulocytes: 1 %
Lymphocytes Relative: 21 %
Lymphs Abs: 1.2 10*3/uL (ref 0.7–4.0)
MCH: 25.9 pg — ABNORMAL LOW (ref 26.0–34.0)
MCHC: 30.6 g/dL (ref 30.0–36.0)
MCV: 84.4 fL (ref 80.0–100.0)
Monocytes Absolute: 0.5 10*3/uL (ref 0.1–1.0)
Monocytes Relative: 8 %
Neutro Abs: 4.1 10*3/uL (ref 1.7–7.7)
Neutrophils Relative %: 70 %
Platelets: 235 10*3/uL (ref 150–400)
RBC: 4.56 MIL/uL (ref 4.22–5.81)
RDW: 13.9 % (ref 11.5–15.5)
WBC: 5.8 10*3/uL (ref 4.0–10.5)
nRBC: 0 % (ref 0.0–0.2)

## 2018-08-21 LAB — URIC ACID: Uric Acid, Serum: 9.7 mg/dL — ABNORMAL HIGH (ref 3.7–8.6)

## 2018-08-21 MED ORDER — COLCHICINE 0.6 MG PO TABS: 0.6000 mg | ORAL_TABLET | Freq: Every day | ORAL | 2 refills | Status: DC

## 2018-08-21 MED ORDER — CEPHALEXIN 500 MG PO CAPS: 500.0000 mg | ORAL_CAPSULE | Freq: Three times a day (TID) | ORAL | 0 refills | Status: AC

## 2018-08-21 MED ORDER — PIPERACILLIN-TAZOBACTAM 3.375 G IVPB 30 MIN
3.3750 g | Freq: Once | INTRAVENOUS | Status: AC
  Administered 2018-08-21: 3.375 g via INTRAVENOUS
  Filled 2018-08-21: qty 50

## 2018-08-21 MED ORDER — CEPHALEXIN 500 MG PO CAPS: 500.0000 mg | ORAL_CAPSULE | Freq: Three times a day (TID) | ORAL | 0 refills | Status: DC

## 2018-08-21 NOTE — ED Provider Notes (Signed)
[facility] Emergency Department Provider Note  ____________________________________________   First MD Initiated Contact with Patient 08/21/18 1244     (approximate)  I have reviewed the triage vital signs and the nursing notes.   HISTORY  Chief Complaint Foot Swelling    HPI John [name]. is a 43 y.o. male presents emergency department complaining of right foot swelling for approximately 2 weeks.  He states the area has been painful and swollen.  A little red.  Patient has history of diabetes.  Lives in a group home.  I discussed this with his mother and she states she noted that he could not get his foot into his flip-flop.  She states he does have a history of gout.  He denies any fever or chills.    Past Medical History:  Diagnosis Date  . Anemia   . Asthma   . Diabetes mellitus   . Morbid obesity ([place])   . Paranoid schizophrenia ([place])   . Personality disorder ([place])   . Sleep apnea   . Thyroid disease     Patient Active Problem List   Diagnosis Date Noted  . Encephalopathy acute   . Somnolence 11/01/2016  . AKI (acute kidney injury) ([place]) 11/01/2016  . Sepsis ([place]) 04/27/2016  . Respiratory failure ([place]) 12/20/2015  . Cardiomyopathy ([place]) 11/28/2015  . Tobacco abuse 11/28/2015  . Acute respiratory failure with hypercapnia ([place])   . OSA (obstructive sleep apnea)   . Essential hypertension 11/25/2015  . Hypothyroidism 11/25/2015  . Paranoid schizophrenia, chronic condition ([place]) 11/25/2015  . Acute respiratory failure ([place])   . Acute exacerbation of COPD with asthma ([place])   . Dyslipidemia associated with type 2 diabetes mellitus ([place])   . Diabetes mellitus due to underlying condition without complication, without long-term current use of insulin ([place])   . Morbid obesity ([place]) 09/29/2008    Past Surgical History:  Procedure Laterality Date  . FRACTURE SURGERY      Prior to Admission medications   Medication Sig Start Date End  Date Taking? Authorizing Provider  asenapine (SAPHRIS) 5 MG SUBL 24 hr tablet Place 1 tablet (5 mg total) 2 (two) times daily under the tongue. Patient taking differently: Place 10 mg under the tongue 2 (two) times daily.  11/13/16   Bettey Costa, MD  atenolol (TENORMIN) 50 MG tablet Take 1 tablet (50 mg total) daily by mouth. 11/14/16   Bettey Costa, MD  atorvastatin (LIPITOR) 10 MG tablet Take 10 mg by mouth daily.    [provider]  cephALEXin (KEFLEX) 500 MG capsule Take 1 capsule (500 mg total) by mouth 3 (three) times daily for 7 days. 08/21/18 08/28/18  , Linden Dolin, PA-C  cloZAPine (CLOZARIL) 100 MG tablet Take 100 mg by mouth daily.    [provider]  colchicine 0.6 MG tablet Take 1 tablet (0.6 mg total) by mouth daily. 08/21/18 08/21/19  Caryn Section Linden Dolin, PA-C  divalproex (DEPAKOTE ER) 500 MG 24 hr tablet Take 2,000 mg by mouth at bedtime.     [provider]  fluticasone furoate-vilanterol (BREO ELLIPTA) 100-25 MCG/INH AEPB Inhale 1 puff into the lungs daily.    [provider]  furosemide (LASIX) 20 MG tablet Take 3 tablets (60 mg total) by mouth daily. 04/28/16   Hillary Bow, MD  glipiZIDE (GLUCOTROL) 5 MG tablet Take 0.5 tablets (2.5 mg total) daily before breakfast by mouth. 11/13/16   Bettey Costa, MD  haloperidol (HALDOL) 5 MG tablet Take 5  mg by mouth 2 (two) times daily.     [provider]  ibuprofen (ADVIL,MOTRIN) 600 MG tablet Take 1 tablet (600 mg total) by mouth every 6 (six) hours as needed (foot pain). 01/04/17   Schaevitz, Randall An, MD  latanoprost (XALATAN) 0.005 % ophthalmic solution Place 1 drop into both eyes at bedtime.    [provider]  levothyroxine (SYNTHROID, LEVOTHROID) 75 MCG tablet Take 75 mcg by mouth daily.    [provider]  loratadine (CLARITIN) 10 MG tablet Take 10 mg by mouth daily.    [provider]  losartan (COZAAR) 50 MG tablet Take 50 mg by mouth daily.    [provider]  metFORMIN (GLUCOPHAGE) 500 MG tablet Take 1 tablet (500 mg total) by mouth 2 (two) times daily with a meal. 07/07/17 08/06/17  Arta Silence, MD  omeprazole (PRILOSEC) 20 MG capsule Take 20 mg by mouth daily.    [provider]  oxybutynin (DITROPAN) 5 MG tablet Take 5 mg by mouth daily.    [provider]  polyethylene glycol (MIRALAX / GLYCOLAX) packet Take 17 g by mouth daily. Mix with water or juice as listed on the label. 07/28/17   Hinda Kehr, MD  triamcinolone cream (KENALOG) 0.1 % Apply 1 application topically 2 (two) times daily as needed.     [provider]    Allergies Patient has no known allergies.  Family History  Problem Relation Age of Onset  . Glaucoma Mother     Social History Social History   Tobacco Use  . Smoking status: Former Research scientist (life sciences)  . Smokeless tobacco: Never Used  Substance Use Topics  . Alcohol use: Yes    Comment: only once in a while  . Drug use: No    Review of Systems  Constitutional: No fever/chills Eyes: No visual changes. ENT: No sore throat. Respiratory: Denies cough Genitourinary: Negative for dysuria. Musculoskeletal: Negative for back pain.  Positive for right foot redness and swelling Skin: Negative for rash.    ____________________________________________   PHYSICAL EXAM:  VITAL SIGNS: ED Triage Vitals  Enc Vitals Group     BP 08/21/18 1204 126/76     Pulse Rate 08/21/18 1204 92     Resp 08/21/18 1204 18     Temp 08/21/18 1204 98 F (36.7 C)     Temp src --      SpO2 08/21/18 1204 97 %     Weight 08/21/18 1206 (!) 308 lb (139.7 kg)     Height 08/21/18 1206 6' (1.829 m)     Head Circumference --      Peak Flow --      Pain Score 08/21/18 1206 8     Pain Loc --      Pain Edu? --      Excl. in [place]? --     Constitutional: Alert and oriented. Well appearing and in no acute distress. Eyes: Conjunctivae are normal.  Head: Atraumatic. Nose: No congestion/rhinnorhea.  Mouth/Throat: Mucous membranes are moist.   Neck:  supple no lymphadenopathy noted Cardiovascular: Normal rate, regular rhythm.  Respiratory: Normal respiratory effort.  No retractions,  GU: deferred Musculoskeletal: The right foot is red and swollen with some exudate noted between the toes.  No open wounds are noted.  Calf is nontender.  Neurovascular is intact neurologic:  Normal speech and language.  Skin:  Skin is warm, dry and intact. No rash noted. Psychiatric: Mood and affect are normal. Speech and behavior are  normal.  ____________________________________________   LABS (all labs ordered are listed, but only abnormal results are displayed)  Labs Reviewed  CBC WITH DIFFERENTIAL/PLATELET - Abnormal; Notable for the following components:      Result Value   Hemoglobin 11.8 (*)    HCT 38.5 (*)    MCH 25.9 (*)    All other components within normal limits  COMPREHENSIVE METABOLIC PANEL - Abnormal; Notable for the following components:   Glucose, Bld 194 (*)    Total Protein 8.5 (*)    All other components within normal limits  URIC ACID - Abnormal; Notable for the following components:   Uric Acid, Serum 9.7 (*)    All other components within normal limits  URINALYSIS, COMPLETE (UACMP) WITH MICROSCOPIC - Abnormal; Notable for the following components:   Color, Urine STRAW (*)    APPearance CLEAR (*)    All other components within normal limits   ____________________________________________   ____________________________________________  RADIOLOGY  X-ray of the right foot is negative except for swelling along the dorsum  ____________________________________________   PROCEDURES  Procedure(s) performed: zosyn iv  Procedures    ____________________________________________   INITIAL IMPRESSION / ASSESSMENT AND PLAN / ED COURSE  Pertinent labs & imaging results that were available during my care of the patient were reviewed by me and considered in my medical  decision making (see chart for details).   Patient is 43 year old male presents emergency department via EMS from a group home.  Complaint of redness and swelling to the right foot for 2 weeks.  Patient has history of diabetes and gout.  Physical exam shows her right foot to be red tender and swollen, some exudate noted between the toes.  No open wounds.  Neurovascular is intact  Uric acid elevated at 9.7, CBC with decreased H&H of 11.8/38.5, comprehensive metabolic panel is normal, urinalysis normal.   Explained the findings to the patient and his mother.  He will be placed on Keflex and colchicine.  Discussed this also with the group home manager.  They are coming to pick him up 30 minutes.  He was given Zosyn IV while here in the ED.    Kellar Campi. was evaluated in Emergency Department on 08/21/2018 for the symptoms described in the history of present illness. He was evaluated in the context of the global COVID-19 pandemic, which necessitated consideration that the patient might be at risk for infection with the SARS-CoV-2 virus that causes COVID-19. Institutional protocols and algorithms that pertain to the evaluation of patients at risk for COVID-19 are in a state of rapid change based on information released by regulatory bodies including the CDC and federal and state organizations. These policies and algorithms were followed during the patient's care in the ED.   As part of my medical decision making, I reviewed the following data within the [place] History obtained from family, Nursing notes reviewed and incorporated, Labs reviewed see above, Old chart reviewed, Radiograph reviewed x-ray of the right foot is negative, Notes from prior ED visits and [place] Controlled Substance Database  ____________________________________________   FINAL CLINICAL IMPRESSION(S) / ED DIAGNOSES  Final diagnoses:  Cellulitis of foot, right  Idiopathic gout, unspecified chronicity,  unspecified site      NEW MEDICATIONS STARTED DURING THIS VISIT:  Discharge Medication List as of 08/21/2018  3:07 PM       Note:  This document was prepared using Dragon voice recognition software and may include unintentional dictation errors.  Versie Starks, PA-C 08/21/18 1915    Nena Polio, MD 08/21/18 2256

## 2018-08-21 NOTE — ED Triage Notes (Signed)
Pt from group home [facility] in [place]. Here today for rt foot swelling x 2 weeks

## 2018-09-24 DIAGNOSIS — Z0489 Encounter for examination and observation for other specified reasons: Secondary | ICD-10-CM | POA: Diagnosis not present

## 2018-12-13 ENCOUNTER — Encounter: Payer: Self-pay | Admitting: Emergency Medicine

## 2018-12-13 ENCOUNTER — Emergency Department
Admission: EM | Admit: 2018-12-13 | Discharge: 2018-12-13 | Disposition: A | Discharge status: Home / Self Care | Payer: 59 | Attending: Emergency Medicine | Admitting: Emergency Medicine

## 2018-12-13 ENCOUNTER — Other Ambulatory Visit: Payer: Self-pay

## 2018-12-13 DIAGNOSIS — Z7984 Long term (current) use of oral hypoglycemic drugs: Secondary | ICD-10-CM | POA: Diagnosis not present

## 2018-12-13 DIAGNOSIS — Z87891 Personal history of nicotine dependence: Secondary | ICD-10-CM | POA: Insufficient documentation

## 2018-12-13 DIAGNOSIS — E119 Type 2 diabetes mellitus without complications: Secondary | ICD-10-CM | POA: Diagnosis not present

## 2018-12-13 DIAGNOSIS — E039 Hypothyroidism, unspecified: Secondary | ICD-10-CM | POA: Insufficient documentation

## 2018-12-13 DIAGNOSIS — R600 Localized edema: Secondary | ICD-10-CM | POA: Diagnosis present

## 2018-12-13 DIAGNOSIS — K047 Periapical abscess without sinus: Secondary | ICD-10-CM | POA: Insufficient documentation

## 2018-12-13 DIAGNOSIS — J45909 Unspecified asthma, uncomplicated: Secondary | ICD-10-CM | POA: Diagnosis not present

## 2018-12-13 DIAGNOSIS — R220 Localized swelling, mass and lump, head: Secondary | ICD-10-CM

## 2018-12-13 DIAGNOSIS — Z79899 Other long term (current) drug therapy: Secondary | ICD-10-CM | POA: Insufficient documentation

## 2018-12-13 DIAGNOSIS — J449 Chronic obstructive pulmonary disease, unspecified: Secondary | ICD-10-CM | POA: Insufficient documentation

## 2018-12-13 LAB — LACTIC ACID, PLASMA: Lactic Acid, Venous: 1.5 mmol/L (ref 0.5–1.9)

## 2018-12-13 LAB — COMPREHENSIVE METABOLIC PANEL
ALT: 20 U/L (ref 0–44)
AST: 15 U/L (ref 15–41)
Albumin: 4 g/dL (ref 3.5–5.0)
Alkaline Phosphatase: 52 U/L (ref 38–126)
Anion gap: 11 (ref 5–15)
BUN: 14 mg/dL (ref 6–20)
CO2: 27 mmol/L (ref 22–32)
Calcium: 8.8 mg/dL — ABNORMAL LOW (ref 8.9–10.3)
Chloride: 94 mmol/L — ABNORMAL LOW (ref 98–111)
Creatinine, Ser: 1.11 mg/dL (ref 0.61–1.24)
GFR calc Af Amer: >60 mL/min (ref >60)
GFR calc non Af Amer: >60 mL/min (ref >60)
Glucose, Bld: 196 mg/dL — ABNORMAL HIGH (ref 70–99)
Potassium: 4.1 mmol/L (ref 3.5–5.1)
Sodium: 132 mmol/L — ABNORMAL LOW (ref 135–145)
Total Bilirubin: 0.5 mg/dL (ref 0.3–1.2)
Total Protein: 9.2 g/dL — ABNORMAL HIGH (ref 6.5–8.1)

## 2018-12-13 LAB — URINALYSIS, COMPLETE (UACMP) WITH MICROSCOPIC
Bacteria, UA: NONE SEEN
Bilirubin Urine: NEGATIVE
Glucose, UA: NEGATIVE mg/dL
Hgb urine dipstick: NEGATIVE
Ketones, ur: NEGATIVE mg/dL
Nitrite: NEGATIVE
Protein, ur: 30 mg/dL — AB
Specific Gravity, Urine: 1.016 (ref 1.005–1.030)
pH: 5 (ref 5.0–8.0)

## 2018-12-13 LAB — CBC WITH DIFFERENTIAL/PLATELET
Abs Immature Granulocytes: 0.04 10*3/uL (ref 0.00–0.07)
Basophils Absolute: 0 10*3/uL (ref 0.0–0.1)
Basophils Relative: 0 %
Eosinophils Absolute: 0 10*3/uL (ref 0.0–0.5)
Eosinophils Relative: 0 %
HCT: 41.1 % (ref 39.0–52.0)
Hemoglobin: 12.8 g/dL — ABNORMAL LOW (ref 13.0–17.0)
Immature Granulocytes: 1 %
Lymphocytes Relative: 18 %
Lymphs Abs: 1.5 10*3/uL (ref 0.7–4.0)
MCH: 26.3 pg (ref 26.0–34.0)
MCHC: 31.1 g/dL (ref 30.0–36.0)
MCV: 84.6 fL (ref 80.0–100.0)
Monocytes Absolute: 0.9 10*3/uL (ref 0.1–1.0)
Monocytes Relative: 11 %
Neutro Abs: 5.9 10*3/uL (ref 1.7–7.7)
Neutrophils Relative %: 70 %
Platelets: 199 10*3/uL (ref 150–400)
RBC: 4.86 MIL/uL (ref 4.22–5.81)
RDW: 14.6 % (ref 11.5–15.5)
WBC: 8.4 10*3/uL (ref 4.0–10.5)
nRBC: 0 % (ref 0.0–0.2)

## 2018-12-13 MED ORDER — AMOXICILLIN-POT CLAVULANATE 875-125 MG PO TABS: 1.0000 | ORAL_TABLET | Freq: Two times a day (BID) | ORAL | 0 refills | Status: AC

## 2018-12-13 MED ORDER — SODIUM CHLORIDE 0.9% FLUSH: 3.0000 mL | Freq: Once | INTRAVENOUS | Status: DC

## 2018-12-13 MED ORDER — AMOXICILLIN-POT CLAVULANATE 875-125 MG PO TABS
1.0000 | ORAL_TABLET | Freq: Once | ORAL | Status: AC
  Administered 2018-12-13: 19:00:00 1 via ORAL
  Filled 2018-12-13: qty 1

## 2018-12-13 MED ORDER — LIDOCAINE-EPINEPHRINE 2 %-1:100000 IJ SOLN
1.7000 mL | Freq: Once | INTRAMUSCULAR | Status: AC
  Administered 2018-12-13: 1.7 mL
  Filled 2018-12-13: qty 1.7

## 2018-12-13 NOTE — ED Notes (Signed)
This RN spoke with Rosemarie Ax, who said she would be coming to get the pt in 15 minutes.

## 2018-12-13 NOTE — ED Provider Notes (Signed)
[facility] Emergency Department Provider Note ____________________________________________  Time seen: 1913  I have reviewed the triage vital signs and the nursing notes.  HISTORY  Chief Complaint  Facial Swelling  HPI John [name]. is a 43 y.o. male presents himself to the ED for evaluation of right-sided facial pain and swelling.  Patient describes he awoke this morning with swelling to the right side of his face approaching his infraorbital region.  He admits to swelling also in the mouth at the upper gumline of the first molar.  Patient denies any spontaneous purulent drainage.  He has been able to control oral secretions, and has had no difficulty eating, drinking, or swallowing.  He denies any interim fevers.   Past Medical History:  Diagnosis Date  . Anemia   . Asthma   . Diabetes mellitus   . Morbid obesity ([place])   . Paranoid schizophrenia ([place])   . Personality disorder ([place])   . Sleep apnea   . Thyroid disease     Patient Active Problem List   Diagnosis Date Noted  . Encephalopathy acute   . Somnolence 11/01/2016  . AKI (acute kidney injury) ([place]) 11/01/2016  . Sepsis ([place]) 04/27/2016  . Respiratory failure ([place]) 12/20/2015  . Cardiomyopathy ([place]) 11/28/2015  . Tobacco abuse 11/28/2015  . Acute respiratory failure with hypercapnia ([place])   . OSA (obstructive sleep apnea)   . Essential hypertension 11/25/2015  . Hypothyroidism 11/25/2015  . Paranoid schizophrenia, chronic condition ([place]) 11/25/2015  . Acute respiratory failure ([place])   . Acute exacerbation of COPD with asthma ([place])   . Dyslipidemia associated with type 2 diabetes mellitus ([place])   . Diabetes mellitus due to underlying condition without complication, without long-term current use of insulin ([place])   . Morbid obesity ([place]) 09/29/2008    Past Surgical History:  Procedure Laterality Date  . FRACTURE SURGERY      Prior to Admission medications   Medication Sig Start Date  End Date Taking? Authorizing Provider  amoxicillin-clavulanate (AUGMENTIN) 875-125 MG tablet Take 1 tablet by mouth 2 (two) times daily for 10 days. 12/13/18 12/23/18  Winston Sobczyk, Dannielle Karvonen, PA-C  asenapine (SAPHRIS) 5 MG SUBL 24 hr tablet Place 1 tablet (5 mg total) 2 (two) times daily under the tongue. Patient taking differently: Place 10 mg under the tongue 2 (two) times daily.  11/13/16   Bettey Costa, MD  atenolol (TENORMIN) 50 MG tablet Take 1 tablet (50 mg total) daily by mouth. 11/14/16   Bettey Costa, MD  atorvastatin (LIPITOR) 10 MG tablet Take 10 mg by mouth daily.    [provider]  cloZAPine (CLOZARIL) 100 MG tablet Take 100 mg by mouth daily.    [provider]  colchicine 0.6 MG tablet Take 1 tablet (0.6 mg total) by mouth daily. 08/21/18 08/21/19  Caryn Section Linden Dolin, PA-C  divalproex (DEPAKOTE ER) 500 MG 24 hr tablet Take 2,000 mg by mouth at bedtime.     [provider]  fluticasone furoate-vilanterol (BREO ELLIPTA) 100-25 MCG/INH AEPB Inhale 1 puff into the lungs daily.    [provider]  furosemide (LASIX) 20 MG tablet Take 3 tablets (60 mg total) by mouth daily. 04/28/16   Hillary Bow, MD  glipiZIDE (GLUCOTROL) 5 MG tablet Take 0.5 tablets (2.5 mg total) daily before breakfast by mouth. 11/13/16   Bettey Costa, MD  haloperidol (HALDOL) 5 MG tablet Take 5 mg by mouth 2 (two) times daily.     [provider]  ibuprofen (ADVIL,MOTRIN) 600 MG tablet Take 1 tablet (600 mg total) by mouth every 6 (six) hours as needed (foot pain). 01/04/17   Schaevitz, Randall An, MD  latanoprost (XALATAN) 0.005 % ophthalmic solution Place 1 drop into both eyes at bedtime.    [provider]  levothyroxine (SYNTHROID, LEVOTHROID) 75 MCG tablet Take 75 mcg by mouth daily.    [provider]  loratadine (CLARITIN) 10 MG tablet Take 10 mg by mouth daily.    [provider]  losartan (COZAAR) 50 MG tablet Take 50 mg by mouth daily.     [provider]  metFORMIN (GLUCOPHAGE) 500 MG tablet Take 1 tablet (500 mg total) by mouth 2 (two) times daily with a meal. 07/07/17 08/06/17  Arta Silence, MD  omeprazole (PRILOSEC) 20 MG capsule Take 20 mg by mouth daily.    [provider]  oxybutynin (DITROPAN) 5 MG tablet Take 5 mg by mouth daily.    [provider]  polyethylene glycol (MIRALAX / GLYCOLAX) packet Take 17 g by mouth daily. Mix with water or juice as listed on the label. 07/28/17   Hinda Kehr, MD  triamcinolone cream (KENALOG) 0.1 % Apply 1 application topically 2 (two) times daily as needed.     [provider]    Allergies Patient has no known allergies.  Family History  Problem Relation Age of Onset  . Glaucoma Mother     Social History Social History   Tobacco Use  . Smoking status: Former Research scientist (life sciences)  . Smokeless tobacco: Never Used  Substance Use Topics  . Alcohol use: Yes    Comment: only once in a while  . Drug use: No    Review of Systems  Constitutional: Negative for fever. Eyes: Negative for visual changes. ENT: Negative for sore throat.  Dental pain and facial swelling as above. Cardiovascular: Negative for chest pain. Respiratory: Negative for shortness of breath. Gastrointestinal: Negative for abdominal pain, vomiting and diarrhea. Genitourinary: Negative for dysuria. Musculoskeletal: Negative for back pain. Skin: Negative for rash. Neurological: Negative for headaches, focal weakness or numbness. ____________________________________________  PHYSICAL EXAM:  VITAL SIGNS: ED Triage Vitals  Enc Vitals Group     BP 12/13/18 1723 122/64     Pulse Rate 12/13/18 1723 (!) 101     Resp 12/13/18 1723 18     Temp 12/13/18 1723 99.1 F (37.3 C)     Temp Source 12/13/18 1723 Oral     SpO2 --      Weight 12/13/18 1724 (!) 312 lb (141.5 kg)     Height 12/13/18 1724 6' (1.829 m)     Head Circumference --      Peak Flow --      Pain Score 12/13/18 1723 8      Pain Loc --      Pain Edu? --      Excl. in [place]? --     Constitutional: Alert and oriented. Well appearing and in no distress. Head: Normocephalic and atraumatic, except for subtle swelling to the right side of the face from the infraorbital region to the right cheek.  There is blunting of the right nasolabial fold. Eyes: Conjunctivae are normal. PERRL. Normal extraocular movements Ears: Canals clear. TMs intact bilaterally. Nose: No congestion/rhinorrhea/epistaxis. Mouth/Throat: Mucous membranes are moist.  Uvula is midline and tonsils are flat.  No oropharyngeal lesions are appreciated.  Patient does have some focal bulging of the buccal mucosa at the lingual border of the first upper molar on  the right.  There is also fluctuance in the same region.  No spontaneous drainage is appreciated. Neck: Supple. No thyromegaly. Hematological/Lymphatic/Immunological: No cervical lymphadenopathy. Cardiovascular: Normal rate, regular rhythm. Normal distal pulses. Respiratory: Normal respiratory effort. No wheezes/rales/rhonchi. Musculoskeletal: Nontender with normal range of motion in all extremities.  Neurologic: Normal speech and language. No gross focal neurologic deficits are appreciated. Skin:  Skin is warm, dry and intact. No rash noted. Psychiatric: Mood and affect are normal. Patient exhibits appropriate insight and judgment. ____________________________________________   LABS (pertinent positives/negatives) Labs Reviewed  COMPREHENSIVE METABOLIC PANEL - Abnormal; Notable for the following components:      Result Value   Sodium 132 (*)    Chloride 94 (*)    Glucose, Bld 196 (*)    Calcium 8.8 (*)    Total Protein 9.2 (*)    All other components within normal limits  CBC WITH DIFFERENTIAL/PLATELET - Abnormal; Notable for the following components:   Hemoglobin 12.8 (*)    All other components within normal limits  URINALYSIS, COMPLETE (UACMP) WITH MICROSCOPIC - Abnormal; Notable for  the following components:   Color, Urine YELLOW (*)    APPearance CLEAR (*)    Protein, ur 30 (*)    Leukocytes,Ua SMALL (*)    All other components within normal limits  LACTIC ACID, PLASMA  ____________________________________________  PROCEDURES  Augmentin 875-125 mg p.o. Marland Kitchen.Incision and Drainage  Date/Time: 12/13/2018 8:04 PM Performed by: Melvenia Needles, PA-C Authorized by: Melvenia Needles, PA-C   Consent:    Consent obtained:  Verbal   Consent given by:  Patient   Risks discussed:  Bleeding, pain and incomplete drainage Location:    Type:  Abscess   Location:  Mouth   Mouth location:  Alveolar process Anesthesia (see MAR for exact dosages):    Anesthesia method:  Local infiltration   Local anesthetic:  Lidocaine 2% WITH epi Procedure type:    Complexity:  Simple Procedure details:    Needle aspiration: no     Incision types:  Stab incision   Incision depth:  Subcutaneous   Scalpel blade:  11   Drainage:  Purulent and bloody   Drainage amount:  Moderate   Packing materials:  None Post-procedure details:    Patient tolerance of procedure:  Tolerated well, no immediate complications  ____________________________________________  INITIAL IMPRESSION / ASSESSMENT AND PLAN / ED COURSE  Patient with ED evaluation of an acute dental abscess with facial swelling on the right.  He presents with a focal alveolar process abscess.  The presentation is amenable to local I&D procedure and the patient agrees to proceed.  Successful I&D procedures performed of a local dental abscess.  Patient is started on Augmentin prescription, and is discharged to follow-up with a dental provider.  Wound care instructions are provided.  He verbalized understanding and is discharged to the care of his caregiver.  Mar[place]o Rohal. was evaluated in Emergency Department on 12/13/2018 for the symptoms described in the history of present illness. He was evaluated in the context of  the global COVID-19 pandemic, which necessitated consideration that the patient might be at risk for infection with the SARS-CoV-2 virus that causes COVID-19. Institutional protocols and algorithms that pertain to the evaluation of patients at risk for COVID-19 are in a state of rapid change based on information released by regulatory bodies including the CDC and federal and state organizations. These policies and algorithms were followed during the patient's care in the ED. ____________________________________________  FINAL CLINICAL IMPRESSION(S) / ED DIAGNOSES  Final diagnoses:  Dental abscess  Facial swelling      Dameir Gentzler, Dannielle Karvonen, PA-C 12/13/18 2036    Vanessa [place]mire, MD 12/15/18 1534

## 2018-12-13 NOTE — ED Notes (Signed)
This Rn spoke with Rosemarie Ax at [phone]. pt's POA, this Rn provided a pt status update.

## 2018-12-13 NOTE — ED Triage Notes (Signed)
Pt via pov from group home brought by caretaker, [facility]. Per FYI notes, pt is his own guardian. Pt states he awakened this morning with facial swelling from his upper jaw up to his right eye. Pt denies any difficulty with breathing or swallowing. Pt alert & oriented with NAD noted.

## 2018-12-13 NOTE — Discharge Instructions (Signed)
You have dental abscess. The abscess was numbed and drained in the ED. Continue to take the prescription antibiotic until all pills are gone. Rinse 3-4 times a day with warm-salty water. See your dentist for ongoing symptoms. Brush daily. Return to the ED if needed.

## 2018-12-13 NOTE — ED Notes (Signed)
Pt to ED via POV c/o facial swelling on the right side of his face. Pt states that he woke up and his face was swollen. Pt is also c/o dental pain on the same side. Pt is in NAD.

## 2019-04-07 ENCOUNTER — Other Ambulatory Visit: Payer: Self-pay

## 2019-04-07 ENCOUNTER — Emergency Department
Admission: EM | Admit: 2019-04-07 | Discharge: 2019-04-07 | Disposition: A | Discharge status: Home / Self Care | Payer: 59 | Attending: Student in an Organized Health Care Education/Training Program | Admitting: Student in an Organized Health Care Education/Training Program

## 2019-04-07 DIAGNOSIS — R52 Pain, unspecified: Secondary | ICD-10-CM

## 2019-04-07 DIAGNOSIS — E119 Type 2 diabetes mellitus without complications: Secondary | ICD-10-CM | POA: Insufficient documentation

## 2019-04-07 DIAGNOSIS — J45909 Unspecified asthma, uncomplicated: Secondary | ICD-10-CM | POA: Insufficient documentation

## 2019-04-07 DIAGNOSIS — Z87891 Personal history of nicotine dependence: Secondary | ICD-10-CM | POA: Diagnosis not present

## 2019-04-07 DIAGNOSIS — Z7984 Long term (current) use of oral hypoglycemic drugs: Secondary | ICD-10-CM | POA: Insufficient documentation

## 2019-04-07 DIAGNOSIS — T8089XA Other complications following infusion, transfusion and therapeutic injection, initial encounter: Secondary | ICD-10-CM | POA: Insufficient documentation

## 2019-04-07 DIAGNOSIS — Z79899 Other long term (current) drug therapy: Secondary | ICD-10-CM | POA: Diagnosis not present

## 2019-04-07 DIAGNOSIS — M25511 Pain in right shoulder: Secondary | ICD-10-CM | POA: Diagnosis present

## 2019-04-07 MED ORDER — MELOXICAM 15 MG PO TABS: 15.0000 mg | ORAL_TABLET | Freq: Every day | ORAL | 0 refills | Status: DC

## 2019-04-07 NOTE — ED Triage Notes (Signed)
Pt to ED via [facility] from group home [facility]" for chief complaint of right arm x1 week. Pt states it has been hard to lift his arm and he has been having to eat with his left hand.  Pt in NAD at this time

## 2019-04-07 NOTE — ED Notes (Signed)
Legal guardian/mother Norlene Campbell updated

## 2019-04-07 NOTE — ED Provider Notes (Signed)
[facility] Emergency Department Provider Note  ____________________________________________  Time seen: Approximately 4:23 PM  I have reviewed the triage vital signs and the nursing notes.   HISTORY  Chief Complaint Arm Pain    HPI John [name]. is a 44 y.o. male who presents the emergency department via EMS from his group home for a complaint of right arm/shoulder pain.  According to the patient, he received his first Covid vaccine.  After receiving his vaccine he has had right shoulder pain.  Patient states that it hurts when he moves his arm so he is trying not to move it.  Patient is able to move the arm fully.  When asked where his arm/shoulder hurts he points to his right shoulder.  When asked where he received his Covid vaccine, patient points to the same location.  Patient denies any other complaints at this time.  Nursing staff talked with the patient's legal guardian, his mother and his mother states that the group was concerned that he may have a stroke given his arm pain.         Past Medical History:  Diagnosis Date  . Anemia   . Asthma   . Diabetes mellitus   . Morbid obesity ([place])   . Paranoid schizophrenia ([place])   . Personality disorder ([place])   . Sleep apnea   . Thyroid disease     Patient Active Problem List   Diagnosis Date Noted  . Encephalopathy acute   . Somnolence 11/01/2016  . AKI (acute kidney injury) ([place]) 11/01/2016  . Sepsis ([place]) 04/27/2016  . Respiratory failure ([place]) 12/20/2015  . Cardiomyopathy ([place]) 11/28/2015  . Tobacco abuse 11/28/2015  . Acute respiratory failure with hypercapnia ([place])   . OSA (obstructive sleep apnea)   . Essential hypertension 11/25/2015  . Hypothyroidism 11/25/2015  . Paranoid schizophrenia, chronic condition ([place]) 11/25/2015  . Acute respiratory failure ([place])   . Acute exacerbation of COPD with asthma ([place])   . Dyslipidemia associated with type 2 diabetes mellitus ([place])   . Diabetes  mellitus due to underlying condition without complication, without long-term current use of insulin ([place])   . Morbid obesity ([place]) 09/29/2008    Past Surgical History:  Procedure Laterality Date  . FRACTURE SURGERY      Prior to Admission medications   Medication Sig Start Date End Date Taking? Authorizing Provider  asenapine (SAPHRIS) 5 MG SUBL 24 hr tablet Place 1 tablet (5 mg total) 2 (two) times daily under the tongue. Patient taking differently: Place 10 mg under the tongue 2 (two) times daily.  11/13/16   Bettey Costa, MD  atenolol (TENORMIN) 50 MG tablet Take 1 tablet (50 mg total) daily by mouth. 11/14/16   Bettey Costa, MD  atorvastatin (LIPITOR) 10 MG tablet Take 10 mg by mouth daily.    [provider]  cloZAPine (CLOZARIL) 100 MG tablet Take 100 mg by mouth daily.    [provider]  colchicine 0.6 MG tablet Take 1 tablet (0.6 mg total) by mouth daily. 08/21/18 08/21/19  Caryn Section Linden Dolin, PA-C  divalproex (DEPAKOTE ER) 500 MG 24 hr tablet Take 2,000 mg by mouth at bedtime.     [provider]  fluticasone furoate-vilanterol (BREO ELLIPTA) 100-25 MCG/INH AEPB Inhale 1 puff into the lungs daily.    [provider]  furosemide (LASIX) 20 MG tablet Take 3 tablets (60 mg total) by mouth daily. 04/28/16   Hillary Bow, MD  glipiZIDE (GLUCOTROL) 5 MG tablet  Take 0.5 tablets (2.5 mg total) daily before breakfast by mouth. 11/13/16   Bettey Costa, MD  haloperidol (HALDOL) 5 MG tablet Take 5 mg by mouth 2 (two) times daily.     [provider]  ibuprofen (ADVIL,MOTRIN) 600 MG tablet Take 1 tablet (600 mg total) by mouth every 6 (six) hours as needed (foot pain). 01/04/17   Schaevitz, Randall An, MD  latanoprost (XALATAN) 0.005 % ophthalmic solution Place 1 drop into both eyes at bedtime.    [provider]  levothyroxine (SYNTHROID, LEVOTHROID) 75 MCG tablet Take 75 mcg by mouth daily.    [provider]  loratadine (CLARITIN) 10  MG tablet Take 10 mg by mouth daily.    [provider]  losartan (COZAAR) 50 MG tablet Take 50 mg by mouth daily.    [provider]  meloxicam (MOBIC) 15 MG tablet Take 1 tablet (15 mg total) by mouth daily. 04/07/19   Leesa Leifheit, Charline Bills, PA-C  metFORMIN (GLUCOPHAGE) 500 MG tablet Take 1 tablet (500 mg total) by mouth 2 (two) times daily with a meal. 07/07/17 08/06/17  Arta Silence, MD  omeprazole (PRILOSEC) 20 MG capsule Take 20 mg by mouth daily.    [provider]  oxybutynin (DITROPAN) 5 MG tablet Take 5 mg by mouth daily.    [provider]  polyethylene glycol (MIRALAX / GLYCOLAX) packet Take 17 g by mouth daily. Mix with water or juice as listed on the label. 07/28/17   Hinda Kehr, MD  triamcinolone cream (KENALOG) 0.1 % Apply 1 application topically 2 (two) times daily as needed.     [provider]    Allergies Patient has no known allergies.  Family History  Problem Relation Age of Onset  . Glaucoma Mother     Social History Social History   Tobacco Use  . Smoking status: Former Research scientist (life sciences)  . Smokeless tobacco: Never Used  Substance Use Topics  . Alcohol use: Yes    Comment: only once in a while  . Drug use: No     Review of Systems  Constitutional: No fever/chills Eyes: No visual changes. No discharge ENT: No upper respiratory complaints. Cardiovascular: no chest pain. Respiratory: no cough. No SOB. Gastrointestinal: No abdominal pain.  No nausea, no vomiting.   Musculoskeletal: Positive for right arm/shoulder pain Skin: Negative for rash, abrasions, lacerations, ecchymosis. Neurological: Negative for headaches, focal weakness or numbness. 10-point ROS otherwise negative.  ____________________________________________   PHYSICAL EXAM:  VITAL SIGNS: ED Triage Vitals  Enc Vitals Group     BP 04/07/19 1617 121/67     Pulse Rate 04/07/19 1617 79     Resp 04/07/19 1617 18     Temp 04/07/19 1617 98.1 F (36.7  C)     Temp Source 04/07/19 1617 Oral     SpO2 04/07/19 1617 96 %     Weight 04/07/19 1618 (!) 312 lb (141.5 kg)     Height 04/07/19 1618 6' (1.829 m)     Head Circumference --      Peak Flow --      Pain Score 04/07/19 1618 3     Pain Loc --      Pain Edu? --      Excl. in [place]? --      Constitutional: Alert and oriented. Well appearing and in no acute distress. Eyes: Conjunctivae are normal. PERRL. EOMI. Head: Atraumatic. ENT:      Ears:       Nose: No congestion/rhinnorhea.  Mouth/Throat: Mucous membranes are moist.  Neck: No stridor.  No cervical spine tenderness to palpation.  Cardiovascular: Normal rate, regular rhythm. Normal S1 and S2.  Good peripheral circulation. Respiratory: Normal respiratory effort without tachypnea or retractions. Lungs CTAB. Good air entry to the bases with no decreased or absent breath sounds. Musculoskeletal: Full range of motion to all extremities. No gross deformities appreciated.  Patient has good range of motion to the right upper extremity.  No erythema, edema, ecchymosis noted.  Patient points to the point specific location where he received his Covid vaccination.  Patient is tender to the spot.  This is over the lateral deltoid.  No other appreciable area of tenderness.  Palpation along the cervical spine, anterior and posterior shoulder, mid and distal humerus, right elbow, right forearm, wrist and hand is unremarkable.  Radial pulse intact.  Sensation intact and equal to unaffected extremity. Neurologic:  Normal speech and language. No gross focal neurologic deficits are appreciated.  Skin:  Skin is warm, dry and intact. No rash noted. Psychiatric: Mood and affect are normal. Speech and behavior are normal. Patient exhibits slightly limited insight and judgement.   ____________________________________________   LABS (all labs ordered are listed, but only abnormal results are displayed)  Labs Reviewed - No data to  display ____________________________________________  EKG   ____________________________________________  RADIOLOGY   No results found.  ____________________________________________    PROCEDURES  Procedure(s) performed:    Procedures    Medications - No data to display   ____________________________________________   INITIAL IMPRESSION / ASSESSMENT AND PLAN / ED COURSE  Pertinent labs & imaging results that were available during my care of the patient were reviewed by me and considered in my medical decision making (see chart for details).  Review of the [place] CSRS was performed in accordance of the [place] prior to dispensing any controlled drugs.           Patient's diagnosis is consistent with injection pain site.  Patient presented to emergency department complaining of right shoulder pain.  Patient states that he received his Covid vaccine 1 week ago.  Since then he has had lateral shoulder pain.  When asked where patient hurts, he points to a specific location right shoulder.  This is confirmed with point tenderness on exam.  When asked where patient receives his Covid vaccine he points to the same spot.  Exam was otherwise reassuring.  Patient denied any other complaints.  Patiently placed on meloxicam for symptom improvement.  Follow-up with primary care as needed.. Patient is given ED precautions to return to the ED for any worsening or new symptoms.     ____________________________________________  FINAL CLINICAL IMPRESSION(S) / ED DIAGNOSES  Final diagnoses:  Pain at injection site, initial encounter      NEW MEDICATIONS STARTED DURING THIS VISIT:  ED Discharge Orders         Ordered    meloxicam (MOBIC) 15 MG tablet  Daily     04/07/19 1636              This chart was dictated using voice recognition software/Dragon. Despite best efforts to proofread, errors can occur which can change the meaning. Any change was purely unintentional.     Darletta Moll, PA-C 04/07/19 1637    Merlyn Lot, MD 04/07/19 1747

## 2019-05-19 ENCOUNTER — Ambulatory Visit: Payer: 59 | Admitting: Internal Medicine

## 2019-05-21 ENCOUNTER — Encounter: Payer: Self-pay | Admitting: Internal Medicine

## 2019-05-21 ENCOUNTER — Ambulatory Visit (INDEPENDENT_AMBULATORY_CARE_PROVIDER_SITE_OTHER): Payer: 59 | Admitting: Internal Medicine

## 2019-05-21 ENCOUNTER — Other Ambulatory Visit: Payer: Self-pay

## 2019-05-21 VITALS — BP 111/75 | HR 81 | Wt 304.7 lb

## 2019-05-21 DIAGNOSIS — Z720 Tobacco use: Secondary | ICD-10-CM | POA: Diagnosis not present

## 2019-05-21 DIAGNOSIS — E139 Other specified diabetes mellitus without complications: Secondary | ICD-10-CM | POA: Diagnosis not present

## 2019-05-21 DIAGNOSIS — I10 Essential (primary) hypertension: Secondary | ICD-10-CM

## 2019-05-21 DIAGNOSIS — I420 Dilated cardiomyopathy: Secondary | ICD-10-CM | POA: Diagnosis not present

## 2019-05-21 DIAGNOSIS — G4733 Obstructive sleep apnea (adult) (pediatric): Secondary | ICD-10-CM

## 2019-05-21 LAB — GLUCOSE, POCT (MANUAL RESULT ENTRY): POC Glucose: 161 mg/dl — AB (ref 70–99)

## 2019-05-23 ENCOUNTER — Other Ambulatory Visit: Payer: Self-pay

## 2019-05-23 ENCOUNTER — Encounter: Payer: Self-pay | Admitting: Emergency Medicine

## 2019-05-23 DIAGNOSIS — E039 Hypothyroidism, unspecified: Secondary | ICD-10-CM | POA: Diagnosis not present

## 2019-05-23 DIAGNOSIS — Z79899 Other long term (current) drug therapy: Secondary | ICD-10-CM | POA: Insufficient documentation

## 2019-05-23 DIAGNOSIS — E1165 Type 2 diabetes mellitus with hyperglycemia: Secondary | ICD-10-CM | POA: Diagnosis not present

## 2019-05-23 DIAGNOSIS — Z7984 Long term (current) use of oral hypoglycemic drugs: Secondary | ICD-10-CM | POA: Diagnosis not present

## 2019-05-23 LAB — URINALYSIS, COMPLETE (UACMP) WITH MICROSCOPIC
Bacteria, UA: NONE SEEN
Bilirubin Urine: NEGATIVE
Glucose, UA: 150 mg/dL — AB
Hgb urine dipstick: NEGATIVE
Ketones, ur: 5 mg/dL — AB
Nitrite: NEGATIVE
Protein, ur: NEGATIVE mg/dL
Specific Gravity, Urine: 1.021 (ref 1.005–1.030)
pH: 5 (ref 5.0–8.0)

## 2019-05-23 LAB — CBC
HCT: 38.8 % — ABNORMAL LOW (ref 39.0–52.0)
Hemoglobin: 12.3 g/dL — ABNORMAL LOW (ref 13.0–17.0)
MCH: 26.8 pg (ref 26.0–34.0)
MCHC: 31.7 g/dL (ref 30.0–36.0)
MCV: 84.5 fL (ref 80.0–100.0)
Platelets: 157 10*3/uL (ref 150–400)
RBC: 4.59 MIL/uL (ref 4.22–5.81)
RDW: 14.3 % (ref 11.5–15.5)
WBC: 6.2 10*3/uL (ref 4.0–10.5)
nRBC: 0 % (ref 0.0–0.2)

## 2019-05-23 LAB — BASIC METABOLIC PANEL
Anion gap: 8 (ref 5–15)
BUN: 19 mg/dL (ref 6–20)
CO2: 27 mmol/L (ref 22–32)
Calcium: 8.6 mg/dL — ABNORMAL LOW (ref 8.9–10.3)
Chloride: 97 mmol/L — ABNORMAL LOW (ref 98–111)
Creatinine, Ser: 1.34 mg/dL — ABNORMAL HIGH (ref 0.61–1.24)
GFR calc Af Amer: >60 mL/min (ref >60)
GFR calc non Af Amer: >60 mL/min (ref >60)
Glucose, Bld: 313 mg/dL — ABNORMAL HIGH (ref 70–99)
Potassium: 4.6 mmol/L (ref 3.5–5.1)
Sodium: 132 mmol/L — ABNORMAL LOW (ref 135–145)

## 2019-05-23 LAB — GLUCOSE, CAPILLARY: Glucose-Capillary: 310 mg/dL — ABNORMAL HIGH (ref 70–99)

## 2019-05-23 NOTE — ED Notes (Signed)
First nurse note: Pt to the er via EMS for hyperglycemic at 323. Pt is a group home resident in [place] called [facility]. Pt is not symptomatic. Metformin 1000 mg at 2100.  Pt is not on insulin. A&O x 4.

## 2019-05-23 NOTE — ED Triage Notes (Signed)
Pt arrives via [facility] with hyperglycemia which pt had checked at group home and stated it was 364 at the group home. Pt reports taking all his oral medications today but denies taking insulin. Per Joseph Art, please call group home with any questions as she is off tonight [phone]. This RN attempted to call x1 at this time with no answer.

## 2019-05-24 ENCOUNTER — Emergency Department
Admission: EM | Admit: 2019-05-24 | Discharge: 2019-05-24 | Disposition: A | Discharge status: Home / Self Care | Payer: 59 | Attending: Emergency Medicine | Admitting: Emergency Medicine

## 2019-05-24 DIAGNOSIS — E1165 Type 2 diabetes mellitus with hyperglycemia: Secondary | ICD-10-CM | POA: Diagnosis not present

## 2019-05-24 DIAGNOSIS — R739 Hyperglycemia, unspecified: Secondary | ICD-10-CM

## 2019-05-24 LAB — GLUCOSE, CAPILLARY: Glucose-Capillary: 257 mg/dL — ABNORMAL HIGH (ref 70–99)

## 2019-05-24 MED ORDER — LACTATED RINGERS IV BOLUS
1000.0000 mL | Freq: Once | INTRAVENOUS | Status: AC
  Administered 2019-05-24: 1000 mL via INTRAVENOUS

## 2019-05-24 NOTE — ED Notes (Signed)
Contacted Arnell of group home and informed him patient ready for discharge.  States he will be here in about 30 minutes to pick up patient.

## 2019-05-24 NOTE — ED Notes (Signed)
Patient reports noticing that his blood glucose elevated.  Patient denies any other accompanying symptoms.  Patient resting quietly in stretcher, voicing no complaints.

## 2019-05-24 NOTE — ED Provider Notes (Signed)
[facility] Emergency Department Provider Note  ____________________________________________  Time seen: Approximately 1:50 AM  I have reviewed the triage vital signs and the nursing notes.   HISTORY  Chief Complaint Hyperglycemia   HPI John [name]. is a 44 y.o. male with a history of type 2 diabetes on Metformin, schizophrenia, anemia, asthma who presents from his group home for hyperglycemia.  Patient reports that he went to have dinner with his mother today.  He had a large fries, hamburger, and a large milkshake.  Upon arriving back to the group home his sugar was checked and found to be 364 and EMS was called.  Patient endorses compliance with his Metformin.  He is not on insulin.  He is completely asymptomatic and denies any signs or symptoms of infection including cough, chest pain, shortness of breath, abdominal pain, vomiting or diarrhea, fever or chills, dysuria or hematuria.   Past Medical History:  Diagnosis Date  . Anemia   . Asthma   . Diabetes mellitus   . Morbid obesity ([place])   . Paranoid schizophrenia ([place])   . Personality disorder ([place])   . Sleep apnea   . Thyroid disease     Patient Active Problem List   Diagnosis Date Noted  . Encephalopathy acute   . Somnolence 11/01/2016  . AKI (acute kidney injury) ([place]) 11/01/2016  . Sepsis ([place]) 04/27/2016  . Respiratory failure ([place]) 12/20/2015  . Cardiomyopathy ([place]) 11/28/2015  . Tobacco abuse 11/28/2015  . Acute respiratory failure with hypercapnia ([place])   . OSA (obstructive sleep apnea)   . Essential hypertension 11/25/2015  . Hypothyroidism 11/25/2015  . Paranoid schizophrenia, chronic condition ([place]) 11/25/2015  . Acute respiratory failure ([place])   . Acute exacerbation of COPD with asthma ([place])   . Dyslipidemia associated with type 2 diabetes mellitus ([place])   . Diabetes mellitus due to underlying condition without complication, without long-term current use of insulin ([place])     . Morbid obesity ([place]) 09/29/2008    Past Surgical History:  Procedure Laterality Date  . FRACTURE SURGERY      Prior to Admission medications   Medication Sig Start Date End Date Taking? Authorizing Provider  asenapine (SAPHRIS) 5 MG SUBL 24 hr tablet Place 1 tablet (5 mg total) 2 (two) times daily under the tongue. Patient taking differently: Place 10 mg under the tongue 2 (two) times daily.  11/13/16   Bettey Costa, MD  atenolol (TENORMIN) 50 MG tablet Take 1 tablet (50 mg total) daily by mouth. 11/14/16   Bettey Costa, MD  atorvastatin (LIPITOR) 10 MG tablet Take 10 mg by mouth daily.    [provider]  cloZAPine (CLOZARIL) 100 MG tablet Take 100 mg by mouth daily.    [provider]  colchicine 0.6 MG tablet Take 1 tablet (0.6 mg total) by mouth daily. 08/21/18 08/21/19  Caryn Section Linden Dolin, PA-C  divalproex (DEPAKOTE ER) 500 MG 24 hr tablet Take 2,000 mg by mouth at bedtime.     [provider]  furosemide (LASIX) 20 MG tablet Take 3 tablets (60 mg total) by mouth daily. 04/28/16   Hillary Bow, MD  glipiZIDE (GLUCOTROL) 5 MG tablet Take 0.5 tablets (2.5 mg total) daily before breakfast by mouth. 11/13/16   Bettey Costa, MD  haloperidol (HALDOL) 5 MG tablet Take 5 mg by mouth 2 (two) times daily.     [provider]  ibuprofen (ADVIL,MOTRIN) 600 MG tablet Take 1 tablet (600 mg total) by mouth  every 6 (six) hours as needed (foot pain). 01/04/17   Orbie Pyo, MD  levothyroxine (SYNTHROID, LEVOTHROID) 75 MCG tablet Take 75 mcg by mouth daily.    [provider]  loratadine (CLARITIN) 10 MG tablet Take 10 mg by mouth daily.    [provider]  losartan (COZAAR) 50 MG tablet Take 50 mg by mouth daily.    [provider]  meloxicam (MOBIC) 15 MG tablet Take 1 tablet (15 mg total) by mouth daily. 04/07/19   Cuthriell, Charline Bills, PA-C  metFORMIN (GLUCOPHAGE) 500 MG tablet Take 1 tablet (500 mg total) by mouth 2 (two) times  daily with a meal. 07/07/17 08/06/17  Arta Silence, MD  omeprazole (PRILOSEC) 20 MG capsule Take 20 mg by mouth daily.    [provider]  oxybutynin (DITROPAN) 5 MG tablet Take 5 mg by mouth daily.    [provider]    Allergies Patient has no known allergies.  Family History  Problem Relation Age of Onset  . Glaucoma Mother     Social History Social History   Tobacco Use  . Smoking status: Former Research scientist (life sciences)  . Smokeless tobacco: Never Used  Substance Use Topics  . Alcohol use: Yes    Comment: only once in a while  . Drug use: No    Review of Systems  Constitutional: Negative for fever. Eyes: Negative for visual changes. ENT: Negative for sore throat. Neck: No neck pain  Cardiovascular: Negative for chest pain. Respiratory: Negative for shortness of breath. Gastrointestinal: Negative for abdominal pain, vomiting or diarrhea. Genitourinary: Negative for dysuria. Musculoskeletal: Negative for back pain. Skin: Negative for rash. Neurological: Negative for headaches, weakness or numbness. Psych: No SI or HI  ____________________________________________   PHYSICAL EXAM:  VITAL SIGNS: ED Triage Vitals  Enc Vitals Group     BP 05/23/19 2214 103/69     Pulse Rate 05/23/19 2214 95     Resp 05/23/19 2214 18     Temp 05/23/19 2214 98.3 F (36.8 C)     Temp Source 05/23/19 2214 Oral     SpO2 --      Weight 05/23/19 2212 (!) 304 lb (137.9 kg)     Height 05/23/19 2212 6' (1.829 m)     Head Circumference --      Peak Flow --      Pain Score 05/23/19 2212 0     Pain Loc --      Pain Edu? --      Excl. in [place]? --     Constitutional: Alert and oriented. Well appearing and in no apparent distress. HEENT:      Head: Normocephalic and atraumatic.         Eyes: Conjunctivae are normal. Sclera is non-icteric.       Mouth/Throat: Mucous membranes are moist.       Neck: Supple with no signs of meningismus. Cardiovascular: Regular rate and rhythm. No  murmurs, gallops, or rubs.  Respiratory: Normal respiratory effort. Lungs are clear to auscultation bilaterally.  Gastrointestinal: Soft, non tender, and non distended Musculoskeletal: No edema, cyanosis, or erythema of extremities. Neurologic: Normal speech and language. Face is symmetric. Moving all extremities. No gross focal neurologic deficits are appreciated. Skin: Skin is warm, dry and intact. No rash noted. Psychiatric: Mood and affect are normal. Speech and behavior are normal.  ____________________________________________   LABS (all labs ordered are listed, but only abnormal results are displayed)  Labs Reviewed  GLUCOSE, CAPILLARY - Abnormal; Notable  for the following components:      Result Value   Glucose-Capillary 310 (*)    All other components within normal limits  BASIC METABOLIC PANEL - Abnormal; Notable for the following components:   Sodium 132 (*)    Chloride 97 (*)    Glucose, Bld 313 (*)    Creatinine, Ser 1.34 (*)    Calcium 8.6 (*)    All other components within normal limits  CBC - Abnormal; Notable for the following components:   Hemoglobin 12.3 (*)    HCT 38.8 (*)    All other components within normal limits  URINALYSIS, COMPLETE (UACMP) WITH MICROSCOPIC - Abnormal; Notable for the following components:   Color, Urine YELLOW (*)    APPearance HAZY (*)    Glucose, UA 150 (*)    Ketones, ur 5 (*)    Leukocytes,Ua TRACE (*)    All other components within normal limits  GLUCOSE, CAPILLARY - Abnormal; Notable for the following components:   Glucose-Capillary 257 (*)    All other components within normal limits  CBG MONITORING, ED  CBG MONITORING, ED   ____________________________________________  EKG  none  ____________________________________________  RADIOLOGY  none  ____________________________________________   PROCEDURES  Procedure(s) performed:yes .1-3 Lead EKG Interpretation Performed by: Rudene Re, MD Authorized by:  Rudene Re, MD     Interpretation: normal     ECG rate assessment: normal     Rhythm: sinus rhythm     Ectopy: none     Critical Care performed:  None ____________________________________________   INITIAL IMPRESSION / ASSESSMENT AND PLAN / ED COURSE  44 y.o. male with a history of type 2 diabetes on Metformin, schizophrenia, anemia, asthma who presents from his group home for hyperglycemia.  Patient is asymptomatic.  Presentation most likely due to dietary indiscretion as patient does report going out to eat with his mother this evening and having cheeseburger, large fries and large milkshake.  On exam is extremely well-appearing in no distress with normal vital signs.  Exam is nontoxic and nonfocal.  Labs showing hyperglycemia glucose of 313 but no evidence of DKA.  Patient reports that his blood glucose is usually below 200.  Will give a liter of LR and recheck.  Old medical records reviewed  _________________________ 3:31 AM on 05/24/2019 -----------------------------------------  Repeat CBG improving.  Patient remains asymptomatic.  Stable for discharge home.  Discussed the importance of keeping a healthy diet and close follow-up with PCP.  Discussed my standard return precautions.    _____________________________________________ Please note:  Patient was evaluated in Emergency Department today for the symptoms described in the history of present illness. Patient was evaluated in the context of the global COVID-19 pandemic, which necessitated consideration that the patient might be at risk for infection with the SARS-CoV-2 virus that causes COVID-19. Institutional protocols and algorithms that pertain to the evaluation of patients at risk for COVID-19 are in a state of rapid change based on information released by regulatory bodies including the CDC and federal and state organizations. These policies and algorithms were followed during the patient's care in the ED.  Some ED  evaluations and interventions may be delayed as a result of limited staffing during the pandemic.    Controlled Substance Database was reviewed by me. ____________________________________________   FINAL CLINICAL IMPRESSION(S) / ED DIAGNOSES   Final diagnoses:  Hyperglycemia      NEW MEDICATIONS STARTED DURING THIS VISIT:  ED Discharge Orders    None  Note:  This document was prepared using Dragon voice recognition software and may include unintentional dictation errors.    Alfred Levins, [place], MD 05/24/19 [phone]

## 2019-05-29 ENCOUNTER — Encounter: Payer: Self-pay | Admitting: Internal Medicine

## 2019-05-29 NOTE — Patient Instructions (Signed)

## 2019-05-29 NOTE — Progress Notes (Signed)
Established Patient Office Visit  Subjective:  Patient ID: John Klopf., male    DOB: [birth year]  Age: 44 y.o. MRN: [id number]  CC:  Chief Complaint  Patient presents with  . Diabetes    Caqregiver states that patients blood sugar has been fluctuating and that patinet eats unhealthy when he goes to his moms. patient woudl like a referral to lifestyle center to learn to eat healthier.     HPI  John [name]. presents for annual checkup.  He is known to have paranoid affective disorder.and  Essential hypertension  he continues to smoke.   He also has been unable to lose weight.  Past Medical History:  Diagnosis Date  . Anemia   . Asthma   . Diabetes mellitus   . Morbid obesity ([place])   . Paranoid schizophrenia ([place])   . Personality disorder ([place])   . Sleep apnea   . Thyroid disease     Past Surgical History:  Procedure Laterality Date  . FRACTURE SURGERY      Family History  Problem Relation Age of Onset  . Glaucoma Mother     Social History   Socioeconomic History  . Marital status: Single    Spouse name: Not on file  . Number of children: Not on file  . Years of education: Not on file  . Highest education level: Not on file  Occupational History  . Not on file  Tobacco Use  . Smoking status: Former Research scientist (life sciences)  . Smokeless tobacco: Never Used  Substance and Sexual Activity  . Alcohol use: Yes    Comment: only once in a while  . Drug use: No  . Sexual activity: Not on file  Other Topics Concern  . Not on file  Social History Narrative  . Not on file   Social Determinants of Health   Financial Resource Strain:   . Difficulty of Paying Living Expenses:   Food Insecurity:   . Worried About Charity fundraiser in the Last Year:   . Arboriculturist in the Last Year:   Transportation Needs:   . Film/video editor (Medical):   Marland Kitchen Lack of Transportation (Non-Medical):   Physical Activity:   . Days of Exercise per Week:   . Minutes of Exercise  per Session:   Stress:   . Feeling of Stress :   Social Connections:   . Frequency of Communication with Friends and Family:   . Frequency of Social Gatherings with Friends and Family:   . Attends Religious Services:   . Active Member of Clubs or Organizations:   . Attends Archivist Meetings:   Marland Kitchen Marital Status:   Intimate Partner Violence:   . Fear of Current or Ex-Partner:   . Emotionally Abused:   Marland Kitchen Physically Abused:   . Sexually Abused:      Current Outpatient Medications:  .  asenapine (SAPHRIS) 5 MG SUBL 24 hr tablet, Place 1 tablet (5 mg total) 2 (two) times daily under the tongue. (Patient taking differently: Place 10 mg under the tongue 2 (two) times daily. ), Disp: 60 tablet, Rfl: 0 .  atenolol (TENORMIN) 50 MG tablet, Take 1 tablet (50 mg total) daily by mouth., Disp: 30 tablet, Rfl: 0 .  atorvastatin (LIPITOR) 10 MG tablet, Take 10 mg by mouth daily., Disp: , Rfl:  .  cloZAPine (CLOZARIL) 100 MG tablet, Take 100 mg by mouth daily., Disp: , Rfl:  .  colchicine 0.6  MG tablet, Take 1 tablet (0.6 mg total) by mouth daily., Disp: 30 tablet, Rfl: 2 .  divalproex (DEPAKOTE ER) 500 MG 24 hr tablet, Take 2,000 mg by mouth at bedtime. , Disp: , Rfl:  .  furosemide (LASIX) 20 MG tablet, Take 3 tablets (60 mg total) by mouth daily., Disp: 30 tablet, Rfl:  .  glipiZIDE (GLUCOTROL) 5 MG tablet, Take 0.5 tablets (2.5 mg total) daily before breakfast by mouth., Disp: 30 tablet, Rfl: 0 .  haloperidol (HALDOL) 5 MG tablet, Take 5 mg by mouth 2 (two) times daily. , Disp: , Rfl:  .  ibuprofen (ADVIL,MOTRIN) 600 MG tablet, Take 1 tablet (600 mg total) by mouth every 6 (six) hours as needed (foot pain)., Disp: 12 tablet, Rfl: 0 .  levothyroxine (SYNTHROID, LEVOTHROID) 75 MCG tablet, Take 75 mcg by mouth daily., Disp: , Rfl:  .  loratadine (CLARITIN) 10 MG tablet, Take 10 mg by mouth daily., Disp: , Rfl:  .  losartan (COZAAR) 50 MG tablet, Take 50 mg by mouth daily., Disp: , Rfl:  .   meloxicam (MOBIC) 15 MG tablet, Take 1 tablet (15 mg total) by mouth daily., Disp: 30 tablet, Rfl: 0 .  omeprazole (PRILOSEC) 20 MG capsule, Take 20 mg by mouth daily., Disp: , Rfl:  .  oxybutynin (DITROPAN) 5 MG tablet, Take 5 mg by mouth daily., Disp: , Rfl:  .  metFORMIN (GLUCOPHAGE) 500 MG tablet, Take 1 tablet (500 mg total) by mouth 2 (two) times daily with a meal., Disp: 60 tablet, Rfl: 0   No Known Allergies    Review of Systems  Constitutional: Negative.   HENT: Negative for hearing loss.   Respiratory: Negative for chest tightness.   Cardiovascular: Negative for chest pain.  Genitourinary: Negative for dysuria.  Musculoskeletal: Negative for arthralgias.  Neurological: Negative for light-headedness.  Hematological: Negative for adenopathy.  Psychiatric/Behavioral: Negative for agitation, behavioral problems, confusion and dysphoric mood.      Objective:    Physical Exam patient is overweight is supple jugular venous pressure is not elevated there is no bruit.  On examination of the cardiovascular system apical impulse is palpable in the fifth intercostal space first heart sounds normal second heart sounds are normal chest no rales or rhonchi.  Abdomen is obese nontender there is no pedal edema.  BP 111/75   Pulse 81   Wt (!) 304 lb 11.2 oz (138.2 kg)   BMI 41.32 kg/m  Wt Readings from Last 3 Encounters:  05/23/19 (!) 304 lb (137.9 kg)  05/21/19 (!) 304 lb 11.2 oz (138.2 kg)  04/07/19 (!) 312 lb (141.5 kg)     Health Maintenance Due  Topic Date Due  . FOOT EXAM  Never done  . OPHTHALMOLOGY EXAM  Never done  . COVID-19 Vaccine (1) Never done  . TETANUS/TDAP  Never done  . HEMOGLOBIN A1C  05/01/2017    There are no preventive care reminders to display for this patient.  Lab Results  Component Value Date   TSH 1.842 11/01/2016   Lab Results  Component Value Date   WBC 6.2 05/23/2019   HGB 12.3 (L) 05/23/2019   HCT 38.8 (L) 05/23/2019   MCV 84.5  05/23/2019   PLT 157 05/23/2019   Lab Results  Component Value Date   NA 132 (L) 05/23/2019   K 4.6 05/23/2019   CO2 27 05/23/2019   GLUCOSE 313 (H) 05/23/2019   BUN 19 05/23/2019   CREATININE 1.34 (H) 05/23/2019   BILITOT  0.5 12/13/2018   ALKPHOS 52 12/13/2018   AST 15 12/13/2018   ALT 20 12/13/2018   PROT 9.2 (H) 12/13/2018   ALBUMIN 4.0 12/13/2018   CALCIUM 8.6 (L) 05/23/2019   ANIONGAP 8 05/23/2019   Lab Results  Component Value Date   CHOL 146 11/24/2013   Lab Results  Component Value Date   HDL 22 (L) 11/24/2013   Lab Results  Component Value Date   LDLCALC 94 11/24/2013   Lab Results  Component Value Date   TRIG 152 11/24/2013   No results found for: CHOLHDL Lab Results  Component Value Date   HGBA1C 5.7 (H) 11/01/2016      Assessment & Plan:   Problem List Items Addressed This Visit      Cardiovascular and Mediastinum   Essential hypertension (Chronic)    Low-salt diet.      Cardiomyopathy [facility])    Patient is stable at the present time.        Respiratory   OSA (obstructive sleep apnea)    She has been advised to lose weight this was discussed with the caretaker.   he was also advised to stop smoking        Other   Tobacco abuse    Patient was advised to quit smoking.       Other Visit Diagnoses    Diabetes mellitus of other type without complication, unspecified whether long term insulin use ([place])    -  Primary   Relevant Orders   Referral to Nutrition and Diabetes Services   POCT glucose (manual entry) (Completed)      No orders of the defined types were placed in this encounter.  1. Diabetes mellitus of other type without complication, unspecified whether long term insulin use ([place]) Blood sugar out of control discussed the diet with the patient and the caretaker. - Referral to Nutrition and Diabetes Services - POCT glucose (manual entry)  2. Essential hypertension Blood pressure stable at the present time patient is  taking his medication as directed.  3. Tobacco abuse Advised again to quit smoking.  4. Dilated cardiomyopathy ([place]) Table at the present time.  5. OSA (obstructive sleep apnea) Use CPAP machine as directed. Follow-up: Return in about 8 weeks (around 07/16/2019).    Cletis Athens, MD

## 2019-05-29 NOTE — Assessment & Plan Note (Signed)
She has been advised to lose weight this was discussed with the caretaker.   he was also advised to stop smoking

## 2019-05-29 NOTE — Assessment & Plan Note (Signed)
Low salt diet

## 2019-05-29 NOTE — Assessment & Plan Note (Signed)
Patient is stable at the present time.

## 2019-05-29 NOTE — Assessment & Plan Note (Signed)
Patient was advised to quit smoking 

## 2019-06-10 ENCOUNTER — Other Ambulatory Visit: Payer: Self-pay | Admitting: Internal Medicine

## 2019-06-11 ENCOUNTER — Telehealth: Payer: Self-pay | Admitting: Dietician

## 2019-06-11 ENCOUNTER — Ambulatory Visit: Payer: 59 | Admitting: Dietician

## 2019-06-11 NOTE — Telephone Encounter (Signed)
Called adult living facility where patient stays.  Left message with front desk that patient missed his 10:30 appt.  Front desk stated they will forward message along to [facility] who handles appts.

## 2019-06-18 ENCOUNTER — Other Ambulatory Visit: Payer: Self-pay

## 2019-06-18 ENCOUNTER — Encounter: Payer: Self-pay | Admitting: Dietician

## 2019-06-18 ENCOUNTER — Encounter: Payer: 59 | Attending: Internal Medicine | Admitting: Dietician

## 2019-06-18 VITALS — Ht 75.0 in | Wt 305.4 lb

## 2019-06-18 DIAGNOSIS — E139 Other specified diabetes mellitus without complications: Secondary | ICD-10-CM | POA: Insufficient documentation

## 2019-06-18 DIAGNOSIS — E089 Diabetes mellitus due to underlying condition without complications: Secondary | ICD-10-CM

## 2019-06-18 NOTE — Patient Instructions (Signed)
   Start drinking beverages without sugar  Switch to diet drinks (soda or tea)  Use sugar free sweetener and sugar free creamer in coffee  Decrease milkshake intake to once a month, choose smallest size   Continue drinking lots of water!   Start portion control your carbs  3-4 carb servings at meals   1-2 car servings at snacks   Use the dining out handout to make better choices at restaurants

## 2019-06-18 NOTE — Progress Notes (Signed)
Medical Nutrition Therapy: Visit start time: 6283  end time: 1630  Assessment:  Diagnosis: diabetes  Past medical history: HTN, hyperlipidemia Psychosocial issues/ stress concerns: none   Preferred learning method:  . Auditory . Visual . Hands-on  Current weight: 305.4 lbs  Height: 6'3" Medications, supplements: reconciled in medical record   Progress and evaluation:   Pt reports weighing 185 lbs when he was younger and diet regularly included chocolate bars, hard candy, and lots of sweets; pt reports being "surprised" that he developed diabetes    Pt states goal weight is 220 lbs  Pt was recently hospitalized for hyperglycemia, progress note states BG was 364 when EMS was called   Pt was visibly lethargic and dosed off several times during visit   Caretaker, accompanied pt for half of the visit, states that she prepares some of the meals along with another staff member   BG record indicates fasting BGs 138-213  Physical activity:  ADLs  Dietary Intake:  Usual eating pattern includes 3 meals and 1-2 snacks per day. Dining out frequency: 1+ meals per week.  Breakfast: eggs, grits, bacon and toast; pancakes/french toast and bacon with syrup; eggs and [place] sandwich Snack: chips, "chewy" granola bars  Lunch: fried shrimp, scallops, hush puppies, oysters, baked potato; [employer] fried chicken, large fries, and large shake  Snack: same as above Supper: taco [employer] taco supremes; [employer] boneless wings with blue cheese dressing, large fries, and garlic bread/spaghetti with prego and beef crumbles and broccoli  Snack: same as above Beverages: coffee with cream and sugar, root beer, mellow yellow, orange juice, water  Nutrition Care Education:  Basic nutrition: basic food groups, appropriate nutrient balance, appropriate meal and snack schedule, general nutrition guidelines    Weight control: identifying healthy weight, determining reasonable weight loss rate, importance of low  sugar and low fat choices, portion control strategies Advanced nutrition: dining out Diabetes:  appropriate meal and snack schedule, appropriate carb intake and balance  Nutritional Diagnosis:  [place]-3.3 Overweight/obesity As related to excess calorie intake and inadequate physical activity .  As evidenced by pt current BMI of 38.17. NI-5.8.2 Excessive carbohydrate intake As related to type 2 diabetes.  As evidenced by recent hospitalization for hyperglycemia and pt diet history.  Intervention:   Care taker indicated that the group home does not follow special diets for residents, pt is not following a carb controlled diet or a heart healthy diet   Care taker also indicated that pt helps with grocery shopping and he will buy sugary beverages or calorie-dense snack foods (chips, cookies, etc)  May be helpful to follow up with group home to make sure this was not a miscommunication   Focused on basics of carb counting and identifying carb-rich foods with patient  At follow up, would like care taker present for entire visit to relay diabetes guidelines to staff members that prepare meals since most of meals are prepared by staff  Recommend testing A1c soon  Education Materials given:  . General diet guidelines for Diabetes . Plate Planner with food lists . Snacking handout . Quick and Balanced Meals  . Dining out handout . Sip Smarter handout . Goals/ instructions   Learner/ who was taught:  . Patient  . Caregiver/ guardian- [name]    Level of understanding . Partial understanding; needs review/ practice  Demonstrated degree of understanding via:   Teach back Learning barriers: . Cognitive limitations . Comprehension of staff members  Willingness to learn/ readiness for change: . Non-acceptance, not  ready for change  Monitoring and Evaluation:  Dietary intake, exercise, A1c and BGs, lipid panel, and body weight      follow up: prn

## 2019-07-01 ENCOUNTER — Emergency Department
Admission: EM | Admit: 2019-07-01 | Discharge: 2019-07-02 | Disposition: A | Discharge status: Home / Self Care | Payer: 59 | Attending: Emergency Medicine | Admitting: Emergency Medicine

## 2019-07-01 ENCOUNTER — Encounter: Payer: Self-pay | Admitting: Emergency Medicine

## 2019-07-01 ENCOUNTER — Emergency Department: Payer: 59

## 2019-07-01 ENCOUNTER — Other Ambulatory Visit: Payer: Self-pay

## 2019-07-01 DIAGNOSIS — I10 Essential (primary) hypertension: Secondary | ICD-10-CM | POA: Insufficient documentation

## 2019-07-01 DIAGNOSIS — M109 Gout, unspecified: Secondary | ICD-10-CM

## 2019-07-01 DIAGNOSIS — M10071 Idiopathic gout, right ankle and foot: Secondary | ICD-10-CM | POA: Diagnosis not present

## 2019-07-01 DIAGNOSIS — J441 Chronic obstructive pulmonary disease with (acute) exacerbation: Secondary | ICD-10-CM | POA: Insufficient documentation

## 2019-07-01 DIAGNOSIS — Z7984 Long term (current) use of oral hypoglycemic drugs: Secondary | ICD-10-CM | POA: Diagnosis not present

## 2019-07-01 DIAGNOSIS — M545 Low back pain: Secondary | ICD-10-CM | POA: Diagnosis not present

## 2019-07-01 DIAGNOSIS — E039 Hypothyroidism, unspecified: Secondary | ICD-10-CM | POA: Diagnosis not present

## 2019-07-01 DIAGNOSIS — E119 Type 2 diabetes mellitus without complications: Secondary | ICD-10-CM | POA: Insufficient documentation

## 2019-07-01 DIAGNOSIS — Z79899 Other long term (current) drug therapy: Secondary | ICD-10-CM | POA: Insufficient documentation

## 2019-07-01 DIAGNOSIS — Z87891 Personal history of nicotine dependence: Secondary | ICD-10-CM | POA: Diagnosis not present

## 2019-07-01 DIAGNOSIS — R109 Unspecified abdominal pain: Secondary | ICD-10-CM | POA: Diagnosis present

## 2019-07-01 LAB — BASIC METABOLIC PANEL
Anion gap: 10 (ref 5–15)
BUN: 14 mg/dL (ref 6–20)
CO2: 31 mmol/L (ref 22–32)
Calcium: 9.2 mg/dL (ref 8.9–10.3)
Chloride: 95 mmol/L — ABNORMAL LOW (ref 98–111)
Creatinine, Ser: 1.08 mg/dL (ref 0.61–1.24)
GFR calc Af Amer: >60 mL/min (ref >60)
GFR calc non Af Amer: >60 mL/min (ref >60)
Glucose, Bld: 232 mg/dL — ABNORMAL HIGH (ref 70–99)
Potassium: 4.3 mmol/L (ref 3.5–5.1)
Sodium: 136 mmol/L (ref 135–145)

## 2019-07-01 LAB — URINALYSIS, COMPLETE (UACMP) WITH MICROSCOPIC
Bacteria, UA: NONE SEEN
Bilirubin Urine: NEGATIVE
Glucose, UA: NEGATIVE mg/dL
Hgb urine dipstick: NEGATIVE
Ketones, ur: NEGATIVE mg/dL
Leukocytes,Ua: NEGATIVE
Nitrite: NEGATIVE
Protein, ur: NEGATIVE mg/dL
Specific Gravity, Urine: 1.011 (ref 1.005–1.030)
pH: 5 (ref 5.0–8.0)

## 2019-07-01 LAB — CBC
HCT: 38.8 % — ABNORMAL LOW (ref 39.0–52.0)
Hemoglobin: 12.6 g/dL — ABNORMAL LOW (ref 13.0–17.0)
MCH: 27.1 pg (ref 26.0–34.0)
MCHC: 32.5 g/dL (ref 30.0–36.0)
MCV: 83.4 fL (ref 80.0–100.0)
Platelets: 207 10*3/uL (ref 150–400)
RBC: 4.65 MIL/uL (ref 4.22–5.81)
RDW: 14.1 % (ref 11.5–15.5)
WBC: 7.2 10*3/uL (ref 4.0–10.5)
nRBC: 0 % (ref 0.0–0.2)

## 2019-07-01 LAB — URIC ACID: Uric Acid, Serum: 9.6 mg/dL — ABNORMAL HIGH (ref 3.7–8.6)

## 2019-07-01 MED ORDER — COLCHICINE 0.6 MG PO TABS
0.6000 mg | ORAL_TABLET | Freq: Once | ORAL | Status: AC
  Administered 2019-07-01: 0.6 mg via ORAL
  Filled 2019-07-01: qty 1

## 2019-07-01 MED ORDER — CEPHALEXIN 500 MG PO CAPS
500.0000 mg | ORAL_CAPSULE | Freq: Two times a day (BID) | ORAL | 0 refills | Status: AC
Start: 2019-07-01 — End: 2019-07-11

## 2019-07-01 MED ORDER — INDOMETHACIN 50 MG PO CAPS
50.0000 mg | ORAL_CAPSULE | Freq: Three times a day (TID) | ORAL | 0 refills | Status: AC | PRN
Start: 2019-07-01 — End: 2019-07-06

## 2019-07-01 NOTE — ED Triage Notes (Signed)
First nurse note- here for right rib pain.  VSS with EMS except UTO blood pressure.  Pt reports to EMS has kidney pain from his roommate playing video games on his phone.

## 2019-07-01 NOTE — ED Triage Notes (Addendum)
Pt from [place] via [place]. Pt st right flank pain 6/10 radiates to back for 2 weeks. Pain progressively gotten worse today. Denies N/V/fever.  St noticing pain and swelling on right foot for 2 days, redness noted. Unable to bear weight on R/foot.  A/Ox4.  NAD noted at this time.

## 2019-07-01 NOTE — ED Provider Notes (Signed)
[facility] Emergency Department Provider Note  ____________________________________________   First MD Initiated Contact with Patient 07/01/19 2115     (approximate)  I have reviewed the triage vital signs and the nursing notes.   HISTORY  Chief Complaint Flank Pain   HPI John [name]. is a 44 y.o. male with below list of previous medical conditions including paranoid schizophrenia asthma and diabetes mellitus presents to the emergency department secondary to right flank/back pain with worsening over the past 2 days.  Patient also admits to swelling and redness with associate discomfort of the right foot x2 days.  Patient denies any fever no nausea or vomiting.  Patient denies any urinary symptoms including no hematuria dysuria.       Past Medical History:  Diagnosis Date  . Anemia   . Asthma   . Diabetes mellitus   . Morbid obesity ([place])   . Paranoid schizophrenia ([place])   . Personality disorder ([place])   . Sleep apnea   . Thyroid disease     Patient Active Problem List   Diagnosis Date Noted  . Encephalopathy acute   . Somnolence 11/01/2016  . AKI (acute kidney injury) ([place]) 11/01/2016  . Sepsis ([place]) 04/27/2016  . Respiratory failure ([place]) 12/20/2015  . Cardiomyopathy ([place]) 11/28/2015  . Tobacco abuse 11/28/2015  . Acute respiratory failure with hypercapnia ([place])   . OSA (obstructive sleep apnea)   . Essential hypertension 11/25/2015  . Hypothyroidism 11/25/2015  . Paranoid schizophrenia, chronic condition ([place]) 11/25/2015  . Acute respiratory failure ([place])   . Acute exacerbation of COPD with asthma ([place])   . Dyslipidemia associated with type 2 diabetes mellitus ([place])   . Diabetes mellitus due to underlying condition without complication, without long-term current use of insulin ([place])   . Morbid obesity ([place]) 09/29/2008    Past Surgical History:  Procedure Laterality Date  . FRACTURE SURGERY      Prior to Admission medications    Medication Sig Start Date End Date Taking? Authorizing Provider  acetaminophen (TYLENOL) 500 MG tablet Take by mouth.    [provider]  albuterol (PROVENTIL) (2.5 MG/3ML) 0.083% nebulizer solution Inhale into the lungs.    [provider]  asenapine (SAPHRIS) 5 MG SUBL 24 hr tablet Place 1 tablet (5 mg total) 2 (two) times daily under the tongue. Patient taking differently: Place 10 mg under the tongue 2 (two) times daily.  11/13/16   Bettey Costa, MD  atenolol (TENORMIN) 50 MG tablet Take 1 tablet (50 mg total) daily by mouth. Patient taking differently: Take 100 mg by mouth daily.  11/14/16   Bettey Costa, MD  atorvastatin (LIPITOR) 10 MG tablet Take 10 mg by mouth daily.    [provider]  cephALEXin (KEFLEX) 500 MG capsule Take 1 capsule (500 mg total) by mouth 2 (two) times daily for 10 days. 07/01/19 07/11/19  Gregor Hams, MD  cloZAPine (CLOZARIL) 100 MG tablet Take 100 mg by mouth daily.    [provider]  colchicine 0.6 MG tablet Take 1 tablet (0.6 mg total) by mouth daily. 08/21/18 08/21/19  Caryn Section Linden Dolin, PA-C  divalproex (DEPAKOTE ER) 500 MG 24 hr tablet Take 2,000 mg by mouth at bedtime.     [provider]  furosemide (LASIX) 20 MG tablet TAKE 3 TABLETS (60MG ) BY MOUTH ONCE DAILY 06/11/19   Cletis Athens, MD  glipiZIDE (GLUCOTROL) 5 MG tablet Take 0.5 tablets (2.5 mg total) daily before breakfast by mouth. 11/13/16  Bettey Costa, MD  haloperidol (HALDOL) 5 MG tablet Take 5 mg by mouth 2 (two) times daily.     [provider]  ibuprofen (ADVIL,MOTRIN) 600 MG tablet Take 1 tablet (600 mg total) by mouth every 6 (six) hours as needed (foot pain). 01/04/17   Schaevitz, Randall An, MD  indomethacin (INDOCIN) 50 MG capsule Take 1 capsule (50 mg total) by mouth 3 (three) times daily as needed for up to 5 days. 07/01/19 07/06/19  Gregor Hams, MD  JANUVIA 100 MG tablet TAKE 1 TABLET BY MOUTH ONCE DAILY 06/11/19   Cletis Athens, MD    levothyroxine (SYNTHROID, LEVOTHROID) 75 MCG tablet Take 75 mcg by mouth daily.    [provider]  loratadine (CLARITIN) 10 MG tablet Take 10 mg by mouth daily.    [provider]  losartan (COZAAR) 50 MG tablet Take 50 mg by mouth daily.    [provider]  meloxicam (MOBIC) 15 MG tablet Take 1 tablet (15 mg total) by mouth daily. 04/07/19   Cuthriell, Charline Bills, PA-C  metFORMIN (GLUCOPHAGE) 1000 MG tablet Take 1,000 mg by mouth daily. 06/05/19   [provider]  metFORMIN (GLUCOPHAGE) 500 MG tablet Take 1 tablet (500 mg total) by mouth 2 (two) times daily with a meal. 07/07/17 08/06/17  Arta Silence, MD  omeprazole (PRILOSEC) 20 MG capsule Take 20 mg by mouth daily.    [provider]  oxybutynin (DITROPAN) 5 MG tablet Take 5 mg by mouth daily.    [provider]    Allergies Patient has no known allergies.  Family History  Problem Relation Age of Onset  . Glaucoma Mother     Social History Social History   Tobacco Use  . Smoking status: Former Research scientist (life sciences)  . Smokeless tobacco: Never Used  Vaping Use  . Vaping Use: Never used  Substance Use Topics  . Alcohol use: Not Currently    Comment: only once in a while  . Drug use: No    Review of Systems Constitutional: No fever/chills Eyes: No visual changes. ENT: No sore throat. Cardiovascular: Denies chest pain. Respiratory: Denies shortness of breath. Gastrointestinal: No abdominal pain.  No nausea, no vomiting.  No diarrhea.  No constipation. Genitourinary: Negative for dysuria. Musculoskeletal: Negative for neck pain.  Negative for back pain. Integumentary: Positive for right foot redness swelling and tenderness Neurological: Negative for headaches, focal weakness or numbness.   ____________________________________________   PHYSICAL EXAM:  VITAL SIGNS: ED Triage Vitals  Enc Vitals Group     BP 07/01/19 1733 102/69     Pulse Rate 07/01/19 1733 95     Resp  07/01/19 1733 19     Temp 07/01/19 1733 97.9 F (36.6 C)     Temp Source 07/01/19 1733 Oral     SpO2 07/01/19 1733 98 %     Weight 07/01/19 1735 (!) 138.3 kg (305 lb)     Height 07/01/19 1735 1.905 m (6\' 3" )     Head Circumference --      Peak Flow --      Pain Score 07/01/19 1734 8     Pain Loc --      Pain Edu? --      Excl. in [place]? --     Constitutional: Alert and oriented.  Eyes: Conjunctivae are normal.  Head: Atraumatic. Mouth/Throat: Patient is wearing a mask. Neck: No stridor.  No meningeal signs.   Cardiovascular: Normal rate, regular rhythm. Good peripheral circulation. Grossly normal  heart sounds. Respiratory: Normal respiratory effort.  No retractions. Gastrointestinal: Soft and nontender. No distention.  Musculoskeletal: Blanching erythema warm to touch lateral aspect of the right foot Neurologic:  Normal speech and language. No gross focal neurologic deficits are appreciated.  Skin: Blanching erythema lateral aspect of the right foot warm to touch. Psychiatric: Mood and affect are normal. Speech and behavior are normal.  ____________________________________________   LABS (all labs ordered are listed, but only abnormal results are displayed)  Labs Reviewed  URINALYSIS, COMPLETE (UACMP) WITH MICROSCOPIC - Abnormal; Notable for the following components:      Result Value   Color, Urine YELLOW (*)    APPearance CLEAR (*)    All other components within normal limits  BASIC METABOLIC PANEL - Abnormal; Notable for the following components:   Chloride 95 (*)    Glucose, Bld 232 (*)    All other components within normal limits  CBC - Abnormal; Notable for the following components:   Hemoglobin 12.6 (*)    HCT 38.8 (*)    All other components within normal limits  URIC ACID - Abnormal; Notable for the following components:   Uric Acid, Serum 9.6 (*)    All other components within normal limits     RADIOLOGY I, [place], personally viewed and evaluated  these images (plain radiographs) as part of my medical decision making, as well as reviewing the written report by the radiologist.  ED MD interpretation: No acute abdominal pelvic abnormality noted on CT per radiologist.  Official radiology report(s): CT Renal Stone Study  Result Date: 07/01/2019 CLINICAL DATA:  Flank pain.  Right-sided flank pain EXAM: CT ABDOMEN AND PELVIS WITHOUT CONTRAST TECHNIQUE: Multidetector CT imaging of the abdomen and pelvis was performed following the standard protocol without IV contrast. COMPARISON:  July 28, 2017. FINDINGS: Lower chest: There are trace right-sided pleural effusions, right greater than left.The heart size is normal. Hepatobiliary: There is decreased hepatic attenuation suggestive of hepatic steatosis. Normal gallbladder.There is no biliary ductal dilation. Pancreas: Normal contours without ductal dilatation. No peripancreatic fluid collection. Spleen: Unremarkable. Adrenals/Urinary Tract: --Adrenal glands: Unremarkable. --Right kidney/ureter: No hydronephrosis or radiopaque kidney stones. --Left kidney/ureter: No hydronephrosis or radiopaque kidney stones. --Urinary bladder: Unremarkable. Stomach/Bowel: --Stomach/Duodenum: No hiatal hernia or other gastric abnormality. Normal duodenal course and caliber. --Small bowel: Unremarkable. --Colon: Unremarkable. --Appendix: Normal. Vascular/Lymphatic: Normal course and caliber of the major abdominal vessels. --No retroperitoneal lymphadenopathy. --No mesenteric lymphadenopathy. --No pelvic or inguinal lymphadenopathy. Reproductive: Unremarkable Other: No ascites or free air. The abdominal wall is normal. Musculoskeletal. No acute displaced fractures. IMPRESSION: 1. No acute abdominopelvic abnormality. 2. Trace right-sided pleural effusions, right greater than left. 3. Hepatic steatosis. Electronically Signed   By: Constance Holster M.D.   On: 07/01/2019 21:48       Procedures   ____________________________________________   INITIAL IMPRESSION / MDM / ASSESSMENT AND PLAN / ED COURSE  As part of my medical decision making, I reviewed the following data within the electronic MEDICAL RECORD NUMBER    44 year old male presented with above-stated history and physical exam differential diagnosis including but not limited to gout, cellulitis, kidney stone musculoskeletal etiology of the patient's right flank pain.  CT revealed no acute abdominal pelvic pathology.  Uric acid on blood work elevated at 9.6.  As such patient given colchicine with marked improvement in discomfort.  Patient also given Keflex given possibility of concomitant cellulitis.  Patient will be prescribed Keflex for home and indomethacin.  ____________________________________________  FINAL CLINICAL  IMPRESSION(S) / ED DIAGNOSES  Final diagnoses:  Acute gout of right foot, unspecified cause  Flank pain     MEDICATIONS GIVEN DURING THIS VISIT:  Medications  colchicine tablet 0.6 mg (0.6 mg Oral Given 07/01/19 2329)     ED Discharge Orders         Ordered    cephALEXin (KEFLEX) 500 MG capsule  2 times daily     Discontinue  Reprint     07/01/19 2304    indomethacin (INDOCIN) 50 MG capsule  3 times daily PRN     Discontinue  Reprint     07/01/19 2304          *Please note:  Rykin Route. was evaluated in Emergency Department on 07/02/2019 for the symptoms described in the history of present illness. He was evaluated in the context of the global COVID-19 pandemic, which necessitated consideration that the patient might be at risk for infection with the SARS-CoV-2 virus that causes COVID-19. Institutional protocols and algorithms that pertain to the evaluation of patients at risk for COVID-19 are in a state of rapid change based on information released by regulatory bodies including the CDC and federal and state organizations. These policies and algorithms were followed  during the patient's care in the ED.  Some ED evaluations and interventions may be delayed as a result of limited staffing during and after the pandemic.*  Note:  This document was prepared using Dragon voice recognition software and may include unintentional dictation errors.   Gregor Hams, MD 07/02/19 [phone]

## 2019-07-02 NOTE — ED Notes (Signed)
Report called and given to [facility]. Discharge instructions discussed, per group home they are arranging pick up now

## 2019-07-07 ENCOUNTER — Other Ambulatory Visit: Payer: Self-pay

## 2019-07-07 MED ORDER — OMEPRAZOLE 20 MG PO CPDR: 20.0000 mg | DELAYED_RELEASE_CAPSULE | Freq: Every day | ORAL | 1 refills | Status: DC

## 2019-07-08 ENCOUNTER — Ambulatory Visit (INDEPENDENT_AMBULATORY_CARE_PROVIDER_SITE_OTHER): Payer: 59 | Admitting: Internal Medicine

## 2019-07-08 ENCOUNTER — Other Ambulatory Visit: Payer: Self-pay

## 2019-07-08 ENCOUNTER — Encounter: Payer: Self-pay | Admitting: Internal Medicine

## 2019-07-08 VITALS — BP 117/71 | HR 96 | Ht 72.0 in | Wt 307.4 lb

## 2019-07-08 DIAGNOSIS — I10 Essential (primary) hypertension: Secondary | ICD-10-CM

## 2019-07-08 DIAGNOSIS — F200 Paranoid schizophrenia: Secondary | ICD-10-CM

## 2019-07-08 DIAGNOSIS — M10371 Gout due to renal impairment, right ankle and foot: Secondary | ICD-10-CM | POA: Diagnosis not present

## 2019-07-08 DIAGNOSIS — R400 Somnolence: Secondary | ICD-10-CM | POA: Diagnosis not present

## 2019-07-08 DIAGNOSIS — E139 Other specified diabetes mellitus without complications: Secondary | ICD-10-CM | POA: Diagnosis not present

## 2019-07-08 DIAGNOSIS — G4733 Obstructive sleep apnea (adult) (pediatric): Secondary | ICD-10-CM | POA: Diagnosis not present

## 2019-07-08 NOTE — Progress Notes (Signed)
Established Patient Office Visit  SUBJECTIVE:  Subjective  Patient ID: John Wallman., male    DOB: 1975-02-02  Age: 44 y.o. MRN: [id number]  CC:  Chief Complaint  Patient presents with  . Diabetes    patient's blood sugar was checked at home and was 142    HPI John [name]. is a 44 y.o. male presenting today for a diabetes check. He is here today with a family member.   Herny notes that he checked his blood sugar at home and it was 142 this morning.   He recently went to the ED for flank pain and foot pain. Renal stone CT revealed no acute abdominopelvic abnormality, trace right-sided pleural effusions, right greater than left, and hepatic steatosis. He was treated for gout of the right foot with colchicine 0.6 mg. He was sent home with Keflex 500 mg BID and indomethacin 50 mg TID PRN.   Past Medical History:  Diagnosis Date  . Anemia   . Asthma   . Diabetes mellitus   . Morbid obesity ([place])   . Paranoid schizophrenia ([place])   . Personality disorder ([place])   . Sleep apnea   . Thyroid disease     Past Surgical History:  Procedure Laterality Date  . FRACTURE SURGERY      Family History  Problem Relation Age of Onset  . Glaucoma Mother     Social History   Socioeconomic History  . Marital status: Single    Spouse name: Not on file  . Number of children: Not on file  . Years of education: Not on file  . Highest education level: Not on file  Occupational History  . Not on file  Tobacco Use  . Smoking status: Former Research scientist (life sciences)  . Smokeless tobacco: Never Used  Vaping Use  . Vaping Use: Never used  Substance and Sexual Activity  . Alcohol use: Not Currently    Comment: only once in a while  . Drug use: No  . Sexual activity: Not on file  Other Topics Concern  . Not on file  Social History Narrative  . Not on file   Social Determinants of Health   Financial Resource Strain:   . Difficulty of Paying Living Expenses:   Food Insecurity:   . Worried  About Charity fundraiser in the Last Year:   . Arboriculturist in the Last Year:   Transportation Needs:   . Film/video editor (Medical):   Marland Kitchen Lack of Transportation (Non-Medical):   Physical Activity:   . Days of Exercise per Week:   . Minutes of Exercise per Session:   Stress:   . Feeling of Stress :   Social Connections:   . Frequency of Communication with Friends and Family:   . Frequency of Social Gatherings with Friends and Family:   . Attends Religious Services:   . Active Member of Clubs or Organizations:   . Attends Archivist Meetings:   Marland Kitchen Marital Status:   Intimate Partner Violence:   . Fear of Current or Ex-Partner:   . Emotionally Abused:   Marland Kitchen Physically Abused:   . Sexually Abused:      Current Outpatient Medications:  .  acetaminophen (TYLENOL) 500 MG tablet, Take by mouth., Disp: , Rfl:  .  albuterol (PROVENTIL) (2.5 MG/3ML) 0.083% nebulizer solution, Inhale into the lungs., Disp: , Rfl:  .  asenapine (SAPHRIS) 5 MG SUBL 24 hr tablet, Place 1 tablet (5 mg  total) 2 (two) times daily under the tongue., Disp: 60 tablet, Rfl: 0 .  atorvastatin (LIPITOR) 10 MG tablet, Take 10 mg by mouth daily., Disp: , Rfl:  .  cephALEXin (KEFLEX) 500 MG capsule, Take 1 capsule (500 mg total) by mouth 2 (two) times daily for 10 days., Disp: 20 capsule, Rfl: 0 .  cloZAPine (CLOZARIL) 100 MG tablet, Take 100 mg by mouth daily., Disp: , Rfl:  .  divalproex (DEPAKOTE ER) 500 MG 24 hr tablet, Take 2,000 mg by mouth at bedtime. , Disp: , Rfl:  .  furosemide (LASIX) 20 MG tablet, TAKE 3 TABLETS (60MG ) BY MOUTH ONCE DAILY, Disp: 90 tablet, Rfl: 10 .  glipiZIDE (GLUCOTROL) 5 MG tablet, Take 0.5 tablets (2.5 mg total) daily before breakfast by mouth., Disp: 30 tablet, Rfl: 0 .  haloperidol (HALDOL) 5 MG tablet, Take 5 mg by mouth 2 (two) times daily. , Disp: , Rfl:  .  JANUVIA 100 MG tablet, TAKE 1 TABLET BY MOUTH ONCE DAILY, Disp: 30 tablet, Rfl: 10 .  levothyroxine (SYNTHROID,  LEVOTHROID) 75 MCG tablet, Take 75 mcg by mouth daily., Disp: , Rfl:  .  loratadine (CLARITIN) 10 MG tablet, Take 10 mg by mouth daily., Disp: , Rfl:  .  losartan (COZAAR) 50 MG tablet, Take 50 mg by mouth daily., Disp: , Rfl:  .  meloxicam (MOBIC) 15 MG tablet, Take 1 tablet (15 mg total) by mouth daily., Disp: 30 tablet, Rfl: 0 .  metFORMIN (GLUCOPHAGE) 1000 MG tablet, Take 1,000 mg by mouth daily., Disp: , Rfl:  .  omeprazole (PRILOSEC) 20 MG capsule, Take 1 capsule (20 mg total) by mouth daily., Disp: 90 capsule, Rfl: 1 .  oxybutynin (DITROPAN) 5 MG tablet, Take 5 mg by mouth daily., Disp: , Rfl:  .  atenolol (TENORMIN) 50 MG tablet, Take 1 tablet (50 mg total) daily by mouth. (Patient not taking: Reported on 07/08/2019), Disp: 30 tablet, Rfl: 0 .  colchicine 0.6 MG tablet, Take 1 tablet (0.6 mg total) by mouth daily. (Patient not taking: Reported on 07/08/2019), Disp: 30 tablet, Rfl: 2 .  ibuprofen (ADVIL,MOTRIN) 600 MG tablet, Take 1 tablet (600 mg total) by mouth every 6 (six) hours as needed (foot pain). (Patient not taking: Reported on 07/08/2019), Disp: 12 tablet, Rfl: 0 .  metFORMIN (GLUCOPHAGE) 500 MG tablet, Take 1 tablet (500 mg total) by mouth 2 (two) times daily with a meal., Disp: 60 tablet, Rfl: 0   No Known Allergies  ROS Review of Systems  Constitutional: Negative.   HENT: Negative.   Eyes: Negative.   Respiratory: Negative.   Cardiovascular: Negative.   Gastrointestinal: Negative.   Endocrine: Negative.   Genitourinary: Negative.   Musculoskeletal: Negative.   Skin: Negative.   Allergic/Immunologic: Negative.   Neurological: Negative.   Hematological: Negative.   Psychiatric/Behavioral: Negative.   All other systems reviewed and are negative.    OBJECTIVE:    Physical Exam Vitals reviewed.  Constitutional:      Appearance: Normal appearance.  Neck:     Vascular: No carotid bruit.  Cardiovascular:     Rate and Rhythm: Normal rate and regular rhythm.      Pulses: Normal pulses.     Heart sounds: Normal heart sounds.  Pulmonary:     Effort: Pulmonary effort is normal.     Breath sounds: Normal breath sounds.  Abdominal:     General: Bowel sounds are normal.     Palpations: Abdomen is soft. There is no hepatomegaly  or splenomegaly.     Tenderness: There is no abdominal tenderness.     Hernia: No hernia is present.  Musculoskeletal:     Right lower leg: No tenderness. 1+ Edema present.     Left lower leg: No edema.     Right ankle: No tenderness.     Right foot: No tenderness or bony tenderness.  Feet:     Right foot:     Toenail Condition: Right toenails are long.  Skin:    Findings: No rash.  Neurological:     Mental Status: He is alert and oriented to person, place, and time.  Psychiatric:        Mood and Affect: Mood normal.        Behavior: Behavior normal.     BP 117/71   Pulse 96   Ht 6' (1.829 m)   Wt (!) 307 lb 6.4 oz (139.4 kg)   BMI 41.69 kg/m  Wt Readings from Last 3 Encounters:  07/08/19 (!) 307 lb 6.4 oz (139.4 kg)  07/01/19 (!) 305 lb (138.3 kg)  06/18/19 (!) 305 lb 6.4 oz (138.5 kg)    Health Maintenance Due  Topic Date Due  . Hepatitis C Screening  Never done  . FOOT EXAM  Never done  . OPHTHALMOLOGY EXAM  Never done  . COVID-19 Vaccine (1) Never done  . TETANUS/TDAP  Never done  . HEMOGLOBIN A1C  05/01/2017    There are no preventive care reminders to display for this patient.  CBC Latest Ref Rng & Units 07/01/2019 05/23/2019 12/13/2018  WBC 4.0 - 10.5 K/uL 7.2 6.2 8.4  Hemoglobin 13.0 - 17.0 g/dL 12.6(L) 12.3(L) 12.8(L)  Hematocrit 39 - 52 % 38.8(L) 38.8(L) 41.1  Platelets 150 - 400 K/uL 207 157 199   CMP Latest Ref Rng & Units 07/01/2019 05/23/2019 12/13/2018  Glucose 70 - 99 mg/dL 232(H) 313(H) 196(H)  BUN 6 - 20 mg/dL 14 19 14   Creatinine 0.61 - 1.24 mg/dL 1.08 1.34(H) 1.11  Sodium 135 - 145 mmol/L 136 132(L) 132(L)  Potassium 3.5 - 5.1 mmol/L 4.3 4.6 4.1  Chloride 98 - 111 mmol/L 95(L)  97(L) 94(L)  CO2 22 - 32 mmol/L 31 27 27   Calcium 8.9 - 10.3 mg/dL 9.2 8.6(L) 8.8(L)  Total Protein 6.5 - 8.1 g/dL - - 9.2(H)  Total Bilirubin 0.3 - 1.2 mg/dL - - 0.5  Alkaline Phos 38 - 126 U/L - - 52  AST 15 - 41 U/L - - 15  ALT 0 - 44 U/L - - 20    Lab Results  Component Value Date   TSH 1.842 11/01/2016   Lab Results  Component Value Date   ALBUMIN 4.0 12/13/2018   ANIONGAP 10 07/01/2019   Lab Results  Component Value Date   CHOL 146 11/24/2013   HDL 22 (L) 11/24/2013   LDLCALC 94 11/24/2013   Lab Results  Component Value Date   TRIG 152 11/24/2013   Lab Results  Component Value Date   HGBA1C 5.7 (H) 11/01/2016   HGBA1C 5.6 04/28/2016   HGBA1C 7.2 (H) 11/23/2013      ASSESSMENT & PLAN:   Problem List Items Addressed This Visit      Cardiovascular and Mediastinum   Essential hypertension (Chronic)    Blood pressure is stable on today's visit        Respiratory   OSA (obstructive sleep apnea)    Patient has been advised to lose weight and walk daily.  Endocrine   Diabetes mellitus ([place])    Blood sugar 142 today he is following his diet.        Musculoskeletal and Integument   Acute gout due to renal impairment involving right foot - Primary    Patient was seen in the hospital with acute gouty arthritis of the right little toe which is getting better.  He has 1+ pedal edema.  There is no evidence of cellulitis patient is also doing epsom soaks        Other   Paranoid schizophrenia, chronic condition ([place]) (Chronic)    Mental status exam is normal      Somnolence    Patient was advised to lose weight his sleepiness is due to hypoventilation syndrome.         No orders of the defined types were placed in this encounter.     Follow-up: No follow-ups on file.    Dr. Jane Canary [facility] [address], [place], [place] [address]   By signing my name below, I, [employer], attest that this documentation has  been prepared under the direction and in the presence of Cletis Athens, MD. Electronically Signed: Cletis Athens, MD 07/08/19, 11:59 AM   I personally performed the services described in this documentation, which was SCRIBED in my presence. The recorded information has been reviewed and considered accurate. It has been edited as necessary during review. Cletis Athens, MD

## 2019-07-08 NOTE — Assessment & Plan Note (Signed)
Blood pressure is stable on today's visit

## 2019-07-08 NOTE — Assessment & Plan Note (Signed)
Blood sugar 142 today he is following his diet.

## 2019-07-08 NOTE — Assessment & Plan Note (Signed)
Patient has been advised to lose weight and walk daily.

## 2019-07-08 NOTE — Assessment & Plan Note (Signed)
Mental status exam is normal

## 2019-07-08 NOTE — Assessment & Plan Note (Signed)
Patient was advised to lose weight his sleepiness is due to hypoventilation syndrome.

## 2019-07-08 NOTE — Assessment & Plan Note (Signed)
Patient was seen in the hospital with acute gouty arthritis of the right little toe which is getting better.  He has 1+ pedal edema.  There is no evidence of cellulitis patient is also doing epsom soaks

## 2019-07-13 ENCOUNTER — Other Ambulatory Visit: Payer: Self-pay | Admitting: Internal Medicine

## 2019-07-13 ENCOUNTER — Other Ambulatory Visit: Payer: Self-pay

## 2019-07-20 ENCOUNTER — Other Ambulatory Visit: Payer: Self-pay

## 2019-07-20 ENCOUNTER — Encounter: Payer: Self-pay | Admitting: Internal Medicine

## 2019-07-20 ENCOUNTER — Ambulatory Visit (INDEPENDENT_AMBULATORY_CARE_PROVIDER_SITE_OTHER): Payer: 59 | Admitting: Internal Medicine

## 2019-07-20 VITALS — BP 110/74 | HR 100 | Ht 75.0 in | Wt 303.2 lb

## 2019-07-20 DIAGNOSIS — F200 Paranoid schizophrenia: Secondary | ICD-10-CM

## 2019-07-20 DIAGNOSIS — G4733 Obstructive sleep apnea (adult) (pediatric): Secondary | ICD-10-CM | POA: Diagnosis not present

## 2019-07-20 DIAGNOSIS — E1169 Type 2 diabetes mellitus with other specified complication: Secondary | ICD-10-CM | POA: Diagnosis not present

## 2019-07-20 DIAGNOSIS — M1A0710 Idiopathic chronic gout, right ankle and foot, without tophus (tophi): Secondary | ICD-10-CM | POA: Diagnosis not present

## 2019-07-20 DIAGNOSIS — M10371 Gout due to renal impairment, right ankle and foot: Secondary | ICD-10-CM

## 2019-07-20 HISTORY — DX: Idiopathic chronic gout, right ankle and foot, without tophus (tophi): M1A.0710

## 2019-07-20 LAB — GLUCOSE, POCT (MANUAL RESULT ENTRY): POC Glucose: 126 mg/dl — AB (ref 70–99)

## 2019-07-20 MED ORDER — COLCHICINE 0.6 MG PO TABS: 0.6000 mg | ORAL_TABLET | Freq: Three times a day (TID) | ORAL | 0 refills | Status: DC

## 2019-07-20 NOTE — Assessment & Plan Note (Signed)
Patient has chronic diabetes mellitus which is of control off and on.  Caretaker is checking his blood sugar every day.  We will recheck his hemoglobin AIC in a month. Not to use any soft drinks.  Blood pressure is under control on the present medication.

## 2019-07-20 NOTE — Assessment & Plan Note (Signed)
Patient has exogenous obesity with some movements.  He is using the CPAP machine.  Also trying to lose weight.  ada diet was explained to him.  He seems to have a pickwickian kind of syndrome.

## 2019-07-20 NOTE — Assessment & Plan Note (Signed)
Patient was found to have swelling of the right foot.  Very tender in the first metatarsal joint.  1+ pedal edema.  Started the patient on colchicine he was given 15 pills.  We will continue to soak his right foot in the Epson salt and come back in a week if is not better.

## 2019-07-20 NOTE — Assessment & Plan Note (Signed)
Patient carries a diagnosis of paranoid schizophrenia.  He is nice straight does not have any problem with agitation violent behavior.  Patient has been in psychiatric care

## 2019-07-20 NOTE — Progress Notes (Signed)
Established Patient Office Visit  SUBJECTIVE:  Subjective  Patient ID: John Reinders., male    DOB: 1975/01/06  Age: 44 y.o. MRN: [id number]  CC:  Chief Complaint  Patient presents with  . Foot Swelling    x1 week (both feet)    HPI John [name]. is a 44 y.o. male presenting today for bilateral foot swelling.    He notes that he continues to have swelling of his feet. He completed his treatment regimen of Keflex. He has been regularly soaking his feet in epsom salt, but his feet continue to swell.   His most recent blood sugars were: 141, 169, 126.    Past Medical History:  Diagnosis Date  . Anemia   . Asthma   . Diabetes mellitus   . 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 Morbid obesity ([place])   . Paranoid schizophrenia ([place])   . Personality disorder ([place])   . Sleep apnea   . Thyroid disease     Past Surgical History:  Procedure Laterality Date  . FRACTURE SURGERY      Family History  Problem Relation Age of Onset  . Glaucoma Mother     Social History   Socioeconomic History  . Marital status: Single    Spouse name: Not on file  . Number of children: Not on file  . Years of education: Not on file  . Highest education level: Not on file  Occupational History  . Not on file  Tobacco Use  . Smoking status: Former Research scientist (life sciences)  . Smokeless tobacco: Never Used  Vaping Use  . Vaping Use: Never used  Substance and Sexual Activity  . Alcohol use: Not Currently    Comment: only once in a while  . Drug use: No  . Sexual activity: Not on file  Other Topics Concern  . Not on file  Social History Narrative  . Not on file   Social Determinants of Health   Financial Resource Strain:   . Difficulty of Paying Living Expenses:   Food Insecurity:   . Worried About Charity fundraiser in the Last Year:   . Arboriculturist in the Last Year:   Transportation Needs:   . Film/video editor (Medical):   Marland Kitchen Lack of Transportation  (Non-Medical):   Physical Activity:   . Days of Exercise per Week:   . Minutes of Exercise per Session:   Stress:   . Feeling of Stress :   Social Connections:   . Frequency of Communication with Friends and Family:   . Frequency of Social Gatherings with Friends and Family:   . Attends Religious Services:   . Active Member of Clubs or Organizations:   . Attends Archivist Meetings:   Marland Kitchen Marital Status:   Intimate Partner Violence:   . Fear of Current or Ex-Partner:   . Emotionally Abused:   Marland Kitchen Physically Abused:   . Sexually Abused:      Current Outpatient Medications:  .  acetaminophen (TYLENOL) 500 MG tablet, Take by mouth., Disp: , Rfl:  .  albuterol (PROVENTIL) (2.5 MG/3ML) 0.083% nebulizer solution, Inhale into the lungs., Disp: , Rfl:  .  asenapine (SAPHRIS) 5 MG SUBL 24 hr tablet, Place 1 tablet (5 mg total) 2 (two) times daily under the tongue., Disp: 60 tablet, Rfl: 0 .  atenolol (TENORMIN) 50 MG tablet, Take 1 tablet (50 mg total) daily by mouth., Disp: 30 tablet,  Rfl: 0 .  atorvastatin (LIPITOR) 10 MG tablet, Take 10 mg by mouth daily., Disp: , Rfl:  .  cloZAPine (CLOZARIL) 100 MG tablet, Take 100 mg by mouth daily., Disp: , Rfl:  .  divalproex (DEPAKOTE ER) 500 MG 24 hr tablet, Take 2,000 mg by mouth at bedtime. , Disp: , Rfl:  .  furosemide (LASIX) 20 MG tablet, TAKE 3 TABLETS (60MG ) BY MOUTH ONCE DAILY, Disp: 90 tablet, Rfl: 10 .  glipiZIDE (GLUCOTROL) 5 MG tablet, Take 0.5 tablets (2.5 mg total) daily before breakfast by mouth., Disp: 30 tablet, Rfl: 0 .  haloperidol (HALDOL) 5 MG tablet, Take 5 mg by mouth 2 (two) times daily. , Disp: , Rfl:  .  ibuprofen (ADVIL,MOTRIN) 600 MG tablet, Take 1 tablet (600 mg total) by mouth every 6 (six) hours as needed (foot pain)., Disp: 12 tablet, Rfl: 0 .  JANUVIA 100 MG tablet, TAKE 1 TABLET BY MOUTH ONCE DAILY, Disp: 30 tablet, Rfl: 10 .  levothyroxine (SYNTHROID, LEVOTHROID) 75 MCG tablet, Take 75 mcg by mouth daily.,  Disp: , Rfl:  .  loratadine (CLARITIN) 10 MG tablet, Take 10 mg by mouth daily., Disp: , Rfl:  .  losartan (COZAAR) 50 MG tablet, Take 50 mg by mouth daily., Disp: , Rfl:  .  meloxicam (MOBIC) 15 MG tablet, Take 1 tablet (15 mg total) by mouth daily., Disp: 30 tablet, Rfl: 0 .  metFORMIN (GLUCOPHAGE) 1000 MG tablet, Take 1,000 mg by mouth daily., Disp: , Rfl:  .  omeprazole (PRILOSEC) 20 MG capsule, Take 1 capsule (20 mg total) by mouth daily., Disp: 90 capsule, Rfl: 1 .  oxybutynin (DITROPAN) 5 MG tablet, Take 5 mg by mouth daily., Disp: , Rfl:  .  TRUE METRIX BLOOD GLUCOSE TEST test strip, FINGERSTICK BLOOD SUGAR ONCE DAILY, Disp: 50 each, Rfl: 10 .  colchicine 0.6 MG tablet, Take 1 tablet (0.6 mg total) by mouth 3 (three) times daily for 5 days., Disp: 15 tablet, Rfl: 0 .  metFORMIN (GLUCOPHAGE) 500 MG tablet, Take 1 tablet (500 mg total) by mouth 2 (two) times daily with a meal., Disp: 60 tablet, Rfl: 0   No Known Allergies  ROS Review of Systems  Constitutional: Negative.   HENT: Negative.   Eyes: Negative.   Respiratory: Negative.  Negative for shortness of breath.   Cardiovascular: Positive for leg swelling. Negative for chest pain and palpitations.  Gastrointestinal: Negative.   Endocrine: Negative.   Genitourinary: Negative.   Musculoskeletal: Negative.   Skin: Negative.   Allergic/Immunologic: Negative.   Neurological: Negative.   Hematological: Negative.   Psychiatric/Behavioral: Negative.   All other systems reviewed and are negative.    OBJECTIVE:    Physical Exam Vitals reviewed.  Constitutional:      Appearance: Normal appearance.  Neck:     Vascular: No carotid bruit.  Cardiovascular:     Rate and Rhythm: Normal rate and regular rhythm.     Pulses: Normal pulses.     Heart sounds: Normal heart sounds.  Pulmonary:     Effort: Pulmonary effort is normal.     Breath sounds: Normal breath sounds.  Abdominal:     General: Bowel sounds are normal.      Palpations: Abdomen is soft. There is no hepatomegaly or splenomegaly.     Tenderness: There is no abdominal tenderness.     Hernia: No hernia is present.  Musculoskeletal:     Right ankle: Swelling present.     Left ankle: Swelling  present.     Right foot: Swelling present.     Left foot: Swelling present.  Skin:    Findings: No rash.  Neurological:     Mental Status: He is alert and oriented to person, place, and time.  Psychiatric:        Mood and Affect: Mood normal.        Behavior: Behavior normal.     BP 110/74   Pulse 100   Ht 6\' 3"  (1.905 m)   Wt (!) 303 lb 3.2 oz (137.5 kg)   BMI 37.90 kg/m  Wt Readings from Last 3 Encounters:  07/20/19 (!) 303 lb 3.2 oz (137.5 kg)  07/08/19 (!) 307 lb 6.4 oz (139.4 kg)  07/01/19 (!) 305 lb (138.3 kg)    Health Maintenance Due  Topic Date Due  . Hepatitis C Screening  Never done  . FOOT EXAM  Never done  . OPHTHALMOLOGY EXAM  Never done  . COVID-19 Vaccine (1) Never done  . TETANUS/TDAP  Never done  . HEMOGLOBIN A1C  05/01/2017    There are no preventive care reminders to display for this patient.  CBC Latest Ref Rng & Units 07/01/2019 05/23/2019 12/13/2018  WBC 4.0 - 10.5 K/uL 7.2 6.2 8.4  Hemoglobin 13.0 - 17.0 g/dL 12.6(L) 12.3(L) 12.8(L)  Hematocrit 39 - 52 % 38.8(L) 38.8(L) 41.1  Platelets 150 - 400 K/uL 207 157 199   CMP Latest Ref Rng & Units 07/01/2019 05/23/2019 12/13/2018  Glucose 70 - 99 mg/dL 232(H) 313(H) 196(H)  BUN 6 - 20 mg/dL 14 19 14   Creatinine 0.61 - 1.24 mg/dL 1.08 1.34(H) 1.11  Sodium 135 - 145 mmol/L 136 132(L) 132(L)  Potassium 3.5 - 5.1 mmol/L 4.3 4.6 4.1  Chloride 98 - 111 mmol/L 95(L) 97(L) 94(L)  CO2 22 - 32 mmol/L 31 27 27   Calcium 8.9 - 10.3 mg/dL 9.2 8.6(L) 8.8(L)  Total Protein 6.5 - 8.1 g/dL - - 9.2(H)  Total Bilirubin 0.3 - 1.2 mg/dL - - 0.5  Alkaline Phos 38 - 126 U/L - - 52  AST 15 - 41 U/L - - 15  ALT 0 - 44 U/L - - 20    Lab Results  Component Value Date   TSH 1.842  11/01/2016   Lab Results  Component Value Date   ALBUMIN 4.0 12/13/2018   ANIONGAP 10 07/01/2019   Lab Results  Component Value Date   CHOL 146 11/24/2013   HDL 22 (L) 11/24/2013   LDLCALC 94 11/24/2013   Lab Results  Component Value Date   TRIG 152 11/24/2013   Lab Results  Component Value Date   HGBA1C 5.7 (H) 11/01/2016   HGBA1C 5.6 04/28/2016   HGBA1C 7.2 (H) 11/23/2013      ASSESSMENT & PLAN:   Problem List Items Addressed This Visit      Respiratory   OSA (obstructive sleep apnea)    Patient has exogenous obesity with somnelence He is using the CPAP machine.  Also trying to lose weight.  ada diet was explained to him.  He seems to have a pickwickian kind of syndrome.        Endocrine   Diabetes mellitus ([place])    Patient has chronic diabetes mellitus which is of control off and on.  Caretaker is checking his blood sugar every day.  We will recheck his hemoglobin AIC in a month. Not to use any soft drinks.  Blood pressure is under control on the present medication.  Relevant Orders   POCT glucose (manual entry) (Completed)     Musculoskeletal and Integument   Acute gout due to renal impairment involving right foot    Patient was found to have swelling of the right foot.  Very tender in the first metatarsal joint.  1+ pedal edema.  Started the patient on colchicine he was given 15 pills.  We will continue to soak his right foot in the [place] salt and come back in a week if is not better.      Relevant Medications   colchicine 0.6 MG tablet              Other   Paranoid schizophrenia, chronic condition ([name]) (Chronic)    Patient carries a diagnosis of paranoid schizophrenia.  He is nice straight does not have any problem with agitation violent behavior.  Patient has been in psychiatric care         Meds ordered this encounter  Medications  . colchicine 0.6 MG tablet    Sig: Take 1 tablet (0.6 mg total) by mouth 3 (three) times daily for 5 days.      Dispense:  15 tablet    Refill:  0     Follow-up: No follow-ups on file.    Dr. Jane Canary [facility] [address] [place], [place] [address]   By signing my name below, I, [employer], attest that this documentation has been prepared under the direction and in the presence of Cletis Athens, MD. Electronically Signed: Cletis Athens, MD 07/20/19, 1:10 PM   I personally performed the services described in this documentation, which was SCRIBED in my presence. The recorded information has been reviewed and considered accurate. It has been edited as necessary during review. Cletis Athens, MD

## 2019-07-22 ENCOUNTER — Other Ambulatory Visit: Payer: Self-pay

## 2019-07-22 DIAGNOSIS — M1A0710 Idiopathic chronic gout, right ankle and foot, without tophus (tophi): Secondary | ICD-10-CM

## 2019-07-22 MED ORDER — COLCHICINE 0.6 MG PO TABS: 0.6000 mg | ORAL_TABLET | Freq: Three times a day (TID) | ORAL | 0 refills | Status: DC

## 2019-08-05 ENCOUNTER — Other Ambulatory Visit: Payer: Self-pay | Admitting: Internal Medicine

## 2019-08-09 ENCOUNTER — Encounter: Payer: Self-pay | Admitting: Emergency Medicine

## 2019-08-09 ENCOUNTER — Emergency Department: Payer: 59

## 2019-08-09 ENCOUNTER — Other Ambulatory Visit: Payer: Self-pay

## 2019-08-09 ENCOUNTER — Emergency Department
Admission: EM | Admit: 2019-08-09 | Discharge: 2019-08-09 | Disposition: A | Discharge status: Home / Self Care | Payer: 59 | Attending: Emergency Medicine | Admitting: Emergency Medicine

## 2019-08-09 DIAGNOSIS — M25562 Pain in left knee: Secondary | ICD-10-CM | POA: Diagnosis present

## 2019-08-09 DIAGNOSIS — Z79890 Hormone replacement therapy (postmenopausal): Secondary | ICD-10-CM | POA: Insufficient documentation

## 2019-08-09 DIAGNOSIS — Z7984 Long term (current) use of oral hypoglycemic drugs: Secondary | ICD-10-CM | POA: Diagnosis not present

## 2019-08-09 DIAGNOSIS — J449 Chronic obstructive pulmonary disease, unspecified: Secondary | ICD-10-CM | POA: Insufficient documentation

## 2019-08-09 DIAGNOSIS — E039 Hypothyroidism, unspecified: Secondary | ICD-10-CM | POA: Insufficient documentation

## 2019-08-09 DIAGNOSIS — I10 Essential (primary) hypertension: Secondary | ICD-10-CM | POA: Diagnosis not present

## 2019-08-09 DIAGNOSIS — Z87891 Personal history of nicotine dependence: Secondary | ICD-10-CM | POA: Insufficient documentation

## 2019-08-09 DIAGNOSIS — E119 Type 2 diabetes mellitus without complications: Secondary | ICD-10-CM | POA: Diagnosis not present

## 2019-08-09 DIAGNOSIS — Z79899 Other long term (current) drug therapy: Secondary | ICD-10-CM | POA: Diagnosis not present

## 2019-08-09 DIAGNOSIS — J45909 Unspecified asthma, uncomplicated: Secondary | ICD-10-CM | POA: Diagnosis not present

## 2019-08-09 MED ORDER — MELOXICAM 15 MG PO TABS: 15.0000 mg | ORAL_TABLET | Freq: Every day | ORAL | 1 refills | Status: AC

## 2019-08-09 NOTE — ED Notes (Signed)
See triage note, pt reports pain to left leg x4 days. Increased pain with walking, pt noted with limp, w/c used to treatment room.  Denies fall/injury.  Mother at bedside, reports pt lives at group home No redness/swelling noted Denies taking any medications for pain

## 2019-08-09 NOTE — ED Notes (Addendum)
Call Mr Brayton El (care taker)- when pt ready for d/c  [phone]  Or try-  [phone]

## 2019-08-09 NOTE — ED Notes (Signed)
This RN spoke to Mr John [name] and mother regarding d/c, denies questions and concerns at this time

## 2019-08-09 NOTE — Discharge Instructions (Signed)
Take Meloxicam once daily for pain and inflammation.  

## 2019-08-09 NOTE — ED Notes (Signed)
John [name] contacted by this RN regarding d/c

## 2019-08-09 NOTE — ED Provider Notes (Signed)
Emergency Department Provider Note  ____________________________________________  Time seen: Approximately 8:08 PM  I have reviewed the triage vital signs and the nursing notes.   HISTORY  Chief Complaint Leg Pain   Historian Patient     HPI John [name]. is a 44 y.o. male presents to the emergency department with acute posterior left knee pain for the past 4 days.  Mom states that she was visiting patient from out of town and noticed that patient was limping which is atypical for him.  She has not noticed any redness or fever today.  No prior history of DVT.  No complaints of chest pain, chest tightness or shortness of breath.  No recent falls or other mechanisms of injury.  No similar issues in the past.   Past Medical History:  Diagnosis Date  . Anemia   . Asthma   . Diabetes mellitus   . 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 Morbid obesity ([place])   . Paranoid schizophrenia ([place])   . Personality disorder ([place])   . Sleep apnea   . Thyroid disease      Immunizations up to date:  Yes.     Past Medical History:  Diagnosis Date  . Anemia   . Asthma   . Diabetes mellitus   . 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 Morbid obesity ([place])   . Paranoid schizophrenia ([place])   . Personality disorder ([place])   . Sleep apnea   . Thyroid disease     Patient Active Problem List   Diagnosis Date Noted  . Acute gout due to renal impairment involving right foot 07/08/2019  . Encephalopathy acute   . Somnolence 11/01/2016  . AKI (acute kidney injury) ([place]) 11/01/2016  . Sepsis ([place]) 04/27/2016  . Respiratory failure ([place]) 12/20/2015  . Cardiomyopathy ([place]) 11/28/2015  . Tobacco abuse 11/28/2015  . Acute respiratory failure with hypercapnia ([place])   . OSA (obstructive sleep apnea)   . Essential hypertension 11/25/2015  . Hypothyroidism 11/25/2015  . Paranoid schizophrenia, chronic condition ([place]) 11/25/2015  .  Acute respiratory failure ([place])   . Acute exacerbation of COPD with asthma ([place])   . Diabetes mellitus ([place])   . Diabetes mellitus due to underlying condition without complication, without long-term current use of insulin ([place])   . Morbid obesity ([place]) 09/29/2008    Past Surgical History:  Procedure Laterality Date  . FRACTURE SURGERY      Prior to Admission medications   Medication Sig Start Date End Date Taking? Authorizing Provider  acetaminophen (TYLENOL) 500 MG tablet Take by mouth.    [provider]  albuterol (PROVENTIL) (2.5 MG/3ML) 0.083% nebulizer solution Inhale into the lungs.    [provider]  asenapine (SAPHRIS) 5 MG SUBL 24 hr tablet Place 1 tablet (5 mg total) 2 (two) times daily under the tongue. 11/13/16   Bettey Costa, MD  atenolol (TENORMIN) 50 MG tablet Take 1 tablet (50 mg total) daily by mouth. 11/14/16   Bettey Costa, MD  atorvastatin (LIPITOR) 10 MG tablet Take 10 mg by mouth daily.    [provider]  cloZAPine (CLOZARIL) 100 MG tablet Take 100 mg by mouth daily.    [provider]  colchicine 0.6 MG tablet Take 1 tablet (0.6 mg total) by mouth 3 (three) times daily for 5 days. 07/22/19 07/27/19  Cletis Athens, MD  divalproex (DEPAKOTE ER) 500 MG 24 hr tablet Take 2,000 mg by mouth  at bedtime.     [provider]  furosemide (LASIX) 20 MG tablet TAKE 3 TABLETS (60MG ) BY MOUTH ONCE DAILY 06/11/19   Cletis Athens, MD  glipiZIDE (GLUCOTROL) 5 MG tablet Take 0.5 tablets (2.5 mg total) daily before breakfast by mouth. 11/13/16   Bettey Costa, MD  haloperidol (HALDOL) 5 MG tablet Take 5 mg by mouth 2 (two) times daily.     [provider]  ibuprofen (ADVIL,MOTRIN) 600 MG tablet Take 1 tablet (600 mg total) by mouth every 6 (six) hours as needed (foot pain). 01/04/17   Schaevitz, Randall An, MD  JANUVIA 100 MG tablet TAKE 1 TABLET BY MOUTH ONCE DAILY 06/11/19   Cletis Athens, MD  levothyroxine (SYNTHROID, LEVOTHROID) 75  MCG tablet Take 75 mcg by mouth daily.    [provider]  loratadine (CLARITIN) 10 MG tablet Take 10 mg by mouth daily.    [provider]  losartan (COZAAR) 50 MG tablet Take 50 mg by mouth daily.    [provider]  meloxicam (MOBIC) 15 MG tablet Take 1 tablet (15 mg total) by mouth daily for 7 days. 08/09/19 08/16/19  Lannie Fields, PA-C  metFORMIN (GLUCOPHAGE) 1000 MG tablet Take 1,000 mg by mouth daily. 06/05/19   [provider]  metFORMIN (GLUCOPHAGE) 500 MG tablet Take 1 tablet (500 mg total) by mouth 2 (two) times daily with a meal. 07/07/17 08/06/17  Arta Silence, MD  omeprazole (PRILOSEC) 20 MG capsule Take 1 capsule (20 mg total) by mouth daily. 07/07/19   Cletis Athens, MD  oxybutynin (DITROPAN) 5 MG tablet TAKE 1 TABLET BY MOUTH ONCE DAILY 08/06/19   Cletis Athens, MD  TRUE METRIX BLOOD GLUCOSE TEST test strip FINGERSTICK BLOOD SUGAR ONCE DAILY 07/14/19   Cletis Athens, MD    Allergies Patient has no known allergies.  Family History  Problem Relation Age of Onset  . Glaucoma Mother     Social History Social History   Tobacco Use  . Smoking status: Former Research scientist (life sciences)  . Smokeless tobacco: Never Used  Vaping Use  . Vaping Use: Never used  Substance Use Topics  . Alcohol use: Not Currently    Comment: only once in a while  . Drug use: No     Review of Systems  Constitutional: No fever/chills Eyes:  No discharge ENT: No upper respiratory complaints. Respiratory: no cough. No SOB/ use of accessory muscles to breath Gastrointestinal:   No nausea, no vomiting.  No diarrhea.  No constipation. Musculoskeletal: Patient has posterior left knee pain.  Skin: Negative for rash, abrasions, lacerations, ecchymosis.   ____________________________________________   PHYSICAL EXAM:  VITAL SIGNS: ED Triage Vitals  Enc Vitals Group     BP 08/09/19 1913 111/79     Pulse Rate 08/09/19 1913 (!) 107     Resp 08/09/19 1913 18     Temp 08/09/19 1913  99.4 F (37.4 C)     Temp Source 08/09/19 1913 Oral     SpO2 08/09/19 1913 96 %     Weight 08/09/19 1914 (!) 303 lb 3.2 oz (137.5 kg)     Height 08/09/19 1914 6\' 3"  (1.905 m)     Head Circumference --      Peak Flow --      Pain Score 08/09/19 1914 6     Pain Loc --      Pain Edu? --      Excl. in [place]? --      Constitutional: Alert and oriented.  Well appearing and in no acute distress. Eyes: Conjunctivae are normal. PERRL. EOMI. Head: Atraumatic. Cardiovascular: Normal rate, regular rhythm. Normal S1 and S2.  Good peripheral circulation. Respiratory: Normal respiratory effort without tachypnea or retractions. Lungs CTAB. Good air entry to the bases with no decreased or absent breath sounds Gastrointestinal: Bowel sounds x 4 quadrants. Soft and nontender to palpation. No guarding or rigidity. No distention. Musculoskeletal: Full range of motion to all extremities. No obvious deformities noted.  No deficits appreciated with provocative testing of the left knee.  Palpable dorsalis pedis pulse, left. Neurologic:  Normal for age. No gross focal neurologic deficits are appreciated.  Skin:  Skin is warm, dry and intact. No rash noted. Psychiatric: Mood and affect are normal for age. Speech and behavior are normal.   ____________________________________________   LABS (all labs ordered are listed, but only abnormal results are displayed)  Labs Reviewed - No data to display ____________________________________________  EKG   ____________________________________________  RADIOLOGY Unk Pinto, personally viewed and evaluated these images (plain radiographs) as part of my medical decision making, as well as reviewing the written report by the radiologist.    US Venous Img Lower Unilateral Left  Result Date: 08/09/2019 CLINICAL DATA:  Lower extremity pain EXAM: LEFT LOWER EXTREMITY VENOUS DOPPLER ULTRASOUND TECHNIQUE: Gray-scale sonography with compression, as well as color and  duplex ultrasound, were performed to evaluate the deep venous system(s) from the level of the common femoral vein through the popliteal and proximal calf veins. COMPARISON:  None. FINDINGS: VENOUS Normal compressibility of the common femoral, superficial femoral, and popliteal veins, as well as the visualized calf veins. Visualized portions of profunda femoral vein and great saphenous vein unremarkable. No filling defects to suggest DVT on grayscale or color Doppler imaging. Doppler waveforms show normal direction of venous flow, normal respiratory plasticity and response to augmentation. Limited views of the contralateral common femoral vein are unremarkable. OTHER None. Limitations: Calf veins not well visualized. IMPRESSION: No evidence of left lower extremity DVT. Electronically Signed   By: Rolm Baptise M.D.   On: 08/09/2019 22:27   DG Knee Complete 4 Views Left  Result Date: 08/09/2019 CLINICAL DATA:  Left knee pain EXAM: LEFT KNEE - COMPLETE 4+ VIEW COMPARISON:  None. FINDINGS: No acute bony abnormality. Specifically, no fracture, subluxation, or dislocation. Joint spaces maintained. No joint effusion. IMPRESSION: No acute bony abnormality. Electronically Signed   By: Rolm Baptise M.D.   On: 08/09/2019 20:34    ____________________________________________    PROCEDURES  Procedure(s) performed:     Procedures     Medications - No data to display   ____________________________________________   INITIAL IMPRESSION / ASSESSMENT AND PLAN / ED COURSE  Pertinent labs & imaging results that were available during my care of the patient were reviewed by me and considered in my medical decision making (see chart for details).      Assessment and Plan:  Left knee pain:  44 year old male presents to the emergency department with acute left knee pain.  Vital signs were reassuring in triage.  On physical exam, patient was able to perform full range of motion of the left knee.  No  deficits were noted with provocative testing of the left knee.   There was no evidence of thrombus formation on venous ultrasound.  X-ray of the left knee revealed no bony abnormality.  Patient was discharged with a short course of meloxicam.  He was advised to follow-up with orthopedics as needed.  ____________________________________________  FINAL CLINICAL IMPRESSION(S) / ED DIAGNOSES  Final diagnoses:  Acute pain of left knee      NEW MEDICATIONS STARTED DURING THIS VISIT:  ED Discharge Orders         Ordered    meloxicam (MOBIC) 15 MG tablet  Daily     Discontinue  Reprint     08/09/19 2243              This chart was dictated using voice recognition software/Dragon. Despite best efforts to proofread, errors can occur which can change the meaning. Any change was purely unintentional.     [place], [place], PA-C 08/09/19 [id number]    Delman Kitten, MD 08/10/19 [phone]

## 2019-08-09 NOTE — ED Triage Notes (Signed)
Pt arrived via POV with mother with reports of left leg pain x 4 days, pt states the pain is worse when walking around and painful to bear weight. Denies any injury.  Mother states they were walking around and noticed patient having difficulty getting out of the car due to the pain.

## 2019-08-10 ENCOUNTER — Encounter: Payer: Self-pay | Admitting: Internal Medicine

## 2019-08-10 ENCOUNTER — Ambulatory Visit (INDEPENDENT_AMBULATORY_CARE_PROVIDER_SITE_OTHER): Payer: 59 | Admitting: Internal Medicine

## 2019-08-10 VITALS — BP 120/82 | HR 105 | Ht 75.0 in | Wt 301.9 lb

## 2019-08-10 DIAGNOSIS — R400 Somnolence: Secondary | ICD-10-CM | POA: Diagnosis not present

## 2019-08-10 DIAGNOSIS — I10 Essential (primary) hypertension: Secondary | ICD-10-CM

## 2019-08-10 DIAGNOSIS — M1710 Unilateral primary osteoarthritis, unspecified knee: Secondary | ICD-10-CM | POA: Insufficient documentation

## 2019-08-10 DIAGNOSIS — G4733 Obstructive sleep apnea (adult) (pediatric): Secondary | ICD-10-CM | POA: Diagnosis not present

## 2019-08-10 DIAGNOSIS — E139 Other specified diabetes mellitus without complications: Secondary | ICD-10-CM | POA: Diagnosis not present

## 2019-08-10 MED ORDER — OZEMPIC (0.25 OR 0.5 MG/DOSE) 2 MG/1.5ML ~~LOC~~ SOPN: 0.5000 mg | PEN_INJECTOR | SUBCUTANEOUS | 4 refills | Status: DC

## 2019-08-10 NOTE — Progress Notes (Signed)
Established Patient Office Visit  SUBJECTIVE:  Subjective  Patient ID: John Laws., male    DOB: [birth year]  Age: 44 y.o. MRN: [id number]  CC:  Chief Complaint  Patient presents with  . Leg Pain    Pt report pain in the left leg, it started 4 days ago. Pt states there was no injury, he just woke up with it in pain.    HPI John [name]. is a 44 y.o. male presenting today for an evaluation of his left leg pain.  He presented to the [facility] ED on 08/09/2019 for acute left leg pain. He was walking with a limp. He denied any recent falls. He underwent a left lower extremity venous doppler ultrasound revealing no evidence of a DVT. He notes that he hasn't tried anything to help the pain so far.   Leg Pain  The incident occurred 3 to 5 days ago. There was no injury mechanism. The pain is present in the left leg and left knee. The quality of the pain is described as stabbing. The pain is at a severity of 7/10. The pain has been constant since onset. He reports no foreign bodies present. The symptoms are aggravated by movement. He has tried nothing for the symptoms.  Diabetes He presents for his follow-up diabetic visit. He has type 2 diabetes mellitus. His disease course has been worsening. Hypoglycemia symptoms include sleepiness. Symptoms are stable. Risk factors for coronary artery disease include diabetes mellitus, obesity, male sex and sedentary lifestyle. Current diabetic treatment includes diet and oral agent (monotherapy). He is compliant with treatment most of the time. He is following a generally unhealthy (His caretaker notes that he eats out a lot and drinks a lot of soda; she states that one day he drank a 2 Lt of normal Pepsi. The other night he notes eating a large fried seafood platter, and the night before that he ate two papadias from [facility].) diet. When asked about meal planning, he reported none. He rarely participates in exercise.   After obtaining consent, and per  orders of Cletis Athens, MD, injection of Kenalog given by Cletis Athens, MD. Patient instructed to remain in clinic for 20 minutes afterwards, and to report any adverse reaction to me immediately.  He has a CPAP machine, but his caretaker states that he will run it and not wear it. He states that it will fall off. He is visibly tired during today's appointment.   Past Medical History:  Diagnosis Date  . Anemia   . Asthma   . Diabetes mellitus   . 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 Morbid obesity ([place])   . Paranoid schizophrenia ([place])   . Personality disorder ([place])   . Sleep apnea   . Thyroid disease     Past Surgical History:  Procedure Laterality Date  . FRACTURE SURGERY      Family History  Problem Relation Age of Onset  . Glaucoma Mother     Social History   Socioeconomic History  . Marital status: Single    Spouse name: Not on file  . Number of children: Not on file  . Years of education: Not on file  . Highest education level: Not on file  Occupational History  . Not on file  Tobacco Use  . Smoking status: Former Research scientist (life sciences)  . Smokeless tobacco: Never Used  Vaping Use  . Vaping Use: Never used  Substance and Sexual Activity  .  Alcohol use: Not Currently    Comment: only once in a while  . Drug use: No  . Sexual activity: Not on file  Other Topics Concern  . Not on file  Social History Narrative  . Not on file   Social Determinants of Health   Financial Resource Strain:   . Difficulty of Paying Living Expenses:   Food Insecurity:   . Worried About Charity fundraiser in the Last Year:   . Arboriculturist in the Last Year:   Transportation Needs:   . Film/video editor (Medical):   Marland Kitchen Lack of Transportation (Non-Medical):   Physical Activity:   . Days of Exercise per Week:   . Minutes of Exercise per Session:   Stress:   . Feeling of Stress :   Social Connections:   . Frequency of Communication with Friends and  Family:   . Frequency of Social Gatherings with Friends and Family:   . Attends Religious Services:   . Active Member of Clubs or Organizations:   . Attends Archivist Meetings:   Marland Kitchen Marital Status:   Intimate Partner Violence:   . Fear of Current or Ex-Partner:   . Emotionally Abused:   Marland Kitchen Physically Abused:   . Sexually Abused:      Current Outpatient Medications:  .  acetaminophen (TYLENOL) 500 MG tablet, Take by mouth., Disp: , Rfl:  .  albuterol (PROVENTIL) (2.5 MG/3ML) 0.083% nebulizer solution, Inhale into the lungs., Disp: , Rfl:  .  asenapine (SAPHRIS) 5 MG SUBL 24 hr tablet, Place 1 tablet (5 mg total) 2 (two) times daily under the tongue., Disp: 60 tablet, Rfl: 0 .  atenolol (TENORMIN) 50 MG tablet, Take 1 tablet (50 mg total) daily by mouth., Disp: 30 tablet, Rfl: 0 .  atorvastatin (LIPITOR) 10 MG tablet, Take 10 mg by mouth daily., Disp: , Rfl:  .  cloZAPine (CLOZARIL) 100 MG tablet, Take 100 mg by mouth daily., Disp: , Rfl:  .  colchicine 0.6 MG tablet, Take 1 tablet (0.6 mg total) by mouth 3 (three) times daily for 5 days., Disp: 15 tablet, Rfl: 0 .  divalproex (DEPAKOTE ER) 500 MG 24 hr tablet, Take 2,000 mg by mouth at bedtime. , Disp: , Rfl:  .  furosemide (LASIX) 20 MG tablet, TAKE 3 TABLETS (60MG ) BY MOUTH ONCE DAILY, Disp: 90 tablet, Rfl: 10 .  glipiZIDE (GLUCOTROL) 5 MG tablet, Take 0.5 tablets (2.5 mg total) daily before breakfast by mouth., Disp: 30 tablet, Rfl: 0 .  haloperidol (HALDOL) 5 MG tablet, Take 5 mg by mouth 2 (two) times daily. , Disp: , Rfl:  .  ibuprofen (ADVIL,MOTRIN) 600 MG tablet, Take 1 tablet (600 mg total) by mouth every 6 (six) hours as needed (foot pain)., Disp: 12 tablet, Rfl: 0 .  JANUVIA 100 MG tablet, TAKE 1 TABLET BY MOUTH ONCE DAILY, Disp: 30 tablet, Rfl: 10 .  levothyroxine (SYNTHROID, LEVOTHROID) 75 MCG tablet, Take 75 mcg by mouth daily., Disp: , Rfl:  .  loratadine (CLARITIN) 10 MG tablet, Take 10 mg by mouth daily., Disp: ,  Rfl:  .  losartan (COZAAR) 50 MG tablet, Take 50 mg by mouth daily., Disp: , Rfl:  .  meloxicam (MOBIC) 15 MG tablet, Take 1 tablet (15 mg total) by mouth daily for 7 days., Disp: 30 tablet, Rfl: 1 .  metFORMIN (GLUCOPHAGE) 1000 MG tablet, Take 1,000 mg by mouth daily., Disp: , Rfl:  .  metFORMIN (GLUCOPHAGE) 500  MG tablet, Take 1 tablet (500 mg total) by mouth 2 (two) times daily with a meal., Disp: 60 tablet, Rfl: 0 .  omeprazole (PRILOSEC) 20 MG capsule, Take 1 capsule (20 mg total) by mouth daily., Disp: 90 capsule, Rfl: 1 .  oxybutynin (DITROPAN) 5 MG tablet, TAKE 1 TABLET BY MOUTH ONCE DAILY, Disp: 30 tablet, Rfl: 10 .  Semaglutide,0.25 or 0.5MG /DOS, (OZEMPIC, 0.25 OR 0.5 MG/DOSE,) 2 MG/1.5ML SOPN, Inject 0.375 mLs (0.5 mg total) into the skin once a week., Disp: 1.5 mL, Rfl: 4 .  TRUE METRIX BLOOD GLUCOSE TEST test strip, FINGERSTICK BLOOD SUGAR ONCE DAILY, Disp: 50 each, Rfl: 10   No Known Allergies  ROS Review of Systems  Constitutional: Negative.   HENT: Negative.   Eyes: Negative.   Respiratory: Negative.   Cardiovascular: Negative.   Gastrointestinal: Negative.   Endocrine: Negative.   Genitourinary: Negative.   Musculoskeletal: Negative.   Skin: Negative.   Allergic/Immunologic: Negative.   Neurological: Negative.   Hematological: Negative.   Psychiatric/Behavioral: Negative.   All other systems reviewed and are negative.    OBJECTIVE:    Physical Exam Vitals reviewed.  Constitutional:      Appearance: Normal appearance.  HENT:     Mouth/Throat:     Mouth: Mucous membranes are moist.  Eyes:     Pupils: Pupils are equal, round, and reactive to light.  Neck:     Vascular: No carotid bruit.  Cardiovascular:     Rate and Rhythm: Normal rate and regular rhythm.     Pulses: Normal pulses.     Heart sounds: Normal heart sounds.  Pulmonary:     Effort: Pulmonary effort is normal.     Breath sounds: Normal breath sounds.  Abdominal:     General: Bowel sounds  are normal.     Palpations: Abdomen is soft. There is no hepatomegaly, splenomegaly or mass.     Tenderness: There is no abdominal tenderness.     Hernia: No hernia is present.  Musculoskeletal:     Cervical back: Neck supple.     Right lower leg: No edema.     Left lower leg: No edema.  Skin:    Findings: No rash.  Neurological:     Mental Status: He is alert and oriented to person, place, and time.     Motor: No weakness.  Psychiatric:        Mood and Affect: Mood normal.        Behavior: Behavior normal.     BP 120/82   Pulse (!) 105   Ht 6\' 3"  (1.905 m)   Wt (!) 301 lb 14.4 oz (136.9 kg)   BMI 37.73 kg/m  Wt Readings from Last 3 Encounters:  08/10/19 (!) 301 lb 14.4 oz (136.9 kg)  08/09/19 (!) 303 lb 3.2 oz (137.5 kg)  07/20/19 (!) 303 lb 3.2 oz (137.5 kg)    Health Maintenance Due  Topic Date Due  . Hepatitis C Screening  Never done  . FOOT EXAM  Never done  . OPHTHALMOLOGY EXAM  Never done  . COVID-19 Vaccine (1) Never done  . TETANUS/TDAP  Never done  . HEMOGLOBIN A1C  05/01/2017  . INFLUENZA VACCINE  08/02/2019    There are no preventive care reminders to display for this patient.  CBC Latest Ref Rng & Units 07/01/2019 05/23/2019 12/13/2018  WBC 4.0 - 10.5 K/uL 7.2 6.2 8.4  Hemoglobin 13.0 - 17.0 g/dL 12.6(L) 12.3(L) 12.8(L)  Hematocrit 39 - 52 %  38.8(L) 38.8(L) 41.1  Platelets 150 - 400 K/uL 207 157 199   CMP Latest Ref Rng & Units 07/01/2019 05/23/2019 12/13/2018  Glucose 70 - 99 mg/dL 232(H) 313(H) 196(H)  BUN 6 - 20 mg/dL 14 19 14   Creatinine 0.61 - 1.24 mg/dL 1.08 1.34(H) 1.11  Sodium 135 - 145 mmol/L 136 132(L) 132(L)  Potassium 3.5 - 5.1 mmol/L 4.3 4.6 4.1  Chloride 98 - 111 mmol/L 95(L) 97(L) 94(L)  CO2 22 - 32 mmol/L 31 27 27   Calcium 8.9 - 10.3 mg/dL 9.2 8.6(L) 8.8(L)  Total Protein 6.5 - 8.1 g/dL - - 9.2(H)  Total Bilirubin 0.3 - 1.2 mg/dL - - 0.5  Alkaline Phos 38 - 126 U/L - - 52  AST 15 - 41 U/L - - 15  ALT 0 - 44 U/L - - 20    Lab  Results  Component Value Date   TSH 1.842 11/01/2016   Lab Results  Component Value Date   ALBUMIN 4.0 12/13/2018   ANIONGAP 10 07/01/2019   Lab Results  Component Value Date   CHOL 146 11/24/2013   HDL 22 (L) 11/24/2013   LDLCALC 94 11/24/2013   Lab Results  Component Value Date   TRIG 152 11/24/2013   Lab Results  Component Value Date   HGBA1C 5.7 (H) 11/01/2016   HGBA1C 5.6 04/28/2016   HGBA1C 7.2 (H) 11/23/2013      ASSESSMENT & PLAN:   Problem List Items Addressed This Visit      Cardiovascular and Mediastinum   Essential hypertension (Chronic)    Blood pressure is stable on Losartan        Respiratory   OSA (obstructive sleep apnea)    Pt was advised to use CPAP machine every day.        Endocrine   Diabetes mellitus ([place]) - Primary    Blood sugar was more than 200 today. He was given 5units of Humalog and also Ozempic 0.5mg  subq to be given once a week      Relevant Medications   Semaglutide,0.25 or 0.5MG /DOS, (OZEMPIC, 0.25 OR 0.5 MG/DOSE,) 2 MG/1.5ML SOPN     Musculoskeletal and Integument   Knee arthropathy    Left knee was swollen and tender. Pt had an US of the left leg completed in the hospital on 08/09/2019 which revealed no evidence of a DVT. He was given 20 mg of Kenalog under local anesthesia on left knee        Other   Somnolence    Pt doesn't use his CPAP, that's why he's feeling sleepy during the day.          Meds ordered this encounter  Medications  . Semaglutide,0.25 or 0.5MG /DOS, (OZEMPIC, 0.25 OR 0.5 MG/DOSE,) 2 MG/1.5ML SOPN    Sig: Inject 0.375 mLs (0.5 mg total) into the skin once a week.    Dispense:  1.5 mL    Refill:  4      Follow-up: Return in about 2 weeks (around 08/24/2019).   Joint Injection/Arthrocentesis  Date/Time: 08/10/2019 12:02 PM Performed by: Cletis Athens, MD Authorized by: Cletis Athens, MD  Indications: joint swelling and pain  Body area: knee Local anesthesia used:  yes  Anesthesia: Local anesthesia used: yes Local Anesthetic: lidocaine 1% without epinephrine and bupivacaine 0.25% without epinephrine  Sedation: Patient sedated: no  Needle size: 22 G Ultrasound guidance: no Approach: medial Aspirate: clear Triamcinolone amount: 20 mg Bupivacaine 0.25% amount: 1 mL Lidocaine 1% amount: 1 mL Patient tolerance: patient  tolerated the procedure well with no immediate complications Comments: Left knee was prepared with alcohol after informed consent was given. Pt was injected with 1% lidocaine anesthesia the medial side of the left knee with 20 mg of Kenalog and Marcan 1 cc. Pt tolerated the procedure well and was asked to wait in the waiting room for 3-4 minutes before going home.        Dr. Jane Canary [facility] [address], [place], [place] [address]   By signing my name below, I, [employer], attest that this documentation has been prepared under the direction and in the presence of Cletis Athens, MD. Electronically Signed: Cletis Athens, MD 08/10/19, 12:08 PM   I personally performed the services described in this documentation, which was SCRIBED in my presence. The recorded information has been reviewed and considered accurate. It has been edited as necessary during review. Cletis Athens, MD

## 2019-08-10 NOTE — Assessment & Plan Note (Signed)
Left knee was swollen and tender. Pt had an US of the left leg completed in the hospital on 08/09/2019 which revealed no evidence of a DVT. He was given 20 mg of Kenalog under local anesthesia on left knee

## 2019-08-10 NOTE — Assessment & Plan Note (Signed)
Blood pressure is stable on Losartan

## 2019-08-10 NOTE — Assessment & Plan Note (Signed)
Blood sugar was more than 200 today. He was given 5units of Humalog and also Ozempic 0.5mg  subq to be given once a week

## 2019-08-10 NOTE — Assessment & Plan Note (Signed)
Pt was advised to use CPAP machine every day.

## 2019-08-10 NOTE — Assessment & Plan Note (Signed)
Pt doesn't use his CPAP, that's why he's feeling sleepy during the day.

## 2019-08-12 ENCOUNTER — Encounter: Payer: Self-pay | Admitting: *Deleted

## 2019-08-12 ENCOUNTER — Other Ambulatory Visit: Payer: Self-pay

## 2019-08-12 DIAGNOSIS — I10 Essential (primary) hypertension: Secondary | ICD-10-CM | POA: Insufficient documentation

## 2019-08-12 DIAGNOSIS — E039 Hypothyroidism, unspecified: Secondary | ICD-10-CM | POA: Insufficient documentation

## 2019-08-12 DIAGNOSIS — Z79899 Other long term (current) drug therapy: Secondary | ICD-10-CM | POA: Insufficient documentation

## 2019-08-12 DIAGNOSIS — E119 Type 2 diabetes mellitus without complications: Secondary | ICD-10-CM | POA: Insufficient documentation

## 2019-08-12 DIAGNOSIS — J45909 Unspecified asthma, uncomplicated: Secondary | ICD-10-CM | POA: Diagnosis not present

## 2019-08-12 DIAGNOSIS — Z87891 Personal history of nicotine dependence: Secondary | ICD-10-CM | POA: Diagnosis not present

## 2019-08-12 DIAGNOSIS — M109 Gout, unspecified: Secondary | ICD-10-CM | POA: Diagnosis present

## 2019-08-12 DIAGNOSIS — Z79890 Hormone replacement therapy (postmenopausal): Secondary | ICD-10-CM | POA: Insufficient documentation

## 2019-08-12 LAB — CBC WITH DIFFERENTIAL/PLATELET
Abs Immature Granulocytes: 0.03 10*3/uL (ref 0.00–0.07)
Basophils Absolute: 0 10*3/uL (ref 0.0–0.1)
Basophils Relative: 0 %
Eosinophils Absolute: 0 10*3/uL (ref 0.0–0.5)
Eosinophils Relative: 0 %
HCT: 40 % (ref 39.0–52.0)
Hemoglobin: 12.6 g/dL — ABNORMAL LOW (ref 13.0–17.0)
Immature Granulocytes: 0 %
Lymphocytes Relative: 19 %
Lymphs Abs: 1.4 10*3/uL (ref 0.7–4.0)
MCH: 26.9 pg (ref 26.0–34.0)
MCHC: 31.5 g/dL (ref 30.0–36.0)
MCV: 85.5 fL (ref 80.0–100.0)
Monocytes Absolute: 1.2 10*3/uL — ABNORMAL HIGH (ref 0.1–1.0)
Monocytes Relative: 16 %
Neutro Abs: 4.6 10*3/uL (ref 1.7–7.7)
Neutrophils Relative %: 65 %
Platelets: 220 10*3/uL (ref 150–400)
RBC: 4.68 MIL/uL (ref 4.22–5.81)
RDW: 14.2 % (ref 11.5–15.5)
WBC: 7.3 10*3/uL (ref 4.0–10.5)
nRBC: 0 % (ref 0.0–0.2)

## 2019-08-12 LAB — COMPREHENSIVE METABOLIC PANEL
ALT: 15 U/L (ref 0–44)
AST: 13 U/L — ABNORMAL LOW (ref 15–41)
Albumin: 3.9 g/dL (ref 3.5–5.0)
Alkaline Phosphatase: 49 U/L (ref 38–126)
Anion gap: 12 (ref 5–15)
BUN: 11 mg/dL (ref 6–20)
CO2: 30 mmol/L (ref 22–32)
Calcium: 9.1 mg/dL (ref 8.9–10.3)
Chloride: 95 mmol/L — ABNORMAL LOW (ref 98–111)
Creatinine, Ser: 1.09 mg/dL (ref 0.61–1.24)
GFR calc Af Amer: >60 mL/min (ref >60)
GFR calc non Af Amer: >60 mL/min (ref >60)
Glucose, Bld: 203 mg/dL — ABNORMAL HIGH (ref 70–99)
Potassium: 3.9 mmol/L (ref 3.5–5.1)
Sodium: 137 mmol/L (ref 135–145)
Total Bilirubin: 0.8 mg/dL (ref 0.3–1.2)
Total Protein: 9.2 g/dL — ABNORMAL HIGH (ref 6.5–8.1)

## 2019-08-12 NOTE — ED Triage Notes (Signed)
Pt is from group home, reporting left groin pain that radiates down his leg area. Reports hx of gout to the same. Left leg feels swollen. Denies recent fevers. Seen recently for knee pain in the same leg.

## 2019-08-12 NOTE — ED Triage Notes (Signed)
First nurse note.  Pt has left knee pain.  No known injury per ems.  No deformity..  Pt is from a group home.

## 2019-08-13 ENCOUNTER — Emergency Department
Admission: EM | Admit: 2019-08-13 | Discharge: 2019-08-13 | Disposition: A | Discharge status: Home / Self Care | Payer: 59 | Attending: Emergency Medicine | Admitting: Emergency Medicine

## 2019-08-13 ENCOUNTER — Other Ambulatory Visit: Payer: Self-pay

## 2019-08-13 DIAGNOSIS — E139 Other specified diabetes mellitus without complications: Secondary | ICD-10-CM

## 2019-08-13 DIAGNOSIS — M109 Gout, unspecified: Secondary | ICD-10-CM

## 2019-08-13 DIAGNOSIS — M1A0710 Idiopathic chronic gout, right ankle and foot, without tophus (tophi): Secondary | ICD-10-CM

## 2019-08-13 LAB — URIC ACID: Uric Acid, Serum: 10.4 mg/dL — ABNORMAL HIGH (ref 3.7–8.6)

## 2019-08-13 MED ORDER — INDOMETHACIN 50 MG PO CAPS
50.0000 mg | ORAL_CAPSULE | Freq: Three times a day (TID) | ORAL | 0 refills | Status: AC | PRN
Start: 2019-08-13 — End: 2019-08-18

## 2019-08-13 MED ORDER — COLCHICINE 0.6 MG PO TABS: 0.6000 mg | ORAL_TABLET | Freq: Three times a day (TID) | ORAL | 0 refills | Status: DC

## 2019-08-13 MED ORDER — OZEMPIC (0.25 OR 0.5 MG/DOSE) 2 MG/1.5ML ~~LOC~~ SOPN: 0.5000 mg | PEN_INJECTOR | SUBCUTANEOUS | 4 refills | Status: DC

## 2019-08-13 MED ORDER — COLCHICINE 0.6 MG PO TABS
0.6000 mg | ORAL_TABLET | Freq: Once | ORAL | Status: AC
  Administered 2019-08-13: 0.6 mg via ORAL
  Filled 2019-08-13 (×2): qty 1

## 2019-08-13 NOTE — ED Notes (Signed)
This RN discussed discharge with Mr Brayton El at group home, reports he will come to pick up pt

## 2019-08-14 ENCOUNTER — Encounter: Payer: Self-pay | Admitting: Dietician

## 2019-08-17 ENCOUNTER — Encounter: Payer: Self-pay | Admitting: Internal Medicine

## 2019-08-17 ENCOUNTER — Ambulatory Visit (INDEPENDENT_AMBULATORY_CARE_PROVIDER_SITE_OTHER): Payer: 59 | Admitting: Internal Medicine

## 2019-08-17 ENCOUNTER — Other Ambulatory Visit: Payer: Self-pay

## 2019-08-17 VITALS — BP 124/76 | HR 113 | Temp 96.5°F | Ht 73.0 in | Wt 276.0 lb

## 2019-08-17 DIAGNOSIS — E1169 Type 2 diabetes mellitus with other specified complication: Secondary | ICD-10-CM

## 2019-08-17 DIAGNOSIS — E139 Other specified diabetes mellitus without complications: Secondary | ICD-10-CM

## 2019-08-17 DIAGNOSIS — R400 Somnolence: Secondary | ICD-10-CM | POA: Diagnosis not present

## 2019-08-17 DIAGNOSIS — G4733 Obstructive sleep apnea (adult) (pediatric): Secondary | ICD-10-CM

## 2019-08-17 DIAGNOSIS — I10 Essential (primary) hypertension: Secondary | ICD-10-CM

## 2019-08-17 DIAGNOSIS — R29898 Other symptoms and signs involving the musculoskeletal system: Secondary | ICD-10-CM | POA: Diagnosis not present

## 2019-08-17 DIAGNOSIS — M1710 Unilateral primary osteoarthritis, unspecified knee: Secondary | ICD-10-CM

## 2019-08-17 NOTE — Assessment & Plan Note (Signed)
Pt has weakness of the both legs. He has trouble getting up from a sitting position to a standing position. Sensation of both legs are intact. Pedal pulses are 2+. Motor power is reduced in both legs. There is no evidence of DVT. Pt ws able to walk to the bathroom for BM. And was able to come back to exam room by himself. He is using a walker to walk. I will send him to physical therapy. He will return back in 10 days for a recheck. If he gets worse he can go to the hospital. It was advised to the caretaker that he should go to a nursing home where more facilities are available to help him.

## 2019-08-17 NOTE — Assessment & Plan Note (Signed)
Blood sugar is being controlled with Ozempic.

## 2019-08-17 NOTE — Assessment & Plan Note (Signed)
BP is under control on losartin

## 2019-08-17 NOTE — Progress Notes (Signed)
Established Patient Office Visit  SUBJECTIVE:  Subjective  Patient ID: John [name]., male    DOB: 01/25/1975  Age: 44 y.o. MRN: [id number]  CC:  Chief Complaint  Patient presents with  . Follow-up    HPI John [name]. is a 44 y.o. male presenting today for a follow up to his gout and leg swelling.   He was seen in the [facility] ED on 08/12/2019 for left groin pain with left leg swelling. He denied fevers. He was prescribed Indocin 500 mg.   He is currently using a walker, but his group home state that he does not have an order for it so he can't have it at the group home. He notes that his feet ache when he gets out of bed. He notes difficulty standing from a sitting position. He needs an order for a walker in order to keep using one.   His Case Worker from his group home notes that he has been unmotivated to walk on his own despite medical advice.   Past Medical History:  Diagnosis Date  . Anemia   . Asthma   . Diabetes mellitus   . 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 Morbid obesity ([place])   . Paranoid schizophrenia ([place])   . Personality disorder ([place])   . Sleep apnea   . Thyroid disease     Past Surgical History:  Procedure Laterality Date  . FRACTURE SURGERY      Family History  Problem Relation Age of Onset  . Glaucoma Mother     Social History   Socioeconomic History  . Marital status: Single    Spouse name: Not on file  . Number of children: Not on file  . Years of education: Not on file  . Highest education level: Not on file  Occupational History  . Not on file  Tobacco Use  . Smoking status: Former Research scientist (life sciences)  . Smokeless tobacco: Never Used  Vaping Use  . Vaping Use: Never used  Substance and Sexual Activity  . Alcohol use: Not Currently    Comment: only once in a while  . Drug use: No  . Sexual activity: Not on file  Other Topics Concern  . Not on file  Social History Narrative  . Not on file   Social  Determinants of Health   Financial Resource Strain:   . Difficulty of Paying Living Expenses:   Food Insecurity:   . Worried About Charity fundraiser in the Last Year:   . Arboriculturist in the Last Year:   Transportation Needs:   . Film/video editor (Medical):   Marland Kitchen Lack of Transportation (Non-Medical):   Physical Activity:   . Days of Exercise per Week:   . Minutes of Exercise per Session:   Stress:   . Feeling of Stress :   Social Connections:   . Frequency of Communication with Friends and Family:   . Frequency of Social Gatherings with Friends and Family:   . Attends Religious Services:   . Active Member of Clubs or Organizations:   . Attends Archivist Meetings:   Marland Kitchen Marital Status:   Intimate Partner Violence:   . Fear of Current or Ex-Partner:   . Emotionally Abused:   Marland Kitchen Physically Abused:   . Sexually Abused:      Current Outpatient Medications:  .  acetaminophen (TYLENOL) 500 MG tablet, Take by mouth., Disp: , Rfl:  .  albuterol (PROVENTIL) (2.5 MG/3ML) 0.083% nebulizer solution, Inhale into the lungs., Disp: , Rfl:  .  asenapine (SAPHRIS) 5 MG SUBL 24 hr tablet, Place 1 tablet (5 mg total) 2 (two) times daily under the tongue., Disp: 60 tablet, Rfl: 0 .  atenolol (TENORMIN) 50 MG tablet, Take 1 tablet (50 mg total) daily by mouth., Disp: 30 tablet, Rfl: 0 .  atorvastatin (LIPITOR) 10 MG tablet, Take 10 mg by mouth daily., Disp: , Rfl:  .  cloZAPine (CLOZARIL) 100 MG tablet, Take 100 mg by mouth daily., Disp: , Rfl:  .  colchicine 0.6 MG tablet, Take 1 tablet (0.6 mg total) by mouth 3 (three) times daily for 5 days., Disp: 15 tablet, Rfl: 0 .  divalproex (DEPAKOTE ER) 500 MG 24 hr tablet, Take 2,000 mg by mouth at bedtime. , Disp: , Rfl:  .  furosemide (LASIX) 20 MG tablet, TAKE 3 TABLETS (60MG ) BY MOUTH ONCE DAILY, Disp: 90 tablet, Rfl: 10 .  glipiZIDE (GLUCOTROL) 5 MG tablet, Take 0.5 tablets (2.5 mg total) daily before breakfast by mouth., Disp: 30  tablet, Rfl: 0 .  haloperidol (HALDOL) 5 MG tablet, Take 5 mg by mouth 2 (two) times daily. , Disp: , Rfl:  .  ibuprofen (ADVIL,MOTRIN) 600 MG tablet, Take 1 tablet (600 mg total) by mouth every 6 (six) hours as needed (foot pain)., Disp: 12 tablet, Rfl: 0 .  indomethacin (INDOCIN) 50 MG capsule, Take 1 capsule (50 mg total) by mouth 3 (three) times daily as needed for up to 5 days., Disp: 15 capsule, Rfl: 0 .  JANUVIA 100 MG tablet, TAKE 1 TABLET BY MOUTH ONCE DAILY, Disp: 30 tablet, Rfl: 10 .  levothyroxine (SYNTHROID, LEVOTHROID) 75 MCG tablet, Take 75 mcg by mouth daily., Disp: , Rfl:  .  loratadine (CLARITIN) 10 MG tablet, Take 10 mg by mouth daily., Disp: , Rfl:  .  losartan (COZAAR) 50 MG tablet, Take 50 mg by mouth daily., Disp: , Rfl:  .  metFORMIN (GLUCOPHAGE) 1000 MG tablet, Take 1,000 mg by mouth daily., Disp: , Rfl:  .  metFORMIN (GLUCOPHAGE) 500 MG tablet, Take 1 tablet (500 mg total) by mouth 2 (two) times daily with a meal., Disp: 60 tablet, Rfl: 0 .  omeprazole (PRILOSEC) 20 MG capsule, Take 1 capsule (20 mg total) by mouth daily., Disp: 90 capsule, Rfl: 1 .  oxybutynin (DITROPAN) 5 MG tablet, TAKE 1 TABLET BY MOUTH ONCE DAILY, Disp: 30 tablet, Rfl: 10 .  Semaglutide,0.25 or 0.5MG /DOS, (OZEMPIC, 0.25 OR 0.5 MG/DOSE,) 2 MG/1.5ML SOPN, Inject 0.375 mLs (0.5 mg total) into the skin once a week., Disp: 1.5 mL, Rfl: 4 .  TRUE METRIX BLOOD GLUCOSE TEST test strip, FINGERSTICK BLOOD SUGAR ONCE DAILY, Disp: 50 each, Rfl: 10   No Known Allergies  ROS Review of Systems  Constitutional: Negative.  Negative for diaphoresis and fever.  HENT: Negative.  Negative for congestion and sore throat.   Eyes: Negative.   Respiratory: Negative.  Negative for cough and shortness of breath.   Cardiovascular: Positive for leg swelling. Negative for chest pain and palpitations.  Gastrointestinal: Positive for diarrhea (uncontrolled 1x today). Negative for constipation, nausea and vomiting.  Endocrine:  Negative.   Genitourinary: Negative.  Negative for frequency and urgency.  Musculoskeletal: Positive for joint swelling. Negative for back pain and myalgias.  Skin: Negative.  Negative for rash.  Allergic/Immunologic: Negative.   Neurological: Positive for weakness. Negative for dizziness and headaches.  Hematological: Negative.   Psychiatric/Behavioral: Negative.  The patient is not nervous/anxious.   All other systems reviewed and are negative.    OBJECTIVE:    Physical Exam Vitals reviewed.  Constitutional:      Appearance: Normal appearance.  HENT:     Mouth/Throat:     Mouth: Mucous membranes are moist.  Eyes:     Pupils: Pupils are equal, round, and reactive to light.  Neck:     Vascular: No carotid bruit.  Cardiovascular:     Rate and Rhythm: Normal rate and regular rhythm.     Pulses: Normal pulses.     Heart sounds: Normal heart sounds.  Pulmonary:     Effort: Pulmonary effort is normal.     Breath sounds: Normal breath sounds.  Abdominal:     General: Bowel sounds are normal.     Palpations: Abdomen is soft. There is no hepatomegaly, splenomegaly or mass.     Tenderness: There is no abdominal tenderness.     Hernia: No hernia is present.  Musculoskeletal:     Cervical back: Neck supple.     Right lower leg: 2+ Edema present.     Left lower leg: 2+ Edema present.  Skin:    Findings: No rash.  Neurological:     Mental Status: He is alert and oriented to person, place, and time.     Motor: Weakness (bilateral legs) present.  Psychiatric:        Mood and Affect: Mood normal.        Behavior: Behavior normal.     BP 124/76   Pulse (!) 113   Temp (!) 96.5 F (35.8 C)   Ht 6\' 1"  (1.854 m)   Wt 276 lb (125.2 kg)   BMI 36.41 kg/m  Wt Readings from Last 3 Encounters:  08/17/19 276 lb (125.2 kg)  08/10/19 (!) 301 lb 14.4 oz (136.9 kg)  08/09/19 (!) 303 lb 3.2 oz (137.5 kg)    Health Maintenance Due  Topic Date Due  . Hepatitis C Screening  Never done    . FOOT EXAM  Never done  . OPHTHALMOLOGY EXAM  Never done  . COVID-19 Vaccine (1) Never done  . TETANUS/TDAP  Never done  . HEMOGLOBIN A1C  05/01/2017  . INFLUENZA VACCINE  08/02/2019    There are no preventive care reminders to display for this patient.  CBC Latest Ref Rng & Units 08/12/2019 07/01/2019 05/23/2019  WBC 4.0 - 10.5 K/uL 7.3 7.2 6.2  Hemoglobin 13.0 - 17.0 g/dL 12.6(L) 12.6(L) 12.3(L)  Hematocrit 39 - 52 % 40.0 38.8(L) 38.8(L)  Platelets 150 - 400 K/uL 220 207 157   CMP Latest Ref Rng & Units 08/12/2019 07/01/2019 05/23/2019  Glucose 70 - 99 mg/dL 203(H) 232(H) 313(H)  BUN 6 - 20 mg/dL 11 14 19   Creatinine 0.61 - 1.24 mg/dL 1.09 1.08 1.34(H)  Sodium 135 - 145 mmol/L 137 136 132(L)  Potassium 3.5 - 5.1 mmol/L 3.9 4.3 4.6  Chloride 98 - 111 mmol/L 95(L) 95(L) 97(L)  CO2 22 - 32 mmol/L 30 31 27   Calcium 8.9 - 10.3 mg/dL 9.1 9.2 8.6(L)  Total Protein 6.5 - 8.1 g/dL 9.2(H) - -  Total Bilirubin 0.3 - 1.2 mg/dL 0.8 - -  Alkaline Phos 38 - 126 U/L 49 - -  AST 15 - 41 U/L 13(L) - -  ALT 0 - 44 U/L 15 - -    Lab Results  Component Value Date   TSH 1.842 11/01/2016   Lab Results  Component Value  Date   ALBUMIN 3.9 08/12/2019   ANIONGAP 12 08/12/2019   Lab Results  Component Value Date   CHOL 146 11/24/2013   HDL 22 (L) 11/24/2013   LDLCALC 94 11/24/2013   Lab Results  Component Value Date   TRIG 152 11/24/2013   Lab Results  Component Value Date   HGBA1C 5.7 (H) 11/01/2016   HGBA1C 5.6 04/28/2016   HGBA1C 7.2 (H) 11/23/2013      ASSESSMENT & PLAN:   Problem List Items Addressed This Visit      Cardiovascular and Mediastinum   Essential hypertension (Chronic)    BP is under control on losartin        Respiratory   OSA (obstructive sleep apnea)     Endocrine   Diabetes mellitus ([place])    Blood sugar is being controlled with Ozempic.         Nervous and Auditory   Bilateral leg weakness - Primary    Pt has weakness of the both legs. He has  trouble getting up from a sitting position to a standing position. Sensation of both legs are intact. Pedal pulses are 2+. Motor power is reduced in both legs. There is no evidence of DVT. Pt ws able to walk to the bathroom for BM. And was able to come back to exam room by himself. He is using a walker to walk. I will send him to physical therapy. He will return back in 10 days for a recheck. If he gets worse he can go to the hospital. It was advised to the caretaker that he should go to a nursing home where more facilities are available to help him.         Musculoskeletal and Integument   Knee arthropathy    Knee arthropathy is better. Left knee is less swollen. There is no redness or tenderness on any of the knees. There is no tenderness of the calf. Pedal pulses 1+. 1+ pedal edema bilaterally. There is no evidence of acute goute on both lower extremities.         Other   Somnolence    Pt has sleep apnea and he does not use the CPAP machine as instructed. He feels sleepy during the day         No orders of the defined types were placed in this encounter.   Follow-up: No follow-ups on file.    Dr. Jane Canary [facility] [address], [place], [place] [address]   By signing my name below, I, [employer], attest that this documentation has been prepared under the direction and in the presence of Cletis Athens, MD. Electronically Signed: Cletis Athens, MD 08/17/19, 12:33 PM   I personally performed the services described in this documentation, which was SCRIBED in my presence. The recorded information has been reviewed and considered accurate. It has been edited as necessary during review. Cletis Athens, MD

## 2019-08-17 NOTE — Progress Notes (Signed)
John [name]. is a 44 y.o. male here for a f/u on DM. Pt said he is not having new sx

## 2019-08-17 NOTE — Patient Instructions (Signed)
Please get up and walk around once every four hours for as long as you can tolerate it, even if it is just across the room and back. This will help build up your strength.    Exercises To Do While Sitting   Exercises that you do while sitting (chair exercises) can give you many of the same benefits as full exercise. Benefits include strengthening your heart, burning calories, and keeping muscles and joints healthy. Exercise can also improve your mood and help with depression and anxiety. You may benefit from chair exercises if you are unable to do standing exercises because of:  Diabetic foot pain.  Obesity.  Illness.  Arthritis.  Recovery from surgery or injury.  Breathing problems.  Balance problems.  Another type of disability.  Before starting chair exercises, check with your health care provider or a physical therapist to find out how much exercise you can tolerate and which exercises are safe for you. If your health care provider approves:  Start out slowly and build up over time. Aim to work up to about 10-20 minutes for each exercise session.  Make exercise part of your daily routine.  Drink water when you exercise. Do not wait until you are thirsty. Drink every 10-15 minutes.  Stop exercising right away if you have pain, nausea, shortness of breath, or dizziness.  If you are exercising in a wheelchair, make sure to lock the wheels.  Ask your health care provider whether you can do tai chi or yoga. Many positions in these mind-body exercises can be modified to do while seated.  Warm-up  Before starting other exercises: 1. Sit up as straight as you can. Have your knees bent at 90 degrees, which is the shape of the capital letter "L." Keep your feet flat on the floor. 2. Sit at the front edge of your chair, if you can. 3. Pull in (tighten) the muscles in your abdomen and stretch your spine and neck as straight as you can. Hold this position for a few  minutes. 4. Breathe in and out evenly. Try to concentrate on your breathing, and relax your mind.  Stretching Exercise A: Arm stretch 1. Hold your arms out straight in front of your body. 2. Bend your hands at the wrist with your fingers pointing up, as if signaling someone to stop. Notice the slight tension in your forearms as you hold the position. 3. Keeping your arms out and your hands bent, rotate your hands outward as far as you can and hold this stretch. Aim to have your thumbs pointing up and your pinkie fingers pointing down. Slowly repeat arm stretches for one minute as tolerated.  Exercise B: Leg stretch 1. If you can move your legs, try to "draw" letters on the floor with the toes of your foot. Write your name with one foot. 2. Write your name with the toes of your other foot. Slowly repeat the movements for one minute as tolerated.  Exercise C: Reach for the sky 1. Reach your hands as far over your head as you can to stretch your spine. 2. Move your hands and arms as if you are climbing a rope. Slowly repeat the movements for one minute as tolerated.  Range of motion exercises Exercise A: Shoulder roll 1. Let your arms hang loosely at your sides. 2. Lift just your shoulders up toward your ears, then let them relax back down. 3. When your shoulders feel loose, rotate your shoulders in backward and forward circles. Do  shoulder rolls slowly for one minute as tolerated.  Exercise B: March in place 1. As if you are marching, pump your arms and lift your legs up and down. Lift your knees as high as you can. ? If you are unable to lift your knees, just pump your arms and move your ankles and feet up and down. March in place for one minute as tolerated.  Exercise C: Seated jumping jacks 1. Let your arms hang down straight. 2. Keeping your arms straight, lift them up over your head. Aim to point your fingers to the ceiling. 3. While you lift your arms, straighten your legs and  slide your heels along the floor to your sides, as wide as you can. 4. As you bring your arms back down to your sides, slide your legs back together. ? If you are unable to use your legs, just move your arms. Slowly repeat seated jumping jacks for one minute as tolerated.  Strengthening exercises Exercise A: Shoulder squeeze 1. Hold your arms straight out from your body to your sides, with your elbows bent and your fists pointed at the ceiling. 2. Keeping your arms in the bent position, move them forward so your elbows and forearms meet in front of your face. 3. Open your arms back out as wide as you can with your elbows still bent, until you feel your shoulder blades squeezing together. Hold for 5 seconds. Slowly repeat the movements forward and backward for one minute as tolerated.  Contact a health care provider if you: 1. Had to stop exercising due to any of the following: ? Pain. ? Nausea. ? Shortness of breath. ? Dizziness. ? Fatigue. 2. Have significant pain or soreness after exercising.  Get help right away if you have:  Chest pain.  Difficulty breathing.  These symptoms may represent a serious problem that is an emergency. Do not wait to see if the symptoms will go away. Get medical help right away. Call your local emergency services (911 in the [place] Do not drive yourself to the hospital.  This information is not intended to replace advice given to you by your health care provider. Make sure you discuss any questions you have with your health care provider.  Document Revised: 04/10/2018 Document Reviewed: 10/31/2016 Elsevier Patient Education  2020 [employer].

## 2019-08-17 NOTE — Assessment & Plan Note (Signed)
Pt has sleep apnea and he does not use the CPAP machine as instructed. He feels sleepy during the day

## 2019-08-17 NOTE — Assessment & Plan Note (Signed)
Knee arthropathy is better. Left knee is less swollen. There is no redness or tenderness on any of the knees. There is no tenderness of the calf. Pedal pulses 1+. 1+ pedal edema bilaterally. There is no evidence of acute goute on both lower extremities.

## 2019-08-19 ENCOUNTER — Other Ambulatory Visit: Payer: Self-pay | Admitting: Internal Medicine

## 2019-08-19 NOTE — ED Provider Notes (Signed)
[facility] Emergency Department Provider Note  ____________________________________________   First MD Initiated Contact with Patient 08/13/19 [phone]     (approximate)  I have reviewed the triage vital signs and the nursing notes.   HISTORY  Chief Complaint Leg Pain   HPI John [name]. is a 44 y.o. male with history of gout diabetes mellitus paranoid schizophrenia thyroid disease presents to the emergency department secondary to nontraumatic left knee and foot pain which patient states is consistent with previous episodes of gout.  Patient denies any fever.  Patient denies any chest pain no dyspnea.  No previous history of DVT or PE.  Patient states that current pain score is 8 out of 10.       Past Medical History:  Diagnosis Date  . Anemia   . Asthma   . Diabetes mellitus   . 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 Morbid obesity ([place])   . Paranoid schizophrenia ([place])   . Personality disorder ([place])   . Sleep apnea   . Thyroid disease     Patient Active Problem List   Diagnosis Date Noted  . Bilateral leg weakness 08/17/2019  . Knee arthropathy 08/10/2019  . Acute gout due to renal impairment involving right foot 07/08/2019  . Encephalopathy acute   . Somnolence 11/01/2016  . AKI (acute kidney injury) ([place]) 11/01/2016  . Sepsis ([place]) 04/27/2016  . Respiratory failure ([place]) 12/20/2015  . Cardiomyopathy ([place]) 11/28/2015  . Tobacco abuse 11/28/2015  . Acute respiratory failure with hypercapnia ([place])   . OSA (obstructive sleep apnea)   . Essential hypertension 11/25/2015  . Hypothyroidism 11/25/2015  . Paranoid schizophrenia, chronic condition ([place]) 11/25/2015  . Acute respiratory failure ([place])   . Acute exacerbation of COPD with asthma ([place])   . Diabetes mellitus ([place])   . Diabetes mellitus due to underlying condition without complication, without long-term current use of insulin ([place])   . Morbid obesity  ([place]) 09/29/2008    Past Surgical History:  Procedure Laterality Date  . FRACTURE SURGERY      Prior to Admission medications   Medication Sig Start Date End Date Taking? Authorizing Provider  acetaminophen (TYLENOL) 500 MG tablet Take by mouth.    [provider]  albuterol (PROVENTIL) (2.5 MG/3ML) 0.083% nebulizer solution Inhale into the lungs.    [provider]  asenapine (SAPHRIS) 5 MG SUBL 24 hr tablet Place 1 tablet (5 mg total) 2 (two) times daily under the tongue. 11/13/16   Bettey Costa, MD  atenolol (TENORMIN) 50 MG tablet Take 1 tablet (50 mg total) daily by mouth. 11/14/16   Bettey Costa, MD  atorvastatin (LIPITOR) 10 MG tablet Take 10 mg by mouth daily.    [provider]  cloZAPine (CLOZARIL) 100 MG tablet Take 100 mg by mouth daily.    [provider]  colchicine 0.6 MG tablet Take 1 tablet (0.6 mg total) by mouth 3 (three) times daily for 5 days. 08/13/19 08/18/19  Cletis Athens, MD  divalproex (DEPAKOTE ER) 500 MG 24 hr tablet Take 2,000 mg by mouth at bedtime.     [provider]  furosemide (LASIX) 20 MG tablet TAKE 3 TABLETS (60MG ) BY MOUTH ONCE DAILY 06/11/19   Cletis Athens, MD  glipiZIDE (GLUCOTROL) 5 MG tablet Take 0.5 tablets (2.5 mg total) daily before breakfast by mouth. 11/13/16   Bettey Costa, MD  haloperidol (HALDOL) 5 MG tablet Take 5 mg by mouth 2 (two) times  daily.     [provider]  ibuprofen (ADVIL,MOTRIN) 600 MG tablet Take 1 tablet (600 mg total) by mouth every 6 (six) hours as needed (foot pain). 01/04/17   Schaevitz, Randall An, MD  JANUVIA 100 MG tablet TAKE 1 TABLET BY MOUTH ONCE DAILY 06/11/19   Cletis Athens, MD  levothyroxine (SYNTHROID, LEVOTHROID) 75 MCG tablet Take 75 mcg by mouth daily.    [provider]  loratadine (CLARITIN) 10 MG tablet Take 10 mg by mouth daily.    [provider]  losartan (COZAAR) 50 MG tablet Take 50 mg by mouth daily.    [provider]    metFORMIN (GLUCOPHAGE) 1000 MG tablet Take 1,000 mg by mouth daily. 06/05/19   [provider]  metFORMIN (GLUCOPHAGE) 500 MG tablet Take 1 tablet (500 mg total) by mouth 2 (two) times daily with a meal. 07/07/17 08/06/17  Arta Silence, MD  omeprazole (PRILOSEC) 20 MG capsule Take 1 capsule (20 mg total) by mouth daily. 07/07/19   Cletis Athens, MD  oxybutynin (DITROPAN) 5 MG tablet TAKE 1 TABLET BY MOUTH ONCE DAILY 08/06/19   Cletis Athens, MD  Semaglutide,0.25 or 0.5MG /DOS, (OZEMPIC, 0.25 OR 0.5 MG/DOSE,) 2 MG/1.5ML SOPN Inject 0.375 mLs (0.5 mg total) into the skin once a week. 08/13/19   Cletis Athens, MD  TRUE METRIX BLOOD GLUCOSE TEST test strip FINGERSTICK BLOOD SUGAR ONCE DAILY 07/14/19   Cletis Athens, MD    Allergies Patient has no known allergies.  Family History  Problem Relation Age of Onset  . Glaucoma Mother     Social History Social History   Tobacco Use  . Smoking status: Former Research scientist (life sciences)  . Smokeless tobacco: Never Used  Vaping Use  . Vaping Use: Never used  Substance Use Topics  . Alcohol use: Not Currently    Comment: only once in a while  . Drug use: No    Review of Systems Constitutional: No fever/chills Eyes: No visual changes. ENT: No sore throat. Cardiovascular: Denies chest pain. Respiratory: Denies shortness of breath. Gastrointestinal: No abdominal pain.  No nausea, no vomiting.  No diarrhea.  No constipation. Genitourinary: Negative for dysuria. Musculoskeletal: Negative for neck pain.  Negative for back pain.  Positive for left knee and foot pain Integumentary: Negative for rash. Neurological: Negative for headaches, focal weakness or numbness.  ____________________________________________   PHYSICAL EXAM:  VITAL SIGNS: ED Triage Vitals  Enc Vitals Group     BP 08/12/19 1918 122/74     Pulse Rate 08/12/19 1918 (!) 124     Resp 08/12/19 1918 (!) 22     Temp 08/12/19 1918 99.7 F (37.6 C)     Temp Source 08/13/19 0721 Oral      SpO2 08/12/19 1918 95 %     Weight --      Height --      Head Circumference --      Peak Flow --      Pain Score 08/12/19 1917 5     Pain Loc --      Pain Edu? --      Excl. in [place]? --     Constitutional: Alert and oriented.  Eyes: Conjunctivae are normal.  Head: Atraumatic. Mouth/Throat: Patient is wearing a mask. Neck: No stridor.  No meningeal signs.   Cardiovascular: Normal rate, regular rhythm. Good peripheral circulation. Grossly normal heart sounds. Respiratory: Normal respiratory effort.  No retractions. Gastrointestinal: Soft and nontender. No distention.  Musculoskeletal: Pain with left knee and foot  palpation predominantly first metatarsal.  No overlying skin changes.  No gross deformities of extremities. Neurologic:  Normal speech and language. No gross focal neurologic deficits are appreciated.  Skin:  Skin is warm, dry and intact. Psychiatric: Mood and affect are normal. Speech and behavior are normal. ____________________________________________   LABS (all labs ordered are listed, but only abnormal results are displayed)  Labs Reviewed  COMPREHENSIVE METABOLIC PANEL - Abnormal; Notable for the following components:      Result Value   Chloride 95 (*)    Glucose, Bld 203 (*)    Total Protein 9.2 (*)    AST 13 (*)    All other components within normal limits  CBC WITH DIFFERENTIAL/PLATELET - Abnormal; Notable for the following components:   Hemoglobin 12.6 (*)    Monocytes Absolute 1.2 (*)    All other components within normal limits  URIC ACID - Abnormal; Notable for the following components:   Uric Acid, Serum 10.4 (*)    All other components within normal limits     Procedures   ____________________________________________   INITIAL IMPRESSION / MDM / ASSESSMENT AND PLAN / ED COURSE  As part of my medical decision making, I reviewed the following data within the electronic MEDICAL RECORD NUMBER   44 year old male presented above-stated history and  physical exam insistent with gout arthritis.  Patient's uric acid noted to be 10.4.  Patient given colchicine in the emergency department with improvement of his pain.  Patient will be prescribed indomethacin for home.  ____________________________________________  FINAL CLINICAL IMPRESSION(S) / ED DIAGNOSES  Final diagnoses:  Acute gout involving toe of left foot, unspecified cause     MEDICATIONS GIVEN DURING THIS VISIT:  Medications  colchicine tablet 0.6 mg (0.6 mg Oral Given 08/13/19 0722)     ED Discharge Orders         Ordered    indomethacin (INDOCIN) 50 MG capsule  3 times daily PRN     Reprint     08/13/19 2585          *Please note:  John [name]. was evaluated in Emergency Department on 08/19/2019 for the symptoms described in the history of present illness. He was evaluated in the context of the global COVID-19 pandemic, which necessitated consideration that the patient might be at risk for infection with the SARS-CoV-2 virus that causes COVID-19. Institutional protocols and algorithms that pertain to the evaluation of patients at risk for COVID-19 are in a state of rapid change based on information released by regulatory bodies including the CDC and federal and state organizations. These policies and algorithms were followed during the patient's care in the ED.  Some ED evaluations and interventions may be delayed as a result of limited staffing during and after the pandemic.*  Note:  This document was prepared using Dragon voice recognition software and may include unintentional dictation errors.   Gregor Hams, MD 08/19/19 2255

## 2019-08-21 IMAGING — CR DG CHEST 2V
2 series · 2 of 2 positions shown · non-contrast
Comparison: 07/13/2017

CLINICAL DATA: Right chest pain

EXAM:
CHEST - 2 VIEW

[chest pa]
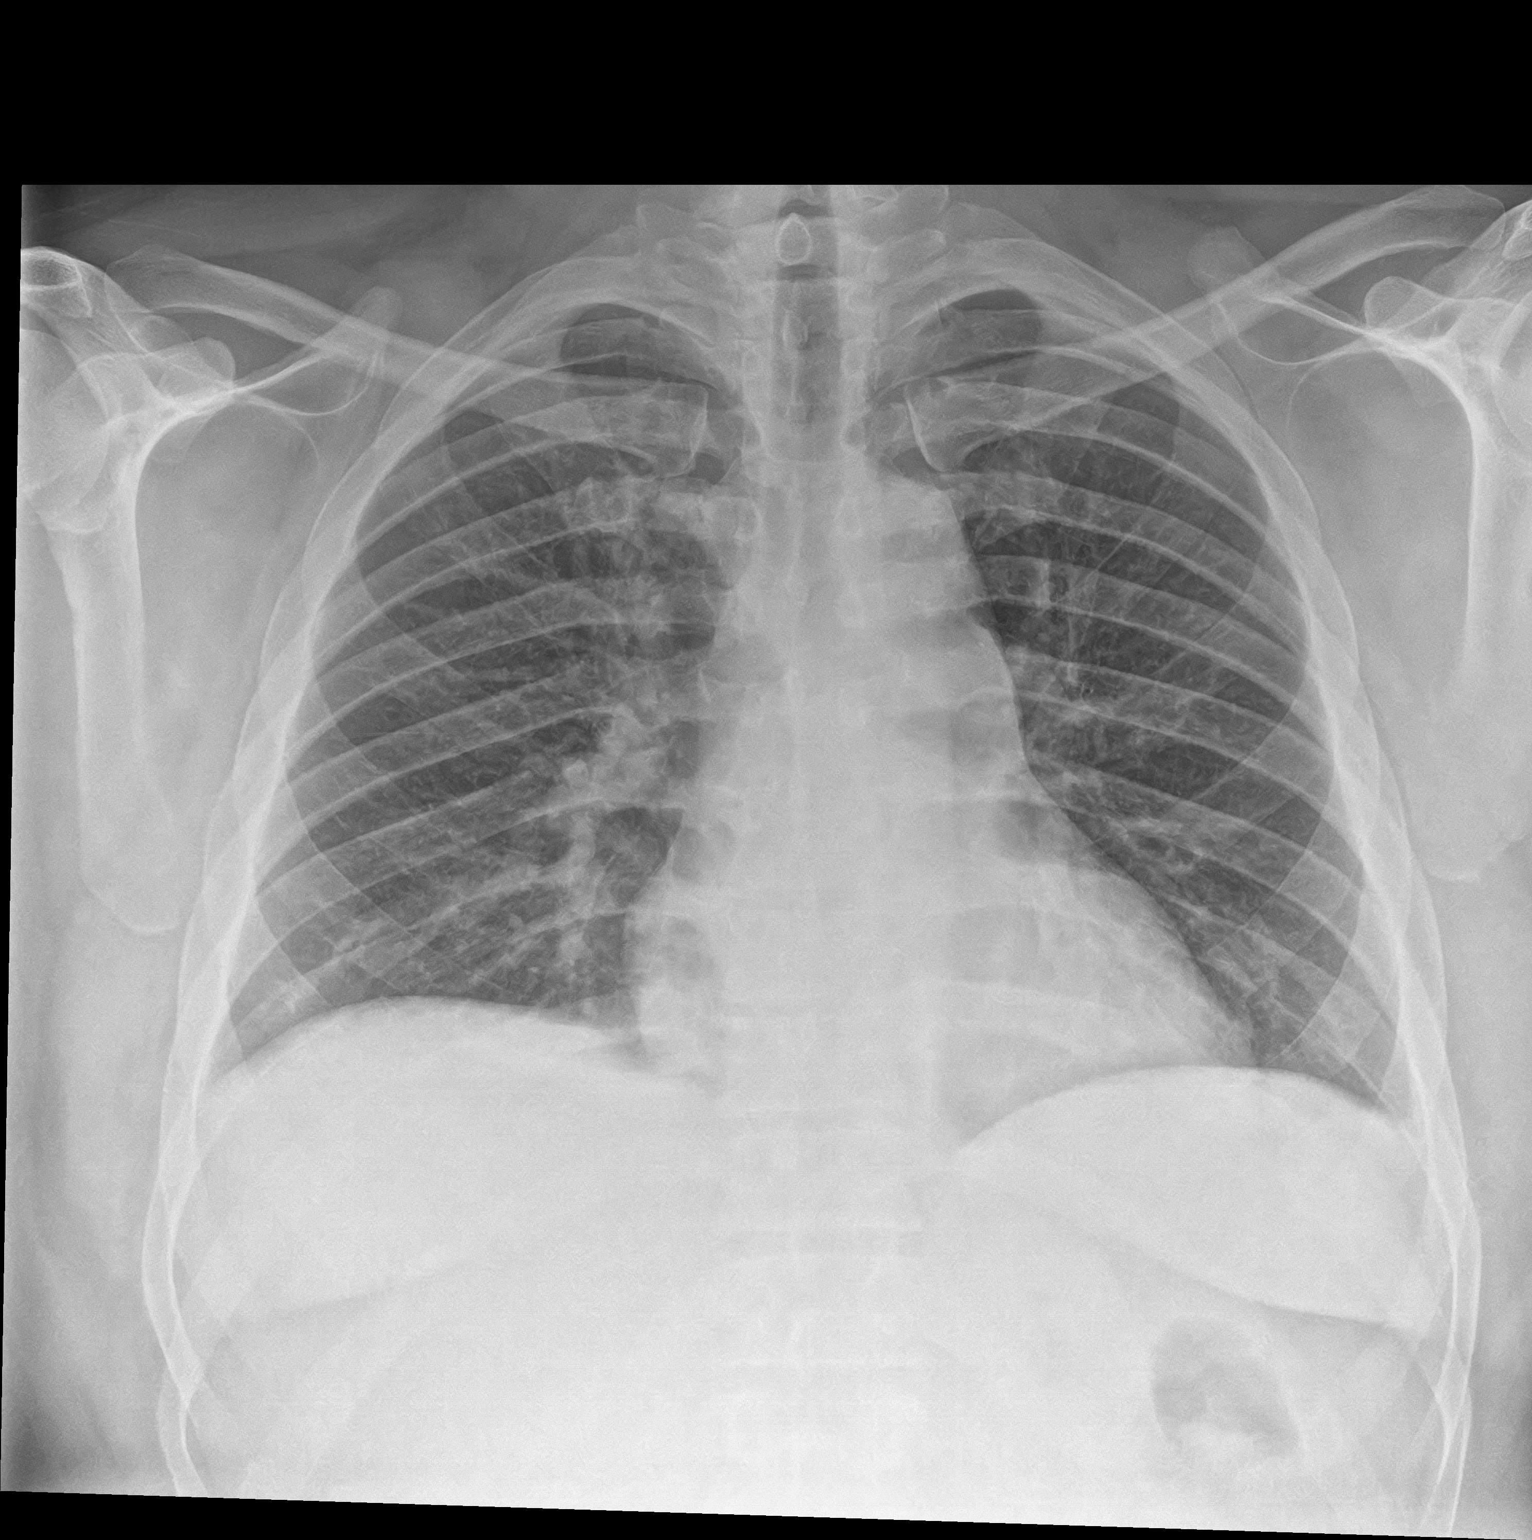

[chest lat]
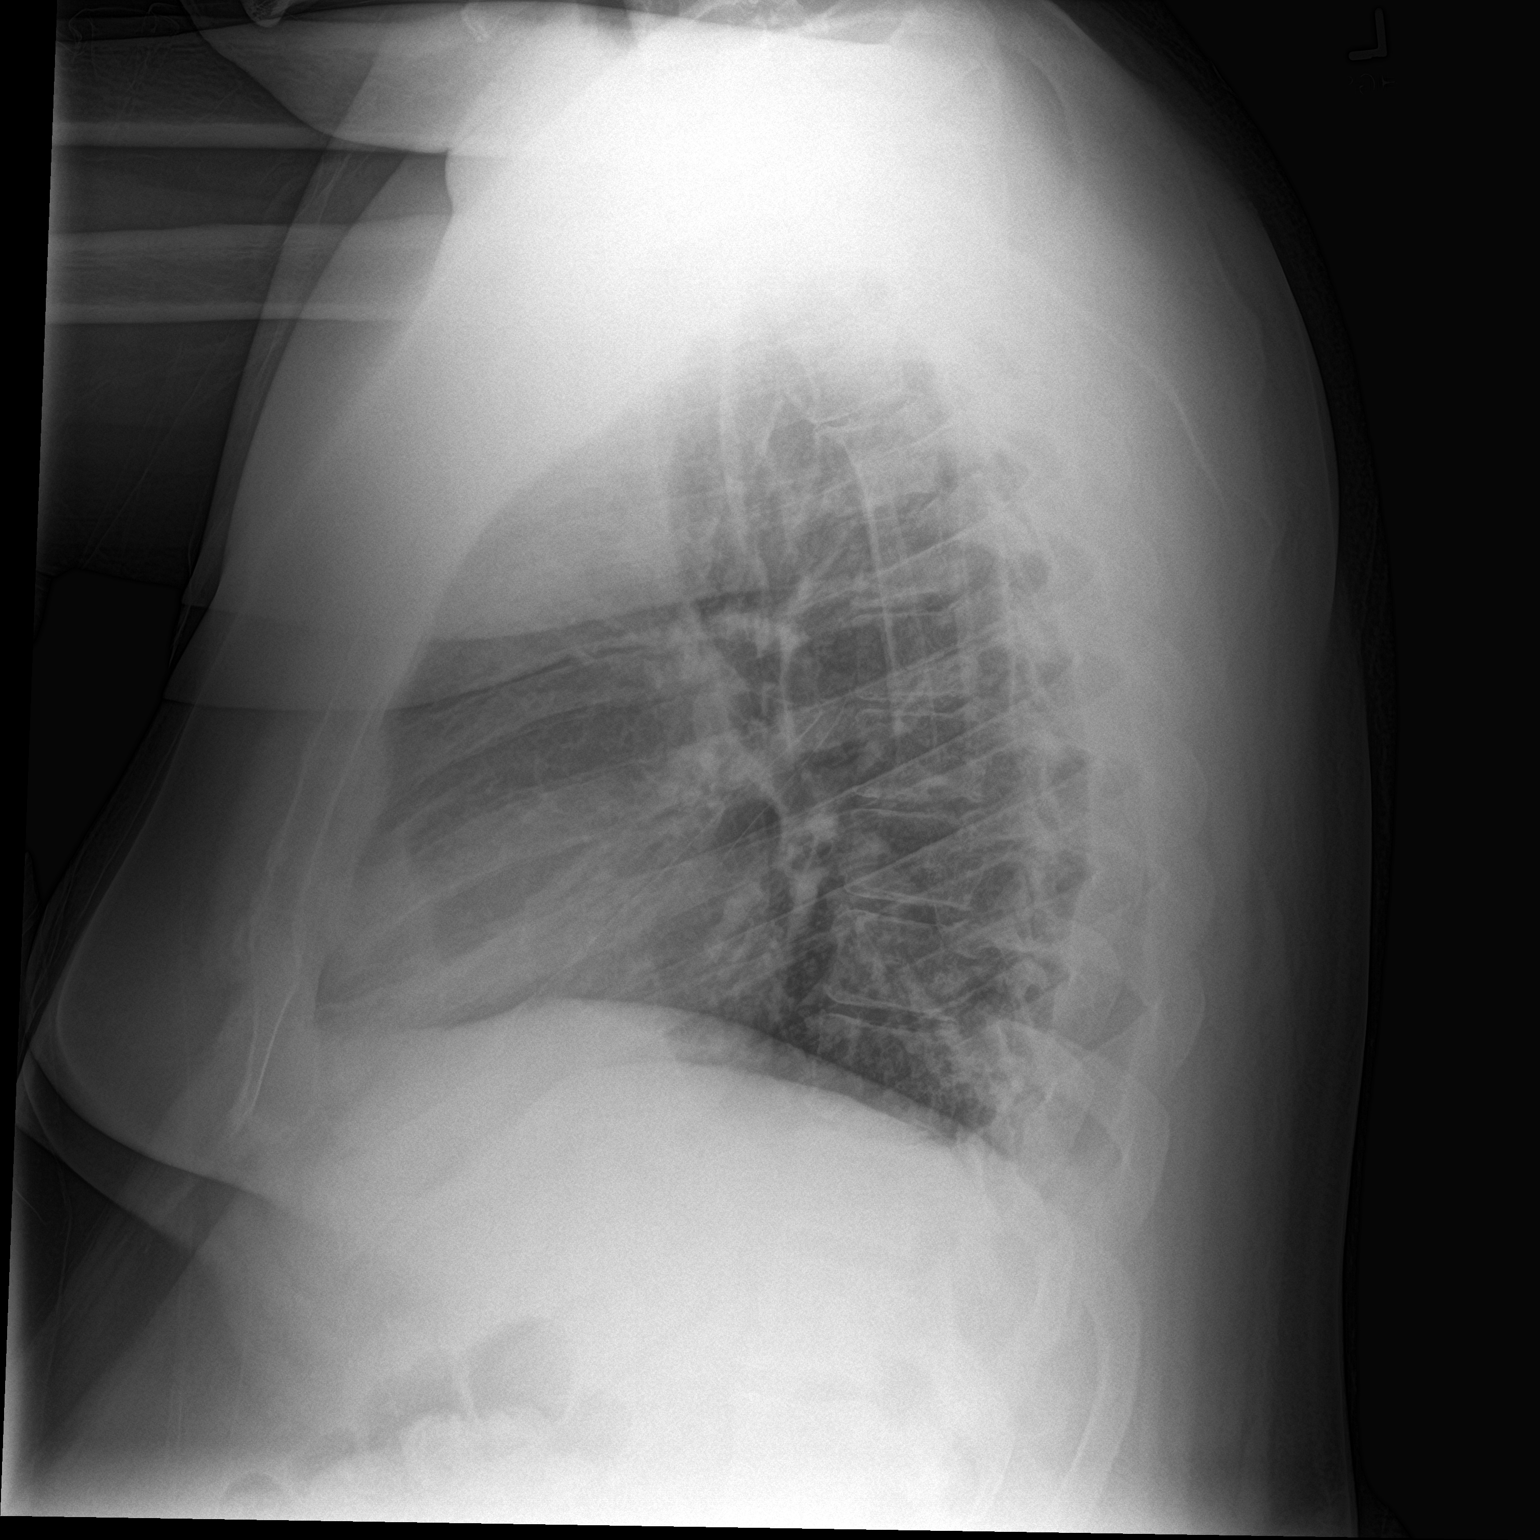

[2 of 2 positions shown; findings below may reference images not displayed]

FINDINGS: Heart and mediastinal contours are within normal limits. No focal
opacities or effusions. No acute bony abnormality. No visible rib
fracture. No pneumothorax.
IMPRESSION: No active cardiopulmonary disease.

## 2019-08-21 IMAGING — CT CT RENAL STONE PROTOCOL
2 of 4 series · 16 of 46 positions shown, 18 images · non-contrast
Comparison: Acute abdominal series November 13, 2016

CLINICAL DATA: RIGHT-sided pain for 4 days. History of
hypertension, diabetes.

EXAM:
CT ABDOMEN AND PELVIS WITHOUT CONTRAST
TECHNIQUE: Multidetector CT imaging of the abdomen and pelvis was performed
following the standard protocol without IV contrast.

[Series 2: stone full standard · axial · 0.77mm/px · z∈[-527,-52]mm · 13 of 103 slices shown, 15 images]
[im 4/103  soft-tissue]
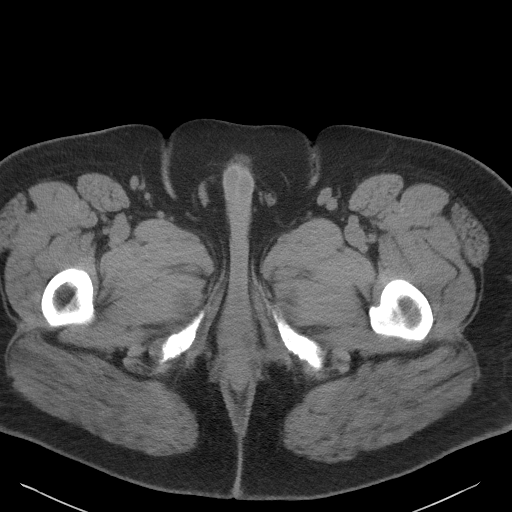
[im 4/103  bone]
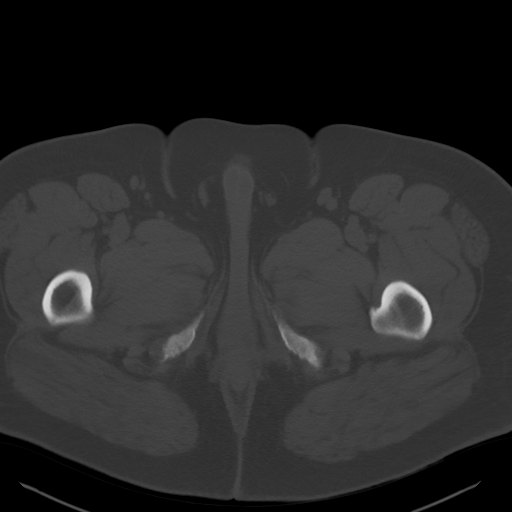
[im 12/103  soft-tissue]
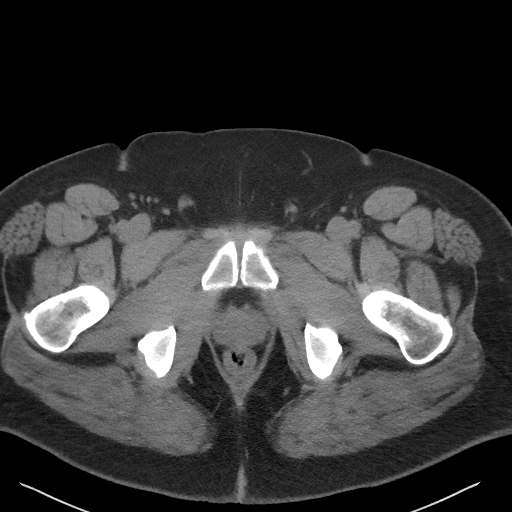
[im 20/103  soft-tissue]
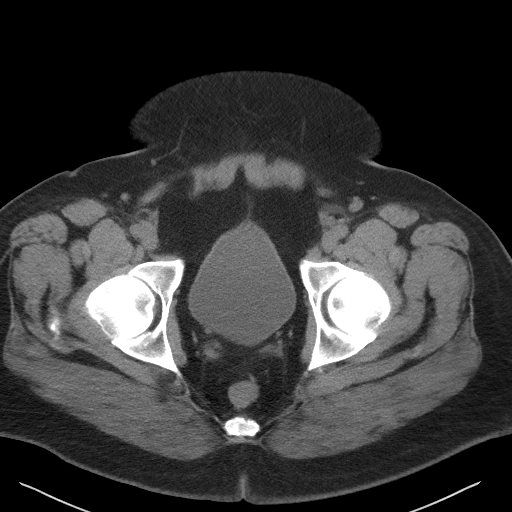
[im 28/103  soft-tissue]
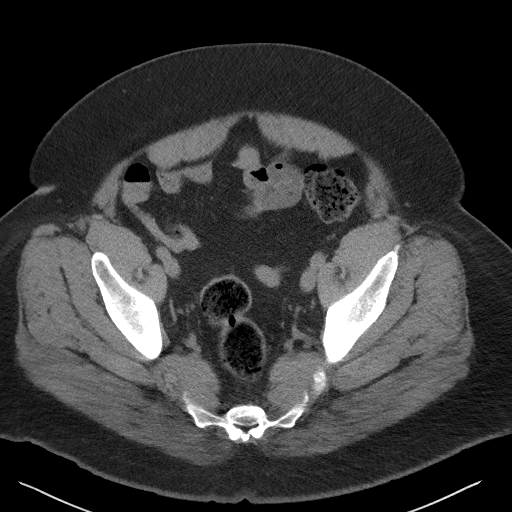
[im 36/103  soft-tissue]
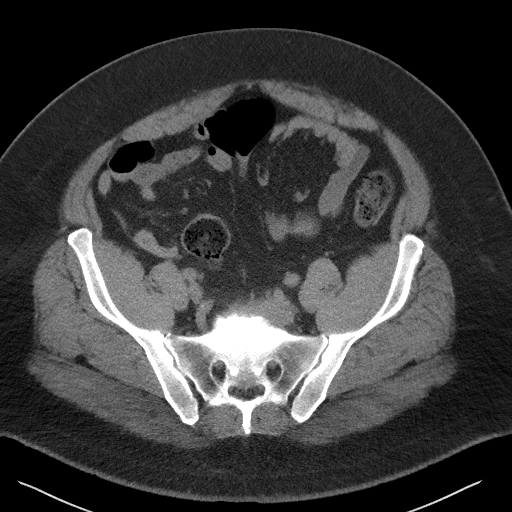
[im 44/103  soft-tissue]
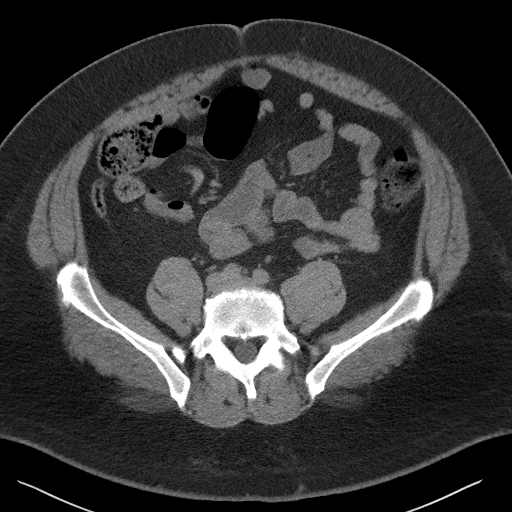
[im 52/103  soft-tissue]
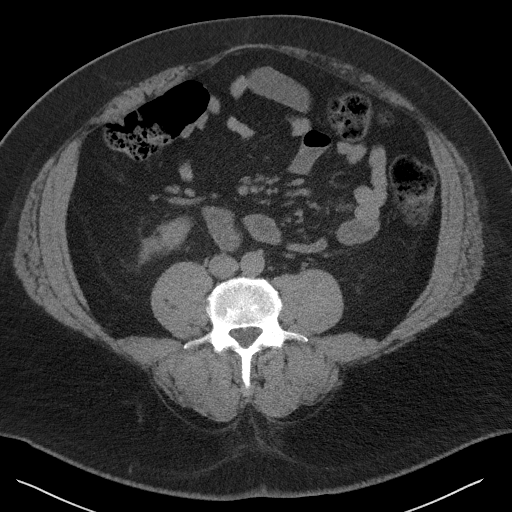
[im 59/103  soft-tissue]
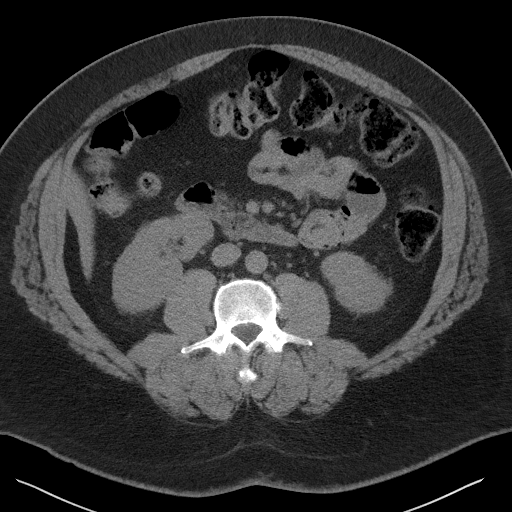
[im 67/103  soft-tissue]
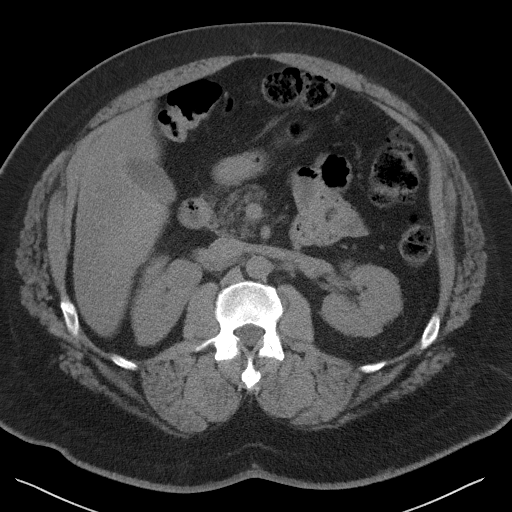
[im 67/103  bone]
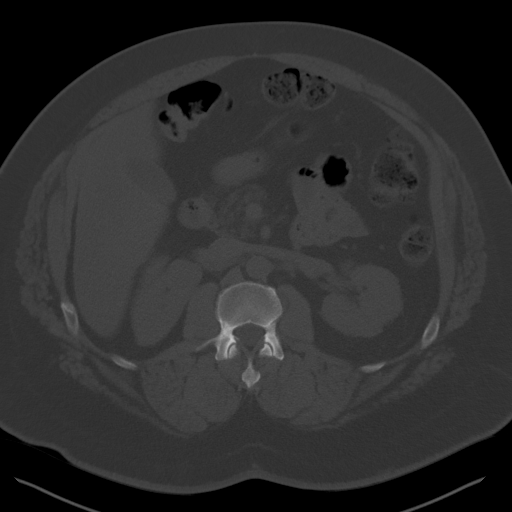
[im 75/103  soft-tissue]
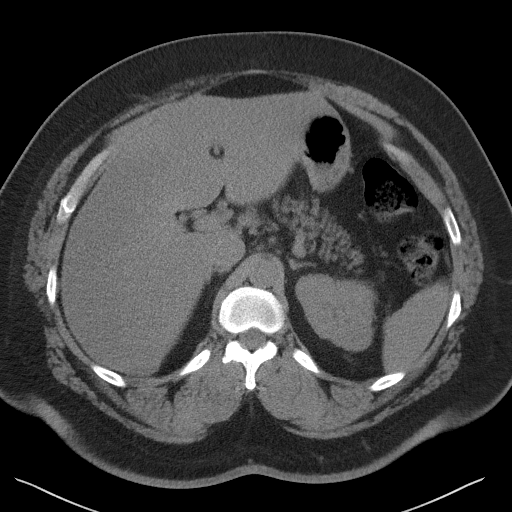
[im 83/103  soft-tissue]
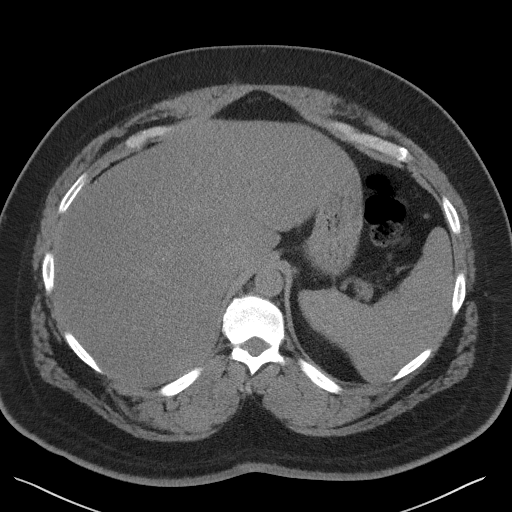
[im 91/103  soft-tissue]
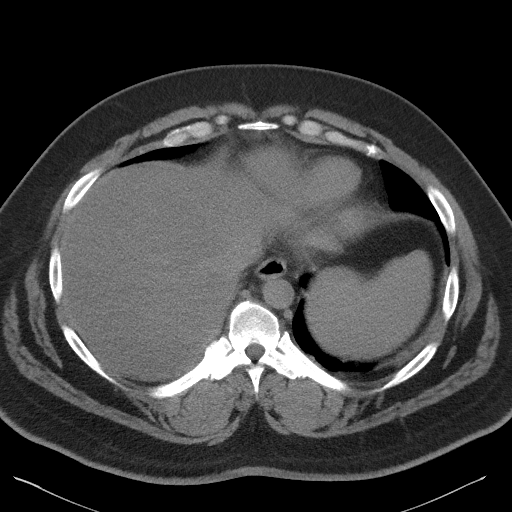
[im 99/103  soft-tissue]
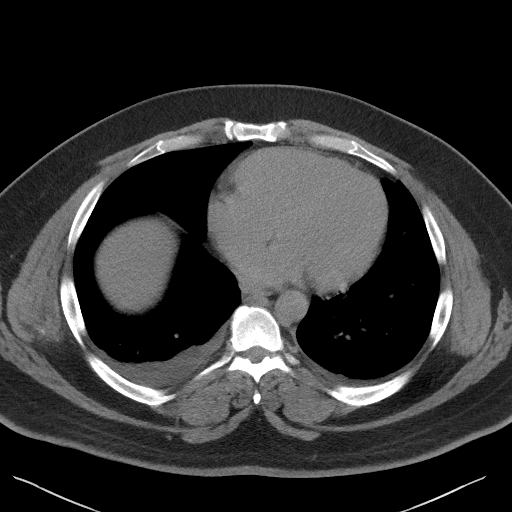

[Series 5: coronal · coronal · 0.88mm/px · 3 of 191 slices shown]
[im 64/191  soft-tissue]
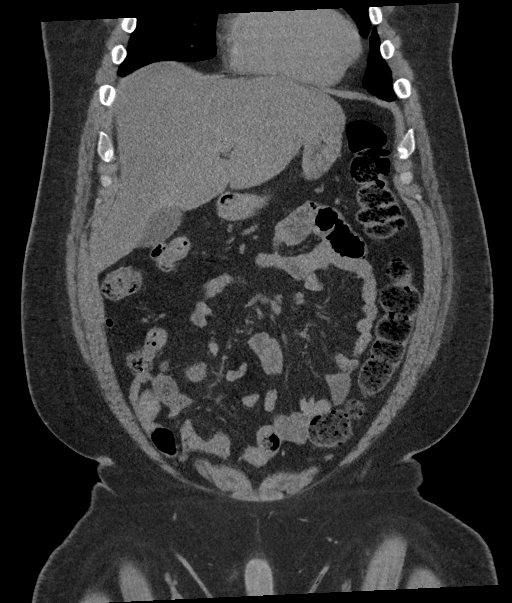
[im 85/191  soft-tissue]
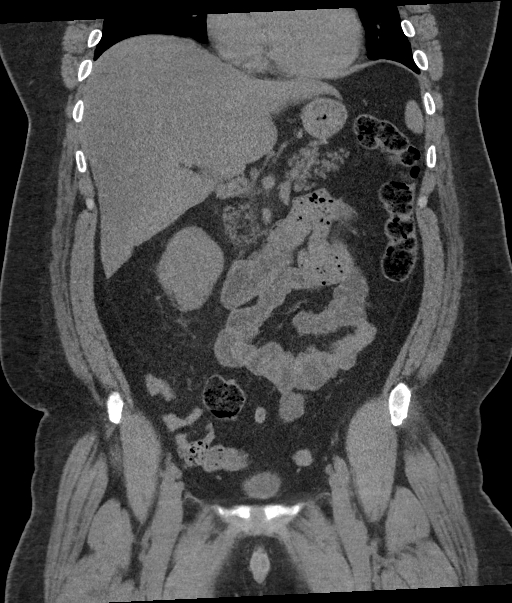
[im 106/191  soft-tissue]
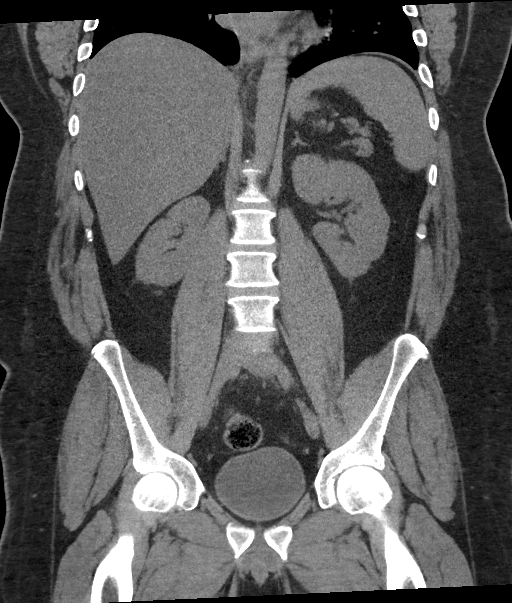

[16 of 46 positions shown; findings below may reference images not displayed]

FINDINGS: LOWER CHEST: Small RIGHT-greater-than-LEFT pleural effusions. Mild
compressive atelectasis. Included heart size is normal. No
pericardial effusion.

HEPATOBILIARY: Hepatic steatosis with focal fatty sparing about the
gallbladder fossa. Normal gallbladder.

PANCREAS: Mild fatty replacement, nonacute.

SPLEEN: Normal.

ADRENALS/URINARY TRACT: Kidneys are orthotopic, demonstrating normal
size and morphology. No nephrolithiasis, hydronephrosis; limited
assessment for renal masses by nonenhanced CT. The unopacified
ureters are normal in course and caliber. Urinary bladder is
adequately distended and unremarkable. Normal adrenal glands.

STOMACH/BOWEL: The stomach, small and large bowel are normal in
course and caliber without inflammatory changes, sensitivity
decreased by lack of enteric contrast. Moderate amount of retained
large bowel stool. Normal appendix.

VASCULAR/LYMPHATIC: Aortoiliac vessels are normal in course and
caliber, trace calcific atherosclerosis. No lymphadenopathy by CT
size criteria.

REPRODUCTIVE: Normal.

Foot

OTHER: No intraperitoneal free fluid or free air.

MUSCULOSKELETAL: Non-acute.
IMPRESSION: 1. No nephrolithiasis, hydronephrosis or acute
intra-abdominal/pelvic process.
2. Moderate amount of retained large bowel stool.
3. Hepatic steatosis.

Aortic Atherosclerosis ([id number]).

## 2019-08-26 ENCOUNTER — Ambulatory Visit: Payer: 59 | Admitting: Internal Medicine

## 2019-09-14 ENCOUNTER — Ambulatory Visit (INDEPENDENT_AMBULATORY_CARE_PROVIDER_SITE_OTHER): Payer: 59 | Admitting: Internal Medicine

## 2019-09-14 VITALS — BP 102/64 | HR 100 | Ht 73.0 in | Wt 277.0 lb

## 2019-09-14 DIAGNOSIS — G4733 Obstructive sleep apnea (adult) (pediatric): Secondary | ICD-10-CM

## 2019-09-14 DIAGNOSIS — Z9989 Dependence on other enabling machines and devices: Secondary | ICD-10-CM

## 2019-09-14 DIAGNOSIS — K219 Gastro-esophageal reflux disease without esophagitis: Secondary | ICD-10-CM | POA: Diagnosis not present

## 2019-09-14 NOTE — Patient Instructions (Signed)

## 2019-09-14 NOTE — Progress Notes (Signed)
[facility] [place], [place] [address]  Pulmonary Sleep Medicine   Office Visit Note  Patient Name: John [name]. DOB: November 14, 1975 MRN [id number]    Chief Complaint: Obstructive Sleep Apnea visit  Brief History:  Infant is seen today for follow up  The patient has a 4 year history of sleep apnea. Patient is using PAP nightly.  The patient feels more rested after sleeping with PAP.  The patient reports benefiting from PAP use. He goes to bed 8-10 p.m. and gets up 7-8 a.m. Reported sleepiness is  improved and the Epworth Sleepiness Score is 7 out of 24. The patient does take naps. The patient complains of the following: no complaints  The compliance download shows poor compliance with an average use time of 5.2 hours. The AHI is 21.1  The caregiver at his grouop home makes him put his own mask on and he cannot do this. ROS  General: (-) fever, (-) chills, (-) night sweat Nose and Sinuses: (-) nasal stuffiness or itchiness, (-) postnasal drip, (-) nosebleeds, (-) sinus trouble. Mouth and Throat: (-) sore throat, (-) hoarseness. Neck: (-) swollen glands, (-) enlarged thyroid, (-) neck pain. Respiratory: - cough, - shortness of breath, - wheezing. Neurologic: - numbness, - tingling. Psychiatric: - anxiety, - depression   Current Medication: Outpatient Encounter Medications as of 09/14/2019  Medication Sig  . acetaminophen (TYLENOL) 500 MG tablet Take by mouth.  Marland Kitchen albuterol (PROVENTIL) (2.5 MG/3ML) 0.083% nebulizer solution Inhale into the lungs.  . Alcohol Swabs (B-D SINGLE USE SWABS REGULAR) PADS USE AS DIRECTED FOR BLOOD SUGAR CHECKS.  Marland Kitchen ALLERGY RELIEF 10 MG tablet TAKE ONE TABLET BY MOUTH EVERY DAY  . asenapine (SAPHRIS) 5 MG SUBL 24 hr tablet Place 1 tablet (5 mg total) 2 (two) times daily under the tongue.  Marland Kitchen atenolol (TENORMIN) 100 MG tablet TAKE ONE TABLET BY MOUTH EVERY DAY  . atenolol (TENORMIN) 50 MG tablet Take 1 tablet (50 mg total) daily by  mouth.  Marland Kitchen atorvastatin (LIPITOR) 10 MG tablet TAKE 1 TABLET BY MOUTH EACH DAY.  Marland Kitchen BREO ELLIPTA 100-25 MCG/INH AEPB INHALE 1 PUFF BY MOUTH EVERY DAY  . cloZAPine (CLOZARIL) 100 MG tablet Take 100 mg by mouth daily.  . colchicine 0.6 MG tablet Take 1 tablet (0.6 mg total) by mouth 3 (three) times daily for 5 days.  . divalproex (DEPAKOTE ER) 500 MG 24 hr tablet Take 2,000 mg by mouth at bedtime.   . furosemide (LASIX) 20 MG tablet TAKE 3 TABLETS (60MG ) BY MOUTH ONCE DAILY  . glipiZIDE (GLUCOTROL) 5 MG tablet Take 0.5 tablets (2.5 mg total) daily before breakfast by mouth.  . haloperidol (HALDOL) 5 MG tablet Take 5 mg by mouth 2 (two) times daily.   Marland Kitchen ibuprofen (ADVIL,MOTRIN) 600 MG tablet Take 1 tablet (600 mg total) by mouth every 6 (six) hours as needed (foot pain).  Marland Kitchen JANUVIA 100 MG tablet TAKE 1 TABLET BY MOUTH ONCE DAILY  . levothyroxine (SYNTHROID) 75 MCG tablet TAKE 1 TABLET BY MOUTH EACH DAY.  Marland Kitchen losartan (COZAAR) 50 MG tablet TAKE 1 TABLET BY MOUTH EACH DAY. *IF BLOOD PRESSURE IS LESS THAN 100/70 DONT GIVE  MEDICATION*  . metFORMIN (GLUCOPHAGE) 1000 MG tablet Take 1,000 mg by mouth daily.  . metFORMIN (GLUCOPHAGE) 500 MG tablet Take 1 tablet (500 mg total) by mouth 2 (two) times daily with a meal.  . omeprazole (PRILOSEC) 20 MG capsule Take 1 capsule (20 mg total) by mouth  daily.  . oxybutynin (DITROPAN) 5 MG tablet TAKE 1 TABLET BY MOUTH ONCE DAILY  . Semaglutide,0.25 or 0.5MG /DOS, (OZEMPIC, 0.25 OR 0.5 MG/DOSE,) 2 MG/1.5ML SOPN Inject 0.375 mLs (0.5 mg total) into the skin once a week.  Suzan Nailer METRIX BLOOD GLUCOSE TEST test strip FINGERSTICK BLOOD SUGAR ONCE DAILY   No facility-administered encounter medications on file as of 09/14/2019.    Surgical History: Past Surgical History:  Procedure Laterality Date  . FRACTURE SURGERY      Medical History: Past Medical History:  Diagnosis Date  . Anemia   . Asthma   . Diabetes mellitus   . Idiopathic chronic gout of right foot  without tophus 07/20/2019  . Morbid obesity ([place])   . Paranoid schizophrenia ([place])   . Personality disorder ([place])   . Sleep apnea   . Thyroid disease     Family History: Non contributory to the present illness  Social History: Social History   Socioeconomic History  . Marital status: Single    Spouse name: Not on file  . Number of children: Not on file  . Years of education: Not on file  . Highest education level: Not on file  Occupational History  . Not on file  Tobacco Use  . Smoking status: Former Research scientist (life sciences)  . Smokeless tobacco: Never Used  Vaping Use  . Vaping Use: Never used  Substance and Sexual Activity  . Alcohol use: Not Currently    Comment: only once in a while  . Drug use: No  . Sexual activity: Not on file  Other Topics Concern  . Not on file  Social History Narrative  . Not on file   Social Determinants of Health   Financial Resource Strain:   . Difficulty of Paying Living Expenses: Not on file  Food Insecurity:   . Worried About Charity fundraiser in the Last Year: Not on file  . Ran Out of Food in the Last Year: Not on file  Transportation Needs:   . Lack of Transportation (Medical): Not on file  . Lack of Transportation (Non-Medical): Not on file  Physical Activity:   . Days of Exercise per Week: Not on file  . Minutes of Exercise per Session: Not on file  Stress:   . Feeling of Stress : Not on file  Social Connections:   . Frequency of Communication with Friends and Family: Not on file  . Frequency of Social Gatherings with Friends and Family: Not on file  . Attends Religious Services: Not on file  . Active Member of Clubs or Organizations: Not on file  . Attends Archivist Meetings: Not on file  . Marital Status: Not on file  Intimate Partner Violence:   . Fear of Current or Ex-Partner: Not on file  . Emotionally Abused: Not on file  . Physically Abused: Not on file  . Sexually Abused: Not on file    Vital Signs: Blood  pressure 102/64, pulse 100, height 6\' 1"  (1.854 m), weight 277 lb (125.6 kg), SpO2 94 %.  Examination: General Appearance: The patient is well-developed, well-nourished, and in no distress. Neck Circumference: N/A Skin: Gross inspection of skin unremarkable. Head: normocephalic, no gross deformities. Eyes: no gross deformities noted. ENT: ears appear grossly normal Neurologic: Alert and oriented. No involuntary movements.    EPWORTH SLEEPINESS SCALE:  Scale:  (0)= no chance of dozing; (1)= slight chance of dozing; (2)= moderate chance of dozing; (3)= high chance of dozing  Chance  Situtation  Sitting and reading: 0    Watching TV: 2    Sitting Inactive in public:1    As a passenger in car: 0      Lying down to rest: 3    Sitting and talking: 0    Sitting quielty after lunch: 1    In a car, stopped in traffic: 0   TOTAL SCORE:   7 out of 24    SLEEP STUDIES:  Split 11/2015 AHi 69 SpO3min 84% needs biPAP  CPAP COMPLIANCE DATA:  Date Range: 08/11/19-09/09/19  Average Daily Use: 5.2 hours  Median Use: 4.5  Compliance for > 4 Hours: 53%  AHI: 21 respiratory events per hour  Days Used: 28/30  Mask Leak: 119.9  95th Percentile Pressure: 24/17         LABS: Recent Results (from the past 2160 hour(s))  Urinalysis, Complete w Microscopic     Status: Abnormal   Collection Time: 07/01/19  5:45 PM  Result Value Ref Range   Color, Urine YELLOW (A) YELLOW   APPearance CLEAR (A) CLEAR   Specific Gravity, Urine 1.011 1.005 - 1.030   pH 5.0 5.0 - 8.0   Glucose, UA NEGATIVE NEGATIVE mg/dL   Hgb urine dipstick NEGATIVE NEGATIVE   Bilirubin Urine NEGATIVE NEGATIVE   Ketones, ur NEGATIVE NEGATIVE mg/dL   Protein, ur NEGATIVE NEGATIVE mg/dL   Nitrite NEGATIVE NEGATIVE   Leukocytes,Ua NEGATIVE NEGATIVE   WBC, UA 0-5 0 - 5 WBC/hpf   Bacteria, UA NONE SEEN NONE SEEN   Squamous Epithelial / LPF 0-5 0 - 5   Mucus PRESENT    Hyaline Casts, UA PRESENT      Comment: Performed at [facility], [address] [place], [place] [address]  Basic metabolic panel     Status: Abnormal   Collection Time: 07/01/19  5:45 PM  Result Value Ref Range   Sodium 136 135 - 145 mmol/L   Potassium 4.3 3.5 - 5.1 mmol/L   Chloride 95 (L) 98 - 111 mmol/L   CO2 31 22 - 32 mmol/L   Glucose, Bld 232 (H) 70 - 99 mg/dL    Comment: Glucose reference range applies only to samples taken after fasting for at least 8 hours.   BUN 14 6 - 20 mg/dL   Creatinine, Ser 1.08 0.61 - 1.24 mg/dL   Calcium 9.2 8.9 - 10.3 mg/dL   GFR calc non Af Amer >60 >60 mL/min   GFR calc Af Amer >60 >60 mL/min   Anion gap 10 5 - 15    Comment: Performed at [facility], [address] [place], [place] [address]  CBC     Status: Abnormal   Collection Time: 07/01/19  5:45 PM  Result Value Ref Range   WBC 7.2 4.0 - 10.5 K/uL   RBC 4.65 4.22 - 5.81 MIL/uL   Hemoglobin 12.6 (L) 13.0 - 17.0 g/dL   HCT 38.8 (L) 39 - 52 %   MCV 83.4 80.0 - 100.0 fL   MCH 27.1 26.0 - 34.0 pg   MCHC 32.5 30.0 - 36.0 g/dL   RDW 14.1 11.5 - 15.5 %   Platelets 207 150 - 400 K/uL   nRBC 0.0 0.0 - 0.2 %    Comment: Performed at [facility], [address], [place], [place] [address]  Uric acid     Status: Abnormal   Collection Time: 07/01/19  5:45 PM  Result Value Ref Range   Uric Acid, Serum 9.6 (H)  3.7 - 8.6 mg/dL    Comment: Performed at [facility], [place], [place] [address]  POCT glucose (manual entry)     Status: Abnormal   Collection Time: 07/20/19 11:14 AM  Result Value Ref Range   POC Glucose 126 (A) 70 - 99 mg/dl    Comment: Caregiver states BS was checked prior to appt this morning  Comprehensive metabolic panel     Status: Abnormal   Collection Time: 08/12/19  7:20 PM  Result Value Ref Range   Sodium 137 135 - 145 mmol/L   Potassium 3.9 3.5 - 5.1 mmol/L   Chloride 95 (L) 98 - 111 mmol/L   CO2 30 22 - 32 mmol/L   Glucose, Bld 203 (H) 70 -  99 mg/dL    Comment: Glucose reference range applies only to samples taken after fasting for at least 8 hours.   BUN 11 6 - 20 mg/dL   Creatinine, Ser 1.09 0.61 - 1.24 mg/dL   Calcium 9.1 8.9 - 10.3 mg/dL   Total Protein 9.2 (H) 6.5 - 8.1 g/dL   Albumin 3.9 3.5 - 5.0 g/dL   AST 13 (L) 15 - 41 U/L   ALT 15 0 - 44 U/L   Alkaline Phosphatase 49 38 - 126 U/L   Total Bilirubin 0.8 0.3 - 1.2 mg/dL   GFR calc non Af Amer >60 >60 mL/min   GFR calc Af Amer >60 >60 mL/min   Anion gap 12 5 - 15    Comment: Performed at [facility], [address] [place], [place] [address]  CBC with Differential     Status: Abnormal   Collection Time: 08/12/19  7:20 PM  Result Value Ref Range   WBC 7.3 4.0 - 10.5 K/uL   RBC 4.68 4.22 - 5.81 MIL/uL   Hemoglobin 12.6 (L) 13.0 - 17.0 g/dL   HCT 40.0 39 - 52 %   MCV 85.5 80.0 - 100.0 fL   MCH 26.9 26.0 - 34.0 pg   MCHC 31.5 30.0 - 36.0 g/dL   RDW 14.2 11.5 - 15.5 %   Platelets 220 150 - 400 K/uL   nRBC 0.0 0.0 - 0.2 %   Neutrophils Relative % 65 %   Neutro Abs 4.6 1.7 - 7.7 K/uL   Lymphocytes Relative 19 %   Lymphs Abs 1.4 0.7 - 4.0 K/uL   Monocytes Relative 16 %   Monocytes Absolute 1.2 (H) 0 - 1 K/uL   Eosinophils Relative 0 %   Eosinophils Absolute 0.0 0 - 0 K/uL   Basophils Relative 0 %   Basophils Absolute 0.0 0 - 0 K/uL   Immature Granulocytes 0 %   Abs Immature Granulocytes 0.03 0.00 - 0.07 K/uL    Comment: Performed at [facility], [address] [place], [place] [address]  Uric acid     Status: Abnormal   Collection Time: 08/13/19  6:44 AM  Result Value Ref Range   Uric Acid, Serum 10.4 (H) 3.7 - 8.6 mg/dL    Comment: Performed at [facility], [address] [place], [place] [address]    Radiology: No results found.   Assessment and Plan: Patient Active Problem List   Diagnosis Date Noted  . Bilateral leg weakness 08/17/2019  . Knee arthropathy 08/10/2019  . Acute gout due to renal impairment  involving right foot 07/08/2019  . Encephalopathy acute   . Somnolence 11/01/2016  . AKI (acute kidney injury) ([place]) 11/01/2016  .  Sepsis ([place]) 04/27/2016  . Respiratory failure ([place]) 12/20/2015  . Cardiomyopathy ([place]) 11/28/2015  . Tobacco abuse 11/28/2015  . Acute respiratory failure with hypercapnia ([place])   . OSA (obstructive sleep apnea)   . Essential hypertension 11/25/2015  . Hypothyroidism 11/25/2015  . Paranoid schizophrenia, chronic condition ([place]) 11/25/2015  . Acute respiratory failure ([place])   . Acute exacerbation of COPD with asthma ([place])   . Diabetes mellitus ([place])   . Diabetes mellitus due to underlying condition without complication, without long-term current use of insulin ([place])   . Morbid obesity ([place]) 09/29/2008      The patient does tolerate PAP and reports significant benefit from PAP use. The patient was reminded how to adjust the mask and we fit it today. He was advised to have the aid help with this every night.. The patient now has gout and is using a walker The compliance is poor due to removing the mask at nght for the restroom and large leak. The AHI is very elevated due to large mask leak.   1. OSA- continue nightly use. Use clips to put on and take off mask. Aide must help. 2. GERD: Continue with current therapy, reports no issues today, continue to monitor and routine follow-up with PCP.  General Counseling: I have discussed the findings of the evaluation and examination with Sonia Side.  I have also discussed any further diagnostic evaluation thatmay be needed or ordered today. Sai verbalizes understanding of the findings of todays visit. We also reviewed his medications today and discussed drug interactions and side effects including but not limited excessive drowsiness and altered mental states. We also discussed that there is always a risk not just to him but also people around him. he has been encouraged to call the office with any questions or concerns  that should arise related to todays visit.     This patient was seen by Theodoro Grist AGNP-C in Collaboration with Dr. Devona Konig as a part of collaborative care agreement.   I have personally obtained a history, examined the patient, evaluated laboratory and imaging results, formulated the assessment and plan and placed orders.   Richelle Ito Saunders Glance, PhD, FAASM  Diplomate, American Board of Sleep Medicine    Allyne Gee, MD [facility] Diplomate ABMS Pulmonary and Critical Care Medicine Sleep medicine

## 2019-09-23 ENCOUNTER — Other Ambulatory Visit: Payer: Self-pay

## 2019-09-24 ENCOUNTER — Other Ambulatory Visit: Payer: Self-pay

## 2019-09-24 MED ORDER — ACCU-CHEK SOFTCLIX LANCETS MISC: 12 refills | Status: DC

## 2019-09-24 MED ORDER — GLIPIZIDE 5 MG PO TABS
2.5000 mg | ORAL_TABLET | Freq: Every day | ORAL | 3 refills | Status: DC
Start: 2019-09-24 — End: 2019-12-08

## 2019-09-24 MED ORDER — SITAGLIPTIN PHOSPHATE 100 MG PO TABS: 100.0000 mg | ORAL_TABLET | Freq: Every day | ORAL | 3 refills | Status: DC

## 2019-09-24 MED ORDER — TRUE METRIX BLOOD GLUCOSE TEST VI STRP: ORAL_STRIP | 10 refills | Status: DC

## 2019-09-24 MED ORDER — ATENOLOL 100 MG PO TABS: 100.0000 mg | ORAL_TABLET | Freq: Every day | ORAL | 3 refills | Status: DC

## 2019-09-24 MED ORDER — METFORMIN HCL 500 MG PO TABS: 500.0000 mg | ORAL_TABLET | Freq: Two times a day (BID) | ORAL | 0 refills | Status: DC

## 2019-10-01 ENCOUNTER — Ambulatory Visit: Payer: 59 | Admitting: Internal Medicine

## 2019-10-05 ENCOUNTER — Ambulatory Visit: Payer: 59 | Admitting: Internal Medicine

## 2019-10-06 ENCOUNTER — Other Ambulatory Visit: Payer: Self-pay

## 2019-10-06 ENCOUNTER — Emergency Department: Payer: 59

## 2019-10-06 ENCOUNTER — Emergency Department
Admission: EM | Admit: 2019-10-06 | Discharge: 2019-10-06 | Disposition: A | Discharge status: Home / Self Care | Payer: 59 | Attending: Emergency Medicine | Admitting: Emergency Medicine

## 2019-10-06 DIAGNOSIS — E119 Type 2 diabetes mellitus without complications: Secondary | ICD-10-CM | POA: Insufficient documentation

## 2019-10-06 DIAGNOSIS — L539 Erythematous condition, unspecified: Secondary | ICD-10-CM | POA: Insufficient documentation

## 2019-10-06 DIAGNOSIS — E039 Hypothyroidism, unspecified: Secondary | ICD-10-CM | POA: Insufficient documentation

## 2019-10-06 DIAGNOSIS — Z87891 Personal history of nicotine dependence: Secondary | ICD-10-CM | POA: Insufficient documentation

## 2019-10-06 DIAGNOSIS — M79672 Pain in left foot: Secondary | ICD-10-CM | POA: Diagnosis not present

## 2019-10-06 DIAGNOSIS — R2242 Localized swelling, mass and lump, left lower limb: Secondary | ICD-10-CM | POA: Diagnosis present

## 2019-10-06 DIAGNOSIS — Z7984 Long term (current) use of oral hypoglycemic drugs: Secondary | ICD-10-CM | POA: Diagnosis not present

## 2019-10-06 DIAGNOSIS — Z79899 Other long term (current) drug therapy: Secondary | ICD-10-CM | POA: Insufficient documentation

## 2019-10-06 DIAGNOSIS — I10 Essential (primary) hypertension: Secondary | ICD-10-CM | POA: Insufficient documentation

## 2019-10-06 DIAGNOSIS — Z7951 Long term (current) use of inhaled steroids: Secondary | ICD-10-CM | POA: Insufficient documentation

## 2019-10-06 DIAGNOSIS — J441 Chronic obstructive pulmonary disease with (acute) exacerbation: Secondary | ICD-10-CM | POA: Insufficient documentation

## 2019-10-06 MED ORDER — INDOMETHACIN 50 MG PO CAPS: 50.0000 mg | ORAL_CAPSULE | Freq: Two times a day (BID) | ORAL | 0 refills | Status: AC

## 2019-10-06 MED ORDER — COLCHICINE 0.6 MG PO TABS: 0.6000 mg | ORAL_TABLET | Freq: Two times a day (BID) | ORAL | 2 refills | Status: DC

## 2019-10-06 MED ORDER — COLCHICINE 0.3 MG HALF TABLET
0.3000 mg | ORAL_TABLET | Freq: Once | ORAL | Status: AC
  Administered 2019-10-06: 0.3 mg via ORAL
  Filled 2019-10-06: qty 1

## 2019-10-06 MED ORDER — COLCHICINE 0.6 MG PO TABS: 0.6000 mg | ORAL_TABLET | Freq: Once | ORAL | Status: DC

## 2019-10-06 NOTE — ED Notes (Signed)
Sp[facility] with [facility]. Report given by this nurse.  D/c inst to pt.

## 2019-10-06 NOTE — ED Provider Notes (Signed)
Emergency Department Provider Note  ____________________________________________  Time seen: Approximately 6:24 PM  I have reviewed the triage vital signs and the nursing notes.   HISTORY  Chief Complaint Foot Pain and Knee Pain   Historian Patient    HPI John [name]. is a 44 y.o. male with a history of gout diabetes, presents to the emergency department with lower extremity edema and erythema on the left.  Erythema primarily involves the foot and edema primarily affects the ankle.  Patient states that he has been in a supine position more than usual with.  He denies chest pain, chest tightness or shortness of breath.  No fever or chills at home.  He states that his current symptoms feel similar to gout flares in the past.  No other alleviating measures been attempted.   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 tophus 07/20/2019   Morbid obesity ([place])    Paranoid schizophrenia ([place])    Personality disorder ([place])    Sleep apnea    Thyroid disease      Immunizations up to date:  Yes.     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 tophus 07/20/2019   Morbid obesity ([place])    Paranoid schizophrenia ([place])    Personality disorder ([place])    Sleep apnea    Thyroid disease     Patient Active Problem List   Diagnosis Date Noted   Bilateral leg weakness 08/17/2019   Knee arthropathy 08/10/2019   Acute gout due to renal impairment involving right foot 07/08/2019   Encephalopathy acute    Somnolence 11/01/2016   AKI (acute kidney injury) ([place]) 11/01/2016   Sepsis ([place]) 04/27/2016   Respiratory failure ([place]) 12/20/2015   Cardiomyopathy ([place]) 11/28/2015   Tobacco abuse 11/28/2015   Acute respiratory failure with hypercapnia ([name])    OSA (obstructive sleep apnea)     Essential hypertension 11/25/2015   Hypothyroidism 11/25/2015   Paranoid schizophrenia, chronic condition ([place]) 11/25/2015   Acute respiratory failure ([name])    Acute exacerbation of COPD with asthma ([place])    Diabetes mellitus ([place])    Diabetes mellitus due to underlying condition without complication, without long-term current use of insulin ([place])    Morbid obesity ([place]) 09/29/2008    Past Surgical History:  Procedure Laterality Date   FRACTURE SURGERY      Prior to Admission medications   Medication Sig Start Date End Date Taking? Authorizing Provider  Accu-Chek Softclix Lancets lancets Use as instructed 09/24/19   Cletis Athens, MD  acetaminophen (TYLENOL) 500 MG tablet Take by mouth.    [provider]  albuterol (PROVENTIL) (2.5 MG/3ML) 0.083% nebulizer solution Inhale into the lungs.    [provider]  Alcohol Swabs (B-D SINGLE USE SWABS REGULAR) PADS USE AS DIRECTED FOR BLOOD SUGAR CHECKS. 08/20/19   Cletis Athens, MD  ALLERGY RELIEF 10 MG tablet TAKE ONE TABLET BY MOUTH EVERY DAY 08/20/19   Cletis Athens, MD  asenapine (SAPHRIS) 5 MG SUBL 24 hr tablet Place 1 tablet (5 mg total) 2 (two) times daily under the tongue. 11/13/16   Bettey Costa, MD  atenolol (TENORMIN) 100 MG tablet Take 1 tablet (100 mg total) by mouth daily. 09/24/19   Cletis Athens, MD  atorvastatin (LIPITOR) 10  MG tablet TAKE 1 TABLET BY MOUTH EACH DAY. 08/20/19   Masoud, Viann Shove, MD  BREO ELLIPTA 100-25 MCG/INH AEPB INHALE 1 PUFF BY MOUTH EVERY DAY 08/20/19   Cletis Athens, MD  cloZAPine (CLOZARIL) 100 MG tablet Take 100 mg by mouth daily.    [provider]  colchicine 0.6 MG tablet Take 1 tablet (0.6 mg total) by mouth 2 (two) times daily for 6 days. 10/06/19 10/12/19  Lannie Fields, PA-C  divalproex (DEPAKOTE ER) 500 MG 24 hr tablet Take 2,000 mg by mouth at bedtime.     [provider]  furosemide (LASIX) 20 MG tablet TAKE 3 TABLETS (60MG ) BY MOUTH ONCE DAILY 06/11/19    Cletis Athens, MD  glipiZIDE (GLUCOTROL) 5 MG tablet Take 0.5 tablets (2.5 mg total) by mouth daily before breakfast. 09/24/19   Cletis Athens, MD  glucose blood (TRUE METRIX BLOOD GLUCOSE TEST) test strip FINGERSTICK BLOOD SUGAR ONCE DAILY 09/24/19   Cletis Athens, MD  haloperidol (HALDOL) 5 MG tablet Take 5 mg by mouth 2 (two) times daily.     [provider]  ibuprofen (ADVIL,MOTRIN) 600 MG tablet Take 1 tablet (600 mg total) by mouth every 6 (six) hours as needed (foot pain). 01/04/17   Schaevitz, Randall An, MD  indomethacin (INDOCIN) 50 MG capsule Take 1 capsule (50 mg total) by mouth 2 (two) times daily with a meal for 7 days. 10/06/19 10/13/19  Lannie Fields, PA-C  levothyroxine (SYNTHROID) 75 MCG tablet TAKE 1 TABLET BY MOUTH EACH DAY. 08/20/19   Cletis Athens, MD  losartan (COZAAR) 50 MG tablet TAKE 1 TABLET BY MOUTH EACH DAY. *IF BLOOD PRESSURE IS LESS THAN 100/70 DONT GIVE  MEDICATION* 08/20/19   Cletis Athens, MD  metFORMIN (GLUCOPHAGE) 1000 MG tablet Take 1,000 mg by mouth daily. 06/05/19   [provider]  metFORMIN (GLUCOPHAGE) 500 MG tablet Take 1 tablet (500 mg total) by mouth 2 (two) times daily with a meal. 09/24/19 10/24/19  Cletis Athens, MD  omeprazole (PRILOSEC) 20 MG capsule Take 1 capsule (20 mg total) by mouth daily. 07/07/19   Cletis Athens, MD  oxybutynin (DITROPAN) 5 MG tablet TAKE 1 TABLET BY MOUTH ONCE DAILY 08/06/19   Cletis Athens, MD  Semaglutide,0.25 or 0.5MG /DOS, (OZEMPIC, 0.25 OR 0.5 MG/DOSE,) 2 MG/1.5ML SOPN Inject 0.375 mLs (0.5 mg total) into the skin once a week. 08/13/19   Cletis Athens, MD  sitaGLIPtin (JANUVIA) 100 MG tablet Take 1 tablet (100 mg total) by mouth daily. 09/24/19   Cletis Athens, MD    Allergies Patient has no known allergies.  Family History  Problem Relation Age of Onset   Glaucoma Mother     Social History Social History   Tobacco Use   Smoking status: Former Smoker   Smokeless tobacco: Never Used  Brewing technologist Use: Never used  Substance Use Topics   Alcohol use: Not Currently    Comment: only once in a while   Drug use: No     Review of Systems  Constitutional: No fever/chills Eyes:  No discharge ENT: No upper respiratory complaints. Respiratory: no cough. No SOB/ use of accessory muscles to breath Gastrointestinal:   No nausea, no vomiting.  No diarrhea.  No constipation. Musculoskeletal: Negative for musculoskeletal pain. Skin: Patient has erythema and edema of the left foot and ankle.  ____________________________________________   PHYSICAL EXAM:  VITAL SIGNS: ED Triage Vitals  Enc Vitals Group     BP 10/06/19 1633 102/67  Pulse Rate 10/06/19 1633 100     Resp 10/06/19 1633 18     Temp 10/06/19 1633 98.6 F (37 C)     Temp Source 10/06/19 1633 Oral     SpO2 10/06/19 1633 98 %     Weight 10/06/19 1634 276 lb 14.4 oz (125.6 kg)     Height 10/06/19 1634 6\' 1"  (1.854 m)     Head Circumference --      Peak Flow --      Pain Score 10/06/19 1634 7     Pain Loc --      Pain Edu? --      Excl. in [place]? --      Constitutional: Alert and oriented. Well appearing and in no acute distress. Eyes: Conjunctivae are normal. PERRL. EOMI. Head: Atraumatic. ENT:      Nose: No congestion/rhinnorhea.      Mouth/Throat: Mucous membranes are moist.  Neck: No stridor.  No cervical spine tenderness to palpation. Cardiovascular: Normal rate, regular rhythm. Normal S1 and S2.  Good peripheral circulation. Respiratory: Normal respiratory effort without tachypnea or retractions. Lungs CTAB. Good air entry to the bases with no decreased or absent breath sounds Gastrointestinal: Bowel sounds x 4 quadrants. Soft and nontender to palpation. No guarding or rigidity. No distention. Musculoskeletal: Full range of motion to all extremities. No obvious deformities noted Neurologic:  Normal for age. No gross focal neurologic deficits are appreciated.  Skin: Patient has erythema and edema of the  left foot and ankle. Psychiatric: Mood and affect are normal for age. Speech and behavior are normal.   ____________________________________________   LABS (all labs ordered are listed, but only abnormal results are displayed)  Labs Reviewed - No data to display ____________________________________________  EKG   ____________________________________________  RADIOLOGY Unk Pinto, personally viewed and evaluated these images (plain radiographs) as part of my medical decision making, as well as reviewing the written report by the radiologist.    US Venous Img Lower Unilateral Left  Result Date: 10/06/2019 CLINICAL DATA:  Leg swelling EXAM: Left LOWER EXTREMITY VENOUS DOPPLER ULTRASOUND TECHNIQUE: Gray-scale sonography with compression, as well as color and duplex ultrasound, were performed to evaluate the deep venous system(s) from the level of the common femoral vein through the popliteal and proximal calf veins. COMPARISON:  None. FINDINGS: VENOUS Normal compressibility of the common femoral, superficial femoral, and popliteal veins, as well as the visualized calf veins. The peroneal veins are not visualized. Visualized portions of profunda femoral vein and great saphenous vein unremarkable. No filling defects to suggest DVT on grayscale or color Doppler imaging. Doppler waveforms show normal direction of venous flow, normal respiratory plasticity and response to augmentation. Limited views of the contralateral common femoral vein are unremarkable. OTHER None. Limitations: none IMPRESSION: Negative. Electronically Signed   By: Prudencio Pair M.D.   On: 10/06/2019 20:19    ____________________________________________    PROCEDURES  Procedure(s) performed:     Procedures     Medications  colchicine tablet 0.3 mg (0.3 mg Oral Given 10/06/19 2042)     ____________________________________________   INITIAL IMPRESSION / ASSESSMENT AND PLAN / ED COURSE  Pertinent labs  & imaging results that were available during my care of the patient were reviewed by me and considered in my medical decision making (see chart for details).      Assessment and Plan:  Foot pain 44 year old male presents to the emergency department with erythema of the left foot consistent with gout flares that  patient has experienced in the past.  Vital signs are reassuring at triage.  On physical exam, patient had erythema and 2+ pitting edema of the left ankle.  Patient did state that he had been in bed more lately.  Differential diagnosis includes gout versus DVT.  No evidence of thrombus formation was visualized on venous ultrasound.  Patient was discharged with colchicine and indomethacin.  Patient has tolerated these medications well in the past.  Return precautions were given to return with new or worsening symptoms.  All patient questions were answered.  ____________________________________________  FINAL CLINICAL IMPRESSION(S) / ED DIAGNOSES  Final diagnoses:  Left foot pain      NEW MEDICATIONS STARTED DURING THIS VISIT:  ED Discharge Orders         Ordered    colchicine 0.6 MG tablet  2 times daily        10/06/19 2026    indomethacin (INDOCIN) 50 MG capsule  2 times daily with meals        10/06/19 2026              This chart was dictated using voice recognition software/Dragon. Despite best efforts to proofread, errors can occur which can change the meaning. Any change was purely unintentional.     Bartholomew, Ramesh, PA-C 10/06/19 2243    Carrie Mew, MD 10/07/19 [phone]

## 2019-10-06 NOTE — ED Triage Notes (Signed)
Pt comes into the ED via EMS from group  [place] [address] with left foot pain and right knee with hx of gout and this feels the same

## 2019-10-06 NOTE — Discharge Instructions (Signed)
Take indomethacin and colchicine at home for gout.

## 2019-10-06 NOTE — ED Notes (Signed)
This RN attempted to call pt's legal guardian and mother without success

## 2019-10-06 NOTE — ED Notes (Signed)
See triage note. Pt c/o left foot pain and right knee. Pt left foot swollen. Pt has hx of gout. Pt in NAD at this time.

## 2019-10-06 NOTE — ED Notes (Addendum)
Attempted to contact pt guardian renea poteat at [phone], no answer. Also attempted to contact pt mother without success at this time

## 2019-10-06 NOTE — ED Triage Notes (Signed)
Please read first nurse note.

## 2019-10-06 NOTE — ED Notes (Addendum)
This RN attempted to call John [name]. Unabel to reach her at this time. Unable to leave voicemail due to mailbox being full

## 2019-10-12 ENCOUNTER — Other Ambulatory Visit: Payer: Self-pay | Admitting: *Deleted

## 2019-10-14 ENCOUNTER — Encounter: Payer: Self-pay | Admitting: Internal Medicine

## 2019-10-14 ENCOUNTER — Other Ambulatory Visit: Payer: Self-pay

## 2019-10-14 ENCOUNTER — Ambulatory Visit (INDEPENDENT_AMBULATORY_CARE_PROVIDER_SITE_OTHER): Payer: 59 | Admitting: Internal Medicine

## 2019-10-14 VITALS — BP 147/90 | HR 98 | Ht 67.0 in | Wt 279.3 lb

## 2019-10-14 DIAGNOSIS — Z720 Tobacco use: Secondary | ICD-10-CM | POA: Diagnosis not present

## 2019-10-14 DIAGNOSIS — R400 Somnolence: Secondary | ICD-10-CM | POA: Diagnosis not present

## 2019-10-14 DIAGNOSIS — I10 Essential (primary) hypertension: Secondary | ICD-10-CM

## 2019-10-14 DIAGNOSIS — E139 Other specified diabetes mellitus without complications: Secondary | ICD-10-CM | POA: Diagnosis not present

## 2019-10-14 DIAGNOSIS — G4734 Idiopathic sleep related nonobstructive alveolar hypoventilation: Secondary | ICD-10-CM

## 2019-10-14 MED ORDER — OZEMPIC (0.25 OR 0.5 MG/DOSE) 2 MG/1.5ML ~~LOC~~ SOPN: 0.5000 mg | PEN_INJECTOR | SUBCUTANEOUS | 10 refills | Status: DC

## 2019-10-14 MED ORDER — TRUE METRIX BLOOD GLUCOSE TEST VI STRP: ORAL_STRIP | 10 refills | Status: DC

## 2019-10-14 NOTE — Assessment & Plan Note (Signed)
-   Today, the patient's blood pressure is well managed on present regime. - The patient will continue the current treatment regimen.  - I encouraged the patient to eat a low-sodium diet to help control blood pressure. - I encouraged the patient to live an active lifestyle and complete activities that increases heart rate to 85% target heart rate at least 5 times per week for one hour.      

## 2019-10-14 NOTE — Assessment & Plan Note (Signed)
He takes his glipizide and Metformin to control the blood sugar.  He was again asked to continue to lose weight.

## 2019-10-14 NOTE — Assessment & Plan Note (Signed)
Patient has lost weight.  He says that he is using CPAP machine

## 2019-10-14 NOTE — Assessment & Plan Note (Signed)
Patient was told that he need to use the CPAP machine on a daily basis.

## 2019-10-14 NOTE — Progress Notes (Signed)
Established Patient Office Visit  SUBJECTIVE:  Subjective  Patient ID: John Weckerly., male    DOB: 1975/02/09  Age: 44 y.o. MRN: [id number]  CC:  Chief Complaint  Patient presents with  . Gout    patient states that he felt he was having a gout flare up, but is better now   . Diabetes    patient's sugar was checked at home and it was 96    HPI John [name]. is a 44 y.o. male presenting today for a diabetes check and gout.   He is taking his medications as directed and without any complication. He denies any missed doses. His blood sugar was 96 today. He states that he has been using his CPAP machine at home more frequently. He also states that his gout has resolved and that he is able to walk much better now.   He feels well overall and has no general complaints.    Past Medical History:  Diagnosis Date  . Anemia   . Asthma   . Diabetes mellitus   . 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 Morbid obesity ([place])   . Paranoid schizophrenia ([place])   . Personality disorder ([place])   . Sleep apnea   . Thyroid disease     Past Surgical History:  Procedure Laterality Date  . FRACTURE SURGERY      Family History  Problem Relation Age of Onset  . Glaucoma Mother     Social History   Socioeconomic History  . Marital status: Single    Spouse name: Not on file  . Number of children: Not on file  . Years of education: Not on file  . Highest education level: Not on file  Occupational History  . Not on file  Tobacco Use  . Smoking status: Former Research scientist (life sciences)  . Smokeless tobacco: Never Used  Vaping Use  . Vaping Use: Never used  Substance and Sexual Activity  . Alcohol use: Not Currently    Comment: only once in a while  . Drug use: No  . Sexual activity: Not on file  Other Topics Concern  . Not on file  Social History Narrative  . Not on file   Social Determinants of Health   Financial Resource Strain:   . Difficulty of Paying  Living Expenses: Not on file  Food Insecurity:   . Worried About Charity fundraiser in the Last Year: Not on file  . Ran Out of Food in the Last Year: Not on file  Transportation Needs:   . Lack of Transportation (Medical): Not on file  . Lack of Transportation (Non-Medical): Not on file  Physical Activity:   . Days of Exercise per Week: Not on file  . Minutes of Exercise per Session: Not on file  Stress:   . Feeling of Stress : Not on file  Social Connections:   . Frequency of Communication with Friends and Family: Not on file  . Frequency of Social Gatherings with Friends and Family: Not on file  . Attends Religious Services: Not on file  . Active Member of Clubs or Organizations: Not on file  . Attends Archivist Meetings: Not on file  . Marital Status: Not on file  Intimate Partner Violence:   . Fear of Current or Ex-Partner: Not on file  . Emotionally Abused: Not on file  . Physically Abused: Not on file  . Sexually Abused: Not on file  Current Outpatient Medications:  .  Accu-Chek Softclix Lancets lancets, Use as instructed, Disp: 100 each, Rfl: 12 .  acetaminophen (TYLENOL) 500 MG tablet, Take by mouth., Disp: , Rfl:  .  albuterol (PROVENTIL) (2.5 MG/3ML) 0.083% nebulizer solution, Inhale into the lungs., Disp: , Rfl:  .  Alcohol Swabs (B-D SINGLE USE SWABS REGULAR) PADS, USE AS DIRECTED FOR BLOOD SUGAR CHECKS., Disp: 100 each, Rfl: 10 .  ALLERGY RELIEF 10 MG tablet, TAKE ONE TABLET BY MOUTH EVERY DAY, Disp: 30 tablet, Rfl: 10 .  asenapine (SAPHRIS) 5 MG SUBL 24 hr tablet, Place 1 tablet (5 mg total) 2 (two) times daily under the tongue., Disp: 60 tablet, Rfl: 0 .  atenolol (TENORMIN) 100 MG tablet, Take 1 tablet (100 mg total) by mouth daily., Disp: 30 tablet, Rfl: 3 .  atorvastatin (LIPITOR) 10 MG tablet, TAKE 1 TABLET BY MOUTH EACH DAY., Disp: 30 tablet, Rfl: 10 .  BREO ELLIPTA 100-25 MCG/INH AEPB, INHALE 1 PUFF BY MOUTH EVERY DAY, Disp: 30 each, Rfl: 10 .   cloZAPine (CLOZARIL) 100 MG tablet, Take 100 mg by mouth daily., Disp: , Rfl:  .  divalproex (DEPAKOTE ER) 500 MG 24 hr tablet, Take 2,000 mg by mouth at bedtime. , Disp: , Rfl:  .  furosemide (LASIX) 20 MG tablet, TAKE 3 TABLETS (60MG ) BY MOUTH ONCE DAILY, Disp: 90 tablet, Rfl: 10 .  glipiZIDE (GLUCOTROL) 5 MG tablet, Take 0.5 tablets (2.5 mg total) by mouth daily before breakfast., Disp: 30 tablet, Rfl: 3 .  glucose blood (TRUE METRIX BLOOD GLUCOSE TEST) test strip, FINGERSTICK BLOOD SUGAR ONCE DAILY, Disp: 100 each, Rfl: 10 .  haloperidol (HALDOL) 5 MG tablet, Take 5 mg by mouth 2 (two) times daily. , Disp: , Rfl:  .  ibuprofen (ADVIL,MOTRIN) 600 MG tablet, Take 1 tablet (600 mg total) by mouth every 6 (six) hours as needed (foot pain)., Disp: 12 tablet, Rfl: 0 .  levothyroxine (SYNTHROID) 75 MCG tablet, TAKE 1 TABLET BY MOUTH EACH DAY., Disp: 30 tablet, Rfl: 10 .  losartan (COZAAR) 50 MG tablet, TAKE 1 TABLET BY MOUTH EACH DAY. *IF BLOOD PRESSURE IS LESS THAN 100/70 DONT GIVE  MEDICATION*, Disp: 30 tablet, Rfl: 10 .  metFORMIN (GLUCOPHAGE) 1000 MG tablet, Take 1,000 mg by mouth daily., Disp: , Rfl:  .  metFORMIN (GLUCOPHAGE) 500 MG tablet, Take 1 tablet (500 mg total) by mouth 2 (two) times daily with a meal., Disp: 60 tablet, Rfl: 0 .  omeprazole (PRILOSEC) 20 MG capsule, Take 1 capsule (20 mg total) by mouth daily., Disp: 90 capsule, Rfl: 1 .  oxybutynin (DITROPAN) 5 MG tablet, TAKE 1 TABLET BY MOUTH ONCE DAILY, Disp: 30 tablet, Rfl: 10 .  Semaglutide,0.25 or 0.5MG /DOS, (OZEMPIC, 0.25 OR 0.5 MG/DOSE,) 2 MG/1.5ML SOPN, Inject 0.5 mg into the skin once a week., Disp: 1.5 mL, Rfl: 10 .  sitaGLIPtin (JANUVIA) 100 MG tablet, Take 1 tablet (100 mg total) by mouth daily., Disp: 30 tablet, Rfl: 3 .  colchicine 0.6 MG tablet, Take 1 tablet (0.6 mg total) by mouth 2 (two) times daily for 6 days., Disp: 6 tablet, Rfl: 2   No Known Allergies  ROS Review of Systems  Constitutional: Negative.   HENT:  Negative.   Eyes: Negative.   Respiratory: Negative.   Cardiovascular: Negative.   Gastrointestinal: Negative.   Endocrine: Negative.   Genitourinary: Negative.   Musculoskeletal: Negative.   Skin: Negative.   Allergic/Immunologic: Negative.   Neurological: Negative.   Hematological: Negative.  Psychiatric/Behavioral: Negative.   All other systems reviewed and are negative.    OBJECTIVE:    Physical Exam Vitals reviewed.  Constitutional:      Appearance: Normal appearance.  HENT:     Mouth/Throat:     Mouth: Mucous membranes are moist.  Eyes:     Pupils: Pupils are equal, round, and reactive to light.  Neck:     Vascular: No carotid bruit.  Cardiovascular:     Rate and Rhythm: Normal rate and regular rhythm.     Pulses: Normal pulses.     Heart sounds: Normal heart sounds.  Pulmonary:     Effort: Pulmonary effort is normal.     Breath sounds: Normal breath sounds.  Abdominal:     General: Bowel sounds are normal.     Palpations: Abdomen is soft. There is no hepatomegaly, splenomegaly or mass.     Tenderness: There is no abdominal tenderness.     Hernia: No hernia is present.  Musculoskeletal:     Cervical back: Neck supple.     Right lower leg: No edema.     Left lower leg: No edema.  Skin:    Findings: No rash.  Neurological:     Mental Status: He is alert and oriented to person, place, and time.     Motor: No weakness.  Psychiatric:        Mood and Affect: Mood normal.        Behavior: Behavior normal.     BP (!) 147/90   Pulse 98   Ht 5\' 7"  (1.702 m)   Wt 279 lb 4.8 oz (126.7 kg)   BMI 43.74 kg/m  Wt Readings from Last 3 Encounters:  10/14/19 279 lb 4.8 oz (126.7 kg)  10/06/19 276 lb 14.4 oz (125.6 kg)  09/14/19 277 lb (125.6 kg)    Health Maintenance Due  Topic Date Due  . Hepatitis C Screening  Never done  . FOOT EXAM  Never done  . OPHTHALMOLOGY EXAM  Never done  . COVID-19 Vaccine (1) Never done  . TETANUS/TDAP  Never done  .  HEMOGLOBIN A1C  05/01/2017  . INFLUENZA VACCINE  08/02/2019    There are no preventive care reminders to display for this patient.  CBC Latest Ref Rng & Units 08/12/2019 07/01/2019 05/23/2019  WBC 4.0 - 10.5 K/uL 7.3 7.2 6.2  Hemoglobin 13.0 - 17.0 g/dL 12.6(L) 12.6(L) 12.3(L)  Hematocrit 39 - 52 % 40.0 38.8(L) 38.8(L)  Platelets 150 - 400 K/uL 220 207 157   CMP Latest Ref Rng & Units 08/12/2019 07/01/2019 05/23/2019  Glucose 70 - 99 mg/dL 203(H) 232(H) 313(H)  BUN 6 - 20 mg/dL 11 14 19   Creatinine 0.61 - 1.24 mg/dL 1.09 1.08 1.34(H)  Sodium 135 - 145 mmol/L 137 136 132(L)  Potassium 3.5 - 5.1 mmol/L 3.9 4.3 4.6  Chloride 98 - 111 mmol/L 95(L) 95(L) 97(L)  CO2 22 - 32 mmol/L 30 31 27   Calcium 8.9 - 10.3 mg/dL 9.1 9.2 8.6(L)  Total Protein 6.5 - 8.1 g/dL 9.2(H) - -  Total Bilirubin 0.3 - 1.2 mg/dL 0.8 - -  Alkaline Phos 38 - 126 U/L 49 - -  AST 15 - 41 U/L 13(L) - -  ALT 0 - 44 U/L 15 - -    Lab Results  Component Value Date   TSH 1.842 11/01/2016   Lab Results  Component Value Date   ALBUMIN 3.9 08/12/2019   ANIONGAP 12 08/12/2019   Lab Results  Component Value Date   CHOL 146 11/24/2013   HDL 22 (L) 11/24/2013   LDLCALC 94 11/24/2013   Lab Results  Component Value Date   TRIG 152 11/24/2013   Lab Results  Component Value Date   HGBA1C 5.7 (H) 11/01/2016   HGBA1C 5.6 04/28/2016   HGBA1C 7.2 (H) 11/23/2013      ASSESSMENT & PLAN:   Problem List Items Addressed This Visit      Cardiovascular and Mediastinum   Essential hypertension - Primary (Chronic)    - Today, the patient's blood pressure is well managed on present regime. - The patient will continue the current treatment regimen.  - I encouraged the patient to eat a low-sodium diet to help control blood pressure. - I encouraged the patient to live an active lifestyle and complete activities that increases heart rate to 85% target heart rate at least 5 times per week for one hour.             Respiratory   Sleep apnea    Patient has lost weight.  He says that he is using CPAP machine        Endocrine   Diabetes mellitus ([place])    He takes his glipizide and Metformin to control the blood sugar.  He was again asked to continue to lose weight.      Relevant Medications   Semaglutide,0.25 or 0.5MG /DOS, (OZEMPIC, 0.25 OR 0.5 MG/DOSE,) 2 MG/1.5ML SOPN     Other   Morbid obesity ([place])    - I encouraged the patient to lose weight.  - I educated them on making healthy dietary choices including eating more fruits and vegetables and less fried foods. - I encouraged the patient to exercise more, and educated on the benefits of exercise including weight loss, diabetes management, and hypertension management.        Relevant Medications   Semaglutide,0.25 or 0.5MG /DOS, (OZEMPIC, 0.25 OR 0.5 MG/DOSE,) 2 MG/1.5ML SOPN   Tobacco abuse    - I instructed the patient to stop smoking and provided them with smoking cessation materials.  - I informed the patient that smoking puts them at increased risk for cancer, COPD, hypertension, and more.  - Informed the patient to seek help if they begin to have trouble breathing, develop chest pain, start to cough up blood, feel faint, or pass out.        Somnolence    Patient was told that he need to use the CPAP machine on a daily basis.         Meds ordered this encounter  Medications  . Semaglutide,0.25 or 0.5MG /DOS, (OZEMPIC, 0.25 OR 0.5 MG/DOSE,) 2 MG/1.5ML SOPN    Sig: Inject 0.5 mg into the skin once a week.    Dispense:  1.5 mL    Refill:  10  . glucose blood (TRUE METRIX BLOOD GLUCOSE TEST) test strip    Sig: FINGERSTICK BLOOD SUGAR ONCE DAILY    Dispense:  100 each    Refill:  10    Follow-up: No follow-ups on file.    Cletis Athens, MD [facility] [address] [place], [place] [address]   By signing my name below, I, [employer], attest that this documentation has been prepared under the direction and in  the presence of Dr. Cletis Athens Electronically Signed: Cletis Athens, MD 10/14/19, 12:43 PM  I personally performed the services described in this documentation, which was SCRIBED in my presence. The recorded information has been reviewed  and considered accurate. It has been edited as necessary during review. Cletis Athens, MD

## 2019-10-14 NOTE — Assessment & Plan Note (Signed)
-   I instructed the patient to stop smoking and provided them with smoking cessation materials.  - I informed the patient that smoking puts them at increased risk for cancer, COPD, hypertension, and more.  - Informed the patient to seek help if they begin to have trouble breathing, develop chest pain, start to cough up blood, feel faint, or pass out.  

## 2019-10-14 NOTE — Assessment & Plan Note (Signed)
-   I encouraged the patient to lose weight.  - I educated them on making healthy dietary choices including eating more fruits and vegetables and less fried foods. - I encouraged the patient to exercise more, and educated on the benefits of exercise including weight loss, diabetes management, and hypertension management.   

## 2019-10-16 ENCOUNTER — Other Ambulatory Visit: Payer: Self-pay | Admitting: Internal Medicine

## 2019-11-29 ENCOUNTER — Emergency Department: Payer: 59

## 2019-11-29 ENCOUNTER — Other Ambulatory Visit: Payer: Self-pay

## 2019-11-29 ENCOUNTER — Emergency Department
Admission: EM | Admit: 2019-11-29 | Discharge: 2019-11-30 | Disposition: A | Discharge status: Home / Self Care | Payer: 59 | Attending: Emergency Medicine | Admitting: Emergency Medicine

## 2019-11-29 DIAGNOSIS — Z7984 Long term (current) use of oral hypoglycemic drugs: Secondary | ICD-10-CM | POA: Insufficient documentation

## 2019-11-29 DIAGNOSIS — Z20822 Contact with and (suspected) exposure to covid-19: Secondary | ICD-10-CM | POA: Insufficient documentation

## 2019-11-29 DIAGNOSIS — E039 Hypothyroidism, unspecified: Secondary | ICD-10-CM | POA: Insufficient documentation

## 2019-11-29 DIAGNOSIS — J441 Chronic obstructive pulmonary disease with (acute) exacerbation: Secondary | ICD-10-CM | POA: Diagnosis not present

## 2019-11-29 DIAGNOSIS — Z79899 Other long term (current) drug therapy: Secondary | ICD-10-CM | POA: Diagnosis not present

## 2019-11-29 DIAGNOSIS — I10 Essential (primary) hypertension: Secondary | ICD-10-CM | POA: Diagnosis not present

## 2019-11-29 DIAGNOSIS — J90 Pleural effusion, not elsewhere classified: Secondary | ICD-10-CM | POA: Diagnosis not present

## 2019-11-29 DIAGNOSIS — R109 Unspecified abdominal pain: Secondary | ICD-10-CM | POA: Diagnosis present

## 2019-11-29 DIAGNOSIS — R059 Cough, unspecified: Secondary | ICD-10-CM | POA: Diagnosis not present

## 2019-11-29 DIAGNOSIS — J45909 Unspecified asthma, uncomplicated: Secondary | ICD-10-CM | POA: Diagnosis not present

## 2019-11-29 DIAGNOSIS — E119 Type 2 diabetes mellitus without complications: Secondary | ICD-10-CM | POA: Insufficient documentation

## 2019-11-29 DIAGNOSIS — Z87891 Personal history of nicotine dependence: Secondary | ICD-10-CM | POA: Insufficient documentation

## 2019-11-29 DIAGNOSIS — Z7951 Long term (current) use of inhaled steroids: Secondary | ICD-10-CM | POA: Insufficient documentation

## 2019-11-29 LAB — CBC
HCT: 34.8 % — ABNORMAL LOW (ref 39.0–52.0)
Hemoglobin: 10.4 g/dL — ABNORMAL LOW (ref 13.0–17.0)
MCH: 25.7 pg — ABNORMAL LOW (ref 26.0–34.0)
MCHC: 29.9 g/dL — ABNORMAL LOW (ref 30.0–36.0)
MCV: 85.9 fL (ref 80.0–100.0)
Platelets: 191 10*3/uL (ref 150–400)
RBC: 4.05 MIL/uL — ABNORMAL LOW (ref 4.22–5.81)
RDW: 17.2 % — ABNORMAL HIGH (ref 11.5–15.5)
WBC: 7.7 10*3/uL (ref 4.0–10.5)
nRBC: 0 % (ref 0.0–0.2)

## 2019-11-29 LAB — BASIC METABOLIC PANEL
Anion gap: 12 (ref 5–15)
BUN: 12 mg/dL (ref 6–20)
CO2: 30 mmol/L (ref 22–32)
Calcium: 8.7 mg/dL — ABNORMAL LOW (ref 8.9–10.3)
Chloride: 96 mmol/L — ABNORMAL LOW (ref 98–111)
Creatinine, Ser: 0.82 mg/dL (ref 0.61–1.24)
GFR, Estimated: >60 mL/min (ref >60)
Glucose, Bld: 134 mg/dL — ABNORMAL HIGH (ref 70–99)
Potassium: 4 mmol/L (ref 3.5–5.1)
Sodium: 138 mmol/L (ref 135–145)

## 2019-11-29 MED ORDER — KETOROLAC TROMETHAMINE 30 MG/ML IJ SOLN
10.0000 mg | Freq: Once | INTRAMUSCULAR | Status: AC
  Administered 2019-11-30: 9.9 mg via INTRAVENOUS
  Filled 2019-11-29: qty 1

## 2019-11-29 MED ORDER — SODIUM CHLORIDE 0.9 % IV BOLUS
1000.0000 mL | Freq: Once | INTRAVENOUS | Status: AC
  Administered 2019-11-29: 1000 mL via INTRAVENOUS

## 2019-11-29 NOTE — ED Triage Notes (Signed)
First Nurse: patient brought in by ems from [facility]. Patient with complaint of bilateral flank pain that started on Wednesday. EMS vs 124/70, hr 90, sat 99%, fsbs 158.

## 2019-11-29 NOTE — ED Triage Notes (Signed)
Pt states that on Wednesday he started to have flank pain. Pt states pain to both sides, that get worse when taking a deep breath.  Pt states he was brought in by ambulance. Mothers name is Kerrion Kemppainen.: [phone]  RN called his Mother and talked with her. Mother/legal guardian aware of pt being here for treatment and is okay with treatment.   Mother states pt has been coughing "a lot"

## 2019-11-30 ENCOUNTER — Encounter: Payer: Self-pay | Admitting: Radiology

## 2019-11-30 ENCOUNTER — Emergency Department: Payer: 59

## 2019-11-30 LAB — RESP PANEL BY RT-PCR (FLU A&B, COVID) ARPGX2
Influenza A by PCR: NEGATIVE
Influenza B by PCR: NEGATIVE
SARS Coronavirus 2 by RT PCR: NEGATIVE

## 2019-11-30 LAB — URINALYSIS, COMPLETE (UACMP) WITH MICROSCOPIC
Bacteria, UA: NONE SEEN
Bilirubin Urine: NEGATIVE
Glucose, UA: NEGATIVE mg/dL
Hgb urine dipstick: NEGATIVE
Ketones, ur: NEGATIVE mg/dL
Leukocytes,Ua: NEGATIVE
Nitrite: NEGATIVE
Protein, ur: 30 mg/dL — AB
Specific Gravity, Urine: 1.029 (ref 1.005–1.030)
pH: 5 (ref 5.0–8.0)

## 2019-11-30 LAB — FIBRIN DERIVATIVES D-DIMER (ARMC ONLY): Fibrin derivatives D-dimer (ARMC): 803.32 ng/mL (FEU) — ABNORMAL HIGH (ref 0.00–499.00)

## 2019-11-30 LAB — TROPONIN I (HIGH SENSITIVITY): Troponin I (High Sensitivity): <2 ng/L (ref <18)

## 2019-11-30 MED ORDER — IOHEXOL 350 MG/ML SOLN
100.0000 mL | Freq: Once | INTRAVENOUS | Status: AC | PRN
  Administered 2019-11-30: 100 mL via INTRAVENOUS

## 2019-11-30 MED ORDER — NAPROXEN 500 MG PO TABS: 500.0000 mg | ORAL_TABLET | Freq: Two times a day (BID) | ORAL | 0 refills | Status: DC

## 2019-11-30 NOTE — Discharge Instructions (Signed)
You may take Naprosyn 500 mg twice daily as needed for pain.  Return to the ER for worsening symptoms, persistent vomiting, difficulty breathing, fever or other concerns.

## 2019-11-30 NOTE — ED Provider Notes (Signed)
[facility] Emergency Department Provider Note   ____________________________________________   First MD Initiated Contact with Patient 11/29/19 2304     (approximate)  I have reviewed the triage vital signs and the nursing notes.   HISTORY  Chief Complaint Flank Pain    HPI John [name]. is a 44 y.o. male brought to the ED via EMS from group home with a chief complaint of bilateral flank pain.  Patient reports onset of bilateral flank pain x5 days, exacerbated by deep breathing and movement.  Mother reports patient has been coughing "a lot".  Patient denies fever, chills, chest pain, shortness of breath, abdominal pain, nausea, vomiting or urinary symptoms.     Past Medical History:  Diagnosis Date  . Anemia   . Asthma   . Diabetes mellitus   . 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 Morbid obesity ([place])   . Paranoid schizophrenia ([place])   . Personality disorder ([place])   . Sleep apnea   . Thyroid disease     Patient Active Problem List   Diagnosis Date Noted  . Bilateral leg weakness 08/17/2019  . Knee arthropathy 08/10/2019  . Acute gout due to renal impairment involving right foot 07/08/2019  . Encephalopathy acute   . Somnolence 11/01/2016  . AKI (acute kidney injury) ([place]) 11/01/2016  . Sepsis ([place]) 04/27/2016  . Respiratory failure ([place]) 12/20/2015  . Cardiomyopathy ([place]) 11/28/2015  . Tobacco abuse 11/28/2015  . Acute respiratory failure with hypercapnia ([place])   . Sleep apnea   . Essential hypertension 11/25/2015  . Hypothyroidism 11/25/2015  . Paranoid schizophrenia, chronic condition ([place]) 11/25/2015  . Acute respiratory failure ([place])   . Acute exacerbation of COPD with asthma ([place])   . Diabetes mellitus ([place])   . Diabetes mellitus due to underlying condition without complication, without long-term current use of insulin ([place])   . Morbid obesity ([place]) 09/29/2008    Past Surgical History:    Procedure Laterality Date  . FRACTURE SURGERY      Prior to Admission medications   Medication Sig Start Date End Date Taking? Authorizing Provider  Accu-Chek Softclix Lancets lancets Use as instructed 09/24/19   Cletis Athens, MD  acetaminophen (TYLENOL) 500 MG tablet Take by mouth.    [provider]  albuterol (PROVENTIL) (2.5 MG/3ML) 0.083% nebulizer solution Inhale into the lungs.    [provider]  Alcohol Swabs (B-D SINGLE USE SWABS REGULAR) PADS USE AS DIRECTED FOR BLOOD SUGAR CHECKS. 08/20/19   Cletis Athens, MD  ALLERGY RELIEF 10 MG tablet TAKE ONE TABLET BY MOUTH EVERY DAY 08/20/19   Cletis Athens, MD  asenapine (SAPHRIS) 5 MG SUBL 24 hr tablet Place 1 tablet (5 mg total) 2 (two) times daily under the tongue. 11/13/16   Bettey Costa, MD  atenolol (TENORMIN) 100 MG tablet Take 1 tablet (100 mg total) by mouth daily. 09/24/19   Cletis Athens, MD  atorvastatin (LIPITOR) 10 MG tablet TAKE 1 TABLET BY MOUTH EACH DAY. 08/20/19   Masoud, Viann Shove, MD  BREO ELLIPTA 100-25 MCG/INH AEPB INHALE 1 PUFF BY MOUTH EVERY DAY 08/20/19   Cletis Athens, MD  cloZAPine (CLOZARIL) 100 MG tablet Take 100 mg by mouth daily.    [provider]  colchicine 0.6 MG tablet Take 1 tablet (0.6 mg total) by mouth 2 (two) times daily for 6 days. 10/06/19 10/12/19  Lannie Fields, PA-C  divalproex (DEPAKOTE ER) 500 MG 24 hr tablet Take 2,000 mg  by mouth at bedtime.     [provider]  furosemide (LASIX) 20 MG tablet TAKE 3 TABLETS (60MG ) BY MOUTH ONCE DAILY 06/11/19   Cletis Athens, MD  glipiZIDE (GLUCOTROL) 5 MG tablet Take 0.5 tablets (2.5 mg total) by mouth daily before breakfast. 09/24/19   Cletis Athens, MD  glucose blood (TRUE METRIX BLOOD GLUCOSE TEST) test strip FINGERSTICK BLOOD SUGAR ONCE DAILY 10/14/19   Cletis Athens, MD  haloperidol (HALDOL) 5 MG tablet Take 5 mg by mouth 2 (two) times daily.     [provider]  ibuprofen (ADVIL,MOTRIN) 600 MG tablet Take 1 tablet  (600 mg total) by mouth every 6 (six) hours as needed (foot pain). 01/04/17   Orbie Pyo, MD  levothyroxine (SYNTHROID) 75 MCG tablet TAKE 1 TABLET BY MOUTH EACH DAY. 08/20/19   Cletis Athens, MD  losartan (COZAAR) 50 MG tablet TAKE 1 TABLET BY MOUTH EACH DAY. *IF BLOOD PRESSURE IS LESS THAN 100/70 DONT GIVE  MEDICATION* 08/20/19   Cletis Athens, MD  metFORMIN (GLUCOPHAGE) 1000 MG tablet TAKE 1 TABLET BY MOUTH TWICE A DAY WITH FOOD 10/16/19   Beckie Salts, FNP  metFORMIN (GLUCOPHAGE) 500 MG tablet Take 1 tablet (500 mg total) by mouth 2 (two) times daily with a meal. 09/24/19 10/24/19  Cletis Athens, MD  naproxen (NAPROSYN) 500 MG tablet Take 1 tablet (500 mg total) by mouth 2 (two) times daily with a meal. 11/30/19   Paulette Blanch, MD  omeprazole (PRILOSEC) 20 MG capsule Take 1 capsule (20 mg total) by mouth daily. 07/07/19   Cletis Athens, MD  oxybutynin (DITROPAN) 5 MG tablet TAKE 1 TABLET BY MOUTH ONCE DAILY 08/06/19   Cletis Athens, MD  Semaglutide,0.25 or 0.5MG /DOS, (OZEMPIC, 0.25 OR 0.5 MG/DOSE,) 2 MG/1.5ML SOPN Inject 0.5 mg into the skin once a week. 10/14/19   Cletis Athens, MD  sitaGLIPtin (JANUVIA) 100 MG tablet Take 1 tablet (100 mg total) by mouth daily. 09/24/19   Cletis Athens, MD    Allergies Patient has no known allergies.  Family History  Problem Relation Age of Onset  . Glaucoma Mother     Social History Social History   Tobacco Use  . Smoking status: Former Research scientist (life sciences)  . Smokeless tobacco: Never Used  Vaping Use  . Vaping Use: Never used  Substance Use Topics  . Alcohol use: Not Currently    Comment: only once in a while  . Drug use: No    Review of Systems  Constitutional: No fever/chills Eyes: No visual changes. ENT: No sore throat. Cardiovascular: Denies chest pain. Respiratory: Positive for cough.  Denies shortness of breath. Gastrointestinal: Positive for bilateral flank pain.  No abdominal pain.  No nausea, no vomiting.  No diarrhea.  No  constipation. Genitourinary: Negative for dysuria. Musculoskeletal: Negative for back pain. Skin: Negative for rash. Neurological: Negative for headaches, focal weakness or numbness.   ____________________________________________   PHYSICAL EXAM:  VITAL SIGNS: ED Triage Vitals  Enc Vitals Group     BP 11/29/19 2127 114/70     Pulse Rate 11/29/19 2127 98     Resp 11/29/19 2127 (!) 24     Temp 11/29/19 2127 98.6 F (37 C)     Temp src --      SpO2 11/29/19 2127 96 %     Weight 11/29/19 2126 (!) 307 lb (139.3 kg)     Height 11/29/19 2126 6' (1.829 m)     Head Circumference --      Peak  Flow --      Pain Score 11/29/19 2126 5     Pain Loc --      Pain Edu? --      Excl. in [place]? --     Constitutional: Alert and oriented. Well appearing and in no acute distress. Eyes: Conjunctivae are normal. PERRL. EOMI. Head: Atraumatic. Nose: No congestion/rhinnorhea. Mouth/Throat: Mucous membranes are moist.   Neck: No stridor.   Cardiovascular: Normal rate, regular rhythm. Grossly normal heart sounds.  Good peripheral circulation. Respiratory: Normal respiratory effort.  No retractions. Lungs CTAB. Gastrointestinal: Soft and nontender to light or deep palpation. No distention. No abdominal bruits.  Mild right CVA tenderness. Musculoskeletal: No lower extremity tenderness nor edema.  No joint effusions. Neurologic:  Normal speech and language. No gross focal neurologic deficits are appreciated. No gait instability. Skin:  Skin is warm, dry and intact. No rash noted.  No vesicles. Psychiatric: Mood and affect are normal. Speech and behavior are normal.  ____________________________________________   LABS (all labs ordered are listed, but only abnormal results are displayed)  Labs Reviewed  URINALYSIS, COMPLETE (UACMP) WITH MICROSCOPIC - Abnormal; Notable for the following components:      Result Value   Color, Urine YELLOW (*)    APPearance HAZY (*)    Protein, ur 30 (*)    All  other components within normal limits  BASIC METABOLIC PANEL - Abnormal; Notable for the following components:   Chloride 96 (*)    Glucose, Bld 134 (*)    Calcium 8.7 (*)    All other components within normal limits  CBC - Abnormal; Notable for the following components:   RBC 4.05 (*)    Hemoglobin 10.4 (*)    HCT 34.8 (*)    MCH 25.7 (*)    MCHC 29.9 (*)    RDW 17.2 (*)    All other components within normal limits  FIBRIN DERIVATIVES D-DIMER ([facility] ONLY) - Abnormal; Notable for the following components:   Fibrin derivatives D-dimer ([facility]) 803.32 (*)    All other components within normal limits  RESP PANEL BY RT-PCR (FLU A&B, COVID) ARPGX2  TROPONIN I (HIGH SENSITIVITY)   ____________________________________________  EKG  ED ECG REPORT I, Deyvi Bonanno J, the attending physician, personally viewed and interpreted this ECG.   Date: 11/29/2019  EKG Time: 0140  Rate: 78  Rhythm: normal EKG, normal sinus rhythm  Axis: Normal  Intervals:none  ST&T Change: Nonspecific  ____________________________________________  RADIOLOGY I, Nida Manfredi J, personally viewed and evaluated these images (plain radiographs) as part of my medical decision making, as well as reviewing the written report by the radiologist.  ED MD interpretation: No acute cardiopulmonary process; no kidney stones, no PE, stable small bilateral pleural effusions  Official radiology report(s): DG Chest 2 View  Result Date: 11/29/2019 CLINICAL DATA:  Cough EXAM: CHEST - 2 VIEW COMPARISON:  08/01/2017 FINDINGS: The heart size and mediastinal contours are within normal limits. Both lungs are clear. The visualized skeletal structures are unremarkable. IMPRESSION: No active cardiopulmonary disease. Electronically Signed   By: Inez Catalina M.D.   On: 11/29/2019 23:45   CT Angio Chest PE W/Cm &/Or Wo Cm  Result Date: 11/30/2019 CLINICAL DATA:  Cough, elevated D-dimer EXAM: CT ANGIOGRAPHY CHEST WITH CONTRAST TECHNIQUE:  Multidetector CT imaging of the chest was performed using the standard protocol during bolus administration of intravenous contrast. Multiplanar CT image reconstructions and MIPs were obtained to evaluate the vascular anatomy. CONTRAST:  143mL OMNIPAQUE IOHEXOL 350 MG/ML SOLN COMPARISON:  08/01/2017 FINDINGS: Cardiovascular: Satisfactory opacification of the pulmonary arteries to the segmental level. No evidence of pulmonary embolism. Normal heart size. No pericardial effusion. Variant anatomy with aberrant origin of the right subclavian artery and common origin of the common carotid arteries. The thoracic aorta is otherwise unremarkable. Mediastinum/Nodes: Nodular soft tissue within the anterior mediastinum is stable since prior examination as well as remote prior examination of 03/27/2013 and likely represents residual thymic tissue. Visualized thyroid is unremarkable. No pathologic thoracic adenopathy. Esophagus unremarkable. Lungs/Pleura: Small bilateral pleural effusions are again identified. The lungs are clear. No pneumothorax. Central airways are widely patent. Upper Abdomen: No acute abnormality. Musculoskeletal: No chest wall abnormality. No acute or significant osseous findings. Review of the MIP images confirms the above findings. IMPRESSION: No pulmonary embolism. Stable bilateral small small pleural effusions Stable prominent nodular soft tissue within the anterior mediastinum, the likely representing residual thymic tissue given its stability since remote prior examination. Electronically Signed   By: Fidela Salisbury MD   On: 11/30/2019 01:21   CT Renal Stone Study  Result Date: 11/29/2019 CLINICAL DATA:  Flank pain, both sides. Worse when takes T breath. Suspected kidney stone. EXAM: CT ABDOMEN AND PELVIS WITHOUT CONTRAST TECHNIQUE: Multidetector CT imaging of the abdomen and pelvis was performed following the standard protocol without IV contrast. COMPARISON:  CT renal 07/01/2019. CT angio chest  04/27/2016. CT chest 05/17/2014. CT chest 05/27/2013. FINDINGS: Lower chest: Bilateral trace pleural effusions slightly increased compared to prior. Hepatobiliary: No focal liver abnormality. No gallstones, gallbladder wall thickening, or pericholecystic fluid. No biliary dilatation. Pancreas: Diffusely atrophic. No focal lesion. Otherwise normal pancreatic contour. No surrounding inflammatory changes. No main pancreatic ductal dilatation. Spleen: Normal in size without focal abnormality. Couple of splenules noted. Adrenals/Urinary Tract: No adrenal nodule bilaterally. No nephrolithiasis, no hydronephrosis, and no contour-deforming renal mass. No ureterolithiasis or hydroureter. The urinary bladder is unremarkable. Stomach/Bowel: Stomach is within normal limits. No evidence of bowel wall thickening or dilatation. Appendix appears normal. Vascular/Lymphatic: No abdominal aorta or iliac aneurysm. Mild atherosclerotic plaque. No abdominal, pelvic, or inguinal lymphadenopathy. Reproductive: Prostate is unremarkable. Other: Interval development of trace simple free fluid within the pelvis. No intraperitoneal free gas. No organized fluid collection. Musculoskeletal: No abdominal wall hernia or abnormality Similar appearing (compared to CT renal 07/01/2019, but new compared to CT angio chest 04/27/2016) sclerotic lesion within the anterior L1 vertebral body. Otherwise no suspicious lytic or blastic osseous lesions. No acute displaced fracture. Multilevel degenerative changes of the spine. IMPRESSION: 1. No nephroureterolithiasis bilaterally. 2. Interval increase in trace bilateral pleural effusions. 3. Interval development of trace simple free fluid within the pelvis. 4. Indeterminate similar appearing sclerotic lesion within the anterior L1 vertebral body (compared to CT renal 07/01/2019, but new compared to CT angio chest 04/27/2016). Electronically Signed   By: Iven Finn M.D.   On: 11/29/2019 23:45     ____________________________________________   PROCEDURES  Procedure(s) performed (including Critical Care):  Procedures   ____________________________________________   INITIAL IMPRESSION / ASSESSMENT AND PLAN / ED COURSE  As part of my medical decision making, I reviewed the following data within the [place] notes reviewed and incorporated, Labs reviewed, EKG interpreted, Old chart reviewed (10/14/2019 office visit), Radiograph reviewed and Notes from prior ED visits (minor care visits involving gout)     44 year old male coming from a group home with bilateral flank pain, cough, worsened with deep breathing.  Differential diagnosis includes but is not limited to viral process such as  COVID-19, ACS, CAP, PE, dissection, kidney stone, musculoskeletal, etc.  Laboratory results unremarkable.  Will check troponin, D-dimer, respiratory panel.  Obtain chest x-ray, CT renal colic study.  Initiate IV fluid resuscitation, Toradol for pain.  Will reassess.   Clinical Course as of Nov 29 209  [facility] Nov 30, 2019  0047 D-dimer elevated.  We will proceed with CTA chest to evaluate for PE.   [JS]  0127 Patient resting no acute distress.  Updated him of all laboratory and imaging results.  Will discharge back to group home with prescription for Naprosyn to use as needed.  Strict return precautions given.  Patient verbalizes understanding agrees with plan of care.   [JS]    Clinical Course User Index [JS] Paulette Blanch, MD     ____________________________________________   FINAL CLINICAL IMPRESSION(S) / ED DIAGNOSES  Final diagnoses:  Flank pain     ED Discharge Orders         Ordered    naproxen (NAPROSYN) 500 MG tablet  2 times daily with meals        11/30/19 0129          *Please note:  Jonavin Seder. was evaluated in Emergency Department on 11/30/2019 for the symptoms described in the history of present illness. He was evaluated in the  context of the global COVID-19 pandemic, which necessitated consideration that the patient might be at risk for infection with the SARS-CoV-2 virus that causes COVID-19. Institutional protocols and algorithms that pertain to the evaluation of patients at risk for COVID-19 are in a state of rapid change based on information released by regulatory bodies including the CDC and federal and state organizations. These policies and algorithms were followed during the patient's care in the ED.  Some ED evaluations and interventions may be delayed as a result of limited staffing during and the pandemic.*   Note:  This document was prepared using Dragon voice recognition software and may include unintentional dictation errors.   Paulette Blanch, MD 11/30/19 [phone]

## 2019-11-30 NOTE — ED Notes (Signed)
Pt discharged with [facility] from [facility].

## 2019-12-05 ENCOUNTER — Other Ambulatory Visit: Payer: Self-pay | Admitting: Internal Medicine

## 2019-12-07 ENCOUNTER — Other Ambulatory Visit: Payer: Self-pay | Admitting: Internal Medicine

## 2019-12-08 ENCOUNTER — Ambulatory Visit (INDEPENDENT_AMBULATORY_CARE_PROVIDER_SITE_OTHER): Payer: 59 | Admitting: Internal Medicine

## 2019-12-08 ENCOUNTER — Encounter: Payer: Self-pay | Admitting: Internal Medicine

## 2019-12-08 ENCOUNTER — Other Ambulatory Visit: Payer: Self-pay

## 2019-12-08 VITALS — BP 117/84 | HR 89 | Ht 75.0 in | Wt 282.5 lb

## 2019-12-08 DIAGNOSIS — E139 Other specified diabetes mellitus without complications: Secondary | ICD-10-CM

## 2019-12-08 DIAGNOSIS — I10 Essential (primary) hypertension: Secondary | ICD-10-CM

## 2019-12-08 DIAGNOSIS — I428 Other cardiomyopathies: Secondary | ICD-10-CM

## 2019-12-08 DIAGNOSIS — Z720 Tobacco use: Secondary | ICD-10-CM

## 2019-12-08 LAB — GLUCOSE, POCT (MANUAL RESULT ENTRY): POC Glucose: 89 mg/dl (ref 70–99)

## 2019-12-08 NOTE — Assessment & Plan Note (Signed)
Stable at the present time. 

## 2019-12-08 NOTE — Progress Notes (Signed)
Established Patient Office Visit  Subjective:  Patient ID: John Ducksworth., male    DOB: 1975-03-23  Age: 44 y.o. MRN: [id number]  CC:  Chief Complaint  Patient presents with  . Diabetes    Patient is here for 3 month follow up    Diabetes He presents for his follow-up diabetic visit. He has type 2 diabetes mellitus. Hypoglycemia symptoms include speech difficulty and tremors. Pertinent negatives for hypoglycemia include no dizziness, headaches or seizures. Associated symptoms include weight loss. Pertinent negatives for diabetes include no blurred vision, no chest pain, no fatigue, no foot paresthesias, no foot ulcerations, no polydipsia, no polyphagia, no polyuria and no visual change. There are no hypoglycemic complications. Risk factors for coronary artery disease include diabetes mellitus, obesity, male sex and tobacco exposure. He is following a diabetic, low fat/cholesterol and low salt diet.    John Dawley. presents for diabetes  check  Past Medical History:  Diagnosis Date  . Anemia   . Asthma   . Diabetes mellitus   . 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 Morbid obesity ([place])   . Paranoid schizophrenia ([place])   . Personality disorder ([place])   . Sleep apnea   . Thyroid disease     Past Surgical History:  Procedure Laterality Date  . FRACTURE SURGERY      Family History  Problem Relation Age of Onset  . Glaucoma Mother     Social History   Socioeconomic History  . Marital status: Single    Spouse name: Not on file  . Number of children: Not on file  . Years of education: Not on file  . Highest education level: Not on file  Occupational History  . Not on file  Tobacco Use  . Smoking status: Former Research scientist (life sciences)  . Smokeless tobacco: Never Used  Vaping Use  . Vaping Use: Never used  Substance and Sexual Activity  . Alcohol use: Not Currently    Comment: only once in a while  . Drug use: No  . Sexual activity: Not on file   Other Topics Concern  . Not on file  Social History Narrative  . Not on file   Social Determinants of Health   Financial Resource Strain:   . Difficulty of Paying Living Expenses: Not on file  Food Insecurity:   . Worried About Charity fundraiser in the Last Year: Not on file  . Ran Out of Food in the Last Year: Not on file  Transportation Needs:   . Lack of Transportation (Medical): Not on file  . Lack of Transportation (Non-Medical): Not on file  Physical Activity:   . Days of Exercise per Week: Not on file  . Minutes of Exercise per Session: Not on file  Stress:   . Feeling of Stress : Not on file  Social Connections:   . Frequency of Communication with Friends and Family: Not on file  . Frequency of Social Gatherings with Friends and Family: Not on file  . Attends Religious Services: Not on file  . Active Member of Clubs or Organizations: Not on file  . Attends Archivist Meetings: Not on file  . Marital Status: Not on file  Intimate Partner Violence:   . Fear of Current or Ex-Partner: Not on file  . Emotionally Abused: Not on file  . Physically Abused: Not on file  . Sexually Abused: Not on file     Current Outpatient Medications:  .  Accu-Chek Softclix Lancets lancets, Use as instructed, Disp: 100 each, Rfl: 12 .  acetaminophen (TYLENOL) 500 MG tablet, Take by mouth., Disp: , Rfl:  .  albuterol (PROVENTIL) (2.5 MG/3ML) 0.083% nebulizer solution, USE 1 VIAL VIA NEBULIZER 3 TIMES A DAY, Disp: 300 mL, Rfl: 6 .  Alcohol Swabs (B-D SINGLE USE SWABS REGULAR) PADS, USE AS DIRECTED FOR BLOOD SUGAR CHECKS., Disp: 100 each, Rfl: 10 .  ALLERGY RELIEF 10 MG tablet, TAKE ONE TABLET BY MOUTH EVERY DAY, Disp: 30 tablet, Rfl: 10 .  asenapine (SAPHRIS) 5 MG SUBL 24 hr tablet, Place 1 tablet (5 mg total) 2 (two) times daily under the tongue., Disp: 60 tablet, Rfl: 0 .  atenolol (TENORMIN) 100 MG tablet, Take 1 tablet (100 mg total) by mouth daily., Disp: 30 tablet, Rfl: 3 .   atorvastatin (LIPITOR) 10 MG tablet, TAKE 1 TABLET BY MOUTH EACH DAY., Disp: 30 tablet, Rfl: 10 .  BREO ELLIPTA 100-25 MCG/INH AEPB, INHALE 1 PUFF BY MOUTH EVERY DAY, Disp: 30 each, Rfl: 10 .  cloZAPine (CLOZARIL) 100 MG tablet, Take 100 mg by mouth daily., Disp: , Rfl:  .  divalproex (DEPAKOTE ER) 500 MG 24 hr tablet, Take 2,000 mg by mouth at bedtime. , Disp: , Rfl:  .  furosemide (LASIX) 20 MG tablet, TAKE 3 TABLETS (60MG ) BY MOUTH ONCE DAILY, Disp: 90 tablet, Rfl: 10 .  glucose blood (TRUE METRIX BLOOD GLUCOSE TEST) test strip, FINGERSTICK BLOOD SUGAR ONCE DAILY, Disp: 100 each, Rfl: 10 .  haloperidol (HALDOL) 5 MG tablet, Take 5 mg by mouth 2 (two) times daily. , Disp: , Rfl:  .  ibuprofen (ADVIL,MOTRIN) 600 MG tablet, Take 1 tablet (600 mg total) by mouth every 6 (six) hours as needed (foot pain)., Disp: 12 tablet, Rfl: 0 .  levothyroxine (SYNTHROID) 75 MCG tablet, TAKE 1 TABLET BY MOUTH EACH DAY., Disp: 30 tablet, Rfl: 10 .  losartan (COZAAR) 50 MG tablet, TAKE 1 TABLET BY MOUTH EACH DAY. *IF BLOOD PRESSURE IS LESS THAN 100/70 DONT GIVE  MEDICATION*, Disp: 30 tablet, Rfl: 10 .  metFORMIN (GLUCOPHAGE) 1000 MG tablet, TAKE 1 TABLET BY MOUTH TWICE A DAY WITH FOOD, Disp: 60 tablet, Rfl: 4 .  metFORMIN (GLUCOPHAGE) 500 MG tablet, Take 1 tablet (500 mg total) by mouth 2 (two) times daily with a meal., Disp: 60 tablet, Rfl: 0 .  naproxen (NAPROSYN) 500 MG tablet, Take 1 tablet (500 mg total) by mouth 2 (two) times daily with a meal., Disp: 14 tablet, Rfl: 0 .  omeprazole (PRILOSEC) 20 MG capsule, Take 1 capsule (20 mg total) by mouth daily., Disp: 90 capsule, Rfl: 1 .  oxybutynin (DITROPAN) 5 MG tablet, TAKE 1 TABLET BY MOUTH ONCE DAILY, Disp: 30 tablet, Rfl: 10 .  Semaglutide,0.25 or 0.5MG /DOS, (OZEMPIC, 0.25 OR 0.5 MG/DOSE,) 2 MG/1.5ML SOPN, Inject 0.5 mg into the skin once a week., Disp: 1.5 mL, Rfl: 10 .  sitaGLIPtin (JANUVIA) 100 MG tablet, Take 1 tablet (100 mg total) by mouth daily., Disp: 30  tablet, Rfl: 3   No Known Allergies  ROS Review of Systems  Constitutional: Positive for weight loss. Negative for fatigue.  HENT: Negative.   Eyes: Negative.  Negative for blurred vision.  Respiratory: Negative.   Cardiovascular: Negative.  Negative for chest pain.  Gastrointestinal: Negative.   Endocrine: Negative.  Negative for polydipsia, polyphagia and polyuria.  Genitourinary: Negative.   Musculoskeletal: Negative.   Skin: Negative.   Allergic/Immunologic: Negative.   Neurological: Positive for tremors and  speech difficulty. Negative for dizziness, seizures and headaches.  Hematological: Negative.   Psychiatric/Behavioral: Negative.   All other systems reviewed and are negative.     Objective:    Physical Exam Vitals reviewed.  Constitutional:      Appearance: Normal appearance.  HENT:     Mouth/Throat:     Mouth: Mucous membranes are moist.  Eyes:     Pupils: Pupils are equal, round, and reactive to light.  Neck:     Vascular: No carotid bruit.  Cardiovascular:     Rate and Rhythm: Normal rate and regular rhythm.     Pulses: Normal pulses.     Heart sounds: Normal heart sounds.  Pulmonary:     Effort: Pulmonary effort is normal.     Breath sounds: Normal breath sounds.  Abdominal:     General: Bowel sounds are normal.     Palpations: Abdomen is soft. There is no hepatomegaly, splenomegaly or mass.     Tenderness: There is no abdominal tenderness.     Hernia: No hernia is present.  Musculoskeletal:     Cervical back: Neck supple.     Right lower leg: No edema.     Left lower leg: No edema.  Skin:    Findings: No rash.  Neurological:     Mental Status: He is alert and oriented to person, place, and time.     Motor: No weakness.  Psychiatric:        Mood and Affect: Mood normal.        Behavior: Behavior normal.     BP 117/84   Pulse 89   Ht 6\' 3"  (1.905 m)   Wt 282 lb 8 oz (128.1 kg)   BMI 35.31 kg/m  Wt Readings from Last 3 Encounters:   12/08/19 282 lb 8 oz (128.1 kg)  11/29/19 (!) 307 lb (139.3 kg)  10/14/19 279 lb 4.8 oz (126.7 kg)     Health Maintenance Due  Topic Date Due  . Hepatitis C Screening  Never done  . FOOT EXAM  Never done  . OPHTHALMOLOGY EXAM  Never done  . COVID-19 Vaccine (1) Never done  . TETANUS/TDAP  Never done  . HEMOGLOBIN A1C  05/01/2017  . INFLUENZA VACCINE  08/02/2019    There are no preventive care reminders to display for this patient.  Lab Results  Component Value Date   TSH 1.842 11/01/2016   Lab Results  Component Value Date   WBC 7.7 11/29/2019   HGB 10.4 (L) 11/29/2019   HCT 34.8 (L) 11/29/2019   MCV 85.9 11/29/2019   PLT 191 11/29/2019   Lab Results  Component Value Date   NA 138 11/29/2019   K 4.0 11/29/2019   CO2 30 11/29/2019   GLUCOSE 134 (H) 11/29/2019   BUN 12 11/29/2019   CREATININE 0.82 11/29/2019   BILITOT 0.8 08/12/2019   ALKPHOS 49 08/12/2019   AST 13 (L) 08/12/2019   ALT 15 08/12/2019   PROT 9.2 (H) 08/12/2019   ALBUMIN 3.9 08/12/2019   CALCIUM 8.7 (L) 11/29/2019   ANIONGAP 12 11/29/2019   Lab Results  Component Value Date   CHOL 146 11/24/2013   Lab Results  Component Value Date   HDL 22 (L) 11/24/2013   Lab Results  Component Value Date   LDLCALC 94 11/24/2013   Lab Results  Component Value Date   TRIG 152 11/24/2013   No results found for: [facility] Lab Results  Component Value Date   HGBA1C 5.7 (  H) 11/01/2016      Assessment & Plan:   Problem List Items Addressed This Visit      Cardiovascular and Mediastinum   Essential hypertension (Chronic)    - Today, the patient's blood pressure is well managed on atenolol . - The patient will continue the current treatment regimen.  - I encouraged the patient to eat a low-sodium diet to help control blood pressure. - I encouraged the patient to live an active lifestyle and complete activities that increases heart rate to 85% target heart rate at least 5 times per week for one hour.           Myocardiopathy ([place])    Stable at the present time.        Endocrine   Diabetes mellitus ([place]) - Primary    - The patient's blood sugar is low today  Will stop glipzide. - The patient will continue the current treatment regimen.  - I encouraged the patient to regularly check blood sugar.  - I encouraged the patient to monitor diet. I encouraged the patient to eat low-carb and low-sugar to help prevent blood sugar spikes.  - I encouraged the patient to continue following their prescribed treatment plan for diabetes - I informed the patient to get help if blood sugar drops below 54mg /dL, or if suddenly have trouble thinking clearly or breathing.          Relevant Orders   POCT glucose (manual entry) (Completed)     Other   Morbid obesity ([place])    - I encouraged the patient to lose weight.  - I educated them on making healthy dietary choices including eating more fruits and vegetables and less fried foods. - I encouraged the patient to exercise more, and educated on the benefits of exercise including weight loss, diabetes management, and hypertension management.        Tobacco abuse    - I instructed the patient to stop smoking and provided them with smoking cessation materials.  - I informed the patient that smoking puts them at increased risk for cancer, COPD, hypertension, and more.  - Informed the patient to seek help if they begin to have trouble breathing, develop chest pain, start to cough up blood, feel faint, or pass out.           No orders of the defined types were placed in this encounter.   Follow-up: No follow-ups on file.    Cletis Athens, MD

## 2019-12-08 NOTE — Assessment & Plan Note (Signed)
-   I encouraged the patient to lose weight.  - I educated them on making healthy dietary choices including eating more fruits and vegetables and less fried foods. - I encouraged the patient to exercise more, and educated on the benefits of exercise including weight loss, diabetes management, and hypertension management.   

## 2019-12-08 NOTE — Assessment & Plan Note (Signed)
-   Today, the patient's blood pressure is well managed on atenolol - The patient will continue the current treatment regimen.  - I encouraged the patient to eat a low-sodium diet to help control blood pressure. - I encouraged the patient to live an active lifestyle and complete activities that increases heart rate to 85% target heart rate at least 5 times per week for one hour.    

## 2019-12-08 NOTE — Assessment & Plan Note (Signed)
-   I instructed the patient to stop smoking and provided them with smoking cessation materials.  - I informed the patient that smoking puts them at increased risk for cancer, COPD, hypertension, and more.  - Informed the patient to seek help if they begin to have trouble breathing, develop chest pain, start to cough up blood, feel faint, or pass out.  

## 2019-12-08 NOTE — Assessment & Plan Note (Signed)
-   The patient's blood sugar is low today  Will stop glipzide. - The patient will continue the current treatment regimen.  - I encouraged the patient to regularly check blood sugar.  - I encouraged the patient to monitor diet. I encouraged the patient to eat low-carb and low-sugar to help prevent blood sugar spikes.  - I encouraged the patient to continue following their prescribed treatment plan for diabetes - I informed the patient to get help if blood sugar drops below 54mg /dL, or if suddenly have trouble thinking clearly or breathing.

## 2019-12-15 ENCOUNTER — Ambulatory Visit: Payer: 59 | Admitting: Internal Medicine

## 2019-12-15 ENCOUNTER — Other Ambulatory Visit: Payer: Self-pay

## 2019-12-18 ENCOUNTER — Ambulatory Visit: Payer: 59 | Admitting: Family Medicine

## 2019-12-21 ENCOUNTER — Other Ambulatory Visit: Payer: Self-pay

## 2019-12-21 ENCOUNTER — Emergency Department: Payer: 59

## 2019-12-21 ENCOUNTER — Emergency Department
Admission: EM | Admit: 2019-12-21 | Discharge: 2019-12-21 | Disposition: A | Discharge status: Home / Self Care | Payer: 59 | Attending: Emergency Medicine | Admitting: Emergency Medicine

## 2019-12-21 ENCOUNTER — Encounter: Payer: Self-pay | Admitting: Emergency Medicine

## 2019-12-21 DIAGNOSIS — E119 Type 2 diabetes mellitus without complications: Secondary | ICD-10-CM | POA: Insufficient documentation

## 2019-12-21 DIAGNOSIS — Z79899 Other long term (current) drug therapy: Secondary | ICD-10-CM | POA: Diagnosis not present

## 2019-12-21 DIAGNOSIS — I10 Essential (primary) hypertension: Secondary | ICD-10-CM | POA: Diagnosis not present

## 2019-12-21 DIAGNOSIS — Z87891 Personal history of nicotine dependence: Secondary | ICD-10-CM | POA: Diagnosis not present

## 2019-12-21 DIAGNOSIS — E039 Hypothyroidism, unspecified: Secondary | ICD-10-CM | POA: Insufficient documentation

## 2019-12-21 DIAGNOSIS — G4719 Other hypersomnia: Secondary | ICD-10-CM

## 2019-12-21 DIAGNOSIS — G4730 Sleep apnea, unspecified: Secondary | ICD-10-CM | POA: Diagnosis not present

## 2019-12-21 DIAGNOSIS — J45909 Unspecified asthma, uncomplicated: Secondary | ICD-10-CM | POA: Diagnosis not present

## 2019-12-21 DIAGNOSIS — R109 Unspecified abdominal pain: Secondary | ICD-10-CM | POA: Insufficient documentation

## 2019-12-21 DIAGNOSIS — Z7984 Long term (current) use of oral hypoglycemic drugs: Secondary | ICD-10-CM | POA: Diagnosis not present

## 2019-12-21 DIAGNOSIS — R4182 Altered mental status, unspecified: Secondary | ICD-10-CM | POA: Diagnosis present

## 2019-12-21 LAB — COMPREHENSIVE METABOLIC PANEL
ALT: 9 U/L (ref 0–44)
AST: 13 U/L — ABNORMAL LOW (ref 15–41)
Albumin: 4 g/dL (ref 3.5–5.0)
Alkaline Phosphatase: 47 U/L (ref 38–126)
Anion gap: 12 (ref 5–15)
BUN: 16 mg/dL (ref 6–20)
CO2: 28 mmol/L (ref 22–32)
Calcium: 9.2 mg/dL (ref 8.9–10.3)
Chloride: 101 mmol/L (ref 98–111)
Creatinine, Ser: 0.77 mg/dL (ref 0.61–1.24)
GFR, Estimated: >60 mL/min (ref >60)
Glucose, Bld: 106 mg/dL — ABNORMAL HIGH (ref 70–99)
Potassium: 4.1 mmol/L (ref 3.5–5.1)
Sodium: 141 mmol/L (ref 135–145)
Total Bilirubin: 0.9 mg/dL (ref 0.3–1.2)
Total Protein: 9 g/dL — ABNORMAL HIGH (ref 6.5–8.1)

## 2019-12-21 LAB — URINE DRUG SCREEN, QUALITATIVE (ARMC ONLY)
Amphetamines, Ur Screen: NOT DETECTED
Barbiturates, Ur Screen: NOT DETECTED
Benzodiazepine, Ur Scrn: NOT DETECTED
Cannabinoid 50 Ng, Ur ~~LOC~~: NOT DETECTED
Cocaine Metabolite,Ur ~~LOC~~: NOT DETECTED
MDMA (Ecstasy)Ur Screen: NOT DETECTED
Methadone Scn, Ur: NOT DETECTED
Opiate, Ur Screen: NOT DETECTED
Phencyclidine (PCP) Ur S: NOT DETECTED
Tricyclic, Ur Screen: NOT DETECTED

## 2019-12-21 LAB — CBC
HCT: 41.7 % (ref 39.0–52.0)
Hemoglobin: 12.3 g/dL — ABNORMAL LOW (ref 13.0–17.0)
MCH: 26.1 pg (ref 26.0–34.0)
MCHC: 29.5 g/dL — ABNORMAL LOW (ref 30.0–36.0)
MCV: 88.5 fL (ref 80.0–100.0)
Platelets: 171 10*3/uL (ref 150–400)
RBC: 4.71 MIL/uL (ref 4.22–5.81)
RDW: 16.4 % — ABNORMAL HIGH (ref 11.5–15.5)
WBC: 7.3 10*3/uL (ref 4.0–10.5)
nRBC: 0 % (ref 0.0–0.2)

## 2019-12-21 LAB — ETHANOL: Alcohol, Ethyl (B): <10 mg/dL (ref <10)

## 2019-12-21 LAB — SALICYLATE LEVEL: Salicylate Lvl: <7 mg/dL — ABNORMAL LOW (ref 7.0–30.0)

## 2019-12-21 LAB — LITHIUM LEVEL: Lithium Lvl: <0.06 mmol/L — ABNORMAL LOW (ref 0.60–1.20)

## 2019-12-21 LAB — ACETAMINOPHEN LEVEL: Acetaminophen (Tylenol), Serum: <10 ug/mL — ABNORMAL LOW (ref 10–30)

## 2019-12-21 MED ORDER — IOHEXOL 300 MG/ML  SOLN
125.0000 mL | Freq: Once | INTRAMUSCULAR | Status: AC | PRN
  Administered 2019-12-21: 18:00:00 125 mL via INTRAVENOUS

## 2019-12-21 NOTE — ED Notes (Signed)
Patient given a sandwich tray at this time.

## 2019-12-21 NOTE — ED Notes (Signed)
Report off to [employer]

## 2019-12-21 NOTE — Discharge Instructions (Addendum)
As we discussed, please ensure that John [name] is using his CPAP at night.  If this is not working appropriately or if he is not using it enough, it can certainly cause his sleepiness during the daytime.  We did a CT scan of his abdomen and pelvis that did not show any problems on the inside.  You may continue to use naproxen for the pain that he feels on his left side.  Continue all of his psychiatric medications.  Please return to the ED with any additional acute concerns.

## 2019-12-21 NOTE — ED Notes (Signed)
Pt reports that he feels fine. He denies any pain at the moment. Pt states "my caregiver said I haven't been acting myself lately."

## 2019-12-21 NOTE — ED Notes (Signed)
Patient's legal guardian given written and verbal discharge instructions. Legal guardian verbalizes understanding.

## 2019-12-21 NOTE — ED Triage Notes (Signed)
Pt to ED via POV with caregive from [facility] with Caregiver [name] Poteat. Pt states was brought in by caregiver from from group home due to "being toxic and not acting like myself".   Caregiver reports that she has been off the last few days and when she came back to work today patient was not acting like himself, caregiver reports that patient was noted to be dozing off in the Circle, not speaking as much as normal, reports had lithium levels checked this morning.   Pt states he feels okay, pt calm and cooperative at this time, pt noted to be intermittently falling asleep in triage.

## 2019-12-21 NOTE — ED Provider Notes (Signed)
[facility] Emergency Department Provider Note ____________________________________________   Event Date/Time   First MD Initiated Contact with Patient 12/21/19 1624     (approximate)  I have reviewed the triage vital signs and the nursing notes.  HISTORY  Chief Complaint No chief complaint on file.   HPI John Jefferys. is a 44 y.o. John [name] presents to the ED for evaluation of altered mentation.  Chart review indicates obesity, HTN, HLD, DM on oral agents, GERD.  Paranoid schizophrenia.  Patient presents to the ED voluntarily with concerns of altered mentation.  Patient reports that John [name], his primary caregiver for multiple years, returned from out of town today and reported concerns that patient was not acting himself in a nonspecific manner.  She reports that he is sleepier than normal, and reports concern for lithium toxicity, because he has had this in the past.  Patient reports that he feels fine and is uncertain of the concerns that John [name] expresses.  He does report some intermittent sharp pains to his left side, transiently relieved with home naproxen.  He denies any fevers, emesis, diarrhea, dysuria, trauma, chest pain or pleuritic pains.   Past Medical History:  Diagnosis Date  . Anemia   . Asthma   . Diabetes mellitus   . 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 Morbid obesity ([place])   . Paranoid schizophrenia ([place])   . Personality disorder ([place])   . Sleep apnea   . Thyroid disease     Patient Active Problem List   Diagnosis Date Noted  . Bilateral leg weakness 08/17/2019  . Knee arthropathy 08/10/2019  . Acute gout due to renal impairment involving right foot 07/08/2019  . Encephalopathy acute   . Somnolence 11/01/2016  . AKI (acute kidney injury) ([place]) 11/01/2016  . Sepsis ([place]) 04/27/2016  . Respiratory failure ([place]) 12/20/2015  . Myocardiopathy ([place]) 11/28/2015  . Tobacco abuse 11/28/2015  . Acute  respiratory failure with hypercapnia ([place])   . Sleep apnea   . Essential hypertension 11/25/2015  . Hypothyroidism 11/25/2015  . Paranoid schizophrenia, chronic condition ([place]) 11/25/2015  . Acute respiratory failure ([place])   . Acute exacerbation of COPD with asthma ([place])   . Diabetes mellitus ([place])   . Diabetes mellitus due to underlying condition without complication, without long-term current use of insulin ([place])   . Morbid obesity ([place]) 09/29/2008    Past Surgical History:  Procedure Laterality Date  . FRACTURE SURGERY      Prior to Admission medications   Medication Sig Start Date End Date Taking? Authorizing Provider  Accu-Chek Softclix Lancets lancets Use as instructed 09/24/19   Cletis Athens, MD  acetaminophen (TYLENOL) 500 MG tablet Take by mouth.    [provider]  albuterol (PROVENTIL) (2.5 MG/3ML) 0.083% nebulizer solution USE 1 VIAL VIA NEBULIZER 3 TIMES A DAY 12/07/19   Cletis Athens, MD  Alcohol Swabs (B-D SINGLE USE SWABS REGULAR) PADS USE AS DIRECTED FOR BLOOD SUGAR CHECKS. 08/20/19   Cletis Athens, MD  ALLERGY RELIEF 10 MG tablet TAKE ONE TABLET BY MOUTH EVERY DAY 08/20/19   Cletis Athens, MD  asenapine (SAPHRIS) 5 MG SUBL 24 hr tablet Place 1 tablet (5 mg total) 2 (two) times daily under the tongue. 11/13/16   Bettey Costa, MD  atenolol (TENORMIN) 100 MG tablet Take 1 tablet (100 mg total) by mouth daily. 09/24/19   Cletis Athens, MD  atorvastatin (LIPITOR) 10 MG tablet TAKE 1 TABLET BY MOUTH EACH DAY.  08/20/19   Cletis Athens, MD  BREO ELLIPTA 100-25 MCG/INH AEPB INHALE 1 PUFF BY MOUTH EVERY DAY 08/20/19   Cletis Athens, MD  cloZAPine (CLOZARIL) 100 MG tablet Take 100 mg by mouth daily.    [provider]  divalproex (DEPAKOTE ER) 500 MG 24 hr tablet Take 2,000 mg by mouth at bedtime.     [provider]  furosemide (LASIX) 20 MG tablet TAKE 3 TABLETS (60MG ) BY MOUTH ONCE DAILY 06/11/19   Cletis Athens, MD  glucose blood (TRUE METRIX BLOOD GLUCOSE  TEST) test strip FINGERSTICK BLOOD SUGAR ONCE DAILY 10/14/19   Cletis Athens, MD  haloperidol (HALDOL) 5 MG tablet Take 5 mg by mouth 2 (two) times daily.     [provider]  ibuprofen (ADVIL,MOTRIN) 600 MG tablet Take 1 tablet (600 mg total) by mouth every 6 (six) hours as needed (foot pain). 01/04/17   Orbie Pyo, MD  levothyroxine (SYNTHROID) 75 MCG tablet TAKE 1 TABLET BY MOUTH EACH DAY. 08/20/19   Cletis Athens, MD  losartan (COZAAR) 50 MG tablet TAKE 1 TABLET BY MOUTH EACH DAY. *IF BLOOD PRESSURE IS LESS THAN 100/70 DONT GIVE  MEDICATION* 08/20/19   Cletis Athens, MD  metFORMIN (GLUCOPHAGE) 1000 MG tablet TAKE 1 TABLET BY MOUTH TWICE A DAY WITH FOOD 10/16/19   Beckie Salts, FNP  metFORMIN (GLUCOPHAGE) 500 MG tablet Take 1 tablet (500 mg total) by mouth 2 (two) times daily with a meal. 09/24/19 10/24/19  Cletis Athens, MD  naproxen (NAPROSYN) 500 MG tablet Take 1 tablet (500 mg total) by mouth 2 (two) times daily with a meal. 11/30/19   Paulette Blanch, MD  omeprazole (PRILOSEC) 20 MG capsule Take 1 capsule (20 mg total) by mouth daily. 07/07/19   Cletis Athens, MD  oxybutynin (DITROPAN) 5 MG tablet TAKE 1 TABLET BY MOUTH ONCE DAILY 08/06/19   Cletis Athens, MD  Semaglutide,0.25 or 0.5MG /DOS, (OZEMPIC, 0.25 OR 0.5 MG/DOSE,) 2 MG/1.5ML SOPN Inject 0.5 mg into the skin once a week. 10/14/19   Cletis Athens, MD  sitaGLIPtin (JANUVIA) 100 MG tablet Take 1 tablet (100 mg total) by mouth daily. 09/24/19   Cletis Athens, MD    Allergies Patient has no known allergies.  Family History  Problem Relation Age of Onset  . Glaucoma Mother     Social History Social History   Tobacco Use  . Smoking status: Former Research scientist (life sciences)  . Smokeless tobacco: Never Used  Vaping Use  . Vaping Use: Never used  Substance Use Topics  . Alcohol use: Not Currently    Comment: only once in a while  . Drug use: No    Review of Systems  Constitutional: No fever/chills.  Positive for generalized altered  behavior and daytime sleepiness. Eyes: No visual changes. ENT: No sore throat. Cardiovascular: Denies chest pain. Respiratory: Denies shortness of breath. Gastrointestinal:   No nausea, no vomiting.  No diarrhea.  No constipation. Positive for left-sided abdominal pain. Genitourinary: Negative for dysuria. Musculoskeletal: Negative for back pain. Skin: Negative for rash. Neurological: Negative for headaches, focal weakness or numbness.  ____________________________________________   PHYSICAL EXAM:  VITAL SIGNS: Vitals:   12/21/19 1510 12/21/19 1910  BP: 105/81 104/73  Pulse: (!) 121 99  Resp: 18 18  Temp:    SpO2: 98% 98%     Constitutional: Alert and oriented. Well appearing and in no acute distress.  Obese.  Asleep, awakens to my light touch and remains awake throughout my conversation and evaluation. Eyes: Conjunctivae are  normal. PERRL. EOMI. Head: Atraumatic. Nose: No congestion/rhinnorhea. Mouth/Throat: Mucous membranes are moist.  Oropharynx non-erythematous. Neck: No stridor. No cervical spine tenderness to palpation. Cardiovascular: Normal rate, regular rhythm. Grossly normal heart sounds.  Good peripheral circulation. Respiratory: Normal respiratory effort.  No retractions. Lungs CTAB. Gastrointestinal: Soft , nondistended, nontender to palpation. No CVA tenderness.  Soft and benign throughout. Musculoskeletal: No lower extremity tenderness nor edema.  No joint effusions. No signs of acute trauma. Neurologic:  Normal speech and language. No gross focal neurologic deficits are appreciated. No gait instability noted. Cranial nerves II through XII intact 5/5 strength and sensation in all 4 extremities Skin:  Skin is warm, dry and intact. No rash noted. Psychiatric: Mood and affect are normal. Speech and behavior are normal.  ____________________________________________   LABS (all labs ordered are listed, but only abnormal results are displayed)  Labs Reviewed   COMPREHENSIVE METABOLIC PANEL - Abnormal; Notable for the following components:      Result Value   Glucose, Bld 106 (*)    Total Protein 9.0 (*)    AST 13 (*)    All other components within normal limits  SALICYLATE LEVEL - Abnormal; Notable for the following components:   Salicylate Lvl <8.1 (*)    All other components within normal limits  ACETAMINOPHEN LEVEL - Abnormal; Notable for the following components:   Acetaminophen (Tylenol), Serum <10 (*)    All other components within normal limits  CBC - Abnormal; Notable for the following components:   Hemoglobin 12.3 (*)    MCHC 29.5 (*)    RDW 16.4 (*)    All other components within normal limits  LITHIUM LEVEL - Abnormal; Notable for the following components:   Lithium Lvl <0.06 (*)    All other components within normal limits  ETHANOL  URINE DRUG SCREEN, QUALITATIVE ([facility] ONLY)   ____________________________________________  12 Lead EKG  Rhythm, rate of 116 bpm.  Rightward axis.  Normal intervals.  No evidence of acute ischemia.  Sinus tachycardia. ____________________________________________  RADIOLOGY  ED MD interpretation: CT reviewed by me without evidence of acute intra-abdominal pathology.  Official radiology report(s): CT ABDOMEN PELVIS W CONTRAST  Result Date: 12/21/2019 CLINICAL DATA:  Abdominal pain. EXAM: CT ABDOMEN AND PELVIS WITH CONTRAST TECHNIQUE: Multidetector CT imaging of the abdomen and pelvis was performed using the standard protocol following bolus administration of intravenous contrast. CONTRAST:  154mL OMNIPAQUE IOHEXOL 300 MG/ML  SOLN COMPARISON:  CT dated November 29, 2019 FINDINGS: Lower chest: There are trace bilateral pleural effusions.The heart size is normal. Hepatobiliary: The liver is normal. Normal gallbladder.There is no biliary ductal dilation. Pancreas: Normal contours without ductal dilatation. No peripancreatic fluid collection. Spleen: Unremarkable. Adrenals/Urinary Tract: --Adrenal  glands: Unremarkable. --Right kidney/ureter: No hydronephrosis or radiopaque kidney stones. --Left kidney/ureter: No hydronephrosis or radiopaque kidney stones. --Urinary bladder: Unremarkable. Stomach/Bowel: --Stomach/Duodenum: No hiatal hernia or other gastric abnormality. Normal duodenal course and caliber. --Small bowel: Unremarkable. --Colon: Unremarkable. --Appendix: Normal. Vascular/Lymphatic: Normal course and caliber of the major abdominal vessels. --No retroperitoneal lymphadenopathy. --No mesenteric lymphadenopathy. --No pelvic or inguinal lymphadenopathy. Reproductive: Unremarkable Other: The previously demonstrated small volume free fluid in the patient's pelvis has essentially resolved. The abdominal wall is normal. Musculoskeletal. Again noted is a stable sclerotic lesion in the L1 vertebral body. IMPRESSION: 1. No acute abdominopelvic abnormality. 2. Trace bilateral pleural effusions. Electronically Signed   By: Constance Holster M.D.   On: 12/21/2019 18:10    ____________________________________________   PROCEDURES and INTERVENTIONS  Procedure(s) performed (  including Critical Care):  Procedures  Medications  iohexol (OMNIPAQUE) 300 MG/ML solution 125 mL (125 mLs Intravenous Contrast Given 12/21/19 1743)    ____________________________________________   MDM / ED COURSE   Pleasant 44 year old gentleman with schizophrenia presents to the ED with concerns for generalized altered mentation, without evidence of acute pathology, and amenable to return to his group home.  Patient presented tachycardic, this self resolves, and his vitals otherwise normal on room air.  Exam is generally reassuring demonstrating a sleepy patient, who awakens throughout plan evaluation.  He has no evidence of distress or neurovascular deficits.  Blood work is benign and lithium level is actually low without signs of toxicity.  CT imaging obtained of his abdomen/pelvis due to his complaints of pain and the  possibility of an accurate history and physical due to his schizophrenia and sleepiness, this results without evidence of acute pathology.  Further discussed the care with patient's caregiver, who reports he has a history of sleep apnea and we discussed the possibility of inadequate usage of his CPAP can easily cause daytime sleepiness.  We discussed return precautions for the ED and patient is medically stable for discharge home.   Clinical Course as of 12/21/19 1942  Mon Dec 21, 2019  [address] [facility], caregiver,  He's been toxic before, lithium and clozapine.  2 days of acting weird, sleepy, not himself. She's been working with the patient for 4 year.  Saying his side hurt, better with naproxen.  [DS]  1934 Called John [name] back,  We discussed benign work-up and no evidence of acute pathology.  She reports that he is a history of OSA and is concerned that he has not been using his CPAP at night or not using it appropriately.  We discussed that this could certainly cause of sleepiness during the day.  She reports that she is on the way to hospital to see him and take him home, which I think is reasonable. [DS]    Clinical Course User Index [DS] Vladimir Crofts, MD    ____________________________________________   FINAL CLINICAL IMPRESSION(S) / ED DIAGNOSES  Final diagnoses:  Sleep apnea in adult  Excessive daytime sleepiness     ED Discharge Orders    None       Ciel Chervenak Tamala Julian   Note:  This document was prepared using Dragon voice recognition software and may include unintentional dictation errors.   Vladimir Crofts, MD 12/21/19 1946

## 2019-12-22 ENCOUNTER — Other Ambulatory Visit: Payer: Self-pay

## 2019-12-22 ENCOUNTER — Emergency Department: Payer: 59

## 2019-12-22 ENCOUNTER — Encounter: Payer: Self-pay | Admitting: Emergency Medicine

## 2019-12-22 ENCOUNTER — Ambulatory Visit: Payer: 59 | Admitting: Internal Medicine

## 2019-12-22 ENCOUNTER — Emergency Department
Admission: EM | Admit: 2019-12-22 | Discharge: 2019-12-22 | Disposition: A | Discharge status: Home / Self Care | Payer: 59 | Attending: Emergency Medicine | Admitting: Emergency Medicine

## 2019-12-22 DIAGNOSIS — E039 Hypothyroidism, unspecified: Secondary | ICD-10-CM | POA: Diagnosis not present

## 2019-12-22 DIAGNOSIS — S8001XA Contusion of right knee, initial encounter: Secondary | ICD-10-CM | POA: Diagnosis not present

## 2019-12-22 DIAGNOSIS — W182XXA Fall in (into) shower or empty bathtub, initial encounter: Secondary | ICD-10-CM | POA: Diagnosis not present

## 2019-12-22 DIAGNOSIS — Y92002 Bathroom of unspecified non-institutional (private) residence single-family (private) house as the place of occurrence of the external cause: Secondary | ICD-10-CM | POA: Diagnosis not present

## 2019-12-22 DIAGNOSIS — J441 Chronic obstructive pulmonary disease with (acute) exacerbation: Secondary | ICD-10-CM | POA: Diagnosis not present

## 2019-12-22 DIAGNOSIS — I10 Essential (primary) hypertension: Secondary | ICD-10-CM | POA: Insufficient documentation

## 2019-12-22 DIAGNOSIS — Z7984 Long term (current) use of oral hypoglycemic drugs: Secondary | ICD-10-CM | POA: Diagnosis not present

## 2019-12-22 DIAGNOSIS — J45909 Unspecified asthma, uncomplicated: Secondary | ICD-10-CM | POA: Diagnosis not present

## 2019-12-22 DIAGNOSIS — R109 Unspecified abdominal pain: Secondary | ICD-10-CM | POA: Diagnosis not present

## 2019-12-22 DIAGNOSIS — Z87891 Personal history of nicotine dependence: Secondary | ICD-10-CM | POA: Insufficient documentation

## 2019-12-22 DIAGNOSIS — Z79899 Other long term (current) drug therapy: Secondary | ICD-10-CM | POA: Insufficient documentation

## 2019-12-22 DIAGNOSIS — E119 Type 2 diabetes mellitus without complications: Secondary | ICD-10-CM | POA: Diagnosis not present

## 2019-12-22 DIAGNOSIS — R55 Syncope and collapse: Secondary | ICD-10-CM | POA: Insufficient documentation

## 2019-12-22 DIAGNOSIS — S8991XA Unspecified injury of right lower leg, initial encounter: Secondary | ICD-10-CM | POA: Diagnosis present

## 2019-12-22 DIAGNOSIS — Y93E1 Activity, personal bathing and showering: Secondary | ICD-10-CM | POA: Diagnosis not present

## 2019-12-22 LAB — BASIC METABOLIC PANEL
Anion gap: 11 (ref 5–15)
BUN: 15 mg/dL (ref 6–20)
CO2: 28 mmol/L (ref 22–32)
Calcium: 9.1 mg/dL (ref 8.9–10.3)
Chloride: 99 mmol/L (ref 98–111)
Creatinine, Ser: 0.88 mg/dL (ref 0.61–1.24)
GFR, Estimated: >60 mL/min (ref >60)
Glucose, Bld: 129 mg/dL — ABNORMAL HIGH (ref 70–99)
Potassium: 4.8 mmol/L (ref 3.5–5.1)
Sodium: 138 mmol/L (ref 135–145)

## 2019-12-22 LAB — CBC
HCT: 41.6 % (ref 39.0–52.0)
Hemoglobin: 12.6 g/dL — ABNORMAL LOW (ref 13.0–17.0)
MCH: 26.4 pg (ref 26.0–34.0)
MCHC: 30.3 g/dL (ref 30.0–36.0)
MCV: 87.2 fL (ref 80.0–100.0)
Platelets: 164 10*3/uL (ref 150–400)
RBC: 4.77 MIL/uL (ref 4.22–5.81)
RDW: 16.4 % — ABNORMAL HIGH (ref 11.5–15.5)
WBC: 9.4 10*3/uL (ref 4.0–10.5)
nRBC: 0 % (ref 0.0–0.2)

## 2019-12-22 LAB — CBG MONITORING, ED: Glucose-Capillary: 97 mg/dL (ref 70–99)

## 2019-12-22 MED ORDER — IBUPROFEN 800 MG PO TABS
800.0000 mg | ORAL_TABLET | ORAL | Status: AC
  Administered 2019-12-22: 22:00:00 800 mg via ORAL
  Filled 2019-12-22: qty 1

## 2019-12-22 NOTE — ED Notes (Addendum)
Pt given sandwich tray and drink per OK from MD Quale.

## 2019-12-22 NOTE — ED Triage Notes (Signed)
Pt in via [facility] from [facility]; reports getting dizzy while in shower, falling out of shower, hitting head on tile floor.  Pt denies LOC, denies blood thinners.  Pt A/Ox4, vitals WDL, NAD noted at this time.

## 2019-12-22 NOTE — ED Notes (Addendum)
Per patients mother, John [name], no guardianship is in place; patient is able to make own decisions.  Active FYI removed per this RN.

## 2019-12-22 NOTE — ED Provider Notes (Signed)
[facility] Emergency Department Provider Note   ____________________________________________   Event Date/Time   First MD Initiated Contact with Patient 12/22/19 2113     (approximate)  I have reviewed the triage vital signs and the nursing notes.   HISTORY  Chief Complaint Dizziness and Fall    HPI John [name]. is a 44 y.o. male history of schizophrenia, sleep apnea asthma diabetes  Patient presents today, mom states he was taking a shower this morning when he got lightheaded felt like he was going to pass out and then fell in the shower.  Reports he did strike his head and banged his right knee on the edge of the shower.  Reports he felt very lightheaded may have passed out briefly  He reports he has had symptoms like this before, and he was in the shower felt lightheaded and a hot sensation and then felt like he was going to pass out and up on the floor  Denies headache.  No neck pain.  No fevers or chills.  Was at the ER yesterday for concerns that he needs to use his CPAP  No trouble breathing.  Reports his right knee is sore over the inner surface near the kneecap.  Still able to move it but does feel sore.  No other injuries except he reports just a little bit of achy along his left flank    Past Medical History:  Diagnosis Date  . Anemia   . Asthma   . Diabetes mellitus   . 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 Morbid obesity ([place])   . Paranoid schizophrenia ([place])   . Personality disorder ([place])   . Sleep apnea   . Thyroid disease     Patient Active Problem List   Diagnosis Date Noted  . Bilateral leg weakness 08/17/2019  . Knee arthropathy 08/10/2019  . Acute gout due to renal impairment involving right foot 07/08/2019  . Encephalopathy acute   . Somnolence 11/01/2016  . AKI (acute kidney injury) ([place]) 11/01/2016  . Sepsis ([place]) 04/27/2016  . Respiratory failure ([place]) 12/20/2015  . Myocardiopathy  ([place]) 11/28/2015  . Tobacco abuse 11/28/2015  . Acute respiratory failure with hypercapnia ([place])   . Sleep apnea   . Essential hypertension 11/25/2015  . Hypothyroidism 11/25/2015  . Paranoid schizophrenia, chronic condition ([place]) 11/25/2015  . Acute respiratory failure ([place])   . Acute exacerbation of COPD with asthma ([place])   . Diabetes mellitus ([place])   . Diabetes mellitus due to underlying condition without complication, without long-term current use of insulin ([place])   . Morbid obesity ([place]) 09/29/2008    Past Surgical History:  Procedure Laterality Date  . FRACTURE SURGERY      Prior to Admission medications   Medication Sig Start Date End Date Taking? Authorizing Provider  Accu-Chek Softclix Lancets lancets Use as instructed 09/24/19   Cletis Athens, MD  acetaminophen (TYLENOL) 500 MG tablet Take by mouth.    [provider]  albuterol (PROVENTIL) (2.5 MG/3ML) 0.083% nebulizer solution USE 1 VIAL VIA NEBULIZER 3 TIMES A DAY 12/07/19   Cletis Athens, MD  Alcohol Swabs (B-D SINGLE USE SWABS REGULAR) PADS USE AS DIRECTED FOR BLOOD SUGAR CHECKS. 08/20/19   Cletis Athens, MD  ALLERGY RELIEF 10 MG tablet TAKE ONE TABLET BY MOUTH EVERY DAY 08/20/19   Cletis Athens, MD  asenapine (SAPHRIS) 5 MG SUBL 24 hr tablet Place 1 tablet (5 mg total) 2 (two) times daily  under the tongue. 11/13/16   Bettey Costa, MD  atenolol (TENORMIN) 100 MG tablet Take 1 tablet (100 mg total) by mouth daily. 09/24/19   Cletis Athens, MD  atorvastatin (LIPITOR) 10 MG tablet TAKE 1 TABLET BY MOUTH EACH DAY. 08/20/19   Masoud, Viann Shove, MD  BREO ELLIPTA 100-25 MCG/INH AEPB INHALE 1 PUFF BY MOUTH EVERY DAY 08/20/19   Cletis Athens, MD  cloZAPine (CLOZARIL) 100 MG tablet Take 100 mg by mouth daily.    [provider]  divalproex (DEPAKOTE ER) 500 MG 24 hr tablet Take 2,000 mg by mouth at bedtime.     [provider]  furosemide (LASIX) 20 MG tablet TAKE 3 TABLETS (60MG ) BY MOUTH ONCE DAILY 06/11/19    Cletis Athens, MD  glucose blood (TRUE METRIX BLOOD GLUCOSE TEST) test strip FINGERSTICK BLOOD SUGAR ONCE DAILY 10/14/19   Cletis Athens, MD  haloperidol (HALDOL) 5 MG tablet Take 5 mg by mouth 2 (two) times daily.     [provider]  ibuprofen (ADVIL,MOTRIN) 600 MG tablet Take 1 tablet (600 mg total) by mouth every 6 (six) hours as needed (foot pain). 01/04/17   Orbie Pyo, MD  levothyroxine (SYNTHROID) 75 MCG tablet TAKE 1 TABLET BY MOUTH EACH DAY. 08/20/19   Cletis Athens, MD  losartan (COZAAR) 50 MG tablet TAKE 1 TABLET BY MOUTH EACH DAY. *IF BLOOD PRESSURE IS LESS THAN 100/70 DONT GIVE  MEDICATION* 08/20/19   Cletis Athens, MD  metFORMIN (GLUCOPHAGE) 1000 MG tablet TAKE 1 TABLET BY MOUTH TWICE A DAY WITH FOOD 10/16/19   Beckie Salts, FNP  metFORMIN (GLUCOPHAGE) 500 MG tablet Take 1 tablet (500 mg total) by mouth 2 (two) times daily with a meal. 09/24/19 10/24/19  Cletis Athens, MD  naproxen (NAPROSYN) 500 MG tablet Take 1 tablet (500 mg total) by mouth 2 (two) times daily with a meal. 11/30/19   Paulette Blanch, MD  omeprazole (PRILOSEC) 20 MG capsule Take 1 capsule (20 mg total) by mouth daily. 07/07/19   Cletis Athens, MD  oxybutynin (DITROPAN) 5 MG tablet TAKE 1 TABLET BY MOUTH ONCE DAILY 08/06/19   Cletis Athens, MD  Semaglutide,0.25 or 0.5MG /DOS, (OZEMPIC, 0.25 OR 0.5 MG/DOSE,) 2 MG/1.5ML SOPN Inject 0.5 mg into the skin once a week. 10/14/19   Cletis Athens, MD  sitaGLIPtin (JANUVIA) 100 MG tablet Take 1 tablet (100 mg total) by mouth daily. 09/24/19   Cletis Athens, MD    Allergies Patient has no known allergies.  Family History  Problem Relation Age of Onset  . Glaucoma Mother     Social History Social History   Tobacco Use  . Smoking status: Former Research scientist (life sciences)  . Smokeless tobacco: Never Used  Vaping Use  . Vaping Use: Never used  Substance Use Topics  . Alcohol use: Not Currently  . Drug use: No    Review of Systems Constitutional: No fever/chills or recent  illness (but was seen in the ER yesterday told to keep using his CPAP) Eyes: No visual changes. ENT: No sore throat.  No neck pain Cardiovascular: Denies chest pain. Respiratory: Denies shortness of breath. Gastrointestinal: No abdominal pain.   Genitourinary: Negative for dysuria. Musculoskeletal: Negative for back pain.  Reports some discomfort with range of motion the right knee Skin: Negative for rash. Neurological: Negative for headaches, areas of focal weakness or numbness.   Of note, nurse called and spoke with the patient's mother who is his reported guardian for the patient ____________________________________________   PHYSICAL EXAM:  VITAL  SIGNS: ED Triage Vitals  Enc Vitals Group     BP 12/22/19 1423 102/78     Pulse Rate 12/22/19 1423 97     Resp 12/22/19 1423 20     Temp 12/22/19 1423 98.7 F (37.1 C)     Temp Source 12/22/19 1423 Oral     SpO2 12/22/19 1423 97 %     Weight 12/22/19 1424 300 lb (136.1 kg)     Height 12/22/19 1424 6' (1.829 m)     Head Circumference --      Peak Flow --      Pain Score 12/22/19 1423 3     Pain Loc --      Pain Edu? --      Excl. in GC? --     Constitutional: Alert and oriented. Well appearing and in no acute distress.  He is pleasant seated in the hallway without distress. Eyes: Conjunctivae are normal. Head: Atraumatic. Nose: No congestion/rhinnorhea. Mouth/Throat: Mucous membranes are moist. Neck: No stridor.  Cardiovascular: Normal rate, regular rhythm. Grossly normal heart sounds.  Good peripheral circulation. Respiratory: Normal respiratory effort.  No retractions. Lungs CTAB. Gastrointestinal: Soft and nontender. No distention. Musculoskeletal:   RIGHT Right upper extremity demonstrates normal strength, good use of all muscles. No edema bruising or contusions of the right shoulder/upper arm, right elbow, right forearm / hand. Full range of motion of the right right upper extremity without pain. No evidence of  trauma. Strong radial pulse. Intact median/ulnar/radial neuro-muscular exam.  LEFT Left upper extremity demonstrates normal strength, good use of all muscles. No edema bruising or contusions of the left shoulder/upper arm, left elbow, left forearm / hand. Full range of motion of the left  upper extremity without pain. No evidence of trauma. Strong radial pulse. Intact median/ulnar/radial neuro-muscular exam.  Lower Extremities  No edema. Normal DP/PT pulses bilateral with good cap refill.  Normal neuro-motor function lower extremities bilateral.  RIGHT Right lower extremity demonstrates normal strength, good use of all muscles but some limitation with flexion extension of the knee reporting some discomfort along the medial surface near the kneecap but no deformity bruising or overlying edema or erythema. No edema bruising or contusions of the right hip, right knee, right ankle. Full range of motion of the right lower extremity some discomfort to range of motion of the knee without obvious deformity or joint laxity. No pain on axial loading. No evidence of trauma.  No pain through range of motion of the hip  LEFT Left lower extremity demonstrates normal strength, good use of all muscles. No edema bruising or contusions of the hip,  knee, ankle. Full range of motion of the left lower extremity without pain. No pain on axial loading. No evidence of trauma.   Neurologic:  Normal speech and language. No gross focal neurologic deficits are appreciated.  Skin:  Skin is warm, dry and intact. No rash noted. Psychiatric: Mood and affect are normal. Speech and behavior are normal.  ____________________________________________   LABS (all labs ordered are listed, but only abnormal results are displayed)  Labs Reviewed  BASIC METABOLIC PANEL - Abnormal; Notable for the following components:      Result Value   Glucose, Bld 129 (*)    All other components within normal limits  CBC - Abnormal;  Notable for the following components:   Hemoglobin 12.6 (*)    RDW 16.4 (*)    All other components within normal limits  CBG MONITORING, ED  CBG MONITORING, ED  ____________________________________________  EKG  Reviewed interpreted by me at 1440 Heart rate 95 QRS 86 QTc 430 Normal sinus rhythm, slight artifact no evidence of acute ischemia  No ectopy or prolongation of the QT interval ____________________________________________  RADIOLOGY  CT Head Wo Contrast  Result Date: 12/22/2019 CLINICAL DATA:  Head trauma. Getting dizzy while in shower, falling out of shower, hitting head on tile floor. Pt denies LOC, denies blood thinners. EXAM: CT HEAD WITHOUT CONTRAST TECHNIQUE: Contiguous axial images were obtained from the base of the skull through the vertex without intravenous contrast. COMPARISON:  CT head 11/01/2016 FINDINGS: Brain: No evidence of large-territorial acute infarction. No parenchymal hemorrhage. No mass lesion. No extra-axial collection. No mass effect or midline shift. No hydrocephalus. Basilar cisterns are patent. Vascular: No hyperdense vessel. Skull: No acute fracture or focal lesion. Sinuses/Orbits: Paranasal sinuses and mastoid air cells are clear. The orbits are unremarkable. Other: None. IMPRESSION: No acute intracranial abnormality. Electronically Signed   By: Tish Frederickson M.D.   On: 12/22/2019 22:11   DG Knee Complete 4 Views Right  Result Date: 12/22/2019 CLINICAL DATA:  Pain status post fall on knee. EXAM: RIGHT KNEE - COMPLETE 4+ VIEW COMPARISON:  12/21/2015 FINDINGS: There are mild tricompartmental degenerative changes. There is a bipartite patella, similar to prior study. There is no large pleural effusion. There is no acute displaced fracture or dislocation. IMPRESSION: Negative. Electronically Signed   By: Katherine Mantle M.D.   On: 12/22/2019 21:59    CT head negative for acute intracranial hemorrhage.  Right knee imaging negative for acute  fracture reviewed by me ____________________________________________   PROCEDURES  Procedure(s) performed: None  Procedures  Critical Care performed: No  ____________________________________________   INITIAL IMPRESSION / ASSESSMENT AND PLAN / ED COURSE  Pertinent labs & imaging results that were available during my care of the patient were reviewed by me and considered in my medical decision making (see chart for details).   Patient presents for concerns of right knee pain after a syncopal episode.  Clinical history seems to suggest most likely orthostatic or vasovagal type syncope.  He appears well-hydrated in no acute distress with reassuring lab testing including CBC and metabolic panel.  He does report a head strike but is felt to be low risk for intracranial hemorrhage but given the patient's history will obtain a CT of the head to exclude interval cranial injury, also low suspicion for acute bony injury or severe disability of the right knee joint, but will obtain imaging and provide NSAID.  He was worked up yesterday for altered mental status thought to be likely due to noncompliance with CPAP while his caretaker was out of town  He is awake alert well oriented in no distress at this time.  Shows no evidence of somnolence  ----------------------------------------- 10:20 PM on 12/22/2019 -----------------------------------------  Patient up ambulating without distress.  Nurse reaching out to patient's guardian, and group home will be coming to pick him up.  He reports he feels improved resting comfortably without distress.  Awake and alert and able to ambulate without difficulty or symptoms.  Appropriate for discharge.    Return precautions and treatment recommendations and follow-up discussed with the patient who is agreeable with the plan.       ____________________________________________   FINAL CLINICAL IMPRESSION(S) / ED DIAGNOSES  Final diagnoses:  Syncope,  vasovagal  Contusion of right knee, initial encounter        Note:  This document was prepared using Dragon voice recognition software  and may include unintentional dictation errors       Sharyn Creamer, MD 12/22/19 2225

## 2019-12-22 NOTE — ED Notes (Signed)
Mother, Norlene Campbell and [facility] group [facility] called and informed of pt's diagnoses and DC.  Both parties verbalized understanding and pt will be picked up by group home staff.

## 2019-12-29 ENCOUNTER — Emergency Department: Payer: 59

## 2019-12-29 ENCOUNTER — Other Ambulatory Visit: Payer: Self-pay

## 2019-12-29 ENCOUNTER — Emergency Department
Admission: EM | Admit: 2019-12-29 | Discharge: 2019-12-29 | Disposition: A | Discharge status: Home / Self Care | Payer: 59 | Attending: Emergency Medicine | Admitting: Emergency Medicine

## 2019-12-29 DIAGNOSIS — W19XXXA Unspecified fall, initial encounter: Secondary | ICD-10-CM | POA: Insufficient documentation

## 2019-12-29 DIAGNOSIS — I10 Essential (primary) hypertension: Secondary | ICD-10-CM | POA: Diagnosis not present

## 2019-12-29 DIAGNOSIS — Z7984 Long term (current) use of oral hypoglycemic drugs: Secondary | ICD-10-CM | POA: Insufficient documentation

## 2019-12-29 DIAGNOSIS — E119 Type 2 diabetes mellitus without complications: Secondary | ICD-10-CM | POA: Diagnosis not present

## 2019-12-29 DIAGNOSIS — E039 Hypothyroidism, unspecified: Secondary | ICD-10-CM | POA: Diagnosis not present

## 2019-12-29 DIAGNOSIS — S63641A Sprain of metacarpophalangeal joint of right thumb, initial encounter: Secondary | ICD-10-CM

## 2019-12-29 DIAGNOSIS — Z79899 Other long term (current) drug therapy: Secondary | ICD-10-CM | POA: Diagnosis not present

## 2019-12-29 DIAGNOSIS — S60931A Unspecified superficial injury of right thumb, initial encounter: Secondary | ICD-10-CM | POA: Diagnosis present

## 2019-12-29 DIAGNOSIS — Z87891 Personal history of nicotine dependence: Secondary | ICD-10-CM | POA: Diagnosis not present

## 2019-12-29 DIAGNOSIS — J45909 Unspecified asthma, uncomplicated: Secondary | ICD-10-CM | POA: Diagnosis not present

## 2019-12-29 MED ORDER — MELOXICAM 15 MG PO TABS: 15.0000 mg | ORAL_TABLET | Freq: Every day | ORAL | 0 refills | Status: DC

## 2019-12-29 NOTE — ED Triage Notes (Signed)
Pt states that he fell last Thursday and landed on his right hand. Pt states that he broke his right thumb in 1997 and doesn't think it healed correctly.

## 2019-12-29 NOTE — ED Notes (Signed)
Pt signed esignature.  D/c  inst to pt.  

## 2019-12-29 NOTE — ED Provider Notes (Signed)
[facility] Emergency Department Provider Note  ____________________________________________  Time seen: Approximately 4:10 PM  I have reviewed the triage vital signs and the nursing notes.   HISTORY  Chief Complaint Hand Pain (Right thumb pain)    HPI John [name]. is a 44 y.o. male who presents the emergency department complaining of right thumb pain at the level of the MCP joint.  Patient states that he sustained a fall where he landed awkwardly on his hand.  Patient has been having pain to the dorsal aspect of the thumb since.  He states that he fractured it many years ago, is not sure if it ever appropriately healed but he had not had any ongoing issues until falling again.  He sustained no other injuries during this fall and is only concerned about his thumb at this time.  He still able to flex and extend the thumb appropriately.  No open wounds.  No medications prior to arrival.  Medical history as described below with no complaints of chronic medical issues.         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 tophus 07/20/2019   Morbid obesity ([place])    Paranoid schizophrenia ([place])    Personality disorder ([place])    Sleep apnea    Thyroid disease     Patient Active Problem List   Diagnosis Date Noted   Bilateral leg weakness 08/17/2019   Knee arthropathy 08/10/2019   Acute gout due to renal impairment involving right foot 07/08/2019   Encephalopathy acute    Somnolence 11/01/2016   AKI (acute kidney injury) ([place]) 11/01/2016   Sepsis ([place]) 04/27/2016   Respiratory failure ([place]) 12/20/2015   Myocardiopathy ([place]) 11/28/2015   Tobacco abuse 11/28/2015   Acute respiratory failure with hypercapnia ([place])    Sleep apnea    Essential hypertension 11/25/2015   Hypothyroidism 11/25/2015   Paranoid schizophrenia, chronic condition ([place])  11/25/2015   Acute respiratory failure ([name])    Acute exacerbation of COPD with asthma ([place])    Diabetes mellitus ([place])    Diabetes mellitus due to underlying condition without complication, without long-term current use of insulin ([place])    Morbid obesity ([place]) 09/29/2008    Past Surgical History:  Procedure Laterality Date   FRACTURE SURGERY      Prior to Admission medications   Medication Sig Start Date End Date Taking? Authorizing Provider  meloxicam (MOBIC) 15 MG tablet Take 1 tablet (15 mg total) by mouth daily. 12/29/19  Yes Anglia Blakley, Charline Bills, PA-C  Accu-Chek Softclix Lancets lancets Use as instructed 09/24/19   Cletis Athens, MD  acetaminophen (TYLENOL) 500 MG tablet Take by mouth.    [provider]  albuterol (PROVENTIL) (2.5 MG/3ML) 0.083% nebulizer solution USE 1 VIAL VIA NEBULIZER 3 TIMES A DAY 12/07/19   Cletis Athens, MD  Alcohol Swabs (B-D SINGLE USE SWABS REGULAR) PADS USE AS DIRECTED FOR BLOOD SUGAR CHECKS. 08/20/19   Cletis Athens, MD  ALLERGY RELIEF 10 MG tablet TAKE ONE TABLET BY MOUTH EVERY DAY 08/20/19   Cletis Athens, MD  asenapine (SAPHRIS) 5 MG SUBL 24 hr tablet Place 1 tablet (5 mg total) 2 (two) times daily under the tongue. 11/13/16   Bettey Costa, MD  atenolol (TENORMIN) 100 MG tablet Take 1 tablet (100 mg total) by mouth daily. 09/24/19   Cletis Athens, MD  atorvastatin (LIPITOR) 10  MG tablet TAKE 1 TABLET BY MOUTH EACH DAY. 08/20/19   Masoud, Renda Rolls, MD  BREO ELLIPTA 100-25 MCG/INH AEPB INHALE 1 PUFF BY MOUTH EVERY DAY 08/20/19   Corky Downs, MD  cloZAPine (CLOZARIL) 100 MG tablet Take 100 mg by mouth daily.    [provider]  divalproex (DEPAKOTE ER) 500 MG 24 hr tablet Take 2,000 mg by mouth at bedtime.     [provider]  furosemide (LASIX) 20 MG tablet TAKE 3 TABLETS (60MG ) BY MOUTH ONCE DAILY 06/11/19   08/11/19, MD  glucose blood (TRUE METRIX BLOOD GLUCOSE TEST) test strip FINGERSTICK BLOOD SUGAR ONCE DAILY 10/14/19    10/16/19, MD  haloperidol (HALDOL) 5 MG tablet Take 5 mg by mouth 2 (two) times daily.     [provider]  ibuprofen (ADVIL,MOTRIN) 600 MG tablet Take 1 tablet (600 mg total) by mouth every 6 (six) hours as needed (foot pain). 01/04/17   03/04/17, MD  levothyroxine (SYNTHROID) 75 MCG tablet TAKE 1 TABLET BY MOUTH EACH DAY. 08/20/19   08/22/19, MD  losartan (COZAAR) 50 MG tablet TAKE 1 TABLET BY MOUTH EACH DAY. *IF BLOOD PRESSURE IS LESS THAN 100/70 DONT GIVE  MEDICATION* 08/20/19   08/22/19, MD  metFORMIN (GLUCOPHAGE) 1000 MG tablet TAKE 1 TABLET BY MOUTH TWICE A DAY WITH FOOD 10/16/19   10/18/19, FNP  metFORMIN (GLUCOPHAGE) 500 MG tablet Take 1 tablet (500 mg total) by mouth 2 (two) times daily with a meal. 09/24/19 10/24/19  10/26/19, MD  naproxen (NAPROSYN) 500 MG tablet Take 1 tablet (500 mg total) by mouth 2 (two) times daily with a meal. 11/30/19   12/02/19, MD  omeprazole (PRILOSEC) 20 MG capsule Take 1 capsule (20 mg total) by mouth daily. 07/07/19   09/07/19, MD  oxybutynin (DITROPAN) 5 MG tablet TAKE 1 TABLET BY MOUTH ONCE DAILY 08/06/19   10/06/19, MD  Semaglutide,0.25 or 0.5MG /DOS, (OZEMPIC, 0.25 OR 0.5 MG/DOSE,) 2 MG/1.5ML SOPN Inject 0.5 mg into the skin once a week. 10/14/19   10/16/19, MD  sitaGLIPtin (JANUVIA) 100 MG tablet Take 1 tablet (100 mg total) by mouth daily. 09/24/19   09/26/19, MD    Allergies Patient has no known allergies.  Family History  Problem Relation Age of Onset   Glaucoma Mother     Social History Social History   Tobacco Use   Smoking status: Former Smoker   Smokeless tobacco: Never Used  Corky Downs Use: Never used  Substance Use Topics   Alcohol use: Not Currently   Drug use: No     Review of Systems  Constitutional: No fever/chills Eyes: No visual changes. No discharge ENT: No upper respiratory complaints. Cardiovascular: no chest pain. Respiratory: no  cough. No SOB. Gastrointestinal: No abdominal pain.  No nausea, no vomiting.  No diarrhea.  No constipation. Musculoskeletal: Positive for right thumb pain Skin: Negative for rash, abrasions, lacerations, ecchymosis. Neurological: Negative for headaches, focal weakness or numbness.  10 System ROS otherwise negative.  ____________________________________________   PHYSICAL EXAM:  VITAL SIGNS: ED Triage Vitals  Enc Vitals Group     BP 12/29/19 1442 100/74     Pulse Rate 12/29/19 1442 97     Resp 12/29/19 1442 18     Temp 12/29/19 1442 97.9 F (36.6 C)     Temp Source 12/29/19 1442 Oral     SpO2 12/29/19 1442 97 %  Weight 12/29/19 1442 282 lb (127.9 kg)     Height 12/29/19 1442 6' (1.829 m)     Head Circumference --      Peak Flow --      Pain Score 12/29/19 1441 8     Pain Loc --      Pain Edu? --      Excl. in [place]? --      Constitutional: Alert and oriented. Well appearing and in no acute distress. Eyes: Conjunctivae are normal. PERRL. EOMI. Head: Atraumatic. ENT:      Ears:       Nose: No congestion/rhinnorhea.      Mouth/Throat: Mucous membranes are moist.  Neck: No stridor.    Cardiovascular: Normal rate, regular rhythm. Normal S1 and S2.  Good peripheral circulation. Respiratory: Normal respiratory effort without tachypnea or retractions. Lungs CTAB. Good air entry to the bases with no decreased or absent breath sounds. Musculoskeletal: Full range of motion to all extremities. No gross deformities appreciated.  Visualization of the right hand reveals no gross edema, ecchymosis.  No deformity noted.  Full range of motion all digits.  Patient is tender to palpation over the extensor tendon of the thumb at the level of the MCP joint.  No other significant tenderness appreciated on physical exam.  Positive for Finkelstein's.  Sensation capillary refill intact to the digit.  No other visible signs of trauma to the hand.  No other tenderness to palpation.  Full range of  motion all digits.  Sensation intact all digits.  Capillary refill less than 2 seconds all digits. Neurologic:  Normal speech and language. No gross focal neurologic deficits are appreciated.  Skin:  Skin is warm, dry and intact. No rash noted. Psychiatric: Mood and affect are normal. Speech and behavior are normal. Patient exhibits appropriate insight and judgement.   ____________________________________________   LABS (all labs ordered are listed, but only abnormal results are displayed)  Labs Reviewed - No data to display ____________________________________________  EKG   ____________________________________________  RADIOLOGY I personally viewed and evaluated these images as part of my medical decision making, as well as reviewing the written report by the radiologist.  ED Provider Interpretation: No acute fractures or dislocations identified.  Mild osteoarthritis of the digit  DG Finger Thumb Right  Result Date: 12/29/2019 CLINICAL DATA:  [place] last week, pain EXAM: RIGHT THUMB 2+V COMPARISON:  None. FINDINGS: Frontal, oblique, and lateral views of the right first digit are obtained. No acute fracture, subluxation, or dislocation. Mild osteoarthritis of the first carpometacarpal and metacarpophalangeal joints. Soft tissues are unremarkable. IMPRESSION: 1. Mild osteoarthritis.  No acute bony abnormality. Electronically Signed   By: Randa Ngo M.D.   On: 12/29/2019 14:59    ____________________________________________    PROCEDURES  Procedure(s) performed:    Procedures    Medications - No data to display   ____________________________________________   INITIAL IMPRESSION / ASSESSMENT AND PLAN / ED COURSE  Pertinent labs & imaging results that were available during my care of the patient were reviewed by me and considered in my medical decision making (see chart for details).  Review of the [place] CSRS was performed in accordance of the [place] prior to dispensing  any controlled drugs.           Patient's diagnosis is consistent with thumb sprain.  Patient presented to the emergency department complaining of pain to the right thumb after a fall.  Patient landed awkwardly on his right hand but is unsure the exact  mechanism of injury to the thumb itself.  History of previous fracture many years ago.  Patient had been having pain at the MCP joint.  Imaging revealed no acute fractures or dislocations.  Full range of motion.  Patient will have Ace bandage and anti-inflammatories for symptom relief.  Follow-up primary care as needed..  Patient is given ED precautions to return to the ED for any worsening or new symptoms.     ____________________________________________  FINAL CLINICAL IMPRESSION(S) / ED DIAGNOSES  Final diagnoses:  Sprain of metacarpophalangeal (MCP) joint of right thumb, initial encounter      NEW MEDICATIONS STARTED DURING THIS VISIT:  ED Discharge Orders         Ordered    meloxicam (MOBIC) 15 MG tablet  Daily        12/29/19 1620              This chart was dictated using voice recognition software/Dragon. Despite best efforts to proofread, errors can occur which can change the meaning. Any change was purely unintentional.    Darletta Moll, PA-C 12/29/19 1621    Nance Pear, MD 12/29/19 1901

## 2020-01-04 ENCOUNTER — Other Ambulatory Visit: Payer: Self-pay

## 2020-01-04 ENCOUNTER — Encounter: Payer: Self-pay | Admitting: Internal Medicine

## 2020-01-04 ENCOUNTER — Ambulatory Visit (INDEPENDENT_AMBULATORY_CARE_PROVIDER_SITE_OTHER): Payer: 59 | Admitting: Internal Medicine

## 2020-01-04 DIAGNOSIS — M1710 Unilateral primary osteoarthritis, unspecified knee: Secondary | ICD-10-CM | POA: Diagnosis not present

## 2020-01-04 DIAGNOSIS — F200 Paranoid schizophrenia: Secondary | ICD-10-CM

## 2020-01-04 DIAGNOSIS — E139 Other specified diabetes mellitus without complications: Secondary | ICD-10-CM

## 2020-01-04 DIAGNOSIS — R400 Somnolence: Secondary | ICD-10-CM

## 2020-01-04 LAB — GLUCOSE, POCT (MANUAL RESULT ENTRY): POC Glucose: 82 mg/dl (ref 70–99)

## 2020-01-04 NOTE — Assessment & Plan Note (Signed)
-   I encouraged the patient to lose weight.  - I educated them on making healthy dietary choices including eating more fruits and vegetables and less fried foods. - I encouraged the patient to exercise more, and educated on the benefits of exercise including weight loss, diabetes prevention, and hypertension prevention.   

## 2020-01-04 NOTE — Assessment & Plan Note (Signed)
Stable at the present time he does not have any hallucination or delirium.

## 2020-01-04 NOTE — Progress Notes (Signed)
Established Patient Office Visit  Subjective:  Patient ID: John [name]., male    DOB: 1975-01-04  Age: 45 y.o. MRN: [id number]  CC:  Chief Complaint  Patient presents with  . FL2    Patient is here to have his fl2 paperwork filled out    HPI  John [name]. presents for   Past Medical History:  Diagnosis Date  . Anemia   . Asthma   . Diabetes mellitus   . 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 Morbid obesity ([place])   . Paranoid schizophrenia ([place])   . Personality disorder ([place])   . Sleep apnea   . Thyroid disease     Past Surgical History:  Procedure Laterality Date  . FRACTURE SURGERY      Family History  Problem Relation Age of Onset  . Glaucoma Mother     Social History   Socioeconomic History  . Marital status: Single    Spouse name: Not on file  . Number of children: Not on file  . Years of education: Not on file  . Highest education level: Not on file  Occupational History  . Not on file  Tobacco Use  . Smoking status: Former Research scientist (life sciences)  . Smokeless tobacco: Never Used  Vaping Use  . Vaping Use: Never used  Substance and Sexual Activity  . Alcohol use: Not Currently  . Drug use: No  . Sexual activity: Not on file  Other Topics Concern  . Not on file  Social History Narrative  . Not on file   Social Determinants of Health   Financial Resource Strain: Not on file  Food Insecurity: Not on file  Transportation Needs: Not on file  Physical Activity: Not on file  Stress: Not on file  Social Connections: Not on file  Intimate Partner Violence: Not on file     Current Outpatient Medications:  .  Accu-Chek Softclix Lancets lancets, Use as instructed, Disp: 100 each, Rfl: 12 .  albuterol (PROVENTIL) (2.5 MG/3ML) 0.083% nebulizer solution, USE 1 VIAL VIA NEBULIZER 3 TIMES A DAY, Disp: 300 mL, Rfl: 6 .  Alcohol Swabs (B-D SINGLE USE SWABS REGULAR) PADS, USE AS DIRECTED FOR BLOOD SUGAR CHECKS., Disp: 100 each, Rfl:  10 .  ALLERGY RELIEF 10 MG tablet, TAKE ONE TABLET BY MOUTH EVERY DAY, Disp: 30 tablet, Rfl: 10 .  asenapine (SAPHRIS) 5 MG SUBL 24 hr tablet, Place 1 tablet (5 mg total) 2 (two) times daily under the tongue., Disp: 60 tablet, Rfl: 0 .  atenolol (TENORMIN) 100 MG tablet, Take 1 tablet (100 mg total) by mouth daily., Disp: 30 tablet, Rfl: 3 .  atorvastatin (LIPITOR) 10 MG tablet, TAKE 1 TABLET BY MOUTH EACH DAY., Disp: 30 tablet, Rfl: 10 .  cloZAPine (CLOZARIL) 100 MG tablet, Take 100 mg by mouth daily., Disp: , Rfl:  .  divalproex (DEPAKOTE ER) 500 MG 24 hr tablet, Take 2,000 mg by mouth at bedtime. , Disp: , Rfl:  .  furosemide (LASIX) 20 MG tablet, TAKE 3 TABLETS (60MG ) BY MOUTH ONCE DAILY, Disp: 90 tablet, Rfl: 10 .  glucose blood (TRUE METRIX BLOOD GLUCOSE TEST) test strip, FINGERSTICK BLOOD SUGAR ONCE DAILY, Disp: 100 each, Rfl: 10 .  haloperidol (HALDOL) 5 MG tablet, Take 5 mg by mouth 2 (two) times daily., Disp: , Rfl:  .  levothyroxine (SYNTHROID) 75 MCG tablet, TAKE 1 TABLET BY MOUTH EACH DAY., Disp: 30 tablet, Rfl: 10 .  losartan (  COZAAR) 50 MG tablet, TAKE 1 TABLET BY MOUTH EACH DAY. *IF BLOOD PRESSURE IS LESS THAN 100/70 DONT GIVE  MEDICATION*, Disp: 30 tablet, Rfl: 10 .  meloxicam (MOBIC) 15 MG tablet, Take 1 tablet (15 mg total) by mouth daily., Disp: 30 tablet, Rfl: 0 .  metFORMIN (GLUCOPHAGE) 1000 MG tablet, TAKE 1 TABLET BY MOUTH TWICE A DAY WITH FOOD, Disp: 60 tablet, Rfl: 4 .  omeprazole (PRILOSEC) 20 MG capsule, Take 1 capsule (20 mg total) by mouth daily., Disp: 90 capsule, Rfl: 1 .  oxybutynin (DITROPAN) 5 MG tablet, TAKE 1 TABLET BY MOUTH ONCE DAILY, Disp: 30 tablet, Rfl: 10 .  Semaglutide,0.25 or 0.5MG /DOS, (OZEMPIC, 0.25 OR 0.5 MG/DOSE,) 2 MG/1.5ML SOPN, Inject 0.5 mg into the skin once a week., Disp: 1.5 mL, Rfl: 10 .  sitaGLIPtin (JANUVIA) 100 MG tablet, Take 1 tablet (100 mg total) by mouth daily., Disp: 30 tablet, Rfl: 3   No Known Allergies  ROS Review of Systems   Constitutional: Negative.   HENT: Negative.   Eyes: Negative.   Respiratory: Negative.   Cardiovascular: Negative.   Gastrointestinal: Negative.   Endocrine: Negative.   Genitourinary: Negative.   Musculoskeletal: Negative.   Skin: Negative.   Allergic/Immunologic: Negative.   Neurological: Negative.   Hematological: Negative.   Psychiatric/Behavioral: Negative.   All other systems reviewed and are negative.     Objective:    Physical Exam Vitals reviewed.  Constitutional:      Appearance: Normal appearance.  HENT:     Mouth/Throat:     Mouth: Mucous membranes are moist.  Eyes:     Pupils: Pupils are equal, round, and reactive to light.  Neck:     Vascular: No carotid bruit.  Cardiovascular:     Rate and Rhythm: Normal rate and regular rhythm.     Pulses: Normal pulses.     Heart sounds: Normal heart sounds.  Pulmonary:     Effort: Pulmonary effort is normal.     Breath sounds: Normal breath sounds.  Abdominal:     General: Bowel sounds are normal.     Palpations: Abdomen is soft. There is no hepatomegaly, splenomegaly or mass.     Tenderness: There is no abdominal tenderness.     Hernia: No hernia is present.  Musculoskeletal:     Cervical back: Neck supple.     Right lower leg: Edema present.     Left lower leg: Edema present.  Skin:    Findings: No rash.  Neurological:     Mental Status: He is alert and oriented to person, place, and time.     Motor: No weakness.  Psychiatric:        Mood and Affect: Mood normal.        Behavior: Behavior normal.     There were no vitals taken for this visit. Wt Readings from Last 3 Encounters:  12/29/19 282 lb (127.9 kg)  12/22/19 300 lb (136.1 kg)  12/21/19 282 lb 8 oz (128.1 kg)     Health Maintenance Due  Topic Date Due  . Hepatitis C Screening  Never done  . COVID-19 Vaccine (1) Never done  . FOOT EXAM  Never done  . OPHTHALMOLOGY EXAM  Never done  . TETANUS/TDAP  Never done  . HEMOGLOBIN A1C   05/01/2017  . INFLUENZA VACCINE  08/02/2019    There are no preventive care reminders to display for this patient.  Lab Results  Component Value Date   TSH 1.842 11/01/2016  Lab Results  Component Value Date   WBC 9.4 12/22/2019   HGB 12.6 (L) 12/22/2019   HCT 41.6 12/22/2019   MCV 87.2 12/22/2019   PLT 164 12/22/2019   Lab Results  Component Value Date   NA 138 12/22/2019   K 4.8 12/22/2019   CO2 28 12/22/2019   GLUCOSE 129 (H) 12/22/2019   BUN 15 12/22/2019   CREATININE 0.88 12/22/2019   BILITOT 0.9 12/21/2019   ALKPHOS 47 12/21/2019   AST 13 (L) 12/21/2019   ALT 9 12/21/2019   PROT 9.0 (H) 12/21/2019   ALBUMIN 4.0 12/21/2019   CALCIUM 9.1 12/22/2019   ANIONGAP 11 12/22/2019   Lab Results  Component Value Date   CHOL 146 11/24/2013   Lab Results  Component Value Date   HDL 22 (L) 11/24/2013   Lab Results  Component Value Date   LDLCALC 94 11/24/2013   Lab Results  Component Value Date   TRIG 152 11/24/2013   No results found for: CHOLHDL Lab Results  Component Value Date   HGBA1C 5.7 (H) 11/01/2016      Assessment & Plan:   Problem List Items Addressed This Visit      Endocrine   Diabetes mellitus ([place]) - Primary     - I encouraged the patient to monitor diet. I encouraged the patient to eat low-carb and low-sugar to help prevent blood sugar spikes.  - I encouraged the patient to continue following their prescribed treatment plan for diabetes - I informed the patient to get help if blood sugar drops below 54mg /dL, or if suddenly have trouble thinking clearly or breathing.          Relevant Orders   POCT glucose (manual entry) (Completed)     Musculoskeletal and Integument   Knee arthropathy    Stable at the present time.        Other   Paranoid schizophrenia, chronic condition ([place]) (Chronic)    Stable at the present time he does not have any hallucination or delirium.      Morbid obesity ([place])    - I encouraged the patient to  lose weight.  - I educated them on making healthy dietary choices including eating more fruits and vegetables and less fried foods. - I encouraged the patient to exercise more, and educated on the benefits of exercise including weight loss, diabetes prevention, and hypertension prevention.        Somnolence    Patient has a sleep apnea.         No orders of the defined types were placed in this encounter.   Follow-up: No follow-ups on file.    Cletis Athens, MD

## 2020-01-04 NOTE — Assessment & Plan Note (Signed)
-   I encouraged the patient to monitor diet. I encouraged the patient to eat low-carb and low-sugar to help prevent blood sugar spikes.  - I encouraged the patient to continue following their prescribed treatment plan for diabetes - I informed the patient to get help if blood sugar drops below 54mg /dL, or if suddenly have trouble thinking clearly or breathing.

## 2020-01-04 NOTE — Assessment & Plan Note (Signed)
Patient has a sleep apnea.

## 2020-01-04 NOTE — Assessment & Plan Note (Signed)
Stable at the present time. 

## 2020-01-21 ENCOUNTER — Other Ambulatory Visit: Payer: Self-pay | Admitting: Internal Medicine

## 2020-01-26 ENCOUNTER — Other Ambulatory Visit: Payer: Self-pay

## 2020-01-26 ENCOUNTER — Ambulatory Visit (INDEPENDENT_AMBULATORY_CARE_PROVIDER_SITE_OTHER): Payer: 59 | Admitting: Internal Medicine

## 2020-01-26 ENCOUNTER — Encounter: Payer: Self-pay | Admitting: Internal Medicine

## 2020-01-26 VITALS — BP 109/78 | HR 79 | Ht 75.0 in | Wt 281.0 lb

## 2020-01-26 DIAGNOSIS — I10 Essential (primary) hypertension: Secondary | ICD-10-CM

## 2020-01-26 DIAGNOSIS — E038 Other specified hypothyroidism: Secondary | ICD-10-CM | POA: Diagnosis not present

## 2020-01-26 DIAGNOSIS — E139 Other specified diabetes mellitus without complications: Secondary | ICD-10-CM

## 2020-01-26 DIAGNOSIS — G4734 Idiopathic sleep related nonobstructive alveolar hypoventilation: Secondary | ICD-10-CM

## 2020-01-26 LAB — GLUCOSE, POCT (MANUAL RESULT ENTRY): POC Glucose: 101 mg/dl — AB (ref 70–99)

## 2020-01-26 NOTE — Progress Notes (Signed)
Established Patient Office Visit  Subjective:  Patient ID: John [name]., male    DOB: May 31, 1975  Age: 45 y.o. MRN: [id number]  CC:  Chief Complaint  Patient presents with  . Diabetes    Patient is here for diabetes management. Blood sugars have been running low    HPI  John [name]. presents for check up  Past Medical History:  Diagnosis Date  . Anemia   . Asthma   . Diabetes mellitus   . 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 Morbid obesity ([place])   . Paranoid schizophrenia ([place])   . Personality disorder ([place])   . Sleep apnea   . Thyroid disease     Past Surgical History:  Procedure Laterality Date  . FRACTURE SURGERY      Family History  Problem Relation Age of Onset  . Glaucoma Mother     Social History   Socioeconomic History  . Marital status: Single    Spouse name: Not on file  . Number of children: Not on file  . Years of education: Not on file  . Highest education level: Not on file  Occupational History  . Not on file  Tobacco Use  . Smoking status: Former Research scientist (life sciences)  . Smokeless tobacco: Never Used  Vaping Use  . Vaping Use: Never used  Substance and Sexual Activity  . Alcohol use: Not Currently  . Drug use: No  . Sexual activity: Not on file  Other Topics Concern  . Not on file  Social History Narrative  . Not on file   Social Determinants of Health   Financial Resource Strain: Not on file  Food Insecurity: Not on file  Transportation Needs: Not on file  Physical Activity: Not on file  Stress: Not on file  Social Connections: Not on file  Intimate Partner Violence: Not on file     Current Outpatient Medications:  .  Accu-Chek Softclix Lancets lancets, Use as instructed, Disp: 100 each, Rfl: 12 .  albuterol (PROVENTIL) (2.5 MG/3ML) 0.083% nebulizer solution, USE 1 VIAL VIA NEBULIZER 3 TIMES A DAY, Disp: 300 mL, Rfl: 6 .  Alcohol Swabs (B-D SINGLE USE SWABS REGULAR) PADS, USE AS DIRECTED FOR BLOOD  SUGAR CHECKS., Disp: 100 each, Rfl: 10 .  ALLERGY RELIEF 10 MG tablet, TAKE ONE TABLET BY MOUTH EVERY DAY, Disp: 30 tablet, Rfl: 10 .  asenapine (SAPHRIS) 5 MG SUBL 24 hr tablet, Place 1 tablet (5 mg total) 2 (two) times daily under the tongue., Disp: 60 tablet, Rfl: 0 .  atenolol (TENORMIN) 100 MG tablet, Take 1 tablet (100 mg total) by mouth daily., Disp: 30 tablet, Rfl: 3 .  atorvastatin (LIPITOR) 10 MG tablet, TAKE 1 TABLET BY MOUTH EACH DAY., Disp: 30 tablet, Rfl: 10 .  cloZAPine (CLOZARIL) 100 MG tablet, Take 100 mg by mouth daily., Disp: , Rfl:  .  divalproex (DEPAKOTE ER) 500 MG 24 hr tablet, Take 2,000 mg by mouth at bedtime. , Disp: , Rfl:  .  furosemide (LASIX) 20 MG tablet, TAKE 3 TABLETS (60MG ) BY MOUTH ONCE DAILY, Disp: 90 tablet, Rfl: 10 .  glucose blood (TRUE METRIX BLOOD GLUCOSE TEST) test strip, FINGERSTICK BLOOD SUGAR ONCE DAILY, Disp: 100 each, Rfl: 10 .  haloperidol (HALDOL) 5 MG tablet, Take 5 mg by mouth 2 (two) times daily., Disp: , Rfl:  .  levothyroxine (SYNTHROID) 75 MCG tablet, TAKE 1 TABLET BY MOUTH EACH DAY., Disp: 30 tablet, Rfl: 10 .  losartan (COZAAR) 50 MG tablet, TAKE 1 TABLET BY MOUTH EACH DAY. *IF BLOOD PRESSURE IS LESS THAN 100/70 DONT GIVE  MEDICATION*, Disp: 30 tablet, Rfl: 10 .  meloxicam (MOBIC) 15 MG tablet, Take 1 tablet (15 mg total) by mouth daily., Disp: 30 tablet, Rfl: 0 .  metFORMIN (GLUCOPHAGE) 1000 MG tablet, TAKE 1 TABLET BY MOUTH TWICE A DAY WITH FOOD, Disp: 60 tablet, Rfl: 4 .  omeprazole (PRILOSEC) 20 MG capsule, TAKE 1 CAPSULE BY MOUTH EVERY DAY, Disp: 30 capsule, Rfl: 6 .  oxybutynin (DITROPAN) 5 MG tablet, TAKE 1 TABLET BY MOUTH ONCE DAILY, Disp: 30 tablet, Rfl: 10 .  Semaglutide,0.25 or 0.5MG /DOS, (OZEMPIC, 0.25 OR 0.5 MG/DOSE,) 2 MG/1.5ML SOPN, Inject 0.5 mg into the skin once a week., Disp: 1.5 mL, Rfl: 10 .  sitaGLIPtin (JANUVIA) 100 MG tablet, Take 1 tablet (100 mg total) by mouth daily., Disp: 30 tablet, Rfl: 3   No Known  Allergies  ROS Review of Systems  Constitutional: Negative.   HENT: Negative.  Negative for congestion.   Eyes: Negative.   Respiratory: Negative.  Negative for chest tightness.   Cardiovascular: Negative.  Negative for chest pain.  Gastrointestinal: Positive for abdominal distention. Negative for abdominal pain.  Endocrine: Negative.   Genitourinary: Negative.   Musculoskeletal: Negative.   Skin: Negative.   Allergic/Immunologic: Negative.   Neurological: Positive for seizures. Negative for headaches.  Hematological: Negative.   Psychiatric/Behavioral: Negative.   All other systems reviewed and are negative.     Objective:    Physical Exam Vitals reviewed.  Constitutional:      Appearance: Normal appearance.  HENT:     Mouth/Throat:     Mouth: Mucous membranes are moist.  Eyes:     Pupils: Pupils are equal, round, and reactive to light.  Neck:     Vascular: No carotid bruit.  Cardiovascular:     Rate and Rhythm: Normal rate and regular rhythm.     Pulses: Normal pulses.     Heart sounds: Normal heart sounds.  Pulmonary:     Effort: Pulmonary effort is normal.     Breath sounds: Normal breath sounds.  Abdominal:     General: Bowel sounds are normal.     Palpations: Abdomen is soft. There is no hepatomegaly, splenomegaly or mass.     Tenderness: There is no abdominal tenderness.     Hernia: No hernia is present.  Musculoskeletal:     Cervical back: Neck supple.     Right lower leg: No edema.     Left lower leg: No edema.  Skin:    Findings: No rash.  Neurological:     Mental Status: He is alert and oriented to person, place, and time.     Motor: No weakness.  Psychiatric:        Mood and Affect: Mood normal.        Behavior: Behavior normal.     BP 109/78   Pulse 79   Ht 6\' 3"  (1.905 m)   Wt 281 lb (127.5 kg)   BMI 35.12 kg/m  Wt Readings from Last 3 Encounters:  01/26/20 281 lb (127.5 kg)  12/29/19 282 lb (127.9 kg)  12/22/19 300 lb (136.1 kg)      Health Maintenance Due  Topic Date Due  . Hepatitis C Screening  Never done  . COVID-19 Vaccine (1) Never done  . FOOT EXAM  Never done  . OPHTHALMOLOGY EXAM  Never done  . TETANUS/TDAP  Never done  .  HEMOGLOBIN A1C  05/01/2017  . INFLUENZA VACCINE  08/02/2019    There are no preventive care reminders to display for this patient.  Lab Results  Component Value Date   TSH 1.842 11/01/2016   Lab Results  Component Value Date   WBC 9.4 12/22/2019   HGB 12.6 (L) 12/22/2019   HCT 41.6 12/22/2019   MCV 87.2 12/22/2019   PLT 164 12/22/2019   Lab Results  Component Value Date   NA 138 12/22/2019   K 4.8 12/22/2019   CO2 28 12/22/2019   GLUCOSE 129 (H) 12/22/2019   BUN 15 12/22/2019   CREATININE 0.88 12/22/2019   BILITOT 0.9 12/21/2019   ALKPHOS 47 12/21/2019   AST 13 (L) 12/21/2019   ALT 9 12/21/2019   PROT 9.0 (H) 12/21/2019   ALBUMIN 4.0 12/21/2019   CALCIUM 9.1 12/22/2019   ANIONGAP 11 12/22/2019   Lab Results  Component Value Date   CHOL 146 11/24/2013   Lab Results  Component Value Date   HDL 22 (L) 11/24/2013   Lab Results  Component Value Date   LDLCALC 94 11/24/2013   Lab Results  Component Value Date   TRIG 152 11/24/2013   No results found for: CHOLHDL Lab Results  Component Value Date   HGBA1C 5.7 (H) 11/01/2016      Assessment & Plan:   Problem List Items Addressed This Visit      Cardiovascular and Mediastinum   Essential hypertension (Chronic)     Respiratory   Sleep apnea    Patient uses a CPAP machine to sleep at night on a regular basis.        Endocrine   Hypothyroidism (Chronic)    stable      Diabetes mellitus ([place]) - Primary    .  - I encouraged the patient to regularly check blood sugar.  - I encouraged the patient to monitor diet. I encouraged the patient to eat low-carb and low-sugar to help prevent blood sugar spikes.  - I encouraged the patient to continue following their prescribed treatment plan for  diabetes - I informed the patient to get help if blood sugar drops below 54mg /dL, or if suddenly have trouble thinking clearly or breathing.          Relevant Orders   POCT glucose (manual entry) (Completed)     Other   Morbid obesity ([place])    - I encouraged the patient to lose weight.  - I educated them on making healthy dietary choices including eating more fruits and vegetables and less fried foods. - I encouraged the patient to exercise more, and educated on the benefits of exercise including weight loss, diabetes management, and hypertension management.           No orders of the defined types were placed in this encounter.  Reduce ozempic  To .25 mg  I/m once a wk Follow-up: No follow-ups on file.    Cletis Athens, MD

## 2020-01-26 NOTE — Assessment & Plan Note (Signed)
-   I encouraged the patient to regularly check blood sugar.  - I encouraged the patient to monitor diet. I encouraged the patient to eat low-carb and low-sugar to help prevent blood sugar spikes.  - I encouraged the patient to continue following their prescribed treatment plan for diabetes - I informed the patient to get help if blood sugar drops below 54mg/dL, or if suddenly have trouble thinking clearly or breathing.     

## 2020-01-26 NOTE — Assessment & Plan Note (Signed)
-   I encouraged the patient to lose weight.  - I educated them on making healthy dietary choices including eating more fruits and vegetables and less fried foods. - I encouraged the patient to exercise more, and educated on the benefits of exercise including weight loss, diabetes management, and hypertension management.   

## 2020-01-26 NOTE — Assessment & Plan Note (Signed)
stable °

## 2020-01-26 NOTE — Assessment & Plan Note (Signed)
Patient uses a CPAP machine to sleep at night on a regular basis.

## 2020-02-02 LAB — HM DIABETES EYE EXAM

## 2020-02-29 ENCOUNTER — Ambulatory Visit (INDEPENDENT_AMBULATORY_CARE_PROVIDER_SITE_OTHER): Payer: 59 | Admitting: Internal Medicine

## 2020-02-29 ENCOUNTER — Other Ambulatory Visit: Payer: Self-pay

## 2020-02-29 ENCOUNTER — Encounter: Payer: Self-pay | Admitting: Internal Medicine

## 2020-02-29 VITALS — BP 101/69 | HR 96 | Ht 73.0 in | Wt 282.5 lb

## 2020-02-29 DIAGNOSIS — Z720 Tobacco use: Secondary | ICD-10-CM

## 2020-02-29 DIAGNOSIS — Z794 Long term (current) use of insulin: Secondary | ICD-10-CM

## 2020-02-29 DIAGNOSIS — E119 Type 2 diabetes mellitus without complications: Secondary | ICD-10-CM | POA: Diagnosis not present

## 2020-02-29 DIAGNOSIS — I10 Essential (primary) hypertension: Secondary | ICD-10-CM | POA: Diagnosis not present

## 2020-02-29 DIAGNOSIS — R400 Somnolence: Secondary | ICD-10-CM

## 2020-02-29 LAB — GLUCOSE, POCT (MANUAL RESULT ENTRY): POC Glucose: 163 mg/dl — AB (ref 70–99)

## 2020-02-29 NOTE — Assessment & Plan Note (Signed)
-   I encouraged the patient to lose weight.  - I educated them on making healthy dietary choices including eating more fruits and vegetables and less fried foods. - I encouraged the patient to exercise more, and educated on the benefits of exercise including weight .

## 2020-02-29 NOTE — Assessment & Plan Note (Signed)
Patient was advised to use CPAP machine

## 2020-02-29 NOTE — Assessment & Plan Note (Signed)
Patient blood pressure is normal patient denies any chest pain or shortness of breath there is no history of palpitation paroxysmal nocturnal dyspnea patient can walk1/2  blocks without any problem patient was advised to follow low-salt low-cholesterol diet

## 2020-02-29 NOTE — Progress Notes (Signed)
Established Patient Office Visit  Subjective:  Patient ID: John Beaumier., male    DOB: 10-Mar-1975  Age: 45 y.o. MRN: [id number]  CC:  Chief Complaint  Patient presents with  . Diabetes    Patient's blood sugar was 87     HPI  John [name]. presents for blood sugar check  169  After  Lunch  , he is not using  cpap  [facility] not [facility] to be signed  Past Medical History:  Diagnosis Date  . Anemia   . Asthma   . Diabetes mellitus   . 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 Morbid obesity ([place])   . Paranoid schizophrenia ([place])   . Personality disorder ([place])   . Sleep apnea   . Thyroid disease     Past Surgical History:  Procedure Laterality Date  . FRACTURE SURGERY      Family History  Problem Relation Age of Onset  . Glaucoma Mother     Social History   Socioeconomic History  . Marital status: Single    Spouse name: Not on file  . Number of children: Not on file  . Years of education: Not on file  . Highest education level: Not on file  Occupational History  . Not on file  Tobacco Use  . Smoking status: Former Research scientist (life sciences)  . Smokeless tobacco: Never Used  Vaping Use  . Vaping Use: Never used  Substance and Sexual Activity  . Alcohol use: Not Currently  . Drug use: No  . Sexual activity: Not on file  Other Topics Concern  . Not on file  Social History Narrative  . Not on file   Social Determinants of Health   Financial Resource Strain: Not on file  Food Insecurity: Not on file  Transportation Needs: Not on file  Physical Activity: Not on file  Stress: Not on file  Social Connections: Not on file  Intimate Partner Violence: Not on file     Current Outpatient Medications:  .  Accu-Chek Softclix Lancets lancets, Use as instructed, Disp: 100 each, Rfl: 12 .  albuterol (PROVENTIL) (2.5 MG/3ML) 0.083% nebulizer solution, USE 1 VIAL VIA NEBULIZER 3 TIMES A DAY, Disp: 300 mL, Rfl: 6 .   Alcohol Swabs (B-D SINGLE USE SWABS REGULAR) PADS, USE AS DIRECTED FOR BLOOD SUGAR CHECKS., Disp: 100 each, Rfl: 10 .  ALLERGY RELIEF 10 MG tablet, TAKE ONE TABLET BY MOUTH EVERY DAY, Disp: 30 tablet, Rfl: 10 .  asenapine (SAPHRIS) 5 MG SUBL 24 hr tablet, Place 1 tablet (5 mg total) 2 (two) times daily under the tongue., Disp: 60 tablet, Rfl: 0 .  atenolol (TENORMIN) 100 MG tablet, Take 1 tablet (100 mg total) by mouth daily., Disp: 30 tablet, Rfl: 3 .  atorvastatin (LIPITOR) 10 MG tablet, TAKE 1 TABLET BY MOUTH EACH DAY., Disp: 30 tablet, Rfl: 10 .  cloZAPine (CLOZARIL) 100 MG tablet, Take 100 mg by mouth daily., Disp: , Rfl:  .  divalproex (DEPAKOTE ER) 500 MG 24 hr tablet, Take 2,000 mg by mouth at bedtime. , Disp: , Rfl:  .  furosemide (LASIX) 20 MG tablet, TAKE 3 TABLETS (60MG ) BY MOUTH ONCE DAILY, Disp: 90 tablet, Rfl: 10 .  glucose blood (TRUE METRIX BLOOD GLUCOSE TEST) test strip, FINGERSTICK BLOOD SUGAR ONCE DAILY, Disp: 100 each, Rfl: 10 .  haloperidol (HALDOL) 5 MG tablet, Take 5 mg by mouth 2 (two) times daily., Disp: , Rfl:  .  levothyroxine (SYNTHROID) 75 MCG tablet, TAKE 1 TABLET BY MOUTH EACH DAY., Disp: 30 tablet, Rfl: 10 .  losartan (COZAAR) 50 MG tablet, TAKE 1 TABLET BY MOUTH EACH DAY. *IF BLOOD PRESSURE IS LESS THAN 100/70 DONT GIVE  MEDICATION*, Disp: 30 tablet, Rfl: 10 .  meloxicam (MOBIC) 15 MG tablet, Take 1 tablet (15 mg total) by mouth daily., Disp: 30 tablet, Rfl: 0 .  metFORMIN (GLUCOPHAGE) 1000 MG tablet, TAKE 1 TABLET BY MOUTH TWICE A DAY WITH FOOD, Disp: 60 tablet, Rfl: 4 .  omeprazole (PRILOSEC) 20 MG capsule, TAKE 1 CAPSULE BY MOUTH EVERY DAY, Disp: 30 capsule, Rfl: 6 .  oxybutynin (DITROPAN) 5 MG tablet, TAKE 1 TABLET BY MOUTH ONCE DAILY, Disp: 30 tablet, Rfl: 10 .  Semaglutide,0.25 or 0.5MG /DOS, (OZEMPIC, 0.25 OR 0.5 MG/DOSE,) 2 MG/1.5ML SOPN, Inject 0.5 mg into the skin once a week., Disp: 1.5 mL, Rfl: 10 .  sitaGLIPtin (JANUVIA) 100 MG tablet, Take 1 tablet (100  mg total) by mouth daily., Disp: 30 tablet, Rfl: 3   No Known Allergies  ROS Review of Systems  Constitutional: Negative.   HENT: Negative.   Eyes: Negative.   Respiratory: Negative.   Cardiovascular: Negative.   Gastrointestinal: Negative.   Endocrine: Negative.   Genitourinary: Negative.   Musculoskeletal: Negative.   Skin: Negative.   Allergic/Immunologic: Negative.   Neurological: Negative.   Hematological: Negative.   Psychiatric/Behavioral: Negative.   All other systems reviewed and are negative.     Objective:    Physical Exam Vitals reviewed.  Constitutional:      Appearance: Normal appearance.  HENT:     Mouth/Throat:     Mouth: Mucous membranes are moist.  Eyes:     Pupils: Pupils are equal, round, and reactive to light.  Neck:     Vascular: No carotid bruit.  Cardiovascular:     Rate and Rhythm: Normal rate and regular rhythm.     Pulses: Normal pulses.     Heart sounds: Normal heart sounds.  Pulmonary:     Effort: Pulmonary effort is normal.     Breath sounds: Normal breath sounds.  Abdominal:     General: Bowel sounds are normal.     Palpations: Abdomen is soft. There is no hepatomegaly, splenomegaly or mass.     Tenderness: There is no abdominal tenderness.     Hernia: No hernia is present.  Musculoskeletal:     Cervical back: Neck supple.     Right lower leg: No edema.     Left lower leg: No edema.  Skin:    Findings: No rash.  Neurological:     Mental Status: He is alert and oriented to person, place, and time.     Motor: No weakness.  Psychiatric:        Mood and Affect: Mood normal.        Behavior: Behavior normal.     BP 101/69   Pulse 96   Ht 6\' 1"  (1.854 m)   Wt 282 lb 8 oz (128.1 kg)   BMI 37.27 kg/m  Wt Readings from Last 3 Encounters:  02/29/20 282 lb 8 oz (128.1 kg)  01/26/20 281 lb (127.5 kg)  12/29/19 282 lb (127.9 kg)     Health Maintenance Due  Topic Date Due  . Hepatitis C Screening  Never done  . COVID-19  Vaccine (1) Never done  . FOOT EXAM  Never done  . OPHTHALMOLOGY EXAM  Never done  . TETANUS/TDAP  Never done  .  HEMOGLOBIN A1C  05/01/2017  . INFLUENZA VACCINE  08/02/2019    There are no preventive care reminders to display for this patient.  Lab Results  Component Value Date   TSH 1.842 11/01/2016   Lab Results  Component Value Date   WBC 9.4 12/22/2019   HGB 12.6 (L) 12/22/2019   HCT 41.6 12/22/2019   MCV 87.2 12/22/2019   PLT 164 12/22/2019   Lab Results  Component Value Date   NA 138 12/22/2019   K 4.8 12/22/2019   CO2 28 12/22/2019   GLUCOSE 129 (H) 12/22/2019   BUN 15 12/22/2019   CREATININE 0.88 12/22/2019   BILITOT 0.9 12/21/2019   ALKPHOS 47 12/21/2019   AST 13 (L) 12/21/2019   ALT 9 12/21/2019   PROT 9.0 (H) 12/21/2019   ALBUMIN 4.0 12/21/2019   CALCIUM 9.1 12/22/2019   ANIONGAP 11 12/22/2019   Lab Results  Component Value Date   CHOL 146 11/24/2013   Lab Results  Component Value Date   HDL 22 (L) 11/24/2013   Lab Results  Component Value Date   LDLCALC 94 11/24/2013   Lab Results  Component Value Date   TRIG 152 11/24/2013   No results found for: CHOLHDL Lab Results  Component Value Date   HGBA1C 5.7 (H) 11/01/2016      Assessment & Plan:   Problem List Items Addressed This Visit      Cardiovascular and Mediastinum   Essential hypertension (Chronic)    Patient blood pressure is normal patient denies any chest pain or shortness of breath there is no history of palpitation paroxysmal nocturnal dyspnea patient can walk1/2  blocks without any problem patient was advised to follow low-salt low-cholesterol diet        Endocrine   Diabetes mellitus ([place]) - Primary    Blood sugar is labile patient is taking his medication today blood pressure was 165 and from rest home the blood pressure this morning was 87      Relevant Orders   POCT glucose (manual entry) (Completed)     Other   Morbid obesity ([place])    - I encouraged the patient  to lose weight.  - I educated them on making healthy dietary choices including eating more fruits and vegetables and less fried foods. - I encouraged the patient to exercise more, and educated on the benefits of exercise including weight .        Tobacco abuse     - I informed the patient that smoking puts them at increased risk for cancer, COPD, hypertension, and more.  - Informed the patient to seek help if they begin to have trouble breathing, develop chest pain, start to cough up blood, feel faint, or pass out.        Somnolence    Patient was advised to use CPAP machine         No orders of the defined types were placed in this encounter.   Follow-up: No follow-ups on file.    Cletis Athens, MD

## 2020-02-29 NOTE — Assessment & Plan Note (Signed)
-   I informed the patient that smoking puts them at increased risk for cancer, COPD, hypertension, and more.  - Informed the patient to seek help if they begin to have trouble breathing, develop chest pain, start to cough up blood, feel faint, or pass out.

## 2020-02-29 NOTE — Assessment & Plan Note (Signed)
Blood sugar is labile patient is taking his medication today blood pressure was 165 and from rest home the blood pressure this morning was 87

## 2020-03-17 ENCOUNTER — Other Ambulatory Visit: Payer: Self-pay | Admitting: Family Medicine

## 2020-03-28 ENCOUNTER — Ambulatory Visit: Payer: 59 | Admitting: Internal Medicine

## 2020-03-29 ENCOUNTER — Encounter: Payer: Self-pay | Admitting: Internal Medicine

## 2020-03-29 ENCOUNTER — Other Ambulatory Visit: Payer: Self-pay

## 2020-03-29 ENCOUNTER — Ambulatory Visit (INDEPENDENT_AMBULATORY_CARE_PROVIDER_SITE_OTHER): Payer: 59 | Admitting: Internal Medicine

## 2020-03-29 VITALS — BP 109/76 | HR 83 | Ht 73.0 in | Wt 286.2 lb

## 2020-03-29 DIAGNOSIS — G4734 Idiopathic sleep related nonobstructive alveolar hypoventilation: Secondary | ICD-10-CM

## 2020-03-29 DIAGNOSIS — E1169 Type 2 diabetes mellitus with other specified complication: Secondary | ICD-10-CM | POA: Diagnosis not present

## 2020-03-29 DIAGNOSIS — I10 Essential (primary) hypertension: Secondary | ICD-10-CM | POA: Diagnosis not present

## 2020-03-29 DIAGNOSIS — R400 Somnolence: Secondary | ICD-10-CM

## 2020-03-29 DIAGNOSIS — Z794 Long term (current) use of insulin: Secondary | ICD-10-CM

## 2020-03-29 LAB — GLUCOSE, POCT (MANUAL RESULT ENTRY): POC Glucose: 134 mg/dl — AB (ref 70–99)

## 2020-03-29 LAB — POCT GLYCOSYLATED HEMOGLOBIN (HGB A1C): HbA1c POC (<> result, manual entry): 6 % (ref 4.0–5.6)

## 2020-03-29 NOTE — Assessment & Plan Note (Signed)

## 2020-03-29 NOTE — Assessment & Plan Note (Signed)
-   I encouraged the patient to lose weight.  - I educated them on making healthy dietary choices including eating more fruits and vegetables and less fried foods. - I encouraged the patient to exercise more, and educated on the benefits of exercise including weight loss, diabetes prevention, and hypertension prevention.   

## 2020-03-29 NOTE — Assessment & Plan Note (Signed)
Increased sleepiness due to sleep apnea.  Patient was advised to use a CPAP machine on a regular basis.

## 2020-03-29 NOTE — Assessment & Plan Note (Signed)
Patient blood pressure is normal patient denies any chest pain or shortness of breath there is no history of palpitation paroxysmal nocturnal dyspnea patient can walkioo yards without any problem patient was advised to follow low-salt low-cholesterol diet  I reviewed the results of Sprint trial  ideally I want to keep systolic blood pressure below 130 mmHg, patient was asked to check blood pressure 3 times a week and give me a report on that.  Patient will be follow-up in 3 months, patient will call me back for any change in the cardiovascular symptoms    

## 2020-03-29 NOTE — Assessment & Plan Note (Signed)
Patient is using CPAP machine 

## 2020-03-29 NOTE — Progress Notes (Signed)
Established Patient Office Visit  Subjective:  Patient ID: John Murgia., male    DOB: [birth year]  Age: 45 y.o. MRN: [id number]  CC:  Chief Complaint  Patient presents with  . Diabetes    HPI  John [name]. presents for check up  Past Medical History:  Diagnosis Date  . Anemia   . Asthma   . Diabetes mellitus   . 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 Morbid obesity ([place])   . Paranoid schizophrenia ([place])   . Personality disorder ([place])   . Sleep apnea   . Thyroid disease     Past Surgical History:  Procedure Laterality Date  . FRACTURE SURGERY      Family History  Problem Relation Age of Onset  . Glaucoma Mother     Social History   Socioeconomic History  . Marital status: Single    Spouse name: Not on file  . Number of children: Not on file  . Years of education: Not on file  . Highest education level: Not on file  Occupational History  . Not on file  Tobacco Use  . Smoking status: Former Research scientist (life sciences)  . Smokeless tobacco: Never Used  Vaping Use  . Vaping Use: Never used  Substance and Sexual Activity  . Alcohol use: Not Currently  . Drug use: No  . Sexual activity: Not on file  Other Topics Concern  . Not on file  Social History Narrative  . Not on file   Social Determinants of Health   Financial Resource Strain: Not on file  Food Insecurity: Not on file  Transportation Needs: Not on file  Physical Activity: Not on file  Stress: Not on file  Social Connections: Not on file  Intimate Partner Violence: Not on file     Current Outpatient Medications:  .  Accu-Chek Softclix Lancets lancets, Use as instructed, Disp: 100 each, Rfl: 12 .  albuterol (PROVENTIL) (2.5 MG/3ML) 0.083% nebulizer solution, USE 1 VIAL VIA NEBULIZER 3 TIMES A DAY, Disp: 300 mL, Rfl: 6 .  Alcohol Swabs (B-D SINGLE USE SWABS REGULAR) PADS, USE AS DIRECTED FOR BLOOD SUGAR CHECKS., Disp: 100 each, Rfl: 10 .  ALLERGY RELIEF 10 MG tablet, TAKE  ONE TABLET BY MOUTH EVERY DAY, Disp: 30 tablet, Rfl: 10 .  asenapine (SAPHRIS) 5 MG SUBL 24 hr tablet, Place 1 tablet (5 mg total) 2 (two) times daily under the tongue., Disp: 60 tablet, Rfl: 0 .  atenolol (TENORMIN) 100 MG tablet, Take 1 tablet (100 mg total) by mouth daily., Disp: 30 tablet, Rfl: 3 .  atorvastatin (LIPITOR) 10 MG tablet, TAKE 1 TABLET BY MOUTH EACH DAY., Disp: 30 tablet, Rfl: 10 .  cloZAPine (CLOZARIL) 100 MG tablet, Take 100 mg by mouth daily., Disp: , Rfl:  .  divalproex (DEPAKOTE ER) 500 MG 24 hr tablet, Take 2,000 mg by mouth at bedtime. , Disp: , Rfl:  .  furosemide (LASIX) 20 MG tablet, TAKE 3 TABLETS (60MG ) BY MOUTH ONCE DAILY, Disp: 90 tablet, Rfl: 10 .  glucose blood (TRUE METRIX BLOOD GLUCOSE TEST) test strip, FINGERSTICK BLOOD SUGAR ONCE DAILY, Disp: 100 each, Rfl: 10 .  haloperidol (HALDOL) 5 MG tablet, Take 5 mg by mouth 2 (two) times daily., Disp: , Rfl:  .  levothyroxine (SYNTHROID) 75 MCG tablet, TAKE 1 TABLET BY MOUTH EACH DAY., Disp: 30 tablet, Rfl: 10 .  losartan (COZAAR) 50 MG tablet, TAKE 1 TABLET BY MOUTH EACH DAY. *IF  BLOOD PRESSURE IS LESS THAN 100/70 DONT GIVE  MEDICATION*, Disp: 30 tablet, Rfl: 10 .  meloxicam (MOBIC) 15 MG tablet, Take 1 tablet (15 mg total) by mouth daily., Disp: 30 tablet, Rfl: 0 .  metFORMIN (GLUCOPHAGE) 1000 MG tablet, TAKE 1 TABLET BY MOUTH TWICE A DAY WITH FOOD, Disp: 60 tablet, Rfl: 4 .  omeprazole (PRILOSEC) 20 MG capsule, TAKE 1 CAPSULE BY MOUTH EVERY DAY, Disp: 30 capsule, Rfl: 6 .  oxybutynin (DITROPAN) 5 MG tablet, TAKE 1 TABLET BY MOUTH ONCE DAILY, Disp: 30 tablet, Rfl: 10 .  Semaglutide,0.25 or 0.5MG /DOS, (OZEMPIC, 0.25 OR 0.5 MG/DOSE,) 2 MG/1.5ML SOPN, Inject 0.5 mg into the skin once a week., Disp: 1.5 mL, Rfl: 10 .  sitaGLIPtin (JANUVIA) 100 MG tablet, Take 1 tablet (100 mg total) by mouth daily., Disp: 30 tablet, Rfl: 3   No Known Allergies  ROS Review of Systems  Constitutional: Negative.   HENT: Negative.    Eyes: Negative.   Respiratory: Negative.   Cardiovascular: Negative.   Gastrointestinal: Negative.   Endocrine: Negative.   Genitourinary: Negative.   Musculoskeletal: Negative.   Skin: Negative.   Allergic/Immunologic: Negative.   Neurological: Negative.   Hematological: Negative.   Psychiatric/Behavioral: Negative.   All other systems reviewed and are negative.     Objective:    Physical Exam Vitals reviewed.  Constitutional:      Appearance: Normal appearance.  HENT:     Mouth/Throat:     Mouth: Mucous membranes are moist.  Eyes:     Pupils: Pupils are equal, round, and reactive to light.  Neck:     Vascular: No carotid bruit.  Cardiovascular:     Rate and Rhythm: Normal rate and regular rhythm.     Pulses: Normal pulses.     Heart sounds: Normal heart sounds.  Pulmonary:     Effort: Pulmonary effort is normal.     Breath sounds: Normal breath sounds.  Abdominal:     General: Bowel sounds are normal.     Palpations: Abdomen is soft. There is no hepatomegaly, splenomegaly or mass.     Tenderness: There is no abdominal tenderness.     Hernia: No hernia is present.  Musculoskeletal:     Cervical back: Neck supple.     Right lower leg: No edema.     Left lower leg: No edema.  Skin:    Findings: No rash.  Neurological:     Mental Status: He is alert and oriented to person, place, and time.     Motor: No weakness.  Psychiatric:        Mood and Affect: Mood normal.        Behavior: Behavior normal.     BP 109/76   Pulse 83   Ht 6\' 1"  (1.854 m)   Wt 286 lb 3.2 oz (129.8 kg)   BMI 37.76 kg/m  Wt Readings from Last 3 Encounters:  03/29/20 286 lb 3.2 oz (129.8 kg)  02/29/20 282 lb 8 oz (128.1 kg)  01/26/20 281 lb (127.5 kg)     Health Maintenance Due  Topic Date Due  . Hepatitis C Screening  Never done  . COVID-19 Vaccine (1) Never done  . FOOT EXAM  Never done  . OPHTHALMOLOGY EXAM  Never done  . TETANUS/TDAP  Never done  . INFLUENZA VACCINE   08/02/2019    There are no preventive care reminders to display for this patient.  Lab Results  Component Value Date   TSH 1.842  11/01/2016   Lab Results  Component Value Date   WBC 9.4 12/22/2019   HGB 12.6 (L) 12/22/2019   HCT 41.6 12/22/2019   MCV 87.2 12/22/2019   PLT 164 12/22/2019   Lab Results  Component Value Date   NA 138 12/22/2019   K 4.8 12/22/2019   CO2 28 12/22/2019   GLUCOSE 129 (H) 12/22/2019   BUN 15 12/22/2019   CREATININE 0.88 12/22/2019   BILITOT 0.9 12/21/2019   ALKPHOS 47 12/21/2019   AST 13 (L) 12/21/2019   ALT 9 12/21/2019   PROT 9.0 (H) 12/21/2019   ALBUMIN 4.0 12/21/2019   CALCIUM 9.1 12/22/2019   ANIONGAP 11 12/22/2019   Lab Results  Component Value Date   CHOL 146 11/24/2013   Lab Results  Component Value Date   HDL 22 (L) 11/24/2013   Lab Results  Component Value Date   LDLCALC 94 11/24/2013   Lab Results  Component Value Date   TRIG 152 11/24/2013   No results found for: CHOLHDL Lab Results  Component Value Date   HGBA1C 6.0 03/29/2020      Assessment & Plan:   Problem List Items Addressed This Visit      Cardiovascular and Mediastinum   Essential hypertension (Chronic)    Patient blood pressure is normal patient denies any chest pain or shortness of breath there is no history of palpitation paroxysmal nocturnal dyspnea patient can walkioo yards without any problem patient was advised to follow low-salt low-cholesterol diet  I reviewed the results of Sprint trial  ideally I want to keep systolic blood pressure below 130 mmHg, patient was asked to check blood pressure 3 times a week and give me a report on that.  Patient will be follow-up in 3 months, patient will call me back for any change in the cardiovascular symptoms           Respiratory   Sleep apnea    Patient is using CPAP machine        Endocrine   Diabetes mellitus ([place]) - Primary    - The patient's blood sugar is under control on med. - The patient  will continue the current treatment regimen.  - I encouraged the patient to regularly check blood sugar.  - I encouraged the patient to monitor diet. I encouraged the patient to eat low-carb and low-sugar to help prevent blood sugar spikes.  - I encouraged the patient to continue following their prescribed treatment plan for diabetes - I informed the patient to get help if blood sugar drops below 54mg /dL, or if suddenly have trouble thinking clearly or breathing.       Relevant Orders   POCT glucose (manual entry) (Completed)   POCT HgB A1C (Completed)     Other   Morbid obesity ([place])    - I encouraged the patient to lose weight.  - I educated them on making healthy dietary choices including eating more fruits and vegetables and less fried foods. - I encouraged the patient to exercise more, and educated on the benefits of exercise including weight loss, diabetes prevention, and hypertension prevention.        Somnolence    Increased sleepiness due to sleep apnea.  Patient was advised to use a CPAP machine on a regular basis.         No orders of the defined types were placed in this encounter.   Follow-up: No follow-ups on file.    Cletis Athens, MD

## 2020-04-28 ENCOUNTER — Ambulatory Visit: Payer: 59 | Admitting: Internal Medicine

## 2020-04-29 ENCOUNTER — Ambulatory Visit (INDEPENDENT_AMBULATORY_CARE_PROVIDER_SITE_OTHER): Payer: 59 | Admitting: Internal Medicine

## 2020-04-29 ENCOUNTER — Other Ambulatory Visit: Payer: Self-pay

## 2020-04-29 ENCOUNTER — Encounter: Payer: Self-pay | Admitting: Internal Medicine

## 2020-04-29 VITALS — BP 96/67 | HR 84 | Ht 73.0 in | Wt 290.2 lb

## 2020-04-29 DIAGNOSIS — E119 Type 2 diabetes mellitus without complications: Secondary | ICD-10-CM | POA: Diagnosis not present

## 2020-04-29 DIAGNOSIS — I10 Essential (primary) hypertension: Secondary | ICD-10-CM | POA: Diagnosis not present

## 2020-04-29 DIAGNOSIS — E089 Diabetes mellitus due to underlying condition without complications: Secondary | ICD-10-CM

## 2020-04-29 DIAGNOSIS — G4734 Idiopathic sleep related nonobstructive alveolar hypoventilation: Secondary | ICD-10-CM

## 2020-04-29 LAB — GLUCOSE, POCT (MANUAL RESULT ENTRY): POC Glucose: 123 mg/dl — AB (ref 70–99)

## 2020-04-29 NOTE — Assessment & Plan Note (Signed)
-   Refer to sleep specialist  

## 2020-04-29 NOTE — Assessment & Plan Note (Addendum)

## 2020-04-29 NOTE — Progress Notes (Signed)
Established Patient Office Visit  Subjective:  Patient ID: John Flinchum., male    DOB: 06-Jun-1975  Age: 45 y.o. MRN: [id number]  CC:  Chief Complaint  Patient presents with  . Diabetes     ______________________________________________________________________    Diabetes    John Dawley. presents for blood sugar check patient does not have any neurological deficit, patient is alert well orientated Cranial nerves intact there is no facial asymmetry, no visual field deficit, no dysarthria, Sensations are intact Motor functions are intact without any tremors abnormal tone or atrophy Coordination intact Romberg negative Gait intact/blood sugar is under control, he has hypertension so his Lasix was stopped the Cozaar was also stopped.  He is going to see the sleep specialist on second May  Past Medical History:  Diagnosis Date  . Anemia   . Asthma   . Diabetes mellitus   . 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 Morbid obesity ([place])   . Paranoid schizophrenia ([place])   . Personality disorder ([place])   . Sleep apnea   . Thyroid disease     Past Surgical History:  Procedure Laterality Date  . FRACTURE SURGERY      Family History  Problem Relation Age of Onset  . Glaucoma Mother     Social History   Socioeconomic History  . Marital status: Single    Spouse name: Not on file  . Number of children: Not on file  . Years of education: Not on file  . Highest education level: Not on file  Occupational History  . Not on file  Tobacco Use  . Smoking status: Former Research scientist (life sciences)  . Smokeless tobacco: Never Used  Vaping Use  . Vaping Use: Never used  Substance and Sexual Activity  . Alcohol use: Not Currently  . Drug use: No  . Sexual activity: Not on file  Other Topics Concern  . Not on file  Social History Narrative  . Not on file   Social Determinants of Health   Financial Resource Strain: Not on file  Food Insecurity: Not on file   Transportation Needs: Not on file  Physical Activity: Not on file  Stress: Not on file  Social Connections: Not on file  Intimate Partner Violence: Not on file     Current Outpatient Medications:  .  Accu-Chek Softclix Lancets lancets, Use as instructed, Disp: 100 each, Rfl: 12 .  albuterol (PROVENTIL) (2.5 MG/3ML) 0.083% nebulizer solution, USE 1 VIAL VIA NEBULIZER 3 TIMES A DAY, Disp: 300 mL, Rfl: 6 .  Alcohol Swabs (B-D SINGLE USE SWABS REGULAR) PADS, USE AS DIRECTED FOR BLOOD SUGAR CHECKS., Disp: 100 each, Rfl: 10 .  ALLERGY RELIEF 10 MG tablet, TAKE ONE TABLET BY MOUTH EVERY DAY, Disp: 30 tablet, Rfl: 10 .  asenapine (SAPHRIS) 5 MG SUBL 24 hr tablet, Place 1 tablet (5 mg total) 2 (two) times daily under the tongue., Disp: 60 tablet, Rfl: 0 .  atenolol (TENORMIN) 100 MG tablet, Take 1 tablet (100 mg total) by mouth daily., Disp: 30 tablet, Rfl: 3 .  atorvastatin (LIPITOR) 10 MG tablet, TAKE 1 TABLET BY MOUTH EACH DAY., Disp: 30 tablet, Rfl: 10 .  cloZAPine (CLOZARIL) 100 MG tablet, Take 100 mg by mouth daily., Disp: , Rfl:  .  divalproex (DEPAKOTE ER) 500 MG 24 hr tablet, Take 2,000 mg by mouth at bedtime. , Disp: , Rfl:  .  glucose blood (TRUE METRIX BLOOD GLUCOSE TEST) test strip, FINGERSTICK  BLOOD SUGAR ONCE DAILY, Disp: 100 each, Rfl: 10 .  haloperidol (HALDOL) 5 MG tablet, Take 5 mg by mouth 2 (two) times daily., Disp: , Rfl:  .  levothyroxine (SYNTHROID) 75 MCG tablet, TAKE 1 TABLET BY MOUTH EACH DAY., Disp: 30 tablet, Rfl: 10 .  meloxicam (MOBIC) 15 MG tablet, Take 1 tablet (15 mg total) by mouth daily., Disp: 30 tablet, Rfl: 0 .  metFORMIN (GLUCOPHAGE) 1000 MG tablet, TAKE 1 TABLET BY MOUTH TWICE A DAY WITH FOOD, Disp: 60 tablet, Rfl: 4 .  omeprazole (PRILOSEC) 20 MG capsule, TAKE 1 CAPSULE BY MOUTH EVERY DAY, Disp: 30 capsule, Rfl: 6 .  oxybutynin (DITROPAN) 5 MG tablet, TAKE 1 TABLET BY MOUTH ONCE DAILY, Disp: 30 tablet, Rfl: 10 .  Semaglutide,0.25 or 0.5MG /DOS, (OZEMPIC,  0.25 OR 0.5 MG/DOSE,) 2 MG/1.5ML SOPN, Inject 0.5 mg into the skin once a week., Disp: 1.5 mL, Rfl: 10 .  sitaGLIPtin (JANUVIA) 100 MG tablet, Take 1 tablet (100 mg total) by mouth daily., Disp: 30 tablet, Rfl: 3   No Known Allergies  ROS Review of Systems  Constitutional: Negative.   HENT: Negative.   Eyes: Negative.   Respiratory: Negative.   Cardiovascular: Negative.   Gastrointestinal: Negative.   Endocrine: Negative.   Genitourinary: Negative.   Musculoskeletal: Negative.   Skin: Negative.   Allergic/Immunologic: Negative.   Neurological: Negative.   Hematological: Negative.   Psychiatric/Behavioral: Negative.   All other systems reviewed and are negative.     Objective:    Physical Exam Vitals reviewed.  Constitutional:      Appearance: Normal appearance.  HENT:     Mouth/Throat:     Mouth: Mucous membranes are moist.  Eyes:     Pupils: Pupils are equal, round, and reactive to light.  Neck:     Vascular: No carotid bruit.  Cardiovascular:     Rate and Rhythm: Normal rate and regular rhythm.     Pulses: Normal pulses.     Heart sounds: Normal heart sounds.  Pulmonary:     Effort: Pulmonary effort is normal.     Breath sounds: Normal breath sounds.  Abdominal:     General: Bowel sounds are normal.     Palpations: Abdomen is soft. There is no hepatomegaly, splenomegaly or mass.     Tenderness: There is no abdominal tenderness.     Hernia: No hernia is present.  Musculoskeletal:     Cervical back: Neck supple.     Right lower leg: No edema.     Left lower leg: No edema.  Skin:    Findings: No rash.  Neurological:     Mental Status: He is alert and oriented to person, place, and time.     Motor: No weakness.  Psychiatric:        Mood and Affect: Mood normal.        Behavior: Behavior normal.     BP 96/67   Pulse 84   Ht 6\' 1"  (1.854 m)   Wt 290 lb 3.2 oz (131.6 kg)   BMI 38.29 kg/m  Wt Readings from Last 3 Encounters:  04/29/20 290 lb 3.2 oz  (131.6 kg)  03/29/20 286 lb 3.2 oz (129.8 kg)  02/29/20 282 lb 8 oz (128.1 kg)     Health Maintenance Due  Topic Date Due  . Hepatitis C Screening  Never done  . COVID-19 Vaccine (1) Never done  . FOOT EXAM  Never done  . OPHTHALMOLOGY EXAM  Never done  .  URINE MICROALBUMIN  Never done  . TETANUS/TDAP  Never done  Foot exam does not show any ulcers or calluses, patient was referred eye doc.  Also podiatrist.  There are no preventive care reminders to display for this patient.  Lab Results  Component Value Date   TSH 1.842 11/01/2016   Lab Results  Component Value Date   WBC 9.4 12/22/2019   HGB 12.6 (L) 12/22/2019   HCT 41.6 12/22/2019   MCV 87.2 12/22/2019   PLT 164 12/22/2019   Lab Results  Component Value Date   NA 138 12/22/2019   K 4.8 12/22/2019   CO2 28 12/22/2019   GLUCOSE 129 (H) 12/22/2019   BUN 15 12/22/2019   CREATININE 0.88 12/22/2019   BILITOT 0.9 12/21/2019   ALKPHOS 47 12/21/2019   AST 13 (L) 12/21/2019   ALT 9 12/21/2019   PROT 9.0 (H) 12/21/2019   ALBUMIN 4.0 12/21/2019   CALCIUM 9.1 12/22/2019   ANIONGAP 11 12/22/2019   Lab Results  Component Value Date   CHOL 146 11/24/2013   Lab Results  Component Value Date   HDL 22 (L) 11/24/2013   Lab Results  Component Value Date   LDLCALC 94 11/24/2013   Lab Results  Component Value Date   TRIG 152 11/24/2013   No results found for: CHOLHDL Lab Results  Component Value Date   HGBA1C 6.0 03/29/2020      Assessment & Plan:   Problem List Items Addressed This Visit      Cardiovascular and Mediastinum   Essential hypertension (Chronic)    Blood pressure is low .stop  Cozaar/and lasix        Respiratory   Sleep apnea    Refer to sleep specialist        Endocrine   Diabetes mellitus ([place]) - Primary   Relevant Orders   POCT glucose (manual entry) (Completed)   Diabetes mellitus due to underlying condition without complication, without long-term current use of insulin ([place])     - The patient's blood sugar is under control on med. - The patient will continue the current treatment regimen.  - I encouraged the patient to regularly check blood sugar.  - I encouraged the patient to monitor diet. I encouraged the patient to eat low-carb and low-sugar to help prevent blood sugar spikes.  - I encouraged the patient to continue following their prescribed treatment plan for diabetes - I informed the patient to get help if blood sugar drops below 54mg /dL, or if suddenly have trouble thinking clearly or breathing.         Other   Morbid obesity ([place])    - I encouraged the patient to lose weight.  - I educated them on making healthy dietary choices including eating more fruits and vegetables and less fried foods. - I encouraged the patient to exercise more, and educated on the benefits of exercise including weight loss, diabetes prevention, and hypertension prevention.   Dietary counseling with a registered dietician  Referral to a weight management support group (e.g. Weight Watchers, Overeaters Anonymous)  If your BMI is greater than 29 or you have gained more than 15 pounds you should work on weight loss.  Attend a healthy cooking class-         No orders of the defined types were placed in this encounter.   Follow-up: No follow-ups on file.    Cletis Athens, MD

## 2020-04-29 NOTE — Assessment & Plan Note (Signed)

## 2020-04-29 NOTE — Assessment & Plan Note (Signed)
Blood pressure is low .stop  Cozaar/and lasix

## 2020-05-02 ENCOUNTER — Ambulatory Visit: Payer: 59 | Admitting: Internal Medicine

## 2020-05-02 NOTE — Progress Notes (Signed)
Pt did not show for scheduled appointment. Office staff will reach out to reschedule.  

## 2020-05-23 ENCOUNTER — Ambulatory Visit (INDEPENDENT_AMBULATORY_CARE_PROVIDER_SITE_OTHER): Payer: 59 | Admitting: Internal Medicine

## 2020-05-23 VITALS — BP 99/73 | HR 84 | Resp 18 | Ht 74.0 in | Wt 287.0 lb

## 2020-05-23 DIAGNOSIS — Z7189 Other specified counseling: Secondary | ICD-10-CM | POA: Insufficient documentation

## 2020-05-23 DIAGNOSIS — I10 Essential (primary) hypertension: Secondary | ICD-10-CM

## 2020-05-23 DIAGNOSIS — G4733 Obstructive sleep apnea (adult) (pediatric): Secondary | ICD-10-CM | POA: Diagnosis not present

## 2020-05-23 DIAGNOSIS — Z9989 Dependence on other enabling machines and devices: Secondary | ICD-10-CM

## 2020-05-23 DIAGNOSIS — Z6836 Body mass index (BMI) 36.0-36.9, adult: Secondary | ICD-10-CM | POA: Insufficient documentation

## 2020-05-23 NOTE — Progress Notes (Signed)
[facility] [place], [place] [address]  Pulmonary Sleep Medicine   Office Visit Note  Patient Name: John [name]. DOB: March 15, 1975 MRN [id number]    Chief Complaint: Obstructive Sleep Apnea visit  Brief History:  Sheriff is seen today for 8  Month follow up sleep consult on BiPAP 24/17 cmH2O due to severe decline in usage & increased AHI. The patient has a 4.5 year history of sleep apnea. Patient not using PAP nightly.   Patient said he is sleeping 9 - 10 hourswithout PAP.  The staff or patient reports no problems from PAP use. Reported sleepiness is  Feeling worse with out PAP therapyand the Epworth Sleepiness Score is 8 out of 24. The patient takes daily 1 - 2 hours naps. The staff or patient complains of the following: patient feels not breathing enough or strong enough stating he stopped using because he's not getting enough air.  The compliance download shows  compliance with an average use time of 3:11 hours. The AHI is 19.3  The patient denies restlessness or of limb movements disrupting sleep.  ROS  General: (-) fever, (-) chills, (-) night sweat Nose and Sinuses: (-) nasal stuffiness or itchiness, (-) postnasal drip, (-) nosebleeds, (-) sinus trouble. Mouth and Throat: (-) sore throat, (-) hoarseness. Neck: (-) swollen glands, (-) enlarged thyroid, (-) neck pain. Respiratory: - cough, - shortness of breath, - wheezing. Neurologic: - numbness, - tingling. Psychiatric: - anxiety, - depression   Current Medication: Outpatient Encounter Medications as of 05/23/2020  Medication Sig  . Accu-Chek Softclix Lancets lancets Use as instructed  . albuterol (PROVENTIL) (2.5 MG/3ML) 0.083% nebulizer solution USE 1 VIAL VIA NEBULIZER 3 TIMES A DAY  . Alcohol Swabs (B-D SINGLE USE SWABS REGULAR) PADS USE AS DIRECTED FOR BLOOD SUGAR CHECKS.  Marland Kitchen ALLERGY RELIEF 10 MG tablet TAKE ONE TABLET BY MOUTH EVERY DAY  . asenapine (SAPHRIS) 5 MG SUBL 24 hr tablet Place 1  tablet (5 mg total) 2 (two) times daily under the tongue.  Marland Kitchen atenolol (TENORMIN) 100 MG tablet Take 1 tablet (100 mg total) by mouth daily.  Marland Kitchen atorvastatin (LIPITOR) 10 MG tablet TAKE 1 TABLET BY MOUTH EACH DAY.  . cloZAPine (CLOZARIL) 100 MG tablet Take 100 mg by mouth daily.  . divalproex (DEPAKOTE ER) 500 MG 24 hr tablet Take 2,000 mg by mouth at bedtime.   Marland Kitchen glucose blood (TRUE METRIX BLOOD GLUCOSE TEST) test strip FINGERSTICK BLOOD SUGAR ONCE DAILY  . haloperidol (HALDOL) 5 MG tablet Take 5 mg by mouth 2 (two) times daily.  Marland Kitchen levothyroxine (SYNTHROID) 75 MCG tablet TAKE 1 TABLET BY MOUTH EACH DAY.  . meloxicam (MOBIC) 15 MG tablet Take 1 tablet (15 mg total) by mouth daily.  . metFORMIN (GLUCOPHAGE) 1000 MG tablet TAKE 1 TABLET BY MOUTH TWICE A DAY WITH FOOD  . omeprazole (PRILOSEC) 20 MG capsule TAKE 1 CAPSULE BY MOUTH EVERY DAY  . oxybutynin (DITROPAN) 5 MG tablet TAKE 1 TABLET BY MOUTH ONCE DAILY  . Semaglutide,0.25 or 0.5MG /DOS, (OZEMPIC, 0.25 OR 0.5 MG/DOSE,) 2 MG/1.5ML SOPN Inject 0.5 mg into the skin once a week.  . sitaGLIPtin (JANUVIA) 100 MG tablet Take 1 tablet (100 mg total) by mouth daily.   No facility-administered encounter medications on file as of 05/23/2020.    Surgical History: Past Surgical History:  Procedure Laterality Date  . FRACTURE SURGERY      Medical History: Past Medical History:  Diagnosis Date  . Anemia   .  Asthma   . Diabetes mellitus   . 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 Morbid obesity ([name])   . Paranoid schizophrenia ([name])   . Personality disorder ([name])   . Sleep apnea   . Thyroid disease     Family History: Non contributory to the present illness  Social History: Social History   Socioeconomic History  . Marital status: Single    Spouse name: Not on file  . Number of children: Not on file  . Years of education: Not on file  . Highest education level: Not on file  Occupational History  . Not on  file  Tobacco Use  . Smoking status: Former Games developer  . Smokeless tobacco: Never Used  Vaping Use  . Vaping Use: Never used  Substance and Sexual Activity  . Alcohol use: Not Currently  . Drug use: No  . Sexual activity: Not on file  Other Topics Concern  . Not on file  Social History Narrative  . Not on file   Social Determinants of Health   Financial Resource Strain: Not on file  Food Insecurity: Not on file  Transportation Needs: Not on file  Physical Activity: Not on file  Stress: Not on file  Social Connections: Not on file  Intimate Partner Violence: Not on file    Vital Signs: Blood pressure 99/73, pulse 84, resp. rate 18, height 6\' 2"  (1.88 m), weight 287 lb (130.2 kg), SpO2 96 %.  Examination: General Appearance: The patient is well-developed, well-nourished, and in no distress. Neck Circumference: 45cm Skin: Gross inspection of skin unremarkable. Head: normocephalic, no gross deformities. Eyes: no gross deformities noted. ENT: ears appear grossly normal Neurologic: Alert and oriented. No involuntary movements.    EPWORTH SLEEPINESS SCALE:  Scale:  (0)= no chance of dozing; (1)= slight chance of dozing; (2)= moderate chance of dozing; (3)= high chance of dozing  Chance  Situtation    Sitting and reading: 1    Watching TV: 1    Sitting Inactive in public: 1    As a passenger in car: 1      Lying down to rest: 1    Sitting and talking: 1    Sitting quielty after lunch: 1    In a car, stopped in traffic: 1   TOTAL SCORE:   8 out of 24    SLEEP STUDIES:  1. Split 11/2015 - AHI 69, low SpO2 84%, needs BiPAP   CPAP COMPLIANCE DATA:  Date Range: 09/22/19 - 05/18/20  Average Daily Use: 3:11 hours  Median Use: 3:35 hours  Compliance for > 4 Hours: 32% days  AHI: 19.3 respiratory events per hour  Days Used: 167/240 days  Mask Leak: 71.3 lpm  95th Percentile Pressure: 24/17 cmH2O         LABS: Recent Results (from the past  2160 hour(s))  POCT glucose (manual entry)     Status: Abnormal   Collection Time: 02/29/20  3:54 PM  Result Value Ref Range   POC Glucose 163 (A) 70 - 99 mg/dl  POCT glucose (manual entry)     Status: Abnormal   Collection Time: 03/29/20 10:01 AM  Result Value Ref Range   POC Glucose 134 (A) 70 - 99 mg/dl  POCT HgB U3Y     Status: Normal   Collection Time: 03/29/20 10:02 AM  Result Value Ref Range   HbA1c POC (<> result, manual entry) 6.0 4.0 - 5.6 %  POCT glucose (manual entry)  Status: Abnormal   Collection Time: 04/29/20 11:32 AM  Result Value Ref Range   POC Glucose 123 (A) 70 - 99 mg/dl    Radiology: DG Finger Thumb Right  Result Date: 12/29/2019 CLINICAL DATA:  [place] last week, pain EXAM: RIGHT THUMB 2+V COMPARISON:  None. FINDINGS: Frontal, oblique, and lateral views of the right first digit are obtained. No acute fracture, subluxation, or dislocation. Mild osteoarthritis of the first carpometacarpal and metacarpophalangeal joints. Soft tissues are unremarkable. IMPRESSION: 1. Mild osteoarthritis.  No acute bony abnormality. Electronically Signed   By: Randa Ngo M.D.   On: 12/29/2019 14:59    No results found.  No results found.    Assessment and Plan: Patient Active Problem List   Diagnosis Date Noted  . OSA on CPAP 05/23/2020  . CPAP use counseling 05/23/2020  . BMI 36.0-36.9,adult 05/23/2020  . Bilateral leg weakness 08/17/2019  . Knee arthropathy 08/10/2019  . Acute gout due to renal impairment involving right foot 07/08/2019  . Encephalopathy acute   . Somnolence 11/01/2016  . AKI (acute kidney injury) ([place]) 11/01/2016  . Sepsis ([place]) 04/27/2016  . Respiratory failure ([place]) 12/20/2015  . Myocardiopathy ([place]) 11/28/2015  . Tobacco abuse 11/28/2015  . Acute respiratory failure with hypercapnia ([place])   . Sleep apnea   . Essential hypertension 11/25/2015  . Hypothyroidism 11/25/2015  . Paranoid schizophrenia, chronic condition ([place]) 11/25/2015  .  Acute respiratory failure ([place])   . Acute exacerbation of COPD with asthma ([place])   . Diabetes mellitus ([place])   . Diabetes mellitus due to underlying condition without complication, without long-term current use of insulin ([place])   . Morbid obesity ([place]) 09/29/2008    1. OSA on CPAP The patient does  tolerate PAP and reports no recent  benefit from PAP use. He has stopped using it recently. He is willing to restart. He has felt like his pressure was too low. We will adjust his starting  epap to 9 and see how he does with that. The patient was reminded how to clean his equipment and advised to  Change masks to one that connects in front. The patient was also counselled on replacing supplies . The compliance is poor. The AHI is 19.3. OSA- increase compliance. Will adjust epap to 9. F/u in 6-8 weeks.    2. CPAP use counseling CPAP Counseling: had a lengthy discussion with the patient regarding the importance of PAP therapy in management of the sleep apnea. Patient appears to understand the risk factor reduction and also understands the risks associated with untreated sleep apnea. Patient will try to make a good faith effort to remain compliant with therapy. Also instructed the patient on proper cleaning of the device including the water must be changed daily if possible and use of distilled water is preferred. Patient understands that the machine should be regularly cleaned with appropriate recommended cleaning solutions that do not damage the PAP machine for example given white vinegar and water rinses. Other methods such as ozone treatment may not be as good as these simple methods to achieve cleaning.  3. Essential hypertension .possibly overcontrolled. Pt will f/u with PCP.   4. BMI 36.0-36.9,adult Obesity Counseling: Had a lengthy discussion regarding patients BMI and weight issues. Patient was instructed on portion control as well as increased activity. Also discussed caloric restrictions with  trying to maintain intake less than 2000 Kcal. Discussions were made in accordance with the 5As of weight management. Simple actions such as not eating late and  if able to, taking a walk is suggested.  General Counseling: I have discussed the findings of the evaluation and examination with Sonia Side.  I have also discussed any further diagnostic evaluation thatmay be needed or ordered today. Blake verbalizes understanding of the findings of todays visit. We also reviewed his medications today and discussed drug interactions and side effects including but not limited excessive drowsiness and altered mental states. We also discussed that there is always a risk not just to him but also people around him. he has been encouraged to call the office with any questions or concerns that should arise related to todays visit.  No orders of the defined types were placed in this encounter.       I have personally obtained a history, examined the patient, evaluated laboratory and imaging results, formulated the assessment and plan and placed orders.   This patient was seen today by Tressie Ellis, PA-C in collaboration with Dr. Devona Konig.    Allyne Gee, MD [facility] Diplomate ABMS Pulmonary and Critical Care Medicine Sleep medicine

## 2020-05-23 NOTE — Patient Instructions (Signed)

## 2020-05-31 ENCOUNTER — Other Ambulatory Visit: Payer: Self-pay

## 2020-05-31 ENCOUNTER — Ambulatory Visit (INDEPENDENT_AMBULATORY_CARE_PROVIDER_SITE_OTHER): Payer: 59 | Admitting: Internal Medicine

## 2020-05-31 ENCOUNTER — Encounter: Payer: Self-pay | Admitting: Internal Medicine

## 2020-05-31 VITALS — BP 107/76 | HR 82 | Ht 74.0 in | Wt 285.1 lb

## 2020-05-31 DIAGNOSIS — I10 Essential (primary) hypertension: Secondary | ICD-10-CM

## 2020-05-31 DIAGNOSIS — E1169 Type 2 diabetes mellitus with other specified complication: Secondary | ICD-10-CM | POA: Diagnosis not present

## 2020-05-31 DIAGNOSIS — E038 Other specified hypothyroidism: Secondary | ICD-10-CM | POA: Diagnosis not present

## 2020-05-31 DIAGNOSIS — G4734 Idiopathic sleep related nonobstructive alveolar hypoventilation: Secondary | ICD-10-CM | POA: Diagnosis not present

## 2020-05-31 DIAGNOSIS — E119 Type 2 diabetes mellitus without complications: Secondary | ICD-10-CM

## 2020-05-31 LAB — GLUCOSE, POCT (MANUAL RESULT ENTRY): POC Glucose: 115 mg/dl — AB (ref 70–99)

## 2020-05-31 NOTE — Assessment & Plan Note (Signed)
Patient was advised to use a CPAP machine.

## 2020-05-31 NOTE — Assessment & Plan Note (Addendum)
-   The patient's blood sugar is under control on med. - The patient will continue the current treatment regimen.  - I encouraged the patient to regularly check blood sugar.   - I informed the patient to get help if blood sugar drops below 54mg /dL, or if suddenly have trouble thinking clearly or breathing.  Patient was advised to buy a book on diabetes from a local bookstore or from [place]

## 2020-05-31 NOTE — Assessment & Plan Note (Signed)

## 2020-05-31 NOTE — Assessment & Plan Note (Signed)
Stable

## 2020-05-31 NOTE — Assessment & Plan Note (Addendum)
Blood pressure is stable and normal.  I stopped the Lasix.  I continued the lisinopril to get the benefit of kidney protection because of his diabetes.  We will stop the lisinopril if He continues to have low blood pressure.  I advised him to drink a lot of water.

## 2020-05-31 NOTE — Progress Notes (Signed)
Established Patient Office Visit  Subjective:  Patient ID: John [name]., male    DOB: Jul 31, 1975  Age: 45 y.o. MRN: [id number]  CC:  Chief Complaint  Patient presents with  . Diabetes    HPI  John [name]. presents for check up, patient blood pressure is low so we have stopped the Lasix.  Patient was advised to drink 4 to 6 glasses of water a day.  He was also advised to use a CPAP machine on a regular basis  Past Medical History:  Diagnosis Date  . Anemia   . Asthma   . Diabetes mellitus   . 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 Morbid obesity ([place])   . Paranoid schizophrenia ([place])   . Personality disorder ([place])   . Sleep apnea   . Thyroid disease     Past Surgical History:  Procedure Laterality Date  . FRACTURE SURGERY      Family History  Problem Relation Age of Onset  . Glaucoma Mother     Social History   Socioeconomic History  . Marital status: Single    Spouse name: Not on file  . Number of children: Not on file  . Years of education: Not on file  . Highest education level: Not on file  Occupational History  . Not on file  Tobacco Use  . Smoking status: Former Research scientist (life sciences)  . Smokeless tobacco: Never Used  Vaping Use  . Vaping Use: Never used  Substance and Sexual Activity  . Alcohol use: Not Currently  . Drug use: No  . Sexual activity: Not on file  Other Topics Concern  . Not on file  Social History Narrative  . Not on file   Social Determinants of Health   Financial Resource Strain: Not on file  Food Insecurity: Not on file  Transportation Needs: Not on file  Physical Activity: Not on file  Stress: Not on file  Social Connections: Not on file  Intimate Partner Violence: Not on file     Current Outpatient Medications:  .  Accu-Chek Softclix Lancets lancets, Use as instructed, Disp: 100 each, Rfl: 12 .  albuterol (PROVENTIL) (2.5 MG/3ML) 0.083% nebulizer solution, USE 1 VIAL VIA NEBULIZER 3 TIMES A  DAY, Disp: 300 mL, Rfl: 6 .  Alcohol Swabs (B-D SINGLE USE SWABS REGULAR) PADS, USE AS DIRECTED FOR BLOOD SUGAR CHECKS., Disp: 100 each, Rfl: 10 .  ALLERGY RELIEF 10 MG tablet, TAKE ONE TABLET BY MOUTH EVERY DAY, Disp: 30 tablet, Rfl: 10 .  asenapine (SAPHRIS) 5 MG SUBL 24 hr tablet, Place 1 tablet (5 mg total) 2 (two) times daily under the tongue., Disp: 60 tablet, Rfl: 0 .  atenolol (TENORMIN) 100 MG tablet, Take 1 tablet (100 mg total) by mouth daily., Disp: 30 tablet, Rfl: 3 .  atorvastatin (LIPITOR) 10 MG tablet, TAKE 1 TABLET BY MOUTH EACH DAY., Disp: 30 tablet, Rfl: 10 .  cloZAPine (CLOZARIL) 100 MG tablet, Take 100 mg by mouth daily., Disp: , Rfl:  .  divalproex (DEPAKOTE ER) 500 MG 24 hr tablet, Take 2,000 mg by mouth at bedtime. , Disp: , Rfl:  .  glucose blood (TRUE METRIX BLOOD GLUCOSE TEST) test strip, FINGERSTICK BLOOD SUGAR ONCE DAILY, Disp: 100 each, Rfl: 10 .  haloperidol (HALDOL) 5 MG tablet, Take 5 mg by mouth 2 (two) times daily., Disp: , Rfl:  .  levothyroxine (SYNTHROID) 75 MCG tablet, TAKE 1 TABLET BY MOUTH EACH DAY., Disp:  30 tablet, Rfl: 10 .  meloxicam (MOBIC) 15 MG tablet, Take 1 tablet (15 mg total) by mouth daily., Disp: 30 tablet, Rfl: 0 .  metFORMIN (GLUCOPHAGE) 1000 MG tablet, TAKE 1 TABLET BY MOUTH TWICE A DAY WITH FOOD, Disp: 60 tablet, Rfl: 4 .  omeprazole (PRILOSEC) 20 MG capsule, TAKE 1 CAPSULE BY MOUTH EVERY DAY, Disp: 30 capsule, Rfl: 6 .  oxybutynin (DITROPAN) 5 MG tablet, TAKE 1 TABLET BY MOUTH ONCE DAILY, Disp: 30 tablet, Rfl: 10 .  Semaglutide,0.25 or 0.5MG /DOS, (OZEMPIC, 0.25 OR 0.5 MG/DOSE,) 2 MG/1.5ML SOPN, Inject 0.5 mg into the skin once a week., Disp: 1.5 mL, Rfl: 10 .  sitaGLIPtin (JANUVIA) 100 MG tablet, Take 1 tablet (100 mg total) by mouth daily., Disp: 30 tablet, Rfl: 3   No Known Allergies  ROS Review of Systems  Constitutional: Negative.   HENT: Negative.   Eyes: Negative.   Respiratory: Negative.   Cardiovascular: Negative.    Gastrointestinal: Negative.   Endocrine: Negative.   Genitourinary: Negative.   Musculoskeletal: Negative.   Skin: Negative.   Allergic/Immunologic: Negative.   Neurological: Negative.   Hematological: Negative.   Psychiatric/Behavioral: Negative.   All other systems reviewed and are negative.     Objective:    Physical Exam Vitals reviewed.  Constitutional:      Appearance: Normal appearance.  HENT:     Mouth/Throat:     Mouth: Mucous membranes are moist.  Eyes:     Pupils: Pupils are equal, round, and reactive to light.  Neck:     Vascular: No carotid bruit.  Cardiovascular:     Rate and Rhythm: Normal rate and regular rhythm.     Pulses: Normal pulses.     Heart sounds: Normal heart sounds.  Pulmonary:     Effort: Pulmonary effort is normal.     Breath sounds: Normal breath sounds.  Abdominal:     General: Bowel sounds are normal.     Palpations: Abdomen is soft. There is no hepatomegaly, splenomegaly or mass.     Tenderness: There is no abdominal tenderness.     Hernia: No hernia is present.  Musculoskeletal:     Cervical back: Neck supple.     Right lower leg: No edema.     Left lower leg: No edema.  Skin:    Findings: No rash.  Neurological:     Mental Status: He is alert and oriented to person, place, and time.     Motor: No weakness.  Psychiatric:        Mood and Affect: Mood normal.        Behavior: Behavior normal.     BP 107/76   Pulse 82   Ht 6\' 2"  (1.88 m)   Wt 285 lb 1.6 oz (129.3 kg)   BMI 36.60 kg/m  Wt Readings from Last 3 Encounters:  05/31/20 285 lb 1.6 oz (129.3 kg)  05/23/20 287 lb (130.2 kg)  04/29/20 290 lb 3.2 oz (131.6 kg)     Health Maintenance Due  Topic Date Due  . COVID-19 Vaccine (1) Never done  . FOOT EXAM  Never done  . OPHTHALMOLOGY EXAM  Never done  . URINE MICROALBUMIN  Never done  . Hepatitis C Screening  Never done  . TETANUS/TDAP  Never done    There are no preventive care reminders to display for this  patient.  Lab Results  Component Value Date   TSH 1.842 11/01/2016   Lab Results  Component Value Date  WBC 9.4 12/22/2019   HGB 12.6 (L) 12/22/2019   HCT 41.6 12/22/2019   MCV 87.2 12/22/2019   PLT 164 12/22/2019   Lab Results  Component Value Date   NA 138 12/22/2019   K 4.8 12/22/2019   CO2 28 12/22/2019   GLUCOSE 129 (H) 12/22/2019   BUN 15 12/22/2019   CREATININE 0.88 12/22/2019   BILITOT 0.9 12/21/2019   ALKPHOS 47 12/21/2019   AST 13 (L) 12/21/2019   ALT 9 12/21/2019   PROT 9.0 (H) 12/21/2019   ALBUMIN 4.0 12/21/2019   CALCIUM 9.1 12/22/2019   ANIONGAP 11 12/22/2019   Lab Results  Component Value Date   CHOL 146 11/24/2013   Lab Results  Component Value Date   HDL 22 (L) 11/24/2013   Lab Results  Component Value Date   LDLCALC 94 11/24/2013   Lab Results  Component Value Date   TRIG 152 11/24/2013   No results found for: CHOLHDL Lab Results  Component Value Date   HGBA1C 6.0 03/29/2020      Assessment & Plan:   Problem List Items Addressed This Visit      Cardiovascular and Mediastinum   Essential hypertension (Chronic)    Blood pressure is stable and normal.  I stopped the Lasix.  I continued the lisinopril to get the benefit of kidney protection because of his diabetes.  We will stop the lisinopril if He continues to have low blood pressure.  I advised him to drink a lot of water.        Respiratory   Sleep apnea    Patient was advised to use a CPAP machine.        Endocrine   Hypothyroidism (Chronic)    Stable      Diabetes mellitus ([place]) - Primary    - The patient's blood sugar is under control on med. - The patient will continue the current treatment regimen.  - I encouraged the patient to regularly check blood sugar.   - I informed the patient to get help if blood sugar drops below 54mg /dL, or if suddenly have trouble thinking clearly or breathing.  Patient was advised to buy a book on diabetes from a local bookstore or  from [place]       Relevant Orders   POCT glucose (manual entry) (Completed)     Other   Morbid obesity ([place])    - I encouraged the patient to lose weight.  - I educated them on making healthy dietary choices including eating more fruits and vegetables and less fried foods. - I encouraged the patient to exercise more, and educated on the benefits of exercise including weight loss, diabetes prevention, and hypertension prevention.   Dietary counseling with a registered dietician  Referral to a weight management support group (e.g. Weight Watchers, Overeaters Anonymous)  If your BMI is greater than 29 or you have gained more than 15 pounds you should work on weight loss.  Attend a healthy cooking class          No orders of the defined types were placed in this encounter.   Follow-up: No follow-ups on file.    Cletis Athens, MD

## 2020-06-28 ENCOUNTER — Ambulatory Visit: Payer: 59 | Admitting: Internal Medicine

## 2020-08-01 ENCOUNTER — Other Ambulatory Visit: Payer: Self-pay | Admitting: Internal Medicine

## 2020-08-05 ENCOUNTER — Other Ambulatory Visit: Payer: Self-pay | Admitting: Internal Medicine

## 2020-08-08 ENCOUNTER — Ambulatory Visit (INDEPENDENT_AMBULATORY_CARE_PROVIDER_SITE_OTHER): Payer: 59 | Admitting: Internal Medicine

## 2020-08-08 ENCOUNTER — Other Ambulatory Visit: Payer: Self-pay

## 2020-08-08 VITALS — BP 117/57 | HR 97 | Resp 18 | Ht 74.0 in | Wt 288.8 lb

## 2020-08-08 DIAGNOSIS — E669 Obesity, unspecified: Secondary | ICD-10-CM | POA: Diagnosis not present

## 2020-08-08 DIAGNOSIS — G4733 Obstructive sleep apnea (adult) (pediatric): Secondary | ICD-10-CM

## 2020-08-08 DIAGNOSIS — I10 Essential (primary) hypertension: Secondary | ICD-10-CM | POA: Diagnosis not present

## 2020-08-08 DIAGNOSIS — I420 Dilated cardiomyopathy: Secondary | ICD-10-CM

## 2020-08-08 NOTE — Progress Notes (Signed)
[facility] [place], [place] [address]  Pulmonary Sleep Medicine   Office Visit Note  Patient Name: John [name]. DOB: 1975/08/07 MRN [id number]    Chief Complaint: Obstructive Sleep Apnea visit  Brief History:  John [name] is seen today for 8 weeks follow up for compliance and last visit c/o no feeling like he is getting enough air; starting ramp set to 15/9 cmH2O for 15 minute interval. .  The patient has a 4.5 year history of sleep apnea. Patient is not using PAP nightly.  The patient feels better after sleeping with BiPAP when he can use it.  The patient reports benefiting from PAP use.  Epworth Sleepiness Score is 12 out of 24. The patient takes 1-2 naps daily after each meal. The patient complains of the following: machine cutting off a lot and he doesn't use it because of drooling.  The compliance download shows  compliance with an average use time of 54 minutes/hour @ 0%. The AHI is 42.2  The patient does not complain of limb movements disrupting sleep.  ROS  General: (-) fever, (-) chills, (-) night sweat Nose and Sinuses: (-) nasal stuffiness or itchiness, (-) postnasal drip, (-) nosebleeds, (-) sinus trouble. Mouth and Throat: (-) sore throat, (-) hoarseness. Neck: (-) swollen glands, (-) enlarged thyroid, (-) neck pain. Respiratory: - cough, - shortness of breath, - wheezing. Neurologic: - numbness, - tingling. Psychiatric: he sees the psychiatrist monthly  to follow up for his paranoid schizophrenia. Stable per pt report    Current Medication: Outpatient Encounter Medications as of 08/08/2020  Medication Sig   Accu-Chek Softclix Lancets lancets Use as instructed   albuterol (PROVENTIL) (2.5 MG/3ML) 0.083% nebulizer solution USE 1 VIAL VIA NEBULIZER 3 TIMES A DAY   Alcohol Swabs (B-D SINGLE USE SWABS REGULAR) PADS USE AS DIRECTED FOR BLOOD SUGAR CHECKS.   ALLERGY RELIEF 10 MG tablet TAKE ONE TABLET BY MOUTH EVERY DAY   asenapine (SAPHRIS) 5 MG  SUBL 24 hr tablet Place 1 tablet (5 mg total) 2 (two) times daily under the tongue.   atenolol (TENORMIN) 100 MG tablet Take 1 tablet (100 mg total) by mouth daily.   atorvastatin (LIPITOR) 10 MG tablet TAKE 1 TABLET BY MOUTH DAILY   cloZAPine (CLOZARIL) 100 MG tablet Take 100 mg by mouth daily.   divalproex (DEPAKOTE ER) 500 MG 24 hr tablet Take 2,000 mg by mouth at bedtime.    glucose blood (TRUE METRIX BLOOD GLUCOSE TEST) test strip FINGERSTICK BLOOD SUGAR ONCE DAILY   haloperidol (HALDOL) 5 MG tablet Take 5 mg by mouth 2 (two) times daily.   levothyroxine (SYNTHROID) 75 MCG tablet TAKE 1 TABLET BY MOUTH EVERY DAY ON AN EMPTY STOMACH   meloxicam (MOBIC) 15 MG tablet Take 1 tablet (15 mg total) by mouth daily.   metFORMIN (GLUCOPHAGE) 1000 MG tablet TAKE 1 TABLET BY MOUTH TWICE A DAY WITH FOOD   omeprazole (PRILOSEC) 20 MG capsule TAKE 1 CAPSULE BY MOUTH DAILY   oxybutynin (DITROPAN) 5 MG tablet TAKE 1 TABLET BY MOUTH EVERY DAY   Semaglutide,0.25 or 0.'5MG'$ /DOS, (OZEMPIC, 0.25 OR 0.5 MG/DOSE,) 2 MG/1.5ML SOPN Inject 0.5 mg into the skin once a week.   sitaGLIPtin (JANUVIA) 100 MG tablet Take 1 tablet (100 mg total) by mouth daily.   No facility-administered encounter medications on file as of 08/08/2020.    Surgical History: Past Surgical History:  Procedure Laterality Date   FRACTURE SURGERY      Medical History:  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 tophus 07/20/2019   Morbid obesity ([place])    Paranoid schizophrenia ([place])    Personality disorder ([place])    Sleep apnea    Thyroid disease     Family History: Non contributory to the present illness  Social History: Social History   Socioeconomic History   Marital status: Single    Spouse name: Not on file   Number of children: Not on file   Years of education: Not on file   Highest education level: Not on file  Occupational History    Not on file  Tobacco Use   Smoking status: Former   Smokeless tobacco: Never  Vaping Use   Vaping Use: Never used  Substance and Sexual Activity   Alcohol use: Not Currently   Drug use: No   Sexual activity: Not on file  Other Topics Concern   Not on file  Social History Narrative   Not on file   Social Determinants of Health   Financial Resource Strain: Not on file  Food Insecurity: Not on file  Transportation Needs: Not on file  Physical Activity: Not on file  Stress: Not on file  Social Connections: Not on file  Intimate Partner Violence: Not on file    Vital Signs: Blood pressure (!) 117/57, pulse 97, resp. rate 18, height '6\' 2"'$  (1.88 m), weight 288 lb 12.8 oz (131 kg).  Examination: General Appearance: The patient is well-developed, well-nourished, and in no distress. Neck Circumference: 45 Skin: Gross inspection of skin unremarkable. Head: normocephalic, no gross deformities. Eyes: no gross deformities noted. ENT: ears appear grossly normal Neurologic: Alert and oriented. No involuntary movements.    EPWORTH SLEEPINESS SCALE:  Scale:  (0)= no chance of dozing; (1)= slight chance of dozing; (2)= moderate chance of dozing; (3)= high chance of dozing  Chance  Situtation    Sitting and reading: 3    Watching TV: 3    Sitting Inactive in public: 0    As a passenger in car: 0      Lying down to rest: 3    Sitting and talking: 0    Sitting quielty after lunch: 3    In a car, stopped in traffic: 0   TOTAL SCORE:   12 out of 24    SLEEP STUDIES:  Split 11/2015 - AHI 69, low SpO2 84%, needs BiPAP   CPAP COMPLIANCE DATA:  Date Range: 06/05/20-08/03/20  Average Daily Use: 54 minutes/hour  Median Use: 55 minutes/hour  Compliance for > 4 Hours: 0%  AHI: 42.2 respiratory events per hour  Days Used: 53/60 days  Mask Leak: 32.9  95th Percentile Pressure: 24/17 cmH2O         LABS: Recent Results (from the past 2160 hour(s))  POCT  glucose (manual entry)     Status: Abnormal   Collection Time: 05/31/20  3:08 PM  Result Value Ref Range   POC Glucose 115 (A) 70 - 99 mg/dl    Radiology: DG Finger Thumb Right  Result Date: 12/29/2019 CLINICAL DATA:  [place] last week, pain EXAM: RIGHT THUMB 2+V COMPARISON:  None. FINDINGS: Frontal, oblique, and lateral views of the right first digit are obtained. No acute fracture, subluxation, or dislocation. Mild osteoarthritis of the first carpometacarpal and metacarpophalangeal joints. Soft tissues are unremarkable. IMPRESSION: 1. Mild osteoarthritis.  No acute bony abnormality. Electronically Signed  By: Randa Ngo M.D.   On: 12/29/2019 14:59    No results found.  No results found.    Assessment and Plan: Patient Active Problem List   Diagnosis Date Noted   OSA treated with BiPAP 08/08/2020   Obesity (BMI 30-39.9) 08/08/2020   OSA on CPAP 05/23/2020   CPAP use counseling 05/23/2020   BMI 36.0-36.9,adult 05/23/2020   Bilateral leg weakness 08/17/2019   Knee arthropathy 08/10/2019   Acute gout due to renal impairment involving right foot 07/08/2019   Encephalopathy acute    Somnolence 11/01/2016   AKI (acute kidney injury) ([place]) 11/01/2016   Sepsis ([place]) 04/27/2016   Respiratory failure ([place]) 12/20/2015   Myocardiopathy ([place]) 11/28/2015   Tobacco abuse 11/28/2015   Acute respiratory failure with hypercapnia ([name])    Sleep apnea    Essential hypertension 11/25/2015   Hypothyroidism 11/25/2015   Paranoid schizophrenia, chronic condition ([place]) 11/25/2015   Acute respiratory failure ([name])    Acute exacerbation of COPD with asthma ([place])    Diabetes mellitus ([place])    Diabetes mellitus due to underlying condition without complication, without long-term current use of insulin ([place])    Morbid obesity ([place]) 09/29/2008  1. OSA treated with BiPAP The patient does  tolerate PAP and reports  benefit from PAP use. The patient was reminded how to clean equipment and advised to  use his machine more consistently. The patient was also counselled on replacing supplies consistently. The compliance is poor. The AHI is 42.2, which has worsened. His leak has improved but his ahi has worsened. Will try turning off ramp, adjusting comfort settings and setting up close follow up. Emphasized importance of using his cpap for longer than he has been. .   OSA- improve compliance. F/u in 35m download in 3w   2. Essential hypertension Hypertension Counseling:   The following hypertensive lifestyle modification were recommended and discussed:  1. Limiting alcohol intake to less than 1 oz/day of ethanol:(24 oz of beer or 8 oz of wine or 2 oz of 100-proof whiskey). 2. Take baby ASA 81 mg daily. 3. Importance of regular aerobic exercise and losing weight. 4. Reduce dietary saturated fat and cholesterol intake for overall cardiovascular health. 5. Maintaining adequate dietary potassium, calcium, and magnesium intake. 6. Regular monitoring of the blood pressure. 7. Reduce sodium intake to less than 100 mmol/day (less than 2.3 gm of sodium or less than 6 gm of sodium choride)    3. Dilated cardiomyopathy ([place] Continue f/u per cardiology. Stable  4. Obesity (BMI 30-39.9) Obesity Counseling: Had a lengthy discussion regarding patients BMI and weight issues. Patient was instructed on portion control as well as increased activity. Also discussed caloric restrictions with trying to maintain intake less than 2000 Kcal. Discussions were made in accordance with the 5As of weight management. Simple actions such as not eating late and if able to, taking a walk is suggested.     General Counseling: I have discussed the findings of the evaluation and examination with JSonia Side  I have also discussed any further diagnostic evaluation thatmay be needed or ordered today. JFindleyverbalizes understanding of the findings of todays visit. We also reviewed his medications today and discussed drug interactions  and side effects including but not limited excessive drowsiness and altered mental states. We also discussed that there is always a risk not just to him but also people around him. he has been encouraged to call the office with any questions or concerns that should arise related to  todays visit.  No orders of the defined types were placed in this encounter.       I have personally obtained a history, examined the patient, evaluated laboratory and imaging results, formulated the assessment and plan and placed orders.   This patient was seen today by Tressie Ellis, PA-C in collaboration with Dr. Devona Konig.    Allyne Gee, MD [facility] Diplomate ABMS Pulmonary and Critical Care Medicine Sleep medicine

## 2020-08-08 NOTE — Patient Instructions (Signed)

## 2020-08-23 ENCOUNTER — Other Ambulatory Visit: Payer: Self-pay

## 2020-08-23 ENCOUNTER — Ambulatory Visit (INDEPENDENT_AMBULATORY_CARE_PROVIDER_SITE_OTHER): Payer: 59 | Admitting: Internal Medicine

## 2020-08-23 ENCOUNTER — Encounter: Payer: Self-pay | Admitting: Internal Medicine

## 2020-08-23 VITALS — BP 106/74 | HR 103 | Temp 100.6°F | Ht 74.0 in | Wt 282.1 lb

## 2020-08-23 DIAGNOSIS — E119 Type 2 diabetes mellitus without complications: Secondary | ICD-10-CM | POA: Diagnosis not present

## 2020-08-23 DIAGNOSIS — J40 Bronchitis, not specified as acute or chronic: Secondary | ICD-10-CM

## 2020-08-23 DIAGNOSIS — G4734 Idiopathic sleep related nonobstructive alveolar hypoventilation: Secondary | ICD-10-CM

## 2020-08-23 DIAGNOSIS — J029 Acute pharyngitis, unspecified: Secondary | ICD-10-CM | POA: Diagnosis not present

## 2020-08-23 DIAGNOSIS — R400 Somnolence: Secondary | ICD-10-CM

## 2020-08-23 LAB — CBC WITH DIFFERENTIAL/PLATELET
Absolute Monocytes: 1403 cells/uL — ABNORMAL HIGH (ref 200–950)
Basophils Absolute: 30 cells/uL (ref 0–200)
Basophils Relative: 0.4 %
Eosinophils Absolute: 23 cells/uL (ref 15–500)
Eosinophils Relative: 0.3 %
HCT: 41.9 % (ref 38.5–50.0)
Hemoglobin: 13.3 g/dL (ref 13.2–17.1)
Lymphs Abs: 1755 cells/uL (ref 850–3900)
MCH: 26.4 pg — ABNORMAL LOW (ref 27.0–33.0)
MCHC: 31.7 g/dL — ABNORMAL LOW (ref 32.0–36.0)
MCV: 83.3 fL (ref 80.0–100.0)
MPV: 11 fL (ref 7.5–12.5)
Monocytes Relative: 18.7 %
Neutro Abs: 4290 cells/uL (ref 1500–7800)
Neutrophils Relative %: 57.2 %
Platelets: 227 10*3/uL (ref 140–400)
RBC: 5.03 10*6/uL (ref 4.20–5.80)
RDW: 15.6 % — ABNORMAL HIGH (ref 11.0–15.0)
Total Lymphocyte: 23.4 %
WBC: 7.5 10*3/uL (ref 3.8–10.8)

## 2020-08-23 LAB — POC COVID19 BINAXNOW: SARS Coronavirus 2 Ag: NEGATIVE

## 2020-08-23 LAB — GLUCOSE, POCT (MANUAL RESULT ENTRY): POC Glucose: 123 mg/dl — AB (ref 70–99)

## 2020-08-23 MED ORDER — LEVOFLOXACIN 500 MG PO TABS: 500.0000 mg | ORAL_TABLET | Freq: Every day | ORAL | 0 refills | Status: DC

## 2020-08-23 NOTE — Assessment & Plan Note (Signed)
Patient was advised to lose weight 

## 2020-08-23 NOTE — Assessment & Plan Note (Signed)
Patient was started on Levaquin

## 2020-08-23 NOTE — Progress Notes (Signed)
Established Patient Office Visit  Subjective:  Patient ID: John [name]., male    DOB: 07-24-1975  Age: 45 y.o. MRN: [id number]  CC:  Chief Complaint  Patient presents with   Sore Throat    Patient complains of sore throat with productive cough x 3 days    Vomiting    Sore Throat  This is a new problem. The current episode started in the past 7 days. The problem has been waxing and waning. Neither side of throat is experiencing more pain than the other. There has been no fever. The pain is at a severity of 0/10. Associated symptoms include congestion and coughing. Pertinent negatives include no abdominal pain, diarrhea, headaches, hoarse voice, swollen glands or trouble swallowing.   John [name]. presents for bronchitis 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 tophus 07/20/2019   Morbid obesity ([place])    Paranoid schizophrenia ([place])    Personality disorder ([place])    Sleep apnea    Thyroid disease     Past Surgical History:  Procedure Laterality Date   FRACTURE SURGERY      Family History  Problem Relation Age of Onset   Glaucoma Mother     Social History   Socioeconomic History   Marital status: Single    Spouse name: Not on file   Number of children: Not on file   Years of education: Not on file   Highest education level: Not on file  Occupational History   Not on file  Tobacco Use   Smoking status: Former   Smokeless tobacco: Never  Vaping Use   Vaping Use: Never used  Substance and Sexual Activity   Alcohol use: Not Currently   Drug use: No   Sexual activity: Not on file  Other Topics Concern   Not on file  Social History Narrative   Not on file   Social Determinants of Health   Financial Resource Strain: Not on file  Food Insecurity: Not on file  Transportation Needs: Not on file  Physical Activity: Not on file  Stress: Not on file  Social  Connections: Not on file  Intimate Partner Violence: Not on file     Current Outpatient Medications:    Accu-Chek Softclix Lancets lancets, Use as instructed, Disp: 100 each, Rfl: 12   albuterol (PROVENTIL) (2.5 MG/3ML) 0.083% nebulizer solution, USE 1 VIAL VIA NEBULIZER 3 TIMES A DAY, Disp: 300 mL, Rfl: 6   Alcohol Swabs (B-D SINGLE USE SWABS REGULAR) PADS, USE AS DIRECTED FOR BLOOD SUGAR CHECKS., Disp: 100 each, Rfl: 10   ALLERGY RELIEF 10 MG tablet, TAKE ONE TABLET BY MOUTH EVERY DAY, Disp: 30 tablet, Rfl: 10   asenapine (SAPHRIS) 5 MG SUBL 24 hr tablet, Place 1 tablet (5 mg total) 2 (two) times daily under the tongue., Disp: 60 tablet, Rfl: 0   atenolol (TENORMIN) 100 MG tablet, Take 1 tablet (100 mg total) by mouth daily., Disp: 30 tablet, Rfl: 3   atorvastatin (LIPITOR) 10 MG tablet, TAKE 1 TABLET BY MOUTH DAILY, Disp: 30 tablet, Rfl: 10   cloZAPine (CLOZARIL) 100 MG tablet, Take 100 mg by mouth daily., Disp: , Rfl:    divalproex (DEPAKOTE ER) 500 MG 24 hr tablet, Take 2,000 mg by mouth at bedtime. , Disp: , Rfl:    glucose blood (TRUE METRIX BLOOD GLUCOSE TEST) test strip, FINGERSTICK  BLOOD SUGAR ONCE DAILY, Disp: 100 each, Rfl: 10   haloperidol (HALDOL) 5 MG tablet, Take 5 mg by mouth 2 (two) times daily., Disp: , Rfl:    levofloxacin (LEVAQUIN) 500 MG tablet, Take 1 tablet (500 mg total) by mouth daily for 7 days., Disp: 7 tablet, Rfl: 0   levothyroxine (SYNTHROID) 75 MCG tablet, TAKE 1 TABLET BY MOUTH EVERY DAY ON AN EMPTY STOMACH, Disp: 30 tablet, Rfl: 10   meloxicam (MOBIC) 15 MG tablet, Take 1 tablet (15 mg total) by mouth daily., Disp: 30 tablet, Rfl: 0   metFORMIN (GLUCOPHAGE) 1000 MG tablet, TAKE 1 TABLET BY MOUTH TWICE A DAY WITH FOOD, Disp: 60 tablet, Rfl: 4   omeprazole (PRILOSEC) 20 MG capsule, TAKE 1 CAPSULE BY MOUTH DAILY, Disp: 30 capsule, Rfl: 6   oxybutynin (DITROPAN) 5 MG tablet, TAKE 1 TABLET BY MOUTH EVERY DAY, Disp: 30 tablet, Rfl: 10   Semaglutide,0.25 or  0.'5MG'$ /DOS, (OZEMPIC, 0.25 OR 0.5 MG/DOSE,) 2 MG/1.5ML SOPN, Inject 0.5 mg into the skin once a week., Disp: 1.5 mL, Rfl: 10   sitaGLIPtin (JANUVIA) 100 MG tablet, Take 1 tablet (100 mg total) by mouth daily., Disp: 30 tablet, Rfl: 3   No Known Allergies  ROS Review of Systems  Constitutional: Negative.   HENT:  Positive for congestion. Negative for hoarse voice and trouble swallowing.   Eyes: Negative.   Respiratory:  Positive for cough.   Cardiovascular: Negative.   Gastrointestinal: Negative.  Negative for abdominal pain and diarrhea.  Endocrine: Negative.   Genitourinary: Negative.   Musculoskeletal: Negative.   Skin: Negative.   Allergic/Immunologic: Negative.   Neurological: Negative.  Negative for headaches.  Hematological: Negative.   Psychiatric/Behavioral: Negative.    All other systems reviewed and are negative.    Objective:    Physical Exam Vitals reviewed.  Constitutional:      Appearance: Normal appearance.  HENT:     Mouth/Throat:     Mouth: Mucous membranes are moist.  Eyes:     Pupils: Pupils are equal, round, and reactive to light.  Neck:     Vascular: No carotid bruit.  Cardiovascular:     Rate and Rhythm: Normal rate and regular rhythm.     Pulses: Normal pulses.     Heart sounds: Normal heart sounds.  Pulmonary:     Effort: Pulmonary effort is normal.     Breath sounds: Normal breath sounds.  Abdominal:     General: Bowel sounds are normal.     Palpations: Abdomen is soft. There is no hepatomegaly, splenomegaly or mass.     Tenderness: There is no abdominal tenderness.     Hernia: No hernia is present.  Musculoskeletal:     Cervical back: Neck supple.     Right lower leg: No edema.     Left lower leg: No edema.  Skin:    Findings: No rash.  Neurological:     Mental Status: He is alert and oriented to person, place, and time.     Motor: No weakness.  Psychiatric:        Mood and Affect: Mood normal.        Behavior: Behavior normal.     BP 106/74   Pulse (!) 103   Temp (!) 100.6 F (38.1 C)   Ht '6\' 2"'$  (1.88 m)   Wt 282 lb 1.6 oz (128 kg)   BMI 36.22 kg/m  Wt Readings from Last 3 Encounters:  08/23/20 282 lb 1.6 oz (128 kg)  08/08/20 288 lb 12.8 oz (131 kg)  05/31/20 285 lb 1.6 oz (129.3 kg)     Health Maintenance Due  Topic Date Due   FOOT EXAM  Never done   OPHTHALMOLOGY EXAM  Never done   URINE MICROALBUMIN  Never done   Hepatitis C Screening  Never done   TETANUS/TDAP  06/24/2005   Pneumococcal Vaccine 22-29 Years old (2 - PCV) 04/28/2017   COVID-19 Vaccine (3 - Booster for Moderna series) 10/01/2019   INFLUENZA VACCINE  08/01/2020    There are no preventive care reminders to display for this patient.  Lab Results  Component Value Date   TSH 1.842 11/01/2016   Lab Results  Component Value Date   WBC 9.4 12/22/2019   HGB 12.6 (L) 12/22/2019   HCT 41.6 12/22/2019   MCV 87.2 12/22/2019   PLT 164 12/22/2019   Lab Results  Component Value Date   NA 138 12/22/2019   K 4.8 12/22/2019   CO2 28 12/22/2019   GLUCOSE 129 (H) 12/22/2019   BUN 15 12/22/2019   CREATININE 0.88 12/22/2019   BILITOT 0.9 12/21/2019   ALKPHOS 47 12/21/2019   AST 13 (L) 12/21/2019   ALT 9 12/21/2019   PROT 9.0 (H) 12/21/2019   ALBUMIN 4.0 12/21/2019   CALCIUM 9.1 12/22/2019   ANIONGAP 11 12/22/2019   Lab Results  Component Value Date   CHOL 146 11/24/2013   Lab Results  Component Value Date   HDL 22 (L) 11/24/2013   Lab Results  Component Value Date   LDLCALC 94 11/24/2013   Lab Results  Component Value Date   TRIG 152 11/24/2013   No results found for: CHOLHDL Lab Results  Component Value Date   HGBA1C 6.0 03/29/2020      Assessment & Plan:   Problem List Items Addressed This Visit       Respiratory   Bronchitis    Patient was started on Levaquin      Relevant Medications   levofloxacin (LEVAQUIN) 500 MG tablet   Other Relevant Orders   CBC with Differential/Platelet   Idiopathic  sleep related nonobstructive alveolar hypoventilation    Patient was advised to lose weight        Endocrine   Diabetes mellitus ([place])    Stable at the present time      Relevant Orders   POCT glucose (manual entry) (Completed)     Other   Morbid obesity ([place])    - I encouraged the patient to lose weight.  - I educated them on making healthy dietary choices including eating more fruits and vegetables and less fried foods. - I encouraged the patient to exercise more, and educated on the benefits of exercise including weight loss, diabetes prevention, and hypertension prevention.   Dietary counseling with a registered dietician  Referral to a weight management support group (e.g. Weight Watchers, Overeaters Anonymous)  If your BMI is greater than 29 or you have gained more than 15 pounds you should work on weight loss.  Attend a healthy cooking class       Somnolence    Chronic problem due to hypoventilation, patient does not smoke he was advised to lose weight      Sore throat - Primary    Sore throat due to bronchitis chest examination does not reveal any rales or rhonchi we will check CBC and blood sugar      Relevant Orders   POC COVID-19 (Completed)    Meds ordered this  encounter  Medications   levofloxacin (LEVAQUIN) 500 MG tablet    Sig: Take 1 tablet (500 mg total) by mouth daily for 7 days.    Dispense:  7 tablet    Refill:  0    Follow-up: No follow-ups on file.    Cletis [place], MD

## 2020-08-23 NOTE — Assessment & Plan Note (Signed)
Sore throat due to bronchitis chest examination does not reveal any rales or rhonchi we will check CBC and blood sugar

## 2020-08-23 NOTE — Assessment & Plan Note (Signed)
Stable at the present time. 

## 2020-08-23 NOTE — Assessment & Plan Note (Signed)

## 2020-08-23 NOTE — Assessment & Plan Note (Signed)
Chronic problem due to hypoventilation, patient does not smoke he was advised to lose weight

## 2020-08-29 ENCOUNTER — Emergency Department: Payer: 59

## 2020-08-29 ENCOUNTER — Other Ambulatory Visit: Payer: Self-pay

## 2020-08-29 ENCOUNTER — Inpatient Hospital Stay
Admission: EM | Admit: 2020-08-29 | Discharge: 2020-09-03 | DRG: 854 | Disposition: A | Discharge status: Home Health | Payer: 59 | Attending: Internal Medicine | Admitting: Internal Medicine

## 2020-08-29 DIAGNOSIS — F200 Paranoid schizophrenia: Secondary | ICD-10-CM | POA: Diagnosis present

## 2020-08-29 DIAGNOSIS — R29898 Other symptoms and signs involving the musculoskeletal system: Secondary | ICD-10-CM | POA: Diagnosis present

## 2020-08-29 DIAGNOSIS — R531 Weakness: Secondary | ICD-10-CM | POA: Diagnosis not present

## 2020-08-29 DIAGNOSIS — J40 Bronchitis, not specified as acute or chronic: Secondary | ICD-10-CM | POA: Diagnosis present

## 2020-08-29 DIAGNOSIS — R059 Cough, unspecified: Secondary | ICD-10-CM

## 2020-08-29 DIAGNOSIS — Z87891 Personal history of nicotine dependence: Secondary | ICD-10-CM

## 2020-08-29 DIAGNOSIS — X58XXXA Exposure to other specified factors, initial encounter: Secondary | ICD-10-CM | POA: Diagnosis present

## 2020-08-29 DIAGNOSIS — S83289A Other tear of lateral meniscus, current injury, unspecified knee, initial encounter: Secondary | ICD-10-CM | POA: Diagnosis present

## 2020-08-29 DIAGNOSIS — Z720 Tobacco use: Secondary | ICD-10-CM | POA: Diagnosis present

## 2020-08-29 DIAGNOSIS — E039 Hypothyroidism, unspecified: Secondary | ICD-10-CM | POA: Diagnosis present

## 2020-08-29 DIAGNOSIS — E785 Hyperlipidemia, unspecified: Secondary | ICD-10-CM | POA: Diagnosis present

## 2020-08-29 DIAGNOSIS — G4733 Obstructive sleep apnea (adult) (pediatric): Secondary | ICD-10-CM

## 2020-08-29 DIAGNOSIS — J4521 Mild intermittent asthma with (acute) exacerbation: Secondary | ICD-10-CM | POA: Diagnosis present

## 2020-08-29 DIAGNOSIS — Z6838 Body mass index (BMI) 38.0-38.9, adult: Secondary | ICD-10-CM

## 2020-08-29 DIAGNOSIS — E662 Morbid (severe) obesity with alveolar hypoventilation: Secondary | ICD-10-CM | POA: Diagnosis present

## 2020-08-29 DIAGNOSIS — M109 Gout, unspecified: Secondary | ICD-10-CM

## 2020-08-29 DIAGNOSIS — E119 Type 2 diabetes mellitus without complications: Secondary | ICD-10-CM | POA: Diagnosis present

## 2020-08-29 DIAGNOSIS — A419 Sepsis, unspecified organism: Secondary | ICD-10-CM | POA: Diagnosis not present

## 2020-08-29 DIAGNOSIS — M10062 Idiopathic gout, left knee: Secondary | ICD-10-CM | POA: Diagnosis present

## 2020-08-29 DIAGNOSIS — Z9989 Dependence on other enabling machines and devices: Secondary | ICD-10-CM

## 2020-08-29 DIAGNOSIS — M009 Pyogenic arthritis, unspecified: Secondary | ICD-10-CM | POA: Diagnosis present

## 2020-08-29 DIAGNOSIS — Z7984 Long term (current) use of oral hypoglycemic drugs: Secondary | ICD-10-CM

## 2020-08-29 DIAGNOSIS — J189 Pneumonia, unspecified organism: Secondary | ICD-10-CM | POA: Diagnosis present

## 2020-08-29 DIAGNOSIS — Z20822 Contact with and (suspected) exposure to covid-19: Secondary | ICD-10-CM | POA: Diagnosis present

## 2020-08-29 DIAGNOSIS — E872 Acidosis: Secondary | ICD-10-CM | POA: Diagnosis present

## 2020-08-29 DIAGNOSIS — M1710 Unilateral primary osteoarthritis, unspecified knee: Secondary | ICD-10-CM | POA: Diagnosis present

## 2020-08-29 DIAGNOSIS — I10 Essential (primary) hypertension: Secondary | ICD-10-CM | POA: Diagnosis present

## 2020-08-29 DIAGNOSIS — Z9119 Patient's noncompliance with other medical treatment and regimen: Secondary | ICD-10-CM

## 2020-08-29 DIAGNOSIS — Z791 Long term (current) use of non-steroidal anti-inflammatories (NSAID): Secondary | ICD-10-CM

## 2020-08-29 DIAGNOSIS — Z79890 Hormone replacement therapy (postmenopausal): Secondary | ICD-10-CM

## 2020-08-29 DIAGNOSIS — Z79899 Other long term (current) drug therapy: Secondary | ICD-10-CM

## 2020-08-29 DIAGNOSIS — F609 Personality disorder, unspecified: Secondary | ICD-10-CM | POA: Diagnosis present

## 2020-08-29 DIAGNOSIS — K219 Gastro-esophageal reflux disease without esophagitis: Secondary | ICD-10-CM | POA: Diagnosis present

## 2020-08-29 DIAGNOSIS — M10061 Idiopathic gout, right knee: Secondary | ICD-10-CM | POA: Diagnosis present

## 2020-08-29 LAB — BASIC METABOLIC PANEL
Anion gap: 9 (ref 5–15)
BUN: 10 mg/dL (ref 6–20)
CO2: 28 mmol/L (ref 22–32)
Calcium: 8.8 mg/dL — ABNORMAL LOW (ref 8.9–10.3)
Chloride: 97 mmol/L — ABNORMAL LOW (ref 98–111)
Creatinine, Ser: 0.85 mg/dL (ref 0.61–1.24)
GFR, Estimated: >60 mL/min (ref >60)
Glucose, Bld: 117 mg/dL — ABNORMAL HIGH (ref 70–99)
Potassium: 3.8 mmol/L (ref 3.5–5.1)
Sodium: 134 mmol/L — ABNORMAL LOW (ref 135–145)

## 2020-08-29 LAB — GLUCOSE, CAPILLARY: Glucose-Capillary: 148 mg/dL — ABNORMAL HIGH (ref 70–99)

## 2020-08-29 LAB — URINALYSIS, COMPLETE (UACMP) WITH MICROSCOPIC
Bacteria, UA: NONE SEEN
Bilirubin Urine: NEGATIVE
Glucose, UA: NEGATIVE mg/dL
Hgb urine dipstick: NEGATIVE
Ketones, ur: NEGATIVE mg/dL
Leukocytes,Ua: NEGATIVE
Nitrite: NEGATIVE
Protein, ur: NEGATIVE mg/dL
Specific Gravity, Urine: 1.004 — ABNORMAL LOW (ref 1.005–1.030)
pH: 6 (ref 5.0–8.0)

## 2020-08-29 LAB — CBC
HCT: 34.5 % — ABNORMAL LOW (ref 39.0–52.0)
Hemoglobin: 10.7 g/dL — ABNORMAL LOW (ref 13.0–17.0)
MCH: 26.6 pg (ref 26.0–34.0)
MCHC: 31 g/dL (ref 30.0–36.0)
MCV: 85.6 fL (ref 80.0–100.0)
Platelets: 214 10*3/uL (ref 150–400)
RBC: 4.03 MIL/uL — ABNORMAL LOW (ref 4.22–5.81)
RDW: 14.9 % (ref 11.5–15.5)
WBC: 10.2 10*3/uL (ref 4.0–10.5)
nRBC: 0 % (ref 0.0–0.2)

## 2020-08-29 LAB — RESP PANEL BY RT-PCR (FLU A&B, COVID) ARPGX2
Influenza A by PCR: NEGATIVE
Influenza B by PCR: NEGATIVE
SARS Coronavirus 2 by RT PCR: NEGATIVE

## 2020-08-29 LAB — SYNOVIAL CELL COUNT + DIFF, W/ CRYSTALS
Eosinophils-Synovial: 0 %
Lymphocytes-Synovial Fld: 3 %
Monocyte-Macrophage-Synovial Fluid: 5 %
Neutrophil, Synovial: 92 %
WBC, Synovial: 49390 /mm3 — ABNORMAL HIGH (ref 0–200)

## 2020-08-29 LAB — PROCALCITONIN: Procalcitonin: <0.1 ng/mL

## 2020-08-29 LAB — SEDIMENTATION RATE: Sed Rate: 121 mm/hr — ABNORMAL HIGH (ref 0–16)

## 2020-08-29 MED ORDER — ATORVASTATIN CALCIUM 10 MG PO TABS
10.0000 mg | ORAL_TABLET | Freq: Every day | ORAL | Status: DC
  Administered 2020-08-30 – 2020-09-02 (×4): 10 mg via ORAL
  Filled 2020-08-29 (×4): qty 1

## 2020-08-29 MED ORDER — ASENAPINE MALEATE 5 MG SL SUBL
10.0000 mg | SUBLINGUAL_TABLET | Freq: Every day | SUBLINGUAL | Status: DC
  Administered 2020-08-30 – 2020-09-02 (×5): 10 mg via SUBLINGUAL
  Filled 2020-08-29 (×8): qty 2

## 2020-08-29 MED ORDER — ONDANSETRON HCL 4 MG PO TABS
4.0000 mg | ORAL_TABLET | Freq: Four times a day (QID) | ORAL | Status: DC | PRN
  Administered 2020-08-30: 4 mg via ORAL
  Filled 2020-08-29: qty 1

## 2020-08-29 MED ORDER — LIDOCAINE HCL (PF) 1 % IJ SOLN
10.0000 mL | Freq: Once | INTRAMUSCULAR | Status: AC
  Administered 2020-08-29: 3 mL
  Filled 2020-08-29: qty 10

## 2020-08-29 MED ORDER — SODIUM CHLORIDE 0.9 % IV SOLN
500.0000 mg | Freq: Once | INTRAVENOUS | Status: AC
  Administered 2020-08-29: 500 mg via INTRAVENOUS
  Filled 2020-08-29: qty 500

## 2020-08-29 MED ORDER — IPRATROPIUM-ALBUTEROL 0.5-2.5 (3) MG/3ML IN SOLN
3.0000 mL | Freq: Once | RESPIRATORY_TRACT | Status: AC
  Administered 2020-08-29: 3 mL via RESPIRATORY_TRACT
  Filled 2020-08-29: qty 3

## 2020-08-29 MED ORDER — FENTANYL CITRATE PF 50 MCG/ML IJ SOSY
50.0000 ug | PREFILLED_SYRINGE | Freq: Once | INTRAMUSCULAR | Status: AC
  Administered 2020-08-29: 50 ug via INTRAVENOUS
  Filled 2020-08-29: qty 1

## 2020-08-29 MED ORDER — CLOZAPINE 25 MG PO TABS
150.0000 mg | ORAL_TABLET | Freq: Every day | ORAL | Status: DC
  Administered 2020-08-30 – 2020-09-02 (×5): 150 mg via ORAL
  Filled 2020-08-29 (×8): qty 2

## 2020-08-29 MED ORDER — ATENOLOL 25 MG PO TABS
100.0000 mg | ORAL_TABLET | Freq: Every day | ORAL | Status: DC
  Administered 2020-09-01 – 2020-09-03 (×3): 100 mg via ORAL
  Filled 2020-08-29 (×4): qty 4

## 2020-08-29 MED ORDER — SODIUM CHLORIDE 0.9 % IV SOLN
1.0000 g | Freq: Once | INTRAVENOUS | Status: AC
  Administered 2020-08-29: 1 g via INTRAVENOUS
  Filled 2020-08-29: qty 10

## 2020-08-29 MED ORDER — DIVALPROEX SODIUM ER 500 MG PO TB24
2000.0000 mg | ORAL_TABLET | Freq: Every day | ORAL | Status: DC
  Administered 2020-08-30 – 2020-09-02 (×5): 2000 mg via ORAL
  Filled 2020-08-29 (×7): qty 4

## 2020-08-29 MED ORDER — PANTOPRAZOLE SODIUM 40 MG PO TBEC
40.0000 mg | DELAYED_RELEASE_TABLET | Freq: Every day | ORAL | Status: DC
  Administered 2020-09-01 – 2020-09-03 (×3): 40 mg via ORAL
  Filled 2020-08-29 (×4): qty 1

## 2020-08-29 MED ORDER — ACETAMINOPHEN 325 MG PO TABS
650.0000 mg | ORAL_TABLET | Freq: Four times a day (QID) | ORAL | Status: AC | PRN
  Administered 2020-08-31 – 2020-09-01 (×2): 650 mg via ORAL
  Filled 2020-08-29 (×2): qty 2

## 2020-08-29 MED ORDER — ACETAMINOPHEN 500 MG PO TABS
1000.0000 mg | ORAL_TABLET | Freq: Once | ORAL | Status: AC
  Administered 2020-08-29: 1000 mg via ORAL
  Filled 2020-08-29: qty 2

## 2020-08-29 MED ORDER — ALBUTEROL SULFATE (2.5 MG/3ML) 0.083% IN NEBU: 2.5000 mg | INHALATION_SOLUTION | Freq: Four times a day (QID) | RESPIRATORY_TRACT | Status: AC | PRN

## 2020-08-29 MED ORDER — LACTATED RINGERS IV BOLUS
1000.0000 mL | Freq: Once | INTRAVENOUS | Status: AC
  Administered 2020-08-29: 1000 mL via INTRAVENOUS

## 2020-08-29 MED ORDER — METHYLPREDNISOLONE SODIUM SUCC 125 MG IJ SOLR
125.0000 mg | Freq: Once | INTRAMUSCULAR | Status: AC
  Administered 2020-08-29: 125 mg via INTRAVENOUS
  Filled 2020-08-29: qty 2

## 2020-08-29 MED ORDER — ONDANSETRON HCL 4 MG/2ML IJ SOLN: 4.0000 mg | Freq: Four times a day (QID) | INTRAMUSCULAR | Status: DC | PRN

## 2020-08-29 MED ORDER — OXYBUTYNIN CHLORIDE 5 MG PO TABS
5.0000 mg | ORAL_TABLET | Freq: Every day | ORAL | Status: DC
  Filled 2020-08-29 (×2): qty 1

## 2020-08-29 MED ORDER — ACETAMINOPHEN 650 MG RE SUPP
650.0000 mg | Freq: Four times a day (QID) | RECTAL | Status: AC | PRN
Start: 2020-08-29 — End: 2020-09-01

## 2020-08-29 MED ORDER — LEVOTHYROXINE SODIUM 50 MCG PO TABS
75.0000 ug | ORAL_TABLET | Freq: Every day | ORAL | Status: DC
  Administered 2020-08-30 – 2020-09-03 (×5): 75 ug via ORAL
  Filled 2020-08-29 (×5): qty 1

## 2020-08-29 NOTE — H&P (Signed)
History and Physical   John [name]. John [name]:[id number] DOB: 12/04/1975 DOA: 08/29/2020  PCP: Cletis Athens, MD  Outpatient Specialists: Dr. Humphrey Rolls, pulmonology Patient coming from: [facility]  I have personally briefly reviewed patient's old medical records in [name].  Chief Concern: Weakness  HPI: Peyten [name]. is a 45 y.o. male with medical history significant for schizophrenia, obesity, OSA, noncompliant with BiPAP, hypertension, hyperlipidemia, asthma, presents emergency department from group home with staff for chief concerns of weakness.  He reports that he has been feeling weak for about 2 weeks.  He states that he fell on his knees about 2 weeks ago.  He reports that the right knee was hurting first and then the left knee.  He mumbled a lot and it was difficult to understand how he fell on his knees.  At bedside he is able to tell me his name, his age, location of hospital, current calendar year 2022, and the president of the [place] is [employer]  He appeared sleepy and he was easily arousable with loud verbal stimuli and or sternal rubbing.  He endorses nausea and vomiting for the last 2 nights.  He denies any blood. He denies fever, chest pain, shortness of breath, dysuria, hematuria, diarrhea, loss of consciousness, syncope.  Social history: He lives at a rest home.  He denies tobacco, EtOH, recreational drug use.  Vaccination history: He states he has received 1 dose of [employer].  ROS: Constitutional: no weight change, no fever ENT/Mouth: no sore throat, no rhinorrhea Eyes: no eye pain, no vision changes Cardiovascular: no chest pain, no dyspnea,  no edema, no palpitations Respiratory: no cough, no sputum, no wheezing Gastrointestinal: no nausea, no vomiting, no diarrhea, no constipation Genitourinary: no urinary incontinence, no dysuria, no hematuria Musculoskeletal: no arthralgias, no myalgias Skin: no skin lesions, no pruritus, Neuro:  + weakness, no loss of consciousness, no syncope Psych: no anxiety, no depression, + decrease appetite Heme/Lymph: no bruising, no bleeding  ED Course: Discussed with emergency medicine provider, patient requiring hospitalization for chief concerns of septic joint.  Vitals in the emergency department was remarkable for temperature 99, respiration rate of 18, heart rate 110, blood pressure 119/85, SPO2 of 95% on room air.  Labs in the emergency department was remarkable for WBC 10.2, hemoglobin 10.7, platelets 214, sodium 134, potassium 3.8, chloride 97, bicarb 28, BUN 10, serum creatinine of 0.85, nonfasting blood glucose 117, GFR greater than 60.  COVID was negative.  Patient was given fentanyl 50 mcg, duo nebs 1 treatment, Solu-Medrol 125 mg IV, Tylenol 1000 mg, lidocaine, azithromycin, ceftriaxone IV, lactated Ringer 1000 mL bolus.  Assessment/Plan  Principal Problem:   Septic joint ([place]) Active Problems:   Morbid obesity ([place])   Essential hypertension   Hypothyroidism   Paranoid schizophrenia, chronic condition ([place])   Tobacco abuse   Knee arthropathy   Bilateral leg weakness   OSA on CPAP   OSA treated with BiPAP   Bronchitis   Community acquired pneumonia   # Weakness-secondary to septic joint Septic joint-secondary to trauma 2 weeks ago - Status post incision and drainage in the emergency department with synovial fluid resulting in WBC of 49,390. - ED provider consulted orthopedic and they state they have scheduled for bedside arthroscopy of the knee I&D on 08/30/2020  # Obstructive sleep apnea - Per pulmonology outpatient note on 08/08/2020: AHI score of 69, low SPO2 of 84 percent, requiring BiPAP - BiPAP nightly ordered with AHI  score noted in comment - Procalcitonin is pending  # Asthma with bronchitis-does not appear to be in acute exacerbation at this time and patient maintaining airway and not requiring oxygen supplementation - Albuterol nebulizer every 6 hours as  needed for wheezing and shortness of breath resumed  # Paranoid schizophrenia-chronic, resumed home asenapine sublingual 10 mg nightly, clozapine 150 mg p.o. nightly -Depakote 2000 mg p.o. nightly resumed  # Hypothyroid-levothyroxine 75 mcg every morning resumed # Hypertension-atenolol 100 mg daily resumed # Hyperlipidemia-atorvastatin 10 mg nightly resumed # GERD-PPI # Obesity-outpatient follow-up # DVT prophylaxis-I have not ordered pharmacologic DVT prophylaxis in anticipation of procedure with orthopedic service on 08/30/2020 - A.m. team to initiate pharmacologic DVT prophylaxis when appropriate  # On my evaluation of patient I have kept patient n.p.o. as patient was drifting in and out of sleep due to fentanyl dosing for the I&D - Aspiration precautions - Fall precautions  Chart reviewed.   DVT prophylaxis: TED hose Code Status: Full code Diet: N.p.o. Family Communication: No Disposition Plan: Pending clinical course Consults called: Orthopedic Admission status: MedSurg, telemetry, observation  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 tophus 07/20/2019   Morbid obesity ([place])    Paranoid schizophrenia ([place])    Personality disorder ([place])    Sleep apnea    Thyroid disease    Past Surgical History:  Procedure Laterality Date   FRACTURE SURGERY     Social History:  reports that he has quit smoking. He has never used smokeless tobacco. He reports that he does not currently use alcohol. He reports that he does not use drugs.  No Known Allergies Family History  Problem Relation Age of Onset   Glaucoma Mother    Family history: Family history reviewed and not pertinent  Prior to Admission medications   Medication Sig Start Date End Date Taking? Authorizing Provider  Accu-Chek Softclix Lancets lancets Use as instructed 09/24/19   Cletis Athens, MD  albuterol (PROVENTIL) (2.5 MG/3ML)  0.083% nebulizer solution USE 1 VIAL VIA NEBULIZER 3 TIMES A DAY 12/07/19   Cletis Athens, MD  Alcohol Swabs (B-D SINGLE USE SWABS REGULAR) PADS USE AS DIRECTED FOR BLOOD SUGAR CHECKS. 08/20/19   Cletis Athens, MD  ALLERGY RELIEF 10 MG tablet TAKE ONE TABLET BY MOUTH EVERY DAY 08/20/19   Cletis Athens, MD  asenapine (SAPHRIS) 5 MG SUBL 24 hr tablet Place 1 tablet (5 mg total) 2 (two) times daily under the tongue. 11/13/16   Bettey Costa, MD  atenolol (TENORMIN) 100 MG tablet Take 1 tablet (100 mg total) by mouth daily. 09/24/19   Cletis Athens, MD  atorvastatin (LIPITOR) 10 MG tablet TAKE 1 TABLET BY MOUTH DAILY 08/05/20   Cletis Athens, MD  cloZAPine (CLOZARIL) 100 MG tablet Take 100 mg by mouth daily.    [provider]  divalproex (DEPAKOTE ER) 500 MG 24 hr tablet Take 2,000 mg by mouth at bedtime.     [provider]  glucose blood (TRUE METRIX BLOOD GLUCOSE TEST) test strip FINGERSTICK BLOOD SUGAR ONCE DAILY 10/14/19   Cletis Athens, MD  haloperidol (HALDOL) 5 MG tablet Take 5 mg by mouth 2 (two) times daily.    [provider]  levofloxacin (LEVAQUIN) 500 MG tablet Take 1 tablet (500 mg total) by mouth daily for 7 days. 08/23/20 08/30/20  Cletis Athens, MD  levothyroxine (SYNTHROID) 75 MCG tablet TAKE 1  TABLET BY MOUTH EVERY DAY ON AN EMPTY STOMACH 08/05/20   Cletis Athens, MD  meloxicam (MOBIC) 15 MG tablet Take 1 tablet (15 mg total) by mouth daily. 12/29/19   Cuthriell, Charline Bills, PA-C  metFORMIN (GLUCOPHAGE) 1000 MG tablet TAKE 1 TABLET BY MOUTH TWICE A DAY WITH FOOD 08/01/20   Cletis Athens, MD  omeprazole (PRILOSEC) 20 MG capsule TAKE 1 CAPSULE BY MOUTH DAILY 08/01/20   Cletis Athens, MD  oxybutynin (DITROPAN) 5 MG tablet TAKE 1 TABLET BY MOUTH EVERY DAY 08/05/20   Cletis Athens, MD  Semaglutide,0.25 or 0.'5MG'$ /DOS, (OZEMPIC, 0.25 OR 0.5 MG/DOSE,) 2 MG/1.5ML SOPN Inject 0.5 mg into the skin once a week. 10/14/19   Cletis Athens, MD  sitaGLIPtin (JANUVIA) 100 MG tablet Take 1  tablet (100 mg total) by mouth daily. 09/24/19   Cletis Athens, MD   Physical Exam: Vitals:   08/29/20 1322 08/29/20 1500 08/29/20 1600 08/29/20 1700  BP: 119/85 119/84  127/80  Pulse: (!) 110 (!) 101 97 96  Resp: '18 20 16 14  '$ Temp: 99 F (37.2 C)     TempSrc: Oral     SpO2: 95% 95% 98% 99%  Weight:      Height:       Constitutional: appears age-appropriate, NAD, calm, comfortable Eyes: PERRL, lids and conjunctivae normal ENMT: Mucous membranes are moist. Posterior pharynx clear of any exudate or lesions. Age-appropriate dentition. Hearing appropriate Neck: normal, supple, no masses, no thyromegaly Respiratory: clear to auscultation bilaterally, no wheezing, no crackles. Normal respiratory effort. No accessory muscle use.  Cardiovascular: Regular rate and rhythm, no murmurs / rubs / gallops. No extremity edema. 2+ pedal pulses. No carotid bruits.  Abdomen: Obese abdomen, no tenderness, no masses palpated, no hepatosplenomegaly. Bowel sounds positive.  Musculoskeletal: no clubbing / cyanosis. No joint deformity upper and lower extremities. no contractures, no atrophy. Normal muscle tone.  Bilateral knee tenderness. Skin: no rashes, lesions, ulcers. No induration Neurologic: Sensation intact. Strength 5/5 in all 4.  Psychiatric: Normal judgment and insight. Alert and oriented x 3. Normal mood.  Flat affect  EKG: independently reviewed, showing sinus tachycardia with rate of 111, QTc 465  Chest x-ray on Admission: I personally reviewed and I agree with radiologist reading as below.  DG Chest Portable 1 View  Result Date: 08/29/2020 CLINICAL DATA:  Weakness, myalgias evaluate for infiltrate EXAM: PORTABLE CHEST 1 VIEW COMPARISON:  11/29/2019 FINDINGS: Cardiomegaly. Prominent pulmonary vasculature. Suspect heterogeneous airspace opacity of the right lung base. The visualized skeletal structures are unremarkable. IMPRESSION: 1. Suspect heterogeneous airspace opacity of the right lung base,  concerning for infection or aspiration. 2.  Cardiomegaly and pulmonary vascular prominence. Electronically Signed   By: Eddie Candle M.D.   On: 08/29/2020 15:32    Labs on Admission: I have personally reviewed following labs  CBC: Recent Labs  Lab 08/23/20 1458 08/29/20 1324  WBC 7.5 10.2  NEUTROABS 4,290  --   HGB 13.3 10.7*  HCT 41.9 34.5*  MCV 83.3 85.6  PLT 227 [id number]   Basic Metabolic Panel: Recent Labs  Lab 08/29/20 1324  NA 134*  K 3.8  CL 97*  CO2 28  GLUCOSE 117*  BUN 10  CREATININE 0.85  CALCIUM 8.8*   GFR: Estimated Creatinine Clearance: 159.3 mL/min (by C-G formula based on SCr of 0.85 mg/dL).  Urine analysis:    Component Value Date/Time   COLORURINE YELLOW (A) 11/29/2019 2351   APPEARANCEUR HAZY (A) 11/29/2019 2351   APPEARANCEUR Clear 04/09/2014 1215  LABSPEC 1.029 11/29/2019 2351   LABSPEC 1.015 04/09/2014 1215   PHURINE 5.0 11/29/2019 2351   GLUCOSEU NEGATIVE 11/29/2019 2351   GLUCOSEU Negative 04/09/2014 1215   HGBUR NEGATIVE 11/29/2019 2351   BILIRUBINUR NEGATIVE 11/29/2019 2351   BILIRUBINUR Negative 04/09/2014 1215   KETONESUR NEGATIVE 11/29/2019 2351   PROTEINUR 30 (A) 11/29/2019 2351   UROBILINOGEN 1.0 10/23/2011 1845   NITRITE NEGATIVE 11/29/2019 2351   LEUKOCYTESUR NEGATIVE 11/29/2019 2351   LEUKOCYTESUR Negative 04/09/2014 1215   Dr. Tobie Poet [facility]  If 7PM-7AM, please contact overnight-coverage provider If 7AM-7PM, please contact day coverage provider [email or web address]  08/29/2020, 5:41 PM

## 2020-08-29 NOTE — ED Triage Notes (Signed)
Pt [facility] from [facility] with staff member. Pt states that he has been feeling weak for a while. Pt also c/o bilateral knee pain but denies a fall. This writer had to assist pt to get out of the car and into the wheelchair, pt having trouble standing on his own.

## 2020-08-29 NOTE — ED Notes (Signed)
Received call from group home staff checking on patient's status ED Secretary Holley Raring) made them aware that he will be admitted. Staff member Joseph Art requested that they be called with update about where he is admitted to and the Diagnosis [phone] [facility] or [phone] her cell.

## 2020-08-29 NOTE — ED Provider Notes (Signed)
Also  [facility] Emergency Department Provider Note ____________________________________________   Event Date/Time   First MD Initiated Contact with Patient 08/29/20 1450     (approximate)  I have reviewed the triage vital signs and the nursing notes.  HISTORY  Chief Complaint Weakness   HPI John [name]. is a 45 y.o. malewho presents to the ED for evaluation of weakness and myalgias.   Chart review indicates obesity, DM, asthma and schizophrenia.   45 year old male presents to the ED from his local group home for evaluation of generalized weakness, bilateral knee pain, R>L, nonproductive cough and fatigue.  Reports feeling badly for couple days, at other times he tells me is been feeling badly for 2 weeks.  He is an inconsistent historian.  He also tells me at one point that he is here "due to my schizophrenia."  He denies any suicidal ideations or homicidality.  Denies fevers, falls or trauma, but reports pain to his bilateral knees specifically as well as diffuse myalgias. History somewhat limited due to his schizophrenia and poor historian  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 tophus 07/20/2019   Morbid obesity ([place])    Paranoid schizophrenia ([place])    Personality disorder ([place])    Sleep apnea    Thyroid disease     Patient Active Problem List   Diagnosis Date Noted   Septic joint ([place]) 08/29/2020   Community acquired pneumonia 08/29/2020   Bronchitis 08/23/2020   Idiopathic sleep related nonobstructive alveolar hypoventilation 08/23/2020   Sore throat 08/23/2020   OSA treated with BiPAP 08/08/2020   Obesity (BMI 30-39.9) 08/08/2020   OSA on CPAP 05/23/2020   CPAP use counseling 05/23/2020   BMI 36.0-36.9,adult 05/23/2020   Bilateral leg weakness 08/17/2019   Knee arthropathy 08/10/2019   Acute gout due to renal impairment involving right foot  07/08/2019   Encephalopathy acute    Somnolence 11/01/2016   AKI (acute kidney injury) ([place]) 11/01/2016   Sepsis ([place]) 04/27/2016   Respiratory failure ([place]) 12/20/2015   Myocardiopathy ([place]) 11/28/2015   Tobacco abuse 11/28/2015   Acute respiratory failure with hypercapnia ([place])    Sleep apnea    Essential hypertension 11/25/2015   Hypothyroidism 11/25/2015   Paranoid schizophrenia, chronic condition ([place]) 11/25/2015   Acute respiratory failure ([name])    Acute exacerbation of COPD with asthma ([place])    Diabetes mellitus ([place])    Diabetes mellitus due to underlying condition without complication, without long-term current use of insulin ([place])    Morbid obesity ([place]) 09/29/2008    Past Surgical History:  Procedure Laterality Date   FRACTURE SURGERY      Prior to Admission medications   Medication Sig Start Date End Date Taking? Authorizing Provider  Accu-Chek Softclix Lancets lancets Use as instructed 09/24/19   Cletis Athens, MD  albuterol (PROVENTIL) (2.5 MG/3ML) 0.083% nebulizer solution USE 1 VIAL VIA NEBULIZER 3 TIMES A DAY 12/07/19   Cletis Athens, MD  Alcohol Swabs (B-D SINGLE USE SWABS REGULAR) PADS USE AS DIRECTED FOR BLOOD SUGAR CHECKS. 08/20/19   Cletis Athens, MD  ALLERGY RELIEF 10 MG tablet TAKE ONE TABLET BY MOUTH EVERY DAY 08/20/19   Cletis Athens, MD  asenapine (SAPHRIS) 5 MG SUBL 24 hr tablet Place 1 tablet (5 mg total) 2 (two) times daily under the tongue. 11/13/16   Bettey Costa, MD  atenolol (TENORMIN) 100  MG tablet Take 1 tablet (100 mg total) by mouth daily. 09/24/19   Cletis Athens, MD  atorvastatin (LIPITOR) 10 MG tablet TAKE 1 TABLET BY MOUTH DAILY 08/05/20   Cletis Athens, MD  cloZAPine (CLOZARIL) 100 MG tablet Take 100 mg by mouth daily.    [provider]  divalproex (DEPAKOTE ER) 500 MG 24 hr tablet Take 2,000 mg by mouth at bedtime.     [provider]  glucose blood (TRUE METRIX BLOOD GLUCOSE TEST) test strip FINGERSTICK BLOOD SUGAR ONCE DAILY  10/14/19   Cletis Athens, MD  haloperidol (HALDOL) 5 MG tablet Take 5 mg by mouth 2 (two) times daily.    [provider]  levofloxacin (LEVAQUIN) 500 MG tablet Take 1 tablet (500 mg total) by mouth daily for 7 days. 08/23/20 08/30/20  Cletis Athens, MD  levothyroxine (SYNTHROID) 75 MCG tablet TAKE 1 TABLET BY MOUTH EVERY DAY ON AN EMPTY STOMACH 08/05/20   Cletis Athens, MD  meloxicam (MOBIC) 15 MG tablet Take 1 tablet (15 mg total) by mouth daily. 12/29/19   Cuthriell, Charline Bills, PA-C  metFORMIN (GLUCOPHAGE) 1000 MG tablet TAKE 1 TABLET BY MOUTH TWICE A DAY WITH FOOD 08/01/20   Cletis Athens, MD  omeprazole (PRILOSEC) 20 MG capsule TAKE 1 CAPSULE BY MOUTH DAILY 08/01/20   Cletis Athens, MD  oxybutynin (DITROPAN) 5 MG tablet TAKE 1 TABLET BY MOUTH EVERY DAY 08/05/20   Cletis Athens, MD  Semaglutide,0.25 or 0.5MG/DOS, (OZEMPIC, 0.25 OR 0.5 MG/DOSE,) 2 MG/1.5ML SOPN Inject 0.5 mg into the skin once a week. 10/14/19   Cletis Athens, MD  sitaGLIPtin (JANUVIA) 100 MG tablet Take 1 tablet (100 mg total) by mouth daily. 09/24/19   Cletis Athens, MD    Allergies Patient has no known allergies.  Family History  Problem Relation Age of Onset   Glaucoma Mother     Social History Social History   Tobacco Use   Smoking status: Former   Smokeless tobacco: Never  Scientific laboratory technician Use: Never used  Substance Use Topics   Alcohol use: Not Currently   Drug use: No    Review of Systems  Difficult to accurately assess due to patient being a poor historian and inconsistent with his history providing ____________________________________________   PHYSICAL EXAM:  VITAL SIGNS: Vitals:   08/29/20 1600 08/29/20 1700  BP:  127/80  Pulse: 97 96  Resp: 16 14  Temp:    SpO2: 98% 99%    Constitutional: Alert and oriented. Well appearing and in no acute distress. Eyes: Conjunctivae are normal. PERRL. EOMI. Head: Atraumatic. Nose: No congestion/rhinnorhea. Mouth/Throat: Mucous membranes are  dry.  Oropharynx non-erythematous. Neck: No stridor. No cervical spine tenderness to palpation. Cardiovascular: Tachycardic rate, regular rhythm. Grossly normal heart sounds.  Good peripheral circulation. Respiratory: Minimal tachypnea to the low 20s, no further evidence of distress.  Intermittent dry coughing fits.  Slight decreased air movement throughout with some scattered expiratory wheezes. Gastrointestinal: Soft , nondistended, nontender to palpation. No CVA tenderness. Musculoskeletal: No signs of trauma or skin changes to the bilateral lower extremities. Does have right greater than left knee effusions and some pain with passive ROM of these joints, again right greater than left.  No overlying skin changes or stigmata of cellulitis, fluctuance or bruising. Bilateral legs are distally neurovascularly intact. Neurologic:  Normal speech and language. No gross focal neurologic deficits are appreciated. Skin:  Skin is warm, dry and intact. No rash noted. Psychiatric: Mood and affect are flat.  Speech and behavior are somewhat scattered. ____________________________________________   LABS (all labs ordered are listed, but only abnormal results are displayed)  Labs Reviewed  BASIC METABOLIC PANEL - Abnormal; Notable for the following components:      Result Value   Sodium 134 (*)    Chloride 97 (*)    Glucose, Bld 117 (*)    Calcium 8.8 (*)    All other components within normal limits  CBC - Abnormal; Notable for the following components:   RBC 4.03 (*)    Hemoglobin 10.7 (*)    HCT 34.5 (*)    All other components within normal limits  SYNOVIAL CELL COUNT + DIFF, W/ CRYSTALS - Abnormal; Notable for the following components:   Color, Synovial YELLOW (*)    Appearance-Synovial CLOUDY (*)    WBC, Synovial 49,390 (*)    All other components within normal limits  RESP PANEL BY RT-PCR (FLU A&B, COVID) ARPGX2  BODY FLUID CULTURE W GRAM STAIN  URINALYSIS, COMPLETE (UACMP) WITH  MICROSCOPIC  GLUCOSE, BODY FLUID OTHER            PROTEIN, BODY FLUID (OTHER)  SEDIMENTATION RATE  BASIC METABOLIC PANEL  CBC  HIV ANTIBODY (ROUTINE TESTING W REFLEX)  PROCALCITONIN   ____________________________________________  12 Lead EKG  Sinus tachycardia with a rate of 111 bpm.  Normal axis and intervals.  No evidence of acute ischemia. ____________________________________________  RADIOLOGY  ED MD interpretation: 1 view CXR reviewed by me with right basilar patchy airspace opacity.  Official radiology report(s): DG Chest Portable 1 View  Result Date: 08/29/2020 CLINICAL DATA:  Weakness, myalgias evaluate for infiltrate EXAM: PORTABLE CHEST 1 VIEW COMPARISON:  11/29/2019 FINDINGS: Cardiomegaly. Prominent pulmonary vasculature. Suspect heterogeneous airspace opacity of the right lung base. The visualized skeletal structures are unremarkable. IMPRESSION: 1. Suspect heterogeneous airspace opacity of the right lung base, concerning for infection or aspiration. 2.  Cardiomegaly and pulmonary vascular prominence. Electronically Signed   By: Eddie Candle M.D.   On: 08/29/2020 15:32    ____________________________________________   PROCEDURES and INTERVENTIONS  Procedure(s) performed (including Critical Care):  .1-3 Lead EKG Interpretation  Date/Time: 08/29/2020 3:59 PM Performed by: Vladimir Crofts, MD Authorized by: Vladimir Crofts, MD     Interpretation: abnormal     ECG rate:  110   ECG rate assessment: tachycardic     Rhythm: sinus tachycardia     Ectopy: none     Conduction: normal   .Joint Aspiration/Arthrocentesis  Date/Time: 08/29/2020 3:59 PM Performed by: Vladimir Crofts, MD Authorized by: Vladimir Crofts, MD   Consent:    Consent obtained:  Verbal   Consent given by:  Patient   Risks, benefits, and alternatives were discussed: yes     Risks discussed:  Bleeding, infection and pain Location:    Location:  Knee   Knee:  R knee Anesthesia:    Anesthesia method:   Local infiltration   Local anesthetic:  Lidocaine 1% w/o epi Procedure details:    Preparation: Patient was prepped and draped in usual sterile fashion     Needle gauge:  18 G   Ultrasound guidance: no     Approach:  Lateral   Aspirate amount:  30cc   Aspirate characteristics:  Cloudy and yellow   Steroid injected: no     Specimen collected: yes   Post-procedure details:    Dressing:  Adhesive bandage   Procedure completion:  Tolerated well, no immediate complications  Medications  atenolol (TENORMIN) tablet 100 mg (has  no administration in time range)  atorvastatin (LIPITOR) tablet 10 mg (has no administration in time range)  levothyroxine (SYNTHROID) tablet 75 mcg (has no administration in time range)  pantoprazole (PROTONIX) EC tablet 40 mg (has no administration in time range)  divalproex (DEPAKOTE ER) 24 hr tablet 2,000 mg (has no administration in time range)  albuterol (PROVENTIL) (2.5 MG/3ML) 0.083% nebulizer solution 2.5 mg (has no administration in time range)  acetaminophen (TYLENOL) tablet 650 mg (has no administration in time range)    Or  acetaminophen (TYLENOL) suppository 650 mg (has no administration in time range)  ondansetron (ZOFRAN) tablet 4 mg (has no administration in time range)    Or  ondansetron (ZOFRAN) injection 4 mg (has no administration in time range)  acetaminophen (TYLENOL) tablet 1,000 mg (1,000 mg Oral Given 08/29/20 1513)  lactated ringers bolus 1,000 mL (0 mLs Intravenous Stopped 08/29/20 1636)  lidocaine (PF) (XYLOCAINE) 1 % injection 10 mL (3 mLs Infiltration Given 08/29/20 1550)  fentaNYL (SUBLIMAZE) injection 50 mcg (50 mcg Intravenous Given 08/29/20 1540)  ipratropium-albuterol (DUONEB) 0.5-2.5 (3) MG/3ML nebulizer solution 3 mL (3 mLs Nebulization Given 08/29/20 1704)  methylPREDNISolone sodium succinate (SOLU-MEDROL) 125 mg/2 mL injection 125 mg (125 mg Intravenous Given 08/29/20 1631)  cefTRIAXone (ROCEPHIN) 1 g in sodium chloride 0.9 % 100 mL  IVPB (0 g Intravenous Stopped 08/29/20 1701)  azithromycin (ZITHROMAX) 500 mg in sodium chloride 0.9 % 250 mL IVPB (500 mg Intravenous New Bag/Given 08/29/20 1704)    ____________________________________________   MDM / ED COURSE   45 year old male with diabetes and schizophrenia presents from his local group home with generalized weakness and knee pain, concerning for septic arthritis, also with stigmata of asthma exacerbation and CAP,  requiring medical admission.  Tachycardic but stable.  Exam with right-sided knee effusion with pain with passive ROM.  Further has stigmata of asthma exacerbation with wheezing and poor air movement.  CXR with right basilar infiltrate concerning for superimposed infectious pathology.  Provided steroids, breathing treatments and CAP coverage for his pulmonary status.  Regarding his right knee effusion and pain with ROM, arthrocentesis obtained and results are concerning for septic arthritis.  I discussed with orthopedics who plans for washout in the morning.  We will admit to medicine for further work-up and management.  Clinical Course as of 08/29/20 1830  Mon Aug 29, 2020  1551 Right-sided knee arthrocentesis performed and well-tolerated. [DS]  2229 Has been coughing fits and transient desaturations to the high 80s, resolving prior to nasal cannula administration.  Awaiting breathing treatments and steroids for asthma. [DS]  7989 I review arthrocentesis cell count and orthopedics is paged. [DS]  2119 I discuss with Dr.  Elder Negus  1712 I discuss with Dr. Rudene Christians, who agrees these results are concerning for septic arthritis.  He plans for washout in the morning and requests medical admission.  He requests the addition of ESR to lab work. [DS]    Clinical Course User Index [DS] Vladimir Crofts, MD    ____________________________________________   FINAL CLINICAL IMPRESSION(S) / ED DIAGNOSES  Final diagnoses:  Pyogenic arthritis of right knee joint, due to unspecified  organism [facility])  Generalized weakness  Mild intermittent asthma with exacerbation  Community acquired pneumonia of right lower lobe of lung     ED Discharge Orders     None        Nataniel Gasper Tamala Julian   Note:  This document was prepared using Dragon voice recognition software and may include unintentional dictation errors.  Vladimir Crofts, MD 08/29/20 [phone]

## 2020-08-30 ENCOUNTER — Observation Stay: Payer: 59 | Admitting: Anesthesiology

## 2020-08-30 ENCOUNTER — Ambulatory Visit: Payer: 59 | Admitting: Internal Medicine

## 2020-08-30 ENCOUNTER — Encounter: Payer: Self-pay | Admitting: Internal Medicine

## 2020-08-30 ENCOUNTER — Encounter
Admission: EM | Disposition: A | Discharge status: Home Health | Payer: Self-pay | Source: Home / Self Care | Attending: Internal Medicine

## 2020-08-30 ENCOUNTER — Other Ambulatory Visit: Payer: Self-pay

## 2020-08-30 DIAGNOSIS — M009 Pyogenic arthritis, unspecified: Secondary | ICD-10-CM

## 2020-08-30 DIAGNOSIS — F200 Paranoid schizophrenia: Secondary | ICD-10-CM | POA: Diagnosis present

## 2020-08-30 DIAGNOSIS — X58XXXA Exposure to other specified factors, initial encounter: Secondary | ICD-10-CM | POA: Diagnosis present

## 2020-08-30 DIAGNOSIS — Z9119 Patient's noncompliance with other medical treatment and regimen: Secondary | ICD-10-CM | POA: Diagnosis not present

## 2020-08-30 DIAGNOSIS — Z7984 Long term (current) use of oral hypoglycemic drugs: Secondary | ICD-10-CM | POA: Diagnosis not present

## 2020-08-30 DIAGNOSIS — F609 Personality disorder, unspecified: Secondary | ICD-10-CM | POA: Diagnosis present

## 2020-08-30 DIAGNOSIS — M109 Gout, unspecified: Secondary | ICD-10-CM | POA: Diagnosis not present

## 2020-08-30 DIAGNOSIS — Z87891 Personal history of nicotine dependence: Secondary | ICD-10-CM | POA: Diagnosis not present

## 2020-08-30 DIAGNOSIS — E662 Morbid (severe) obesity with alveolar hypoventilation: Secondary | ICD-10-CM | POA: Diagnosis present

## 2020-08-30 DIAGNOSIS — M10061 Idiopathic gout, right knee: Secondary | ICD-10-CM | POA: Diagnosis present

## 2020-08-30 DIAGNOSIS — Z20822 Contact with and (suspected) exposure to covid-19: Secondary | ICD-10-CM | POA: Diagnosis present

## 2020-08-30 DIAGNOSIS — K219 Gastro-esophageal reflux disease without esophagitis: Secondary | ICD-10-CM | POA: Diagnosis present

## 2020-08-30 DIAGNOSIS — A419 Sepsis, unspecified organism: Secondary | ICD-10-CM | POA: Diagnosis present

## 2020-08-30 DIAGNOSIS — R531 Weakness: Secondary | ICD-10-CM | POA: Diagnosis present

## 2020-08-30 DIAGNOSIS — M10062 Idiopathic gout, left knee: Secondary | ICD-10-CM | POA: Diagnosis present

## 2020-08-30 DIAGNOSIS — G4733 Obstructive sleep apnea (adult) (pediatric): Secondary | ICD-10-CM | POA: Diagnosis not present

## 2020-08-30 DIAGNOSIS — Z79899 Other long term (current) drug therapy: Secondary | ICD-10-CM | POA: Diagnosis not present

## 2020-08-30 DIAGNOSIS — Z79890 Hormone replacement therapy (postmenopausal): Secondary | ICD-10-CM | POA: Diagnosis not present

## 2020-08-30 DIAGNOSIS — E785 Hyperlipidemia, unspecified: Secondary | ICD-10-CM | POA: Diagnosis present

## 2020-08-30 DIAGNOSIS — Z6838 Body mass index (BMI) 38.0-38.9, adult: Secondary | ICD-10-CM | POA: Diagnosis not present

## 2020-08-30 DIAGNOSIS — I10 Essential (primary) hypertension: Secondary | ICD-10-CM

## 2020-08-30 DIAGNOSIS — M1710 Unilateral primary osteoarthritis, unspecified knee: Secondary | ICD-10-CM

## 2020-08-30 DIAGNOSIS — R29898 Other symptoms and signs involving the musculoskeletal system: Secondary | ICD-10-CM | POA: Diagnosis not present

## 2020-08-30 DIAGNOSIS — Z791 Long term (current) use of non-steroidal anti-inflammatories (NSAID): Secondary | ICD-10-CM | POA: Diagnosis not present

## 2020-08-30 DIAGNOSIS — E039 Hypothyroidism, unspecified: Secondary | ICD-10-CM | POA: Diagnosis present

## 2020-08-30 DIAGNOSIS — E872 Acidosis: Secondary | ICD-10-CM | POA: Diagnosis present

## 2020-08-30 DIAGNOSIS — E119 Type 2 diabetes mellitus without complications: Secondary | ICD-10-CM | POA: Diagnosis present

## 2020-08-30 DIAGNOSIS — S83289A Other tear of lateral meniscus, current injury, unspecified knee, initial encounter: Secondary | ICD-10-CM | POA: Diagnosis present

## 2020-08-30 DIAGNOSIS — J40 Bronchitis, not specified as acute or chronic: Secondary | ICD-10-CM | POA: Diagnosis present

## 2020-08-30 DIAGNOSIS — J4521 Mild intermittent asthma with (acute) exacerbation: Secondary | ICD-10-CM | POA: Diagnosis present

## 2020-08-30 DIAGNOSIS — G4730 Sleep apnea, unspecified: Secondary | ICD-10-CM | POA: Diagnosis not present

## 2020-08-30 HISTORY — PX: KNEE ARTHROSCOPY: SHX127

## 2020-08-30 LAB — BASIC METABOLIC PANEL
Anion gap: 7 (ref 5–15)
BUN: 10 mg/dL (ref 6–20)
CO2: 32 mmol/L (ref 22–32)
Calcium: 8.7 mg/dL — ABNORMAL LOW (ref 8.9–10.3)
Chloride: 99 mmol/L (ref 98–111)
Creatinine, Ser: 0.76 mg/dL (ref 0.61–1.24)
GFR, Estimated: >60 mL/min (ref >60)
Glucose, Bld: 157 mg/dL — ABNORMAL HIGH (ref 70–99)
Potassium: 4.4 mmol/L (ref 3.5–5.1)
Sodium: 138 mmol/L (ref 135–145)

## 2020-08-30 LAB — CBC WITH DIFFERENTIAL/PLATELET
Abs Immature Granulocytes: 0.12 10*3/uL — ABNORMAL HIGH (ref 0.00–0.07)
Basophils Absolute: 0 10*3/uL (ref 0.0–0.1)
Basophils Relative: 0 %
Eosinophils Absolute: 0 10*3/uL (ref 0.0–0.5)
Eosinophils Relative: 0 %
HCT: 36.5 % — ABNORMAL LOW (ref 39.0–52.0)
Hemoglobin: 11.1 g/dL — ABNORMAL LOW (ref 13.0–17.0)
Immature Granulocytes: 1 %
Lymphocytes Relative: 12 %
Lymphs Abs: 1.2 10*3/uL (ref 0.7–4.0)
MCH: 25.9 pg — ABNORMAL LOW (ref 26.0–34.0)
MCHC: 30.4 g/dL (ref 30.0–36.0)
MCV: 85.1 fL (ref 80.0–100.0)
Monocytes Absolute: 0.4 10*3/uL (ref 0.1–1.0)
Monocytes Relative: 4 %
Neutro Abs: 8 10*3/uL — ABNORMAL HIGH (ref 1.7–7.7)
Neutrophils Relative %: 83 %
Platelets: 241 10*3/uL (ref 150–400)
RBC: 4.29 MIL/uL (ref 4.22–5.81)
RDW: 14.8 % (ref 11.5–15.5)
WBC: 9.7 10*3/uL (ref 4.0–10.5)
nRBC: 0 % (ref 0.0–0.2)

## 2020-08-30 LAB — GLUCOSE, BODY FLUID OTHER: Glucose, Body Fluid Other: 62 mg/dL

## 2020-08-30 LAB — PROTEIN, BODY FLUID (OTHER): Total Protein, Body Fluid Other: 4.3 g/dL

## 2020-08-30 LAB — GLUCOSE, CAPILLARY
Glucose-Capillary: 132 mg/dL — ABNORMAL HIGH (ref 70–99)
Glucose-Capillary: 136 mg/dL — ABNORMAL HIGH (ref 70–99)

## 2020-08-30 LAB — HIV ANTIBODY (ROUTINE TESTING W REFLEX): HIV Screen 4th Generation wRfx: NONREACTIVE

## 2020-08-30 SURGERY — ARTHROSCOPY, KNEE
Anesthesia: General | Site: Knee | Laterality: Right

## 2020-08-30 MED ORDER — FENTANYL CITRATE (PF) 100 MCG/2ML IJ SOLN: 25.0000 ug | INTRAMUSCULAR | Status: DC | PRN

## 2020-08-30 MED ORDER — OXYCODONE HCL 5 MG PO TABS: 5.0000 mg | ORAL_TABLET | Freq: Once | ORAL | Status: DC | PRN

## 2020-08-30 MED ORDER — ONDANSETRON HCL 4 MG/2ML IJ SOLN: 4.0000 mg | Freq: Four times a day (QID) | INTRAMUSCULAR | Status: DC | PRN

## 2020-08-30 MED ORDER — LACTATED RINGERS IR SOLN
Status: DC | PRN
  Administered 2020-08-30 (×2): 3000 mL

## 2020-08-30 MED ORDER — DEXMEDETOMIDINE (PRECEDEX) IN NS 20 MCG/5ML (4 MCG/ML) IV SYRINGE
PREFILLED_SYRINGE | INTRAVENOUS | Status: DC | PRN
  Administered 2020-08-30 (×2): 8 ug via INTRAVENOUS

## 2020-08-30 MED ORDER — PROPOFOL 10 MG/ML IV BOLUS
INTRAVENOUS | Status: DC | PRN
  Administered 2020-08-30: 200 mg via INTRAVENOUS

## 2020-08-30 MED ORDER — DOCUSATE SODIUM 100 MG PO CAPS
100.0000 mg | ORAL_CAPSULE | Freq: Two times a day (BID) | ORAL | Status: DC
  Administered 2020-08-30 – 2020-09-03 (×7): 100 mg via ORAL
  Filled 2020-08-30 (×8): qty 1

## 2020-08-30 MED ORDER — METOCLOPRAMIDE HCL 5 MG/ML IJ SOLN: 5.0000 mg | Freq: Three times a day (TID) | INTRAMUSCULAR | Status: DC | PRN

## 2020-08-30 MED ORDER — DEXMEDETOMIDINE (PRECEDEX) IN NS 20 MCG/5ML (4 MCG/ML) IV SYRINGE
PREFILLED_SYRINGE | INTRAVENOUS | Status: AC
  Filled 2020-08-30: qty 5

## 2020-08-30 MED ORDER — MIDAZOLAM HCL 2 MG/2ML IJ SOLN
INTRAMUSCULAR | Status: AC
  Filled 2020-08-30: qty 2

## 2020-08-30 MED ORDER — SODIUM CHLORIDE 0.9 % IV SOLN: INTRAVENOUS | Status: DC | PRN

## 2020-08-30 MED ORDER — MEPERIDINE HCL 25 MG/ML IJ SOLN: 6.2500 mg | INTRAMUSCULAR | Status: DC | PRN

## 2020-08-30 MED ORDER — PROMETHAZINE HCL 25 MG/ML IJ SOLN: 6.2500 mg | INTRAMUSCULAR | Status: DC | PRN

## 2020-08-30 MED ORDER — ONDANSETRON HCL 4 MG/2ML IJ SOLN
INTRAMUSCULAR | Status: DC | PRN
  Administered 2020-08-30: 4 mg via INTRAVENOUS

## 2020-08-30 MED ORDER — DEXAMETHASONE SODIUM PHOSPHATE 10 MG/ML IJ SOLN
INTRAMUSCULAR | Status: DC | PRN
  Administered 2020-08-30: 8 mg via INTRAVENOUS

## 2020-08-30 MED ORDER — POLYETHYLENE GLYCOL 3350 17 G PO PACK
17.0000 g | PACK | Freq: Every day | ORAL | Status: DC | PRN
  Administered 2020-09-01: 17 g via ORAL
  Filled 2020-08-30: qty 1

## 2020-08-30 MED ORDER — METOCLOPRAMIDE HCL 10 MG PO TABS: 5.0000 mg | ORAL_TABLET | Freq: Three times a day (TID) | ORAL | Status: DC | PRN

## 2020-08-30 MED ORDER — FENTANYL CITRATE (PF) 100 MCG/2ML IJ SOLN
INTRAMUSCULAR | Status: AC
  Filled 2020-08-30: qty 2

## 2020-08-30 MED ORDER — FENTANYL CITRATE (PF) 100 MCG/2ML IJ SOLN
INTRAMUSCULAR | Status: DC | PRN
  Administered 2020-08-30 (×4): 25 ug via INTRAVENOUS

## 2020-08-30 MED ORDER — OXYCODONE HCL 5 MG/5ML PO SOLN
5.0000 mg | Freq: Once | ORAL | Status: DC | PRN
Start: 2020-08-30 — End: 2020-08-30

## 2020-08-30 MED ORDER — ENOXAPARIN SODIUM 40 MG/0.4ML IJ SOSY
40.0000 mg | PREFILLED_SYRINGE | INTRAMUSCULAR | Status: DC
  Administered 2020-08-31 – 2020-09-03 (×4): 40 mg via SUBCUTANEOUS
  Filled 2020-08-30 (×4): qty 0.4

## 2020-08-30 MED ORDER — ONDANSETRON HCL 4 MG PO TABS: 4.0000 mg | ORAL_TABLET | Freq: Four times a day (QID) | ORAL | Status: DC | PRN

## 2020-08-30 MED ORDER — LIDOCAINE HCL (CARDIAC) PF 100 MG/5ML IV SOSY
PREFILLED_SYRINGE | INTRAVENOUS | Status: DC | PRN
  Administered 2020-08-30: 100 mg via INTRAVENOUS

## 2020-08-30 SURGICAL SUPPLY — 32 items
APL PRP STRL LF DISP 70% ISPRP (MISCELLANEOUS) ×1
BLADE INCISOR PLUS 4.5 (BLADE) IMPLANT
BNDG ELASTIC 4X5.8 VLCR STR LF (GAUZE/BANDAGES/DRESSINGS) ×1 IMPLANT
CHLORAPREP W/TINT 26 (MISCELLANEOUS) ×2 IMPLANT
CUFF TOURN SGL QUICK 24 (TOURNIQUET CUFF)
CUFF TOURN SGL QUICK 34 (TOURNIQUET CUFF)
CUFF TRNQT CYL 24X4X16.5-23 (TOURNIQUET CUFF) IMPLANT
CUFF TRNQT CYL 34X4.125X (TOURNIQUET CUFF) IMPLANT
DRAPE C-ARMOR (DRAPES) IMPLANT
GAUZE SPONGE 4X4 12PLY STRL (GAUZE/BANDAGES/DRESSINGS) ×2 IMPLANT
GLOVE SURG SYN 9.0  PF PI (GLOVE) ×2
GLOVE SURG SYN 9.0 PF PI (GLOVE) ×1 IMPLANT
GOWN SRG 2XL LVL 4 RGLN SLV (GOWNS) ×1 IMPLANT
GOWN STRL NON-REIN 2XL LVL4 (GOWNS) ×2
GOWN STRL REUS W/ TWL LRG LVL3 (GOWN DISPOSABLE) ×2 IMPLANT
GOWN STRL REUS W/TWL LRG LVL3 (GOWN DISPOSABLE) ×2
HEMOVAC 400CC 10FR (MISCELLANEOUS) ×1 IMPLANT
IV LACTATED RINGER IRRG 3000ML (IV SOLUTION) ×8
IV LR IRRIG 3000ML ARTHROMATIC (IV SOLUTION) ×2 IMPLANT
KIT TURNOVER KIT A (KITS) ×2 IMPLANT
MANIFOLD NEPTUNE II (INSTRUMENTS) ×3 IMPLANT
NEEDLE HYPO 22GX1.5 SAFETY (NEEDLE) ×2 IMPLANT
PACK ARTHROSCOPY KNEE (MISCELLANEOUS) ×2 IMPLANT
SCALPEL PROTECTED #11 DISP (BLADE) ×2 IMPLANT
SPONGE T-LAP 18X18 ~~LOC~~+RFID (SPONGE) ×2 IMPLANT
SUT ETHILON 4-0 (SUTURE) ×2
SUT ETHILON 4-0 FS2 18XMFL BLK (SUTURE) ×1
SUTURE ETHLN 4-0 FS2 18XMF BLK (SUTURE) ×1 IMPLANT
TUBING INFLOW SET DBFLO PUMP (TUBING) ×2 IMPLANT
TUBING OUTFLOW SET DBLFO PUMP (TUBING) ×2 IMPLANT
WAND COBLATION FLOW 50 (SURGICAL WAND) IMPLANT
WATER STERILE IRR 500ML POUR (IV SOLUTION) ×1 IMPLANT

## 2020-08-30 NOTE — Transfer of Care (Addendum)
Immediate Anesthesia Transfer of Care Note  Patient: [name].  Procedure(s) Performed: ARTHROSCOPY KNEE- I&D, PARTIAL LATERAL MENISCECTOMY (Right: Knee)  Patient Location: PACU  Anesthesia Type:General  Level of Consciousness: sedated  Airway & Oxygen Therapy: Patient Spontanous Breathing and Patient connected to face mask oxygen  Post-op Assessment: Report given to RN and Post -op Vital signs reviewed and stable  Post vital signs: Reviewed and stable  Last Vitals:  Vitals Value Taken Time  BP 119/90 08/30/20 1230  Temp    Pulse 86 08/30/20 1236  Resp 15 08/30/20 1236  SpO2 100 % 08/30/20 1236  Vitals shown include unvalidated device data.  Last Pain:  Vitals:   08/30/20 1050  TempSrc: Temporal  PainSc: 1          Complications: No notable events documented.

## 2020-08-30 NOTE — Plan of Care (Signed)
Pt admitted to unit this shift. Pt very drowsy, bed low and locked. Call bell within reach. No needs expressed at this time. Problem: Coping: Goal: Level of anxiety will decrease Outcome: Progressing   Problem: Elimination: Goal: Will not experience complications related to urinary retention Outcome: Progressing   Problem: Pain Managment: Goal: General experience of comfort will improve Outcome: Progressing   Problem: Safety: Goal: Ability to remain free from injury will improve Outcome: Progressing   Problem: Skin Integrity: Goal: Risk for impaired skin integrity will decrease Outcome: Progressing

## 2020-08-30 NOTE — Anesthesia Postprocedure Evaluation (Signed)
Anesthesia Post Note  Patient: John [name].  Procedure(s) Performed: ARTHROSCOPY KNEE- I&D, PARTIAL LATERAL MENISCECTOMY (Right: Knee)  Patient location during evaluation: PACU Anesthesia Type: General Level of consciousness: awake and alert Pain management: pain level controlled Vital Signs Assessment: post-procedure vital signs reviewed and stable Respiratory status: spontaneous breathing, nonlabored ventilation and respiratory function stable Cardiovascular status: blood pressure returned to baseline and stable Postop Assessment: no signs of nausea or vomiting Anesthetic complications: no   No notable events documented.   Last Vitals:  Vitals:   08/30/20 1415 08/30/20 1430  BP: 132/88 (!) 134/91  Pulse: 81 77  Resp: 11 19  Temp:  36.8 C  SpO2: 96% 98%    Last Pain:  Vitals:   08/30/20 1430  TempSrc:   PainSc: 0-No pain                 Shristi Scheib

## 2020-08-30 NOTE — Op Note (Signed)
08/30/2020  12:28 PM  PATIENT:  John [name].  45 y.o. male  PRE-OPERATIVE DIAGNOSIS:  septic right knee  POST-OPERATIVE DIAGNOSIS:  septic right knee, lateral meniscus tear  PROCEDURE:  Procedure(s): ARTHROSCOPY KNEE- I&D, PARTIAL LATERAL MENISCECTOMY (Right)  SURGEON: Laurene Footman, MD  ASSISTANTS: None  ANESTHESIA:   general  EBL:  Total I/O In: 600 [I.V.:600] Out: 5 [Blood:5]  BLOOD ADMINISTERED:none  DRAINS: (1) Hemovact drain(s) in the right knee with  Suction Open   LOCAL MEDICATIONS USED:  NONE  SPECIMEN:  No Specimen  DISPOSITION OF SPECIMEN:  N/A  COUNTS:  YES  TOURNIQUET:   Total Tourniquet Time Documented: Thigh (Right) - 34 minutes Total: Thigh (Right) - 34 minutes   IMPLANTS: None  DICTATION: .Dragon Dictation patient was brought to the operating room and after adequate general anesthesia was obtained the right leg was prepped and draped in usual sterile fashion with a tourniquet and leg holder applied.  After patient identification and timeout procedure were completed inferior lateral portal was made the arthroscope was introduced and cloudy fluid came out.  The arthroscope was introduced and on inspection there was extensive chondrocalcinosis present and synovitis.  An inferior medial portal was made and thorough irrigation of the knee was carried out with a shaver removing synovitis from the anterior compartment as well as the suprapatellar pouch.  After thorough irrigation further probing was carried out the medial meniscus was intact with degenerative changes presents ACL intact lateral compartment had a discoid meniscus with a tear in the midportion of the middle third this was ablated with use of an ArthroCare wand this was incidental finding unrelated to septic knee but a significant tear being addressed intraoperatively.  Following this the knee was irrigated with a total of 12,000 cc till the joint was clear an inch superior lateral portal was  made and the drain was inserted.  The wounds were then dressed with Xeroform 4 x 4's web roll and Ace wrap.  Tourniquet let down prior to dressing application.  PLAN OF CARE: Admit to inpatient   PATIENT DISPOSITION:  PACU - hemodynamically stable.

## 2020-08-30 NOTE — Progress Notes (Addendum)
PROGRESS NOTE    John [name].  ZY:[id number] DOB: [birth year] DOA: 08/29/2020 PCP: Cletis Athens, MD   Brief Narrative:  John Nulph. is a 45 y.o. male with medical history significant for schizophrenia, obesity, OSA, noncompliant with BiPAP, hypertension, hyperlipidemia, asthma, presents emergency department from group home with staff for chief concerns of weakness. Found to have septic right knee after trauma(fall). Ortho consulted for further evaluation and treatment   Assessment & Plan:  Suspected sepsis secondary to right knee infection, POA Cannot rule out aseptic/traumatic effusion - Plan is for arthroscopic irrigation/debridement 08/30/2020 for further information -Tachycardic and tachypneic at intake, questionable infection -otherwise without leukocytosis, fever, procalcitonin negative - Defer to orthopedic surgery for antibiotic administration, op-note indicates awaiting cultures for speciation and sensitivities   Obstructive sleep apnea - Per pulmonology outpatient note on 08/08/2020: AHI score of 69, low SPO2 of 84 percent, requiring BiPAP - BiPAP nightly ordered with AHI score noted in comment - Procalcitonin is pending   # Asthma with bronchitis-without acute exacerbation, continue home meds# Paranoid schizophrenia-controlled, continue asenapine, clozapine, depakote  # Hypothyroid-levothyroxine 75 mcg every morning resumed # Hypertension-atenolol 100 mg daily resumed # Hyperlipidemia-atorvastatin 10 mg nightly resumed # GERD-PPI # Obesity-outpatient follow-up  DVT prophylaxis: Lovenox Code Status: Full Family Communication: None present  Status is: Inpatient  Dispo: The patient is from: [place]              Anticipated d/c is to: To be determined              Anticipated d/c date is: Pending surgical clearance as above              Patient currently not medically stable for discharge  Consultants:  Orthopedic surgery  Procedures:  Right knee  aspiration, tentative washout later today 08/30/2020  Antimicrobials:  Per orthopedic surgery, azithromycin, ceftriaxone x1 in the ED  Subjective: No acute issues or events overnight denies nausea vomiting diarrhea constipation headache fevers or chills  Objective: Vitals:   08/29/20 2118 08/29/20 2200 08/30/20 0543 08/30/20 0754  BP: 120/68 126/81 129/63 120/76  Pulse: 84 85 75 87  Resp: '18 18 19 14  '$ Temp: 98.4 F (36.9 C) 98.2 F (36.8 C) 98.7 F (37.1 C) 97.8 F (36.6 C)  TempSrc: Oral Oral    SpO2: 97% 92% 96% 91%  Weight:      Height:        Intake/Output Summary (Last 24 hours) at 08/30/2020 0756 Last data filed at 08/30/2020 0500 Gross per 24 hour  Intake 100 ml  Output 700 ml  Net -600 ml   Filed Weights   08/29/20 1321  Weight: 131.5 kg    Examination:  General:  Pleasantly resting in bed, No acute distress. HEENT:  Normocephalic atraumatic.  Sclerae nonicteric, noninjected.  Extraocular movements intact bilaterally. Neck:  Without mass or deformity.  Trachea is midline. Lungs:  Clear to auscultate bilaterally without rhonchi, wheeze, or rales. Heart:  Regular rate and rhythm.  Without murmurs, rubs, or gallops. Abdomen:  Soft, nontender, obese, nondistended.  Without guarding or rebound. Extremities: Without cyanosis, clubbing, edema, right knee limited range of motion Vascular:  Dorsalis pedis and posterior tibial pulses palpable bilaterally. Skin:  Warm and dry, no erythema, no ulcerations.  Data Reviewed: I have personally reviewed following labs and imaging studies  CBC: Recent Labs  Lab 08/23/20 1458 08/29/20 1324 08/30/20 0538  WBC 7.5 10.2 9.7  NEUTROABS 4,290  --  8.0*  HGB  13.3 10.7* 11.1*  HCT 41.9 34.5* 36.5*  MCV 83.3 85.6 85.1  PLT 227 214 [id number]   Basic Metabolic Panel: Recent Labs  Lab 08/29/20 1324 08/30/20 0538  NA 134* 138  K 3.8 4.4  CL 97* 99  CO2 28 32  GLUCOSE 117* 157*  BUN 10 10  CREATININE 0.85 0.76  CALCIUM 8.8*  8.7*   GFR: Estimated Creatinine Clearance: 169.2 mL/min (by C-G formula based on SCr of 0.76 mg/dL). Liver Function Tests: No results for input(s): AST, ALT, ALKPHOS, BILITOT, PROT, ALBUMIN in the last 168 hours. No results for input(s): LIPASE, AMYLASE in the last 168 hours. No results for input(s): AMMONIA in the last 168 hours. Coagulation Profile: No results for input(s): INR, PROTIME in the last 168 hours. Cardiac Enzymes: No results for input(s): CKTOTAL, CKMB, CKMBINDEX, TROPONINI in the last 168 hours. BNP (last 3 results) No results for input(s): PROBNP in the last 8760 hours. HbA1C: No results for input(s): HGBA1C in the last 72 hours. CBG: Recent Labs  Lab 08/29/20 2208  GLUCAP 148*   Lipid Profile: No results for input(s): CHOL, HDL, LDLCALC, TRIG, CHOLHDL, LDLDIRECT in the last 72 hours. Thyroid Function Tests: No results for input(s): TSH, T4TOTAL, FREET4, T3FREE, THYROIDAB in the last 72 hours. Anemia Panel: No results for input(s): VITAMINB12, FOLATE, FERRITIN, TIBC, IRON, RETICCTPCT in the last 72 hours. Sepsis Labs: Recent Labs  Lab 08/29/20 1324  PROCALCITON <0.10    Recent Results (from the past 240 hour(s))  Resp Panel by RT-PCR (Flu A&B, Covid) Nasopharyngeal Swab     Status: None   Collection Time: 08/29/20  3:19 PM   Specimen: Nasopharyngeal Swab; Nasopharyngeal(NP) swabs in vial transport medium  Result Value Ref Range Status   SARS Coronavirus 2 by RT PCR NEGATIVE NEGATIVE Final    Comment: (NOTE) SARS-CoV-2 target nucleic acids are NOT DETECTED.  The SARS-CoV-2 RNA is generally detectable in upper respiratory specimens during the acute phase of infection. The lowest concentration of SARS-CoV-2 viral copies this assay can detect is 138 copies/mL. A negative result does not preclude SARS-Cov-2 infection and should not be used as the sole basis for treatment or other patient management decisions. A negative result may occur with  improper  specimen collection/handling, submission of specimen other than nasopharyngeal swab, presence of viral mutation(s) within the areas targeted by this assay, and inadequate number of viral copies(<138 copies/mL). A negative result must be combined with clinical observations, patient history, and epidemiological information. The expected result is Negative.  Fact Sheet for Patients:  EntrepreneurPulse.com.au  Fact Sheet for Healthcare Providers:  IncredibleEmployment.be  This test is no t yet approved or cleared by the [place] FDA and  has been authorized for detection and/or diagnosis of SARS-CoV-2 by FDA under an Emergency Use Authorization (EUA). This EUA will remain  in effect (meaning this test can be used) for the duration of the COVID-19 declaration under Section 564(b)(1) of the Act, 21 U.S.C.section 360bbb-3(b)(1), unless the authorization is terminated  or revoked sooner.       Influenza A by PCR NEGATIVE NEGATIVE Final   Influenza B by PCR NEGATIVE NEGATIVE Final    Comment: (NOTE) The Xpert Xpress SARS-CoV-2/FLU/RSV plus assay is intended as an aid in the diagnosis of influenza from Nasopharyngeal swab specimens and should not be used as a sole basis for treatment. Nasal washings and aspirates are unacceptable for Xpert Xpress SARS-CoV-2/FLU/RSV testing.  Fact Sheet for Patients: EntrepreneurPulse.com.au  Fact Sheet for Healthcare Providers: IncredibleEmployment.be  This test is not yet approved or cleared by the [place] and has been authorized for detection and/or diagnosis of SARS-CoV-2 by FDA under an Emergency Use Authorization (EUA). This EUA will remain in effect (meaning this test can be used) for the duration of the COVID-19 declaration under Section 564(b)(1) of the Act, 21 U.S.C. section 360bbb-3(b)(1), unless the authorization is terminated or revoked.  Performed at  [facility], [address] [place], [place] [address]   Body fluid culture w Gram Stain     Status: None (Preliminary result)   Collection Time: 08/29/20  3:19 PM   Specimen: Synovium; Body Fluid  Result Value Ref Range Status   Specimen Description   Final    SYNOVIAL Performed at [facility], [address] [place], [place] [address]    Special Requests   Final    RIGHT KNEE Performed at [facility], [address] [place], [place] [address]    Gram Stain   Final    NO ORGANISMS SEEN SQUAMOUS EPITHELIAL CELLS PRESENT ABUNDANT WBC PRESENT,BOTH PMN AND MONONUCLEAR NO ORGANISMS SEEN BACTERIA Performed at [facility], [place] [address], [place], [place] [address]    Culture PENDING  Incomplete   Report Status PENDING  Incomplete         Radiology Studies: DG Knee 1-2 Views Right  Result Date: 08/29/2020 CLINICAL DATA:  Knee pain.  No known trauma EXAM: RIGHT KNEE - 1-2 VIEW COMPARISON:  None. FINDINGS: No fracture of the proximal tibia or distal femur. Small suprapatellar joint effusion. Moderate patellar spurring. IMPRESSION: 1. No acute osseous findings. 2. Small suprapatellar joint effusion. 3. Osteoarthritis of patella. Electronically Signed   By: Suzy Bouchard M.D.   On: 08/29/2020 18:33   DG Chest Portable 1 View  Result Date: 08/29/2020 CLINICAL DATA:  Weakness, myalgias evaluate for infiltrate EXAM: PORTABLE CHEST 1 VIEW COMPARISON:  11/29/2019 FINDINGS: Cardiomegaly. Prominent pulmonary vasculature. Suspect heterogeneous airspace opacity of the right lung base. The visualized skeletal structures are unremarkable. IMPRESSION: 1. Suspect heterogeneous airspace opacity of the right lung base, concerning for infection or aspiration. 2.  Cardiomegaly and pulmonary vascular prominence. Electronically Signed   By: Eddie Candle M.D.   On: 08/29/2020 15:32    Scheduled Meds:  asenapine  10 mg Sublingual QHS   atenolol  100 mg Oral  Daily   atorvastatin  10 mg Oral QHS   cloZAPine  150 mg Oral QHS   divalproex  2,000 mg Oral QHS   levothyroxine  75 mcg Oral Q0600   oxybutynin  5 mg Oral Daily   pantoprazole  40 mg Oral Daily     LOS: 0 days   Time spent: 78mn  Saoirse Legere C Rogan Wigley, DO [facility]  If 7PM-7AM, please contact night-coverage [email or web address]  08/30/2020, 7:56 AM

## 2020-08-30 NOTE — Consult Note (Signed)
Reason for Consult: Septic right knee Referring Physician: Dr. Elwin Sleight John [name]. is an 45 y.o. male.  HPI: Patient is a 45 year old male who had onset of right knee pain about 2 weeks ago.  Felt like his right knee gave out and he started having swelling and significant pain following this.  He has not had severe limitation of motion and had aspiration consistent with infection to the right knee.  He also complains of some left knee pain.  He was able to move the left knee he normally is a household ambulator rarely gets out of the home as he lives in a group home secondary to schizophrenia  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 tophus 07/20/2019   Morbid obesity ([place])    Paranoid schizophrenia ([place])    Personality disorder ([place])    Sleep apnea    Thyroid disease     Past Surgical History:  Procedure Laterality Date   FRACTURE SURGERY      Family History  Problem Relation Age of Onset   Glaucoma Mother     Social History:  reports that he has quit smoking. He has never used smokeless tobacco. He reports that he does not currently use alcohol. He reports that he does not use drugs.  Allergies: No Known Allergies  Medications: I have reviewed the patient's current medications.  Results for orders placed or performed during the hospital encounter of 08/29/20 (from the past 48 hour(s))  Basic metabolic panel     Status: Abnormal   Collection Time: 08/29/20  1:24 PM  Result Value Ref Range   Sodium 134 (L) 135 - 145 mmol/L   Potassium 3.8 3.5 - 5.1 mmol/L   Chloride 97 (L) 98 - 111 mmol/L   CO2 28 22 - 32 mmol/L   Glucose, Bld 117 (H) 70 - 99 mg/dL    Comment: Glucose reference range applies only to samples taken after fasting for at least 8 hours.   BUN 10 6 - 20 mg/dL   Creatinine, Ser 0.85 0.61 - 1.24 mg/dL   Calcium 8.8 (L) 8.9 - 10.3 mg/dL   GFR, Estimated >60 >60 mL/min     Comment: (NOTE) Calculated using the CKD-EPI Creatinine Equation (2021)    Anion gap 9 5 - 15    Comment: Performed at [facility], [address] [place], [place] [address]  CBC     Status: Abnormal   Collection Time: 08/29/20  1:24 PM  Result Value Ref Range   WBC 10.2 4.0 - 10.5 K/uL   RBC 4.03 (L) 4.22 - 5.81 MIL/uL   Hemoglobin 10.7 (L) 13.0 - 17.0 g/dL   HCT 34.5 (L) 39.0 - 52.0 %   MCV 85.6 80.0 - 100.0 fL   MCH 26.6 26.0 - 34.0 pg   MCHC 31.0 30.0 - 36.0 g/dL   RDW 14.9 11.5 - 15.5 %   Platelets 214 150 - 400 K/uL   nRBC 0.0 0.0 - 0.2 %    Comment: Performed at [facility], [address] [place], [place] [address]  Urinalysis, Complete w Microscopic     Status: Abnormal   Collection Time: 08/29/20  1:24 PM  Result Value Ref Range   Color, Urine STRAW (A) YELLOW   APPearance CLEAR (A) CLEAR   Specific Gravity, Urine 1.004 (L) 1.005 - 1.030   pH  6.0 5.0 - 8.0   Glucose, UA NEGATIVE NEGATIVE mg/dL   Hgb urine dipstick NEGATIVE NEGATIVE   Bilirubin Urine NEGATIVE NEGATIVE   Ketones, ur NEGATIVE NEGATIVE mg/dL   Protein, ur NEGATIVE NEGATIVE mg/dL   Nitrite NEGATIVE NEGATIVE   Leukocytes,Ua NEGATIVE NEGATIVE   WBC, UA 0-5 0 - 5 WBC/hpf   Bacteria, UA NONE SEEN NONE SEEN   Squamous Epithelial / LPF 0-5 0 - 5    Comment: Performed at [facility] [address] [place], [place] [address]  Procalcitonin     Status: None   Collection Time: 08/29/20  1:24 PM  Result Value Ref Range   Procalcitonin <0.10 ng/mL    Comment:        Interpretation: PCT (Procalcitonin) <= 0.5 ng/mL: Systemic infection (sepsis) is not likely. Local bacterial infection is possible. (NOTE)       Sepsis PCT Algorithm           Lower Respiratory Tract                                      Infection PCT Algorithm    ----------------------------     ----------------------------         PCT < 0.25 ng/mL                PCT < 0.10 ng/mL          Strongly encourage              Strongly discourage   discontinuation of antibiotics    initiation of antibiotics    ----------------------------     -----------------------------       PCT 0.25 - 0.50 ng/mL            PCT 0.10 - 0.25 ng/mL               OR       >80% decrease in PCT            Discourage initiation of                                            antibiotics      Encourage discontinuation           of antibiotics    ----------------------------     -----------------------------         PCT >= 0.50 ng/mL              PCT 0.26 - 0.50 ng/mL               AND        <80% decrease in PCT             Encourage initiation of                                             antibiotics       Encourage continuation           of antibiotics    ----------------------------     -----------------------------        PCT >= 0.50 ng/mL  PCT > 0.50 ng/mL               AND         increase in PCT                  Strongly encourage                                      initiation of antibiotics    Strongly encourage escalation           of antibiotics                                     -----------------------------                                           PCT <= 0.25 ng/mL                                                 OR                                        > 80% decrease in PCT                                      Discontinue / Do not initiate                                             antibiotics  Performed at [facility], [address] [place], [place] [address]   Resp Panel by RT-PCR (Flu A&B, Covid) Nasopharyngeal Swab     Status: None   Collection Time: 08/29/20  3:19 PM   Specimen: Nasopharyngeal Swab; Nasopharyngeal(NP) swabs in vial transport medium  Result Value Ref Range   SARS Coronavirus 2 by RT PCR NEGATIVE NEGATIVE    Comment: (NOTE) SARS-CoV-2 target nucleic acids are NOT DETECTED.  The SARS-CoV-2 RNA is generally detectable in upper  respiratory specimens during the acute phase of infection. The lowest concentration of SARS-CoV-2 viral copies this assay can detect is 138 copies/mL. A negative result does not preclude SARS-Cov-2 infection and should not be used as the sole basis for treatment or other patient management decisions. A negative result may occur with  improper specimen collection/handling, submission of specimen other than nasopharyngeal swab, presence of viral mutation(s) within the areas targeted by this assay, and inadequate number of viral copies(<138 copies/mL). A negative result must be combined with clinical observations, patient history, and epidemiological information. The expected result is Negative.  Fact Sheet for Patients:  EntrepreneurPulse.com.au  Fact Sheet for Healthcare Providers:  IncredibleEmployment.be  This test is no t yet approved or cleared by the [place] FDA and  has been authorized for detection and/or diagnosis of SARS-CoV-2 by FDA under  an Emergency Use Authorization (EUA). This EUA will remain  in effect (meaning this test can be used) for the duration of the COVID-19 declaration under Section 564(b)(1) of the Act, 21 U.S.C.section 360bbb-3(b)(1), unless the authorization is terminated  or revoked sooner.       Influenza A by PCR NEGATIVE NEGATIVE   Influenza B by PCR NEGATIVE NEGATIVE    Comment: (NOTE) The Xpert Xpress SARS-CoV-2/FLU/RSV plus assay is intended as an aid in the diagnosis of influenza from Nasopharyngeal swab specimens and should not be used as a sole basis for treatment. Nasal washings and aspirates are unacceptable for Xpert Xpress SARS-CoV-2/FLU/RSV testing.  Fact Sheet for Patients: EntrepreneurPulse.com.au  Fact Sheet for Healthcare Providers: IncredibleEmployment.be  This test is not yet approved or cleared by the [place] FDA and has been authorized for  detection and/or diagnosis of SARS-CoV-2 by FDA under an Emergency Use Authorization (EUA). This EUA will remain in effect (meaning this test can be used) for the duration of the COVID-19 declaration under Section 564(b)(1) of the Act, 21 U.S.C. section 360bbb-3(b)(1), unless the authorization is terminated or revoked.  Performed at [facility], [address] [place], [place] [address]   Body fluid culture w Gram Stain     Status: None (Preliminary result)   Collection Time: 08/29/20  3:19 PM   Specimen: Synovium; Body Fluid  Result Value Ref Range   Specimen Description      SYNOVIAL Performed at [facility], [address] [place], [place] [address]    Special Requests      RIGHT KNEE Performed at [facility], [place], [place] [address]    Gram Stain      NO ORGANISMS SEEN SQUAMOUS EPITHELIAL CELLS PRESENT ABUNDANT WBC PRESENT,BOTH PMN AND MONONUCLEAR NO ORGANISMS SEEN BACTERIA Performed at [facility], [place] [address] [place], [place] [address]    Culture PENDING    Report Status PENDING   Synovial cell count + diff, w/ crystals     Status: Abnormal   Collection Time: 08/29/20  3:19 PM  Result Value Ref Range   Color, Synovial YELLOW (A) YELLOW   Appearance-Synovial CLOUDY (A) CLEAR   Crystals, Fluid EXTRACELLULAR MONOSODIUM URATE CRYSTALS     Comment: INTRACELLULAR MONOSODIUM URATE CRYSTALS   WBC, Synovial 49,390 (H) 0 - 200 /cu mm   Neutrophil, Synovial 92 %   Lymphocytes-Synovial Fld 3 %   Monocyte-Macrophage-Synovial Fluid 5 %   Eosinophils-Synovial 0 %    Comment: Performed at [facility], [address] [place], [place] [address]  Sedimentation rate     Status: Abnormal   Collection Time: 08/29/20  6:01 PM  Result Value Ref Range   Sed Rate 121 (H) 0 - 16 mm/hr    Comment: Performed at [facility], [address] [place], [place] [address]  Glucose, capillary     Status:  Abnormal   Collection Time: 08/29/20 10:08 PM  Result Value Ref Range   Glucose-Capillary 148 (H) 70 - 99 mg/dL    Comment: Glucose reference range applies only to samples taken after fasting for at least 8 hours.  Basic metabolic panel     Status: Abnormal   Collection Time: 08/30/20  5:38 AM  Result Value Ref Range   Sodium 138 135 - 145 mmol/L   Potassium 4.4 3.5 - 5.1 mmol/L   Chloride 99 98 - 111 mmol/L   CO2 32 22 - 32 mmol/L  Glucose, Bld 157 (H) 70 - 99 mg/dL    Comment: Glucose reference range applies only to samples taken after fasting for at least 8 hours.   BUN 10 6 - 20 mg/dL   Creatinine, Ser 0.76 0.61 - 1.24 mg/dL   Calcium 8.7 (L) 8.9 - 10.3 mg/dL   GFR, Estimated >60 >60 mL/min    Comment: (NOTE) Calculated using the CKD-EPI Creatinine Equation (2021)    Anion gap 7 5 - 15    Comment: Performed at [facility], [address] [place], [place] [address]  CBC with Differential/Platelet     Status: Abnormal   Collection Time: 08/30/20  5:38 AM  Result Value Ref Range   WBC 9.7 4.0 - 10.5 K/uL   RBC 4.29 4.22 - 5.81 MIL/uL   Hemoglobin 11.1 (L) 13.0 - 17.0 g/dL   HCT 36.5 (L) 39.0 - 52.0 %   MCV 85.1 80.0 - 100.0 fL   MCH 25.9 (L) 26.0 - 34.0 pg   MCHC 30.4 30.0 - 36.0 g/dL   RDW 14.8 11.5 - 15.5 %   Platelets 241 150 - 400 K/uL   nRBC 0.0 0.0 - 0.2 %   Neutrophils Relative % 83 %   Neutro Abs 8.0 (H) 1.7 - 7.7 K/uL   Lymphocytes Relative 12 %   Lymphs Abs 1.2 0.7 - 4.0 K/uL   Monocytes Relative 4 %   Monocytes Absolute 0.4 0.1 - 1.0 K/uL   Eosinophils Relative 0 %   Eosinophils Absolute 0.0 0.0 - 0.5 K/uL   Basophils Relative 0 %   Basophils Absolute 0.0 0.0 - 0.1 K/uL   Immature Granulocytes 1 %   Abs Immature Granulocytes 0.12 (H) 0.00 - 0.07 K/uL    Comment: Performed at [facility], [address] [place], [place] [address]    DG Knee 1-2 Views Right  Result Date: 08/29/2020 CLINICAL DATA:  Knee pain.  No known trauma  EXAM: RIGHT KNEE - 1-2 VIEW COMPARISON:  None. FINDINGS: No fracture of the proximal tibia or distal femur. Small suprapatellar joint effusion. Moderate patellar spurring. IMPRESSION: 1. No acute osseous findings. 2. Small suprapatellar joint effusion. 3. Osteoarthritis of patella. Electronically Signed   By: Suzy Bouchard M.D.   On: 08/29/2020 18:33   DG Chest Portable 1 View  Result Date: 08/29/2020 CLINICAL DATA:  Weakness, myalgias evaluate for infiltrate EXAM: PORTABLE CHEST 1 VIEW COMPARISON:  11/29/2019 FINDINGS: Cardiomegaly. Prominent pulmonary vasculature. Suspect heterogeneous airspace opacity of the right lung base. The visualized skeletal structures are unremarkable. IMPRESSION: 1. Suspect heterogeneous airspace opacity of the right lung base, concerning for infection or aspiration. 2.  Cardiomegaly and pulmonary vascular prominence. Electronically Signed   By: Eddie Candle M.D.   On: 08/29/2020 15:32    Review of Systems Blood pressure 129/63, pulse 75, temperature 98.7 F (37.1 C), resp. rate 19, height '6\' 1"'$  (1.854 m), weight 131.5 kg, SpO2 96 %. Physical Exam Right knee has an effusion present he holds the knee in about 30 degrees flexion.  He can be brought out to about 20 degrees and 40 degrees of flexion with severe pain.  No instability to the knee.  He is able to flex extend the toes.  Left knee has minimal swelling and is able to get full extension and flexion to 70 without significant pain Assessment/Plan: Septic right knee Plan is for arthroscopic irrigation debridement with plan to leave the drain in place.  Antibiotics and determination of longer-term antibiotics depending  on culture.  Culture obtained in ER prior to starting antibiotics.  [name] 08/30/2020, 7:20 AM

## 2020-08-30 NOTE — Anesthesia Preprocedure Evaluation (Signed)
Anesthesia Evaluation  Patient identified by MRN, date of birth, ID band Patient awake    Reviewed: Allergy & Precautions, NPO status , Patient's Chart, lab work & pertinent test results  History of Anesthesia Complications Negative for: history of anesthetic complications  Airway Mallampati: III  TM Distance: >3 FB Neck ROM: Full    Dental  (+) Missing, Poor Dentition   Pulmonary asthma , sleep apnea (intermittently uses CPAP) , COPD, former smoker,    breath sounds clear to auscultation- rhonchi (-) wheezing      Cardiovascular hypertension, (-) CAD, (-) Past MI, (-) Cardiac Stents and (-) CABG  Rhythm:Regular Rate:Normal - Systolic murmurs and - Diastolic murmurs    Neuro/Psych neg Seizures PSYCHIATRIC DISORDERS Schizophrenia negative neurological ROS     GI/Hepatic negative GI ROS, Neg liver ROS,   Endo/Other  diabetes, Oral Hypoglycemic AgentsHypothyroidism   Renal/GU negative Renal ROS     Musculoskeletal  (+) Arthritis ,   Abdominal (+) + obese,   Peds  Hematology  (+) anemia ,   Anesthesia Other Findings Past Medical History: No date: Anemia No date: Asthma No date: Diabetes mellitus 07/20/2019: Idiopathic chronic gout of right foot without tophus No date: Morbid obesity ([place]) No date: Paranoid schizophrenia ([place]) No date: Personality disorder ([place]) No date: Sleep apnea No date: Thyroid disease   Reproductive/Obstetrics                             Anesthesia Physical Anesthesia Plan  ASA: 3  Anesthesia Plan: General   Post-op Pain Management:    Induction: Intravenous  PONV Risk Score and Plan: 1 and Ondansetron and Dexamethasone  Airway Management Planned: LMA  Additional Equipment:   Intra-op Plan:   Post-operative Plan:   Informed Consent: I have reviewed the patients History and Physical, chart, labs and discussed the procedure including the risks, benefits  and alternatives for the proposed anesthesia with the patient or authorized representative who has indicated his/her understanding and acceptance.     Dental advisory given  Plan Discussed with: CRNA and Anesthesiologist  Anesthesia Plan Comments:         Anesthesia Quick Evaluation

## 2020-08-30 NOTE — Anesthesia Procedure Notes (Addendum)
Procedure Name: LMA Insertion Date/Time: 08/30/2020 11:27 AM Performed by: Nelda Marseille, CRNA Pre-anesthesia Checklist: Patient identified, Patient being monitored, Timeout performed, Emergency Drugs available and Suction available Patient Re-evaluated:Patient Re-evaluated prior to induction Oxygen Delivery Method: Circle system utilized Preoxygenation: Pre-oxygenation with 100% oxygen Induction Type: IV induction Ventilation: Mask ventilation without difficulty LMA: LMA inserted LMA Size: 5.0 Tube type: Oral Number of attempts: 1 Placement Confirmation: positive ETCO2 and breath sounds checked- equal and bilateral Tube secured with: Tape Dental Injury: Teeth and Oropharynx as per pre-operative assessment

## 2020-08-31 ENCOUNTER — Encounter: Payer: Self-pay | Admitting: Orthopedic Surgery

## 2020-08-31 DIAGNOSIS — E785 Hyperlipidemia, unspecified: Secondary | ICD-10-CM

## 2020-08-31 DIAGNOSIS — A419 Sepsis, unspecified organism: Principal | ICD-10-CM

## 2020-08-31 DIAGNOSIS — G4730 Sleep apnea, unspecified: Secondary | ICD-10-CM

## 2020-08-31 LAB — CHLAMYDIA/NGC RT PCR (ARMC ONLY)
Chlamydia Tr: NOT DETECTED
N gonorrhoeae: NOT DETECTED

## 2020-08-31 LAB — VALPROIC ACID LEVEL: Valproic Acid Lvl: 75 ug/mL (ref 50.0–100.0)

## 2020-08-31 MED ORDER — SODIUM CHLORIDE 0.9 % IV SOLN
2.0000 g | INTRAVENOUS | Status: DC
  Administered 2020-08-31 – 2020-09-02 (×3): 2 g via INTRAVENOUS
  Filled 2020-08-31 (×3): qty 20
  Filled 2020-08-31: qty 2

## 2020-08-31 MED ORDER — VANCOMYCIN HCL 1750 MG/350ML IV SOLN
1750.0000 mg | Freq: Two times a day (BID) | INTRAVENOUS | Status: DC
  Administered 2020-09-01 – 2020-09-02 (×3): 1750 mg via INTRAVENOUS
  Filled 2020-08-31 (×4): qty 350

## 2020-08-31 MED ORDER — VANCOMYCIN HCL 1500 MG/300ML IV SOLN
1500.0000 mg | Freq: Once | INTRAVENOUS | Status: AC
  Administered 2020-08-31: 1500 mg via INTRAVENOUS
  Filled 2020-08-31: qty 300

## 2020-08-31 MED ORDER — VANCOMYCIN HCL IN DEXTROSE 1-5 GM/200ML-% IV SOLN
1000.0000 mg | Freq: Once | INTRAVENOUS | Status: AC
  Administered 2020-08-31: 1000 mg via INTRAVENOUS
  Filled 2020-08-31: qty 200

## 2020-08-31 NOTE — Consult Note (Signed)
Pharmacy Consult - Clozapine     45 yo male ordered clozapine 150 mg PO QHS  This patient's order has been reviewed for prescribing contraindications.    Clozapine REMS enrollment Verified: yes on 08/31/20 REMS patient ID: [id number] Current Outpatient Monitoring: Monthly    Home Regimen: 150 mg PO QHS Last dose: 08/28/20   Dose Adjustments This Admission: N/A   Labs: Date    [place]    Submitted? 8/30 8 Yes     Plan: Continue clozapine 150 mg QHS Monitor ANC at least weekly while inpatient

## 2020-08-31 NOTE — Plan of Care (Signed)

## 2020-08-31 NOTE — Progress Notes (Signed)
Patient ID: John Safer., male   DOB: 1975-08-17, 45 y.o.   MRN: [id number] [facility] PROGRESS NOTE  [name] [id number] DOB: [birth year] DOA: 08/29/2020 PCP: Cletis Athens, MD  HPI/Subjective: Patient admitted 8/29 with weakness and knee pain.  Patient seen by Dr. Rudene Christians and brought to the operating room on 08/30/2020.  Patient complains of some knee soreness.  Patient was sleeping very heavily this morning and I sternal rub him and he fell right back to sleep but had a come reevaluate him later.  Objective: Vitals:   08/31/20 0231 08/31/20 0848  BP: 120/86 120/87  Pulse: 84 84  Resp: 17 16  Temp: 97.9 F (36.6 C) 98.6 F (37 C)  SpO2: 95% 92%    Intake/Output Summary (Last 24 hours) at 08/31/2020 1501 Last data filed at 08/31/2020 1430 Gross per 24 hour  Intake 240 ml  Output 200 ml  Net 40 ml   Filed Weights   08/29/20 1321  Weight: 131.5 kg    ROS: Review of Systems  Respiratory:  Negative for shortness of breath.   Cardiovascular:  Negative for chest pain.  Gastrointestinal:  Negative for abdominal pain, nausea and vomiting.  Musculoskeletal:  Positive for joint pain.  Exam: Physical Exam HENT:     Head: Normocephalic.     Mouth/Throat:     Pharynx: No oropharyngeal exudate.  Cardiovascular:     Rate and Rhythm: Normal rate and regular rhythm.     Heart sounds: Normal heart sounds, S1 normal and S2 normal.  Pulmonary:     Breath sounds: Normal breath sounds. No decreased breath sounds, wheezing, rhonchi or rales.  Abdominal:     Palpations: Abdomen is soft.     Tenderness: There is no abdominal tenderness.  Musculoskeletal:     Right knee: Swelling present.     Right lower leg: No swelling.     Left lower leg: No swelling.     Comments: Right knee covered with bandage.  Drain coming from right knee.  Skin:    General: Skin is warm.     Comments: Patient has healed areas of darkened skin.  Patient states is from spider bites.  Secondary  excoriations on his abdomen  Neurological:     Mental Status: He is alert and oriented to person, place, and time.      Scheduled Meds:  asenapine  10 mg Sublingual QHS   atenolol  100 mg Oral Daily   atorvastatin  10 mg Oral QHS   cloZAPine  150 mg Oral QHS   divalproex  2,000 mg Oral QHS   docusate sodium  100 mg Oral BID   enoxaparin (LOVENOX) injection  40 mg Subcutaneous Q24H   levothyroxine  75 mcg Oral Q0600   pantoprazole  40 mg Oral Daily   Continuous Infusions:  cefTRIAXone (ROCEPHIN)  IV 2 g (08/31/20 1142)    Assessment/Plan:  Sepsis, present on admission with fever of 100.6, tachycardia of 110 and right knee septic joint.  I started Rocephin and vancomycin today.  We will send off GC and chlamydia on the urine and a Lyme Western blot.  Patient states that he has had spider bites. Sleep apnea we will get an overnight oximetry to see if qualifies for nighttime oxygen Essential hypertension on atenolol Hyperlipidemia unspecified on Tenormin Paranoid schizophrenia: Clozapine, Depakote and asenapine. Hypothyroidism unspecified on levothyroxine        Code Status:     Code Status Orders  (From admission,  onward)           Start     Ordered   08/29/20 1739  Full code  Continuous        08/29/20 1740           Code Status History     Date Active Date Inactive Code Status Order ID Comments User Context   11/01/2016 2235 11/14/2016 1912 Full Code [id number]  Lance Coon, MD Inpatient   12/20/2015 1614 12/28/2015 1927 Full Code [id number]  Vilinda Boehringer, MD ED   11/25/2015 1037 11/28/2015 2134 Full Code [id number]  Robbie Lis, MD Inpatient      Family Communication: Left message for patient's mother Disposition Plan: Status is: Inpatient  Dispo: The patient is from: [place]              Anticipated d/c is to: Group home              Patient currently being treated for sepsis and septic arthritis.  Will not be able to go back to the group  home with drain.   Difficult to place patient.  Hopefully not  Consultants: Orthopedic surgery  Procedures: Right knee washout  Antibiotics: -Rocephin and vancomycin  Time spent: 29 minutes.  Had to come back and reevaluate patient once he woke up.  Case discussed with Dr. Rudene Christians orthopedic surgery  Loletha Grayer  [facility]

## 2020-08-31 NOTE — Progress Notes (Signed)
Placed patient on overnight pulse oximetry with bipap in place. No O2 inline at this time. Patient's saturation is 88-91%, no parameters given. No further changes at this time.

## 2020-08-31 NOTE — Progress Notes (Signed)
   Subjective: 1 Day Post-Op Procedure(s) (LRB): ARTHROSCOPY KNEE- I&D, PARTIAL LATERAL MENISCECTOMY (Right) Patient reports right knee pain as mild.   Patient denies any other complaints We will continue therapy today.   Objective: Vital signs in last 24 hours: Temp:  [96.8 F (36 C)-98.3 F (36.8 C)] 97.9 F (36.6 C) (08/31 0231) Pulse Rate:  [75-97] 84 (08/31 0231) Resp:  [10-19] 17 (08/31 0231) BP: (119-140)/(81-98) 120/86 (08/31 0231) SpO2:  [90 %-100 %] 95 % (08/31 0231)  Intake/Output from previous day: 08/30 0701 - 08/31 0700 In: 600 [I.V.:600] Out: 255 [Urine:250; Blood:5] Intake/Output this shift: No intake/output data recorded.  Recent Labs    08/29/20 1324 08/30/20 0538  HGB 10.7* 11.1*   Recent Labs    08/29/20 1324 08/30/20 0538  WBC 10.2 9.7  RBC 4.03* 4.29  HCT 34.5* 36.5*  PLT 214 241   Recent Labs    08/29/20 1324 08/30/20 0538  NA 134* 138  K 3.8 4.4  CL 97* 99  CO2 28 32  BUN 10 10  CREATININE 0.85 0.76  GLUCOSE 117* 157*  CALCIUM 8.8* 8.7*   No results for input(s): LABPT, INR in the last 72 hours.  EXAM General - Patient is Alert, Appropriate, and Oriented Extremity - Neurovascular intact Sensation intact distally Intact pulses distally Dorsiflexion/Plantar flexion intact No cellulitis present Compartment soft Dressing - dressing C/D/I, no drainage, and hemovac intact Motor Function - intact, moving foot and toes well on exam.   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 tophus 07/20/2019   Morbid obesity ([place])    Paranoid schizophrenia ([place])    Personality disorder ([place])    Sleep apnea    Thyroid disease     Assessment/Plan:   1 Day Post-Op Procedure(s) (LRB): ARTHROSCOPY KNEE- I&D, PARTIAL LATERAL MENISCECTOMY (Right) Principal Problem:   Septic joint ([name]) Active Problems:   Morbid obesity ([place])   Essential hypertension    Hypothyroidism   Paranoid schizophrenia, chronic condition ([place])   Tobacco abuse   Knee arthropathy   Bilateral leg weakness   OSA on CPAP   OSA treated with BiPAP   Bronchitis   Community acquired pneumonia   Infection of left knee ([place])  Estimated body mass index is 38.26 kg/m as calculated from the following:   Height as of this encounter: '6\' 1"'$  (1.854 m).   Weight as of this encounter: 131.5 kg. Advance diet Up with therapy Continue with IV abx Cultures/sensitivities pending CM to assist with discharge   DVT Prophylaxis - Lovenox and TED hose Weight-Bearing as tolerated to right leg   T. Rachelle Hora, PA-C [place] 08/31/2020, 8:14 AM

## 2020-08-31 NOTE — Consult Note (Signed)
Pharmacy Antibiotic Note  John [name]. is a 45 y.o. male admitted on 08/29/2020 with septic knee s/p arthroscopy with I&D on 8/30 Pharmacy has been consulted for vancomycin dosing.  Plan: Vancomycin 2500 mg IV loading dose, followed by 1750 mg IV q12h Goal AUC 400-550  Est AUC: 458.6 Est Cmax: 35.4 Est Cmin: 11.1 Calculated with SCr 0.8, Vd 0.5  Pt is also ordered ceftriaxone 2 g IV q24h  Monitor clinical picture, renal function, and vancomycin levels at steady state F/U C&S, abx deescalation / LOT    Height: '6\' 1"'$  (185.4 cm) Weight: 131.5 kg (290 lb) IBW/kg (Calculated) : 79.9  Temp (24hrs), Avg:97.9 F (36.6 C), Min:97.5 F (36.4 C), Max:98.6 F (37 C)  Recent Labs  Lab 08/29/20 1324 08/30/20 0538  WBC 10.2 9.7  CREATININE 0.85 0.76    Estimated Creatinine Clearance: 169.2 mL/min (by C-G formula based on SCr of 0.76 mg/dL).    No Known Allergies  Antimicrobials this admission: 8/31 Ceftriaxone >>  8/31 vancomycin >>   Dose adjustments this admission: N/A  Microbiology results: 8/29: synovial fluid cx: pending   Thank you for allowing pharmacy to be a part of this patient's care.  Darnelle Bos, PharmD 08/31/2020 3:26 PM

## 2020-09-01 ENCOUNTER — Inpatient Hospital Stay: Payer: 59

## 2020-09-01 DIAGNOSIS — E662 Morbid (severe) obesity with alveolar hypoventilation: Secondary | ICD-10-CM

## 2020-09-01 LAB — BLOOD GAS, ARTERIAL
Acid-Base Excess: 6.6 mmol/L — ABNORMAL HIGH (ref 0.0–2.0)
Bicarbonate: 32.3 mmol/L — ABNORMAL HIGH (ref 20.0–28.0)
FIO2: 21
O2 Saturation: 92.9 %
Patient temperature: 37
pCO2 arterial: 51 mmHg — ABNORMAL HIGH (ref 32.0–48.0)
pH, Arterial: 7.41 (ref 7.350–7.450)
pO2, Arterial: 66 mmHg — ABNORMAL LOW (ref 83.0–108.0)

## 2020-09-01 LAB — GLUCOSE, CAPILLARY
Glucose-Capillary: 168 mg/dL — ABNORMAL HIGH (ref 70–99)
Glucose-Capillary: 166 mg/dL — ABNORMAL HIGH (ref 70–99)
Glucose-Capillary: 149 mg/dL — ABNORMAL HIGH (ref 70–99)

## 2020-09-01 LAB — CREATININE, SERUM
Creatinine, Ser: 0.6 mg/dL — ABNORMAL LOW (ref 0.61–1.24)
GFR, Estimated: >60 mL/min (ref >60)

## 2020-09-01 LAB — HEMOGLOBIN A1C
Hgb A1c MFr Bld: 6.5 % — ABNORMAL HIGH (ref 4.8–5.6)
Mean Plasma Glucose: 139.85 mg/dL

## 2020-09-01 MED ORDER — SODIUM CHLORIDE 0.9 % IV SOLN
INTRAVENOUS | Status: DC | PRN
  Administered 2020-09-01: 250 mL via INTRAVENOUS

## 2020-09-01 MED ORDER — IPRATROPIUM-ALBUTEROL 0.5-2.5 (3) MG/3ML IN SOLN
3.0000 mL | Freq: Four times a day (QID) | RESPIRATORY_TRACT | Status: DC
  Administered 2020-09-01 – 2020-09-02 (×4): 3 mL via RESPIRATORY_TRACT
  Filled 2020-09-01 (×5): qty 3

## 2020-09-01 MED ORDER — INSULIN ASPART 100 UNIT/ML IJ SOLN
0.0000 [IU] | Freq: Every day | INTRAMUSCULAR | Status: DC
Start: 2020-09-01 — End: 2020-09-04
  Administered 2020-09-02: 2 [IU] via SUBCUTANEOUS
  Filled 2020-09-01: qty 1

## 2020-09-01 MED ORDER — INSULIN ASPART 100 UNIT/ML IJ SOLN
0.0000 [IU] | Freq: Three times a day (TID) | INTRAMUSCULAR | Status: DC
  Administered 2020-09-01 (×2): 2 [IU] via SUBCUTANEOUS
  Administered 2020-09-02: 3 [IU] via SUBCUTANEOUS
  Administered 2020-09-02: 2 [IU] via SUBCUTANEOUS
  Administered 2020-09-03: 3 [IU] via SUBCUTANEOUS
  Administered 2020-09-03: 5 [IU] via SUBCUTANEOUS
  Administered 2020-09-03: 3 [IU] via SUBCUTANEOUS
  Filled 2020-09-01 (×7): qty 1

## 2020-09-01 NOTE — Progress Notes (Signed)
   Subjective: 2 Days Post-Op Procedure(s) (LRB): ARTHROSCOPY KNEE- I&D, PARTIAL LATERAL MENISCECTOMY (Right) Patient reports right knee pain as mild.   Patient denies any other complaints We will continue therapy today.   Objective: Vital signs in last 24 hours: Temp:  [97.5 F (36.4 C)-99.1 F (37.3 C)] 99.1 F (37.3 C) (09/01 0808) Pulse Rate:  [94-105] 101 (09/01 0808) Resp:  [16-20] 20 (09/01 0808) BP: (113-136)/(78-98) 136/98 (09/01 0808) SpO2:  [88 %-92 %] 92 % (09/01 0808)  Intake/Output from previous day: 08/31 0701 - 09/01 0700 In: 480 [P.O.:480] Out: 870 [Urine:800; Drains:70] Intake/Output this shift: Total I/O In: 360 [P.O.:360] Out: -   Recent Labs    08/29/20 1324 08/30/20 0538  HGB 10.7* 11.1*   Recent Labs    08/29/20 1324 08/30/20 0538  WBC 10.2 9.7  RBC 4.03* 4.29  HCT 34.5* 36.5*  PLT 214 241   Recent Labs    08/29/20 1324 08/30/20 0538 09/01/20 0853  NA 134* 138  --   K 3.8 4.4  --   CL 97* 99  --   CO2 28 32  --   BUN 10 10  --   CREATININE 0.85 0.76 0.60*  GLUCOSE 117* 157*  --   CALCIUM 8.8* 8.7*  --    No results for input(s): LABPT, INR in the last 72 hours.  EXAM General - Patient is Alert, Appropriate, and Oriented Extremity - Neurovascular intact Sensation intact distally Intact pulses distally Dorsiflexion/Plantar flexion intact No cellulitis present Compartment soft Dressing - dressing C/D/I, no drainage, and hemovac intact Motor Function - intact, moving foot and toes well on exam.   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 tophus 07/20/2019   Morbid obesity ([place])    Paranoid schizophrenia ([place])    Personality disorder ([place])    Sleep apnea    Thyroid disease     Assessment/Plan:   2 Days Post-Op Procedure(s) (LRB): ARTHROSCOPY KNEE- I&D, PARTIAL LATERAL MENISCECTOMY (Right) Principal Problem:   Septic joint  ([name]) Active Problems:   Morbid obesity ([place])   Essential hypertension   Hypothyroidism   Paranoid schizophrenia, chronic condition ([place])   Tobacco abuse   Knee arthropathy   Bilateral leg weakness   OSA on CPAP   OSA treated with BiPAP   Bronchitis   Community acquired pneumonia   Arthritis, septic, knee ([place])   Hyperlipidemia  Estimated body mass index is 38.26 kg/m as calculated from the following:   Height as of this encounter: '6\' 1"'$  (1.854 m).   Weight as of this encounter: 131.5 kg. Advance diet Up with therapy Continue with IV abx Cultures/sensitivities pending. Currently no growth.  CM to assist with discharge to group home Drain removed today  Follow up with [facility] ortho first of next week for recheck Keep incision sites clean dry and covered   DVT Prophylaxis - Lovenox and TED hose Weight-Bearing as tolerated to right leg   T. Rachelle Hora, PA-C [place] 09/01/2020, 11:10 AM

## 2020-09-01 NOTE — Progress Notes (Signed)
PT Cancellation Note  Patient Details Name: John [name]. MRN: [id number] DOB: [birth year]   Cancelled Treatment:    Reason Eval/Treat Not Completed: Fatigue/lethargy limiting ability to participate. Patient sleeping. Able to rouse briefly, but he goes right back to sleep. Unable to participate this pm with PT evaluation. Will re-attempt tomorrow.    Sumeya Yontz 09/01/2020, 3:33 PM

## 2020-09-01 NOTE — TOC Progression Note (Signed)
Transition of Care (TOC) - Progression Note    Patient Details  Name: John [name]. MRN: [id number] Date of Birth: 08-16-1975  Transition of Care [facility]) CM/SW Contact  Su Hilt, RN Phone Number: 09/01/2020, 10:35 AM  Clinical Narrative:     Spoke with the patients mother and provided her with the hospital address, The patient lives in [place] [facility],, I will call the Group home to arrange [facility] services for the patient after PT evaluation completed and see if there are any DME he needs The [facility] is [phone]        Expected Discharge Plan and Services                                                 Social Determinants of Health (SDOH) Interventions    Readmission Risk Interventions No flowsheet data found.

## 2020-09-01 NOTE — Progress Notes (Signed)
Patient ID: Tor Sammon., male   DOB: 05/27/1975, 45 y.o.   MRN: [id number] [facility] PROGRESS NOTE  [name] [id number] DOB: 08/25/1975 DOA: 08/29/2020 PCP: Cletis Athens, MD  HPI/Subjective: Patient at times is a lethargic.  In speaking with respiratory therapist they have his BiPAP settings at a lower pressures than he takes at home and they will adjust his settings in the orders.  Patient still complains of right knee pain.  He has some difficulty getting around.  Admitted with sepsis and septic right knee.  Patient vomited last night and has wheezing today.  Objective: Vitals:   09/01/20 1247 09/01/20 1419  BP:    Pulse:    Resp:    Temp: 99.7 F (37.6 C) 98.6 F (37 C)  SpO2:      Intake/Output Summary (Last 24 hours) at 09/01/2020 1435 Last data filed at 09/01/2020 1358 Gross per 24 hour  Intake 720 ml  Output 870 ml  Net -150 ml   Filed Weights   08/29/20 1321  Weight: 131.5 kg    ROS: Review of Systems  Respiratory:  Positive for shortness of breath and wheezing.   Cardiovascular:  Negative for chest pain.  Gastrointestinal:  Negative for abdominal pain, nausea and vomiting.  Musculoskeletal:  Positive for joint pain.  Exam: Physical Exam HENT:     Head: Normocephalic.     Mouth/Throat:     Pharynx: No oropharyngeal exudate.  Eyes:     General: Lids are normal.     Conjunctiva/sclera: Conjunctivae normal.     Pupils: Pupils are equal, round, and reactive to light.  Cardiovascular:     Rate and Rhythm: Normal rate and regular rhythm.     Heart sounds: Normal heart sounds, S1 normal and S2 normal.  Pulmonary:     Breath sounds: Examination of the right-middle field reveals decreased breath sounds. Examination of the left-middle field reveals decreased breath sounds and wheezing. Examination of the right-lower field reveals decreased breath sounds and wheezing. Examination of the left-lower field reveals decreased breath sounds and  wheezing. Decreased breath sounds and wheezing present.  Abdominal:     Palpations: Abdomen is soft.     Tenderness: There is no abdominal tenderness.  Musculoskeletal:     Right knee: Swelling present.     Right lower leg: No swelling.     Left lower leg: No swelling.  Skin:    General: Skin is warm.     Findings: No rash.  Neurological:     Mental Status: He is alert and oriented to person, place, and time.      Scheduled Meds:  asenapine  10 mg Sublingual QHS   atenolol  100 mg Oral Daily   atorvastatin  10 mg Oral QHS   cloZAPine  150 mg Oral QHS   divalproex  2,000 mg Oral QHS   docusate sodium  100 mg Oral BID   enoxaparin (LOVENOX) injection  40 mg Subcutaneous Q24H   insulin aspart  0-5 Units Subcutaneous QHS   insulin aspart  0-9 Units Subcutaneous TID WC   ipratropium-albuterol  3 mL Nebulization Q6H   levothyroxine  75 mcg Oral Q0600   pantoprazole  40 mg Oral Daily   Continuous Infusions:  cefTRIAXone (ROCEPHIN)  IV 2 g (09/01/20 0952)   vancomycin 1,750 mg (09/01/20 0522)    Assessment/Plan:  Sepsis, present on admission with fever 100.6, tachycardia of 110 and right knee septic joint.  Continue Rocephin and vancomycin.  So far cultures show no growth.  GC in the urine negative.  Lyme Western blot pending.  Patient states that he has spider bites.  Orthopedic surgery remove the drain today. Wheeze.  Nebulizer treatments ordered.  Chest x-ray negative for pneumonia. Sleep apnea with CO2 retention, likely obesity hypoventilation.  Continue BiPAP at night.  Respiratory therapist will increase his settings to 24/17 that he is been wearing at home. Essential hypertension on atenolol Hyperlipidemia unspecified on atorvastatin Paranoid schizophrenia on clozapine, Depakote and asenapine. Hypothyroidism unspecified on levothyroxine Weakness.  We will get physical therapy evaluation.    Code Status:     Code Status Orders  (From admission, onward)            Start     Ordered   08/29/20 1739  Full code  Continuous        08/29/20 1740           Code Status History     Date Active Date Inactive Code Status Order ID Comments User Context   11/01/2016 2235 11/14/2016 1912 Full Code [id number]  Lance Coon, MD Inpatient   12/20/2015 1614 12/28/2015 1927 Full Code [id number]  Vilinda Boehringer, MD ED   11/25/2015 1037 11/28/2015 2134 Full Code [id number]  Robbie Lis, MD Inpatient      Family Communication: Spoke with patient's mother on the phone Disposition Plan: Status is: Inpatient  Dispo: The patient is from: [place]              Anticipated d/c is to: Group home              Patient currently with wheezing today.  Adjusting patient's BiPAP settings.  Still requiring IV antibiotics and having low-grade temperature today.   Difficult to place patient.  No.  Consultants: Orthopedic surgery  Procedures: Right knee washout  Antibiotics: Vancomycin and Rocephin  Time spent: 28 minutes, case discussed with orthopedic surgery and respiratory therapy and nursing staff.  [place]  [facility] [employer]

## 2020-09-02 DIAGNOSIS — M109 Gout, unspecified: Secondary | ICD-10-CM

## 2020-09-02 LAB — SYNOVIAL CELL COUNT + DIFF, W/ CRYSTALS
Eosinophils-Synovial: 0 %
Lymphocytes-Synovial Fld: 1 %
Monocyte-Macrophage-Synovial Fluid: 10 %
Neutrophil, Synovial: 89 %
WBC, Synovial: 10469 /mm3 — ABNORMAL HIGH (ref 0–200)

## 2020-09-02 LAB — BODY FLUID CULTURE W GRAM STAIN: Culture: NO GROWTH

## 2020-09-02 LAB — CREATININE, SERUM
Creatinine, Ser: 0.68 mg/dL (ref 0.61–1.24)
GFR, Estimated: >60 mL/min (ref >60)

## 2020-09-02 LAB — URIC ACID: Uric Acid, Serum: 6.9 mg/dL (ref 3.7–8.6)

## 2020-09-02 LAB — GLUCOSE, CAPILLARY
Glucose-Capillary: 118 mg/dL — ABNORMAL HIGH (ref 70–99)
Glucose-Capillary: 223 mg/dL — ABNORMAL HIGH (ref 70–99)
Glucose-Capillary: 153 mg/dL — ABNORMAL HIGH (ref 70–99)
Glucose-Capillary: 212 mg/dL — ABNORMAL HIGH (ref 70–99)

## 2020-09-02 LAB — SEDIMENTATION RATE: Sed Rate: 92 mm/hr — ABNORMAL HIGH (ref 0–15)

## 2020-09-02 MED ORDER — MELOXICAM 7.5 MG PO TABS
15.0000 mg | ORAL_TABLET | Freq: Every day | ORAL | Status: DC
  Administered 2020-09-02 – 2020-09-03 (×2): 15 mg via ORAL
  Filled 2020-09-02 (×3): qty 2

## 2020-09-02 MED ORDER — COLCHICINE 0.6 MG PO TABS
0.6000 mg | ORAL_TABLET | Freq: Once | ORAL | Status: AC
  Administered 2020-09-02: 0.6 mg via ORAL
  Filled 2020-09-02: qty 1

## 2020-09-02 MED ORDER — PREDNISONE 20 MG PO TABS
20.0000 mg | ORAL_TABLET | Freq: Every day | ORAL | Status: DC
  Administered 2020-09-02 – 2020-09-03 (×2): 20 mg via ORAL
  Filled 2020-09-02 (×2): qty 1

## 2020-09-02 MED ORDER — COLCHICINE 0.6 MG PO TABS
0.6000 mg | ORAL_TABLET | Freq: Every day | ORAL | Status: DC
  Administered 2020-09-02 – 2020-09-03 (×2): 0.6 mg via ORAL
  Filled 2020-09-02 (×2): qty 1

## 2020-09-02 MED ORDER — IPRATROPIUM-ALBUTEROL 0.5-2.5 (3) MG/3ML IN SOLN
3.0000 mL | Freq: Three times a day (TID) | RESPIRATORY_TRACT | Status: DC
  Administered 2020-09-02 – 2020-09-03 (×2): 3 mL via RESPIRATORY_TRACT
  Filled 2020-09-02 (×2): qty 3

## 2020-09-02 NOTE — Progress Notes (Signed)
   Subjective: 3 Days Post-Op Procedure(s) (LRB): ARTHROSCOPY KNEE- I&D, PARTIAL LATERAL MENISCECTOMY (Right) Patient reports right knee pain as mild.   Patient denies any other complaints We will continue therapy today.   Objective: Vital signs in last 24 hours: Temp:  [97.6 F (36.4 C)-99.7 F (37.6 C)] 98.8 F (37.1 C) (09/02 0545) Pulse Rate:  [87-114] 101 (09/02 0545) Resp:  [16-24] 20 (09/02 0545) BP: (98-115)/(61-80) 110/72 (09/02 0545) SpO2:  [90 %-99 %] 99 % (09/02 0751)  Intake/Output from previous day: 09/01 0701 - 09/02 0700 In: 1819.5 [P.O.:960; I.V.:3.1; IV Piggyback:856.4] Out: 700 [Urine:700] Intake/Output this shift: No intake/output data recorded.  No results for input(s): HGB in the last 72 hours.  No results for input(s): WBC, RBC, HCT, PLT in the last 72 hours.  Recent Labs    09/01/20 0853 09/02/20 0419  CREATININE 0.60* 0.68   No results for input(s): LABPT, INR in the last 72 hours.  EXAM General - Patient is Alert, Appropriate, and Oriented Extremity - Neurovascular intact Sensation intact distally Intact pulses distally Dorsiflexion/Plantar flexion intact No cellulitis present Compartment soft Dressing - dressing C/D/I and no drainage, dressing removed and new dressing applied Motor Function - intact, moving foot and toes well on exam.   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 tophus 07/20/2019   Morbid obesity ([place])    Paranoid schizophrenia ([place])    Personality disorder ([place])    Sleep apnea    Thyroid disease     Assessment/Plan:   3 Days Post-Op Procedure(s) (LRB): ARTHROSCOPY KNEE- I&D, PARTIAL LATERAL MENISCECTOMY (Right) Principal Problem:   Septic joint ([place]) Active Problems:   Morbid obesity ([place])   Essential hypertension   Hypothyroidism   Paranoid schizophrenia, chronic condition ([place])   Tobacco abuse   Knee arthropathy    Bilateral leg weakness   OSA on CPAP   OSA treated with BiPAP   Bronchitis   Community acquired pneumonia   Arthritis, septic, knee ([place])   Hyperlipidemia   Obesity hypoventilation syndrome ([place])  Estimated body mass index is 38.26 kg/m as calculated from the following:   Height as of this encounter: '6\' 1"'$  (1.854 m).   Weight as of this encounter: 131.5 kg. Advance diet Up with therapy Continue with IV abx, transition to p.o. antibiotics Cultures/sensitivities pending. Currently no growth.  CM to assist with discharge to group home today   Follow up with [facility] ortho first of next week for recheck Keep incision sites clean dry and covered   DVT Prophylaxis - Lovenox and TED hose Weight-Bearing as tolerated to right leg   T. Rachelle Hora, PA-C [place] 09/02/2020, 8:18 AM

## 2020-09-02 NOTE — Evaluation (Signed)
Physical Therapy Evaluation Patient Details Name: John [name]. MRN: [id number] DOB: Jun 07, 1975 Today's Date: 09/02/2020   History of Present Illness  John [name]. is a 45 y.o. male with medical history significant for schizophrenia, obesity, OSA, noncompliant with BiPAP, hypertension, hyperlipidemia, asthma, presents emergency department from group home with staff for chief concerns of weakness.     He reports that he has been feeling weak for about 2 weeks.  He states that he fell on his knees about 2 weeks ago.  He reports that the right knee was hurting first and then the left knee. Patient is s/p I &D and Right lateral partial meniscus repair 08/30/20.   Clinical Impression  Patient received in bed, sleeping. He is very lethargic and easily falls back to sleep initially. Requires increased stimuli to remain awake and participate. Patient performed bed mobility with min assist, he is unable to stand from bed unless bed is significantly ( almost maximally)  elevated. Patient able to take a few side steps along edge of bed and then ambulated 10 feet with +2 min guard/assist. Patient limited by left knee pain and weakness with weight bearing. He will continue to benefit from skilled PT while here to improve functional independence and safety. Patient is not safe to return to group home at this time as he will not received the help he needs.        Follow Up Recommendations SNF;Supervision for mobility/OOB    Equipment Recommendations  Rolling walker with 5" wheels    Recommendations for Other Services OT consult     Precautions / Restrictions Precautions Precautions: Fall Restrictions Weight Bearing Restrictions: Yes RLE Weight Bearing: Weight bearing as tolerated      Mobility  Bed Mobility Overal bed mobility: Needs Assistance Bed Mobility: Supine to Sit;Sit to Supine     Supine to sit: Min guard Sit to supine: Min guard   General bed mobility comments: use of bed  rails, increased time    Transfers Overall transfer level: Needs assistance Equipment used: Rolling walker (2 wheeled) Transfers: Sit to/from Stand Sit to Stand: From elevated surface;Mod assist;+2 physical assistance         General transfer comment: Patient unable to stand from low bed. With bed very elevated he is able to stand with min A. From moderately elevated bed required +2 mod assist to stand.  Ambulation/Gait Ambulation/Gait assistance: Min assist;+2 physical assistance Gait Distance (Feet): 10 Feet Assistive device: Rolling walker (2 wheeled) Gait Pattern/deviations: Step-to pattern;Decreased step length - right;Decreased step length - left;Shuffle;Decreased weight shift to left;Trunk flexed Gait velocity: decr   General Gait Details: patient with difficulty ambulating due to reported left knee pain and weakness. Requires +2 assist for safety at this time and use of RW.  Stairs            Wheelchair Mobility    Modified Rankin (Stroke Patients Only)       Balance Overall balance assessment: Needs assistance Sitting-balance support: Feet supported Sitting balance-Leahy Scale: Good Sitting balance - Comments: able to sit unsupported   Standing balance support: Bilateral upper extremity supported;During functional activity Standing balance-Leahy Scale: Fair Standing balance comment: Reliant on B UE support and external assist for safety                             Pertinent Vitals/Pain Pain Assessment: Faces Faces Pain Scale: Hurts little more Pain Location: L knee Pain Descriptors /  Indicators: Discomfort;Sore;Guarding;Grimacing Pain Intervention(s): Monitored during session;Repositioned    Home Living Family/patient expects to be discharged to:: Skilled nursing facility                      Prior Function Level of Independence: Independent         Comments: Patient reports that he spends a lot of time in bed, watches a  lot of TV. Reports he was able to get up and walk around at the group home without assistance.     Hand Dominance        Extremity/Trunk Assessment   Upper Extremity Assessment Upper Extremity Assessment: Overall WFL for tasks assessed    Lower Extremity Assessment Lower Extremity Assessment: LLE deficits/detail LLE Deficits / Details: painful L knee, difficulty with weight bearing and lifting left knee, slight buckling and hyperextension with weight bearing LLE: Unable to fully assess due to pain LLE Coordination: decreased gross motor    Cervical / Trunk Assessment Cervical / Trunk Assessment: Normal  Communication   Communication: Expressive difficulties  Cognition Arousal/Alertness: Lethargic Behavior During Therapy: WFL for tasks assessed/performed Overall Cognitive Status: History of cognitive impairments - at baseline                                        General Comments      Exercises     Assessment/Plan    PT Assessment Patient needs continued PT services  PT Problem List Decreased strength;Decreased mobility;Decreased activity tolerance;Decreased balance;Pain;Decreased knowledge of precautions;Decreased safety awareness;Decreased cognition;Obesity       PT Treatment Interventions DME instruction;Therapeutic exercise;Gait training;Balance training;Functional mobility training;Therapeutic activities;Patient/family education    PT Goals (Current goals can be found in the Care Plan section)  Acute Rehab PT Goals Patient Stated Goal: to get stronger PT Goal Formulation: With patient Time For Goal Achievement: 09/16/20 Potential to Achieve Goals: Fair    Frequency Min 2X/week   Barriers to discharge Decreased caregiver support      Co-evaluation               AM-PAC PT "6 Clicks" Mobility  Outcome Measure Help needed turning from your back to your side while in a flat bed without using bedrails?: A Lot Help needed moving from  lying on your back to sitting on the side of a flat bed without using bedrails?: A Lot Help needed moving to and from a bed to a chair (including a wheelchair)?: A Lot Help needed standing up from a chair using your arms (e.g., wheelchair or bedside chair)?: A Lot Help needed to walk in hospital room?: A Lot Help needed climbing 3-5 steps with a railing? : A Lot 6 Click Score: 12    End of Session Equipment Utilized During Treatment: Gait belt Activity Tolerance: Patient limited by fatigue;Patient limited by pain;Patient limited by lethargy Patient left: in bed;with call bell/phone within reach;with bed alarm set Nurse Communication: Mobility status PT Visit Diagnosis: Unsteadiness on feet (R26.81);Other abnormalities of gait and mobility (R26.89);Muscle weakness (generalized) (M62.81);Pain Pain - Right/Left: Left Pain - part of body: Knee    Time: [id number] PT Time Calculation (min) (ACUTE ONLY): 33 min   Charges:   PT Evaluation $PT Eval Moderate Complexity: 1 Mod PT Treatments $Gait Training: 8-22 mins        Lismary Kiehn, PT, GCS 09/02/20,10:30 AM

## 2020-09-02 NOTE — Procedures (Signed)
After informed consent was obtained an 18-gauge needle was inserted in the superior lateral portal after thoroughly prepping the skin approximately 60 cc withdrawn and the last bit of this was sent as a specimen for synovial fluid analysis.  Patient tolerated procedure well and Band-Aid applied

## 2020-09-02 NOTE — Plan of Care (Signed)
Patient alert and oriented x 4, denies pain with assess. Required frequent reminders to keep Bipap on. Mews noted at 0340 for elevated HR at 114, charge nurse and NP notified. Mews resolved with follow up at 0350, protocol was initiated. Patient asymptomatic, denies chest pain or discomfort. Vitals remain stable. Will continue to monitor.  Problem: Education: Goal: Knowledge of General Education information will improve Description: Including pain rating scale, medication(s)/side effects and non-pharmacologic comfort measures Outcome: Progressing   Problem: Health Behavior/Discharge Planning: Goal: Ability to manage health-related needs will improve Outcome: Progressing   Problem: Clinical Measurements: Goal: Ability to maintain clinical measurements within normal limits will improve Outcome: Progressing Goal: Will remain free from infection Outcome: Progressing Goal: Diagnostic test results will improve Outcome: Progressing Goal: Respiratory complications will improve Outcome: Progressing Goal: Cardiovascular complication will be avoided Outcome: Progressing   Problem: Activity: Goal: Risk for activity intolerance will decrease Outcome: Progressing   Problem: Nutrition: Goal: Adequate nutrition will be maintained Outcome: Progressing   Problem: Coping: Goal: Level of anxiety will decrease Outcome: Progressing   Problem: Elimination: Goal: Will not experience complications related to bowel motility Outcome: Progressing Goal: Will not experience complications related to urinary retention Outcome: Progressing   Problem: Pain Managment: Goal: General experience of comfort will improve Outcome: Progressing   Problem: Safety: Goal: Ability to remain free from injury will improve Outcome: Progressing

## 2020-09-02 NOTE — Consult Note (Signed)
Pharmacy Antibiotic Note  John [name]. is a 45 y.o. male admitted on 08/29/2020 with septic knee s/p arthroscopy with I&D on 8/30 Pharmacy has been consulted for vancomycin dosing.  09/02/20, Abx day 3  Plan: Vancomycin 2500 mg IV loading dose, followed by 1750 mg IV q12h Goal AUC 400-550  Est AUC: 458.6 Est Cmax: 35.4 Est Cmin: 11.1 Calculated with SCr 0.8, Vd 0.5  Pt is also ordered ceftriaxone 2 g IV q24h  Monitor clinical picture, renal function, and vancomycin levels at steady state F/U C&S, abx deescalation / LOT    Height: '6\' 1"'$  (185.4 cm) Weight: 131.5 kg (290 lb) IBW/kg (Calculated) : 79.9  Temp (24hrs), Avg:98.4 F (36.9 C), Min:97.6 F (36.4 C), Max:98.8 F (37.1 C)  Recent Labs  Lab 08/29/20 1324 08/30/20 0538 09/01/20 0853 09/02/20 0419  WBC 10.2 9.7  --   --   CREATININE 0.85 0.76 0.60* 0.68     Estimated Creatinine Clearance: 169.2 mL/min (by C-G formula based on SCr of 0.68 mg/dL).    No Known Allergies  Antimicrobials this admission: 8/31 Ceftriaxone >>  8/31 vancomycin >>   Dose adjustments this admission: N/A  Microbiology results: 8/29: synovial fluid cx: NG x 3 days    Thank you for allowing pharmacy to be a part of this patient's care.  Darnelle Bos, PharmD 09/02/2020 2:16 PM

## 2020-09-02 NOTE — Progress Notes (Signed)
Patient ID: John Janow., male   DOB: January 05, 1975, 44 y.o.   MRN: [id number] [facility] PROGRESS NOTE  John [name]. [id number] DOB: [birth year] DOA: 08/29/2020 PCP: John Athens, MD  HPI/Subjective: Patient complaining of left knee pain today.  Still has right knee pain from postoperative procedure.  Patient with history of gout.  I started gout treatment earlier today.  Orthopedic surgery came back and aspirated left knee.  Breathing better today.  No cough or wheeze.  Objective: Vitals:   09/02/20 1317 09/02/20 1319  BP:    Pulse:  100  Resp:  15  Temp:    SpO2: 96% 96%    Intake/Output Summary (Last 24 hours) at 09/02/2020 1537 Last data filed at 09/02/2020 1247 Gross per 24 hour  Intake 1819.54 ml  Output 850 ml  Net 969.54 ml   Filed Weights   08/29/20 1321  Weight: 131.5 kg    ROS: Review of Systems  Respiratory:  Negative for shortness of breath and wheezing.   Gastrointestinal:  Negative for abdominal pain, nausea and vomiting.  Musculoskeletal:  Positive for joint pain.  Exam: Physical Exam HENT:     Head: Normocephalic.     Mouth/Throat:     Pharynx: No oropharyngeal exudate.  Eyes:     General: Lids are normal.     Conjunctiva/sclera: Conjunctivae normal.  Cardiovascular:     Rate and Rhythm: Normal rate and regular rhythm.     Heart sounds: Normal heart sounds, S1 normal and S2 normal.  Pulmonary:     Breath sounds: Normal breath sounds. No decreased breath sounds, wheezing, rhonchi or rales.  Abdominal:     Palpations: Abdomen is soft.     Tenderness: There is no abdominal tenderness.  Musculoskeletal:     Left knee: Swelling present. Decreased range of motion.     Right ankle: No swelling.     Left ankle: No swelling.  Skin:    General: Skin is warm.     Comments: Numerous areas of darkened skin where patient states that he may have had spider bites previously.  Neurological:     Mental Status: He is alert and oriented to person,  place, and time.      Scheduled Meds:  asenapine  10 mg Sublingual QHS   atenolol  100 mg Oral Daily   atorvastatin  10 mg Oral QHS   cloZAPine  150 mg Oral QHS   colchicine  0.6 mg Oral Daily   divalproex  2,000 mg Oral QHS   docusate sodium  100 mg Oral BID   enoxaparin (LOVENOX) injection  40 mg Subcutaneous Q24H   insulin aspart  0-5 Units Subcutaneous QHS   insulin aspart  0-9 Units Subcutaneous TID WC   ipratropium-albuterol  3 mL Nebulization Q6H   levothyroxine  75 mcg Oral Q0600   meloxicam  15 mg Oral Daily   pantoprazole  40 mg Oral Daily   predniSONE  20 mg Oral Q breakfast   Continuous Infusions:  sodium chloride Stopped (09/01/20 1727)   cefTRIAXone (ROCEPHIN)  IV 2 g (09/02/20 0831)    Assessment/Plan:  Acute gout left knee and may also have gout in the right knee.  Left knee aspiration done today by Dr. Rudene Christians.  Started low-dose prednisone and will give 2 doses of colchicine today.  Add back Mobic. Sepsis, present on admission with fever of 100.6, tachycardia and right knee septic joint.  Continue Rocephin today and hopefully switch over to oral  antibiotics for tomorrow.  So far cultures show no growth.  GC in the urine negative.  Lyme test pending. Wheeze this has resolved with nebulizer treatments Sleep apnea with CIWA retention likely obesity hypoventilation syndrome.  Continue BiPAP at night 2417 Essential hypertension on atenolol Hyperlipidemia unspecified on atorvastatin Paranoid schizophrenia on clozapine, Depakote and asenapine Hypothyroidism unspecified on levothyroxine Weakness.  Physical therapy recommending rehab but hopefully if I get his knee pain better he will do better and be able to go back to his group home.        Code Status:     Code Status Orders  (From admission, onward)           Start     Ordered   08/29/20 1739  Full code  Continuous        08/29/20 1740           Code Status History     Date Active Date Inactive  Code Status Order ID Comments User Context   11/01/2016 2235 11/14/2016 1912 Full Code [id number]  Lance Coon, MD Inpatient   12/20/2015 1614 12/28/2015 1927 Full Code [id number]  Vilinda Boehringer, MD ED   11/25/2015 1037 11/28/2015 2134 Full Code [id number]  Robbie Lis, MD Inpatient      Family Communication: Spoke with patient's mother on the phone Disposition Plan: Status is: Inpatient  Dispo: The patient is from: [place]              Anticipated d/c is to: Group Home versus rehab pending improvement in his knees.              Patient currently being treated for acute gout and septic knee.   Difficult to place patient.  No.  Consultants: Orthopedic surgery  Antibiotics: Rocephin  Time spent: 26 minutes, case discussed with orthopedic surgery  [place]

## 2020-09-02 NOTE — Care Management Important Message (Signed)
Important Message  Patient Details  Name: John [name]. MRN: [id number] Date of Birth: July 21, 1975   Medicare Important Message Given:  Yes     Dannette Barbara 09/02/2020, 11:55 AM

## 2020-09-02 NOTE — Progress Notes (Signed)
The above named patient will need to go to short term rehabilitation expected to be less than 30 days, he will need strengthening and rehabilitation. The plan is to return to his home after rehab.

## 2020-09-02 NOTE — TOC Progression Note (Addendum)
Transition of Care (TOC) - Progression Note    Patient Details  Name: John [name]. MRN: [id number] Date of Birth: 01-07-1975  Transition of Care [facility]) CM/SW Contact  Su Hilt, RN Phone Number: 09/02/2020, 10:46 AM  Clinical Narrative:     Spoke with the patient's mother John [name].  She gives permission to do a bed search for STR, SNF, he has been to [facility] previously, PASSR pending, FL2 completed, Bedsearch sent   Uploaded clinical notes to [place] must for PASSR    Expected Discharge Plan and Services                                                 Social Determinants of Health (SDOH) Interventions    Readmission Risk Interventions No flowsheet data found.

## 2020-09-02 NOTE — NC FL2 (Signed)
[place] LEVEL OF CARE SCREENING TOOL     IDENTIFICATION  Patient Name: John [name]. Birthdate: 1975-02-05 Sex: male Admission Date (Current Location): 08/29/2020  [place] and [place] Number:  Engineering geologist and Address:  [facility], [address], [place], [place] [address]      Provider Number: [id number]  Attending Physician Name and Address:  Loletha Grayer, MD  Relative Name and Phone Number:  [name] Mother [phone]    Current Level of Care: Hospital Recommended Level of Care: [place] Prior Approval Number:    Date Approved/Denied:   PASRR Number: Pending  Discharge Plan: SNF    Current Diagnoses: Patient Active Problem List   Diagnosis Date Noted   Obesity hypoventilation syndrome ([place])    Hyperlipidemia    Arthritis, septic, knee ([place]) 08/30/2020   Septic joint ([place]) 08/29/2020   Community acquired pneumonia 08/29/2020   Bronchitis 08/23/2020   Idiopathic sleep related nonobstructive alveolar hypoventilation 08/23/2020   Sore throat 08/23/2020   OSA treated with BiPAP 08/08/2020   Obesity (BMI 30-39.9) 08/08/2020   OSA on CPAP 05/23/2020   CPAP use counseling 05/23/2020   BMI 36.0-36.9,adult 05/23/2020   Bilateral leg weakness 08/17/2019   Knee arthropathy 08/10/2019   Acute gout due to renal impairment involving right foot 07/08/2019   Encephalopathy acute    Somnolence 11/01/2016   AKI (acute kidney injury) ([place]) 11/01/2016   Sepsis ([place]) 04/27/2016   Respiratory failure ([place]) 12/20/2015   Myocardiopathy ([place]) 11/28/2015   Tobacco abuse 11/28/2015   Acute respiratory failure with hypercapnia ([name])    Sleep apnea    Essential hypertension 11/25/2015   Hypothyroidism 11/25/2015   Paranoid schizophrenia, chronic condition ([place]) 11/25/2015   Acute respiratory failure ([name])    Acute exacerbation of COPD with asthma ([place])    Diabetes mellitus ([place])    Diabetes mellitus due to  underlying condition without complication, without long-term current use of insulin ([name])    Morbid obesity ([place]) 09/29/2008    Orientation RESPIRATION BLADDER Height & Weight     Self, Time, Situation, Place  Normal Continent Weight: 131.5 kg Height:  '6\' 1"'$  (185.4 cm)  BEHAVIORAL SYMPTOMS/MOOD NEUROLOGICAL BOWEL NUTRITION STATUS      Continent Diet (regular)  AMBULATORY STATUS COMMUNICATION OF NEEDS Skin   Limited Assist Verbally Normal, Surgical wounds                       Personal Care Assistance Level of Assistance  Dressing, Bathing Bathing Assistance: Limited assistance   Dressing Assistance: Limited assistance     Functional Limitations Info             SPECIAL CARE FACTORS FREQUENCY  PT (By licensed PT)     PT Frequency: 5 times per week              Contractures Contractures Info: Not present    Additional Factors Info  Code Status, Allergies Code Status Info: full code Allergies Info: NKDA           Current Medications (09/02/2020):  This is the current hospital active medication list Current Facility-Administered Medications  Medication Dose Route Frequency Provider Last Rate Last Admin   0.9 %  sodium chloride infusion   Intravenous PRN Loletha Grayer, MD   Stopped at 09/01/20 1727   asenapine (SAPHRIS) sublingual tablet 10 mg  10 mg Sublingual QHS [name], MD   10 mg at 09/01/20 2228  atenolol (TENORMIN) tablet 100 mg  100 mg Oral Daily [name], MD   100 mg at 09/02/20 0954   atorvastatin (LIPITOR) tablet 10 mg  10 mg Oral QHS [name], MD   10 mg at 09/01/20 2221   cefTRIAXone (ROCEPHIN) 2 g in sodium chloride 0.9 % 100 mL IVPB  2 g Intravenous Q24H Loletha Grayer, MD 200 mL/hr at 09/02/20 0831 2 g at 09/02/20 0831   cloZAPine (CLOZARIL) tablet 150 mg  150 mg Oral QHS Loletha Grayer, MD   150 mg at 09/01/20 2227   colchicine tablet 0.6 mg  0.6 mg Oral Daily Loletha Grayer, MD       divalproex (DEPAKOTE ER) 24 hr  tablet 2,000 mg  2,000 mg Oral QHS [name], MD   2,000 mg at 09/01/20 2222   docusate sodium (COLACE) capsule 100 mg  100 mg Oral BID [name], MD   100 mg at 09/02/20 0954   enoxaparin (LOVENOX) injection 40 mg  40 mg Subcutaneous Q24H [name], MD   40 mg at 09/02/20 0954   insulin aspart (novoLOG) injection 0-5 Units  0-5 Units Subcutaneous QHS Wieting, Richard, MD       insulin aspart (novoLOG) injection 0-9 Units  0-9 Units Subcutaneous TID WC Loletha Grayer, MD   2 Units at 09/01/20 1703   ipratropium-albuterol (DUONEB) 0.5-2.5 (3) MG/3ML nebulizer solution 3 mL  3 mL Nebulization Q6H Loletha Grayer, MD   3 mL at 09/02/20 0748   levothyroxine (SYNTHROID) tablet 75 mcg  75 mcg Oral Q0600 [name], MD   75 mcg at 09/02/20 0626   ondansetron (ZOFRAN) tablet 4 mg  4 mg Oral Q6H PRN [name], MD   4 mg at 08/30/20 [id number]   Or   ondansetron (ZOFRAN) injection 4 mg  4 mg Intravenous Q6H PRN [name], MD       ondansetron [facility]) tablet 4 mg  4 mg Oral Q6H PRN [name], MD       Or   ondansetron [facility]) injection 4 mg  4 mg Intravenous Q6H PRN [name], MD       pantoprazole (PROTONIX) EC tablet 40 mg  40 mg Oral Daily [name], MD   40 mg at 09/02/20 0954   polyethylene glycol (MIRALAX / GLYCOLAX) packet 17 g  17 g Oral Daily PRN [name], MD   17 g at 09/01/20 0946   predniSONE (DELTASONE) tablet 20 mg  20 mg Oral Q breakfast Loletha Grayer, MD       vancomycin [facility]) IVPB 1750 mg/350 mL  1,750 mg Intravenous Q12H Darnelle Bos, RPH 175 mL/hr at 09/02/20 [id number] 1,750 mg at 09/02/20 [id number]     Discharge Medications: Please see discharge summary for a list of discharge medications.  Relevant Imaging Results:  Relevant Lab Results:   Additional Information SS# [id number] Covid vacine 2, no boosters  Su Hilt, RN

## 2020-09-03 LAB — GLUCOSE, CAPILLARY
Glucose-Capillary: 215 mg/dL — ABNORMAL HIGH (ref 70–99)
Glucose-Capillary: 243 mg/dL — ABNORMAL HIGH (ref 70–99)
Glucose-Capillary: 261 mg/dL — ABNORMAL HIGH (ref 70–99)

## 2020-09-03 LAB — CREATININE, SERUM
Creatinine, Ser: 0.6 mg/dL — ABNORMAL LOW (ref 0.61–1.24)
GFR, Estimated: >60 mL/min (ref >60)

## 2020-09-03 MED ORDER — IPRATROPIUM-ALBUTEROL 0.5-2.5 (3) MG/3ML IN SOLN: 3.0000 mL | Freq: Four times a day (QID) | RESPIRATORY_TRACT | Status: DC | PRN

## 2020-09-03 MED ORDER — ACETAMINOPHEN 500 MG PO TABS: 500.0000 mg | ORAL_TABLET | Freq: Three times a day (TID) | ORAL | 0 refills | Status: DC | PRN

## 2020-09-03 MED ORDER — DOXYCYCLINE HYCLATE 100 MG PO TABS: 100.0000 mg | ORAL_TABLET | Freq: Two times a day (BID) | ORAL | 0 refills | Status: DC

## 2020-09-03 MED ORDER — ASENAPINE MALEATE 5 MG SL SUBL: 10.0000 mg | SUBLINGUAL_TABLET | Freq: Every day | SUBLINGUAL | Status: DC

## 2020-09-03 MED ORDER — COLCHICINE 0.6 MG PO TABS: 0.6000 mg | ORAL_TABLET | Freq: Every day | ORAL | 1 refills | Status: DC

## 2020-09-03 MED ORDER — PREDNISONE 10 MG PO TABS: ORAL_TABLET | ORAL | 0 refills | Status: DC

## 2020-09-03 MED ORDER — COLCHICINE 0.6 MG PO TABS
0.6000 mg | ORAL_TABLET | Freq: Once | ORAL | Status: AC
  Administered 2020-09-03: 0.6 mg via ORAL
  Filled 2020-09-03: qty 1

## 2020-09-03 MED ORDER — DOCUSATE SODIUM 100 MG PO CAPS
100.0000 mg | ORAL_CAPSULE | Freq: Two times a day (BID) | ORAL | 0 refills | Status: DC | PRN
Start: 2020-09-03 — End: 2021-01-18

## 2020-09-03 MED ORDER — DOXYCYCLINE HYCLATE 100 MG PO TABS
100.0000 mg | ORAL_TABLET | Freq: Two times a day (BID) | ORAL | Status: DC
  Administered 2020-09-03: 100 mg via ORAL
  Filled 2020-09-03: qty 1

## 2020-09-03 NOTE — Progress Notes (Signed)
Physical Therapy Treatment Patient Details Name: John [name]. MRN: [id number] DOB: 1975-05-18 Today's Date: 09/03/2020    History of Present Illness John [name]. is a 45 y.o. male with medical history significant for schizophrenia, obesity, OSA, noncompliant with BiPAP, hypertension, hyperlipidemia, asthma, presents emergency department from group home with staff for chief concerns of weakness.     He reports that he has been feeling weak for about 2 weeks.  He states that he fell on his knees about 2 weeks ago.  He reports that the right knee was hurting first and then the left knee. Patient is s/p I &D and Right lateral partial meniscus repair 08/30/20.    PT Comments    Pt did much better with PT/ambulation this date and though he is still having some pain in the L (non-surgical) knee it is much better than previous sessions.  Through he was able to ambulate w/o AD with significant limp he was much safer and more efficient with AD and we discussed likely using the walker for at least another week (per/with HHPT) before trying to get around too much w/o it.  Pt was able to use the commode, get cleaned up and rise to standing w/o PT assist or intervention - appears appropriate to return to group home with walker.  Follow Up Recommendations  SNF;Supervision for mobility/OOB     Equipment Recommendations  Rolling walker with 5" wheels    Recommendations for Other Services       Precautions / Restrictions Precautions Precautions: Fall Restrictions RLE Weight Bearing: Weight bearing as tolerated    Mobility  Bed Mobility Overal bed mobility: Modified Independent Bed Mobility: Supine to Sit     Supine to sit: Supervision     General bed mobility comments: good effort with appropriate UE use and time to get to sittin    Transfers Overall transfer level: Modified independent Equipment used: Rolling walker (2 wheeled) Transfers: Sit to/from Stand Sit to Stand:  Supervision         General transfer comment: able to rise to standing multiple times from multiple surfaces t/o session, definite reliance on UEs but no phyiscal assist needed  Ambulation/Gait Ambulation/Gait assistance: Supervision Gait Distance (Feet): 75 Feet Assistive device: Rolling walker (2 wheeled);None       General Gait Details: Pt did much better with ambulation today, using walker he was able to easily get around the room and do some hallway ambulaiton.  He was also able to do ~10 ft with single UE use of hallway rail and then ~10 ft w/o UEs/AD but each of these had increasing limp with decreasing speed and safety.  Encouraged him to continue with the walker for now.   Stairs             Wheelchair Mobility    Modified Rankin (Stroke Patients Only)       Balance Overall balance assessment: Needs assistance Sitting-balance support: Feet supported Sitting balance-Leahy Scale: Normal     Standing balance support: Bilateral upper extremity supported;During functional activity Standing balance-Leahy Scale: Good Standing balance comment: good with UEs, fair w/o AD/UEs                            Cognition Arousal/Alertness: Awake/alert Behavior During Therapy: WFL for tasks assessed/performed Overall Cognitive Status: History of cognitive impairments - at baseline  Exercises      General Comments General comments (skin integrity, edema, etc.): Pt showed great effort and with walker is able to walk appropriate distances for in-home activity.      Pertinent Vitals/Pain Pain Assessment: 0-10 Pain Score: 4  Pain Location: L knee gout more than surgical R    Home Living                      Prior Function            PT Goals (current goals can now be found in the care plan section) Progress towards PT goals: Progressing toward goals    Frequency    Min 2X/week       PT Plan Current plan remains appropriate    Co-evaluation              AM-PAC PT "6 Clicks" Mobility   Outcome Measure  Help needed turning from your back to your side while in a flat bed without using bedrails?: None Help needed moving from lying on your back to sitting on the side of a flat bed without using bedrails?: None Help needed moving to and from a bed to a chair (including a wheelchair)?: None Help needed standing up from a chair using your arms (e.g., wheelchair or bedside chair)?: None Help needed to walk in hospital room?: A Little Help needed climbing 3-5 steps with a railing? : A Little 6 Click Score: 22    End of Session Equipment Utilized During Treatment: Gait belt Activity Tolerance: Patient tolerated treatment well;Patient limited by pain Patient left: with chair alarm set;with call bell/phone within reach Nurse Communication: Mobility status PT Visit Diagnosis: Unsteadiness on feet (R26.81);Other abnormalities of gait and mobility (R26.89);Muscle weakness (generalized) (M62.81);Pain Pain - Right/Left: Left Pain - part of body: Knee     Time: 0900-0940 PT Time Calculation (min) (ACUTE ONLY): 40 min  Charges:  $Gait Training: 23-37 mins $Therapeutic Activity: 8-22 mins                     Kreg Shropshire, DPT 09/03/2020, 1:36 PM

## 2020-09-03 NOTE — Discharge Summary (Signed)
[facility] NAME: John [name]    MR#:  [id number]  DATE OF BIRTH:  25-Oct-1975  DATE OF ADMISSION:  08/29/2020 ADMITTING PHYSICIAN: Little Ishikawa, MD  DATE OF DISCHARGE: 09/03/2020  PRIMARY CARE PHYSICIAN: Cletis Athens, MD    ADMISSION DIAGNOSIS:  Mild intermittent asthma with exacerbation [J45.21] Arthritis, septic, knee ([name]) [M00.9] Generalized weakness [R53.1] Septic joint ([place]) [M00.9] Pyogenic arthritis of right knee joint, due to unspecified organism ([place]) [M00.9] Community acquired pneumonia of right lower lobe of lung [J18.9] Infection of left knee ([place]) [M00.9]  DISCHARGE DIAGNOSIS:  Principal Problem:   Septic joint ([place]) Active Problems:   Morbid obesity ([place])   Essential hypertension   Hypothyroidism   Paranoid schizophrenia, chronic condition ([place])   Tobacco abuse   Knee arthropathy   Bilateral leg weakness   OSA on CPAP   OSA treated with BiPAP   Bronchitis   Community acquired pneumonia   Arthritis, septic, knee ([place])   Hyperlipidemia   Obesity hypoventilation syndrome ([place])   Acute gout of left knee   SECONDARY DIAGNOSIS:   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 tophus 07/20/2019   Morbid obesity ([place])    Paranoid schizophrenia ([place])    Personality disorder ([place])    Sleep apnea    Thyroid disease     HOSPITAL COURSE:   Acute gout bilateral knees.  Initially right knee was aspirated and suspected infection but it did have monosodium urate crystals.  Patient was having more left knee pain and was aspirated by Dr. Rudene Christians and also had monosodium urate crystals.  Treatment for gout was done with colchicine twice daily and low-dose prednisone.  I will prescribe a prednisone taper and colchicine on a daily basis.  Recommend switching over to allopurinol as outpatient. Sepsis, present on admission with fever  of 100.6, tachycardia and right knee septic joint.  The patient was brought to the operating room for right knee cleanout by Dr. Rudene Christians on 08/30/2020.  Cultures showed no growth.  Patient had GC screen from urine that was negative.  Lyme titer and Western blot pending.  Patient received initially Rocephin and vancomycin.  Switched over to doxycycline for 1 more week.  Avoid direct sunlight while on doxycycline and 1 week after. Sleep apnea with CO2 retention from obesity hypoventilation syndrome.  Continue BiPAP at night with settings of 24/17 Whee[place] during the hospital course.  This has resolved with nebulizer treatments Essential hypertension on atenolol Hyperlipidemia unspecified on atorvastatin Paranoid schizophrenia on clozapine, Depakote and asenapine Hypothyroidism unspecified on levothyroxine Weakness.  Did better with physical therapy.  Able to go back to the group home with home health. Obesity with a BMI of 38.6  DISCHARGE CONDITIONS:   Satisfactory  CONSULTS OBTAINED:  Treatment Team:  [name], MD  DRUG ALLERGIES:  No Known Allergies  DISCHARGE MEDICATIONS:   Allergies as of 09/03/2020   No Known Allergies      Medication List     STOP taking these medications    diphenhydrAMINE 25 MG tablet Commonly known as: BENADRYL   levofloxacin 500 MG tablet Commonly known as: LEVAQUIN   meloxicam 15 MG tablet Commonly known as: MOBIC   oxybutynin 5 MG tablet Commonly known as: DITROPAN   sitaGLIPtin 100 MG tablet Commonly known as: Januvia       TAKE these  medications    Accu-Chek Softclix Lancets lancets Use as instructed   acetaminophen 500 MG tablet Commonly known as: TYLENOL Take 1 tablet (500 mg total) by mouth every 8 (eight) hours as needed for mild pain or moderate pain.   albuterol (2.5 MG/3ML) 0.083% nebulizer solution Commonly known as: PROVENTIL USE 1 VIAL VIA NEBULIZER 3 TIMES A DAY   Allergy Relief 10 MG tablet Generic drug:  loratadine TAKE ONE TABLET BY MOUTH EVERY DAY   asenapine 5 MG Subl 24 hr tablet Commonly known as: SAPHRIS Place 2 tablets (10 mg total) under the tongue at bedtime.   atenolol 100 MG tablet Commonly known as: TENORMIN Take 1 tablet (100 mg total) by mouth daily.   atorvastatin 10 MG tablet Commonly known as: LIPITOR TAKE 1 TABLET BY MOUTH DAILY   B-D SINGLE USE SWABS REGULAR Pads USE AS DIRECTED FOR BLOOD SUGAR CHECKS.   cloZAPine 100 MG tablet Commonly known as: CLOZARIL Take 150 mg by mouth at bedtime.   colchicine 0.6 MG tablet Take 1 tablet (0.6 mg total) by mouth daily. Start taking on: September 04, 2020   divalproex 500 MG 24 hr tablet Commonly known as: DEPAKOTE ER Take 2,000 mg by mouth at bedtime.   docusate sodium 100 MG capsule Commonly known as: COLACE Take 1 capsule (100 mg total) by mouth 2 (two) times daily as needed for mild constipation.   doxycycline 100 MG tablet Commonly known as: VIBRA-TABS Take 1 tablet (100 mg total) by mouth every 12 (twelve) hours.   levothyroxine 75 MCG tablet Commonly known as: SYNTHROID TAKE 1 TABLET BY MOUTH EVERY DAY ON AN EMPTY STOMACH   metFORMIN 1000 MG tablet Commonly known as: GLUCOPHAGE TAKE 1 TABLET BY MOUTH TWICE A DAY WITH FOOD   omeprazole 20 MG capsule Commonly known as: PRILOSEC TAKE 1 CAPSULE BY MOUTH DAILY   Ozempic (0.25 or 0.5 MG/DOSE) 2 MG/1.5ML Sopn Generic drug: Semaglutide(0.25 or 0.'5MG'$ /DOS) Inject 0.5 mg into the skin once a week.   predniSONE 10 MG tablet Commonly known as: DELTASONE 2 tabs daily for 1 day, then 1 tab daily for 3 days, then 1/2 daily for 4 days   True Metrix Blood Glucose Test test strip Generic drug: glucose blood FINGERSTICK BLOOD SUGAR ONCE DAILY               Durable Medical Equipment  (From admission, onward)           Start     Ordered   09/03/20 0943  For home use only DME Walker rolling  Once       Question Answer Comment  Walker: With [place]  Wheels   Patient needs a walker to treat with the following condition Gout attack      09/03/20 0942             DISCHARGE INSTRUCTIONS:   Follow-up PMD 5 days Follow-up Dr. Rudene Christians orthopedic surgery 1 to 2 weeks  If you experience worsening of your admission symptoms, develop shortness of breath, life threatening emergency, suicidal or homicidal thoughts you must seek medical attention immediately by calling 911 or calling your MD immediately  if symptoms less severe.  You Must read complete instructions/literature along with all the possible adverse reactions/side effects for all the Medicines you take and that have been prescribed to you. Take any new Medicines after you have completely understood and accept all the possible adverse reactions/side effects.   Please note  You were cared for by  a hospitalist during your hospital stay. If you have any questions about your discharge medications or the care you received while you were in the hospital after you are discharged, you can call the unit and asked to speak with the hospitalist on call if the hospitalist that took care of you is not available. Once you are discharged, your primary care physician will handle any further medical issues. Please note that NO REFILLS for any discharge medications will be authorized once you are discharged, as it is imperative that you return to your primary care physician (or establish a relationship with a primary care physician if you do not have one) for your aftercare needs so that they can reassess your need for medications and monitor your lab values.    Today   CHIEF COMPLAINT:   Chief Complaint  Patient presents with   Weakness    HISTORY OF PRESENT ILLNESS:  John [name]  is a 45 y.o. male came in with weakness and knee pain   VITAL SIGNS:  Blood pressure 112/85, pulse 76, temperature 98.4 F (36.9 C), temperature source Oral, resp. rate 18, height '6\' 1"'$  (1.854 m), weight 131.5 kg,  SpO2 96 %.  I/O:   Intake/Output Summary (Last 24 hours) at 09/03/2020 1308 Last data filed at 09/03/2020 1030 Gross per 24 hour  Intake 600 ml  Output 1200 ml  Net -600 ml    PHYSICAL EXAMINATION:  GENERAL:  45 y.o.-year-old patient lying in the bed with no acute distress.  EYES: Pupils equal, round, reactive to light and accommodation. No scleral icterus.  HEENT: Head atraumatic, normocephalic. Oropharynx and nasopharynx clear.  LUNGS: Normal breath sounds bilaterally, no wheezing, rales,rhonchi or crepitation. No use of accessory muscles of respiration.  CARDIOVASCULAR: S1, S2 normal. No murmurs, rubs, or gallops.  ABDOMEN: Soft, non-tender, non-distended.  EXTREMITIES: Better range of motion left knee with less pain than yesterday. NEUROLOGIC: Cranial nerves II through XII are intact. Muscle strength 5/5 in all extremities. Sensation intact. Gait not checked.  PSYCHIATRIC: The patient is alert and answers questions appropriately.  SKIN: Darkened areas of skin on thigh.  Secondary excoriations on abdomen.  DATA REVIEW:   CBC Recent Labs  Lab 08/30/20 0538  WBC 9.7  HGB 11.1*  HCT 36.5*  PLT 241    Chemistries  Recent Labs  Lab 08/30/20 0538 09/01/20 0853 09/03/20 0542  NA 138  --   --   K 4.4  --   --   CL 99  --   --   CO2 32  --   --   GLUCOSE 157*  --   --   BUN 10  --   --   CREATININE 0.76   < > 0.60*  CALCIUM 8.7*  --   --    < > = values in this interval not displayed.     Microbiology Results  Results for orders placed or performed during the hospital encounter of 08/29/20  Resp Panel by RT-PCR (Flu A&B, Covid) Nasopharyngeal Swab     Status: None   Collection Time: 08/29/20  3:19 PM   Specimen: Nasopharyngeal Swab; Nasopharyngeal(NP) swabs in vial transport medium  Result Value Ref Range Status   SARS Coronavirus 2 by RT PCR NEGATIVE NEGATIVE Final    Comment: (NOTE) SARS-CoV-2 target nucleic acids are NOT DETECTED.  The SARS-CoV-2 RNA is  generally detectable in upper respiratory specimens during the acute phase of infection. The lowest concentration of SARS-CoV-2 viral copies this assay can  detect is 138 copies/mL. A negative result does not preclude SARS-Cov-2 infection and should not be used as the sole basis for treatment or other patient management decisions. A negative result may occur with  improper specimen collection/handling, submission of specimen other than nasopharyngeal swab, presence of viral mutation(s) within the areas targeted by this assay, and inadequate number of viral copies(<138 copies/mL). A negative result must be combined with clinical observations, patient history, and epidemiological information. The expected result is Negative.  Fact Sheet for Patients:  EntrepreneurPulse.com.au  Fact Sheet for Healthcare Providers:  IncredibleEmployment.be  This test is no t yet approved or cleared by the [place] FDA and  has been authorized for detection and/or diagnosis of SARS-CoV-2 by FDA under an Emergency Use Authorization (EUA). This EUA will remain  in effect (meaning this test can be used) for the duration of the COVID-19 declaration under Section 564(b)(1) of the Act, 21 U.S.C.section 360bbb-3(b)(1), unless the authorization is terminated  or revoked sooner.       Influenza A by PCR NEGATIVE NEGATIVE Final   Influenza B by PCR NEGATIVE NEGATIVE Final    Comment: (NOTE) The Xpert Xpress SARS-CoV-2/FLU/RSV plus assay is intended as an aid in the diagnosis of influenza from Nasopharyngeal swab specimens and should not be used as a sole basis for treatment. Nasal washings and aspirates are unacceptable for Xpert Xpress SARS-CoV-2/FLU/RSV testing.  Fact Sheet for Patients: EntrepreneurPulse.com.au  Fact Sheet for Healthcare Providers: IncredibleEmployment.be  This test is not yet approved or cleared by the [place] FDA and has been authorized for detection and/or diagnosis of SARS-CoV-2 by FDA under an Emergency Use Authorization (EUA). This EUA will remain in effect (meaning this test can be used) for the duration of the COVID-19 declaration under Section 564(b)(1) of the Act, 21 U.S.C. section 360bbb-3(b)(1), unless the authorization is terminated or revoked.  Performed at [facility], [address] [place], [place] [address]   Body fluid culture w Gram Stain     Status: None   Collection Time: 08/29/20  3:19 PM   Specimen: Synovium; Body Fluid  Result Value Ref Range Status   Specimen Description   Final    SYNOVIAL Performed at [facility], [address] [place], [place] [address]    Special Requests   Final    RIGHT KNEE Performed at [facility], [address] [place], [place] [address]    Gram Stain   Final    ABUNDANT WBC PRESENT,BOTH PMN AND MONONUCLEAR NO ORGANISMS SEEN    Culture   Final    NO GROWTH 3 DAYS Performed at [facility], [place] [address] [place], [place] [address]    Report Status 09/02/2020 FINAL  Final  Chlamydia/NGC rt PCR ([place] only)     Status: None   Collection Time: 08/31/20  6:39 PM   Specimen: Urine  Result Value Ref Range Status   Specimen source GC/Chlam URINE, RANDOM  Final   Chlamydia Tr NOT DETECTED NOT DETECTED Final   N gonorrhoeae NOT DETECTED NOT DETECTED Final    Comment: (NOTE) This CT/NG assay has not been evaluated in patients with a history of  hysterectomy. Performed at [facility], [address] [place], [place] [address]   Body fluid culture w Gram Stain     Status: None (Preliminary result)   Collection Time: 09/02/20 11:54 AM   Specimen: KNEE; Synovial Fluid  Result Value Ref Range Status   Specimen Description  Final    KNEE JOINT Performed at [facility], [address] [place], [place] [address]    Special Requests   Final    Normal Performed  at [facility], [address] [place], [place] [address]    Gram Stain PENDING  Incomplete   Culture   Final    NO GROWTH < 24 HOURS Performed at [facility], [place] [address] [place], [place] [address]    Report Status PENDING  Incomplete      Management plans discussed with the patient, family and they are in agreement.  CODE STATUS:     Code Status Orders  (From admission, onward)           Start     Ordered   08/29/20 1739  Full code  Continuous        08/29/20 1740           Code Status History     Date Active Date Inactive Code Status Order ID Comments User Context   11/01/2016 2235 11/14/2016 1912 Full Code [id number]  Lance Coon, MD Inpatient   12/20/2015 1614 12/28/2015 1927 Full Code [id number]  Vilinda Boehringer, MD ED   11/25/2015 1037 11/28/2015 2134 Full Code [id number]  Robbie Lis, MD Inpatient       TOTAL TIME TAKING CARE OF THIS PATIENT: 32 minutes.    Loletha Grayer M.D on 09/03/2020 at 1:08 PM  [facility]  CC: Primary care physician; Cletis Athens, MD

## 2020-09-03 NOTE — TOC Progression Note (Addendum)
Transition of Care (TOC) - Progression Note    Patient Details  Name: John [name]. MRN: [id number] Date of Birth: 1975-08-11  Transition of Care [facility]) CM/SW [place], LCSW Phone Number: 09/03/2020, 9:39 AM  Clinical Narrative:   Notified by MD that patient has improved, walkingin the hall with PT. [facility] to see if patient can return there using a RW. Spoke to [place] who is going to check with her boss and call CSW back. [name] stated they have a RW there that patient can use so one does not need to be ordered.         Expected Discharge Plan and Services                                                 Social Determinants of Health (SDOH) Interventions    Readmission Risk Interventions No flowsheet data found.

## 2020-09-03 NOTE — Progress Notes (Signed)
   Subjective: 4 Days Post-Op Procedure(s) (LRB): ARTHROSCOPY KNEE- I&D, PARTIAL LATERAL MENISCECTOMY (Right) Patient reports right knee pain as mild and stiff.  Overall swelling and pain improving.  Yesterday developed left knee pain.  Left knee aspiration performed showing monosodium urate crystals.  Patient was given gout medication and today denies any pain in the left knee. Patient denies any other complaints We will continue therapy today.   Objective: Vital signs in last 24 hours: Temp:  [97.8 F (36.6 C)-98.7 F (37.1 C)] 98.2 F (36.8 C) (09/03 0422) Pulse Rate:  [83-105] 83 (09/03 0422) Resp:  [15-20] 20 (09/03 0422) BP: (119-145)/(79-84) 119/84 (09/03 0422) SpO2:  [93 %-99 %] 96 % (09/03 0741) FiO2 (%):  [21 %] 21 % (09/02 1317)  Intake/Output from previous day: 09/02 0701 - 09/03 0700 In: 720 [P.O.:720] Out: 850 [Urine:850] Intake/Output this shift: No intake/output data recorded.  No results for input(s): HGB in the last 72 hours.  No results for input(s): WBC, RBC, HCT, PLT in the last 72 hours.  Recent Labs    09/02/20 0419 09/03/20 0542  CREATININE 0.68 0.60*   No results for input(s): LABPT, INR in the last 72 hours.  EXAM General - Patient is Alert, Appropriate, and Oriented Extremity - Neurovascular intact Sensation intact distally Intact pulses distally Dorsiflexion/Plantar flexion intact No cellulitis present Compartment soft Dressing - dressing C/D/I and no drainage, dressing removed and new dressing applied Motor Function - intact, moving foot and toes well on exam.   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 tophus 07/20/2019   Morbid obesity ([place])    Paranoid schizophrenia ([place])    Personality disorder ([place])    Sleep apnea    Thyroid disease     Assessment/Plan:   4 Days Post-Op Procedure(s) (LRB): ARTHROSCOPY KNEE- I&D, PARTIAL LATERAL  MENISCECTOMY (Right) Principal Problem:   Septic joint ([place]) Active Problems:   Morbid obesity ([place])   Essential hypertension   Hypothyroidism   Paranoid schizophrenia, chronic condition ([place])   Tobacco abuse   Knee arthropathy   Bilateral leg weakness   OSA on CPAP   OSA treated with BiPAP   Bronchitis   Community acquired pneumonia   Arthritis, septic, knee ([place])   Hyperlipidemia   Obesity hypoventilation syndrome ([place])   Acute gout of left knee  Estimated body mass index is 38.26 kg/m as calculated from the following:   Height as of this encounter: '6\' 1"'$  (1.854 m).   Weight as of this encounter: 131.5 kg. Advance diet Up with therapy Continue with antibiotics Right knee cultures/sensitivities pending. Currently no growth.  Left knee cultures pending CM to assist with discharge to group home today pending safe ambulation with physical therapy.  If still not ambulating safely may need to wait on skilled nursing facility.   Follow up with [facility] ortho first of next week for recheck Keep incision sites clean dry and covered   DVT Prophylaxis - Lovenox and TED hose Weight-Bearing as tolerated to right leg   T. Rachelle Hora, PA-C [place] 09/03/2020, 8:06 AM

## 2020-09-03 NOTE — Progress Notes (Signed)
Patient discharged with [name] from group home at 2040. Report and discharge paperwork provided. Patient stable upon discharge, mother informed of patients transport back to facility.

## 2020-09-03 NOTE — Discharge Instructions (Addendum)
Wear you Cpap at night every night  With Doxycycline- avoid the direct sunlight for 2 weeks

## 2020-09-03 NOTE — TOC Transition Note (Addendum)
Transition of Care [facility]) - CM/SW Discharge Note   Patient Details  Name: John [name]. MRN: [id number] Date of Birth: 01/30/1975  [facility] CM/SW Contact:  Magnus Ivan, LCSW Phone Number: 09/03/2020, 1:49 PM   Clinical Narrative:   Return call from [place] with [facility] who confirmed patient can come back there today. She will provide transport, RN will just need to call when patient is ready for pick up. [name] agreed to bring RW with her. She again says one does not need to be ordered for patient. She is aware HHPT is being arranged, no agency preference. Patient [facility] to [facility] for [facility] in the past. Referral accepted by Tommi Rumps with [facility] for HHPT services. SOC delayed to Wednesday, notified MD.    Final next level of care: [place] Barriers to Discharge: Barriers Resolved   Patient Goals and CMS Choice Patient states their goals for this hospitalization and ongoing recovery are:: back to group home CMS Medicare.gov Compare Post Acute Care list provided to:: Patient Represenative (must comment)    Discharge Placement                Patient to be transferred to facility by: [place] Name of family member notified: [name] Patient and family notified of of transfer: 09/03/20  Discharge Plan and Services                          HH Arranged: PT       Representative spoke with at [place]: TBD  Social Determinants of Health ([place]) Interventions     Readmission Risk Interventions No flowsheet data found.

## 2020-09-03 NOTE — Progress Notes (Signed)
Patient ready for discharge attempted to call [name] from group home [phone] and [phone]) multiple times for pick up. Voicemail is full. Patient's mother  John [name] was updated about delayed discharged and stated she would reach out to Group home director to arrange transportation.

## 2020-09-06 LAB — BODY FLUID CULTURE W GRAM STAIN
Culture: NO GROWTH
Special Requests: NORMAL

## 2020-09-07 ENCOUNTER — Encounter: Payer: Self-pay | Admitting: Orthopedic Surgery

## 2020-09-09 ENCOUNTER — Other Ambulatory Visit: Payer: Self-pay | Admitting: Internal Medicine

## 2020-09-12 LAB — LYME DISEASE, WESTERN BLOT
IgG P18 Ab.: ABSENT
IgG P23 Ab.: ABSENT
IgG P28 Ab.: ABSENT
IgG P30 Ab.: ABSENT
IgG P39 Ab.: ABSENT
IgG P66 Ab.: ABSENT
IgM P23 Ab.: ABSENT
IgM P39 Ab.: ABSENT
IgM P41 Ab.: ABSENT
Lyme IgG Wb: NEGATIVE
Lyme IgM Wb: NEGATIVE

## 2020-10-05 ENCOUNTER — Ambulatory Visit (INDEPENDENT_AMBULATORY_CARE_PROVIDER_SITE_OTHER): Payer: 59 | Admitting: Internal Medicine

## 2020-10-05 ENCOUNTER — Other Ambulatory Visit: Payer: Self-pay

## 2020-10-05 ENCOUNTER — Encounter: Payer: Self-pay | Admitting: Internal Medicine

## 2020-10-05 VITALS — BP 129/87 | HR 83 | Ht 73.0 in

## 2020-10-05 DIAGNOSIS — E119 Type 2 diabetes mellitus without complications: Secondary | ICD-10-CM | POA: Diagnosis not present

## 2020-10-05 DIAGNOSIS — F200 Paranoid schizophrenia: Secondary | ICD-10-CM

## 2020-10-05 DIAGNOSIS — G4734 Idiopathic sleep related nonobstructive alveolar hypoventilation: Secondary | ICD-10-CM | POA: Diagnosis not present

## 2020-10-05 DIAGNOSIS — E039 Hypothyroidism, unspecified: Secondary | ICD-10-CM

## 2020-10-05 DIAGNOSIS — I10 Essential (primary) hypertension: Secondary | ICD-10-CM | POA: Diagnosis not present

## 2020-10-05 DIAGNOSIS — M009 Pyogenic arthritis, unspecified: Secondary | ICD-10-CM

## 2020-10-05 LAB — GLUCOSE, POCT (MANUAL RESULT ENTRY): POC Glucose: 85 mg/dl (ref 70–99)

## 2020-10-05 NOTE — Progress Notes (Addendum)
Established Patient Office Visit  Subjective:  Patient ID: John [name]., male    DOB: 03-30-1975  Age: 45 y.o. MRN: [id number]  CC:  Chief Complaint  Patient presents with   Diabetes    Diabetes   [name]. presents for hospital follow up, patient was admitted with pyogenic arthritis of the knee.  He is known to have hypothyroidism: She is aphemia morbid obesity tobacco abuse he is using CPAP for obstructive sleep apnea.  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 tophus 07/20/2019   Morbid obesity ([place])    Paranoid schizophrenia ([place])    Personality disorder ([place])    Sleep apnea    Thyroid disease     Past Surgical History:  Procedure Laterality Date   FRACTURE SURGERY     KNEE ARTHROSCOPY Right 08/30/2020   Procedure: ARTHROSCOPY KNEE- I&D, PARTIAL LATERAL MENISCECTOMY;  Surgeon: [name], MD;  Location: [facility] ORS;  Service: Orthopedics;  Laterality: Right;    Family History  Problem Relation Age of Onset   Glaucoma Mother     Social History   Socioeconomic History   Marital status: Single    Spouse name: Not on file   Number of children: Not on file   Years of education: Not on file   Highest education level: Not on file  Occupational History   Not on file  Tobacco Use   Smoking status: Former   Smokeless tobacco: Never  Vaping Use   Vaping Use: Never used  Substance and Sexual Activity   Alcohol use: Not Currently   Drug use: No   Sexual activity: Not on file  Other Topics Concern   Not on file  Social History Narrative   Not on file   Social Determinants of Health   Financial Resource Strain: Low Risk    Difficulty of Paying Living Expenses: Not hard at all  Food Insecurity: No Food Insecurity   Worried About Charity fundraiser in the Last Year: Never true   [place] in the Last Year: Never true  Transportation Needs: No  Transportation Needs   Lack of Transportation (Medical): No   Lack of Transportation (Non-Medical): No  Physical Activity: Sufficiently Active   Days of Exercise per Week: 5 days   Minutes of Exercise per Session: 30 min  Stress: No Stress Concern Present   Feeling of Stress : Not at all  Social Connections: Socially Isolated   Frequency of Communication with Friends and Family: More than three times a week   Frequency of Social Gatherings with Friends and Family: More than three times a week   Attends Religious Services: Never   Marine scientist or Organizations: No   Attends Music therapist: Never   Marital Status: Never married  Human resources officer Violence: Not At Risk   Fear of Current or Ex-Partner: No   Emotionally Abused: No   Physically Abused: No   Sexually Abused: No     Current Outpatient Medications:    ACCU-CHEK GUIDE test strip, USE TO CHECK BLOOD SUGAR DAILY, Disp: 100 each, Rfl: 10   Accu-Chek Softclix Lancets lancets, Use as instructed, Disp: 100 each, Rfl: 12   acetaminophen (TYLENOL) 500 MG tablet, Take 1 tablet (500 mg total) by mouth every 8 (eight) hours as needed for mild pain or moderate pain., Disp:  20 tablet, Rfl: 0   albuterol (PROVENTIL) (2.5 MG/3ML) 0.083% nebulizer solution, USE 1 VIAL VIA NEBULIZER 3 TIMES A DAY, Disp: 300 mL, Rfl: 6   Alcohol Swabs (B-D SINGLE USE SWABS REGULAR) PADS, USE AS DIRECTED FOR BLOOD SUGAR CHECKS., Disp: 100 each, Rfl: 10   asenapine (SAPHRIS) 5 MG SUBL 24 hr tablet, Place 2 tablets (10 mg total) under the tongue at bedtime., Disp: , Rfl:    atenolol (TENORMIN) 100 MG tablet, TAKE 1 TABLET BY MOUTH DAILY, Disp: 30 tablet, Rfl: 3   atorvastatin (LIPITOR) 10 MG tablet, TAKE 1 TABLET BY MOUTH DAILY, Disp: 30 tablet, Rfl: 10   cloZAPine (CLOZARIL) 100 MG tablet, Take 150 mg by mouth at bedtime., Disp: , Rfl:    colchicine 0.6 MG tablet, Take 1 tablet (0.6 mg total) by mouth daily., Disp: 30 tablet, Rfl: 1    divalproex (DEPAKOTE ER) 500 MG 24 hr tablet, Take 2,000 mg by mouth at bedtime. , Disp: , Rfl:    docusate sodium (COLACE) 100 MG capsule, Take 1 capsule (100 mg total) by mouth 2 (two) times daily as needed for mild constipation., Disp: 60 capsule, Rfl: 0   doxycycline (VIBRA-TABS) 100 MG tablet, Take 1 tablet (100 mg total) by mouth every 12 (twelve) hours., Disp: 14 tablet, Rfl: 0   levothyroxine (SYNTHROID) 75 MCG tablet, TAKE 1 TABLET BY MOUTH EVERY DAY ON AN EMPTY STOMACH, Disp: 30 tablet, Rfl: 10   loratadine (CLARITIN) 10 MG tablet, TAKE 1 TABLET BY MOUTH DAILY, Disp: 30 tablet, Rfl: 10   metFORMIN (GLUCOPHAGE) 1000 MG tablet, TAKE 1 TABLET BY MOUTH TWICE A DAY WITH FOOD, Disp: 60 tablet, Rfl: 4   omeprazole (PRILOSEC) 20 MG capsule, TAKE 1 CAPSULE BY MOUTH DAILY, Disp: 30 capsule, Rfl: 6   predniSONE (DELTASONE) 10 MG tablet, 2 tabs daily for 1 day, then 1 tab daily for 3 days, then 1/2 daily for 4 days, Disp: 7 tablet, Rfl: 0   Semaglutide,0.25 or 0.5MG /DOS, (OZEMPIC, 0.25 OR 0.5 MG/DOSE,) 2 MG/1.5ML SOPN, Inject 0.5 mg into the skin once a week., Disp: 1.5 mL, Rfl: 10   No Known Allergies  ROS Review of Systems  Constitutional: Negative.   HENT: Negative.    Eyes: Negative.   Respiratory: Negative.    Cardiovascular: Negative.   Gastrointestinal: Negative.   Endocrine: Negative.   Genitourinary: Negative.   Musculoskeletal: Negative.   Skin: Negative.   Allergic/Immunologic: Negative.   Neurological: Negative.   Hematological: Negative.   Psychiatric/Behavioral: Negative.    All other systems reviewed and are negative.    Objective:    Physical Exam Vitals reviewed.  Constitutional:      Appearance: Normal appearance.  HENT:     Mouth/Throat:     Mouth: Mucous membranes are moist.  Eyes:     Pupils: Pupils are equal, round, and reactive to light.  Neck:     Vascular: No carotid bruit.  Cardiovascular:     Rate and Rhythm: Normal rate and regular rhythm.      Pulses: Normal pulses.     Heart sounds: Normal heart sounds.  Pulmonary:     Effort: Pulmonary effort is normal.     Breath sounds: Normal breath sounds.  Abdominal:     General: Bowel sounds are normal.     Palpations: Abdomen is soft. There is no hepatomegaly, splenomegaly or mass.     Tenderness: There is no abdominal tenderness.     Hernia: No hernia is present.  Musculoskeletal:     Cervical back: Neck supple.     Right lower leg: No edema.     Left lower leg: No edema.  Skin:    Findings: No rash.  Neurological:     Mental Status: He is alert and oriented to person, place, and time.     Motor: No weakness.  Psychiatric:        Mood and Affect: Mood normal.        Behavior: Behavior normal.    BP 129/87   Pulse 83   Ht 6\' 1"  (1.854 m)   BMI 38.26 kg/m  Wt Readings from Last 3 Encounters:  08/29/20 290 lb (131.5 kg)  08/23/20 282 lb 1.6 oz (128 kg)  08/08/20 288 lb 12.8 oz (131 kg)     Health Maintenance Due  Topic Date Due   FOOT EXAM  Never done   URINE MICROALBUMIN  Never done   Hepatitis C Screening  Never done   TETANUS/TDAP  06/24/2005   Pneumococcal Vaccine 67-74 Years old (2 - PCV) 04/28/2017   COVID-19 Vaccine (3 - Booster for Moderna series) 06/26/2019   INFLUENZA VACCINE  08/01/2020    There are no preventive care reminders to display for this patient.  Lab Results  Component Value Date   TSH 1.842 11/01/2016   Lab Results  Component Value Date   WBC 9.7 08/30/2020   HGB 11.1 (L) 08/30/2020   HCT 36.5 (L) 08/30/2020   MCV 85.1 08/30/2020   PLT 241 08/30/2020   Lab Results  Component Value Date   NA 138 08/30/2020   K 4.4 08/30/2020   CO2 32 08/30/2020   GLUCOSE 157 (H) 08/30/2020   BUN 10 08/30/2020   CREATININE 0.60 (L) 09/03/2020   BILITOT 0.9 12/21/2019   ALKPHOS 47 12/21/2019   AST 13 (L) 12/21/2019   ALT 9 12/21/2019   PROT 9.0 (H) 12/21/2019   ALBUMIN 4.0 12/21/2019   CALCIUM 8.7 (L) 08/30/2020   ANIONGAP 7 08/30/2020    Lab Results  Component Value Date   CHOL 146 11/24/2013   Lab Results  Component Value Date   HDL 22 (L) 11/24/2013   Lab Results  Component Value Date   LDLCALC 94 11/24/2013   Lab Results  Component Value Date   TRIG 152 11/24/2013   No results found for: CHOLHDL Lab Results  Component Value Date   HGBA1C 6.5 (H) 09/01/2020      Assessment & Plan:   Problem List Items Addressed This Visit       Cardiovascular and Mediastinum   Essential hypertension (Chronic)    The following hypertensive lifestyle modification were recommended and discussed:  1. Limiting alcohol intake to less than 1 oz/day of ethanol:(24 oz of beer or 8 oz of wine or 2 oz of 100-proof whiskey). 2. Take baby ASA 81 mg daily. 3. Importance of regular aerobic exercise and losing weight. 4. Reduce dietary saturated fat and cholesterol intake for overall cardiovascular health. 5. Maintaining adequate dietary potassium, calcium, and magnesium intake. 6. Regular monitoring of the blood pressure. 7. Reduce sodium intake to less than 100 mmol/day (less than 2.3 gm of sodium or less than 6 gm of sodium choride)         Respiratory   Sleep apnea    Patient was advised to continue using CPAP machine, patient has an idiopathic sleep-related nonobstructive alveolar hypoventilation        Endocrine   Hypothyroidism (Chronic)    He  was advised to take his thyroid medication in the morning before breakfast      Diabetes mellitus ([place]) - Primary    - The patient's blood sugar is labile on med. - The patient will continue the current treatment regimen.  - I encouraged the patient to regularly check blood sugar.  - I encouraged the patient to monitor diet. I encouraged the patient to eat low-carb and low-sugar to help prevent blood sugar spikes.  - I encouraged the patient to continue following their prescribed treatment plan for diabetes - I informed the patient to get help if blood sugar drops below  54mg /dL, or if suddenly have trouble thinking clearly or breathing.  Patient was advised to buy a book on diabetes from a local bookstore or from [place]  Patient should read 2 chapters every day to keep the motivation going, this is in addition to some of the materials we provided them from the office.  There are other resources on the Internet like YouTube and wilkipedia to get an education on the diabetes      Relevant Orders   POCT glucose (manual entry) (Completed)     Musculoskeletal and Integument   Septic joint ([place])    There is no evidence of infection in the right knee joint.  No tenderness   noted in the calf peripheral pulses are intact.        Other   Paranoid schizophrenia, chronic condition ([place]) (Chronic)    Stable at the present time       No orders of the defined types were placed in this encounter.   Follow-up: No follow-ups on file.    Cletis [place], MD

## 2020-10-05 NOTE — Assessment & Plan Note (Addendum)
There is no evidence of infection in the right knee joint.  No tenderness   noted in the calf peripheral pulses are intact.

## 2020-10-05 NOTE — Assessment & Plan Note (Signed)
Stable at the present time. 

## 2020-10-05 NOTE — Assessment & Plan Note (Signed)

## 2020-10-05 NOTE — Assessment & Plan Note (Addendum)
Patient was advised to continue using CPAP machine, patient has an idiopathic sleep-related nonobstructive alveolar hypoventilation

## 2020-10-05 NOTE — Assessment & Plan Note (Signed)
He was advised to take his thyroid medication in the morning before breakfast

## 2020-10-24 ENCOUNTER — Other Ambulatory Visit: Payer: Self-pay | Admitting: Internal Medicine

## 2020-10-27 ENCOUNTER — Ambulatory Visit (INDEPENDENT_AMBULATORY_CARE_PROVIDER_SITE_OTHER): Payer: 59 | Admitting: *Deleted

## 2020-10-27 DIAGNOSIS — E119 Type 2 diabetes mellitus without complications: Secondary | ICD-10-CM | POA: Diagnosis not present

## 2020-10-27 DIAGNOSIS — Z0000 Encounter for general adult medical examination without abnormal findings: Secondary | ICD-10-CM | POA: Diagnosis not present

## 2020-10-27 NOTE — Progress Notes (Signed)
Subjective:   John [name]. is a 45 y.o. male who presents for an Initial Medicare Annual Wellness Visit.  I discussed the limitations of evaluation and management by telemedicine and the availability of in person appointments. Patient expressed understanding and agreed to proceed.   Visit performed using audio  Patient:home Provider:home   Review of Systems    Defer to provider  Cardiac Risk Factors include: diabetes mellitus;obesity (BMI >30kg/m2);male gender     Objective:    There were no vitals filed for this visit. There is no height or weight on file to calculate BMI.  Advanced Directives 10/27/2020 08/30/2020 08/29/2020 12/29/2019 12/22/2019 12/21/2019 11/29/2019  Does Patient Have a Medical Advance Directive? Unable to assess, patient is non-responsive or altered mental status No No No No No No  Would patient like information on creating a medical advance directive? - No - Patient declined - No - Patient declined No - Patient declined - -    Current Medications (verified) Outpatient Encounter Medications as of 10/27/2020  Medication Sig   ACCU-CHEK GUIDE test strip USE TO CHECK BLOOD SUGAR DAILY   Accu-Chek Softclix Lancets lancets Use as instructed   acetaminophen (TYLENOL) 500 MG tablet Take 1 tablet (500 mg total) by mouth every 8 (eight) hours as needed for mild pain or moderate pain.   albuterol (PROVENTIL) (2.5 MG/3ML) 0.083% nebulizer solution USE 1 VIAL VIA NEBULIZER 3 TIMES A DAY   Alcohol Swabs (B-D SINGLE USE SWABS REGULAR) PADS USE AS DIRECTED FOR BLOOD SUGAR CHECKS.   asenapine (SAPHRIS) 5 MG SUBL 24 hr tablet Place 2 tablets (10 mg total) under the tongue at bedtime.   atenolol (TENORMIN) 100 MG tablet TAKE 1 TABLET BY MOUTH DAILY   atorvastatin (LIPITOR) 10 MG tablet TAKE 1 TABLET BY MOUTH DAILY   cloZAPine (CLOZARIL) 100 MG tablet Take 150 mg by mouth at bedtime.   colchicine 0.6 MG tablet Take 1 tablet (0.6 mg total) by mouth daily.   divalproex  (DEPAKOTE ER) 500 MG 24 hr tablet Take 2,000 mg by mouth at bedtime.    docusate sodium (COLACE) 100 MG capsule Take 1 capsule (100 mg total) by mouth 2 (two) times daily as needed for mild constipation.   doxycycline (VIBRA-TABS) 100 MG tablet Take 1 tablet (100 mg total) by mouth every 12 (twelve) hours.   levothyroxine (SYNTHROID) 75 MCG tablet TAKE 1 TABLET BY MOUTH EVERY DAY ON AN EMPTY STOMACH   loratadine (CLARITIN) 10 MG tablet TAKE 1 TABLET BY MOUTH DAILY   metFORMIN (GLUCOPHAGE) 1000 MG tablet TAKE 1 TABLET BY MOUTH TWICE A DAY WITH FOOD   omeprazole (PRILOSEC) 20 MG capsule TAKE 1 CAPSULE BY MOUTH DAILY   predniSONE (DELTASONE) 10 MG tablet 2 tabs daily for 1 day, then 1 tab daily for 3 days, then 1/2 daily for 4 days   Semaglutide,0.25 or 0.5MG/DOS, (OZEMPIC, 0.25 OR 0.5 MG/DOSE,) 2 MG/1.5ML SOPN Inject 0.5 mg into the skin once a week.   No facility-administered encounter medications on file as of 10/27/2020.    Allergies (verified) Patient has no known allergies.   History: 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 tophus 07/20/2019   Morbid obesity ([place])    Paranoid schizophrenia ([place])    Personality disorder ([place])    Sleep apnea    Thyroid disease    Past Surgical History:  Procedure  Laterality Date   FRACTURE SURGERY     KNEE ARTHROSCOPY Right 08/30/2020   Procedure: ARTHROSCOPY KNEE- I&D, PARTIAL LATERAL MENISCECTOMY;  Surgeon: [name], MD;  Location: [facility] ORS;  Service: Orthopedics;  Laterality: Right;   Family History  Problem Relation Age of Onset   Glaucoma Mother    Social History   Socioeconomic History   Marital status: Single    Spouse name: Not on file   Number of children: Not on file   Years of education: Not on file   Highest education level: Not on file  Occupational History   Not on file  Tobacco Use   Smoking status: Former   Smokeless tobacco:  Never  Vaping Use   Vaping Use: Never used  Substance and Sexual Activity   Alcohol use: Not Currently   Drug use: No   Sexual activity: Not on file  Other Topics Concern   Not on file  Social History Narrative   Not on file   Social Determinants of Health   Financial Resource Strain: Low Risk    Difficulty of Paying Living Expenses: Not hard at all  Food Insecurity: No Food Insecurity   Worried About Charity fundraiser in the Last Year: Never true   [place] in the Last Year: Never true  Transportation Needs: No Transportation Needs   Lack of Transportation (Medical): No   Lack of Transportation (Non-Medical): No  Physical Activity: Sufficiently Active   Days of Exercise per Week: 5 days   Minutes of Exercise per Session: 30 min  Stress: No Stress Concern Present   Feeling of Stress : Not at all  Social Connections: Socially Isolated   Frequency of Communication with Friends and Family: More than three times a week   Frequency of Social Gatherings with Friends and Family: More than three times a week   Attends Religious Services: Never   Marine scientist or Organizations: No   Attends Music therapist: Never   Marital Status: Never married    Tobacco Counseling Counseling given: Not Answered   Clinical Intake:  Pre-visit preparation completed: Yes  Pain : No/denies pain     Nutritional Risks: None Diabetes: Yes CBG done?: No Did pt. bring in CBG monitor from home?: No  How often do you need to have someone help you when you read instructions, pamphlets, or other written materials from your doctor or pharmacy?: 4 - Often What is the last grade level you completed in school?: unknown  Diabetic? yes  Interpreter Needed?: No  Information entered by :: Lacretia Nicks, [place]   Activities of Daily Living In your present state of health, do you have any difficulty performing the following activities: 10/27/2020 08/30/2020  Hearing? N  -  Vision? N -  Difficulty concentrating or making decisions? N -  Walking or climbing stairs? N -  Dressing or bathing? N -  Doing errands, shopping? N Y  Conservation officer, nature and eating ? N -  Using the Toilet? N -  In the past six months, have you accidently leaked urine? N -  Do you have problems with loss of bowel control? N -  Managing your Medications? Y -  Managing your Finances? Y -  Housekeeping or managing your Housekeeping? Y -  Comment resident of group home -  Some recent data might be hidden    Patient Care Team: Cletis Athens, MD as PCP - General (Internal Medicine)  Indicate any recent Medical  Services you may have received from other than [facility] providers in the past year (date may be approximate).     Assessment:   This is a routine wellness examination for John [name].  Hearing/Vision screen No results found.  Dietary issues and exercise activities discussed: Current Exercise Habits: Home exercise routine, Type of exercise: walking, Time (Minutes): 30, Frequency (Times/Week): 5, Weekly Exercise (Minutes/Week): 150, Intensity: Mild, Exercise limited by: None identified   Goals Addressed   None    Depression Screen PHQ 2/9 Scores 10/27/2020  PHQ - 2 Score 0    Fall Risk No flowsheet data found.  FALL RISK PREVENTION PERTAINING TO THE HOME:  Any stairs in or around the home? No  If so, are there any without handrails? No  Home free of loose throw rugs in walkways, pet beds, electrical cords, etc? Yes  Adequate lighting in your home to reduce risk of falls? Yes   ASSISTIVE DEVICES UTILIZED TO PREVENT FALLS:  Life alert? No  Use of a cane, walker or w/c? No  Grab bars in the bathroom? No  Shower chair or bench in shower? No  Elevated toilet seat or a handicapped toilet? No   TIMED UP AND GO:  Was the test performed? No .  Length of time to ambulate- NA  Gait steady and fast without use of assistive device  Cognitive Function: MMSE - Mini Mental State  Exam 10/27/2020  Not completed: Unable to complete     6CIT Screen 10/27/2020  What Year? 0 points  What month? 0 points  What time? 0 points  Count back from 20 0 points  Months in reverse 0 points  Repeat phrase 0 points  Total Score 0    Immunizations Immunization History  Administered Date(s) Administered   MMR 06/25/1995   Moderna Sars-Covid-2 Vaccination 04/02/2019, 05/01/2019   Pneumococcal Polysaccharide-23 04/28/2016   Td 06/25/1995    TDAP status: Due, Education has been provided regarding the importance of this vaccine. Advised may receive this vaccine at local pharmacy or Health Dept. Aware to provide a copy of the vaccination record if obtained from local pharmacy or Health Dept. Verbalized acceptance and understanding.  Flu Vaccine status: Due, Education has been provided regarding the importance of this vaccine. Advised may receive this vaccine at local pharmacy or Health Dept. Aware to provide a copy of the vaccination record if obtained from local pharmacy or Health Dept. Verbalized acceptance and understanding.  Pneumococcal vaccine status: Due, Education has been provided regarding the importance of this vaccine. Advised may receive this vaccine at local pharmacy or Health Dept. Aware to provide a copy of the vaccination record if obtained from local pharmacy or Health Dept. Verbalized acceptance and understanding.  Covid-19 vaccine status: Information provided on how to obtain vaccines.   Qualifies for Shingles Vaccine? No   Zostavax completed No   Shingrix Completed?: No.    Education has been provided regarding the importance of this vaccine. Patient has been advised to call insurance company to determine out of pocket expense if they have not yet received this vaccine. Advised may also receive vaccine at local pharmacy or Health Dept. Verbalized acceptance and understanding.  Screening Tests Health Maintenance  Topic Date Due   FOOT EXAM  Never done    OPHTHALMOLOGY EXAM  Never done   URINE MICROALBUMIN  Never done   Hepatitis C Screening  Never done   TETANUS/TDAP  06/24/2005   Pneumococcal Vaccine 46-55 Years old (2 - PCV) 04/28/2017  COVID-19 Vaccine (3 - Booster for Moderna series) 06/26/2019   INFLUENZA VACCINE  08/01/2020   HEMOGLOBIN A1C  03/01/2021   HIV Screening  Completed   HPV VACCINES  Aged Out    Health Maintenance  Health Maintenance Due  Topic Date Due   FOOT EXAM  Never done   OPHTHALMOLOGY EXAM  Never done   URINE MICROALBUMIN  Never done   Hepatitis C Screening  Never done   TETANUS/TDAP  06/24/2005   Pneumococcal Vaccine 51-20 Years old (2 - PCV) 04/28/2017   COVID-19 Vaccine (3 - Booster for Moderna series) 06/26/2019   INFLUENZA VACCINE  08/01/2020    Patient not eligible for colonoscopy   Lung Cancer Screening: (Low Dose CT Chest recommended if Age 60-80 years, 30 pack-year currently smoking OR have quit w/in 15years.) does not qualify.   Lung Cancer Screening Referral: NA  Additional Screening:  Hepatitis C Screening: does qualify; Completed no - will be collected with next lab draw   Vision Screening: Recommended annual ophthalmology exams for early detection of glaucoma and other disorders of the eye. Is the patient up to date with their annual eye exam?  Yes  Who is the provider or what is the name of the office in which the patient attends annual eye exams? Unsure  If pt is not established with a provider, would they like to be referred to a provider to establish care? No .   Dental Screening: Recommended annual dental exams for proper oral hygiene  Community Resource Referral / Chronic Care Management: CRR required this visit?  No   CCM required this visit?  No      Plan:     I have personally reviewed and noted the following in the patient's chart:   Medical and social history Use of alcohol, tobacco or illicit drugs  Current medications and supplements including opioid  prescriptions. Patient is not currently taking opioid prescriptions. Functional ability and status Nutritional status Physical activity Advanced directives List of other physicians Hospitalizations, surgeries, and ER visits in previous 12 months Vitals Screenings to include cognitive, depression, and falls Referrals and appointments  In addition, I have reviewed and discussed with patient certain preventive protocols, quality metrics, and best practice recommendations. A written personalized care plan for preventive services as well as general preventive health recommendations were provided to patient.     Lacretia Nicks, [place]   10/27/2020   Nurse Notes:  John [name] , Thank you for taking time to come for your Medicare Wellness Visit. I appreciate your ongoing commitment to your health goals. Please review the following plan we discussed and let me know if I can assist you in the future.   These are the goals we discussed:  Goals   None     This is a list of the screening recommended for you and due dates:  Health Maintenance  Topic Date Due   Complete foot exam   Never done   Eye exam for diabetics  Never done   Urine Protein Check  Never done   Hepatitis C Screening: USPSTF Recommendation to screen - Ages 7-43 yo.  Never done   Tetanus Vaccine  06/24/2005   Pneumococcal Vaccination (2 - PCV) 04/28/2017   COVID-19 Vaccine (3 - Booster for Moderna series) 06/26/2019   Flu Shot  08/01/2020   Hemoglobin A1C  03/01/2021   HIV Screening  Completed   HPV Vaccine  Aged Out     Time spent 40 min

## 2020-10-28 NOTE — Assessment & Plan Note (Signed)

## 2020-10-28 NOTE — Progress Notes (Signed)
I have reviewed this visit and agree with the documentation.   

## 2020-11-02 ENCOUNTER — Encounter: Payer: Self-pay | Admitting: Internal Medicine

## 2020-11-02 ENCOUNTER — Ambulatory Visit (INDEPENDENT_AMBULATORY_CARE_PROVIDER_SITE_OTHER): Payer: 59 | Admitting: Internal Medicine

## 2020-11-02 ENCOUNTER — Other Ambulatory Visit: Payer: Self-pay

## 2020-11-02 VITALS — BP 105/72 | HR 96 | Ht 73.0 in | Wt 281.7 lb

## 2020-11-02 DIAGNOSIS — I10 Essential (primary) hypertension: Secondary | ICD-10-CM

## 2020-11-02 DIAGNOSIS — E039 Hypothyroidism, unspecified: Secondary | ICD-10-CM | POA: Diagnosis not present

## 2020-11-02 DIAGNOSIS — Z23 Encounter for immunization: Secondary | ICD-10-CM | POA: Diagnosis not present

## 2020-11-02 DIAGNOSIS — G4733 Obstructive sleep apnea (adult) (pediatric): Secondary | ICD-10-CM

## 2020-11-02 DIAGNOSIS — E119 Type 2 diabetes mellitus without complications: Secondary | ICD-10-CM

## 2020-11-02 LAB — GLUCOSE, POCT (MANUAL RESULT ENTRY): POC Glucose: 148 mg/dl — AB (ref 70–99)

## 2020-11-02 NOTE — Assessment & Plan Note (Signed)
Patient is using CPAP machine 

## 2020-11-02 NOTE — Assessment & Plan Note (Signed)
Patient is compliant with CPAP 

## 2020-11-02 NOTE — Assessment & Plan Note (Signed)
Blood pressure stable on today's examination

## 2020-11-02 NOTE — Assessment & Plan Note (Signed)
Stable

## 2020-11-02 NOTE — Progress Notes (Signed)
Established Patient Office Visit  Subjective:  Patient ID: John [name]., male    DOB: [birth year]  Age: 45 y.o. MRN: [id number]  CC:  Chief Complaint  Patient presents with   Diabetes    Follow up    Diabetes   John [name]. presents for general checkup 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 tophus 07/20/2019   Morbid obesity ([place])    Paranoid schizophrenia ([place])    Personality disorder ([place])    Sleep apnea    Thyroid disease     Past Surgical History:  Procedure Laterality Date   FRACTURE SURGERY     KNEE ARTHROSCOPY Right 08/30/2020   Procedure: ARTHROSCOPY KNEE- I&D, PARTIAL LATERAL MENISCECTOMY;  Surgeon: [name], MD;  Location: [facility] ORS;  Service: Orthopedics;  Laterality: Right;    Family History  Problem Relation Age of Onset   Glaucoma Mother     Social History   Socioeconomic History   Marital status: Single    Spouse name: Not on file   Number of children: Not on file   Years of education: Not on file   Highest education level: Not on file  Occupational History   Not on file  Tobacco Use   Smoking status: Former   Smokeless tobacco: Never  Vaping Use   Vaping Use: Never used  Substance and Sexual Activity   Alcohol use: Not Currently   Drug use: No   Sexual activity: Not on file  Other Topics Concern   Not on file  Social History Narrative   Not on file   Social Determinants of Health   Financial Resource Strain: Low Risk    Difficulty of Paying Living Expenses: Not hard at all  Food Insecurity: No Food Insecurity   Worried About Charity fundraiser in the Last Year: Never true   [place] in the Last Year: Never true  Transportation Needs: No Transportation Needs   Lack of Transportation (Medical): No   Lack of Transportation (Non-Medical): No  Physical Activity: Sufficiently Active   Days of Exercise per Week: 5 days    Minutes of Exercise per Session: 30 min  Stress: No Stress Concern Present   Feeling of Stress : Not at all  Social Connections: Socially Isolated   Frequency of Communication with Friends and Family: More than three times a week   Frequency of Social Gatherings with Friends and Family: More than three times a week   Attends Religious Services: Never   Marine scientist or Organizations: No   Attends Music therapist: Never   Marital Status: Never married  Human resources officer Violence: Not At Risk   Fear of Current or Ex-Partner: No   Emotionally Abused: No   Physically Abused: No   Sexually Abused: No     Current Outpatient Medications:    ACCU-CHEK GUIDE test strip, USE TO CHECK BLOOD SUGAR DAILY, Disp: 100 each, Rfl: 10   Accu-Chek Softclix Lancets lancets, Use as instructed, Disp: 100 each, Rfl: 12   acetaminophen (TYLENOL) 500 MG tablet, Take 1 tablet (500 mg total) by mouth every 8 (eight) hours as needed for mild pain or moderate pain., Disp: 20 tablet, Rfl: 0   albuterol (PROVENTIL) (2.5 MG/3ML) 0.083% nebulizer solution, USE 1 VIAL VIA NEBULIZER 3 TIMES A DAY, Disp: 300 mL, Rfl: 6  Alcohol Swabs (B-D SINGLE USE SWABS REGULAR) PADS, USE AS DIRECTED FOR BLOOD SUGAR CHECKS., Disp: 100 each, Rfl: 10   asenapine (SAPHRIS) 5 MG SUBL 24 hr tablet, Place 2 tablets (10 mg total) under the tongue at bedtime., Disp: , Rfl:    atenolol (TENORMIN) 100 MG tablet, TAKE 1 TABLET BY MOUTH DAILY, Disp: 30 tablet, Rfl: 3   atorvastatin (LIPITOR) 10 MG tablet, TAKE 1 TABLET BY MOUTH DAILY, Disp: 30 tablet, Rfl: 10   cloZAPine (CLOZARIL) 100 MG tablet, Take 150 mg by mouth at bedtime., Disp: , Rfl:    divalproex (DEPAKOTE ER) 500 MG 24 hr tablet, Take 2,000 mg by mouth at bedtime. , Disp: , Rfl:    docusate sodium (COLACE) 100 MG capsule, Take 1 capsule (100 mg total) by mouth 2 (two) times daily as needed for mild constipation., Disp: 60 capsule, Rfl: 0   levothyroxine (SYNTHROID)  75 MCG tablet, TAKE 1 TABLET BY MOUTH EVERY DAY ON AN EMPTY STOMACH, Disp: 30 tablet, Rfl: 10   loratadine (CLARITIN) 10 MG tablet, TAKE 1 TABLET BY MOUTH DAILY, Disp: 30 tablet, Rfl: 10   metFORMIN (GLUCOPHAGE) 1000 MG tablet, TAKE 1 TABLET BY MOUTH TWICE A DAY WITH FOOD, Disp: 60 tablet, Rfl: 4   omeprazole (PRILOSEC) 20 MG capsule, TAKE 1 CAPSULE BY MOUTH DAILY, Disp: 30 capsule, Rfl: 6   Semaglutide,0.25 or 0.5MG /DOS, (OZEMPIC, 0.25 OR 0.5 MG/DOSE,) 2 MG/1.5ML SOPN, Inject 0.5 mg into the skin once a week., Disp: 1.5 mL, Rfl: 10   No Known Allergies  ROS Review of Systems  Constitutional: Negative.   HENT: Negative.    Eyes: Negative.   Respiratory: Negative.    Cardiovascular: Negative.   Gastrointestinal: Negative.   Endocrine: Negative.   Genitourinary: Negative.   Musculoskeletal: Negative.   Skin: Negative.   Allergic/Immunologic: Negative.   Neurological: Negative.   Hematological: Negative.   Psychiatric/Behavioral: Negative.    All other systems reviewed and are negative.    Objective:    Physical Exam Vitals reviewed.  Constitutional:      Appearance: Normal appearance.  HENT:     Mouth/Throat:     Mouth: Mucous membranes are moist.  Eyes:     Pupils: Pupils are equal, round, and reactive to light.  Neck:     Vascular: No carotid bruit.  Cardiovascular:     Rate and Rhythm: Normal rate and regular rhythm.     Pulses: Normal pulses.     Heart sounds: Normal heart sounds.  Pulmonary:     Effort: Pulmonary effort is normal.     Breath sounds: Normal breath sounds.  Abdominal:     General: Bowel sounds are normal.     Palpations: Abdomen is soft. There is no hepatomegaly, splenomegaly or mass.     Tenderness: There is no abdominal tenderness.     Hernia: No hernia is present.  Musculoskeletal:     Cervical back: Neck supple.     Right lower leg: No edema.     Left lower leg: No edema.  Skin:    Findings: No rash.  Neurological:     Mental Status: He  is alert and oriented to person, place, and time.     Motor: No weakness.  Psychiatric:        Mood and Affect: Mood normal.        Behavior: Behavior normal.    BP 105/72   Pulse 96   Ht 6\' 1"  (1.854 m)   Wt 281  lb 11.2 oz (127.8 kg)   BMI 37.17 kg/m  Wt Readings from Last 3 Encounters:  11/02/20 281 lb 11.2 oz (127.8 kg)  08/29/20 290 lb (131.5 kg)  08/23/20 282 lb 1.6 oz (128 kg)     Health Maintenance Due  Topic Date Due   FOOT EXAM  Never done   URINE MICROALBUMIN  Never done   Hepatitis C Screening  Never done   TETANUS/TDAP  06/24/2005   COVID-19 Vaccine (3 - Booster for Moderna series) 06/26/2019    There are no preventive care reminders to display for this patient.  Lab Results  Component Value Date   TSH 1.842 11/01/2016   Lab Results  Component Value Date   WBC 9.7 08/30/2020   HGB 11.1 (L) 08/30/2020   HCT 36.5 (L) 08/30/2020   MCV 85.1 08/30/2020   PLT 241 08/30/2020   Lab Results  Component Value Date   NA 138 08/30/2020   K 4.4 08/30/2020   CO2 32 08/30/2020   GLUCOSE 157 (H) 08/30/2020   BUN 10 08/30/2020   CREATININE 0.60 (L) 09/03/2020   BILITOT 0.9 12/21/2019   ALKPHOS 47 12/21/2019   AST 13 (L) 12/21/2019   ALT 9 12/21/2019   PROT 9.0 (H) 12/21/2019   ALBUMIN 4.0 12/21/2019   CALCIUM 8.7 (L) 08/30/2020   ANIONGAP 7 08/30/2020   Lab Results  Component Value Date   CHOL 146 11/24/2013   Lab Results  Component Value Date   HDL 22 (L) 11/24/2013   Lab Results  Component Value Date   LDLCALC 94 11/24/2013   Lab Results  Component Value Date   TRIG 152 11/24/2013   No results found for: CHOLHDL Lab Results  Component Value Date   HGBA1C 6.5 (H) 09/01/2020      Assessment & Plan:   Problem List Items Addressed This Visit       Cardiovascular and Mediastinum   Essential hypertension (Chronic)    Blood pressure stable on today's examination        Respiratory   OSA treated with BiPAP    Patient is compliant  with CPAP        Endocrine   Hypothyroidism (Chronic)    Stable      Diabetes mellitus ([place]) - Primary   Relevant Orders   POCT glucose (manual entry) (Completed)   Microalbumin, urine   Other Visit Diagnoses     Need for influenza vaccination       Relevant Orders   Flu Vaccine QUAD 52mo+IM (Fluarix, Fluzone & Alfiuria Quad PF) (Completed)   Need for pneumococcal vaccination       Relevant Orders   Pneumococcal conjugate vaccine 20-valent (Prevnar 20) (Completed)       No orders of the defined types were placed in this encounter.   Follow-up: No follow-ups on file.    Cletis Athens, MD

## 2020-11-03 LAB — MICROALBUMIN, URINE: Microalb, Ur: 28.1 mg/dL

## 2020-11-08 ENCOUNTER — Ambulatory Visit: Payer: 59 | Admitting: Podiatry

## 2020-11-11 ENCOUNTER — Other Ambulatory Visit: Payer: Self-pay

## 2020-11-11 ENCOUNTER — Ambulatory Visit (INDEPENDENT_AMBULATORY_CARE_PROVIDER_SITE_OTHER): Payer: 59 | Admitting: Podiatry

## 2020-11-11 DIAGNOSIS — E0843 Diabetes mellitus due to underlying condition with diabetic autonomic (poly)neuropathy: Secondary | ICD-10-CM | POA: Diagnosis not present

## 2020-11-11 DIAGNOSIS — B351 Tinea unguium: Secondary | ICD-10-CM

## 2020-11-11 DIAGNOSIS — M79674 Pain in right toe(s): Secondary | ICD-10-CM | POA: Diagnosis not present

## 2020-11-11 DIAGNOSIS — M79675 Pain in left toe(s): Secondary | ICD-10-CM | POA: Diagnosis not present

## 2020-11-11 NOTE — Progress Notes (Signed)
   SUBJECTIVE Patient with a history of diabetes mellitus presents to office today complaining of elongated, thickened nails that cause pain while ambulating in shoes.  Patient is unable to trim their own nails. Patient is here for further evaluation and treatment.   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 tophus 07/20/2019   Morbid obesity ([place])    Paranoid schizophrenia ([place])    Personality disorder ([place])    Sleep apnea    Thyroid disease     OBJECTIVE General Patient is awake, alert, and oriented x 3 and in no acute distress. Derm Skin is dry and supple bilateral. Negative open lesions or macerations. Remaining integument unremarkable. Nails are tender, long, thickened and dystrophic with subungual debris, consistent with onychomycosis, 1-5 bilateral. No signs of infection noted. Vasc  DP and PT pedal pulses palpable bilaterally. Temperature gradient within normal limits.  Neuro Epicritic and protective threshold sensation diminished bilaterally.  Musculoskeletal Exam No symptomatic pedal deformities noted bilateral. Muscular strength within normal limits.  ASSESSMENT 1. Diabetes Mellitus w/ peripheral neuropathy 2.  Pain due to onychomycosis of toenails bilateral  PLAN OF CARE 1. Patient evaluated today.  Comprehensive diabetic foot exam performed today 2. Instructed to maintain good pedal hygiene and foot care. Stressed importance of controlling blood sugar.  3. Mechanical debridement of nails 1-5 bilaterally performed using a nail nipper. Filed with dremel without incident.  4. Return to clinic in 3 mos.     Edrick Kins, DPM [facility]  Dr. Edrick Kins, DPM    [address], [place] [address]                Office [phone]  Fax [phone]

## 2020-11-29 ENCOUNTER — Emergency Department
Admission: EM | Admit: 2020-11-29 | Discharge: 2020-11-29 | Disposition: A | Discharge status: Home / Self Care | Payer: 59 | Attending: Emergency Medicine | Admitting: Emergency Medicine

## 2020-11-29 ENCOUNTER — Encounter: Payer: Self-pay | Admitting: *Deleted

## 2020-11-29 DIAGNOSIS — J45909 Unspecified asthma, uncomplicated: Secondary | ICD-10-CM | POA: Diagnosis not present

## 2020-11-29 DIAGNOSIS — M10079 Idiopathic gout, unspecified ankle and foot: Secondary | ICD-10-CM

## 2020-11-29 DIAGNOSIS — Z87891 Personal history of nicotine dependence: Secondary | ICD-10-CM | POA: Diagnosis not present

## 2020-11-29 DIAGNOSIS — I10 Essential (primary) hypertension: Secondary | ICD-10-CM | POA: Insufficient documentation

## 2020-11-29 DIAGNOSIS — Z79899 Other long term (current) drug therapy: Secondary | ICD-10-CM | POA: Insufficient documentation

## 2020-11-29 DIAGNOSIS — M10071 Idiopathic gout, right ankle and foot: Secondary | ICD-10-CM | POA: Insufficient documentation

## 2020-11-29 DIAGNOSIS — E119 Type 2 diabetes mellitus without complications: Secondary | ICD-10-CM | POA: Diagnosis not present

## 2020-11-29 DIAGNOSIS — M25562 Pain in left knee: Secondary | ICD-10-CM | POA: Diagnosis not present

## 2020-11-29 DIAGNOSIS — E039 Hypothyroidism, unspecified: Secondary | ICD-10-CM | POA: Insufficient documentation

## 2020-11-29 DIAGNOSIS — M10072 Idiopathic gout, left ankle and foot: Secondary | ICD-10-CM | POA: Insufficient documentation

## 2020-11-29 DIAGNOSIS — Z7984 Long term (current) use of oral hypoglycemic drugs: Secondary | ICD-10-CM | POA: Insufficient documentation

## 2020-11-29 DIAGNOSIS — J449 Chronic obstructive pulmonary disease, unspecified: Secondary | ICD-10-CM | POA: Insufficient documentation

## 2020-11-29 DIAGNOSIS — M79671 Pain in right foot: Secondary | ICD-10-CM | POA: Diagnosis present

## 2020-11-29 MED ORDER — COLCHICINE 0.6 MG PO TABS
0.6000 mg | ORAL_TABLET | Freq: Once | ORAL | Status: AC
  Administered 2020-11-29: 0.6 mg via ORAL

## 2020-11-29 MED ORDER — KETOROLAC TROMETHAMINE 30 MG/ML IJ SOLN
30.0000 mg | Freq: Once | INTRAMUSCULAR | Status: AC
  Administered 2020-11-29: 30 mg via INTRAMUSCULAR

## 2020-11-29 MED ORDER — PREDNISONE 10 MG (21) PO TBPK: ORAL_TABLET | ORAL | 0 refills | Status: AC

## 2020-11-29 NOTE — Discharge Instructions (Addendum)
Use naproxen/Aleve for anti-inflammatory pain relief. Use up to 500mg  every 12 hours. Do not take more frequently than this. Do not use other NSAIDs (ibuprofen, Advil) while taking this medication. It is safe to take Tylenol with this.   Pick up the prednisone steroid prescription and start the 11-day taper as described.  I think would be worthwhile if you followed up with an orthopedic doctor in the clinic to discuss his rather severe gout.  If he develops fevers or worsening symptoms despite these measures, please return to the ED.

## 2020-11-29 NOTE — ED Provider Notes (Signed)
[facility] Emergency Department Provider Note ____________________________________________   Event Date/Time   First MD Initiated Contact with Patient 11/29/20 1612     (approximate)  I have reviewed the triage vital signs and the nursing notes.  HISTORY  Chief Complaint Foot Pain   HPI John Nanna. is a 45 y.o. malewho presents to the ED for evaluation of foot pain.  Chart review indicates morbidly obese patient with schizophrenia and gout.  Patient presents to the ED from his local group home for evaluation of 5 days of atraumatic bilateral foot and left knee pain.  He reports increasing pain, swelling throughout the midfoot bilaterally, with some pain and tenderness coming up his left leg and similar aching pain to his left knee.  Denies any falls or trauma, but reports weakness to the pain and that his legs have buckled getting up from the couch, so that he fell back down onto the couch.  Denies any fevers, syncopal episodes or systemic illness.  Reports he has not taken any medications currently for his gout.  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 tophus 07/20/2019   Morbid obesity ([place])    Paranoid schizophrenia ([place])    Personality disorder ([place])    Sleep apnea    Thyroid disease     Patient Active Problem List   Diagnosis Date Noted   Acute gout of knee    Obesity hypoventilation syndrome ([place])    Hyperlipidemia    Arthritis, septic, knee ([place]) 08/30/2020   Septic joint ([place]) 08/29/2020   Community acquired pneumonia 08/29/2020   Bronchitis 08/23/2020   Idiopathic sleep related nonobstructive alveolar hypoventilation 08/23/2020   Sore throat 08/23/2020   OSA treated with BiPAP 08/08/2020   Obesity (BMI 30-39.9) 08/08/2020   OSA on CPAP 05/23/2020   CPAP use counseling 05/23/2020   BMI 36.0-36.9,adult 05/23/2020   Bilateral leg  weakness 08/17/2019   Knee arthropathy 08/10/2019   Acute gout due to renal impairment involving right foot 07/08/2019   Encephalopathy acute    Somnolence 11/01/2016   AKI (acute kidney injury) ([place]) 11/01/2016   Sepsis ([place]) 04/27/2016   Respiratory failure ([place]) 12/20/2015   Myocardiopathy ([place]) 11/28/2015   Tobacco abuse 11/28/2015   Acute respiratory failure with hypercapnia ([place])    Sleep apnea    Essential hypertension 11/25/2015   Hypothyroidism 11/25/2015   Paranoid schizophrenia, chronic condition ([place]) 11/25/2015   Acute respiratory failure ([name])    Acute exacerbation of COPD with asthma ([place])    Diabetes mellitus ([place])    Diabetes mellitus due to underlying condition without complication, without long-term current use of insulin ([place])    Morbid obesity ([place]) 09/29/2008    Past Surgical History:  Procedure Laterality Date   FRACTURE SURGERY     KNEE ARTHROSCOPY Right 08/30/2020   Procedure: ARTHROSCOPY KNEE- I&D, PARTIAL LATERAL MENISCECTOMY;  Surgeon: [name], MD;  Location: [facility] ORS;  Service: Orthopedics;  Laterality: Right;    Prior to Admission medications   Medication Sig Start Date End Date Taking? Authorizing Provider  predniSONE (STERAPRED UNI-PAK 21 TAB) 10 MG (21) TBPK tablet Take 4 tablets (40 mg total) by mouth daily for 5 days, THEN 3 tablets (30 mg total) daily for 2 days, THEN 2 tablets (20 mg total) daily for 2 days, THEN 1 tablet (10 mg total) daily for 2 days. 11/29/20 12/10/20  Yes Vladimir Crofts, MD  ACCU-CHEK GUIDE test strip USE TO CHECK BLOOD SUGAR DAILY 10/24/20   Cletis Athens, MD  Accu-Chek Softclix Lancets lancets Use as instructed 09/24/19   Cletis Athens, MD  acetaminophen (TYLENOL) 500 MG tablet Take 1 tablet (500 mg total) by mouth every 8 (eight) hours as needed for mild pain or moderate pain. 09/03/20   Loletha Grayer, MD  albuterol (PROVENTIL) (2.5 MG/3ML) 0.083% nebulizer solution USE 1 VIAL VIA NEBULIZER 3 TIMES A DAY 12/07/19    Cletis Athens, MD  Alcohol Swabs (B-D SINGLE USE SWABS REGULAR) PADS USE AS DIRECTED FOR BLOOD SUGAR CHECKS. 08/20/19   Cletis Athens, MD  asenapine (SAPHRIS) 5 MG SUBL 24 hr tablet Place 2 tablets (10 mg total) under the tongue at bedtime. 09/03/20   Loletha Grayer, MD  atenolol (TENORMIN) 100 MG tablet TAKE 1 TABLET BY MOUTH DAILY 09/12/20   Cletis Athens, MD  atorvastatin (LIPITOR) 10 MG tablet TAKE 1 TABLET BY MOUTH DAILY 08/05/20   Cletis Athens, MD  cloZAPine (CLOZARIL) 100 MG tablet Take 150 mg by mouth at bedtime.    [provider]  divalproex (DEPAKOTE ER) 500 MG 24 hr tablet Take 2,000 mg by mouth at bedtime.     [provider]  docusate sodium (COLACE) 100 MG capsule Take 1 capsule (100 mg total) by mouth 2 (two) times daily as needed for mild constipation. 09/03/20   Loletha Grayer, MD  levothyroxine (SYNTHROID) 75 MCG tablet TAKE 1 TABLET BY MOUTH EVERY DAY ON AN EMPTY STOMACH 08/05/20   Cletis Athens, MD  loratadine (CLARITIN) 10 MG tablet TAKE 1 TABLET BY MOUTH DAILY 09/12/20   Cletis Athens, MD  metFORMIN (GLUCOPHAGE) 1000 MG tablet TAKE 1 TABLET BY MOUTH TWICE A DAY WITH FOOD 08/01/20   Cletis Athens, MD  omeprazole (PRILOSEC) 20 MG capsule TAKE 1 CAPSULE BY MOUTH DAILY 08/01/20   Cletis Athens, MD  oxybutynin (DITROPAN) 5 MG tablet Take 5 mg by mouth daily. 11/02/20   [provider]  Semaglutide,0.25 or 0.5MG /DOS, (OZEMPIC, 0.25 OR 0.5 MG/DOSE,) 2 MG/1.5ML SOPN Inject 0.5 mg into the skin once a week. 10/14/19   Cletis Athens, MD    Allergies Patient has no known allergies.  Family History  Problem Relation Age of Onset   Glaucoma Mother     Social History Social History   Tobacco Use   Smoking status: Former   Smokeless tobacco: Never  Scientific laboratory technician Use: Never used  Substance Use Topics   Alcohol use: Not Currently   Drug use: No    Review of Systems  Constitutional: No fever/chills Eyes: No visual changes. ENT: No sore  throat. Cardiovascular: Denies chest pain. Respiratory: Denies shortness of breath. Gastrointestinal: No abdominal pain.  No nausea, no vomiting.  No diarrhea.  No constipation. Genitourinary: Negative for dysuria. Musculoskeletal: Negative for back pain. Positive for atraumatic bilateral foot and left knee pain. Skin: Negative for rash. Neurological: Negative for headaches, focal weakness or numbness.  ____________________________________________   PHYSICAL EXAM:  VITAL SIGNS: Vitals:   11/29/20 1416 11/29/20 1530  BP: 131/90 133/76  Pulse: (!) 109 (!) 109  Resp: 16 18  Temp: 98.3 F (36.8 C)   SpO2: 97% 95%    Constitutional: Alert and oriented. Well appearing and in no acute distress.  Obese.  Pleasant and conversational. Eyes: Conjunctivae are normal. PERRL. EOMI. Head: Atraumatic. Nose: No congestion/rhinnorhea. Mouth/Throat: Mucous membranes are moist.  Oropharynx non-erythematous. Neck: No stridor. No cervical spine  tenderness to palpation. Cardiovascular: Normal rate, regular rhythm. Grossly normal heart sounds.  Good peripheral circulation. Respiratory: Normal respiratory effort.  No retractions. Lungs CTAB. Gastrointestinal: Soft , nondistended, nontender to palpation. No CVA tenderness. Musculoskeletal: Bilateral feet appear slightly swollen and are tender to palpation around the midfoot dorsally.  No significant evidence of great toe gout bilaterally, seems to be affecting his feet more so. Little bit erythema to the dorsum of the left midfoot, none on the right that I can tell. Full active and passive ROM of bilateral ankles without significant pain. Minimal tenderness to the left knee throughout without focal features or point bony tenderness. No deformity otherwise and no signs of trauma to the extremities. Neurologic:  Normal speech and language. No gross focal neurologic deficits are appreciated.  Skin:  Skin is warm, dry and intact. No rash  noted. Psychiatric: Mood and affect are normal. Speech and behavior are normal. ____________________________________________   LABS (all labs ordered are listed, but only abnormal results are displayed)  Labs Reviewed - No data to display ____________________________________________  12 Lead EKG   ____________________________________________  RADIOLOGY  ED MD interpretation:    Official radiology report(s): No results found.  ____________________________________________   PROCEDURES and INTERVENTIONS  Procedure(s) performed (including Critical Care):  Procedures  Medications  ketorolac (TORADOL) 30 MG/ML injection 30 mg (30 mg Intramuscular Given 11/29/20 1702)  colchicine tablet 0.6 mg (0.6 mg Oral Given 11/29/20 1702)    ____________________________________________   MDM / ED COURSE   45 year old male with history of gout presents to the ED with evidence of a gout flare amenable to trial of outpatient management with orthopedic follow-up.  We will start him on steroids and NSAIDs.  Colchicine provided in the ED.  We will refer out to Ortho and provided return precautions for the ED.  Less likely septic joints.  He has no evidence of sepsis or systemic illness.  No signs of trauma.      ____________________________________________   FINAL CLINICAL IMPRESSION(S) / ED DIAGNOSES  Final diagnoses:  Acute idiopathic gout of foot, unspecified laterality     ED Discharge Orders          Ordered    predniSONE (STERAPRED UNI-PAK 21 TAB) 10 MG (21) TBPK tablet        11/29/20 1706             Nimo Verastegui   Note:  This document was prepared using Systems analyst and may include unintentional dictation errors.    Vladimir Crofts, MD 11/29/20 [phone]

## 2020-11-29 NOTE — ED Triage Notes (Signed)
Pt to ED reporting left sided foot and knee pain. Pt reports when trying to walk he feels like it will "buckle." Pain has been persistent since Friday and pt now reporting left upper leg pain "from bracing" self. No know injury, no swelling, no deformity.

## 2020-11-29 NOTE — ED Notes (Signed)
Called pt contact [name] she is suppose to call back, she was calling her boss to see if she could come get pt

## 2020-12-06 ENCOUNTER — Ambulatory Visit (INDEPENDENT_AMBULATORY_CARE_PROVIDER_SITE_OTHER): Payer: 59 | Admitting: Podiatry

## 2020-12-06 ENCOUNTER — Other Ambulatory Visit: Payer: Self-pay

## 2020-12-06 DIAGNOSIS — M1000 Idiopathic gout, unspecified site: Secondary | ICD-10-CM | POA: Diagnosis not present

## 2020-12-06 MED ORDER — DICLOFENAC SODIUM 75 MG PO TBEC: 75.0000 mg | DELAYED_RELEASE_TABLET | Freq: Two times a day (BID) | ORAL | 1 refills | Status: DC

## 2020-12-06 NOTE — Progress Notes (Signed)
   HPI: 45 y.o. male presenting today for new complaint regarding pain and tenderness to the bilateral lower extremities all the way up to the knees.  Patient states that he was seen in the emergency department on 11/29/2020 for an acute gout flareup.  He was referred here for follow-up treatment and evaluation.  He says that his feet and ankles hurt but most pain is coming from his knees bilateral lower extremities.  He says that he received a steroid injection in the arm and was also given oral colchicine.  He says that he does feel significantly better.  He presents for further treatment evaluation  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 tophus 07/20/2019   Morbid obesity ([place])    Paranoid schizophrenia ([place])    Personality disorder ([place])    Sleep apnea    Thyroid disease      Physical Exam: General: The patient is alert and oriented x3 in no acute distress.  Dermatology: Skin is warm, dry and supple bilateral lower extremities. Negative for open lesions or macerations.  Vascular: Palpable pedal pulses bilaterally. No edema or erythema noted. Capillary refill within normal limits.  Neurological: Epicritic and protective threshold grossly intact bilaterally.   Musculoskeletal Exam: Diffuse pain on palpation throughout the midfoot, ankles, and knees bilateral lower extremities.  Bursitis with edema noted to the bilateral knees  Assessment: 1.  Acute inflammatory gout bilateral lower extremities   Plan of Care:  1. Patient evaluated.  Patient's medical chart and emergency department encounters were reviewed today 2.  Prescription for diclofenac 75 mg 2 times daily.  Patient has completed the oral colchicine that was prescribed in the ED. 3.  Patient's most symptomatic areas is his knees.  Recommend follow-up with orthopedic 4.  Return to clinic as needed      Edrick Kins, DPM [facility]  Dr. Edrick Kins, DPM    [address] [place],  [id number]                Office [phone]  Fax [phone]

## 2020-12-09 ENCOUNTER — Ambulatory Visit (INDEPENDENT_AMBULATORY_CARE_PROVIDER_SITE_OTHER): Payer: 59 | Admitting: Internal Medicine

## 2020-12-09 ENCOUNTER — Other Ambulatory Visit: Payer: Self-pay

## 2020-12-09 ENCOUNTER — Encounter: Payer: Self-pay | Admitting: Internal Medicine

## 2020-12-09 DIAGNOSIS — I10 Essential (primary) hypertension: Secondary | ICD-10-CM | POA: Diagnosis not present

## 2020-12-09 DIAGNOSIS — M1710 Unilateral primary osteoarthritis, unspecified knee: Secondary | ICD-10-CM

## 2020-12-09 DIAGNOSIS — R059 Cough, unspecified: Secondary | ICD-10-CM

## 2020-12-09 DIAGNOSIS — E039 Hypothyroidism, unspecified: Secondary | ICD-10-CM

## 2020-12-09 DIAGNOSIS — J40 Bronchitis, not specified as acute or chronic: Secondary | ICD-10-CM | POA: Diagnosis not present

## 2020-12-09 DIAGNOSIS — G4734 Idiopathic sleep related nonobstructive alveolar hypoventilation: Secondary | ICD-10-CM | POA: Diagnosis not present

## 2020-12-09 DIAGNOSIS — Z6836 Body mass index (BMI) 36.0-36.9, adult: Secondary | ICD-10-CM

## 2020-12-09 DIAGNOSIS — Z720 Tobacco use: Secondary | ICD-10-CM

## 2020-12-09 LAB — POC COVID19 BINAXNOW: SARS Coronavirus 2 Ag: NEGATIVE

## 2020-12-09 MED ORDER — AZITHROMYCIN 250 MG PO TABS: ORAL_TABLET | ORAL | 0 refills | Status: AC

## 2020-12-11 NOTE — Assessment & Plan Note (Signed)
Patient says he does not smoke

## 2020-12-11 NOTE — Assessment & Plan Note (Signed)

## 2020-12-11 NOTE — Progress Notes (Signed)
Established Patient Office Visit  Subjective:  Patient ID: John Arreguin., male    DOB: 1975/08/04  Age: 45 y.o. MRN: [id number]  CC:  Chief Complaint  Patient presents with   Cough    Patient has cough and congestion     Cough   John [name]. presents for general checkup.  He is using his CPAP machine now, his sleepiness has decreased and he is more alert and responsive 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 tophus 07/20/2019   Morbid obesity ([place])    Paranoid schizophrenia ([place])    Personality disorder ([place])    Sleep apnea    Thyroid disease     Past Surgical History:  Procedure Laterality Date   FRACTURE SURGERY     KNEE ARTHROSCOPY Right 08/30/2020   Procedure: ARTHROSCOPY KNEE- I&D, PARTIAL LATERAL MENISCECTOMY;  Surgeon: [name], MD;  Location: [facility] ORS;  Service: Orthopedics;  Laterality: Right;    Family History  Problem Relation Age of Onset   Glaucoma Mother     Social History   Socioeconomic History   Marital status: Single    Spouse name: Not on file   Number of children: Not on file   Years of education: Not on file   Highest education level: Not on file  Occupational History   Not on file  Tobacco Use   Smoking status: Former   Smokeless tobacco: Never  Vaping Use   Vaping Use: Never used  Substance and Sexual Activity   Alcohol use: Not Currently   Drug use: No   Sexual activity: Not on file  Other Topics Concern   Not on file  Social History Narrative   Not on file   Social Determinants of Health   Financial Resource Strain: Low Risk    Difficulty of Paying Living Expenses: Not hard at all  Food Insecurity: No Food Insecurity   Worried About Charity fundraiser in the Last Year: Never true   [place] in the Last Year: Never true  Transportation Needs: No Transportation Needs   Lack of Transportation (Medical): No   Lack  of Transportation (Non-Medical): No  Physical Activity: Sufficiently Active   Days of Exercise per Week: 5 days   Minutes of Exercise per Session: 30 min  Stress: No Stress Concern Present   Feeling of Stress : Not at all  Social Connections: Socially Isolated   Frequency of Communication with Friends and Family: More than three times a week   Frequency of Social Gatherings with Friends and Family: More than three times a week   Attends Religious Services: Never   Marine scientist or Organizations: No   Attends Music therapist: Never   Marital Status: Never married  Human resources officer Violence: Not At Risk   Fear of Current or Ex-Partner: No   Emotionally Abused: No   Physically Abused: No   Sexually Abused: No     Current Outpatient Medications:    ACCU-CHEK GUIDE test strip, USE TO CHECK BLOOD SUGAR DAILY, Disp: 100 each, Rfl: 10   Accu-Chek Softclix Lancets lancets, Use as instructed, Disp: 100 each, Rfl: 12   acetaminophen (TYLENOL) 500 MG tablet, Take 1 tablet (500 mg total) by mouth every 8 (eight) hours as needed for mild pain or moderate pain., Disp: 20 tablet, Rfl: 0  albuterol (PROVENTIL) (2.5 MG/3ML) 0.083% nebulizer solution, USE 1 VIAL VIA NEBULIZER 3 TIMES A DAY, Disp: 300 mL, Rfl: 6   Alcohol Swabs (B-D SINGLE USE SWABS REGULAR) PADS, USE AS DIRECTED FOR BLOOD SUGAR CHECKS., Disp: 100 each, Rfl: 10   asenapine (SAPHRIS) 5 MG SUBL 24 hr tablet, Place 2 tablets (10 mg total) under the tongue at bedtime., Disp: , Rfl:    atenolol (TENORMIN) 100 MG tablet, TAKE 1 TABLET BY MOUTH DAILY, Disp: 30 tablet, Rfl: 3   atorvastatin (LIPITOR) 10 MG tablet, TAKE 1 TABLET BY MOUTH DAILY, Disp: 30 tablet, Rfl: 10   azithromycin (ZITHROMAX) 250 MG tablet, Take 2 tablets on day 1, then 1 tablet daily on days 2 through 5, Disp: 6 tablet, Rfl: 0   cloZAPine (CLOZARIL) 100 MG tablet, Take 150 mg by mouth at bedtime., Disp: , Rfl:    diclofenac (VOLTAREN) 75 MG EC tablet,  Take 1 tablet (75 mg total) by mouth 2 (two) times daily., Disp: 60 tablet, Rfl: 1   divalproex (DEPAKOTE ER) 500 MG 24 hr tablet, Take 2,000 mg by mouth at bedtime. , Disp: , Rfl:    docusate sodium (COLACE) 100 MG capsule, Take 1 capsule (100 mg total) by mouth 2 (two) times daily as needed for mild constipation., Disp: 60 capsule, Rfl: 0   levothyroxine (SYNTHROID) 75 MCG tablet, TAKE 1 TABLET BY MOUTH EVERY DAY ON AN EMPTY STOMACH, Disp: 30 tablet, Rfl: 10   loratadine (CLARITIN) 10 MG tablet, TAKE 1 TABLET BY MOUTH DAILY, Disp: 30 tablet, Rfl: 10   metFORMIN (GLUCOPHAGE) 1000 MG tablet, TAKE 1 TABLET BY MOUTH TWICE A DAY WITH FOOD, Disp: 60 tablet, Rfl: 4   omeprazole (PRILOSEC) 20 MG capsule, TAKE 1 CAPSULE BY MOUTH DAILY, Disp: 30 capsule, Rfl: 6   oxybutynin (DITROPAN) 5 MG tablet, Take 5 mg by mouth daily., Disp: , Rfl:    Semaglutide,0.25 or 0.5MG /DOS, (OZEMPIC, 0.25 OR 0.5 MG/DOSE,) 2 MG/1.5ML SOPN, Inject 0.5 mg into the skin once a week., Disp: 1.5 mL, Rfl: 10   No Known Allergies  ROS Review of Systems  Respiratory:  Positive for cough.      Objective:    Physical Exam Constitutional:      Appearance: He is obese.  HENT:     Head: Normocephalic.     Mouth/Throat:     Mouth: Mucous membranes are moist.  Pulmonary:     Effort: Pulmonary effort is normal.     Breath sounds: No wheezing or rales.  Abdominal:     General: There is distension.     Palpations: Abdomen is soft.    There were no vitals taken for this visit. Wt Readings from Last 3 Encounters:  11/02/20 281 lb 11.2 oz (127.8 kg)  08/29/20 290 lb (131.5 kg)  08/23/20 282 lb 1.6 oz (128 kg)     Health Maintenance Due  Topic Date Due   Hepatitis C Screening  Never done   TETANUS/TDAP  06/24/2005   COVID-19 Vaccine (3 - Booster for Moderna series) 06/26/2019    There are no preventive care reminders to display for this patient.  Lab Results  Component Value Date   TSH 1.842 11/01/2016   Lab  Results  Component Value Date   WBC 9.7 08/30/2020   HGB 11.1 (L) 08/30/2020   HCT 36.5 (L) 08/30/2020   MCV 85.1 08/30/2020   PLT 241 08/30/2020   Lab Results  Component Value Date   NA 138 08/30/2020  K 4.4 08/30/2020   CO2 32 08/30/2020   GLUCOSE 157 (H) 08/30/2020   BUN 10 08/30/2020   CREATININE 0.60 (L) 09/03/2020   BILITOT 0.9 12/21/2019   ALKPHOS 47 12/21/2019   AST 13 (L) 12/21/2019   ALT 9 12/21/2019   PROT 9.0 (H) 12/21/2019   ALBUMIN 4.0 12/21/2019   CALCIUM 8.7 (L) 08/30/2020   ANIONGAP 7 08/30/2020   Lab Results  Component Value Date   CHOL 146 11/24/2013   Lab Results  Component Value Date   HDL 22 (L) 11/24/2013   Lab Results  Component Value Date   LDLCALC 94 11/24/2013   Lab Results  Component Value Date   TRIG 152 11/24/2013   No results found for: CHOLHDL Lab Results  Component Value Date   HGBA1C 6.5 (H) 09/01/2020      Assessment & Plan:   Problem List Items Addressed This Visit       Cardiovascular and Mediastinum   Essential hypertension (Chronic)    The following hypertensive lifestyle modification were recommended and discussed:  .  3. Importance of regular aerobic exercise and losing weight. 4. Reduce dietary saturated fat and cholesterol intake for overall cardiovascular health. 5. Maintaining adequate dietary potassium, calcium, and magnesium intake. 6. Regular monitoring of the blood pressure. 7. Reduce sodium intake to less than 100 mmol/day (less than 2.3 gm of sodium or less than 6 gm of sodium choride)         Respiratory   Sleep apnea    Now patient is compliant with the CPAP machine and is feeling better      Bronchitis    We will give a course of Z-Pak        Endocrine   Hypothyroidism (Chronic)    Advised to take the thyroid medication before breakfast        Musculoskeletal and Integument   Knee arthropathy    There is no evidence of acute inflammation at the present time patient was advised to  lose weight.  [facility]er to [facility] if needed        Other   Tobacco abuse    Patient says he does not smoke      BMI 36.0-36.9,adult    - I encouraged the patient to lose weight.  - I educated them on making healthy dietary choices including eating more fruits and vegetables and less fried foods. - I encouraged the patient to exercise more, and educated on the benefits of exercise including weight loss, diabetes prevention, and hypertension prevention.   Dietary counseling with a registered dietician  Referral to a weight management support group (e.g. Weight Watchers, Overeaters Anonymous)  If your BMI is greater than 29 or you have gained more than 15 pounds you should work on weight loss.  Attend a healthy cooking class       Other Visit Diagnoses     Cough, unspecified type    -  Primary   Relevant Orders   POC COVID-19 (Completed)       Meds ordered this encounter  Medications   azithromycin (ZITHROMAX) 250 MG tablet    Sig: Take 2 tablets on day 1, then 1 tablet daily on days 2 through 5    Dispense:  6 tablet    Refill:  0    Follow-up: No follow-ups on file.    Cletis Athens, MD

## 2020-12-11 NOTE — Assessment & Plan Note (Signed)
The following hypertensive lifestyle modification were recommended and discussed:  ? ?3. Importance of regular aerobic exercise and losing weight. ?4. Reduce dietary saturated fat and cholesterol intake for overall cardiovascular health. 5. Maintaining adequate dietary potassium, calcium, and magnesium intake. ?6. Regular monitoring of the blood pressure. ?7. Reduce sodium intake to less than 100 mmol/day (less than 2.3 gm of sodium or less than 6 gm of sodium choride)  ?

## 2020-12-11 NOTE — Assessment & Plan Note (Signed)
Now patient is compliant with the CPAP machine and is feeling better

## 2020-12-11 NOTE — Assessment & Plan Note (Signed)
There is no evidence of acute inflammation at the present time patient was advised to lose weight.  Refer to [facility] if needed

## 2020-12-11 NOTE — Assessment & Plan Note (Signed)
We will give a course of Z-Pak

## 2020-12-11 NOTE — Assessment & Plan Note (Signed)
Advised to take the thyroid medication before breakfast

## 2020-12-12 ENCOUNTER — Ambulatory Visit (INDEPENDENT_AMBULATORY_CARE_PROVIDER_SITE_OTHER): Payer: 59 | Admitting: Internal Medicine

## 2020-12-12 VITALS — BP 103/77 | HR 92 | Resp 20 | Ht 74.0 in | Wt 271.0 lb

## 2020-12-12 DIAGNOSIS — I420 Dilated cardiomyopathy: Secondary | ICD-10-CM | POA: Diagnosis not present

## 2020-12-12 DIAGNOSIS — E669 Obesity, unspecified: Secondary | ICD-10-CM

## 2020-12-12 DIAGNOSIS — G4733 Obstructive sleep apnea (adult) (pediatric): Secondary | ICD-10-CM

## 2020-12-12 DIAGNOSIS — I10 Essential (primary) hypertension: Secondary | ICD-10-CM

## 2020-12-12 NOTE — Patient Instructions (Signed)

## 2020-12-12 NOTE — Progress Notes (Signed)
[facility] [place], [place] [address]  Pulmonary Sleep Medicine   Office Visit Note  Patient Name: John [name]. DOB: 12/11/1975 MRN [id number]    Chief Complaint: Obstructive Sleep Apnea visit  Brief History:  John [name] is seen today for follow up The patient has a 6 year history of sleep apnea. Patient is not using PAP nightly.  The patient feels like he needs to spit up whenever he uses after sleeping with PAP.  The patient reports not using from PAP use. Epworth Sleepiness Score is 20 out of 24. The patient does take  daily naps off PAP 30 - 60 minutes. The patient complains of the following: iar feeling too cool and makes him cough  The compliance download shows  compliance with an average use time of 1:27 hours @  8%. The AHI is 25.8  The patient does not complain of limb movements disrupting sleep.  ROS  General: (-) fever, (-) chills, (-) night sweat Nose and Sinuses: (-) nasal stuffiness or itchiness, (-) postnasal drip, (-) nosebleeds, (-) sinus trouble. Mouth and Throat: (-) sore throat, (-) hoarseness. Neck: (-) swollen glands, (-) enlarged thyroid, (-) neck pain. Respiratory: + cough, + shortness of breath, + wheezing. Neurologic: + numbness, + tingling. Psychiatric: + anxiety, + depression   Current Medication: Outpatient Encounter Medications as of 12/12/2020  Medication Sig   ACCU-CHEK GUIDE test strip USE TO CHECK BLOOD SUGAR DAILY   Accu-Chek Softclix Lancets lancets Use as instructed   acetaminophen (TYLENOL) 500 MG tablet Take 1 tablet (500 mg total) by mouth every 8 (eight) hours as needed for mild pain or moderate pain.   albuterol (PROVENTIL) (2.5 MG/3ML) 0.083% nebulizer solution USE 1 VIAL VIA NEBULIZER 3 TIMES A DAY   Alcohol Swabs (B-D SINGLE USE SWABS REGULAR) PADS USE AS DIRECTED FOR BLOOD SUGAR CHECKS.   asenapine (SAPHRIS) 5 MG SUBL 24 hr tablet Place 2 tablets (10 mg total) under the tongue at bedtime.   atenolol  (TENORMIN) 100 MG tablet TAKE 1 TABLET BY MOUTH DAILY   atorvastatin (LIPITOR) 10 MG tablet TAKE 1 TABLET BY MOUTH DAILY   azithromycin (ZITHROMAX) 250 MG tablet Take 2 tablets on day 1, then 1 tablet daily on days 2 through 5   cloZAPine (CLOZARIL) 100 MG tablet Take 150 mg by mouth at bedtime.   diclofenac (VOLTAREN) 75 MG EC tablet Take 1 tablet (75 mg total) by mouth 2 (two) times daily.   divalproex (DEPAKOTE ER) 500 MG 24 hr tablet Take 2,000 mg by mouth at bedtime.    docusate sodium (COLACE) 100 MG capsule Take 1 capsule (100 mg total) by mouth 2 (two) times daily as needed for mild constipation.   levothyroxine (SYNTHROID) 75 MCG tablet TAKE 1 TABLET BY MOUTH EVERY DAY ON AN EMPTY STOMACH   loratadine (CLARITIN) 10 MG tablet TAKE 1 TABLET BY MOUTH DAILY   metFORMIN (GLUCOPHAGE) 1000 MG tablet TAKE 1 TABLET BY MOUTH TWICE A DAY WITH FOOD   omeprazole (PRILOSEC) 20 MG capsule TAKE 1 CAPSULE BY MOUTH DAILY   oxybutynin (DITROPAN) 5 MG tablet Take 5 mg by mouth daily.   Semaglutide,0.25 or 0.5MG /DOS, (OZEMPIC, 0.25 OR 0.5 MG/DOSE,) 2 MG/1.5ML SOPN Inject 0.5 mg into the skin once a week.   No facility-administered encounter medications on file as of 12/12/2020.    Surgical History: Past Surgical History:  Procedure Laterality Date   FRACTURE SURGERY     KNEE ARTHROSCOPY Right 08/30/2020  Procedure: ARTHROSCOPY KNEE- I&D, PARTIAL LATERAL MENISCECTOMY;  Surgeon: [name], MD;  Location: [facility] ORS;  Service: Orthopedics;  Laterality: Right;    Medical History: 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 tophus 07/20/2019   Morbid obesity ([place])    Paranoid schizophrenia ([place])    Personality disorder ([place])    Sleep apnea    Thyroid disease     Family History: Non contributory to the present illness  Social History: Social History   Socioeconomic History   Marital status: Single     Spouse name: Not on file   Number of children: Not on file   Years of education: Not on file   Highest education level: Not on file  Occupational History   Not on file  Tobacco Use   Smoking status: Former   Smokeless tobacco: Never  Vaping Use   Vaping Use: Never used  Substance and Sexual Activity   Alcohol use: Not Currently   Drug use: No   Sexual activity: Not on file  Other Topics Concern   Not on file  Social History Narrative   Not on file   Social Determinants of Health   Financial Resource Strain: Low Risk    Difficulty of Paying Living Expenses: Not hard at all  Food Insecurity: No Food Insecurity   Worried About Charity fundraiser in the Last Year: Never true   Ran Out of Food in the Last Year: Never true  Transportation Needs: No Transportation Needs   Lack of Transportation (Medical): No   Lack of Transportation (Non-Medical): No  Physical Activity: Sufficiently Active   Days of Exercise per Week: 5 days   Minutes of Exercise per Session: 30 min  Stress: No Stress Concern Present   Feeling of Stress : Not at all  Social Connections: Socially Isolated   Frequency of Communication with Friends and Family: More than three times a week   Frequency of Social Gatherings with Friends and Family: More than three times a week   Attends Religious Services: Never   Marine scientist or Organizations: No   Attends Music therapist: Never   Marital Status: Never married  Human resources officer Violence: Not At Risk   Fear of Current or Ex-Partner: No   Emotionally Abused: No   Physically Abused: No   Sexually Abused: No    Vital Signs: Blood pressure 103/77, pulse 92, resp. rate 20, height 6\' 2"  (1.88 m), weight 271 lb (122.9 kg), SpO2 96 %. Body mass index is 34.79 kg/m.    Examination: General Appearance: The patient is well-developed, well-nourished, and in no distress. Neck Circumference: 43 Skin: Gross inspection of skin  unremarkable. Head: normocephalic, no gross deformities. Eyes: no gross deformities noted. ENT: ears appear grossly normal Neurologic: Alert and oriented. No involuntary movements.    EPWORTH SLEEPINESS SCALE:  Scale:  (0)= no chance of dozing; (1)= slight chance of dozing; (2)= moderate chance of dozing; (3)= high chance of dozing  Chance  Situtation    Sitting and reading: 3    Watching TV: 3    Sitting Inactive in public: 1    As a passenger in car: 3      Lying down to rest: 3    Sitting and talking: 2    Sitting quielty after lunch:      In a car, stopped in  traffic: 2   TOTAL SCORE:   20 out of 24    SLEEP STUDIES:  Split 11/2015 - AHI 69, low SpO2 84%,   CPAP COMPLIANCE DATA:  Date Range: 10/12/20 - 12/10/20  Average Daily Use: 1:27  hours  Median Use: 1:25 hours  Compliance for > 4 Hours: 8%  AHI: 25.8 respiratory events per hour  Days Used: 44/60  Mask Leak: 13.7 lpm  95th Percentile Pressure: 24/17cmH2O         LABS: Recent Results (from the past 2160 hour(s))  POCT glucose (manual entry)     Status: None   Collection Time: 10/05/20  4:19 PM  Result Value Ref Range   POC Glucose 85 70 - 99 mg/dl  POCT glucose (manual entry)     Status: Abnormal   Collection Time: 11/02/20  2:56 PM  Result Value Ref Range   POC Glucose 148 (A) 70 - 99 mg/dl  Microalbumin, urine     Status: None   Collection Time: 11/02/20  3:16 PM  Result Value Ref Range   Microalb, Ur 28.1 mg/dL    Comment: Reference Range Not established    RAM      Comment: . The ADA defines abnormalities in albumin excretion as follows: Marland Kitchen Albuminuria Category         Result (mcg/mg creatinine) . Normal to Mildly increased    <30 Moderately increased          30-299  Severely increased            > OR = 300 . The ADA recommends that at least two of three specimens collected within a 3-6 month period be abnormal before considering a patient to be within a  diagnostic category.   POC COVID-19     Status: None   Collection Time: 12/09/20  9:09 AM  Result Value Ref Range   SARS Coronavirus 2 Ag Negative Negative    Radiology: No results found.  No results found.  No results found.    Assessment and Plan: Patient Active Problem List   Diagnosis Date Noted   Acute gout of knee    Obesity hypoventilation syndrome ([place])    Hyperlipidemia    Arthritis, septic, knee ([place]) 08/30/2020   Septic joint ([place]) 08/29/2020   Community acquired pneumonia 08/29/2020   Bronchitis 08/23/2020   Idiopathic sleep related nonobstructive alveolar hypoventilation 08/23/2020   Sore throat 08/23/2020   OSA treated with BiPAP 08/08/2020   Obesity (BMI 30-39.9) 08/08/2020   OSA on CPAP 05/23/2020   CPAP use counseling 05/23/2020   BMI 36.0-36.9,adult 05/23/2020   Bilateral leg weakness 08/17/2019   Knee arthropathy 08/10/2019   Acute gout due to renal impairment involving right foot 07/08/2019   Encephalopathy acute    Somnolence 11/01/2016   AKI (acute kidney injury) ([place]) 11/01/2016   Sepsis ([place]) 04/27/2016   Respiratory failure ([place]) 12/20/2015   Myocardiopathy ([place]) 11/28/2015   Tobacco abuse 11/28/2015   Acute respiratory failure with hypercapnia ([place])    Sleep apnea    Essential hypertension 11/25/2015   Hypothyroidism 11/25/2015   Paranoid schizophrenia, chronic condition ([place]) 11/25/2015   Acute respiratory failure ([name])    Acute exacerbation of COPD with asthma ([place])    Diabetes mellitus ([place])    Diabetes mellitus due to underlying condition without complication, without long-term current use of insulin ([place])    Morbid obesity ([place]) 09/29/2008   1. OSA treated with BiPAP The patient does tolerate PAP and reports  benefit  from PAP use. The patient was reminded how to clean equipment and advised to replace supplies routinely. His AHI is not controlled, and pt has not been using the bipap consistently, we do need to get a bipap  titration. Pt is agreeable. He will need assist from caregiver for the study. The patient was also counselled on weight loss. The compliance is poor. The AHI is 25.8.   OSA treated with bipap. Not controlled. Will get bipap titration, f/u 30d after pressure change.   2. Obesity (BMI 30-39.9) Obesity Counseling: Had a lengthy discussion regarding patients BMI and weight issues. Patient was instructed on portion control as well as increased activity. Also discussed caloric restrictions with trying to maintain intake less than 2000 Kcal. Discussions were made in accordance with the 5As of weight management. Simple actions such as not eating late and if able to, taking a walk is suggested.   3. Dilated cardiomyopathy ([place]) Follows with cardiology. Low  normal EF on echo in 2019. Stable.   4. Essential hypertension Hypertension Counseling:   The following hypertensive lifestyle modification were recommended and discussed:  1. Limiting alcohol intake to less than 1 oz/day of ethanol:(24 oz of beer or 8 oz of wine or 2 oz of 100-proof whiskey). 2. Take baby ASA 81 mg daily. 3. Importance of regular aerobic exercise and losing weight. 4. Reduce dietary saturated fat and cholesterol intake for overall cardiovascular health. 5. Maintaining adequate dietary potassium, calcium, and magnesium intake. 6. Regular monitoring of the blood pressure. 7. Reduce sodium intake to less than 100 mmol/day (less than 2.3 gm of sodium or less than 6 gm of sodium choride)      .   General Counseling: I have discussed the findings of the evaluation and examination with John [name].  I have also discussed any further diagnostic evaluation thatmay be needed or ordered today. John [name] verbalizes understanding of the findings of todays visit. We also reviewed his medications today and discussed drug interactions and [name] effects including but not limited excessive drowsiness and altered mental states. We also discussed that there is  always a risk not just to him but also people around him. he has been encouraged to call the office with any questions or concerns that should arise related to todays visit.  No orders of the defined types were placed in this encounter.       I have personally obtained a history, examined the patient, evaluated laboratory and imaging results, formulated the assessment and plan and placed orders. This patient was seen today by Tressie Ellis, PA-C in collaboration with Dr. Devona Konig.   Allyne Gee, MD [facility] Diplomate ABMS Pulmonary Critical Care Medicine and Sleep Medicine

## 2020-12-15 ENCOUNTER — Other Ambulatory Visit: Payer: Self-pay | Admitting: Internal Medicine

## 2021-01-10 ENCOUNTER — Ambulatory Visit (INDEPENDENT_AMBULATORY_CARE_PROVIDER_SITE_OTHER): Payer: 59 | Admitting: Podiatry

## 2021-01-10 ENCOUNTER — Encounter: Payer: Self-pay | Admitting: Podiatry

## 2021-01-10 ENCOUNTER — Ambulatory Visit (INDEPENDENT_AMBULATORY_CARE_PROVIDER_SITE_OTHER): Payer: 59

## 2021-01-10 ENCOUNTER — Other Ambulatory Visit: Payer: Self-pay

## 2021-01-10 DIAGNOSIS — M1000 Idiopathic gout, unspecified site: Secondary | ICD-10-CM

## 2021-01-11 ENCOUNTER — Other Ambulatory Visit: Payer: Self-pay | Admitting: Internal Medicine

## 2021-01-12 LAB — URIC ACID: Uric Acid: 8.1 mg/dL (ref 3.8–8.4)

## 2021-01-18 ENCOUNTER — Encounter: Payer: Self-pay | Admitting: Internal Medicine

## 2021-01-18 ENCOUNTER — Ambulatory Visit (INDEPENDENT_AMBULATORY_CARE_PROVIDER_SITE_OTHER): Payer: 59 | Admitting: Internal Medicine

## 2021-01-18 ENCOUNTER — Other Ambulatory Visit: Payer: Self-pay

## 2021-01-18 VITALS — BP 125/96 | HR 76 | Ht 74.0 in | Wt 286.7 lb

## 2021-01-18 DIAGNOSIS — G4734 Idiopathic sleep related nonobstructive alveolar hypoventilation: Secondary | ICD-10-CM

## 2021-01-18 DIAGNOSIS — F200 Paranoid schizophrenia: Secondary | ICD-10-CM

## 2021-01-18 DIAGNOSIS — Z720 Tobacco use: Secondary | ICD-10-CM

## 2021-01-18 DIAGNOSIS — I10 Essential (primary) hypertension: Secondary | ICD-10-CM

## 2021-01-18 DIAGNOSIS — E119 Type 2 diabetes mellitus without complications: Secondary | ICD-10-CM | POA: Diagnosis not present

## 2021-01-18 LAB — GLUCOSE, POCT (MANUAL RESULT ENTRY): POC Glucose: 134 mg/dl — AB (ref 70–99)

## 2021-01-18 NOTE — Assessment & Plan Note (Signed)
Patient stated he has stopped smoking

## 2021-01-18 NOTE — Assessment & Plan Note (Signed)

## 2021-01-18 NOTE — Assessment & Plan Note (Signed)
Patient was advised to lose weight 

## 2021-01-18 NOTE — Assessment & Plan Note (Signed)
Stable at the present time. 

## 2021-01-18 NOTE — Progress Notes (Signed)
Established Patient Office Visit  Subjective:  Patient ID: John Urwin., male    DOB: [birth year]  Age: 46 y.o. MRN: [id number]  CC:  Chief Complaint  Patient presents with   Diabetes   Knee Pain    Patient complains of left knee pain. Patient states it is very painful and difficult to lift.    Diabetes  Knee Pain    John Dawley. presents for general checkup.  He is not drowsy   He is using the CPAP machine, blood sugar is being checked on regular basis  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 tophus 07/20/2019   Morbid obesity ([place])    Paranoid schizophrenia ([place])    Personality disorder ([place])    Sleep apnea    Thyroid disease     Past Surgical History:  Procedure Laterality Date   FRACTURE SURGERY     KNEE ARTHROSCOPY Right 08/30/2020   Procedure: ARTHROSCOPY KNEE- I&D, PARTIAL LATERAL MENISCECTOMY;  Surgeon: [name], MD;  Location: [facility] ORS;  Service: Orthopedics;  Laterality: Right;    Family History  Problem Relation Age of Onset   Glaucoma Mother     Social History   Socioeconomic History   Marital status: Single    Spouse name: Not on file   Number of children: Not on file   Years of education: Not on file   Highest education level: Not on file  Occupational History   Not on file  Tobacco Use   Smoking status: Former   Smokeless tobacco: Never  Vaping Use   Vaping Use: Never used  Substance and Sexual Activity   Alcohol use: Not Currently   Drug use: No   Sexual activity: Not on file  Other Topics Concern   Not on file  Social History Narrative   Not on file   Social Determinants of Health   Financial Resource Strain: Low Risk    Difficulty of Paying Living Expenses: Not hard at all  Food Insecurity: No Food Insecurity   Worried About Charity fundraiser in the Last Year: Never true   [place] in the Last Year: Never true   Transportation Needs: No Transportation Needs   Lack of Transportation (Medical): No   Lack of Transportation (Non-Medical): No  Physical Activity: Sufficiently Active   Days of Exercise per Week: 5 days   Minutes of Exercise per Session: 30 min  Stress: No Stress Concern Present   Feeling of Stress : Not at all  Social Connections: Socially Isolated   Frequency of Communication with Friends and Family: More than three times a week   Frequency of Social Gatherings with Friends and Family: More than three times a week   Attends Religious Services: Never   Marine scientist or Organizations: No   Attends Music therapist: Never   Marital Status: Never married  Human resources officer Violence: Not At Risk   Fear of Current or Ex-Partner: No   Emotionally Abused: No   Physically Abused: No   Sexually Abused: No     Current Outpatient Medications:    ACCU-CHEK GUIDE test strip, USE TO CHECK BLOOD SUGAR DAILY, Disp: 100 each, Rfl: 10   Accu-Chek Softclix Lancets lancets, Use as instructed, Disp: 100 each, Rfl: 12   albuterol (PROVENTIL) (2.5 MG/3ML) 0.083% nebulizer solution, USE 1 VIAL  VIA NEBULIZER 3 TIMES A DAY, Disp: 300 mL, Rfl: 6   Alcohol Swabs (B-D SINGLE USE SWABS REGULAR) PADS, USE AS DIRECTED FOR BLOOD SUGAR CHECKS., Disp: 100 each, Rfl: 10   asenapine (SAPHRIS) 5 MG SUBL 24 hr tablet, Place 2 tablets (10 mg total) under the tongue at bedtime., Disp: , Rfl:    atenolol (TENORMIN) 100 MG tablet, TAKE 1 TABLET BY MOUTH DAILY, Disp: 30 tablet, Rfl: 3   atorvastatin (LIPITOR) 10 MG tablet, TAKE 1 TABLET BY MOUTH DAILY, Disp: 30 tablet, Rfl: 10   cloZAPine (CLOZARIL) 100 MG tablet, Take 150 mg by mouth at bedtime., Disp: , Rfl:    diclofenac (VOLTAREN) 75 MG EC tablet, Take 1 tablet (75 mg total) by mouth 2 (two) times daily., Disp: 60 tablet, Rfl: 1   divalproex (DEPAKOTE ER) 500 MG 24 hr tablet, Take 2,000 mg by mouth at bedtime. , Disp: , Rfl:    levothyroxine  (SYNTHROID) 75 MCG tablet, TAKE 1 TABLET BY MOUTH EVERY DAY ON AN EMPTY STOMACH, Disp: 30 tablet, Rfl: 10   loratadine (CLARITIN) 10 MG tablet, TAKE 1 TABLET BY MOUTH DAILY, Disp: 30 tablet, Rfl: 10   omeprazole (PRILOSEC) 20 MG capsule, TAKE 1 CAPSULE BY MOUTH DAILY, Disp: 30 capsule, Rfl: 6   oxybutynin (DITROPAN) 5 MG tablet, Take 5 mg by mouth daily., Disp: , Rfl:    No Known Allergies  ROS Review of Systems  Constitutional: Negative.   HENT: Negative.    Eyes: Negative.   Respiratory: Negative.    Cardiovascular: Negative.   Gastrointestinal: Negative.   Endocrine: Negative.   Genitourinary: Negative.   Musculoskeletal: Negative.   Skin: Negative.   Allergic/Immunologic: Negative.   Neurological: Negative.   Hematological: Negative.   Psychiatric/Behavioral: Negative.    All other systems reviewed and are negative.    Objective:    Physical Exam Vitals reviewed.  Constitutional:      Appearance: Normal appearance.  HENT:     Mouth/Throat:     Mouth: Mucous membranes are moist.  Eyes:     Pupils: Pupils are equal, round, and reactive to light.  Neck:     Vascular: No carotid bruit.  Cardiovascular:     Rate and Rhythm: Normal rate and regular rhythm.     Pulses: Normal pulses.     Heart sounds: Normal heart sounds.  Pulmonary:     Effort: Pulmonary effort is normal.     Breath sounds: Normal breath sounds.  Abdominal:     General: Bowel sounds are normal.     Palpations: Abdomen is soft. There is no hepatomegaly, splenomegaly or mass.     Tenderness: There is no abdominal tenderness.     Hernia: No hernia is present.  Musculoskeletal:     Cervical back: Neck supple.     Right lower leg: No edema.     Left lower leg: No edema.  Skin:    Findings: No rash.  Neurological:     Mental Status: He is alert and oriented to person, place, and time.     Motor: No weakness.  Psychiatric:        Mood and Affect: Mood normal.        Behavior: Behavior normal.     BP (!) 125/96    Pulse 76    Ht 6\' 2"  (1.88 m)    Wt 286 lb 11.2 oz (130 kg)    BMI 36.81 kg/m  Wt Readings from Last 3 Encounters:  01/18/21  286 lb 11.2 oz (130 kg)  12/12/20 271 lb (122.9 kg)  11/02/20 281 lb 11.2 oz (127.8 kg)     Health Maintenance Due  Topic Date Due   Hepatitis C Screening  Never done   TETANUS/TDAP  06/24/2005   COVID-19 Vaccine (3 - Booster for Moderna series) 06/26/2019    There are no preventive care reminders to display for this patient.  Lab Results  Component Value Date   TSH 1.842 11/01/2016   Lab Results  Component Value Date   WBC 9.7 08/30/2020   HGB 11.1 (L) 08/30/2020   HCT 36.5 (L) 08/30/2020   MCV 85.1 08/30/2020   PLT 241 08/30/2020   Lab Results  Component Value Date   NA 138 08/30/2020   K 4.4 08/30/2020   CO2 32 08/30/2020   GLUCOSE 157 (H) 08/30/2020   BUN 10 08/30/2020   CREATININE 0.60 (L) 09/03/2020   BILITOT 0.9 12/21/2019   ALKPHOS 47 12/21/2019   AST 13 (L) 12/21/2019   ALT 9 12/21/2019   PROT 9.0 (H) 12/21/2019   ALBUMIN 4.0 12/21/2019   CALCIUM 8.7 (L) 08/30/2020   ANIONGAP 7 08/30/2020   Lab Results  Component Value Date   CHOL 146 11/24/2013   Lab Results  Component Value Date   HDL 22 (L) 11/24/2013   Lab Results  Component Value Date   LDLCALC 94 11/24/2013   Lab Results  Component Value Date   TRIG 152 11/24/2013   No results found for: CHOLHDL Lab Results  Component Value Date   HGBA1C 6.5 (H) 09/01/2020      Assessment & Plan:   Problem List Items Addressed This Visit       Cardiovascular and Mediastinum   Essential hypertension (Chronic)     Patient denies any chest pain or shortness of breath there is no history of palpitation or paroxysmal nocturnal dyspnea   patient was advised to follow low-salt low-cholesterol diet    ideally I want to keep systolic blood pressure below 130 mmHg, patient was asked to check blood pressure one times a week and give me a report on that.   Patient will be follow-up in 3 months  or earlier as needed, patient will call me back for any change in the cardiovascular symptoms Patient was advised to buy a book from local bookstore concerning blood pressure and read several chapters  every day.  This will be supplemented by some of the material we will give him from the office.  Patient should also utilize other resources like YouTube and Internet to learn more about the blood pressure and the diet.        Respiratory   Idiopathic sleep related nonobstructive alveolar hypoventilation    Patient was advised to lose weight        Endocrine   Diabetes mellitus ([place]) - Primary    - The patient's blood sugar is labile on med. - The patient will continue the current treatment regimen.  - I encouraged the patient to regularly check blood sugar.  - I encouraged the patient to monitor diet. I encouraged the patient to eat low-carb and low-sugar to help prevent blood sugar spikes.  - I encouraged the patient to continue following their prescribed treatment plan for diabetes - I informed the patient to get help if blood sugar drops below 54mg /dL, or if suddenly have trouble thinking clearly or breathing.  Patient was advised to buy a book on diabetes from a local bookstore or from  [place].  Patient should read 2 chapters every day to keep the motivation going, this is in addition to some of the materials we provided them from the office.  There are other resources on the Internet like YouTube and wilkipedia to get an education on the diabetes      Relevant Orders   POCT glucose (manual entry) (Completed)     Other   Paranoid schizophrenia, chronic condition ([place]) (Chronic)    Stable at the present time      Tobacco abuse    Patient stated he has stopped smoking        No orders of the defined types were placed in this encounter.   Follow-up: No follow-ups on file.    Cletis [place], MD

## 2021-01-18 NOTE — Assessment & Plan Note (Signed)

## 2021-01-18 NOTE — Assessment & Plan Note (Signed)
Patient is using CPAP machine 

## 2021-01-22 NOTE — Progress Notes (Signed)
° °  HPI: 46 y.o. male presenting today for follow-up evaluation of an acute flareup of gout to the bilateral feet.  Patient states that he continues have some pain especially to the left lower extremity.  It is painful with steps and walking.  Last visit on 12/06/2020 prescription for diclofenac 75 mg 2 times daily was prescribed.  At that time the patient was also having pain and tenderness in his knees.  I recommended that he follows up with an orthopedist.  He presents for further treatment evaluation  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 tophus 07/20/2019   Morbid obesity ([place])    Paranoid schizophrenia ([place])    Personality disorder ([place])    Sleep apnea    Thyroid disease     Past Surgical History:  Procedure Laterality Date   FRACTURE SURGERY     KNEE ARTHROSCOPY Right 08/30/2020   Procedure: ARTHROSCOPY KNEE- I&D, PARTIAL LATERAL MENISCECTOMY;  Surgeon: [name], MD;  Location: [facility] ORS;  Service: Orthopedics;  Laterality: Right;    No Known Allergies   Physical Exam: General: The patient is alert and oriented x3 in no acute distress.  Dermatology: Skin is warm, dry and supple bilateral lower extremities. Negative for open lesions or macerations.  Vascular: Palpable pedal pulses bilaterally. Capillary refill within normal limits.  Negative for any significant edema or erythema  Neurological: Light touch and protective threshold grossly intact  Musculoskeletal Exam: No pedal deformities noted  Radiographic Exam LT foot:  Otherwise normal exam.  Normal osseous mineralization.  Joint spaces preserved with exception of the beginning stages of arthritic changes to the first MTP joint with flattening of the metatarsal head.  Os peroneum also noted.  Assessment: 1.  Acute inflammatory gout bilateral lower extremities but cannot rule out Charcot neuroarthropathy   Plan of Care:  1.  Patient evaluated. X-Rays reviewed.  2.  Continue reducing activity with rest and elevation.  Continue the oral diclofenac 75 mg twice daily 3.  Follow-up appointment with orthopedist is pending according to the patient 4.  To verify that the patient does have gout I did order a uric acid test today.  The patient may want to consider long-term allopurinol medication for gout prevention.  Recommend having this discussion with the PCP  5.  Return to clinic in 8 weeks      Edrick Kins, DPM [facility]  Dr. Edrick Kins, DPM    [address], [place] [address]                Office [phone]  Fax [phone]

## 2021-02-10 ENCOUNTER — Other Ambulatory Visit: Payer: Self-pay | Admitting: Internal Medicine

## 2021-02-13 ENCOUNTER — Other Ambulatory Visit: Payer: Self-pay

## 2021-02-14 ENCOUNTER — Ambulatory Visit: Payer: 59 | Admitting: Internal Medicine

## 2021-02-15 ENCOUNTER — Encounter: Payer: Self-pay | Admitting: Internal Medicine

## 2021-02-15 ENCOUNTER — Other Ambulatory Visit: Payer: Self-pay

## 2021-02-15 ENCOUNTER — Ambulatory Visit (INDEPENDENT_AMBULATORY_CARE_PROVIDER_SITE_OTHER): Payer: 59 | Admitting: Internal Medicine

## 2021-02-15 VITALS — BP 134/84 | HR 90 | Ht 74.0 in | Wt 296.4 lb

## 2021-02-15 DIAGNOSIS — E119 Type 2 diabetes mellitus without complications: Secondary | ICD-10-CM

## 2021-02-15 DIAGNOSIS — G4734 Idiopathic sleep related nonobstructive alveolar hypoventilation: Secondary | ICD-10-CM

## 2021-02-15 DIAGNOSIS — I10 Essential (primary) hypertension: Secondary | ICD-10-CM

## 2021-02-15 LAB — GLUCOSE, POCT (MANUAL RESULT ENTRY): POC Glucose: 84 mg/dl (ref 70–99)

## 2021-02-15 NOTE — Progress Notes (Signed)
Established Patient Office Visit  Subjective:  Patient ID: John Olveda., male    DOB: January 18, 1975  Age: 46 y.o. MRN: [id number]  CC:  Chief Complaint  Patient presents with   Diabetes    Diabetes   John [name]. presents for check up   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 tophus 07/20/2019   Morbid obesity ([place])    Paranoid schizophrenia ([place])    Personality disorder ([place])    Sleep apnea    Thyroid disease     Past Surgical History:  Procedure Laterality Date   FRACTURE SURGERY     KNEE ARTHROSCOPY Right 08/30/2020   Procedure: ARTHROSCOPY KNEE- I&D, PARTIAL LATERAL MENISCECTOMY;  Surgeon: [name], MD;  Location: [facility] ORS;  Service: Orthopedics;  Laterality: Right;    Family History  Problem Relation Age of Onset   Glaucoma Mother     Social History   Socioeconomic History   Marital status: Single    Spouse name: Not on file   Number of children: Not on file   Years of education: Not on file   Highest education level: Not on file  Occupational History   Not on file  Tobacco Use   Smoking status: Former   Smokeless tobacco: Never  Vaping Use   Vaping Use: Never used  Substance and Sexual Activity   Alcohol use: Not Currently   Drug use: No   Sexual activity: Not on file  Other Topics Concern   Not on file  Social History Narrative   Not on file   Social Determinants of Health   Financial Resource Strain: Low Risk    Difficulty of Paying Living Expenses: Not hard at all  Food Insecurity: No Food Insecurity   Worried About Charity fundraiser in the Last Year: Never true   [place] in the Last Year: Never true  Transportation Needs: No Transportation Needs   Lack of Transportation (Medical): No   Lack of Transportation (Non-Medical): No  Physical Activity: Sufficiently Active   Days of Exercise per Week: 5 days   Minutes of Exercise  per Session: 30 min  Stress: No Stress Concern Present   Feeling of Stress : Not at all  Social Connections: Socially Isolated   Frequency of Communication with Friends and Family: More than three times a week   Frequency of Social Gatherings with Friends and Family: More than three times a week   Attends Religious Services: Never   Marine scientist or Organizations: No   Attends Music therapist: Never   Marital Status: Never married  Human resources officer Violence: Not At Risk   Fear of Current or Ex-Partner: No   Emotionally Abused: No   Physically Abused: No   Sexually Abused: No     Current Outpatient Medications:    ACCU-CHEK GUIDE test strip, USE TO CHECK BLOOD SUGAR DAILY, Disp: 100 each, Rfl: 10   Accu-Chek Softclix Lancets lancets, USE TO CHECK BLOOD SUGAR DAILY, Disp: 100 each, Rfl: 12   albuterol (PROVENTIL) (2.5 MG/3ML) 0.083% nebulizer solution, USE 1 VIAL VIA NEBULIZER 3 TIMES A DAY, Disp: 300 mL, Rfl: 6   Alcohol Swabs (B-D SINGLE USE SWABS REGULAR) PADS, USE AS DIRECTED FOR BLOOD SUGAR CHECKS., Disp: 100 each, Rfl: 10   asenapine (SAPHRIS) 5 MG SUBL 24 hr tablet, Place  2 tablets (10 mg total) under the tongue at bedtime., Disp: , Rfl:    atenolol (TENORMIN) 100 MG tablet, TAKE 1 TABLET BY MOUTH DAILY, Disp: 30 tablet, Rfl: 3   atorvastatin (LIPITOR) 10 MG tablet, TAKE 1 TABLET BY MOUTH DAILY, Disp: 30 tablet, Rfl: 10   cloZAPine (CLOZARIL) 100 MG tablet, Take 150 mg by mouth at bedtime., Disp: , Rfl:    diclofenac (VOLTAREN) 75 MG EC tablet, Take 1 tablet (75 mg total) by mouth 2 (two) times daily., Disp: 60 tablet, Rfl: 1   divalproex (DEPAKOTE ER) 500 MG 24 hr tablet, Take 2,000 mg by mouth at bedtime. , Disp: , Rfl:    levothyroxine (SYNTHROID) 75 MCG tablet, TAKE 1 TABLET BY MOUTH EVERY DAY ON AN EMPTY STOMACH, Disp: 30 tablet, Rfl: 10   loratadine (CLARITIN) 10 MG tablet, TAKE 1 TABLET BY MOUTH DAILY, Disp: 30 tablet, Rfl: 10   omeprazole (PRILOSEC)  20 MG capsule, TAKE 1 CAPSULE BY MOUTH DAILY, Disp: 30 capsule, Rfl: 6   oxybutynin (DITROPAN) 5 MG tablet, Take 5 mg by mouth daily., Disp: , Rfl:    No Known Allergies  ROS Review of Systems  Constitutional: Negative.   HENT: Negative.    Eyes: Negative.   Respiratory: Negative.    Cardiovascular: Negative.   Gastrointestinal: Negative.   Endocrine: Negative.   Genitourinary: Negative.   Musculoskeletal: Negative.   Skin: Negative.   Allergic/Immunologic: Negative.   Neurological: Negative.   Hematological: Negative.   Psychiatric/Behavioral: Negative.    All other systems reviewed and are negative.    Objective:    Physical Exam Vitals reviewed.  Constitutional:      Appearance: Normal appearance.  HENT:     Mouth/Throat:     Mouth: Mucous membranes are moist.  Eyes:     Pupils: Pupils are equal, round, and reactive to light.  Neck:     Vascular: No carotid bruit.  Cardiovascular:     Rate and Rhythm: Normal rate and regular rhythm.     Pulses: Normal pulses.     Heart sounds: Normal heart sounds.  Pulmonary:     Effort: Pulmonary effort is normal.     Breath sounds: Normal breath sounds.  Abdominal:     General: Bowel sounds are normal.     Palpations: Abdomen is soft. There is no hepatomegaly, splenomegaly or mass.     Tenderness: There is no abdominal tenderness.     Hernia: No hernia is present.  Musculoskeletal:     Cervical back: Neck supple.     Right lower leg: No edema.     Left lower leg: No edema.  Skin:    Findings: No rash.  Neurological:     Mental Status: He is alert and oriented to person, place, and time.     Motor: No weakness.  Psychiatric:        Mood and Affect: Mood normal.        Behavior: Behavior normal.    BP 134/84    Pulse 90    Ht 6\' 2"  (1.88 m)    Wt 296 lb 6.4 oz (134.4 kg)    BMI 38.06 kg/m  Wt Readings from Last 3 Encounters:  02/15/21 296 lb 6.4 oz (134.4 kg)  01/18/21 286 lb 11.2 oz (130 kg)  12/12/20 271 lb  (122.9 kg)     Health Maintenance Due  Topic Date Due   Hepatitis C Screening  Never done   TETANUS/TDAP  06/24/2005  COVID-19 Vaccine (3 - Booster for Moderna series) 06/26/2019   OPHTHALMOLOGY EXAM  02/01/2021    There are no preventive care reminders to display for this patient.  Lab Results  Component Value Date   TSH 1.842 11/01/2016   Lab Results  Component Value Date   WBC 9.7 08/30/2020   HGB 11.1 (L) 08/30/2020   HCT 36.5 (L) 08/30/2020   MCV 85.1 08/30/2020   PLT 241 08/30/2020   Lab Results  Component Value Date   NA 138 08/30/2020   K 4.4 08/30/2020   CO2 32 08/30/2020   GLUCOSE 157 (H) 08/30/2020   BUN 10 08/30/2020   CREATININE 0.60 (L) 09/03/2020   BILITOT 0.9 12/21/2019   ALKPHOS 47 12/21/2019   AST 13 (L) 12/21/2019   ALT 9 12/21/2019   PROT 9.0 (H) 12/21/2019   ALBUMIN 4.0 12/21/2019   CALCIUM 8.7 (L) 08/30/2020   ANIONGAP 7 08/30/2020   Lab Results  Component Value Date   CHOL 146 11/24/2013   Lab Results  Component Value Date   HDL 22 (L) 11/24/2013   Lab Results  Component Value Date   LDLCALC 94 11/24/2013   Lab Results  Component Value Date   TRIG 152 11/24/2013   No results found for: CHOLHDL Lab Results  Component Value Date   HGBA1C 6.5 (H) 09/01/2020      Assessment & Plan:   Problem List Items Addressed This Visit       Cardiovascular and Mediastinum   Essential hypertension (Chronic)    The following hypertensive lifestyle modification were recommended and discussed:  1. Limiting alcohol intake to less than 1 oz/day of ethanol:(24 oz of beer or 8 oz of wine or 2 oz of 100-proof whiskey). 2. Take baby ASA 81 mg daily. 3. Importance of regular aerobic exercise and losing weight. 4. Reduce dietary saturated fat and cholesterol intake for overall cardiovascular health. 5. Maintaining adequate dietary potassium, calcium, and magnesium intake. 6. Regular monitoring of the blood pressure. 7. Reduce sodium intake to  less than 100 mmol/day (less than 2.3 gm of sodium or less than 6 gm of sodium choride)         Respiratory   Sleep apnea    Patient is using CPAP machine        Endocrine   Diabetes mellitus ([place]) - Primary     Check BG before breakfast and before supper every day  Bring BG reports to each appointment - call anytime with questions  Eat 3 balanced meals/day and space meals 3-5 hours apart  Include 1 serving of protein and 2-3 servings of carbohydrates with meals  Don't skip meals - eat at least 1 protein and 1 carbohydrate serving  Limit fried foods and desserts/sweets  Avoid sugar sweetened drinks (soda, juice)  Increase water intake to 2-3 servings/day  Complete 3 Day Food Record and bring to next appointment  Carry fast acting glucose and a snack at all times  Continue walking for 20 minutes 3 x week and gradually increase to 30 minutes 5 x week      Relevant Orders   POCT glucose (manual entry) (Completed)    No orders of the defined types were placed in this encounter.   Follow-up: No follow-ups on file.    Cletis [place], MD

## 2021-02-15 NOTE — Assessment & Plan Note (Signed)
Patient is using CPAP machine 

## 2021-02-15 NOTE — Assessment & Plan Note (Signed)

## 2021-02-15 NOTE — Assessment & Plan Note (Signed)
·   Check BG before breakfast and before supper every day  Bring BG reports to each appointment - call anytime with questions  Eat 3 balanced meals/day and space meals 3-5 hours apart  Include 1 serving of protein and 2-3 servings of carbohydrates with meals  Don't skip meals - eat at least 1 protein and 1 carbohydrate serving  Limit fried foods and desserts/sweets  Avoid sugar sweetened drinks (soda, juice)  Increase water intake to 2-3 servings/day  Complete 3 Day Food Record and bring to next appointment  Carry fast acting glucose and a snack at all times  Continue walking for 20 minutes 3 x week and gradually increase to 30 minutes 5 x week

## 2021-02-23 ENCOUNTER — Other Ambulatory Visit: Payer: Self-pay | Admitting: Internal Medicine

## 2021-03-03 ENCOUNTER — Other Ambulatory Visit: Payer: Self-pay | Admitting: Podiatry

## 2021-03-30 ENCOUNTER — Other Ambulatory Visit: Payer: Self-pay

## 2021-03-30 MED ORDER — ALBUTEROL SULFATE (2.5 MG/3ML) 0.083% IN NEBU: INHALATION_SOLUTION | RESPIRATORY_TRACT | 6 refills | Status: DC

## 2021-04-12 ENCOUNTER — Encounter: Payer: Self-pay | Admitting: Internal Medicine

## 2021-04-12 ENCOUNTER — Ambulatory Visit (INDEPENDENT_AMBULATORY_CARE_PROVIDER_SITE_OTHER): Payer: 59 | Admitting: Internal Medicine

## 2021-04-12 VITALS — BP 134/86 | HR 84 | Ht 74.0 in | Wt 293.4 lb

## 2021-04-12 DIAGNOSIS — F200 Paranoid schizophrenia: Secondary | ICD-10-CM | POA: Diagnosis not present

## 2021-04-12 DIAGNOSIS — G4734 Idiopathic sleep related nonobstructive alveolar hypoventilation: Secondary | ICD-10-CM

## 2021-04-12 DIAGNOSIS — I10 Essential (primary) hypertension: Secondary | ICD-10-CM

## 2021-04-12 DIAGNOSIS — E119 Type 2 diabetes mellitus without complications: Secondary | ICD-10-CM

## 2021-04-12 DIAGNOSIS — E785 Hyperlipidemia, unspecified: Secondary | ICD-10-CM

## 2021-04-12 DIAGNOSIS — E669 Obesity, unspecified: Secondary | ICD-10-CM

## 2021-04-12 LAB — GLUCOSE, POCT (MANUAL RESULT ENTRY): POC Glucose: 98 mg/dl (ref 70–99)

## 2021-04-12 NOTE — Progress Notes (Signed)
? ?Established Patient Office Visit ? ?Subjective:  ?Patient ID: John [name]., male    DOB: [birth year]  Age: 46 y.o. MRN: [id number] ? ?CC:  ?Chief Complaint  ?Patient presents with  ? Diabetes  ?  Patient is here for a follow up  ? ? ?Diabetes ? ? ?John [name]. presents for general check up, patient is and he is not using the CPAP machine regularly right now, I told him the importance of using CPAP machine.  He was also advised to lose weight.  He does not smoke ? ?Past Medical History:  ?Diagnosis Date  ? Anemia   ? Asthma   ? Diabetes mellitus   ? Idiopathic chronic gout of right foot without tophus 07/20/2019  ? Morbid obesity ([place])   ? Paranoid schizophrenia ([place])   ? Personality disorder ([place])   ? Sleep apnea   ? Thyroid disease   ? ? ?Past Surgical History:  ?Procedure Laterality Date  ? FRACTURE SURGERY    ? KNEE ARTHROSCOPY Right 08/30/2020  ? Procedure: ARTHROSCOPY KNEE- I&D, PARTIAL LATERAL MENISCECTOMY;  Surgeon: [name], MD;  Location: [facility] ORS;  Service: Orthopedics;  Laterality: Right;  ? ? ?Family History  ?Problem Relation Age of Onset  ? Glaucoma Mother   ? ? ?Social History  ? ?Socioeconomic History  ? Marital status: Single  ?  Spouse name: Not on file  ? Number of children: Not on file  ? Years of education: Not on file  ? Highest education level: Not on file  ?Occupational History  ? Not on file  ?Tobacco Use  ? Smoking status: Former  ? Smokeless tobacco: Never  ?Vaping Use  ? Vaping Use: Never used  ?Substance and Sexual Activity  ? Alcohol use: Not Currently  ? Drug use: No  ? Sexual activity: Not on file  ?Other Topics Concern  ? Not on file  ?Social History Narrative  ? Not on file  ? ?Social Determinants of Health  ? ?Financial Resource Strain: Low Risk   ? Difficulty of Paying Living Expenses: Not hard at all  ?Food Insecurity: No Food Insecurity  ? Worried About Charity fundraiser in the Last Year: Never true  ? Ran Out of Food in the Last Year: Never true  ?Transportation  Needs: No Transportation Needs  ? Lack of Transportation (Medical): No  ? Lack of Transportation (Non-Medical): No  ?Physical Activity: Sufficiently Active  ? Days of Exercise per Week: 5 days  ? Minutes of Exercise per Session: 30 min  ?Stress: No Stress Concern Present  ? Feeling of Stress : Not at all  ?Social Connections: Socially Isolated  ? Frequency of Communication with Friends and Family: More than three times a week  ? Frequency of Social Gatherings with Friends and Family: More than three times a week  ? Attends Religious Services: Never  ? Active Member of Clubs or Organizations: No  ? Attends Archivist Meetings: Never  ? Marital Status: Never married  ?Intimate Partner Violence: Not At Risk  ? Fear of Current or Ex-Partner: No  ? Emotionally Abused: No  ? Physically Abused: No  ? Sexually Abused: No  ? ? ? ?Current Outpatient Medications:  ?  ACCU-CHEK GUIDE test strip, USE TO CHECK BLOOD SUGAR DAILY, Disp: 100 each, Rfl: 10 ?  Accu-Chek Softclix Lancets lancets, USE TO CHECK BLOOD SUGAR DAILY, Disp: 100 each, Rfl: 12 ?  albuterol (PROVENTIL) (2.5 MG/3ML) 0.083% nebulizer solution, USE 1  VIAL VIA NEBULIZER 3 TIMES A DAY, Disp: 300 mL, Rfl: 6 ?  Alcohol Swabs (B-D SINGLE USE SWABS REGULAR) PADS, USE AS DIRECTED FOR BLOOD SUGAR CHECKS., Disp: 100 each, Rfl: 10 ?  asenapine (SAPHRIS) 5 MG SUBL 24 hr tablet, Place 2 tablets (10 mg total) under the tongue at bedtime., Disp: , Rfl:  ?  atenolol (TENORMIN) 100 MG tablet, TAKE 1 TABLET BY MOUTH DAILY, Disp: 30 tablet, Rfl: 3 ?  atorvastatin (LIPITOR) 10 MG tablet, TAKE 1 TABLET BY MOUTH DAILY, Disp: 30 tablet, Rfl: 10 ?  cloZAPine (CLOZARIL) 100 MG tablet, Take 150 mg by mouth at bedtime., Disp: , Rfl:  ?  diclofenac (VOLTAREN) 75 MG EC tablet, TAKE 1 TABLET BY MOUTH 2 TIMES DAILY, Disp: 60 tablet, Rfl: 1 ?  divalproex (DEPAKOTE ER) 500 MG 24 hr tablet, Take 2,000 mg by mouth at bedtime. , Disp: , Rfl:  ?  levothyroxine (SYNTHROID) 75 MCG tablet,  TAKE 1 TABLET BY MOUTH EVERY DAY ON AN EMPTY STOMACH, Disp: 30 tablet, Rfl: 10 ?  loratadine (CLARITIN) 10 MG tablet, TAKE 1 TABLET BY MOUTH DAILY, Disp: 30 tablet, Rfl: 10 ?  omeprazole (PRILOSEC) 20 MG capsule, TAKE 1 CAPSULE BY MOUTH DAILY, Disp: 30 capsule, Rfl: 6 ?  oxybutynin (DITROPAN) 5 MG tablet, Take 5 mg by mouth daily., Disp: , Rfl:   ? ?No Known Allergies ? ?ROS ?Review of Systems  ?Constitutional: Negative.   ?HENT: Negative.    ?Eyes: Negative.   ?Respiratory: Negative.    ?Cardiovascular: Negative.   ?Gastrointestinal: Negative.   ?Endocrine: Negative.   ?Genitourinary: Negative.   ?Musculoskeletal: Negative.   ?Skin: Negative.   ?Allergic/Immunologic: Negative.   ?Neurological: Negative.   ?Hematological: Negative.   ?Psychiatric/Behavioral: Negative.    ?All other systems reviewed and are negative. ? ?  ?Objective:  ?  ?Physical Exam ?Vitals reviewed.  ?Constitutional:   ?   Appearance: Normal appearance.  ?HENT:  ?   Mouth/Throat:  ?   Mouth: Mucous membranes are moist.  ?Eyes:  ?   Pupils: Pupils are equal, round, and reactive to light.  ?Neck:  ?   Vascular: No carotid bruit.  ?Cardiovascular:  ?   Rate and Rhythm: Normal rate and regular rhythm.  ?   Pulses: Normal pulses.  ?   Heart sounds: Normal heart sounds.  ?Pulmonary:  ?   Effort: Pulmonary effort is normal.  ?   Breath sounds: Normal breath sounds.  ?Abdominal:  ?   General: Bowel sounds are normal.  ?   Palpations: Abdomen is soft. There is no hepatomegaly, splenomegaly or mass.  ?   Tenderness: There is no abdominal tenderness.  ?   Hernia: No hernia is present.  ?Musculoskeletal:  ?   Cervical back: Neck supple.  ?   Right lower leg: No edema.  ?   Left lower leg: No edema.  ?Skin: ?   Findings: No rash.  ?Neurological:  ?   Mental Status: He is alert and oriented to person, place, and time.  ?   Motor: No weakness.  ?Psychiatric:     ?   Mood and Affect: Mood normal.     ?   Behavior: Behavior normal.  ? ? ?BP 134/86   Pulse 84    Ht '6\' 2"'$  (1.88 m)   Wt 293 lb 6.4 oz (133.1 kg)   BMI 37.67 kg/m?  ?Wt Readings from Last 3 Encounters:  ?04/12/21 293 lb 6.4 oz (133.1 kg)  ?02/15/21  296 lb 6.4 oz (134.4 kg)  ?01/18/21 286 lb 11.2 oz (130 kg)  ? ? ? ?Health Maintenance Due  ?Topic Date Due  ? Hepatitis C Screening  Never done  ? TETANUS/TDAP  06/24/2005  ? COVID-19 Vaccine (3 - Booster for Moderna series) 06/26/2019  ? OPHTHALMOLOGY EXAM  02/01/2021  ? HEMOGLOBIN A1C  03/01/2021  ? ? ?There are no preventive care reminders to display for this patient. ? ?Lab Results  ?Component Value Date  ? TSH 1.842 11/01/2016  ? ?Lab Results  ?Component Value Date  ? WBC 9.7 08/30/2020  ? HGB 11.1 (L) 08/30/2020  ? HCT 36.5 (L) 08/30/2020  ? MCV 85.1 08/30/2020  ? PLT 241 08/30/2020  ? ?Lab Results  ?Component Value Date  ? NA 138 08/30/2020  ? K 4.4 08/30/2020  ? CO2 32 08/30/2020  ? GLUCOSE 157 (H) 08/30/2020  ? BUN 10 08/30/2020  ? CREATININE 0.60 (L) 09/03/2020  ? BILITOT 0.9 12/21/2019  ? ALKPHOS 47 12/21/2019  ? AST 13 (L) 12/21/2019  ? ALT 9 12/21/2019  ? PROT 9.0 (H) 12/21/2019  ? ALBUMIN 4.0 12/21/2019  ? CALCIUM 8.7 (L) 08/30/2020  ? ANIONGAP 7 08/30/2020  ? ?Lab Results  ?Component Value Date  ? CHOL 146 11/24/2013  ? ?Lab Results  ?Component Value Date  ? HDL 22 (L) 11/24/2013  ? ?Lab Results  ?Component Value Date  ? [place] 94 11/24/2013  ? ?Lab Results  ?Component Value Date  ? TRIG 152 11/24/2013  ? ?No results found for: CHOLHDL ?Lab Results  ?Component Value Date  ? HGBA1C 6.5 (H) 09/01/2020  ? ? ?  ?Assessment & Plan:  ? ?Problem List Items Addressed This Visit   ? ?  ? Cardiovascular and Mediastinum  ? Essential hypertension (Chronic)  ?  Blood pressure stable at the present time ?  ?  ?  ? Respiratory  ? Sleep apnea  ?  Patient was advised to use the CPAP machine ?  ?  ?  ? Endocrine  ? Diabetes mellitus ([place]) - Primary  ?  - The patient's blood sugar is labile on med. ?- The patient will continue the current treatment regimen.  ?- I  encouraged the patient to regularly check blood sugar.  ?- I encouraged the patient to monitor diet. I encouraged the patient to eat low-carb and low-sugar to help prevent blood sugar spikes.  ?- I encouraged t

## 2021-04-12 NOTE — Assessment & Plan Note (Signed)

## 2021-04-12 NOTE — Assessment & Plan Note (Signed)
Hypercholesterolemia  I advised the patient to follow Mediterranean diet This diet is rich in fruits vegetables and whole grain, and This diet is also rich in fish and lean meat Patient should also eat a handful of almonds or walnuts daily Recent heart study indicated that average follow-up on this kind of diet reduces the cardiovascular mortality by 50 to 70%== 

## 2021-04-12 NOTE — Assessment & Plan Note (Signed)
Blood pressure stable at the present time 

## 2021-04-12 NOTE — Assessment & Plan Note (Signed)
Stable

## 2021-04-12 NOTE — Assessment & Plan Note (Signed)

## 2021-04-12 NOTE — Assessment & Plan Note (Signed)
Patient was advised to use the CPAP machine 

## 2021-04-27 ENCOUNTER — Other Ambulatory Visit: Payer: Self-pay | Admitting: Internal Medicine

## 2021-05-25 ENCOUNTER — Other Ambulatory Visit: Payer: Self-pay | Admitting: Internal Medicine

## 2021-06-05 ENCOUNTER — Encounter: Payer: Self-pay | Admitting: *Deleted

## 2021-06-05 ENCOUNTER — Emergency Department: Payer: 59

## 2021-06-05 ENCOUNTER — Other Ambulatory Visit: Payer: Self-pay

## 2021-06-05 ENCOUNTER — Inpatient Hospital Stay
Admission: EM | Admit: 2021-06-05 | Discharge: 2021-06-12 | DRG: 553 | Disposition: A | Discharge status: Home Health | Payer: 59 | Attending: Osteopathic Medicine | Admitting: Osteopathic Medicine

## 2021-06-05 DIAGNOSIS — Z20822 Contact with and (suspected) exposure to covid-19: Secondary | ICD-10-CM | POA: Diagnosis present

## 2021-06-05 DIAGNOSIS — D649 Anemia, unspecified: Secondary | ICD-10-CM | POA: Diagnosis present

## 2021-06-05 DIAGNOSIS — M109 Gout, unspecified: Secondary | ICD-10-CM

## 2021-06-05 DIAGNOSIS — M79605 Pain in left leg: Secondary | ICD-10-CM

## 2021-06-05 DIAGNOSIS — I10 Essential (primary) hypertension: Secondary | ICD-10-CM | POA: Diagnosis present

## 2021-06-05 DIAGNOSIS — J45909 Unspecified asthma, uncomplicated: Secondary | ICD-10-CM | POA: Diagnosis present

## 2021-06-05 DIAGNOSIS — I272 Pulmonary hypertension, unspecified: Secondary | ICD-10-CM | POA: Diagnosis present

## 2021-06-05 DIAGNOSIS — Z79899 Other long term (current) drug therapy: Secondary | ICD-10-CM

## 2021-06-05 DIAGNOSIS — Z9989 Dependence on other enabling machines and devices: Secondary | ICD-10-CM

## 2021-06-05 DIAGNOSIS — F200 Paranoid schizophrenia: Secondary | ICD-10-CM | POA: Diagnosis present

## 2021-06-05 DIAGNOSIS — M009 Pyogenic arthritis, unspecified: Secondary | ICD-10-CM | POA: Diagnosis present

## 2021-06-05 DIAGNOSIS — N179 Acute kidney failure, unspecified: Secondary | ICD-10-CM | POA: Diagnosis present

## 2021-06-05 DIAGNOSIS — S5002XA Contusion of left elbow, initial encounter: Secondary | ICD-10-CM

## 2021-06-05 DIAGNOSIS — M79602 Pain in left arm: Secondary | ICD-10-CM

## 2021-06-05 DIAGNOSIS — R6511 Systemic inflammatory response syndrome (SIRS) of non-infectious origin with acute organ dysfunction: Secondary | ICD-10-CM

## 2021-06-05 DIAGNOSIS — I82622 Acute embolism and thrombosis of deep veins of left upper extremity: Secondary | ICD-10-CM

## 2021-06-05 DIAGNOSIS — R6510 Systemic inflammatory response syndrome (SIRS) of non-infectious origin without acute organ dysfunction: Secondary | ICD-10-CM | POA: Diagnosis present

## 2021-06-05 DIAGNOSIS — I429 Cardiomyopathy, unspecified: Secondary | ICD-10-CM | POA: Diagnosis present

## 2021-06-05 DIAGNOSIS — Z7984 Long term (current) use of oral hypoglycemic drugs: Secondary | ICD-10-CM

## 2021-06-05 DIAGNOSIS — M10062 Idiopathic gout, left knee: Secondary | ICD-10-CM | POA: Diagnosis not present

## 2021-06-05 DIAGNOSIS — E039 Hypothyroidism, unspecified: Secondary | ICD-10-CM | POA: Diagnosis present

## 2021-06-05 DIAGNOSIS — A419 Sepsis, unspecified organism: Secondary | ICD-10-CM | POA: Diagnosis present

## 2021-06-05 DIAGNOSIS — G4733 Obstructive sleep apnea (adult) (pediatric): Secondary | ICD-10-CM

## 2021-06-05 DIAGNOSIS — Z87891 Personal history of nicotine dependence: Secondary | ICD-10-CM

## 2021-06-05 DIAGNOSIS — Z79890 Hormone replacement therapy (postmenopausal): Secondary | ICD-10-CM

## 2021-06-05 DIAGNOSIS — E119 Type 2 diabetes mellitus without complications: Secondary | ICD-10-CM

## 2021-06-05 DIAGNOSIS — F609 Personality disorder, unspecified: Secondary | ICD-10-CM | POA: Diagnosis present

## 2021-06-05 DIAGNOSIS — Z91199 Patient's noncompliance with other medical treatment and regimen due to unspecified reason: Secondary | ICD-10-CM

## 2021-06-05 LAB — TSH: TSH: 5.613 u[IU]/mL — ABNORMAL HIGH (ref 0.350–4.500)

## 2021-06-05 LAB — TROPONIN I (HIGH SENSITIVITY)
Troponin I (High Sensitivity): 3 ng/L (ref <18)
Troponin I (High Sensitivity): 4 ng/L (ref <18)

## 2021-06-05 LAB — BASIC METABOLIC PANEL
Anion gap: 9 (ref 5–15)
BUN: 22 mg/dL — ABNORMAL HIGH (ref 6–20)
CO2: 26 mmol/L (ref 22–32)
Calcium: 8.8 mg/dL — ABNORMAL LOW (ref 8.9–10.3)
Chloride: 98 mmol/L (ref 98–111)
Creatinine, Ser: 1.28 mg/dL — ABNORMAL HIGH (ref 0.61–1.24)
GFR, Estimated: >60 mL/min (ref >60)
Glucose, Bld: 158 mg/dL — ABNORMAL HIGH (ref 70–99)
Potassium: 4 mmol/L (ref 3.5–5.1)
Sodium: 133 mmol/L — ABNORMAL LOW (ref 135–145)

## 2021-06-05 LAB — LIPASE, BLOOD: Lipase: 24 U/L (ref 11–51)

## 2021-06-05 LAB — HEPATIC FUNCTION PANEL
ALT: 9 U/L (ref 0–44)
AST: 13 U/L — ABNORMAL LOW (ref 15–41)
Albumin: 3.3 g/dL — ABNORMAL LOW (ref 3.5–5.0)
Alkaline Phosphatase: 42 U/L (ref 38–126)
Bilirubin, Direct: <0.1 mg/dL (ref 0.0–0.2)
Total Bilirubin: 0.6 mg/dL (ref 0.3–1.2)
Total Protein: 8.3 g/dL — ABNORMAL HIGH (ref 6.5–8.1)

## 2021-06-05 LAB — LACTIC ACID, PLASMA: Lactic Acid, Venous: 2.4 mmol/L (ref 0.5–1.9)

## 2021-06-05 LAB — CBC
HCT: 38.6 % — ABNORMAL LOW (ref 39.0–52.0)
Hemoglobin: 11.8 g/dL — ABNORMAL LOW (ref 13.0–17.0)
MCH: 25.9 pg — ABNORMAL LOW (ref 26.0–34.0)
MCHC: 30.6 g/dL (ref 30.0–36.0)
MCV: 84.6 fL (ref 80.0–100.0)
Platelets: 196 10*3/uL (ref 150–400)
RBC: 4.56 MIL/uL (ref 4.22–5.81)
RDW: 13.9 % (ref 11.5–15.5)
WBC: 9 10*3/uL (ref 4.0–10.5)
nRBC: 0 % (ref 0.0–0.2)

## 2021-06-05 MED ORDER — SODIUM CHLORIDE 0.9 % IV BOLUS
1000.0000 mL | Freq: Once | INTRAVENOUS | Status: AC
  Administered 2021-06-05: 1000 mL via INTRAVENOUS

## 2021-06-05 MED ORDER — SODIUM CHLORIDE 0.9 % IV SOLN
2.0000 g | INTRAVENOUS | Status: DC
  Administered 2021-06-05 – 2021-06-07 (×3): 2 g via INTRAVENOUS
  Filled 2021-06-05 (×3): qty 20

## 2021-06-05 MED ORDER — SODIUM CHLORIDE 0.9 % IV BOLUS (SEPSIS)
1000.0000 mL | Freq: Once | INTRAVENOUS | Status: AC
  Administered 2021-06-05: 1000 mL via INTRAVENOUS

## 2021-06-05 MED ORDER — ONDANSETRON HCL 4 MG/2ML IJ SOLN
4.0000 mg | Freq: Once | INTRAMUSCULAR | Status: AC
  Administered 2021-06-05: 4 mg via INTRAVENOUS
  Filled 2021-06-05: qty 2

## 2021-06-05 MED ORDER — ACETAMINOPHEN 500 MG PO TABS
ORAL_TABLET | ORAL | Status: AC
  Filled 2021-06-05: qty 2

## 2021-06-05 MED ORDER — LIDOCAINE HCL (PF) 1 % IJ SOLN
30.0000 mL | Freq: Once | INTRAMUSCULAR | Status: AC
  Filled 2021-06-05: qty 30

## 2021-06-05 MED ORDER — ACETAMINOPHEN 500 MG PO TABS
1000.0000 mg | ORAL_TABLET | Freq: Once | ORAL | Status: AC
  Administered 2021-06-05: 1000 mg via ORAL

## 2021-06-05 NOTE — ED Triage Notes (Signed)
Pt brought in via ems from a group home.  Pt reports left arm pain for 3 days.  No chest pain or sob.  Pt also has left knee pain .  No known injury .  Pt alert.

## 2021-06-05 NOTE — ED Provider Notes (Signed)
[facility] Provider Note    Event Date/Time   First MD Initiated Contact with Patient 06/05/21 2136     (approximate)   History   Weakness   HPI  John [name]. is a 46 y.o. male with past medical history of anemia, asthma, DM, HTN, HDL, cardiomyopathy gout, obesity, schizophrenia, OSA and thyroid disease who presents via EMS from group home for evaluation of several days of weakness and left arm pain.  Patient states he thinks it started about 3 days ago.  He denies any injuries or falls or right-sided symptoms.  He denies any chest pain, cough, headache, back pain, abdominal pain, nausea, vomiting diarrhea or urinary symptoms.  He denies any rashes or insect bites.  Denies any recent travel outside [place].  Denies any prior similar episodes are clearly present improving factors.  Patient's mother's phone number [phone]   Physical Exam  Triage Vital Signs: ED Triage Vitals  Enc Vitals Group     BP 06/05/21 2111 107/70     Pulse Rate 06/05/21 2111 (!) 117     Resp 06/05/21 2111 (!) 22     Temp 06/05/21 2111 (!) 101.4 F (38.6 C)     Temp Source 06/05/21 2111 Oral     SpO2 06/05/21 2111 94 %     Weight 06/05/21 2108 293 lb (132.9 kg)     Height 06/05/21 2108 '6\' 2"'$  (1.88 m)     Head Circumference --      Peak Flow --      Pain Score 06/05/21 2108 8     Pain Loc --      Pain Edu? --      Excl. in [place]? --     Most recent vital signs: Vitals:   06/06/21 0120 06/06/21 0300  BP: 127/75 113/77  Pulse: (!) 106 (!) 102  Resp: 19 18  Temp:    SpO2: 94% 95%    General: Awake, uncomfortable appearing. CV:  Good peripheral perfusion.  Tachycardic.  2+ radial pulses. Resp:  Normal effort.  Clear bilaterally. Abd:  No distention.  Soft. Other:  Sensation is intact in the distribution of the radial ulnar and median nerves in the bile upper extremities and throughout the left and right lower extremity to light touch.  Patient does seem  globally weaker in the left arm compared to the right arm only states it is sore everywhere including from the elbow to the wrist during the exam.  He also has some weakness and pain with range of motion of the left knee compared to the right.  The left knee is more erythematous slightly more edematous than the right.  No significant overlying changes over the foot or ankle or proximal to the knee.   ED Results / Procedures / Treatments  Labs (all labs ordered are listed, but only abnormal results are displayed) Labs Reviewed  BASIC METABOLIC PANEL - Abnormal; Notable for the following components:      Result Value   Sodium 133 (*)    Glucose, Bld 158 (*)    BUN 22 (*)    Creatinine, Ser 1.28 (*)    Calcium 8.8 (*)    All other components within normal limits  CBC - Abnormal; Notable for the following components:   Hemoglobin 11.8 (*)    HCT 38.6 (*)    MCH 25.9 (*)    All other components within normal limits  LACTIC ACID, PLASMA - Abnormal; Notable  for the following components:   Lactic Acid, Venous 2.4 (*)    All other components within normal limits  LACTIC ACID, PLASMA - Abnormal; Notable for the following components:   Lactic Acid, Venous 2.1 (*)    All other components within normal limits  URINALYSIS, COMPLETE (UACMP) WITH MICROSCOPIC - Abnormal; Notable for the following components:   Color, Urine YELLOW (*)    APPearance CLEAR (*)    All other components within normal limits  HEPATIC FUNCTION PANEL - Abnormal; Notable for the following components:   Total Protein 8.3 (*)    Albumin 3.3 (*)    AST 13 (*)    All other components within normal limits  TSH - Abnormal; Notable for the following components:   TSH 5.613 (*)    All other components within normal limits  MAGNESIUM - Abnormal; Notable for the following components:   Magnesium 1.6 (*)    All other components within normal limits  SYNOVIAL CELL COUNT + DIFF, W/ CRYSTALS - Abnormal; Notable for the following  components:   Appearance-Synovial HAZY (*)    WBC, Synovial 72,980 (*)    All other components within normal limits  SARS CORONAVIRUS 2 BY RT PCR  CULTURE, BLOOD (ROUTINE X 2)  CULTURE, BLOOD (ROUTINE X 2)  URINE CULTURE  BODY FLUID CULTURE W GRAM STAIN  LIPASE, BLOOD  PROCALCITONIN  T4, FREE  CK  VALPROIC ACID LEVEL  PROCALCITONIN  GLUCOSE, BODY FLUID OTHER            PROTEIN, BODY FLUID (OTHER)  TROPONIN I (HIGH SENSITIVITY)  TROPONIN I (HIGH SENSITIVITY)     EKG  ECG is remarkable for sinus tachycardia with ventricular rate of 120 with normal axis, unremarkable intervals without clear evidence of acute ischemia or significant arrhythmia.   RADIOLOGY Chest x-ray is remarkable for some increased opacification around the perihilar regions without overt edema, clear focal consolidation, pneumothorax or large effusion.  I also reviewed radiology's interpretation.  X-ray of the left knee on my interpretation shows some osteoarthritis and small joint effusion without any fracture or other acute process.  I also reviewed radiology's interpretation.  X-ray of the left elbow, wrist and shoulder, interpretation without any large or displaced fractures.  Reviewed radiology capitation and agree with findings of possible small left radial head fracture age-indeterminate and some degenerative changes without other acute process.  CT of the left elbow on my interpretation without evidence of fracture or dislocation.  I also reviewed radiology interpretation and agree with the findings of small hematoma superficial to the olecranon process without other acute process.  LUE [place] on my interpretation w/o evidence of occlusive DVT but there is a non-occlusive thrombus in the cephalic vein. I also reviewed radiology's interpretation.    PROCEDURES:  Critical Care performed: Yes, see critical care procedure note(s)  .Critical Care Performed by: Lucrezia Starch, MD Authorized by: Lucrezia Starch, MD   Critical care provider statement:    Critical care time (minutes):  30   Critical care was necessary to treat or prevent imminent or life-threatening deterioration of the following conditions:  Sepsis   Critical care was time spent personally by me on the following activities:  Development of treatment plan with patient or surrogate, discussions with consultants, evaluation of patient's response to treatment, examination of patient, ordering and review of laboratory studies, ordering and review of radiographic studies, ordering and performing treatments and interventions, pulse oximetry, re-evaluation of patient's condition and review of old  charts .Joint Aspiration/Arthrocentesis  Date/Time: 06/06/2021 1:24 AM Performed by: Lucrezia Starch, MD Authorized by: Lucrezia Starch, MD   Consent:    Consent obtained:  Verbal   Consent given by:  Patient (unable to reach mother by phone x2)   Risks discussed:  Bleeding, infection, pain, poor cosmetic result, nerve damage and incomplete drainage   Alternatives discussed:  Delayed treatment Universal protocol:    Patient identity confirmed:  Verbally with patient Location:    Location:  Knee Anesthesia:    Anesthesia method:  Local infiltration   Local anesthetic:  Lidocaine 1% w/o epi Procedure details:    Approach:  Medial   Aspirate characteristics:  Blood-tinged and cloudy   Steroid injected: no     Specimen collected: no   Post-procedure details:    Dressing:  Adhesive bandage   Procedure completion:  Tolerated well, no immediate complications    MEDICATIONS ORDERED IN ED: Medications  cefTRIAXone (ROCEPHIN) 2 g in sodium chloride 0.9 % 100 mL IVPB (0 g Intravenous Stopped 06/05/21 2339)  vancomycin (VANCOCIN) IVPB 1000 mg/200 mL premix (has no administration in time range)    Followed by  vancomycin (VANCOREADY) IVPB 1500 mg/300 mL (has no administration in time range)  lactated ringers infusion (has no  administration in time range)  acetaminophen (TYLENOL) tablet 1,000 mg (1,000 mg Oral Given 06/05/21 2122)  sodium chloride 0.9 % bolus 1,000 mL (0 mLs Intravenous Stopped 06/06/21 0006)  sodium chloride 0.9 % bolus 1,000 mL (0 mLs Intravenous Stopped 06/06/21 0033)  ondansetron (ZOFRAN) injection 4 mg (4 mg Intravenous Given 06/05/21 2247)  lidocaine (PF) (XYLOCAINE) 1 % injection 30 mL (1 mL Infiltration Given 06/06/21 0017)  magnesium sulfate IVPB 2 g 50 mL ( Intravenous Restarted 06/06/21 0315)     IMPRESSION / MDM / [place] / ED COURSE  I reviewed the triage vital signs and the nursing notes. Patient's presentation is most consistent with acute presentation with potential threat to life or bodily function.                               Differential diagnosis includes, but is not limited to septic arthritis, cellulitis,  ECG is remarkable for sinus tachycardia with ventricular rate of 120 with normal axis, unremarkable intervals without clear evidence of acute ischemia or significant arrhythmia.  Chest x-ray is remarkable for some increased opacification around the perihilar regions without overt edema, clear focal consolidation, pneumothorax or large effusion.  I also reviewed radiology's interpretation.  X-ray of the left knee on my interpretation shows some osteoarthritis and small joint effusion without any fracture or other acute process.  I also reviewed radiology's interpretation.  X-ray of the left elbow, wrist and shoulder, interpretation without any large or displaced fractures.  Reviewed radiology capitation and agree with findings of possible small left radial head fracture age-indeterminate and some degenerative changes without other acute process.  BMP shows a glucose of 158 and a creatinine of 1.28 compared to 0.69 months ago without other significant electrolyte or metabolic derangements.  Lipase WNL.  Hepatic function panel unremarkable.  TSH elevated at 5.6.  Initial  lactic acid elevated 2.4.  Troponin WNL.  Magnesium 1.6.  Procalcitonin 0.1.  CK WNL.  COVID PCR is negative.  UA does not appear infected.  Depakote level is therapeutic.  Repeat lactic acid after IV fluids is 2.1  Given fever, tachycardia, and lactic acidosis concern for sepsis  possibly from a left knee septic joint versus other unclear source.  No clear findings on chest x-ray history or exam features to suggest pneumonia or other evidence of a cellulitis or acute abdominal process.  Urine does not seem to be the source.  Patient started on broad-spectrum antibiotics and blood cultures were obtained.  In addition arthrocentesis performed with patient's consent of the left knee per procedure note above.  I did attempt to reach his mother who he states usually helps to make medical decisions but was unable to do so and patient did consent to this after explain patient's risks and benefits.  In addition given possible age-indeterminate fracture left elbow CT left elbow obtained as well as ultrasound of left upper extremity to assess for possible DVT and to further characterize a possible small atrial head fracture.  CT of the left elbow on my review without evidence of fracture or dislocation.  I also reviewed radiology interpretation and agree with the findings of small hematoma superficial to the olecranon process without other acute process.  LUE [place] on my interpretation w/o evidence of occlusive DVT but there is a non-occlusive thrombus in the cephalic vein. I also reviewed radiology's interpretation.   Significant fluid cell count shows 73,000 WBCs with 96% neutrophils and monosodium urate crystals.  This is consistent with acute gout flare.  Given presence of crystals over lower suspicion for septic joint.  Given SIRS criteria patient was given empiric broad-spectrum antibiotics on arrival and synovial fluid as well as blood culture sent.  Will admit to medicine service for further evaluation and  management.     FINAL CLINICAL IMPRESSION(S) / ED DIAGNOSES   Final diagnoses:  Sepsis, due to unspecified organism, unspecified whether acute organ dysfunction present ([place])  Hypomagnesemia  Left arm pain  Left leg pain  Acute gout of left knee, unspecified cause  Traumatic hematoma of left elbow, initial encounter  Arm DVT (deep venous thromboembolism), acute, left ([place])     Rx / DC Orders   ED Discharge Orders     None        Note:  This document was prepared using Dragon voice recognition software and may include unintentional dictation errors.   Lucrezia Starch, MD 06/06/21 [phone]

## 2021-06-05 NOTE — ED Provider Notes (Incomplete)
[facility] Provider Note    Event Date/Time   First MD Initiated Contact with Patient 06/05/21 2136     (approximate)   History   Weakness   HPI  John Asfaw. is a 46 y.o. male with past medical history of anemia, asthma, DM, HTN, HDL, cardiomyopathy gout, obesity, schizophrenia, OSA and thyroid disease who presents via EMS from group home for evaluation of several days of weakness and left arm pain.  Patient states he thinks it started about 3 days ago.  He denies any injuries or falls or right-sided symptoms.  He denies any chest pain, cough, headache, back pain, abdominal pain, nausea, vomiting diarrhea or urinary symptoms.  He denies any rashes or insect bites.  Denies any recent travel outside [place].  Denies any prior similar episodes are clearly present improving factors.  [phone] [id number]   Physical Exam  Triage Vital Signs: ED Triage Vitals  Enc Vitals Group     BP 06/05/21 2111 107/70     Pulse Rate 06/05/21 2111 (!) 117     Resp 06/05/21 2111 (!) 22     Temp 06/05/21 2111 (!) 101.4 F (38.6 C)     Temp Source 06/05/21 2111 Oral     SpO2 06/05/21 2111 94 %     Weight 06/05/21 2108 293 lb (132.9 kg)     Height 06/05/21 2108 '6\' 2"'$  (1.88 m)     Head Circumference --      Peak Flow --      Pain Score 06/05/21 2108 8     Pain Loc --      Pain Edu? --      Excl. in [place]? --     Most recent vital signs: Vitals:   06/05/21 2230 06/05/21 2300  BP: 117/85 131/82  Pulse: (!) 116 (!) 116  Resp: (!) 26 (!) 21  Temp:    SpO2: 96% 93%    General: Awake, uncomfortable appearing. CV:  Good peripheral perfusion.  Tachycardic.  2+ radial pulses. Resp:  Normal effort.  Clear bilaterally. Abd:  No distention.  Soft. Other:  Sensation is intact in the distribution of the radial ulnar and median nerves in the bile upper extremities and throughout the left and right lower extremity to light touch.  Patient does seem globally weaker in the  left arm compared to the right arm only states it is sore everywhere including from the elbow to the wrist during the exam.  He also has some weakness and pain with range of motion of the left knee compared to the right.  The left knee is more erythematous slightly more edematous than the right.  No significant overlying changes over the foot or ankle or proximal to the knee.   ED Results / Procedures / Treatments  Labs (all labs ordered are listed, but only abnormal results are displayed) Labs Reviewed  BASIC METABOLIC PANEL - Abnormal; Notable for the following components:      Result Value   Sodium 133 (*)    Glucose, Bld 158 (*)    BUN 22 (*)    Creatinine, Ser 1.28 (*)    Calcium 8.8 (*)    All other components within normal limits  CBC - Abnormal; Notable for the following components:   Hemoglobin 11.8 (*)    HCT 38.6 (*)    MCH 25.9 (*)    All other components within normal limits  LACTIC ACID, PLASMA - Abnormal; Notable for the  following components:   Lactic Acid, Venous 2.4 (*)    All other components within normal limits  HEPATIC FUNCTION PANEL - Abnormal; Notable for the following components:   Total Protein 8.3 (*)    Albumin 3.3 (*)    AST 13 (*)    All other components within normal limits  TSH - Abnormal; Notable for the following components:   TSH 5.613 (*)    All other components within normal limits  CULTURE, BLOOD (ROUTINE X 2)  CULTURE, BLOOD (ROUTINE X 2)  URINE CULTURE  SARS CORONAVIRUS 2 BY RT PCR  LIPASE, BLOOD  LACTIC ACID, PLASMA  URINALYSIS, COMPLETE (UACMP) WITH MICROSCOPIC  MAGNESIUM  PROCALCITONIN  PROCALCITONIN  TROPONIN I (HIGH SENSITIVITY)  TROPONIN I (HIGH SENSITIVITY)     EKG  ECG is remarkable for sinus tachycardia with ventricular rate of 120 with normal axis, unremarkable intervals without clear evidence of acute ischemia or significant arrhythmia.   RADIOLOGY Chest x-ray is remarkable for some increased opacification around  the perihilar regions without overt edema, clear focal consolidation, pneumothorax or large effusion.  I also reviewed radiology's interpretation.  X-ray of the left knee on my interpretation shows some osteoarthritis and small joint effusion without any fracture or other acute process.  I also reviewed radiology's interpretation.   PROCEDURES:  Critical Care performed: Yes, see critical care procedure note(s)  .Critical Care Performed by: Lucrezia Starch, MD Authorized by: Lucrezia Starch, MD   Critical care provider statement:    Critical care time (minutes):  30   Critical care was necessary to treat or prevent imminent or life-threatening deterioration of the following conditions:  Sepsis   Critical care was time spent personally by me on the following activities:  Development of treatment plan with patient or surrogate, discussions with consultants, evaluation of patient's response to treatment, examination of patient, ordering and review of laboratory studies, ordering and review of radiographic studies, ordering and performing treatments and interventions, pulse oximetry, re-evaluation of patient's condition and review of old charts    [place] ED: Medications  sodium chloride 0.9 % bolus 1,000 mL (1,000 mLs Intravenous New Bag/Given 06/05/21 2247)  cefTRIAXone (ROCEPHIN) 2 g in sodium chloride 0.9 % 100 mL IVPB (2 g Intravenous New Bag/Given 06/05/21 2247)  acetaminophen (TYLENOL) tablet 1,000 mg (1,000 mg Oral Given 06/05/21 2122)  sodium chloride 0.9 % bolus 1,000 mL (1,000 mLs Intravenous New Bag/Given 06/05/21 2247)  ondansetron (ZOFRAN) injection 4 mg (4 mg Intravenous Given 06/05/21 2247)     IMPRESSION / MDM / [place] / ED COURSE  I reviewed the triage vital signs and the nursing notes. Patient's presentation is most consistent with acute presentation with potential threat to life or bodily function.                               Differential  diagnosis includes, but is not limited to septic arthritis, cellulitis,  ECG is remarkable for sinus tachycardia with ventricular rate of 120 with normal axis, unremarkable intervals without clear evidence of acute ischemia or significant arrhythmia.  Chest x-ray is remarkable for some increased opacification around the perihilar regions without overt edema, clear focal consolidation, pneumothorax or large effusion.  I also reviewed radiology's interpretation.  X-ray of the left knee on my interpretation shows some osteoarthritis and small joint effusion without any fracture or other acute process.  I also reviewed radiology's interpretation.  FINAL CLINICAL IMPRESSION(S) / ED DIAGNOSES   Final diagnoses:  None     Rx / DC Orders   ED Discharge Orders     None        Note:  This document was prepared using Dragon voice recognition software and may include unintentional dictation errors.

## 2021-06-06 ENCOUNTER — Emergency Department: Payer: 59

## 2021-06-06 ENCOUNTER — Inpatient Hospital Stay: Payer: 59

## 2021-06-06 ENCOUNTER — Encounter: Payer: Self-pay | Admitting: Radiology

## 2021-06-06 ENCOUNTER — Ambulatory Visit: Payer: 59 | Admitting: Internal Medicine

## 2021-06-06 DIAGNOSIS — I82622 Acute embolism and thrombosis of deep veins of left upper extremity: Secondary | ICD-10-CM | POA: Diagnosis present

## 2021-06-06 DIAGNOSIS — J45909 Unspecified asthma, uncomplicated: Secondary | ICD-10-CM | POA: Diagnosis present

## 2021-06-06 DIAGNOSIS — Z7984 Long term (current) use of oral hypoglycemic drugs: Secondary | ICD-10-CM | POA: Diagnosis not present

## 2021-06-06 DIAGNOSIS — I10 Essential (primary) hypertension: Secondary | ICD-10-CM | POA: Diagnosis present

## 2021-06-06 DIAGNOSIS — F609 Personality disorder, unspecified: Secondary | ICD-10-CM | POA: Diagnosis present

## 2021-06-06 DIAGNOSIS — E039 Hypothyroidism, unspecified: Secondary | ICD-10-CM | POA: Diagnosis present

## 2021-06-06 DIAGNOSIS — Z79890 Hormone replacement therapy (postmenopausal): Secondary | ICD-10-CM | POA: Diagnosis not present

## 2021-06-06 DIAGNOSIS — F200 Paranoid schizophrenia: Secondary | ICD-10-CM | POA: Diagnosis present

## 2021-06-06 DIAGNOSIS — Z91199 Patient's noncompliance with other medical treatment and regimen due to unspecified reason: Secondary | ICD-10-CM | POA: Diagnosis not present

## 2021-06-06 DIAGNOSIS — Z87891 Personal history of nicotine dependence: Secondary | ICD-10-CM | POA: Diagnosis not present

## 2021-06-06 DIAGNOSIS — R6510 Systemic inflammatory response syndrome (SIRS) of non-infectious origin without acute organ dysfunction: Secondary | ICD-10-CM | POA: Diagnosis present

## 2021-06-06 DIAGNOSIS — N179 Acute kidney failure, unspecified: Secondary | ICD-10-CM | POA: Diagnosis present

## 2021-06-06 DIAGNOSIS — G4733 Obstructive sleep apnea (adult) (pediatric): Secondary | ICD-10-CM | POA: Diagnosis present

## 2021-06-06 DIAGNOSIS — D649 Anemia, unspecified: Secondary | ICD-10-CM | POA: Diagnosis present

## 2021-06-06 DIAGNOSIS — E119 Type 2 diabetes mellitus without complications: Secondary | ICD-10-CM | POA: Diagnosis present

## 2021-06-06 DIAGNOSIS — I429 Cardiomyopathy, unspecified: Secondary | ICD-10-CM | POA: Diagnosis present

## 2021-06-06 DIAGNOSIS — M10062 Idiopathic gout, left knee: Secondary | ICD-10-CM | POA: Diagnosis present

## 2021-06-06 DIAGNOSIS — R6511 Systemic inflammatory response syndrome (SIRS) of non-infectious origin with acute organ dysfunction: Secondary | ICD-10-CM

## 2021-06-06 DIAGNOSIS — M009 Pyogenic arthritis, unspecified: Secondary | ICD-10-CM | POA: Diagnosis present

## 2021-06-06 DIAGNOSIS — Z79899 Other long term (current) drug therapy: Secondary | ICD-10-CM | POA: Diagnosis not present

## 2021-06-06 DIAGNOSIS — I272 Pulmonary hypertension, unspecified: Secondary | ICD-10-CM | POA: Diagnosis present

## 2021-06-06 DIAGNOSIS — Z20822 Contact with and (suspected) exposure to covid-19: Secondary | ICD-10-CM | POA: Diagnosis present

## 2021-06-06 LAB — MAGNESIUM: Magnesium: 1.6 mg/dL — ABNORMAL LOW (ref 1.7–2.4)

## 2021-06-06 LAB — URINALYSIS, COMPLETE (UACMP) WITH MICROSCOPIC
Bacteria, UA: NONE SEEN
Bilirubin Urine: NEGATIVE
Glucose, UA: NEGATIVE mg/dL
Hgb urine dipstick: NEGATIVE
Ketones, ur: NEGATIVE mg/dL
Leukocytes,Ua: NEGATIVE
Nitrite: NEGATIVE
Protein, ur: NEGATIVE mg/dL
Specific Gravity, Urine: 1.009 (ref 1.005–1.030)
pH: 5 (ref 5.0–8.0)

## 2021-06-06 LAB — CBC WITH DIFFERENTIAL/PLATELET
Abs Immature Granulocytes: 0.05 10*3/uL (ref 0.00–0.07)
Basophils Absolute: 0 10*3/uL (ref 0.0–0.1)
Basophils Relative: 0 %
Eosinophils Absolute: 0 10*3/uL (ref 0.0–0.5)
Eosinophils Relative: 0 %
HCT: 34 % — ABNORMAL LOW (ref 39.0–52.0)
Hemoglobin: 10.2 g/dL — ABNORMAL LOW (ref 13.0–17.0)
Immature Granulocytes: 1 %
Lymphocytes Relative: 24 %
Lymphs Abs: 2.1 10*3/uL (ref 0.7–4.0)
MCH: 26 pg (ref 26.0–34.0)
MCHC: 30 g/dL (ref 30.0–36.0)
MCV: 86.5 fL (ref 80.0–100.0)
Monocytes Absolute: 1.4 10*3/uL — ABNORMAL HIGH (ref 0.1–1.0)
Monocytes Relative: 15 %
Neutro Abs: 5.5 10*3/uL (ref 1.7–7.7)
Neutrophils Relative %: 60 %
Platelets: 168 10*3/uL (ref 150–400)
RBC: 3.93 MIL/uL — ABNORMAL LOW (ref 4.22–5.81)
RDW: 14 % (ref 11.5–15.5)
WBC: 9.1 10*3/uL (ref 4.0–10.5)
nRBC: 0 % (ref 0.0–0.2)

## 2021-06-06 LAB — CBG MONITORING, ED
Glucose-Capillary: 110 mg/dL — ABNORMAL HIGH (ref 70–99)
Glucose-Capillary: 89 mg/dL (ref 70–99)

## 2021-06-06 LAB — URINE DRUG SCREEN, QUALITATIVE (ARMC ONLY)
Amphetamines, Ur Screen: NOT DETECTED
Barbiturates, Ur Screen: NOT DETECTED
Benzodiazepine, Ur Scrn: NOT DETECTED
Cannabinoid 50 Ng, Ur ~~LOC~~: NOT DETECTED
Cocaine Metabolite,Ur ~~LOC~~: NOT DETECTED
MDMA (Ecstasy)Ur Screen: NOT DETECTED
Methadone Scn, Ur: NOT DETECTED
Opiate, Ur Screen: NOT DETECTED
Phencyclidine (PCP) Ur S: NOT DETECTED
Tricyclic, Ur Screen: NOT DETECTED

## 2021-06-06 LAB — GLUCOSE, CAPILLARY
Glucose-Capillary: 130 mg/dL — ABNORMAL HIGH (ref 70–99)
Glucose-Capillary: 153 mg/dL — ABNORMAL HIGH (ref 70–99)

## 2021-06-06 LAB — BLOOD GAS, VENOUS
Acid-Base Excess: 5.8 mmol/L — ABNORMAL HIGH (ref 0.0–2.0)
Bicarbonate: 31.7 mmol/L — ABNORMAL HIGH (ref 20.0–28.0)
O2 Saturation: 95.2 %
Patient temperature: 37
pCO2, Ven: 50 mmHg (ref 44–60)
pH, Ven: 7.41 (ref 7.25–7.43)
pO2, Ven: 69 mmHg — ABNORMAL HIGH (ref 32–45)

## 2021-06-06 LAB — SYNOVIAL CELL COUNT + DIFF, W/ CRYSTALS
Eosinophils-Synovial: 0 %
Lymphocytes-Synovial Fld: 4 %
Monocyte-Macrophage-Synovial Fluid: 0 %
Neutrophil, Synovial: 96 %
Other Cells-SYN: 0
WBC, Synovial: 72980 /mm3 — ABNORMAL HIGH (ref 0–200)

## 2021-06-06 LAB — PROCALCITONIN
Procalcitonin: 0.12 ng/mL
Procalcitonin: 0.1 ng/mL

## 2021-06-06 LAB — HEPARIN LEVEL (UNFRACTIONATED): Heparin Unfractionated: 0.19 IU/mL — ABNORMAL LOW (ref 0.30–0.70)

## 2021-06-06 LAB — PROTIME-INR
INR: 1.3 — ABNORMAL HIGH (ref 0.8–1.2)
Prothrombin Time: 16.5 seconds — ABNORMAL HIGH (ref 11.4–15.2)

## 2021-06-06 LAB — SARS CORONAVIRUS 2 BY RT PCR: SARS Coronavirus 2 by RT PCR: NEGATIVE

## 2021-06-06 LAB — CK: Total CK: 129 U/L (ref 49–397)

## 2021-06-06 LAB — APTT: aPTT: 35 seconds (ref 24–36)

## 2021-06-06 LAB — HEMOGLOBIN A1C
Hgb A1c MFr Bld: 6.5 % — ABNORMAL HIGH (ref 4.8–5.6)
Mean Plasma Glucose: 139.85 mg/dL

## 2021-06-06 LAB — LACTIC ACID, PLASMA: Lactic Acid, Venous: 2.1 mmol/L (ref 0.5–1.9)

## 2021-06-06 LAB — VALPROIC ACID LEVEL: Valproic Acid Lvl: 91 ug/mL (ref 50.0–100.0)

## 2021-06-06 LAB — T4, FREE: Free T4: 1.05 ng/dL (ref 0.61–1.12)

## 2021-06-06 MED ORDER — ALBUTEROL SULFATE (2.5 MG/3ML) 0.083% IN NEBU
2.5000 mg | INHALATION_SOLUTION | Freq: Four times a day (QID) | RESPIRATORY_TRACT | Status: DC | PRN
Start: 2021-06-06 — End: 2021-06-12
  Administered 2021-06-06: 2.5 mg via RESPIRATORY_TRACT
  Filled 2021-06-06: qty 3

## 2021-06-06 MED ORDER — ONDANSETRON HCL 4 MG PO TABS: 4.0000 mg | ORAL_TABLET | Freq: Four times a day (QID) | ORAL | Status: DC | PRN

## 2021-06-06 MED ORDER — OXYBUTYNIN CHLORIDE 5 MG PO TABS
5.0000 mg | ORAL_TABLET | Freq: Every day | ORAL | Status: DC
  Administered 2021-06-06 – 2021-06-12 (×7): 5 mg via ORAL
  Filled 2021-06-06 (×7): qty 1

## 2021-06-06 MED ORDER — VANCOMYCIN HCL 1250 MG/250ML IV SOLN
1250.0000 mg | Freq: Two times a day (BID) | INTRAVENOUS | Status: DC
  Administered 2021-06-06 – 2021-06-07 (×2): 1250 mg via INTRAVENOUS
  Filled 2021-06-06 (×2): qty 250

## 2021-06-06 MED ORDER — LIDOCAINE HCL (PF) 1 % IJ SOLN
INTRAMUSCULAR | Status: AC
  Administered 2021-06-06: 1 mL
  Filled 2021-06-06: qty 5

## 2021-06-06 MED ORDER — VANCOMYCIN HCL IN DEXTROSE 1-5 GM/200ML-% IV SOLN
1000.0000 mg | Freq: Once | INTRAVENOUS | Status: AC
  Administered 2021-06-06: 1000 mg via INTRAVENOUS
  Filled 2021-06-06: qty 200

## 2021-06-06 MED ORDER — ACETAMINOPHEN 325 MG PO TABS
650.0000 mg | ORAL_TABLET | Freq: Four times a day (QID) | ORAL | Status: DC | PRN
  Administered 2021-06-07 – 2021-06-11 (×4): 650 mg via ORAL
  Filled 2021-06-06 (×4): qty 2

## 2021-06-06 MED ORDER — INSULIN ASPART 100 UNIT/ML IJ SOLN
0.0000 [IU] | Freq: Three times a day (TID) | INTRAMUSCULAR | Status: DC
  Administered 2021-06-06: 3 [IU] via SUBCUTANEOUS
  Administered 2021-06-07: 7 [IU] via SUBCUTANEOUS
  Administered 2021-06-07 – 2021-06-08 (×4): 3 [IU] via SUBCUTANEOUS
  Administered 2021-06-08: 4 [IU] via SUBCUTANEOUS
  Administered 2021-06-09: 3 [IU] via SUBCUTANEOUS
  Administered 2021-06-09: 4 [IU] via SUBCUTANEOUS
  Administered 2021-06-09 – 2021-06-10 (×2): 7 [IU] via SUBCUTANEOUS
  Administered 2021-06-10 (×2): 4 [IU] via SUBCUTANEOUS
  Administered 2021-06-11 (×3): 3 [IU] via SUBCUTANEOUS
  Administered 2021-06-12: 4 [IU] via SUBCUTANEOUS
  Administered 2021-06-12: 3 [IU] via SUBCUTANEOUS
  Filled 2021-06-06 (×18): qty 1

## 2021-06-06 MED ORDER — ASENAPINE MALEATE 5 MG SL SUBL
10.0000 mg | SUBLINGUAL_TABLET | Freq: Every day | SUBLINGUAL | Status: DC
  Administered 2021-06-06 – 2021-06-12 (×7): 10 mg via SUBLINGUAL
  Filled 2021-06-06 (×8): qty 2

## 2021-06-06 MED ORDER — MAGNESIUM SULFATE 2 GM/50ML IV SOLN
2.0000 g | Freq: Once | INTRAVENOUS | Status: AC
  Administered 2021-06-06: 2 g via INTRAVENOUS
  Filled 2021-06-06: qty 50

## 2021-06-06 MED ORDER — INSULIN ASPART 100 UNIT/ML IJ SOLN
0.0000 [IU] | Freq: Every day | INTRAMUSCULAR | Status: DC
  Administered 2021-06-07: 5 [IU] via SUBCUTANEOUS
  Administered 2021-06-09: 2 [IU] via SUBCUTANEOUS
  Filled 2021-06-06 (×2): qty 1

## 2021-06-06 MED ORDER — HEPARIN BOLUS VIA INFUSION
5000.0000 [IU] | Freq: Once | INTRAVENOUS | Status: AC
  Administered 2021-06-06: 5000 [IU] via INTRAVENOUS
  Filled 2021-06-06: qty 5000

## 2021-06-06 MED ORDER — ATENOLOL 25 MG PO TABS: 100.0000 mg | ORAL_TABLET | Freq: Every day | ORAL | Status: DC

## 2021-06-06 MED ORDER — COLCHICINE 0.6 MG PO TABS
0.6000 mg | ORAL_TABLET | Freq: Two times a day (BID) | ORAL | Status: DC
Start: 2021-06-06 — End: 2021-06-12
  Administered 2021-06-06 – 2021-06-12 (×13): 0.6 mg via ORAL
  Filled 2021-06-06 (×15): qty 1

## 2021-06-06 MED ORDER — DIVALPROEX SODIUM ER 500 MG PO TB24
2000.0000 mg | ORAL_TABLET | Freq: Every day | ORAL | Status: DC
  Administered 2021-06-06 – 2021-06-11 (×6): 2000 mg via ORAL
  Filled 2021-06-06 (×7): qty 4

## 2021-06-06 MED ORDER — KETOROLAC TROMETHAMINE 30 MG/ML IJ SOLN: 30.0000 mg | Freq: Four times a day (QID) | INTRAMUSCULAR | Status: AC | PRN

## 2021-06-06 MED ORDER — LACTATED RINGERS IV SOLN: INTRAVENOUS | Status: AC

## 2021-06-06 MED ORDER — ACETAMINOPHEN 650 MG RE SUPP: 650.0000 mg | Freq: Four times a day (QID) | RECTAL | Status: DC | PRN

## 2021-06-06 MED ORDER — HYDROCODONE-ACETAMINOPHEN 5-325 MG PO TABS: 1.0000 | ORAL_TABLET | ORAL | Status: DC | PRN

## 2021-06-06 MED ORDER — HEPARIN (PORCINE) 25000 UT/250ML-% IV SOLN
2300.0000 [IU]/h | INTRAVENOUS | Status: DC
  Administered 2021-06-06 (×3): 1900 [IU]/h via INTRAVENOUS
  Administered 2021-06-07: 2300 [IU]/h via INTRAVENOUS
  Filled 2021-06-06 (×3): qty 250

## 2021-06-06 MED ORDER — PANTOPRAZOLE SODIUM 40 MG PO TBEC
40.0000 mg | DELAYED_RELEASE_TABLET | Freq: Every day | ORAL | Status: DC
  Administered 2021-06-06 – 2021-06-12 (×7): 40 mg via ORAL
  Filled 2021-06-06 (×7): qty 1

## 2021-06-06 MED ORDER — ENOXAPARIN SODIUM 80 MG/0.8ML IJ SOSY
0.5000 mg/kg | PREFILLED_SYRINGE | INTRAMUSCULAR | Status: DC
Start: 2021-06-06 — End: 2021-06-06
  Filled 2021-06-06: qty 0.68

## 2021-06-06 MED ORDER — LEVOTHYROXINE SODIUM 50 MCG PO TABS
75.0000 ug | ORAL_TABLET | Freq: Every day | ORAL | Status: DC
  Administered 2021-06-06 – 2021-06-12 (×7): 75 ug via ORAL
  Filled 2021-06-06: qty 2
  Filled 2021-06-06 (×6): qty 1

## 2021-06-06 MED ORDER — ONDANSETRON HCL 4 MG/2ML IJ SOLN: 4.0000 mg | Freq: Four times a day (QID) | INTRAMUSCULAR | Status: DC | PRN

## 2021-06-06 MED ORDER — VANCOMYCIN HCL 1500 MG/300ML IV SOLN
1500.0000 mg | Freq: Once | INTRAVENOUS | Status: AC
  Administered 2021-06-06: 1500 mg via INTRAVENOUS
  Filled 2021-06-06: qty 300

## 2021-06-06 MED ORDER — SODIUM CHLORIDE 0.9 % IV SOLN: 1.0000 g | INTRAVENOUS | Status: DC

## 2021-06-06 MED ORDER — ATORVASTATIN CALCIUM 20 MG PO TABS
10.0000 mg | ORAL_TABLET | Freq: Every day | ORAL | Status: DC
  Administered 2021-06-06 – 2021-06-12 (×7): 10 mg via ORAL
  Filled 2021-06-06 (×7): qty 1

## 2021-06-06 MED ORDER — CLOZAPINE 25 MG PO TABS
150.0000 mg | ORAL_TABLET | Freq: Every day | ORAL | Status: DC
  Administered 2021-06-06 – 2021-06-11 (×6): 150 mg via ORAL
  Filled 2021-06-06 (×7): qty 2

## 2021-06-06 MED ORDER — HEPARIN BOLUS VIA INFUSION
3000.0000 [IU] | Freq: Once | INTRAVENOUS | Status: AC
  Administered 2021-06-06: 3000 [IU] via INTRAVENOUS
  Filled 2021-06-06: qty 3000

## 2021-06-06 NOTE — Progress Notes (Signed)
ANTICOAGULATION CONSULT NOTE - Initial Consult  Pharmacy Consult for Heparin  Indication: DVT  No Known Allergies  Patient Measurements: Height: '6\' 2"'$  (188 cm) Weight: 132.9 kg (293 lb) IBW/kg (Calculated) : 82.2 Heparin Dosing Weight: 111.8 kg   Vital Signs: Temp: 98.4 F (36.9 C) (06/06 0000) Temp Source: Oral (06/06 0000) BP: 116/81 (06/06 0430) Pulse Rate: 101 (06/06 0430)  Labs: Recent Labs    06/05/21 2120 06/05/21 2309 06/06/21 0121  HGB 11.8*  --  10.2*  HCT 38.6*  --  34.0*  PLT 196  --  168  CREATININE 1.28*  --   --   CKTOTAL  --  129  --   TROPONINIHS 3 4  --     Estimated Creatinine Clearance: 106.8 mL/min (A) (by C-G formula based on SCr of 1.28 mg/dL (H)).   Medical History: 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 tophus 07/20/2019   Morbid obesity ([place])    Paranoid schizophrenia ([name])    Personality disorder ([name])    Sleep apnea    Thyroid disease     Medications:  (Not in a hospital admission)   Assessment: Pharmacy consulted to dose heparin in this 46 year old male admitted with ACS/NSTEMI.  No prior anticoag noted.  CrCl = 106.8 ml/min  Goal of Therapy:  Heparin level 0.3-0.7 units/ml Monitor platelets by anticoagulation protocol: Yes   Plan:  Heparin 5000 units IV X 1 bolus ordered followed  by heparin drip to start @ 1900 units/hr. Will order HL 6 hrs after start of drip.   John [name] D 06/06/2021,5:40 AM

## 2021-06-06 NOTE — Assessment & Plan Note (Addendum)
Ruled out Patient with sepsis criteria on admission but without definite source of infection  Repeat lactic improved  Procalcitonin negative  Cultured negative  Had been on vanc/rocephin --> d/c abx w/o adverse effect

## 2021-06-06 NOTE — Progress Notes (Signed)
[facility] PLAN OF CARE NOTE Patient: Dewon Mendizabal. JIR:[id number]   PCP: Cletis Athens, MD DOB: 01/04/1975   DOA: 06/05/2021   DOS: 06/06/2021    Patient was admitted by my colleague earlier on 06/06/2021. I have reviewed the H&P as well as assessment and plan and agree with the same. Important changes in the plan are listed below.  Plan of care: Principal Problem:   SIRS of non-infectious origin w acute organ dysfunction ([name]) Active Problems:   Sepsis, possible([name])   AKI (acute kidney injury) ([place])   Gout   Acute deep vein thrombosis (DVT) of left upper extremity ([name])   Essential hypertension   Hypothyroidism   Paranoid schizophrenia, chronic condition ([place])   Diabetes mellitus ([place])   OSA on CPAP   SIRS (systemic inflammatory response syndrome) ([place])    When I reviewed with the patient the patient was minimally responsive with pinpoint pupils.  Requiring severe painful stimulus for response. I stopped the heparin.  CT head was unremarkable.  Heparin was resumed. CBG was checked which was 89. VBG was ordered but not done. Mentation improved after CT scan of the head patient was able to follow commands and answer questions. Continue to monitor on telemetry.  Continue with antibiotics. CPAP nightly. Avoid narcotics.  Author: Berle Mull, MD [facility] 06/06/2021 6:43 PM   If 7PM-7AM, please contact night-coverage at [email or web address]

## 2021-06-06 NOTE — Progress Notes (Signed)
CODE SEPSIS - PHARMACY COMMUNICATION  **Broad Spectrum Antibiotics should be administered within 1 hour of Sepsis diagnosis**  Time Code Sepsis Called/Page Received:  6/6 @ 0217   Antibiotics Ordered: Ceftriaxone 2 gm, Vancomycin 2500 mg   Time of 1st antibiotic administration: Ceftriaxone 2 gm IV X 1 on 6/5 @ 2247   Additional action taken by pharmacy: none  If necessary, Name of Provider/Nurse Contacted:     Felishia Wartman D ,PharmD Clinical Pharmacist  06/06/2021  2:23 AM

## 2021-06-06 NOTE — Progress Notes (Signed)
CLOZAPINE MONITORING (reflects NEW REMS GUIDELINES EFFECTIVE 10/12/2013):  Check ANC at least weekly while inpatient.  For general population patients, i.e., those without benign ethnic neutropenia (BEN): --If [place] [phone], increase ANC monitoring to 3x/wk --If ANC < 1000, HOLD CLOZAPINE and get psych consult   For patients with BEN: --If [place], increase ANC monitoring to 3x/wk --If ANC < 500, HOLD CLOZAPINE and get psych consult  REMS-certified psychiatry provider can continue drug with [place] below cited thresholds if they document medical opinion that the neutropenia is not clozapine-induced (heme consult is recommended) or that risk of interrupting therapy is greater than the risk of developing severe neutropenia.  --SEE PRESCRIBING INFORMATION FOR ADDITIONAL DETAILS   6/6:  John [name] = [phone] Pt is enrolled in clozaril REMS program and eligible to receive Clozaril. Will monitor ANC weekly per [facility] policy.  Next [place] on 6/13 @ 0800.

## 2021-06-06 NOTE — Assessment & Plan Note (Addendum)
   Continue Depakote and Clozaril

## 2021-06-06 NOTE — ED Notes (Addendum)
MD notified RN of pt pin point pupils and not repsonsive. Pt responsive to physical stimuli. Verbally states, "what you want, you got lunch?". Pt has been sleeping all day and responsive to verbal and/or physical stimuli. Will notify MD.  Heparin stopped per Dr Posey Pronto

## 2021-06-06 NOTE — ED Notes (Signed)
Xray at the bedside.

## 2021-06-06 NOTE — Progress Notes (Signed)
Pharmacy Antibiotic Note  John [name]. is a 46 y.o. male admitted on 06/05/2021 with cellulitis.  Pharmacy has been consulted for Vancomycin dosing.  Plan: Vancomycin 1 gm IV X 1 given in ED on 6/6 @ 0343 followed by Vanc 1500 mg IV X 1 @ 0451.   Vancomycin 1250 mg IV Q12H ordered to start on 6/6 @ 1600.  AUC = 498.5 Vanc trough = 14.3   Height: '6\' 2"'$  (188 cm) Weight: 132.9 kg (293 lb) IBW/kg (Calculated) : 82.2  Temp (24hrs), Avg:99.9 F (37.7 C), Min:98.4 F (36.9 C), Max:101.4 F (38.6 C)  Recent Labs  Lab 06/05/21 2120 06/05/21 2238 06/06/21 0121  WBC 9.0  --  9.1  CREATININE 1.28*  --   --   LATICACIDVEN  --  2.4* 2.1*    Estimated Creatinine Clearance: 106.8 mL/min (A) (by C-G formula based on SCr of 1.28 mg/dL (H)).    No Known Allergies  Antimicrobials this admission:   >>    >>   Dose adjustments this admission:   Microbiology results:  BCx:   UCx:    Sputum:    MRSA PCR:   Thank you for allowing pharmacy to be a part of this patient's care.  Anthonio Mizzell D 06/06/2021 6:35 AM

## 2021-06-06 NOTE — Assessment & Plan Note (Addendum)
TSH was elevated.  Follow-up free T4 was WNL  Continue levothyroxine but increased dose 75 to 88 mcg daily   Follow outpatient

## 2021-06-06 NOTE — ED Notes (Signed)
Dr. Duncan at the bedside  

## 2021-06-06 NOTE — Progress Notes (Signed)
PHARMACY -  BRIEF ANTIBIOTIC NOTE   Pharmacy has received consult(s) for Vancomycin from an ED provider.  The patient's profile has been reviewed for ht/wt/allergies/indication/available labs.    One time order(s) placed for Vancomycin 2500 mg IV X 1.   Further antibiotics/pharmacy consults should be ordered by admitting physician if indicated.                       Thank you, Aryav Wimberly D 06/06/2021  1:54 AM

## 2021-06-06 NOTE — Assessment & Plan Note (Addendum)
   Sliding scale insulin coverage inpatient --> Resume home medications

## 2021-06-06 NOTE — ED Notes (Signed)
Pt linens changed, peri care provided, gown changed. Warm blankets provided.

## 2021-06-06 NOTE — H&P (Signed)
History and Physical    Patient: John [name]. [id number]:[id number] DOB: Mar 11, 1975 DOA: 06/05/2021 DOS: the patient was seen and examined on 06/06/2021 PCP: Cletis [place], MD  Patient coming from:  group home  Chief Complaint:  Chief Complaint  Patient presents with   Weakness    HPI: John [name]. is a 46 y.o. male with medical history significant for Schizophrenia, OSA noncompliant with BiPAP, hypertension, asthma, gout bilateral knees, hospitalized in August 2022 with sepsis secondary to suspected right septic knee joint but with negative synovial fluid cultures who presents to the ED from his group home with a complaint of left knee pain as well as left arm pain without any reports of injury. ED course and data review: Tmax 101.4 with pulse 117, respirations 22-26.  Labs with WBC of 9 and lipase 2.1.  Hemoglobin 11.8.  Creatinine 1.28 above baseline of 0.6.  Synovial cell count shows intracellular monosodium urate crystals, 73,000 WBCs 96% neutrophils. Extensive imaging reviewed: Chest x-ray, left knee, shoulder, elbow, wrist as well as CT left elbow and left upper extremity venous Doppler.  Pertinent results include: LUE Venous doppler: Short segment of nonocclusive thrombus within cephalic vein near the elbow joint Left elbow KZ:[id number] hematoma superficial to the olecranon process  Bilateral knee DG: Mild osteoarthritis and minimal joint effusion. No acute osseous Abnormality  Patient was started on an IV fluid bolus per sepsis protocol and started on ceftriaxone and vancomycin for septic arthritis.  Hospitalist consulted for admission.   Review of Systems: As mentioned in the history of present illness. All other systems reviewed and are negative.  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 tophus 07/20/2019   Morbid obesity ([place])    Paranoid schizophrenia ([place])    Personality disorder ([place])     Sleep apnea    Thyroid disease    Past Surgical History:  Procedure Laterality Date   FRACTURE SURGERY     KNEE ARTHROSCOPY Right 08/30/2020   Procedure: ARTHROSCOPY KNEE- I&D, PARTIAL LATERAL MENISCECTOMY;  Surgeon: [name], MD;  Location: [facility] ORS;  Service: Orthopedics;  Laterality: Right;   Social History:  reports that he has quit smoking. He has never used smokeless tobacco. He reports that he does not currently use alcohol. He reports that he does not use drugs.  No Known Allergies  Family History  Problem Relation Age of Onset   Glaucoma Mother     Prior to Admission medications   Medication Sig Start Date End Date Taking? Authorizing Provider  ACCU-CHEK GUIDE test strip USE TO CHECK BLOOD SUGAR DAILY 10/24/20  Yes Masoud, Viann Shove, MD  Accu-Chek Softclix Lancets lancets USE TO CHECK BLOOD SUGAR DAILY 02/10/21  Yes Masoud, Viann Shove, MD  albuterol (PROVENTIL) (2.5 MG/3ML) 0.083% nebulizer solution USE 1 VIAL VIA NEBULIZER 3 TIMES A DAY 03/30/21  Yes Masoud, Viann Shove, MD  Alcohol Swabs (B-D SINGLE USE SWABS REGULAR) PADS USE AS DIRECTED FOR BLOOD SUGAR CHECKS. 08/20/19  Yes Masoud, Viann Shove, MD  Asenapine Maleate 10 MG SUBL Place 1 tablet under the tongue daily. 05/01/21  Yes [provider]  atenolol (TENORMIN) 100 MG tablet TAKE 1 TABLET BY MOUTH DAILY 04/27/21  Yes Masoud, Viann Shove, MD  atorvastatin (LIPITOR) 10 MG tablet TAKE 1 TABLET BY MOUTH DAILY 08/05/20  Yes Masoud, Viann Shove, MD  cloZAPine (CLOZARIL) 100 MG tablet Take 150 mg by mouth at bedtime.  Yes [provider]  divalproex (DEPAKOTE ER) 500 MG 24 hr tablet Take 2,000 mg by mouth at bedtime.    Yes [provider]  levothyroxine (SYNTHROID) 75 MCG tablet TAKE 1 TABLET BY MOUTH EVERY DAY ON AN EMPTY STOMACH 08/05/20  Yes Masoud, Viann Shove, MD  metFORMIN (GLUCOPHAGE) 1000 MG tablet TAKE 1 TABLET BY MOUTH TWICE A DAY WITH FOOD 05/25/21  Yes Masoud, Viann Shove, MD  omeprazole (PRILOSEC) 20 MG capsule TAKE 1 CAPSULE BY  MOUTH DAILY 02/23/21  Yes Masoud, Viann Shove, MD  oxybutynin (DITROPAN) 5 MG tablet Take 5 mg by mouth daily. 11/02/20  Yes [provider]    Physical Exam: Vitals:   06/06/21 0000 06/06/21 0030 06/06/21 0120 06/06/21 0300  BP: 122/77 121/67 127/75 113/77  Pulse: (!) 108 (!) 108 (!) 106 (!) 102  Resp: 20 (!) '29 19 18  '$ Temp: 98.4 F (36.9 C)     TempSrc: Oral     SpO2: 95% 96% 94% 95%  Weight:      Height:       Physical Exam Vitals and nursing note reviewed.  Constitutional:      General: He is not in acute distress. HENT:     Head: Normocephalic and atraumatic.  Cardiovascular:     Rate and Rhythm: Normal rate and regular rhythm.     Heart sounds: Normal heart sounds.  Pulmonary:     Effort: Pulmonary effort is normal.     Breath sounds: Normal breath sounds.  Abdominal:     Palpations: Abdomen is soft.     Tenderness: There is no abdominal tenderness.  Musculoskeletal:     Comments: Left knee effusion but not very tender  Mild differential and Baroni swelling left upper arm compared to right  Neurological:     Mental Status: Mental status is at baseline.    Labs on Admission: I have personally reviewed following labs and imaging studies  CBC: Recent Labs  Lab 06/05/21 2120  WBC 9.0  HGB 11.8*  HCT 38.6*  MCV 84.6  PLT 989   Basic Metabolic Panel: Recent Labs  Lab 06/05/21 2120 06/05/21 2309  NA 133*  --   K 4.0  --   CL 98  --   CO2 26  --   GLUCOSE 158*  --   BUN 22*  --   CREATININE 1.28*  --   CALCIUM 8.8*  --   MG  --  1.6*   GFR: Estimated Creatinine Clearance: 106.8 mL/min (A) (by C-G formula based on SCr of 1.28 mg/dL (H)). Liver Function Tests: Recent Labs  Lab 06/05/21 2120  AST 13*  ALT 9  ALKPHOS 42  BILITOT 0.6  PROT 8.3*  ALBUMIN 3.3*   Recent Labs  Lab 06/05/21 2120  LIPASE 24   No results for input(s): AMMONIA in the last 168 hours. Coagulation Profile: No results for input(s): INR, PROTIME in the last 168  hours. Cardiac Enzymes: Recent Labs  Lab 06/05/21 2309  CKTOTAL 129   BNP (last 3 results) No results for input(s): PROBNP in the last 8760 hours. HbA1C: No results for input(s): HGBA1C in the last 72 hours. CBG: No results for input(s): GLUCAP in the last 168 hours. Lipid Profile: No results for input(s): CHOL, HDL, LDLCALC, TRIG, CHOLHDL, LDLDIRECT in the last 72 hours. Thyroid Function Tests: Recent Labs    06/05/21 2120 06/05/21 2309  TSH 5.613*  --   FREET4  --  1.05   Anemia Panel: No results for  input(s): VITAMINB12, FOLATE, FERRITIN, TIBC, IRON, RETICCTPCT in the last 72 hours. Urine analysis:    Component Value Date/Time   COLORURINE YELLOW (A) 06/06/2021 0121   APPEARANCEUR CLEAR (A) 06/06/2021 0121   APPEARANCEUR Clear 04/09/2014 1215   LABSPEC 1.009 06/06/2021 0121   LABSPEC 1.015 04/09/2014 1215   PHURINE 5.0 06/06/2021 0121   GLUCOSEU NEGATIVE 06/06/2021 0121   GLUCOSEU Negative 04/09/2014 1215   HGBUR NEGATIVE 06/06/2021 0121   BILIRUBINUR NEGATIVE 06/06/2021 0121   BILIRUBINUR Negative 04/09/2014 1215   KETONESUR NEGATIVE 06/06/2021 0121   PROTEINUR NEGATIVE 06/06/2021 0121   UROBILINOGEN 1.0 10/23/2011 1845   NITRITE NEGATIVE 06/06/2021 0121   LEUKOCYTESUR NEGATIVE 06/06/2021 0121   LEUKOCYTESUR Negative 04/09/2014 1215    Radiological Exams on Admission: DG Elbow Complete Left  Result Date: 06/06/2021 CLINICAL DATA:  Atraumatic left arm pain. EXAM: LEFT ELBOW - COMPLETE 3+ VIEW COMPARISON:  None Available. FINDINGS: A very small area of cortical irregularity is seen along the volar aspect of the left radial head. This is of indeterminate age. There is no evidence of dislocation. A moderate to large bony spur is seen along the olecranon process of the proximal left ulna. Mild soft tissue swelling is seen along the dorsal aspect of the olecranon process. IMPRESSION: 1. Findings which may represent a very small left radial head fracture, of  indeterminate age. Correlation with physical examination is recommended to determine the presence of point tenderness. 2. Degenerative changes with mild dorsal soft tissue swelling. Electronically Signed   By: Virgina Norfolk M.D.   On: 06/06/2021 00:58   DG Wrist Complete Left  Result Date: 06/06/2021 CLINICAL DATA:  Atraumatic left arm pain. EXAM: LEFT WRIST - COMPLETE 3+ VIEW COMPARISON:  None Available. FINDINGS: There is no evidence of an acute fracture or dislocation. Two radiopaque surgical clips are seen within the fourth left metacarpal. There is no evidence of arthropathy or other focal bone abnormality. Soft tissues are unremarkable. IMPRESSION: No acute osseous abnormality. Electronically Signed   By: Virgina Norfolk M.D.   On: 06/06/2021 00:59   US Venous Img Upper Uni Left  Result Date: 06/06/2021 CLINICAL DATA:  History of left arm pain for 3 days EXAM: LEFT UPPER EXTREMITY VENOUS DOPPLER ULTRASOUND TECHNIQUE: Gray-scale sonography with graded compression, as well as color Doppler and duplex ultrasound were performed to evaluate the upper extremity deep venous system from the level of the subclavian vein and including the jugular, axillary, basilic, radial, ulnar and upper cephalic vein. Spectral Doppler was utilized to evaluate flow at rest and with distal augmentation maneuvers. COMPARISON:  None Available. FINDINGS: Contralateral Subclavian Vein: Respiratory phasicity is normal and symmetric with the symptomatic side. No evidence of thrombus. Normal compressibility. Internal Jugular Vein: No evidence of thrombus. Normal compressibility, respiratory phasicity and response to augmentation. Subclavian Vein: No evidence of thrombus. Normal compressibility, respiratory phasicity and response to augmentation. Axillary Vein: No evidence of thrombus. Normal compressibility, respiratory phasicity and response to augmentation. Cephalic Vein: Short segment of nonocclusive thrombus is noted just  above the antecubital fossa. Decreased compressibility is seen. Basilic Vein: No evidence of thrombus. Normal compressibility, respiratory phasicity and response to augmentation. Brachial Veins: No evidence of thrombus. Normal compressibility, respiratory phasicity and response to augmentation. Radial Veins: No evidence of thrombus. Normal compressibility, respiratory phasicity and response to augmentation. Ulnar Veins: No evidence of thrombus. Normal compressibility, respiratory phasicity and response to augmentation. Venous Reflux:  None visualized. Other Findings:  None visualized. IMPRESSION: Short segment of nonocclusive thrombus  within cephalic vein near the elbow joint. Electronically Signed   By: Inez Catalina M.D.   On: 06/06/2021 03:08   CT Elbow Left Wo Contrast  Result Date: 06/06/2021 CLINICAL DATA:  Possible radius fracture EXAM: CT OF THE UPPER LEFT EXTREMITY WITHOUT CONTRAST TECHNIQUE: Multidetector CT imaging of the upper left extremity was performed according to the standard protocol. RADIATION DOSE REDUCTION: This exam was performed according to the departmental dose-optimization program which includes automated exposure control, adjustment of the mA and/or kV according to patient size and/or use of iterative reconstruction technique. COMPARISON:  None Available. FINDINGS: Bones/Joint/Cartilage No fracture or dislocation. Ligaments Suboptimally assessed by CT. Muscles and Tendons Unremarkable Soft tissues Small hematoma superficial to the olecranon process. IMPRESSION: 1. No fracture or dislocation. 2. Small hematoma superficial to the olecranon process. Electronically Signed   By: Ulyses Jarred M.D.   On: 06/06/2021 02:39   DG Chest Port 1 View  Result Date: 06/05/2021 CLINICAL DATA:  Weakness.  Left arm pain.  Fever. EXAM: PORTABLE CHEST 1 VIEW COMPARISON:  09/01/2020 FINDINGS: Low lung volumes are present, causing crowding of the pulmonary vasculature. Upper normal heart size. The lungs  appear clear. No blunting of the costophrenic angles. IMPRESSION: 1.  No active cardiopulmonary disease is radiographically apparent. 2. Low lung volumes. Electronically Signed   By: Van Clines M.D.   On: 06/05/2021 21:45   DG Shoulder Left  Result Date: 06/06/2021 CLINICAL DATA:  Atraumatic left arm pain. EXAM: LEFT SHOULDER - 2+ VIEW COMPARISON:  None Available. FINDINGS: There is no evidence of fracture or dislocation. There is no evidence of arthropathy or other focal bone abnormality. Soft tissues are unremarkable. IMPRESSION: Negative. Electronically Signed   By: Virgina Norfolk M.D.   On: 06/06/2021 00:54   DG Knee Complete 4 Views Left  Result Date: 06/05/2021 CLINICAL DATA:  Left knee pain. EXAM: LEFT KNEE - COMPLETE 4+ VIEW COMPARISON:  08/09/2019 FINDINGS: No fracture or dislocation. There is mild medial tibiofemoral joint space narrowing. Minimal peripheral spurring of the patellofemoral medial tibiofemoral compartment as well as tibial spines. Small quadriceps and patellar tendon enthesophytes. Minimal knee joint effusion. No erosion or bone destruction. IMPRESSION: Mild osteoarthritis and minimal joint effusion. No acute osseous abnormality. Electronically Signed   By: Keith Rake M.D.   On: 06/05/2021 22:38     Data Reviewed: Relevant notes from primary care and specialist visits, past discharge summaries as available in EHR, including Care Everywhere. Prior diagnostic testing as pertinent to current admission diagnoses Updated medications and problem lists for reconciliation ED course, including vitals, labs, imaging, treatment and response to treatment Triage notes, nursing and pharmacy notes and ED provider's notes Notable results as noted in HPI   Assessment and Plan: * SIRS of non-infectious origin w acute organ dysfunction ([place]) Acute Gout Colchicine twice daily. Hold off prednisone until gram stain of synovial fluid  Follow-up synovial fluid  culture Consider Ortho consult in the a.m. Pain control Given past history of gout presenting similarly as septic arthritis, will hold off further antibiotics for now   Sepsis, possible([name]) Patient with sepsis criteria but without definite source of infection Possibilities include a septic joint, also has a nonocclusive DVT Repeat lactic more elevated and spite of initial fluids and IV antibiotics so will start empiric antibiotics for sepsis of unknown source    Acute deep vein thrombosis (DVT) of left upper extremity ([name]) Short segment of nonocclusive thrombus within cephalic vein near the elbow joint where there is a  Small hematoma superficial to the olecranon process, probably from an injury We will start anticoagulation for DVT .  AKI (acute kidney injury) ([place]) IV hydration monitor renal function  Paranoid schizophrenia, chronic condition ([place]) Continue Depakote and Clozaril  Hypothyroidism Continue levothyroxine TSH was elevated.  Follow-up free T4  Essential hypertension Continue atenolol  OSA on CPAP Continue CPAP  Diabetes mellitus ([name]) Sliding scale insulin coverage    DVT prophylaxis: Lovenox  Consults: none  Advance Care Planning:   Code Status: Prior   Family Communication: none  Disposition Plan: Back to previous home environment  Severity of Illness: The appropriate patient status for this patient is INPATIENT. Inpatient status is judged to be reasonable and necessary in order to provide the required intensity of service to ensure the patient's safety. The patient's presenting symptoms, physical exam findings, and initial radiographic and laboratory data in the context of their chronic comorbidities is felt to place them at high risk for further clinical deterioration. Furthermore, it is not anticipated that the patient will be medically stable for discharge from the hospital within 2 midnights of admission.   * I certify that at the point of  admission it is my clinical judgment that the patient will require inpatient hospital care spanning beyond 2 midnights from the point of admission due to high intensity of service, high risk for further deterioration and high frequency of surveillance required.*  Author: Athena Masse, MD 06/06/2021 3:18 AM  For on call review [email or web address].

## 2021-06-06 NOTE — ED Notes (Signed)
Resume heparin per Dr Posey Pronto

## 2021-06-06 NOTE — Assessment & Plan Note (Addendum)
Acute gout with SIRS Continue Rocephin and vancomycin Follow-up synovial fluid with culture Consider Ortho consult in the a.m. Pain control

## 2021-06-06 NOTE — Sepsis Progress Note (Signed)
Following for sepsis monitoring ?

## 2021-06-06 NOTE — Assessment & Plan Note (Addendum)
Resolved  IV hydration --> po  monitor renal function

## 2021-06-06 NOTE — Progress Notes (Signed)
Anticoagulation monitoring(Lovenox):  46 yo male ordered Lovenox 40 mg Q24h    Filed Weights   06/05/21 2108  Weight: 132.9 kg (293 lb)   BMI 37   Lab Results  Component Value Date   CREATININE 1.28 (H) 06/05/2021   CREATININE 0.60 (L) 09/03/2020   CREATININE 0.68 09/02/2020   Estimated Creatinine Clearance: 106.8 mL/min (A) (by C-G formula based on SCr of 1.28 mg/dL (H)). Hemoglobin & Hematocrit     Component Value Date/Time   HGB 11.8 (L) 06/05/2021 2120   HGB 12.2 (L) 04/09/2014 1216   HCT 38.6 (L) 06/05/2021 2120   HCT 39.0 (L) 04/09/2014 1216     Per Protocol for Patient with estCrcl > 30 ml/min and BMI > 30, will transition to Lovenox 67.5 mg Q24h.

## 2021-06-06 NOTE — Assessment & Plan Note (Addendum)
   Held atenolol initially d/t soft BP and restarted losartan hoping may have some benfit in gout prevention, but restarted beta blocker given tachycardia

## 2021-06-06 NOTE — Assessment & Plan Note (Addendum)
Short segment of nonocclusive thrombus within cephalic vein near the elbow joint where there is a Small hematoma superficial to the olecranon process, probably from an injury  initial anticoagulation for DVT --> heparin gtt  D/c heparin 06/07 and transition to po anticoagulation w/ Eliquis, pharmacy has confirmed coverage for Rx

## 2021-06-06 NOTE — Assessment & Plan Note (Addendum)
Acute Gout SIRS resolved as of 06/09/21  Synovial fluid L knee (+)intracellular monosodium urate crystals c/w gout Synovial fluid culture negative x2 days   Colchicine twice daily to continue outpatient and starting allopurinol as well   Holding off on prednisone for now given improvement in knee exam   Consider Ortho follow-up outpatient vs consult here if worsening synovial fluid on knee needing drained  Pain control

## 2021-06-06 NOTE — Assessment & Plan Note (Addendum)
Pulmonary HTN likely based on CT chest!   Continue CPAP - pt has hx noncompliance with this here and outpatient, he has been counseled on importance of adherence to this

## 2021-06-06 NOTE — ED Notes (Signed)
Patient remains in radiology for CT scan then to have an Ultrasound

## 2021-06-06 NOTE — Progress Notes (Signed)
ANTICOAGULATION CONSULT NOTE  Pharmacy Consult for Heparin  Indication: DVT  No Known Allergies  Patient Measurements: Height: '6\' 2"'$  (188 cm) Weight: 132.9 kg (293 lb) IBW/kg (Calculated) : 82.2 Heparin Dosing Weight: 111.8 kg   Vital Signs: Temp: 98.1 F (36.7 C) (06/06 1311) Temp Source: Oral (06/06 1311) BP: 126/83 (06/06 1311) Pulse Rate: 105 (06/06 1311)  Labs: Recent Labs    06/05/21 2120 06/05/21 2309 06/06/21 0121 06/06/21 0625  HGB 11.8*  --  10.2*  --   HCT 38.6*  --  34.0*  --   PLT 196  --  168  --   APTT  --   --   --  35  LABPROT  --   --   --  16.5*  INR  --   --   --  1.3*  CREATININE 1.28*  --   --   --   CKTOTAL  --  129  --   --   TROPONINIHS 3 4  --   --      Estimated Creatinine Clearance: 106.8 mL/min (A) (by C-G formula based on SCr of 1.28 mg/dL (H)).   Medical History: 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 tophus 07/20/2019   Morbid obesity ([place])    Paranoid schizophrenia ([place])    Personality disorder ([place])    Sleep apnea    Thyroid disease     Medications:  Medications Prior to Admission  Medication Sig Dispense Refill Last Dose   ACCU-CHEK GUIDE test strip USE TO CHECK BLOOD SUGAR DAILY 100 each 10 supply at supply   Accu-Chek Softclix Lancets lancets USE TO CHECK BLOOD SUGAR DAILY 100 each 12 Supply at Supply   albuterol (PROVENTIL) (2.5 MG/3ML) 0.083% nebulizer solution USE 1 VIAL VIA NEBULIZER 3 TIMES A DAY 300 mL 6 Past Week   Alcohol Swabs (B-D SINGLE USE SWABS REGULAR) PADS USE AS DIRECTED FOR BLOOD SUGAR CHECKS. 100 each 10 Supply at Supply   Asenapine Maleate 10 MG SUBL Place 1 tablet under the tongue daily.   06/05/2021 at 0800   atenolol (TENORMIN) 100 MG tablet TAKE 1 TABLET BY MOUTH DAILY 30 tablet 3 06/05/2021   atorvastatin (LIPITOR) 10 MG tablet TAKE 1 TABLET BY MOUTH DAILY 30 tablet 10 06/05/2021 at 0800   cloZAPine (CLOZARIL)  100 MG tablet Take 150 mg by mouth at bedtime.   06/04/2021 at 2200   divalproex (DEPAKOTE ER) 500 MG 24 hr tablet Take 2,000 mg by mouth at bedtime.    06/04/2021 at 2200   levothyroxine (SYNTHROID) 75 MCG tablet TAKE 1 TABLET BY MOUTH EVERY DAY ON AN EMPTY STOMACH 30 tablet 10 06/05/2021 at 0600   metFORMIN (GLUCOPHAGE) 1000 MG tablet TAKE 1 TABLET BY MOUTH TWICE A DAY WITH FOOD 60 tablet 6 06/05/2021 at 1800   omeprazole (PRILOSEC) 20 MG capsule TAKE 1 CAPSULE BY MOUTH DAILY 30 capsule 6 06/05/2021 at 0800   oxybutynin (DITROPAN) 5 MG tablet Take 5 mg by mouth daily.   06/05/2021 at 0800    Assessment: Pharmacy consulted to dose heparin in this 46 year old male admitted with ACS/NSTEMI.  No prior anticoag noted.   Goal of Therapy:  Heparin level 0.3-0.7 units/ml Monitor platelets by anticoagulation protocol: Yes   Plan:  Heparin level subtherapeutic: heparin 3000 units IV X 1 bolus ordered followed  by adjusting heparin drip to  2300 units/hr. Will order heparin level 6 hrs after rate change  CBC daily while on IV heparin  Dallie Piles 06/06/2021,2:17 PM

## 2021-06-07 ENCOUNTER — Other Ambulatory Visit (HOSPITAL_COMMUNITY): Payer: Self-pay

## 2021-06-07 LAB — MAGNESIUM: Magnesium: 2 mg/dL (ref 1.7–2.4)

## 2021-06-07 LAB — BASIC METABOLIC PANEL
Anion gap: 7 (ref 5–15)
BUN: 11 mg/dL (ref 6–20)
CO2: 28 mmol/L (ref 22–32)
Calcium: 8.5 mg/dL — ABNORMAL LOW (ref 8.9–10.3)
Chloride: 102 mmol/L (ref 98–111)
Creatinine, Ser: 0.62 mg/dL (ref 0.61–1.24)
GFR, Estimated: >60 mL/min (ref >60)
Glucose, Bld: 146 mg/dL — ABNORMAL HIGH (ref 70–99)
Potassium: 4.1 mmol/L (ref 3.5–5.1)
Sodium: 137 mmol/L (ref 135–145)

## 2021-06-07 LAB — PROTEIN, BODY FLUID (OTHER): Total Protein, Body Fluid Other: 0.8 g/dL

## 2021-06-07 LAB — GLUCOSE, CAPILLARY
Glucose-Capillary: 128 mg/dL — ABNORMAL HIGH (ref 70–99)
Glucose-Capillary: 137 mg/dL — ABNORMAL HIGH (ref 70–99)
Glucose-Capillary: 235 mg/dL — ABNORMAL HIGH (ref 70–99)
Glucose-Capillary: 161 mg/dL — ABNORMAL HIGH (ref 70–99)

## 2021-06-07 LAB — CBC
HCT: 34.4 % — ABNORMAL LOW (ref 39.0–52.0)
Hemoglobin: 10.4 g/dL — ABNORMAL LOW (ref 13.0–17.0)
MCH: 25.7 pg — ABNORMAL LOW (ref 26.0–34.0)
MCHC: 30.2 g/dL (ref 30.0–36.0)
MCV: 85.1 fL (ref 80.0–100.0)
Platelets: 175 10*3/uL (ref 150–400)
RBC: 4.04 MIL/uL — ABNORMAL LOW (ref 4.22–5.81)
RDW: 14 % (ref 11.5–15.5)
WBC: 5.9 10*3/uL (ref 4.0–10.5)
nRBC: 0 % (ref 0.0–0.2)

## 2021-06-07 LAB — GLUCOSE, BODY FLUID OTHER: Glucose, Body Fluid Other: 52 mg/dL

## 2021-06-07 LAB — HEPARIN LEVEL (UNFRACTIONATED)
Heparin Unfractionated: 0.34 IU/mL (ref 0.30–0.70)
Heparin Unfractionated: 0.4 IU/mL (ref 0.30–0.70)

## 2021-06-07 LAB — URINE CULTURE: Culture: NO GROWTH

## 2021-06-07 LAB — PROCALCITONIN: Procalcitonin: <0.1 ng/mL

## 2021-06-07 MED ORDER — APIXABAN 5 MG PO TABS
5.0000 mg | ORAL_TABLET | Freq: Two times a day (BID) | ORAL | Status: DC
  Administered 2021-06-07 – 2021-06-12 (×11): 5 mg via ORAL
  Filled 2021-06-07 (×11): qty 1

## 2021-06-07 MED ORDER — VANCOMYCIN HCL 2000 MG/400ML IV SOLN
2000.0000 mg | Freq: Two times a day (BID) | INTRAVENOUS | Status: DC
  Administered 2021-06-07 – 2021-06-08 (×2): 2000 mg via INTRAVENOUS
  Filled 2021-06-07 (×3): qty 400

## 2021-06-07 MED ORDER — LOSARTAN POTASSIUM 25 MG PO TABS
25.0000 mg | ORAL_TABLET | Freq: Every day | ORAL | Status: DC
  Administered 2021-06-07 – 2021-06-08 (×2): 25 mg via ORAL
  Filled 2021-06-07 (×2): qty 1

## 2021-06-07 NOTE — Progress Notes (Signed)
PT placed on CPAP with Auto-setting of 5-18cm and 21% FiO2. Tolerating well at this time.RN informed.

## 2021-06-07 NOTE — Assessment & Plan Note (Addendum)
See above  Will start allopurinol + continue for up to 6 mos (can stop at 3 mos if uric acid levels WNL)

## 2021-06-07 NOTE — TOC Benefit Eligibility Note (Signed)
Patient Advocate Encounter  Insurance verification completed.    The patient is currently admitted and upon discharge could be taking Eliquis 5 mg.  The current 30 day co-pay is, $0.00.   The patient is currently admitted and upon discharge could be taking Xarelto 20 mg.  The current 30 day co-pay is, $0.00.   The patient is insured through [employer] Medicare Part D     Anam Bobby, CPhT Pharmacy Patient Advocate Specialist [facility] Patient Advocate Team Direct Number: [phone]  Fax: [phone]        

## 2021-06-07 NOTE — TOC Initial Note (Signed)
Transition of Care (TOC) - Initial/Assessment Note    Patient Details  Name: John [name]. MRN: [id number] Date of Birth: [birth year]  Transition of Care [facility]) CM/SW Contact:    Pete Pelt, RN Phone Number: 06/07/2021, 10:29 AM  Clinical Narrative:   Patient is from [facility].  Patient plans to return to group home on discharge.                Expected Discharge Plan: Group Home Barriers to Discharge: Continued Medical Work up   Patient Goals and CMS Choice     Choice offered to / list presented to : NA  Expected Discharge Plan and Services Expected Discharge Plan: Group Home       Living arrangements for the past 2 months: Group Home                                      Prior Living Arrangements/Services Living arrangements for the past 2 months: Group Home Lives with:: Facility Resident Patient language and need for interpreter reviewed:: Yes Do you feel safe going back to the place where you live?: Yes      Need for Family Participation in Patient Care: Yes (Comment) Care giver support system in place?: Yes (comment)   Criminal Activity/Legal Involvement Pertinent to Current Situation/Hospitalization: No - Comment as needed  Activities of Daily Living Home Assistive Devices/Equipment: None ADL Screening (condition at time of admission) Patient's cognitive ability adequate to safely complete daily activities?: Yes Is the patient deaf or have difficulty hearing?: No Does the patient have difficulty seeing, even when wearing glasses/contacts?: No Does the patient have difficulty concentrating, remembering, or making decisions?: No Patient able to express need for assistance with ADLs?: Yes Does the patient have difficulty dressing or bathing?: No Independently performs ADLs?: Yes (appropriate for developmental age) Does the patient have difficulty walking or climbing stairs?: No Weakness of Legs: None Weakness of Arms/Hands: None  Permission  Sought/Granted Permission sought to share information with : Case Manager, Customer service manager Permission granted to share information with : Yes, Verbal Permission Granted     Permission granted to share info w AGENCY: [facility]        Emotional Assessment Appearance:: Appears stated age Attitude/Demeanor/Rapport: Gracious, Engaged Affect (typically observed): Pleasant, Appropriate Orientation: : Oriented to Self, Oriented to Place, Oriented to Situation Alcohol / Substance Use: Illicit Drugs Psych Involvement: No (comment)  Admission diagnosis:  Hypomagnesemia [E83.42] Left leg pain [M79.605] Left arm pain [M79.602] SIRS (systemic inflammatory response syndrome) ([name]) [R65.10] Arm DVT (deep venous thromboembolism), acute, left ([name]) [I82.622] Traumatic hematoma of left elbow, initial encounter [S50.02XA] Acute gout of left knee, unspecified cause [M10.9] Sepsis, due to unspecified organism, unspecified whether acute organ dysfunction present [facility]) [A41.9] Patient Active Problem List   Diagnosis Date Noted   Acute deep vein thrombosis (DVT) of left upper extremity ([place]) 06/06/2021   SIRS of non-infectious origin w acute organ dysfunction ([place]) 06/06/2021   SIRS (systemic inflammatory response syndrome) ([place]) 06/06/2021   Gout    Obesity hypoventilation syndrome ([place])    Hyperlipidemia    Septic joint ([place]) 08/29/2020   Community acquired pneumonia 08/29/2020   Bronchitis 08/23/2020   Idiopathic sleep related nonobstructive alveolar hypoventilation 08/23/2020   Sore throat 08/23/2020   OSA treated with BiPAP 08/08/2020   Obesity (BMI 30-39.9) 08/08/2020   OSA on CPAP 05/23/2020  CPAP use counseling 05/23/2020   BMI 36.0-36.9,adult 05/23/2020   Bilateral leg weakness 08/17/2019   Knee arthropathy 08/10/2019   Acute gout due to renal impairment involving right foot 07/08/2019   Encephalopathy acute    Somnolence 11/01/2016   AKI (acute kidney injury)  ([place]) 11/01/2016   Sepsis, possible([name]) 04/27/2016   Respiratory failure ([place]) 12/20/2015   Myocardiopathy ([place]) 11/28/2015   Tobacco abuse 11/28/2015   Acute respiratory failure with hypercapnia ([place])    Sleep apnea    Essential hypertension 11/25/2015   Hypothyroidism 11/25/2015   Paranoid schizophrenia, chronic condition ([place]) 11/25/2015   Acute respiratory failure ([place])    Acute exacerbation of COPD with asthma ([place])    Diabetes mellitus ([place])    Diabetes mellitus due to underlying condition without complication, without long-term current use of insulin ([place])    Morbid obesity ([place]) 09/29/2008   PCP:  Cletis Athens, MD Pharmacy:   [facility], [place] - [address] [place] [address] Phone: [phone] Fax: [place], [place] - [address] [place] [address] Phone: [phone] Fax: [phone]  [id number] [place] [address] [id number]: [phone]: [phone]    Social Determinants of Health (SDOH) Interventions    Readmission Risk Interventions     View : No data to display.

## 2021-06-07 NOTE — Progress Notes (Signed)
PROGRESS NOTE    John [name].  DXA:[id number] DOB: 01-Jan-1976  DOA: 06/05/2021 Date of Service: 06/07/21 PCP: Cletis Athens, MD     Brief Narrative / Hospital Course:  John Cozzolino. is a 46 y.o. male with medical history significant for Schizophrenia, OSA noncompliant with BiPAP, hypertension, asthma, gout bilateral knees, hospitalized in August 2022 with sepsis secondary to suspected right septic knee joint but with negative synovial fluid cultures who presents to the ED from his group home with a complaint of left knee pain as well as left arm pain without any reports of injury. ED course and data review: Tmax 101.4 with pulse 117, respirations 22-26.  Labs with WBC of 9 and lactate 2.1.  Hemoglobin 11.8.  Creatinine 1.28 above baseline of 0.6.  Synovial cell count shows intracellular monosodium urate crystals, 73,000 WBCs 96% neutrophils. Chest x-ray, left knee, shoulder, elbow, wrist XR's as well as CT left elbow and left upper extremity venous Doppler.Marland KitchenLUE Venous doppler: Short segment of nonocclusive thrombus within cephalic vein near the elbow joint Left elbow MC:NOBSJ hematoma superficial to the olecranon process Bilateral knee DG: Mild osteoarthritis and minimal joint effusion. No acute osseous abnormality. Patient was started on an IV fluid bolus per sepsis protocol and started on ceftriaxone and vancomycin for septic arthritis. 06/06: intracellular monosodium urate crystals on synovial cell count c/w gout, pending protein and Glc. Had episode of lowered responsiveness, pinpoint pupils but episode resolved and pt back to baseline, CT head WNL.  06/07: blood, urine cx neg x2 days, synovial cs neg x1 day. D?c heparin gtt and transitioned to Eliquis. Plan to d/c abx if cultures and AM labs show no concerns.    Consultants:  none  Procedures: Synovial fluid drainage L knee in ED     Subjective: Patient reports feeling well today except still having knee pain on the L, though  improved form when he came into ED few days ago.      ASSESSMENT & PLAN:   Principal Problem:   SIRS of non-infectious origin w acute organ dysfunction ([name]) Active Problems:   Sepsis, possible([name])   AKI (acute kidney injury) ([place])   Gout   Acute deep vein thrombosis (DVT) of left upper extremity ([place])   Essential hypertension   Hypothyroidism   Paranoid schizophrenia, chronic condition ([place])   Diabetes mellitus ([place])   OSA on CPAP   SIRS (systemic inflammatory response syndrome) ([name])   AKI (acute kidney injury) ([place]) IV hydration monitor renal function  Suspect septic arthritis of knee, left ([name]) Acute gout with SIRS Continue Rocephin and vancomycin Follow-up synovial fluid with culture Consider Ortho consult in the a.m. Pain control  SIRS of non-infectious origin w acute organ dysfunction ([name]) Acute Gout Synovial fluid L knee (+)intracellular monosodium urate crystals c/w gout Synovial fluid culture negative thus far  Colchicine twice daily.  Start prednisone pending culture final result  Consider Ortho follow-up outpatient vs consult here if persistent synovial fluid on knee needing drained Pain control   Acute deep vein thrombosis (DVT) of left upper extremity ([name]) Short segment of nonocclusive thrombus within cephalic vein near the elbow joint where there is a Small hematoma superficial to the olecranon process, probably from an injury We will start anticoagulation for DVT --> heparin gtt D/c heparin today and transition to po anticoagulation once can confirm coverage for Eliquis/Xarelto or if he will need Coumadin   Essential hypertension Held atenolol started losartan as this can have some gout prevention benefits  Hypothyroidism Continue levothyroxine TSH was elevated.  Follow-up free T4 was WNL increase dose 75 to 88 mcg daily  Follow outpatient  Paranoid schizophrenia, chronic condition ([name]) Continue Depakote and Clozaril  Diabetes mellitus  ([name]) Sliding scale insulin coverage  OSA on CPAP Continue CPAP  Sepsis, possible([name]) Patient with sepsis criteria but without definite source of infection Repeat lactic still elevated but improved Procalcitonin negative Has been on vanc/rocephin Will d/c abx if all cultures come back negative but this seems much more likely realted to gout than sepsis     Gout See above Will start allopurinol + continue colchicine for up to 6 mos (can stop at 3 mos if uric acid levels WNL)     DVT prophylaxis: has been on heparin gtt for LUE DVT, will d/c this today and starting on Eliquis to contineu on d/c  Code Status: FULL Family Communication: none at this time Disposition Plan / TOC needs: back to group home hopefully tomorrow if cultures/labs no concerns Barriers to discharge / significant pending items: continued  workup pending cultures and may need ortho consult for synovial fluid drainage on L knee              Objective: Vitals:   06/07/21 0000 06/07/21 0359 06/07/21 0734 06/07/21 1158  BP:  120/83 (!) 123/95 (!) 146/104  Pulse:  (!) 110 (!) 106 (!) 108  Resp:  '16 18 18  '$ Temp:  98.1 F (36.7 C) 98.6 F (37 C) 98.7 F (37.1 C)  TempSrc:   Oral   SpO2: 94% 94% 91% 94%  Weight:      Height:        Intake/Output Summary (Last 24 hours) at 06/07/2021 1350 Last data filed at 06/07/2021 1106 Gross per 24 hour  Intake 2630.64 ml  Output 1350 ml  Net 1280.64 ml   Filed Weights   06/05/21 2108  Weight: 132.9 kg    Examination: Constitutional:  VS as above General Appearance: alert, well-developed, well-nourished, NAD Eyes: Normal lids and conjunctive, non-icteric sclera Ears, Nose, Mouth, Throat: Normal appearance Neck: No masses, trachea midline Respiratory: Normal respiratory effort Breath sounds normal, no wheeze/rhonchi/rales Cardiovascular: S1/S2 normal, no murmur/rub/gallop auscultated Pedal pulse II/IV bilaterally DP and PT No lower extremity  edema Gastrointestinal: Nontender, no masses Musculoskeletal:  (+)effusion on L knee, normal R knee  No clubbing/cyanosis of digits Neurological: No cranial nerve deficit on limited exam Motor and sensation intact and symmetric Psychiatric: Normal judgment/insight Normal mood and affect       Scheduled Medications:   asenapine  10 mg Sublingual Daily   atorvastatin  10 mg Oral Daily   cloZAPine  150 mg Oral QHS   colchicine  0.6 mg Oral BID   divalproex  2,000 mg Oral QHS   insulin aspart  0-20 Units Subcutaneous TID WC   insulin aspart  0-5 Units Subcutaneous QHS   levothyroxine  75 mcg Oral Q0600   losartan  25 mg Oral Daily   oxybutynin  5 mg Oral Daily   pantoprazole  40 mg Oral Daily    Continuous Infusions:  cefTRIAXone (ROCEPHIN)  IV 2 g (06/06/21 2108)   heparin 2,300 Units/hr (06/07/21 0504)   vancomycin 2,000 mg (06/07/21 1252)    PRN Medications:  acetaminophen **OR** acetaminophen, albuterol, ketorolac, ondansetron **OR** ondansetron (ZOFRAN) IV  Antimicrobials:  Anti-infectives (From admission, onward)    Start     Dose/Rate Route Frequency Ordered Stop   06/07/21 1400  vancomycin (VANCOREADY) IVPB 2000 mg/400 mL  2,000 mg 200 mL/hr over 120 Minutes Intravenous Every 12 hours 06/07/21 1002     06/06/21 2300  cefTRIAXone (ROCEPHIN) 1 g in sodium chloride 0.9 % 100 mL IVPB  Status:  Discontinued        1 g 200 mL/hr over 30 Minutes Intravenous Every 24 hours 06/06/21 0520 06/06/21 0725   06/06/21 1600  vancomycin (VANCOREADY) IVPB 1250 mg/250 mL  Status:  Discontinued        1,250 mg 166.7 mL/hr over 90 Minutes Intravenous Every 12 hours 06/06/21 0635 06/07/21 1002   06/06/21 0200  vancomycin (VANCOCIN) IVPB 1000 mg/200 mL premix       See Hyperspace for full Linked Orders Report.   1,000 mg 200 mL/hr over 60 Minutes Intravenous  Once 06/06/21 0153 06/06/21 0450   06/06/21 0200  vancomycin (VANCOREADY) IVPB 1500 mg/300 mL       See  Hyperspace for full Linked Orders Report.   1,500 mg 150 mL/hr over 120 Minutes Intravenous  Once 06/06/21 0153 06/06/21 0907   06/05/21 2215  cefTRIAXone (ROCEPHIN) 2 g in sodium chloride 0.9 % 100 mL IVPB        2 g 200 mL/hr over 30 Minutes Intravenous Every 24 hours 06/05/21 2200 06/12/21 2214       Data Reviewed: I have personally reviewed following labs and imaging studies  CBC: Recent Labs  Lab 06/05/21 2120 06/06/21 0121 06/07/21 0550  WBC 9.0 9.1 5.9  NEUTROABS  --  5.5  --   HGB 11.8* 10.2* 10.4*  HCT 38.6* 34.0* 34.4*  MCV 84.6 86.5 85.1  PLT 196 168 119   Basic Metabolic Panel: Recent Labs  Lab 06/05/21 2120 06/05/21 2309 06/07/21 0550  NA 133*  --  137  K 4.0  --  4.1  CL 98  --  102  CO2 26  --  28  GLUCOSE 158*  --  146*  BUN 22*  --  11  CREATININE 1.28*  --  0.62  CALCIUM 8.8*  --  8.5*  MG  --  1.6* 2.0   GFR: Estimated Creatinine Clearance: 170.8 mL/min (by C-G formula based on SCr of 0.62 mg/dL). Liver Function Tests: Recent Labs  Lab 06/05/21 2120  AST 13*  ALT 9  ALKPHOS 42  BILITOT 0.6  PROT 8.3*  ALBUMIN 3.3*   Recent Labs  Lab 06/05/21 2120  LIPASE 24   No results for input(s): AMMONIA in the last 168 hours. Coagulation Profile: Recent Labs  Lab 06/06/21 0625  INR 1.3*   Cardiac Enzymes: Recent Labs  Lab 06/05/21 2309  CKTOTAL 129   BNP (last 3 results) No results for input(s): PROBNP in the last 8760 hours. HbA1C: Recent Labs    06/06/21 0121  HGBA1C 6.5*   CBG: Recent Labs  Lab 06/06/21 1116 06/06/21 1552 06/06/21 2023 06/07/21 0734 06/07/21 1157  GLUCAP 89 130* 153* 128* 137*   Lipid Profile: No results for input(s): CHOL, HDL, LDLCALC, TRIG, CHOLHDL, LDLDIRECT in the last 72 hours. Thyroid Function Tests: Recent Labs    06/05/21 2120 06/05/21 2309  TSH 5.613*  --   FREET4  --  1.05   Anemia Panel: No results for input(s): VITAMINB12, FOLATE, FERRITIN, TIBC, IRON, RETICCTPCT in the last 72  hours. Urine analysis:    Component Value Date/Time   COLORURINE YELLOW (A) 06/06/2021 0121   APPEARANCEUR CLEAR (A) 06/06/2021 0121   APPEARANCEUR Clear 04/09/2014 1215   LABSPEC 1.009 06/06/2021 0121   LABSPEC 1.015  04/09/2014 1215   PHURINE 5.0 06/06/2021 0121   GLUCOSEU NEGATIVE 06/06/2021 0121   GLUCOSEU Negative 04/09/2014 1215   HGBUR NEGATIVE 06/06/2021 0121   BILIRUBINUR NEGATIVE 06/06/2021 0121   BILIRUBINUR Negative 04/09/2014 1215   KETONESUR NEGATIVE 06/06/2021 0121   PROTEINUR NEGATIVE 06/06/2021 0121   UROBILINOGEN 1.0 10/23/2011 1845   NITRITE NEGATIVE 06/06/2021 0121   LEUKOCYTESUR NEGATIVE 06/06/2021 0121   LEUKOCYTESUR Negative 04/09/2014 1215   Sepsis Labs: '@LABRCNTIP'$ (procalcitonin:4,lacticidven:4)  Recent Results (from the past 240 hour(s))  Blood Culture (routine x 2)     Status: None (Preliminary result)   Collection Time: 06/05/21 10:38 PM   Specimen: BLOOD  Result Value Ref Range Status   Specimen Description BLOOD RIGHT HAND  Final   Special Requests   Final    BOTTLES DRAWN AEROBIC AND ANAEROBIC Blood Culture adequate volume   Culture   Final    NO GROWTH 2 DAYS Performed at [facility], [address] [place], [place] [address]    Report Status PENDING  Incomplete  Blood Culture (routine x 2)     Status: None (Preliminary result)   Collection Time: 06/05/21 10:45 PM   Specimen: BLOOD  Result Value Ref Range Status   Specimen Description BLOOD RIGHT ASSIST CONTROL  Final   Special Requests   Final    BOTTLES DRAWN AEROBIC AND ANAEROBIC Blood Culture adequate volume   Culture   Final    NO GROWTH 2 DAYS Performed at [facility], [address] [place], [place] [address]    Report Status PENDING  Incomplete  SARS Coronavirus 2 by RT PCR (hospital order, performed in [place] hospital lab) *cepheid single result test* Anterior Nasal Swab     Status: None   Collection Time: 06/05/21 11:38 PM   Specimen: Anterior  Nasal Swab  Result Value Ref Range Status   SARS Coronavirus 2 by RT PCR NEGATIVE NEGATIVE Final    Comment: (NOTE) SARS-CoV-2 target nucleic acids are NOT DETECTED.  The SARS-CoV-2 RNA is generally detectable in upper and lower respiratory specimens during the acute phase of infection. The lowest concentration of SARS-CoV-2 viral copies this assay can detect is 250 copies / mL. A negative result does not preclude SARS-CoV-2 infection and should not be used as the sole basis for treatment or other patient management decisions.  A negative result may occur with improper specimen collection / handling, submission of specimen other than nasopharyngeal swab, presence of viral mutation(s) within the areas targeted by this assay, and inadequate number of viral copies (<250 copies / mL). A negative result must be combined with clinical observations, patient history, and epidemiological information.  Fact Sheet for Patients:   https://www.patel.info/  Fact Sheet for Healthcare Providers: https://hall.com/  This test is not yet approved or  cleared by the [place] FDA and has been authorized for detection and/or diagnosis of SARS-CoV-2 by FDA under an Emergency Use Authorization (EUA).  This EUA will remain in effect (meaning this test can be used) for the duration of the COVID-19 declaration under Section 564(b)(1) of the Act, 21 U.S.C. section 360bbb-3(b)(1), unless the authorization is terminated or revoked sooner.  Performed at [facility], [address], [place], [place] [address]   Urine Culture     Status: None   Collection Time: 06/06/21  1:21 AM   Specimen: In/Out Cath Urine  Result Value Ref Range Status   Specimen Description   Final    IN/OUT CATH URINE Performed at  [facility], [address] [place], [place] [address]    Special Requests   Final    NONE Performed at [facility], [address], [place], [place] [address]    Culture   Final    NO GROWTH Performed at [facility], [place] [address], [place], [place] [address]    Report Status 06/07/2021 FINAL  Final  Body fluid culture w Gram Stain     Status: None (Preliminary result)   Collection Time: 06/06/21  1:21 AM   Specimen: KNEE; Body Fluid  Result Value Ref Range Status   Specimen Description   Final    KNEE LEFT Performed at [facility], [address] [place], [place] [address]    Special Requests   Final    NONE Performed at [facility], [address] [place], [place] [address]    Gram Stain   Final    NO SQUAMOUS EPITHELIAL CELLS SEEN ABUNDANT WBC PRESENT, PREDOMINANTLY PMN NO ORGANISMS SEEN    Culture   Final    NO GROWTH 1 DAY Performed at [facility], [place] [address] [place], [place] [address]    Report Status PENDING  Incomplete         Radiology Studies last 96 hours: DG Elbow Complete Left  Result Date: 06/06/2021 CLINICAL DATA:  Atraumatic left arm pain. EXAM: LEFT ELBOW - COMPLETE 3+ VIEW COMPARISON:  None Available. FINDINGS: A very small area of cortical irregularity is seen along the volar aspect of the left radial head. This is of indeterminate age. There is no evidence of dislocation. A moderate to large bony spur is seen along the olecranon process of the proximal left ulna. Mild soft tissue swelling is seen along the dorsal aspect of the olecranon process. IMPRESSION: 1. Findings which may represent a very small left radial head fracture, of indeterminate age. Correlation with physical examination is recommended to determine the presence of point tenderness. 2. Degenerative changes with mild dorsal soft tissue swelling. Electronically Signed   By: Virgina Norfolk M.D.   On: 06/06/2021 00:58   DG Wrist Complete Left  Result Date: 06/06/2021 CLINICAL DATA:  Atraumatic left arm pain. EXAM: LEFT WRIST - COMPLETE 3+ VIEW COMPARISON:  None  Available. FINDINGS: There is no evidence of an acute fracture or dislocation. Two radiopaque surgical clips are seen within the fourth left metacarpal. There is no evidence of arthropathy or other focal bone abnormality. Soft tissues are unremarkable. IMPRESSION: No acute osseous abnormality. Electronically Signed   By: Virgina Norfolk M.D.   On: 06/06/2021 00:59   CT HEAD WO CONTRAST (5MM)  Result Date: 06/06/2021 CLINICAL DATA:  Mental status change, unknown cause. Weakness and left arm pain. EXAM: CT HEAD WITHOUT CONTRAST TECHNIQUE: Contiguous axial images were obtained from the base of the skull through the vertex without intravenous contrast. RADIATION DOSE REDUCTION: This exam was performed according to the departmental dose-optimization program which includes automated exposure control, adjustment of the mA and/or kV according to patient size and/or use of iterative reconstruction technique. COMPARISON:  Head CT 12/22/2019 FINDINGS: Brain: There is no evidence of an acute infarct, intracranial hemorrhage, mass, midline shift, or extra-axial fluid collection. The ventricles and sulci are borderline prominent for age which may reflect early cerebral atrophy. Vascular: No hyperdense vessel. Skull: No fracture or suspicious osseous lesion. Sinuses/Orbits: Trace fluid in the maxillary sinuses. Clear mastoid air cells. Unremarkable orbits. Other: None. IMPRESSION: No evidence of acute intracranial  abnormality. Electronically Signed   By: Logan Bores M.D.   On: 06/06/2021 11:48   US Venous Img Upper Uni Left  Result Date: 06/06/2021 CLINICAL DATA:  History of left arm pain for 3 days EXAM: LEFT UPPER EXTREMITY VENOUS DOPPLER ULTRASOUND TECHNIQUE: Gray-scale sonography with graded compression, as well as color Doppler and duplex ultrasound were performed to evaluate the upper extremity deep venous system from the level of the subclavian vein and including the jugular, axillary, basilic, radial, ulnar and  upper cephalic vein. Spectral Doppler was utilized to evaluate flow at rest and with distal augmentation maneuvers. COMPARISON:  None Available. FINDINGS: Contralateral Subclavian Vein: Respiratory phasicity is normal and symmetric with the symptomatic side. No evidence of thrombus. Normal compressibility. Internal Jugular Vein: No evidence of thrombus. Normal compressibility, respiratory phasicity and response to augmentation. Subclavian Vein: No evidence of thrombus. Normal compressibility, respiratory phasicity and response to augmentation. Axillary Vein: No evidence of thrombus. Normal compressibility, respiratory phasicity and response to augmentation. Cephalic Vein: Short segment of nonocclusive thrombus is noted just above the antecubital fossa. Decreased compressibility is seen. Basilic Vein: No evidence of thrombus. Normal compressibility, respiratory phasicity and response to augmentation. Brachial Veins: No evidence of thrombus. Normal compressibility, respiratory phasicity and response to augmentation. Radial Veins: No evidence of thrombus. Normal compressibility, respiratory phasicity and response to augmentation. Ulnar Veins: No evidence of thrombus. Normal compressibility, respiratory phasicity and response to augmentation. Venous Reflux:  None visualized. Other Findings:  None visualized. IMPRESSION: Short segment of nonocclusive thrombus within cephalic vein near the elbow joint. Electronically Signed   By: Inez Catalina M.D.   On: 06/06/2021 03:08   CT Elbow Left Wo Contrast  Result Date: 06/06/2021 CLINICAL DATA:  Possible radius fracture EXAM: CT OF THE UPPER LEFT EXTREMITY WITHOUT CONTRAST TECHNIQUE: Multidetector CT imaging of the upper left extremity was performed according to the standard protocol. RADIATION DOSE REDUCTION: This exam was performed according to the departmental dose-optimization program which includes automated exposure control, adjustment of the mA and/or kV according to  patient size and/or use of iterative reconstruction technique. COMPARISON:  None Available. FINDINGS: Bones/Joint/Cartilage No fracture or dislocation. Ligaments Suboptimally assessed by CT. Muscles and Tendons Unremarkable Soft tissues Small hematoma superficial to the olecranon process. IMPRESSION: 1. No fracture or dislocation. 2. Small hematoma superficial to the olecranon process. Electronically Signed   By: Ulyses Jarred M.D.   On: 06/06/2021 02:39   DG Chest Port 1 View  Result Date: 06/05/2021 CLINICAL DATA:  Weakness.  Left arm pain.  Fever. EXAM: PORTABLE CHEST 1 VIEW COMPARISON:  09/01/2020 FINDINGS: Low lung volumes are present, causing crowding of the pulmonary vasculature. Upper normal heart size. The lungs appear clear. No blunting of the costophrenic angles. IMPRESSION: 1.  No active cardiopulmonary disease is radiographically apparent. 2. Low lung volumes. Electronically Signed   By: Van Clines M.D.   On: 06/05/2021 21:45   DG Shoulder Left  Result Date: 06/06/2021 CLINICAL DATA:  Atraumatic left arm pain. EXAM: LEFT SHOULDER - 2+ VIEW COMPARISON:  None Available. FINDINGS: There is no evidence of fracture or dislocation. There is no evidence of arthropathy or other focal bone abnormality. Soft tissues are unremarkable. IMPRESSION: Negative. Electronically Signed   By: Virgina Norfolk M.D.   On: 06/06/2021 00:54   DG Knee Complete 4 Views Left  Result Date: 06/05/2021 CLINICAL DATA:  Left knee pain. EXAM: LEFT KNEE - COMPLETE 4+ VIEW COMPARISON:  08/09/2019 FINDINGS: No fracture or dislocation. There is mild medial  tibiofemoral joint space narrowing. Minimal peripheral spurring of the patellofemoral medial tibiofemoral compartment as well as tibial spines. Small quadriceps and patellar tendon enthesophytes. Minimal knee joint effusion. No erosion or bone destruction. IMPRESSION: Mild osteoarthritis and minimal joint effusion. No acute osseous abnormality. Electronically Signed    By: Keith Rake M.D.   On: 06/05/2021 22:38            LOS: 1 day    Time spent: 35 min    Emeterio Reeve, DO [facility] 06/07/2021, 1:50 PM   Staff may message me via secure chat in [place]  but this may not receive immediate response,  please page for urgent matters!  If 7PM-7AM, please contact night-coverage [email or web address]  Dictation software was used to generate the above note. Typos may occur and escape review, as with typed/written notes. Please contact Dr Sheppard Coil directly for clarity if needed.

## 2021-06-07 NOTE — Progress Notes (Signed)
ANTICOAGULATION CONSULT NOTE  Pharmacy Consult for Heparin  Indication: DVT  No Known Allergies  Patient Measurements: Height: '6\' 2"'$  (188 cm) Weight: 132.9 kg (293 lb) IBW/kg (Calculated) : 82.2 Heparin Dosing Weight: 111.8 kg   Vital Signs: Temp: 98.1 F (36.7 C) (06/07 0359) BP: 120/83 (06/07 0359) Pulse Rate: 110 (06/07 0359)  Labs: Recent Labs    06/05/21 2120 06/05/21 2309 06/06/21 0121 06/06/21 0625 06/06/21 1912 06/07/21 0550  HGB 11.8*  --  10.2*  --   --  10.4*  HCT 38.6*  --  34.0*  --   --  34.4*  PLT 196  --  168  --   --  175  APTT  --   --   --  35  --   --   LABPROT  --   --   --  16.5*  --   --   INR  --   --   --  1.3*  --   --   HEPARINUNFRC  --   --   --   --  0.19* 0.34  CREATININE 1.28*  --   --   --   --  0.62  CKTOTAL  --  129  --   --   --   --   TROPONINIHS 3 4  --   --   --   --      Estimated Creatinine Clearance: 170.8 mL/min (by C-G formula based on SCr of 0.62 mg/dL).   Medical History: 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 tophus 07/20/2019   Morbid obesity ([place])    Paranoid schizophrenia ([place])    Personality disorder ([place])    Sleep apnea    Thyroid disease     Medications:  Medications Prior to Admission  Medication Sig Dispense Refill Last Dose   ACCU-CHEK GUIDE test strip USE TO CHECK BLOOD SUGAR DAILY 100 each 10 supply at supply   Accu-Chek Softclix Lancets lancets USE TO CHECK BLOOD SUGAR DAILY 100 each 12 Supply at Supply   albuterol (PROVENTIL) (2.5 MG/3ML) 0.083% nebulizer solution USE 1 VIAL VIA NEBULIZER 3 TIMES A DAY 300 mL 6 Past Week   Alcohol Swabs (B-D SINGLE USE SWABS REGULAR) PADS USE AS DIRECTED FOR BLOOD SUGAR CHECKS. 100 each 10 Supply at Supply   Asenapine Maleate 10 MG SUBL Place 1 tablet under the tongue daily.   06/05/2021 at 0800   atenolol (TENORMIN) 100 MG tablet TAKE 1 TABLET BY MOUTH DAILY 30 tablet 3  06/05/2021   atorvastatin (LIPITOR) 10 MG tablet TAKE 1 TABLET BY MOUTH DAILY 30 tablet 10 06/05/2021 at 0800   cloZAPine (CLOZARIL) 100 MG tablet Take 150 mg by mouth at bedtime.   06/04/2021 at 2200   divalproex (DEPAKOTE ER) 500 MG 24 hr tablet Take 2,000 mg by mouth at bedtime.    06/04/2021 at 2200   levothyroxine (SYNTHROID) 75 MCG tablet TAKE 1 TABLET BY MOUTH EVERY DAY ON AN EMPTY STOMACH 30 tablet 10 06/05/2021 at 0600   metFORMIN (GLUCOPHAGE) 1000 MG tablet TAKE 1 TABLET BY MOUTH TWICE A DAY WITH FOOD 60 tablet 6 06/05/2021 at 1800   omeprazole (PRILOSEC) 20 MG capsule TAKE 1 CAPSULE BY MOUTH DAILY 30 capsule 6 06/05/2021 at 0800   oxybutynin (DITROPAN) 5 MG tablet Take 5 mg by mouth daily.   06/05/2021 at 0800  Assessment: Pharmacy consulted to dose heparin in this 46 year old male admitted with ACS/NSTEMI.  No prior anticoag noted.   Goal of Therapy:  Heparin level 0.3-0.7 units/ml Monitor platelets by anticoagulation protocol: Yes   Plan:  6/7:  HL @ 0550 = 0.34, therapeutic X 1 Will continue pt on current rate and draw confirmation level on 6/7 @ 1200.  Vicent Febles D 06/07/2021,6:53 AM

## 2021-06-07 NOTE — Progress Notes (Signed)
ANTICOAGULATION CONSULT NOTE  Pharmacy Consult for Heparin  Indication: DVT  No Known Allergies  Patient Measurements: Height: '6\' 2"'$  (188 cm) Weight: 132.9 kg (293 lb) IBW/kg (Calculated) : 82.2 Heparin Dosing Weight: 111.8 kg   Vital Signs: Temp: 98.7 F (37.1 C) (06/07 1158) Temp Source: Oral (06/07 0734) BP: 146/104 (06/07 1158) Pulse Rate: 108 (06/07 1158)  Labs: Recent Labs    06/05/21 2120 06/05/21 2309 06/06/21 0121 06/06/21 0625 06/06/21 1912 06/07/21 0550 06/07/21 1207  HGB 11.8*  --  10.2*  --   --  10.4*  --   HCT 38.6*  --  34.0*  --   --  34.4*  --   PLT 196  --  168  --   --  175  --   APTT  --   --   --  35  --   --   --   LABPROT  --   --   --  16.5*  --   --   --   INR  --   --   --  1.3*  --   --   --   HEPARINUNFRC  --   --   --   --  0.19* 0.34 0.40  CREATININE 1.28*  --   --   --   --  0.62  --   CKTOTAL  --  129  --   --   --   --   --   TROPONINIHS 3 4  --   --   --   --   --      Estimated Creatinine Clearance: 170.8 mL/min (by C-G formula based on SCr of 0.62 mg/dL).   Medical History: 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 tophus 07/20/2019   Morbid obesity ([place])    Paranoid schizophrenia ([place])    Personality disorder ([place])    Sleep apnea    Thyroid disease     Medications:  Medications Prior to Admission  Medication Sig Dispense Refill Last Dose   ACCU-CHEK GUIDE test strip USE TO CHECK BLOOD SUGAR DAILY 100 each 10 supply at supply   Accu-Chek Softclix Lancets lancets USE TO CHECK BLOOD SUGAR DAILY 100 each 12 Supply at Supply   albuterol (PROVENTIL) (2.5 MG/3ML) 0.083% nebulizer solution USE 1 VIAL VIA NEBULIZER 3 TIMES A DAY 300 mL 6 Past Week   Alcohol Swabs (B-D SINGLE USE SWABS REGULAR) PADS USE AS DIRECTED FOR BLOOD SUGAR CHECKS. 100 each 10 Supply at Supply   Asenapine Maleate 10 MG SUBL Place 1 tablet under the tongue daily.    06/05/2021 at 0800   atenolol (TENORMIN) 100 MG tablet TAKE 1 TABLET BY MOUTH DAILY 30 tablet 3 06/05/2021   atorvastatin (LIPITOR) 10 MG tablet TAKE 1 TABLET BY MOUTH DAILY 30 tablet 10 06/05/2021 at 0800   cloZAPine (CLOZARIL) 100 MG tablet Take 150 mg by mouth at bedtime.   06/04/2021 at 2200   divalproex (DEPAKOTE ER) 500 MG 24 hr tablet Take 2,000 mg by mouth at bedtime.    06/04/2021 at 2200   levothyroxine (SYNTHROID) 75 MCG tablet TAKE 1 TABLET BY MOUTH EVERY DAY ON AN EMPTY STOMACH 30 tablet 10 06/05/2021 at 0600   metFORMIN (GLUCOPHAGE) 1000 MG tablet TAKE 1 TABLET BY MOUTH TWICE A DAY WITH FOOD 60 tablet 6 06/05/2021 at 1800   omeprazole (PRILOSEC)  20 MG capsule TAKE 1 CAPSULE BY MOUTH DAILY 30 capsule 6 06/05/2021 at 0800   oxybutynin (DITROPAN) 5 MG tablet Take 5 mg by mouth daily.   06/05/2021 at 0800    Assessment: Pharmacy consulted to dose heparin for VTE treatement in this 46 year old male admitted with SIRS. No prior anticoag noted.   6/7: 0550 HL= 0.34, therapeutic X 1 6/7 1207 HL=0.40  therapeutic x 2  cont. 2300 units/hr  Goal of Therapy:  Heparin level 0.3-0.7 units/ml Monitor platelets by anticoagulation protocol: Yes   Plan:  6/7 1207 HL=0.40  therapeutic x 2 Will continue pt on current rate of 2300 units/hr and check next HL with am labs CBC daily  Tierria Watson A 06/07/2021,12:57 PM

## 2021-06-07 NOTE — Progress Notes (Signed)
Pharmacy Antibiotic Note  John [name]. is a 46 y.o. male admitted on 06/05/2021 with cellulitis/?septic knee. Pharmacy has been consulted for Vancomycin dosing.  Plan: Scr improved 1.28>>0.62 Will adjust Vancomycin from 1250 mg IV Q12H to 2000 mg IV q12h  AUC = 509 Vanc trough = 12.2 Scr used=0.8 (actual 0.62) Vd=0.5    BMI 37  F/u renal fxn, cultures  Height: '6\' 2"'$  (188 cm) Weight: 132.9 kg (293 lb) IBW/kg (Calculated) : 82.2  Temp (24hrs), Avg:98.2 F (36.8 C), Min:97.7 F (36.5 C), Max:98.6 F (37 C)  Recent Labs  Lab 06/05/21 2120 06/05/21 2238 06/06/21 0121 06/07/21 0550  WBC 9.0  --  9.1 5.9  CREATININE 1.28*  --   --  0.62  LATICACIDVEN  --  2.4* 2.1*  --      Estimated Creatinine Clearance: 170.8 mL/min (by C-G formula based on SCr of 0.62 mg/dL).    No Known Allergies  Antimicrobials this admission:  Vancomycin 6/6 >> CTX 6/5 (evening)>>  Dose adjustments this admission: 6/7 Vanc 1250 q12h to '2000mg'$  q12h  Microbiology results:  BCx: NG x 2d  6/6 UCx:  NG 6/6 knee cx: pend  Sputum:    MRSA PCR:  Thank you for allowing pharmacy to be a part of this patient's care.  Karryn Kosinski A 06/07/2021 9:52 AM

## 2021-06-08 ENCOUNTER — Inpatient Hospital Stay: Payer: 59

## 2021-06-08 ENCOUNTER — Encounter: Payer: Self-pay | Admitting: Internal Medicine

## 2021-06-08 LAB — BASIC METABOLIC PANEL
Anion gap: 6 (ref 5–15)
BUN: 12 mg/dL (ref 6–20)
CO2: 28 mmol/L (ref 22–32)
Calcium: 8.5 mg/dL — ABNORMAL LOW (ref 8.9–10.3)
Chloride: 103 mmol/L (ref 98–111)
Creatinine, Ser: 0.67 mg/dL (ref 0.61–1.24)
GFR, Estimated: >60 mL/min (ref >60)
Glucose, Bld: 149 mg/dL — ABNORMAL HIGH (ref 70–99)
Potassium: 4.4 mmol/L (ref 3.5–5.1)
Sodium: 137 mmol/L (ref 135–145)

## 2021-06-08 LAB — GLUCOSE, CAPILLARY
Glucose-Capillary: 124 mg/dL — ABNORMAL HIGH (ref 70–99)
Glucose-Capillary: 170 mg/dL — ABNORMAL HIGH (ref 70–99)
Glucose-Capillary: 146 mg/dL — ABNORMAL HIGH (ref 70–99)
Glucose-Capillary: 172 mg/dL — ABNORMAL HIGH (ref 70–99)

## 2021-06-08 LAB — CBC
HCT: 35.8 % — ABNORMAL LOW (ref 39.0–52.0)
Hemoglobin: 10.8 g/dL — ABNORMAL LOW (ref 13.0–17.0)
MCH: 26 pg (ref 26.0–34.0)
MCHC: 30.2 g/dL (ref 30.0–36.0)
MCV: 86.3 fL (ref 80.0–100.0)
Platelets: 208 10*3/uL (ref 150–400)
RBC: 4.15 MIL/uL — ABNORMAL LOW (ref 4.22–5.81)
RDW: 13.9 % (ref 11.5–15.5)
WBC: 6 10*3/uL (ref 4.0–10.5)
nRBC: 0 % (ref 0.0–0.2)

## 2021-06-08 LAB — HEPARIN LEVEL (UNFRACTIONATED): Heparin Unfractionated: >1.1 IU/mL — ABNORMAL HIGH (ref 0.30–0.70)

## 2021-06-08 LAB — LACTIC ACID, PLASMA: Lactic Acid, Venous: 1.1 mmol/L (ref 0.5–1.9)

## 2021-06-08 MED ORDER — ATENOLOL 100 MG PO TABS
100.0000 mg | ORAL_TABLET | Freq: Every day | ORAL | Status: DC
  Administered 2021-06-08 – 2021-06-12 (×5): 100 mg via ORAL
  Filled 2021-06-08 (×5): qty 1

## 2021-06-08 MED ORDER — IOHEXOL 350 MG/ML SOLN
75.0000 mL | Freq: Once | INTRAVENOUS | Status: AC | PRN
  Administered 2021-06-08: 75 mL via INTRAVENOUS

## 2021-06-08 NOTE — Progress Notes (Signed)
PROGRESS NOTE    John [name].  WCH:[id number] DOB: 18-Aug-1975  DOA: 06/05/2021 Date of Service: 06/08/21 PCP: Cletis Athens, MD     Brief Narrative / Hospital Course:  John Millan. is a 46 y.o. male with medical history significant for Schizophrenia, OSA noncompliant with BiPAP, hypertension, asthma, gout bilateral knees, hospitalized in August 2022 with sepsis secondary to suspected right septic knee joint but with negative synovial fluid cultures who presents to the ED from his group home with a complaint of left knee pain as well as left arm pain without any reports of injury. ED course and data review: Tmax 101.4 with pulse 117, respirations 22-26.  Labs with WBC of 9 and lactate 2.1.  Hemoglobin 11.8.  Creatinine 1.28 above baseline of 0.6.  Synovial cell count shows intracellular monosodium urate crystals, 73,000 WBCs 96% neutrophils. Chest x-ray, left knee, shoulder, elbow, wrist XR's as well as CT left elbow and left upper extremity venous Doppler.Marland KitchenLUE Venous doppler: Short segment of nonocclusive thrombus within cephalic vein near the elbow joint Left elbow PN:TIRWE hematoma superficial to the olecranon process Bilateral knee DG: Mild osteoarthritis and minimal joint effusion. No acute osseous abnormality. Patient was started on an IV fluid bolus per sepsis protocol and started on ceftriaxone and vancomycin for septic arthritis. 06/06: intracellular monosodium urate crystals on synovial cell count c/w gout, pending protein and Glc. Had episode of lowered responsiveness, pinpoint pupils but episode resolved and pt back to baseline, CT head WNL.  06/07: blood, urine cx neg x2 days, synovial cs neg x1 day. D?c heparin gtt and transitioned to Eliquis. Plan to d/c abx if cultures and AM labs show no concerns.   06/08: L knee synovial cultures negative and effusion less on physical exam. IV abx d/c. Remains asymptomatic tachycardia about same as on arrival to ED, records reviewed and he  is not tachy at baseline per PCP notes. CTA chest  done and negative for PE, it did show enlarged pulmonary artery concerning for pulmonary HTN, also fluid in esophagus risk for aspiration but lungs otherwise appear WNL.   Consultants:  none  Procedures: Synovial fluid drainage L knee in ED     Subjective: Patient reports feeling well today except still having knee pain on the L, though improved form when he came into ED few days ago. Sleepy but alert enough to participate in interview, he does not feel any racing heart / SOB despite tachycardia.       ASSESSMENT & PLAN:   Principal Problem:   SIRS of non-infectious origin w acute organ dysfunction ([name]) Active Problems:   Sepsis, possible([name])   AKI (acute kidney injury) ([name])   Gout   Acute deep vein thrombosis (DVT) of left upper extremity ([name])   Essential hypertension   Hypothyroidism   Paranoid schizophrenia, chronic condition ([name])   Diabetes mellitus ([name])   OSA on CPAP   SIRS (systemic inflammatory response syndrome) ([name])   AKI (acute kidney injury) ([name]) IV hydration --> po monitor renal function  Suspect septic arthritis of knee, left ([name]) Acute gout with SIRS Continue Rocephin and vancomycin Follow-up synovial fluid with culture Consider Ortho consult in the a.m. Pain control  SIRS of non-infectious origin w acute organ dysfunction ([name]) Acute Gout Synovial fluid L knee (+)intracellular monosodium urate crystals c/w gout Synovial fluid culture negative x2 days  Colchicine twice daily.  Holding off on prednisone for now given improvement in knee exam and given tachycardia  Consider Ortho follow-up outpatient  vs consult here if worsening synovial fluid on knee needing drained - today appears reduced  Pain control  Acute deep vein thrombosis (DVT) of left upper extremity ([name]) Short segment of nonocclusive thrombus within cephalic vein near the elbow joint where there is a Small hematoma superficial to  the olecranon process, probably from an injury initial anticoagulation for DVT --> heparin gtt D/c heparin 06/07 and transition to po anticoagulation once confirmed coverage for Rx  Essential hypertension Held atenolol initially d/t soft BP and restarted losartan hoping may have some benfit in gout prevention, but restarting beta blocker today given tachycardia  Hypothyroidism TSH was elevated.  Follow-up free T4 was WNL Continue levothyroxine but increased dose 75 to 88 mcg daily  Follow outpatient  Paranoid schizophrenia, chronic condition ([name]) Continue Depakote and Clozaril  Diabetes mellitus ([name]) Sliding scale insulin coverage  OSA on CPAP Continue CPAP - pt has hx noncompliance with this here and outpatient  Sepsis, possible([name]) Patient with sepsis criteria on admission but without definite source of infection Repeat lactic improved Procalcitonin negative Has been on vanc/rocephin --> d/c abx  Gout See above Will start allopurinol + continue for up to 6 mos (can stop at 3 mos if uric acid levels WNL)     DVT prophylaxis: on Eliquis to contineu on d/c for DVT Code Status: FULL Family Communication: none at this time Disposition Plan / TOC needs: back to group home hopefully tomorrow if HR improves Barriers to discharge / significant pending items: unexplained worsening tachycardia today may need echo/cardiology consult if not better back on home beta blocker              Objective: Vitals:   06/08/21 0730 06/08/21 1130 06/08/21 1201 06/08/21 1538  BP: 129/84 124/84 132/88 (!) 131/91  Pulse: (!) 116 (!) 118 (!) 118 (!) 120  Resp: 19 (!) '22 20 20  '$ Temp: 98.8 F (37.1 C) 98.4 F (36.9 C)    TempSrc: Oral Oral    SpO2: 95%  95% 95%  Weight:      Height:        Intake/Output Summary (Last 24 hours) at 06/08/2021 1714 Last data filed at 06/08/2021 1412 Gross per 24 hour  Intake 849.01 ml  Output 250 ml  Net 599.01 ml   Filed Weights   06/05/21  2108  Weight: 132.9 kg    Examination: Constitutional:  VS as above General Appearance: alert, well-developed, well-nourished, NAD Eyes: Normal lids and conjunctive, non-icteric sclera Ears, Nose, Mouth, Throat: Normal appearance Neck: No masses, trachea midline Respiratory: Normal respiratory effort Breath sounds normal, no wheeze/rhonchi/rales Cardiovascular: S1/S2 normal, no murmur/rub/gallop auscultated Pedal pulse II/IV bilaterally DP and PT No lower extremity edema Gastrointestinal: Nontender, no masses Musculoskeletal:  (+)effusion on L knee better compared to yesterday, normal R knee  No clubbing/cyanosis of digits Neurological: No cranial nerve deficit on limited exam Motor and sensation intact and symmetric Psychiatric: Normal judgment/insight Normal mood and affect       Scheduled Medications:   apixaban  5 mg Oral BID   asenapine  10 mg Sublingual Daily   atenolol  100 mg Oral Daily   atorvastatin  10 mg Oral Daily   cloZAPine  150 mg Oral QHS   colchicine  0.6 mg Oral BID   divalproex  2,000 mg Oral QHS   insulin aspart  0-20 Units Subcutaneous TID WC   insulin aspart  0-5 Units Subcutaneous QHS   levothyroxine  75 mcg Oral Q0600   oxybutynin  5 mg Oral Daily   pantoprazole  40 mg Oral Daily    Continuous Infusions:    PRN Medications:  acetaminophen **OR** acetaminophen, albuterol, ketorolac, ondansetron **OR** ondansetron (ZOFRAN) IV  Antimicrobials:  Anti-infectives (From admission, onward)    Start     Dose/Rate Route Frequency Ordered Stop   06/07/21 1400  vancomycin (VANCOREADY) IVPB 2000 mg/400 mL  Status:  Discontinued        2,000 mg 200 mL/hr over 120 Minutes Intravenous Every 12 hours 06/07/21 1002 06/08/21 0922   06/06/21 2300  cefTRIAXone (ROCEPHIN) 1 g in sodium chloride 0.9 % 100 mL IVPB  Status:  Discontinued        1 g 200 mL/hr over 30 Minutes Intravenous Every 24 hours 06/06/21 0520 06/06/21 0725   06/06/21 1600   vancomycin (VANCOREADY) IVPB 1250 mg/250 mL  Status:  Discontinued        1,250 mg 166.7 mL/hr over 90 Minutes Intravenous Every 12 hours 06/06/21 0635 06/07/21 1002   06/06/21 0200  vancomycin (VANCOCIN) IVPB 1000 mg/200 mL premix       See Hyperspace for full Linked Orders Report.   1,000 mg 200 mL/hr over 60 Minutes Intravenous  Once 06/06/21 0153 06/06/21 0450   06/06/21 0200  vancomycin (VANCOREADY) IVPB 1500 mg/300 mL       See Hyperspace for full Linked Orders Report.   1,500 mg 150 mL/hr over 120 Minutes Intravenous  Once 06/06/21 0153 06/06/21 0907   06/05/21 2215  cefTRIAXone (ROCEPHIN) 2 g in sodium chloride 0.9 % 100 mL IVPB  Status:  Discontinued        2 g 200 mL/hr over 30 Minutes Intravenous Every 24 hours 06/05/21 2200 06/08/21 1610       Data Reviewed: I have personally reviewed following labs and imaging studies  CBC: Recent Labs  Lab 06/05/21 2120 06/06/21 0121 06/07/21 0550 06/08/21 0605  WBC 9.0 9.1 5.9 6.0  NEUTROABS  --  5.5  --   --   HGB 11.8* 10.2* 10.4* 10.8*  HCT 38.6* 34.0* 34.4* 35.8*  MCV 84.6 86.5 85.1 86.3  PLT 196 168 175 960   Basic Metabolic Panel: Recent Labs  Lab 06/05/21 2120 06/05/21 2309 06/07/21 0550 06/08/21 0605  NA 133*  --  137 137  K 4.0  --  4.1 4.4  CL 98  --  102 103  CO2 26  --  28 28  GLUCOSE 158*  --  146* 149*  BUN 22*  --  11 12  CREATININE 1.28*  --  0.62 0.67  CALCIUM 8.8*  --  8.5* 8.5*  MG  --  1.6* 2.0  --    GFR: Estimated Creatinine Clearance: 170.8 mL/min (by C-G formula based on SCr of 0.67 mg/dL). Liver Function Tests: Recent Labs  Lab 06/05/21 2120  AST 13*  ALT 9  ALKPHOS 42  BILITOT 0.6  PROT 8.3*  ALBUMIN 3.3*   Recent Labs  Lab 06/05/21 2120  LIPASE 24   No results for input(s): "AMMONIA" in the last 168 hours. Coagulation Profile: Recent Labs  Lab 06/06/21 0625  INR 1.3*   Cardiac Enzymes: Recent Labs  Lab 06/05/21 2309  CKTOTAL 129   BNP (last 3 results) No  results for input(s): "PROBNP" in the last 8760 hours. HbA1C: Recent Labs    06/06/21 0121  HGBA1C 6.5*   CBG: Recent Labs  Lab 06/07/21 1719 06/07/21 2120 06/08/21 0725 06/08/21 1207 06/08/21 1535  GLUCAP 235* 161* 124*  170* 146*   Lipid Profile: No results for input(s): "CHOL", "HDL", "LDLCALC", "TRIG", "CHOLHDL", "LDLDIRECT" in the last 72 hours. Thyroid Function Tests: Recent Labs    06/05/21 2120 06/05/21 2309  TSH 5.613*  --   FREET4  --  1.05   Anemia Panel: No results for input(s): "VITAMINB12", "FOLATE", "FERRITIN", "TIBC", "IRON", "RETICCTPCT" in the last 72 hours. Urine analysis:    Component Value Date/Time   COLORURINE YELLOW (A) 06/06/2021 0121   APPEARANCEUR CLEAR (A) 06/06/2021 0121   APPEARANCEUR Clear 04/09/2014 1215   LABSPEC 1.009 06/06/2021 0121   LABSPEC 1.015 04/09/2014 1215   PHURINE 5.0 06/06/2021 0121   GLUCOSEU NEGATIVE 06/06/2021 0121   GLUCOSEU Negative 04/09/2014 1215   HGBUR NEGATIVE 06/06/2021 0121   BILIRUBINUR NEGATIVE 06/06/2021 0121   BILIRUBINUR Negative 04/09/2014 1215   KETONESUR NEGATIVE 06/06/2021 0121   PROTEINUR NEGATIVE 06/06/2021 0121   UROBILINOGEN 1.0 10/23/2011 1845   NITRITE NEGATIVE 06/06/2021 0121   LEUKOCYTESUR NEGATIVE 06/06/2021 0121   LEUKOCYTESUR Negative 04/09/2014 1215   Sepsis Labs: '@LABRCNTIP'$ (procalcitonin:4,lacticidven:4)  Recent Results (from the past 240 hour(s))  Blood Culture (routine x 2)     Status: None (Preliminary result)   Collection Time: 06/05/21 10:38 PM   Specimen: BLOOD  Result Value Ref Range Status   Specimen Description BLOOD RIGHT HAND  Final   Special Requests   Final    BOTTLES DRAWN AEROBIC AND ANAEROBIC Blood Culture adequate volume   Culture   Final    NO GROWTH 3 DAYS Performed at [facility], [address] [place], [place] [address]    Report Status PENDING  Incomplete  Blood Culture (routine x 2)     Status: None (Preliminary result)   Collection  Time: 06/05/21 10:45 PM   Specimen: BLOOD  Result Value Ref Range Status   Specimen Description BLOOD RIGHT ASSIST CONTROL  Final   Special Requests   Final    BOTTLES DRAWN AEROBIC AND ANAEROBIC Blood Culture adequate volume   Culture   Final    NO GROWTH 3 DAYS Performed at [facility], [address] [place], [place] [address]    Report Status PENDING  Incomplete  SARS Coronavirus 2 by RT PCR (hospital order, performed in [place] hospital lab) *cepheid single result test* Anterior Nasal Swab     Status: None   Collection Time: 06/05/21 11:38 PM   Specimen: Anterior Nasal Swab  Result Value Ref Range Status   SARS Coronavirus 2 by RT PCR NEGATIVE NEGATIVE Final    Comment: (NOTE) SARS-CoV-2 target nucleic acids are NOT DETECTED.  The SARS-CoV-2 RNA is generally detectable in upper and lower respiratory specimens during the acute phase of infection. The lowest concentration of SARS-CoV-2 viral copies this assay can detect is 250 copies / mL. A negative result does not preclude SARS-CoV-2 infection and should not be used as the sole basis for treatment or other patient management decisions.  A negative result may occur with improper specimen collection / handling, submission of specimen other than nasopharyngeal swab, presence of viral mutation(s) within the areas targeted by this assay, and inadequate number of viral copies (<250 copies / mL). A negative result must be combined with clinical observations, patient history, and epidemiological information.  Fact Sheet for Patients:   https://www.patel.info/  Fact Sheet for Healthcare Providers: https://hall.com/  This test is not yet approved or  cleared by the [place] FDA and has been authorized for detection and/or diagnosis of SARS-CoV-2 by FDA  under an Emergency Use Authorization (EUA).  This EUA will remain in effect (meaning this test can be used) for the  duration of the COVID-19 declaration under Section 564(b)(1) of the Act, 21 U.S.C. section 360bbb-3(b)(1), unless the authorization is terminated or revoked sooner.  Performed at [facility], [address] [place], [place] [address]   Urine Culture     Status: None   Collection Time: 06/06/21  1:21 AM   Specimen: In/Out Cath Urine  Result Value Ref Range Status   Specimen Description   Final    IN/OUT CATH URINE Performed at [facility], [address] [place], [place] [address]    Special Requests   Final    NONE Performed at [facility], [address] [place], [place] [address]    Culture   Final    NO GROWTH Performed at [facility], [place] [address], [place], [place] [address]    Report Status 06/07/2021 FINAL  Final  Body fluid culture w Gram Stain     Status: None (Preliminary result)   Collection Time: 06/06/21  1:21 AM   Specimen: KNEE; Body Fluid  Result Value Ref Range Status   Specimen Description   Final    KNEE LEFT Performed at [facility], [address] [place], [place] [address]    Special Requests   Final    NONE Performed at [facility], [address] [place], [place] [address]    Gram Stain   Final    NO SQUAMOUS EPITHELIAL CELLS SEEN ABUNDANT WBC PRESENT, PREDOMINANTLY PMN NO ORGANISMS SEEN    Culture   Final    NO GROWTH 2 DAYS Performed at [facility], [place] [address], [place], [place] [address]    Report Status PENDING  Incomplete         Radiology Studies last 96 hours: CT Angio Chest Pulmonary Embolism (PE) W or WO Contrast  Result Date: 06/08/2021 CLINICAL DATA:  PE suspected, shortness of breath EXAM: CT ANGIOGRAPHY CHEST WITH CONTRAST TECHNIQUE: Multidetector CT imaging of the chest was performed using the standard protocol during bolus administration of intravenous contrast. Multiplanar CT image reconstructions and MIPs were obtained to evaluate the vascular  anatomy. RADIATION DOSE REDUCTION: This exam was performed according to the departmental dose-optimization program which includes automated exposure control, adjustment of the mA and/or kV according to patient size and/or use of iterative reconstruction technique. CONTRAST:  22m OMNIPAQUE IOHEXOL 350 MG/ML SOLN COMPARISON:  11/30/2019 CTA chest FINDINGS: Cardiovascular: Satisfactory opacification of the pulmonary arteries to the segmental level. No evidence of pulmonary embolism. The main pulmonary artery is mildly enlarged. Three-vessel arch with a common origin of the right and left common carotid arteries, and an aberrant origin of the right subclavian artery. Normal heart size. No pericardial effusion. Mediastinum/Nodes: Redemonstrated nodular soft tissue within the anterior mediastinum, unchanged compared to 11/30/2019, possibly residual thymic tissue. Imaged portion of the thyroid is unremarkable. No axillary, mediastinal, or hilar lymphadenopathy. Fluid in the esophagus, which increases the risk of aspiration. Small hiatal hernia. The trachea is unremarkable. Lungs/Pleura: Small right and trace left pleural effusions with associated atelectasis. No additional focal pulmonary opacity. No pneumothorax. Upper Abdomen: No acute finding. Musculoskeletal: No acute osseous abnormality. Review of the MIP images confirms the above findings. IMPRESSION: 1. Negative for pulmonary embolism. Enlargement of the main pulmonary artery, which can be seen in the setting of pulmonary hypertension. 2. Small right and trace left pleural  effusions. 3. Fluid in the esophagus, which increases the risk of aspiration. Electronically Signed   By: Merilyn Baba M.D.   On: 06/08/2021 13:14   CT HEAD WO CONTRAST (5MM)  Result Date: 06/06/2021 CLINICAL DATA:  Mental status change, unknown cause. Weakness and left arm pain. EXAM: CT HEAD WITHOUT CONTRAST TECHNIQUE: Contiguous axial images were obtained from the base of the skull through  the vertex without intravenous contrast. RADIATION DOSE REDUCTION: This exam was performed according to the departmental dose-optimization program which includes automated exposure control, adjustment of the mA and/or kV according to patient size and/or use of iterative reconstruction technique. COMPARISON:  Head CT 12/22/2019 FINDINGS: Brain: There is no evidence of an acute infarct, intracranial hemorrhage, mass, midline shift, or extra-axial fluid collection. The ventricles and sulci are borderline prominent for age which may reflect early cerebral atrophy. Vascular: No hyperdense vessel. Skull: No fracture or suspicious osseous lesion. Sinuses/Orbits: Trace fluid in the maxillary sinuses. Clear mastoid air cells. Unremarkable orbits. Other: None. IMPRESSION: No evidence of acute intracranial abnormality. Electronically Signed   By: Logan Bores M.D.   On: 06/06/2021 11:48   US Venous Img Upper Uni Left  Result Date: 06/06/2021 CLINICAL DATA:  History of left arm pain for 3 days EXAM: LEFT UPPER EXTREMITY VENOUS DOPPLER ULTRASOUND TECHNIQUE: Gray-scale sonography with graded compression, as well as color Doppler and duplex ultrasound were performed to evaluate the upper extremity deep venous system from the level of the subclavian vein and including the jugular, axillary, basilic, radial, ulnar and upper cephalic vein. Spectral Doppler was utilized to evaluate flow at rest and with distal augmentation maneuvers. COMPARISON:  None Available. FINDINGS: Contralateral Subclavian Vein: Respiratory phasicity is normal and symmetric with the symptomatic side. No evidence of thrombus. Normal compressibility. Internal Jugular Vein: No evidence of thrombus. Normal compressibility, respiratory phasicity and response to augmentation. Subclavian Vein: No evidence of thrombus. Normal compressibility, respiratory phasicity and response to augmentation. Axillary Vein: No evidence of thrombus. Normal compressibility,  respiratory phasicity and response to augmentation. Cephalic Vein: Short segment of nonocclusive thrombus is noted just above the antecubital fossa. Decreased compressibility is seen. Basilic Vein: No evidence of thrombus. Normal compressibility, respiratory phasicity and response to augmentation. Brachial Veins: No evidence of thrombus. Normal compressibility, respiratory phasicity and response to augmentation. Radial Veins: No evidence of thrombus. Normal compressibility, respiratory phasicity and response to augmentation. Ulnar Veins: No evidence of thrombus. Normal compressibility, respiratory phasicity and response to augmentation. Venous Reflux:  None visualized. Other Findings:  None visualized. IMPRESSION: Short segment of nonocclusive thrombus within cephalic vein near the elbow joint. Electronically Signed   By: Inez Catalina M.D.   On: 06/06/2021 03:08   CT Elbow Left Wo Contrast  Result Date: 06/06/2021 CLINICAL DATA:  Possible radius fracture EXAM: CT OF THE UPPER LEFT EXTREMITY WITHOUT CONTRAST TECHNIQUE: Multidetector CT imaging of the upper left extremity was performed according to the standard protocol. RADIATION DOSE REDUCTION: This exam was performed according to the departmental dose-optimization program which includes automated exposure control, adjustment of the mA and/or kV according to patient size and/or use of iterative reconstruction technique. COMPARISON:  None Available. FINDINGS: Bones/Joint/Cartilage No fracture or dislocation. Ligaments Suboptimally assessed by CT. Muscles and Tendons Unremarkable Soft tissues Small hematoma superficial to the olecranon process. IMPRESSION: 1. No fracture or dislocation. 2. Small hematoma superficial to the olecranon process. Electronically Signed   By: Ulyses Jarred M.D.   On: 06/06/2021 02:39   DG Wrist Complete Left  Result  Date: 06/06/2021 CLINICAL DATA:  Atraumatic left arm pain. EXAM: LEFT WRIST - COMPLETE 3+ VIEW COMPARISON:  None  Available. FINDINGS: There is no evidence of an acute fracture or dislocation. Two radiopaque surgical clips are seen within the fourth left metacarpal. There is no evidence of arthropathy or other focal bone abnormality. Soft tissues are unremarkable. IMPRESSION: No acute osseous abnormality. Electronically Signed   By: Virgina Norfolk M.D.   On: 06/06/2021 00:59   DG Elbow Complete Left  Result Date: 06/06/2021 CLINICAL DATA:  Atraumatic left arm pain. EXAM: LEFT ELBOW - COMPLETE 3+ VIEW COMPARISON:  None Available. FINDINGS: A very small area of cortical irregularity is seen along the volar aspect of the left radial head. This is of indeterminate age. There is no evidence of dislocation. A moderate to large bony spur is seen along the olecranon process of the proximal left ulna. Mild soft tissue swelling is seen along the dorsal aspect of the olecranon process. IMPRESSION: 1. Findings which may represent a very small left radial head fracture, of indeterminate age. Correlation with physical examination is recommended to determine the presence of point tenderness. 2. Degenerative changes with mild dorsal soft tissue swelling. Electronically Signed   By: Virgina Norfolk M.D.   On: 06/06/2021 00:58   DG Shoulder Left  Result Date: 06/06/2021 CLINICAL DATA:  Atraumatic left arm pain. EXAM: LEFT SHOULDER - 2+ VIEW COMPARISON:  None Available. FINDINGS: There is no evidence of fracture or dislocation. There is no evidence of arthropathy or other focal bone abnormality. Soft tissues are unremarkable. IMPRESSION: Negative. Electronically Signed   By: Virgina Norfolk M.D.   On: 06/06/2021 00:54   DG Knee Complete 4 Views Left  Result Date: 06/05/2021 CLINICAL DATA:  Left knee pain. EXAM: LEFT KNEE - COMPLETE 4+ VIEW COMPARISON:  08/09/2019 FINDINGS: No fracture or dislocation. There is mild medial tibiofemoral joint space narrowing. Minimal peripheral spurring of the patellofemoral medial tibiofemoral  compartment as well as tibial spines. Small quadriceps and patellar tendon enthesophytes. Minimal knee joint effusion. No erosion or bone destruction. IMPRESSION: Mild osteoarthritis and minimal joint effusion. No acute osseous abnormality. Electronically Signed   By: Keith Rake M.D.   On: 06/05/2021 22:38   DG Chest Port 1 View  Result Date: 06/05/2021 CLINICAL DATA:  Weakness.  Left arm pain.  Fever. EXAM: PORTABLE CHEST 1 VIEW COMPARISON:  09/01/2020 FINDINGS: Low lung volumes are present, causing crowding of the pulmonary vasculature. Upper normal heart size. The lungs appear clear. No blunting of the costophrenic angles. IMPRESSION: 1.  No active cardiopulmonary disease is radiographically apparent. 2. Low lung volumes. Electronically Signed   By: Van Clines M.D.   On: 06/05/2021 21:45            LOS: 2 days    Time spent: 35 min    Emeterio Reeve, DO [facility] 06/08/2021, 5:14 PM   Staff may message me via secure chat in [place]  but this may not receive immediate response,  please page for urgent matters!  If 7PM-7AM, please contact night-coverage [email or web address]  Dictation software was used to generate the above note. Typos may occur and escape review, as with typed/written notes. Please contact Dr Sheppard Coil directly for clarity if needed.

## 2021-06-08 NOTE — Progress Notes (Signed)
   06/08/21 0415  Assess: MEWS Score  Temp 98.5 F (36.9 C)  BP (!) 138/102  MAP (mmHg) 114  Pulse Rate (!) 117  Resp 16  SpO2 95 %  O2 Device Room Air  Assess: MEWS Score  MEWS Temp 0  MEWS Systolic 0  MEWS Pulse 2  MEWS RR 0  MEWS LOC 0  MEWS Score 2  MEWS Score Color Yellow  Treat  Pain Scale 0-10  Pain Score 0  Escalate  MEWS: Escalate Yellow: discuss with charge nurse/RN and consider discussing with provider and RRT  Notify: Charge Nurse/RN  Name of Charge Nurse/RN Notified Kennyth Lose  Date Charge Nurse/RN Notified 06/08/21  Time Charge Nurse/RN Notified 0430  Notify: Provider  Provider Name/Title Hassan Rowan NP  Date Provider Notified 06/08/21  Time Provider Notified [phone]  Method of Notification Page (Text)  Notification Reason Change in status  Provider response No new orders  Date of Provider Response 06/08/21  Time of Provider Response 0507  Assess: SIRS CRITERIA  SIRS Temperature  0  SIRS Pulse 1  SIRS Respirations  0  SIRS WBC 0  SIRS Score Sum  1

## 2021-06-09 LAB — BASIC METABOLIC PANEL
Anion gap: 6 (ref 5–15)
BUN: 19 mg/dL (ref 6–20)
CO2: 28 mmol/L (ref 22–32)
Calcium: 8.6 mg/dL — ABNORMAL LOW (ref 8.9–10.3)
Chloride: 103 mmol/L (ref 98–111)
Creatinine, Ser: 0.78 mg/dL (ref 0.61–1.24)
GFR, Estimated: >60 mL/min (ref >60)
Glucose, Bld: 135 mg/dL — ABNORMAL HIGH (ref 70–99)
Potassium: 4.4 mmol/L (ref 3.5–5.1)
Sodium: 137 mmol/L (ref 135–145)

## 2021-06-09 LAB — GLUCOSE, CAPILLARY
Glucose-Capillary: 150 mg/dL — ABNORMAL HIGH (ref 70–99)
Glucose-Capillary: 203 mg/dL — ABNORMAL HIGH (ref 70–99)
Glucose-Capillary: 177 mg/dL — ABNORMAL HIGH (ref 70–99)
Glucose-Capillary: 222 mg/dL — ABNORMAL HIGH (ref 70–99)

## 2021-06-09 LAB — CBC
HCT: 34.8 % — ABNORMAL LOW (ref 39.0–52.0)
Hemoglobin: 10.5 g/dL — ABNORMAL LOW (ref 13.0–17.0)
MCH: 25.8 pg — ABNORMAL LOW (ref 26.0–34.0)
MCHC: 30.2 g/dL (ref 30.0–36.0)
MCV: 85.5 fL (ref 80.0–100.0)
Platelets: 246 10*3/uL (ref 150–400)
RBC: 4.07 MIL/uL — ABNORMAL LOW (ref 4.22–5.81)
RDW: 14.3 % (ref 11.5–15.5)
WBC: 7 10*3/uL (ref 4.0–10.5)
nRBC: 0 % (ref 0.0–0.2)

## 2021-06-09 LAB — BODY FLUID CULTURE W GRAM STAIN
Culture: NO GROWTH
Gram Stain: NONE SEEN

## 2021-06-09 MED ORDER — BENZONATATE 100 MG PO CAPS
200.0000 mg | ORAL_CAPSULE | Freq: Three times a day (TID) | ORAL | Status: DC | PRN
  Administered 2021-06-09: 200 mg via ORAL
  Filled 2021-06-09: qty 2

## 2021-06-09 NOTE — Evaluation (Signed)
Physical Therapy Evaluation Patient Details Name: John [name]. MRN: [id number] DOB: [birth year] Today's Date: 06/09/2021  History of Present Illness  46 y.o. male with medical history significant for Schizophrenia, OSA noncompliant with BiPAP, hypertension, asthma, gout bilateral knees, hospitalized in August 2022 with sepsis secondary to suspected right septic knee joint but with negative synovial fluid cultures who presents to the ED from his group home with a complaint of left knee pain as well as left arm pain without any reports of injury.  Noted with non-occlusive DVT to L UE (on anticoag) and gout with SIRS to L knee.  Clinical Impression  Patient seated edge of bed with OT upon arrival to room; alert and oriented to basic information, follows simple commands, agreeable to participation with session.  Endorses mild pain in L knee (FACES 2/10); improved since admission.  Generally weak and deconditioned throughout bilat LEs, but no focal weakness appreciated.  Able to complete bed mobility with min assist; sit/stand with RW, mod assist; basic transfers and gait (180') with RW, min assist.   Does require step-by-step cuing for transfer mechanics; extensive physical assist for lift off, anterior weight translation with all sit/stand attempts.  Anticipate improvement in performance with continued repetition of task. Would benefit from skilled PT to address above deficits and promote optimal return to PLOF.; recommend transition to STR upon discharge from acute hospitalization. HOWEVER, if group home able to provide level of assist patient patient currently requiring, patient okay for return to group home with PT/OT follow up.          Recommendations for follow up therapy are one component of a multi-disciplinary discharge planning process, led by the attending physician.  Recommendations may be updated based on patient status, additional functional criteria and insurance  authorization.  Follow Up Recommendations Skilled nursing-short term rehab (<3 hours/day)    Assistance Recommended at Discharge Frequent or constant Supervision/Assistance  Patient can return home with the following  A lot of help with walking and/or transfers;A lot of help with bathing/dressing/bathroom    Equipment Recommendations Rolling walker (2 wheels)  Recommendations for Other Services       Functional Status Assessment Patient has had a recent decline in their functional status and demonstrates the ability to make significant improvements in function in a reasonable and predictable amount of time.     Precautions / Restrictions Precautions Precautions: Fall Restrictions Weight Bearing Restrictions: No      Mobility  Bed Mobility Overal bed mobility: Needs Assistance Bed Mobility: Supine to Sit     Supine to sit: Min assist     General bed mobility comments: assistance for L LE to EOB and cuing for hand placement and technique    Transfers Overall transfer level: Needs assistance Equipment used: 2 person hand held assist, Rolling walker (2 wheels) Transfers: Sit to/from Stand Sit to Stand: Mod assist, +2 physical assistance           General transfer comment: from standard bed surface; step-by-step cuing for transfer mechanics; extensive physical assist for lift off, anterior weight translation    Ambulation/Gait Ambulation/Gait assistance: Min assist, +2 safety/equipment Gait Distance (Feet): 180 Feet Assistive device: Rolling walker (2 wheels)         General Gait Details: reciprocal stepping pattern with fair step height/length, slightly shufling pattern; limited trunk rotation; limited balance reactions.  Does require continued use of RW to optimize balance and overall safety  Stairs  Wheelchair Mobility    Modified Rankin (Stroke Patients Only)       Balance Overall balance assessment: Needs assistance Sitting-balance  support: No upper extremity supported, Feet supported Sitting balance-Leahy Scale: Good     Standing balance support: Bilateral upper extremity supported Standing balance-Leahy Scale: Poor                               Pertinent Vitals/Pain Pain Assessment Pain Assessment: Faces Faces Pain Scale: Hurts little more Pain Location: L knee Pain Descriptors / Indicators: Discomfort Pain Intervention(s): Limited activity within patient's tolerance, Monitored during session, Repositioned    Home Living Family/patient expects to be discharged to:: Group home                   Additional Comments: Reports facility is single-level with no steps required to enter/exit    Prior Function Prior Level of Function : Independent/Modified Independent             Mobility Comments: Pt reports independent ambulation without use of AD at baseline ADLs Comments: Pt reports showering and dressing independently. Staff assist with medication, meals, and laundry. He helps with trash.     Hand Dominance   Dominant Hand: Right    Extremity/Trunk Assessment   Upper Extremity Assessment Upper Extremity Assessment: Overall WFL for tasks assessed    Lower Extremity Assessment Lower Extremity Assessment: Generalized weakness (grossly 4-/5 throughout)       Communication   Communication: No difficulties  Cognition Arousal/Alertness: Awake/alert Behavior During Therapy: WFL for tasks assessed/performed Overall Cognitive Status: Within Functional Limits for tasks assessed                                          General Comments      Exercises Other Exercises Other Exercises: Sit/stand x3 with RW, mod assist-emphasis on mechanics, anterior weight translation and overall balance.  Extensive assist required from standard surfaces. Limited eccentric control with stand to sit. Other Exercises: Forward/backward stepping with RW, min assist with RW    Assessment/Plan    PT Assessment Patient needs continued PT services  PT Problem List Decreased strength;Decreased activity tolerance;Decreased balance;Decreased mobility;Decreased knowledge of use of DME;Decreased safety awareness;Decreased knowledge of precautions;Pain       PT Treatment Interventions DME instruction;Gait training;Stair training;Functional mobility training;Therapeutic activities;Therapeutic exercise;Balance training;Patient/family education    PT Goals (Current goals can be found in the Care Plan section)  Acute Rehab PT Goals Patient Stated Goal: to return to group home PT Goal Formulation: With patient Time For Goal Achievement: 06/23/21 Potential to Achieve Goals: Good    Frequency Min 2X/week     Co-evaluation PT/OT/SLP Co-Evaluation/Treatment: Yes Reason for Co-Treatment: Complexity of the patient's impairments (multi-system involvement);For patient/therapist safety;To address functional/ADL transfers PT goals addressed during session: Mobility/safety with mobility OT goals addressed during session: ADL's and self-care       AM-PAC PT "6 Clicks" Mobility  Outcome Measure Help needed turning from your back to your side while in a flat bed without using bedrails?: None Help needed moving from lying on your back to sitting on the side of a flat bed without using bedrails?: None Help needed moving to and from a bed to a chair (including a wheelchair)?: A Lot Help needed standing up from a chair using your arms (e.g., wheelchair or  bedside chair)?: A Lot Help needed to walk in hospital room?: A Little Help needed climbing 3-5 steps with a railing? : A Little 6 Click Score: 18    End of Session Equipment Utilized During Treatment: Gait belt Activity Tolerance: Patient tolerated treatment well Patient left: in chair;with call bell/phone within reach;with chair alarm set Nurse Communication: Mobility status PT Visit Diagnosis: Muscle weakness  (generalized) (M62.81);Difficulty in walking, not elsewhere classified (R26.2)    Time: 0931-1000 PT Time Calculation (min) (ACUTE ONLY): 29 min   Charges:   PT Evaluation $PT Eval Moderate Complexity: 1 Mod PT Treatments $Therapeutic Activity: 8-22 mins        Omar Orrego H. Owens Shark, PT, DPT, NCS 06/09/21, 1:12 PM [phone]

## 2021-06-09 NOTE — Care Management Important Message (Signed)
Important Message  Patient Details  Name: John [name]. MRN: [id number] Date of Birth: 1975-12-03   Medicare Important Message Given:  Yes     Juliann Pulse A Genesi Stefanko 06/09/2021, 12:43 PM

## 2021-06-09 NOTE — Progress Notes (Signed)
CPAP in room. Pt instructed to have RN call respiratory when he is ready to go to sleep so RRT can put CPAP mask on him.

## 2021-06-09 NOTE — TOC Progression Note (Signed)
Transition of Care (TOC) - Progression Note    Patient Details  Name: John [name]. MRN: [id number] Date of Birth: [birth year]  Transition of Care [facility]) CM/SW Contact  Pete Pelt, RN Phone Number: 06/09/2021, 4:11 PM  Clinical Narrative:   RNCM spoke with group home, who states they will be happy to take him back, however if he is +2 assist, then they are not able to accommodate his needs.  Group home states sometimes he has issues getting up due to gout, and that if he is in bed too long, he will not want to get up.  Patient needs continued motivation.  Patient does require +2 asssit, but is able to ambulate per PT.  Group home is in agreement at this point that SNF for strengthening in order to facilitate transfer to group home to live more independent is the best option for patient at this point.  SNF bed search started.    Expected Discharge Plan: Group Home Barriers to Discharge: Continued Medical Work up  Expected Discharge Plan and Services Expected Discharge Plan: Group Home       Living arrangements for the past 2 months: Group Home                                       Social Determinants of Health (SDOH) Interventions    Readmission Risk Interventions     No data to display

## 2021-06-09 NOTE — Evaluation (Signed)
Occupational Therapy Evaluation Patient Details Name: John [name]. MRN: [id number] DOB: 06-10-1975 Today's Date: 06/09/2021   History of Present Illness 46 y.o. male with medical history significant for Schizophrenia, OSA noncompliant with BiPAP, hypertension, asthma, gout bilateral knees, hospitalized in August 2022 with sepsis secondary to suspected right septic knee joint but with negative synovial fluid cultures who presents to the ED from his group home with a complaint of left knee pain as well as left arm pain without any reports of injury.   Clinical Impression   Patient presenting with decreased Ind in self care,balance, functional mobility/transfers, endurance, and safety awareness. Patient reports living in a group home but is independent in self care and functional mobility without use of AD at baseline. Pt is initially having difficulty remaining alert but once EOB is able to actively participate. Pt needing min A to EOB with assistance for L LE. Pt stands from standard height with mod A of 2 and use of RW. Once up, pt ambulates ~ 200' with RW and min guard - min A with chair follow for safety. Pt needing total A to don B socks. Patient will benefit from acute OT to increase overall independence in the areas of ADLs, functional mobility, and safety awareness in order to safely discharge to next venue of care.      Recommendations for follow up therapy are one component of a multi-disciplinary discharge planning process, led by the attending physician.  Recommendations may be updated based on patient status, additional functional criteria and insurance authorization.   Follow Up Recommendations  Skilled nursing-short term rehab (<3 hours/day)    Assistance Recommended at Discharge Frequent or constant Supervision/Assistance  Patient can return home with the following A lot of help with walking and/or transfers;A lot of help with bathing/dressing/bathroom;Assist for  transportation;Assistance with cooking/housework    Functional Status Assessment  Patient has had a recent decline in their functional status and demonstrates the ability to make significant improvements in function in a reasonable and predictable amount of time.  Equipment Recommendations  Other (comment) (defer to next venue of care)       Precautions / Restrictions Precautions Precautions: Fall Restrictions Weight Bearing Restrictions: No      Mobility Bed Mobility Overal bed mobility: Needs Assistance Bed Mobility: Supine to Sit     Supine to sit: Min assist     General bed mobility comments: assistance for L LE to EOB and cuing for hand placement and technique    Transfers Overall transfer level: Needs assistance Equipment used: 2 person hand held assist, Rolling walker (2 wheels) Transfers: Sit to/from Stand Sit to Stand: Min assist, Mod assist, +2 physical assistance           General transfer comment: from standard height surface      Balance Overall balance assessment: Needs assistance Sitting-balance support: Feet supported Sitting balance-Leahy Scale: Good     Standing balance support: Reliant on assistive device for balance, During functional activity Standing balance-Leahy Scale: Poor                             ADL either performed or assessed with clinical judgement   ADL Overall ADL's : Needs assistance/impaired                     Lower Body Dressing: Maximal assistance  General ADL Comments: simulated toilet transfer with pt needing +2 assistance to stand from lower surfaces but min guard for ambulation with RW once up.     Vision Patient Visual Report: No change from baseline              Pertinent Vitals/Pain Pain Assessment Pain Assessment: Faces Faces Pain Scale: Hurts a little bit Pain Location: L knee Pain Descriptors / Indicators: Discomfort Pain Intervention(s): Monitored during  session, Repositioned     Hand Dominance Right   Extremity/Trunk Assessment Upper Extremity Assessment Upper Extremity Assessment: Generalized weakness   Lower Extremity Assessment Lower Extremity Assessment: Generalized weakness       Communication Communication Communication: No difficulties   Cognition Arousal/Alertness: Awake/alert Behavior During Therapy: WFL for tasks assessed/performed Overall Cognitive Status: Within Functional Limits for tasks assessed                                                  Home Living Family/patient expects to be discharged to:: Group home                                        Prior Functioning/Environment Prior Level of Function : Needs assist             Mobility Comments: Pt reports independent ambulation without use of AD at baseline ADLs Comments: Pt reports showering and dressing independently. Staff assist with medication, meals, and laundry. He helps with trash.        OT Problem List: Decreased strength;Pain;Decreased activity tolerance;Decreased safety awareness;Impaired balance (sitting and/or standing);Decreased knowledge of use of DME or AE      OT Treatment/Interventions: Self-care/ADL training;Balance training;Therapeutic exercise;Therapeutic activities;Energy conservation;DME and/or AE instruction;Visual/perceptual remediation/compensation;Patient/family education    OT Goals(Current goals can be found in the care plan section) Acute Rehab OT Goals Patient Stated Goal: to get stronger OT Goal Formulation: With patient Time For Goal Achievement: 06/23/21 Potential to Achieve Goals: Good ADL Goals Pt Will Perform Grooming: with supervision;standing Pt Will Transfer to Toilet: ambulating;with min guard assist Pt Will Perform Toileting - Clothing Manipulation and hygiene: with min guard assist;sit to/from stand Pt Will Perform Tub/Shower Transfer: with min guard  assist;ambulating  OT Frequency: Min 2X/week    Co-evaluation PT/OT/SLP Co-Evaluation/Treatment: Yes Reason for Co-Treatment: Complexity of the patient's impairments (multi-system involvement);For patient/therapist safety;To address functional/ADL transfers   OT goals addressed during session: ADL's and self-care      AM-PAC OT "6 Clicks" Daily Activity     Outcome Measure Help from another person eating meals?: None Help from another person taking care of personal grooming?: A Little Help from another person toileting, which includes using toliet, bedpan, or urinal?: A Lot Help from another person bathing (including washing, rinsing, drying)?: A Lot Help from another person to put on and taking off regular upper body clothing?: A Little Help from another person to put on and taking off regular lower body clothing?: A Lot 6 Click Score: 16   End of Session Equipment Utilized During Treatment: Rolling walker (2 wheels) Nurse Communication: Mobility status  Activity Tolerance: Patient tolerated treatment well Patient left: with call bell/phone within reach;in chair;with chair alarm set  OT Visit Diagnosis: Unsteadiness on feet (R26.81);Muscle weakness (generalized) (M62.81)  Time: 0922-1000 OT Time Calculation (min): 38 min Charges:  OT General Charges $OT Visit: 1 Visit OT Evaluation $OT Eval Moderate Complexity: 1 Mod OT Treatments $Therapeutic Activity: 8-22 mins  Darleen Crocker, MS, OTR/L , CBIS ascom [phone]  06/09/21, 12:26 PM

## 2021-06-09 NOTE — Progress Notes (Signed)
PROGRESS NOTE    John [name].  QMG:[id number] DOB: 09-Apr-1975  DOA: 06/05/2021 Date of Service: 06/09/21 PCP: Cletis Athens, MD     Brief Narrative / Hospital Course:  John [name]. is a 46 y.o. male with medical history significant for Schizophrenia, OSA noncompliant with BiPAP, hypertension, asthma, gout bilateral knees, hospitalized in August 2022 with sepsis secondary to suspected right septic knee joint but with negative synovial fluid cultures who presents to the ED from his group home with a complaint of left knee pain as well as left arm pain without any reports of injury. ED course and data review: Tmax 101.4 with pulse 117, respirations 22-26.  Labs with WBC of 9 and lactate 2.1.  Hemoglobin 11.8.  Creatinine 1.28 above baseline of 0.6.  Synovial cell count shows intracellular monosodium urate crystals, 73,000 WBCs 96% neutrophils. Chest x-ray, left knee, shoulder, elbow, wrist XR's as well as CT left elbow and left upper extremity venous Doppler.John KitchenLUE Venous doppler: Short segment of nonocclusive thrombus within cephalic vein near the elbow joint Left elbow TO:IZTIW hematoma superficial to the olecranon process Bilateral knee DG: Mild osteoarthritis and minimal joint effusion. No acute osseous abnormality. Patient was started on an IV fluid bolus per sepsis protocol and started on ceftriaxone and vancomycin for septic arthritis. 06/06: intracellular monosodium urate crystals on synovial cell count c/w gout, pending protein and Glc. Had episode of lowered responsiveness, pinpoint pupils but episode resolved and pt back to baseline, CT head WNL.  06/07: blood, urine cx neg x2 days, synovial cs neg x1 day. D?c heparin gtt and transitioned to Eliquis. Plan to d/c abx if cultures and AM labs show no concerns.   06/08: L knee synovial cultures negative and effusion less on physical exam. IV abx d/c. Remains asymptomatic tachycardia about same as on arrival to ED, records reviewed and he  is not tachy at baseline per PCP notes. CTA chest  done and negative for PE, it did show enlarged pulmonary artery concerning for pulmonary HTN, also fluid in esophagus risk for aspiration but lungs otherwise appear WNL.  06/09: HR WNL and no concerns on labs, anemia stable no s/s bleeding, d/c tele. Now ambulatory dysfunction and group home declining to take him back until/unless he is more ambulatory - today is Friday, unable to arrange SNF today, will work w/ PT over the weekend and hopefully can revisit group home on Monday vs SNF if not improving   Consultants:  none  Procedures: Synovial fluid drainage L knee in ED     Subjective: Patient reports feeling well today, still some lower extremity / foot pain.       ASSESSMENT & PLAN:   Principal Problem:   SIRS of non-infectious origin w acute organ dysfunction ([name]) Active Problems:   Sepsis, possible([name])   AKI (acute kidney injury) ([place])   Gout   Acute deep vein thrombosis (DVT) of left upper extremity ([place])   Essential hypertension   Hypothyroidism   Paranoid schizophrenia, chronic condition (John [name])   Diabetes mellitus (John [name])   OSA on CPAP   SIRS (systemic inflammatory response syndrome) ([name])   AKI (acute kidney injury) (John [name]) Resolved IV hydration --> po monitor renal function  Suspect septic arthritis of knee, left ([name]) Acute gout with SIRS Continue Rocephin and vancomycin Follow-up synovial fluid with culture Consider Ortho consult in the a.m. Pain control  SIRS of non-infectious origin w acute organ dysfunction ([name]) Acute Gout SIRS resolved as of 06/09/21  Synovial fluid L  knee (+)intracellular monosodium urate crystals c/w gout Synovial fluid culture negative x2 days  Colchicine twice daily to continue outpatient and starting allopurinol as well  Holding off on prednisone for now given improvement in knee exam  Consider Ortho follow-up outpatient vs consult here if worsening synovial fluid on knee  needing drained Pain control  Acute deep vein thrombosis (DVT) of left upper extremity ([name]) Short segment of nonocclusive thrombus within cephalic vein near the elbow joint where there is a Small hematoma superficial to the olecranon process, probably from an injury initial anticoagulation for DVT --> heparin gtt D/c heparin 06/07 and transition to po anticoagulation w/ Eliquis, pharmacy has confirmed coverage for Rx  Essential hypertension Held atenolol initially d/t soft BP and restarted losartan hoping may have some benfit in gout prevention, but restarted beta blocker given tachycardia  Hypothyroidism TSH was elevated.  Follow-up free T4 was WNL Continue levothyroxine but increased dose 75 to 88 mcg daily  Follow outpatient  Paranoid schizophrenia, chronic condition ([name]) Continue Depakote and Clozaril  Diabetes mellitus ([place]) Sliding scale insulin coverage inpatient --> Resume home medications  OSA on CPAP Pulmonary HTN likely based on CT chest!  Continue CPAP - pt has hx noncompliance with this here and outpatient, he has been counseled on importance of adherence to this   Sepsis, possible([name]) Ruled out Patient with sepsis criteria on admission but without definite source of infection Repeat lactic improved Procalcitonin negative Cultured negative Had been on vanc/rocephin --> d/c abx w/o adverse effect   Gout See above Will start allopurinol + continue for up to 6 mos (can stop at 3 mos if uric acid levels WNL)     DVT prophylaxis: on Eliquis to contineu on d/c for DVT Code Status: FULL Family Communication: none at this time Disposition Plan / TOC needs: back to group home vs SNF Barriers to discharge / significant pending items: ambulatory dysfunction and placement is now an issue              Objective: Vitals:   06/08/21 2101 06/09/21 0000 06/09/21 0446 06/09/21 0727  BP: 138/86 104/71 102/70 115/83  Pulse: (!) 113 96 95 99  Resp: '20 20 18  16  '$ Temp: 98.4 F (36.9 C) 98.9 F (37.2 C) 98.2 F (36.8 C) 99.2 F (37.3 C)  TempSrc:    Oral  SpO2: 95% 93% 93% 93%  Weight:      Height:        Intake/Output Summary (Last 24 hours) at 06/09/2021 1615 Last data filed at 06/09/2021 0900 Gross per 24 hour  Intake 240 ml  Output 925 ml  Net -685 ml   Filed Weights   06/05/21 2108  Weight: 132.9 kg    Examination: Constitutional:  VS as above General Appearance: alert, well-developed, well-nourished, NAD Eyes: Normal lids and conjunctive, non-icteric sclera Ears, Nose, Mouth, Throat: Normal appearance Neck: No masses, trachea midline Respiratory: Normal respiratory effort Breath sounds normal, no wheeze/rhonchi/rales Cardiovascular: S1/S2 normal, no murmur/rub/gallop auscultated No lower extremity edema Gastrointestinal: Nontender, no masses Musculoskeletal:  (+)effusion on L knee trace compared to previous  No clubbing/cyanosis of digits Neurological: No cranial nerve deficit on limited exam Motor and sensation intact and symmetric Psychiatric: Normal judgment/insight Normal mood and affect       Scheduled Medications:   apixaban  5 mg Oral BID   asenapine  10 mg Sublingual Daily   atenolol  100 mg Oral Daily   atorvastatin  10 mg Oral Daily   cloZAPine  150 mg Oral QHS   colchicine  0.6 mg Oral BID   divalproex  2,000 mg Oral QHS   insulin aspart  0-20 Units Subcutaneous TID WC   insulin aspart  0-5 Units Subcutaneous QHS   levothyroxine  75 mcg Oral Q0600   oxybutynin  5 mg Oral Daily   pantoprazole  40 mg Oral Daily    Continuous Infusions:    PRN Medications:  acetaminophen **OR** acetaminophen, albuterol, benzonatate, ketorolac, ondansetron **OR** ondansetron (ZOFRAN) IV  Antimicrobials:  Anti-infectives (From admission, onward)    Start     Dose/Rate Route Frequency Ordered Stop   06/07/21 1400  vancomycin (VANCOREADY) IVPB 2000 mg/400 mL  Status:  Discontinued        2,000  mg 200 mL/hr over 120 Minutes Intravenous Every 12 hours 06/07/21 1002 06/08/21 0922   06/06/21 2300  cefTRIAXone (ROCEPHIN) 1 g in sodium chloride 0.9 % 100 mL IVPB  Status:  Discontinued        1 g 200 mL/hr over 30 Minutes Intravenous Every 24 hours 06/06/21 0520 06/06/21 0725   06/06/21 1600  vancomycin (VANCOREADY) IVPB 1250 mg/250 mL  Status:  Discontinued        1,250 mg 166.7 mL/hr over 90 Minutes Intravenous Every 12 hours 06/06/21 0635 06/07/21 1002   06/06/21 0200  vancomycin (VANCOCIN) IVPB 1000 mg/200 mL premix       See Hyperspace for full Linked Orders Report.   1,000 mg 200 mL/hr over 60 Minutes Intravenous  Once 06/06/21 0153 06/06/21 0450   06/06/21 0200  vancomycin (VANCOREADY) IVPB 1500 mg/300 mL       See Hyperspace for full Linked Orders Report.   1,500 mg 150 mL/hr over 120 Minutes Intravenous  Once 06/06/21 0153 06/06/21 0907   06/05/21 2215  cefTRIAXone (ROCEPHIN) 2 g in sodium chloride 0.9 % 100 mL IVPB  Status:  Discontinued        2 g 200 mL/hr over 30 Minutes Intravenous Every 24 hours 06/05/21 2200 06/08/21 9735       Data Reviewed: I have personally reviewed following labs and imaging studies  CBC: Recent Labs  Lab 06/05/21 2120 06/06/21 0121 06/07/21 0550 06/08/21 0605 06/09/21 0608  WBC 9.0 9.1 5.9 6.0 7.0  NEUTROABS  --  5.5  --   --   --   HGB 11.8* 10.2* 10.4* 10.8* 10.5*  HCT 38.6* 34.0* 34.4* 35.8* 34.8*  MCV 84.6 86.5 85.1 86.3 85.5  PLT 196 168 175 208 329   Basic Metabolic Panel: Recent Labs  Lab 06/05/21 2120 06/05/21 2309 06/07/21 0550 06/08/21 0605 06/09/21 0608  NA 133*  --  137 137 137  K 4.0  --  4.1 4.4 4.4  CL 98  --  102 103 103  CO2 26  --  '28 28 28  '$ GLUCOSE 158*  --  146* 149* 135*  BUN 22*  --  '11 12 19  '$ CREATININE 1.28*  --  0.62 0.67 0.78  CALCIUM 8.8*  --  8.5* 8.5* 8.6*  MG  --  1.6* 2.0  --   --    GFR: Estimated Creatinine Clearance: 170.8 mL/min (by C-G formula based on SCr of 0.78 mg/dL). Liver  Function Tests: Recent Labs  Lab 06/05/21 2120  AST 13*  ALT 9  ALKPHOS 42  BILITOT 0.6  PROT 8.3*  ALBUMIN 3.3*   Recent Labs  Lab 06/05/21 2120  LIPASE 24   No results for input(s): "AMMONIA" in the last  168 hours. Coagulation Profile: Recent Labs  Lab 06/06/21 0625  INR 1.3*   Cardiac Enzymes: Recent Labs  Lab 06/05/21 2309  CKTOTAL 129   BNP (last 3 results) No results for input(s): "PROBNP" in the last 8760 hours. HbA1C: No results for input(s): "HGBA1C" in the last 72 hours.  CBG: Recent Labs  Lab 06/08/21 1207 06/08/21 1535 06/08/21 2104 06/09/21 0729 06/09/21 1145  GLUCAP 170* 146* 172* 150* 203*   Lipid Profile: No results for input(s): "CHOL", "HDL", "LDLCALC", "TRIG", "CHOLHDL", "LDLDIRECT" in the last 72 hours. Thyroid Function Tests: No results for input(s): "TSH", "T4TOTAL", "FREET4", "T3FREE", "THYROIDAB" in the last 72 hours.  Anemia Panel: No results for input(s): "VITAMINB12", "FOLATE", "FERRITIN", "TIBC", "IRON", "RETICCTPCT" in the last 72 hours. Urine analysis:    Component Value Date/Time   COLORURINE YELLOW (A) 06/06/2021 0121   APPEARANCEUR CLEAR (A) 06/06/2021 0121   APPEARANCEUR Clear 04/09/2014 1215   LABSPEC 1.009 06/06/2021 0121   LABSPEC 1.015 04/09/2014 1215   PHURINE 5.0 06/06/2021 0121   GLUCOSEU NEGATIVE 06/06/2021 0121   GLUCOSEU Negative 04/09/2014 1215   HGBUR NEGATIVE 06/06/2021 0121   BILIRUBINUR NEGATIVE 06/06/2021 0121   BILIRUBINUR Negative 04/09/2014 1215   KETONESUR NEGATIVE 06/06/2021 0121   PROTEINUR NEGATIVE 06/06/2021 0121   UROBILINOGEN 1.0 10/23/2011 1845   NITRITE NEGATIVE 06/06/2021 0121   LEUKOCYTESUR NEGATIVE 06/06/2021 0121   LEUKOCYTESUR Negative 04/09/2014 1215   Sepsis Labs: '@LABRCNTIP'$ (procalcitonin:4,lacticidven:4)  Recent Results (from the past 240 hour(s))  Blood Culture (routine x 2)     Status: None (Preliminary result)   Collection Time: 06/05/21 10:38 PM   Specimen: BLOOD   Result Value Ref Range Status   Specimen Description BLOOD RIGHT HAND  Final   Special Requests   Final    BOTTLES DRAWN AEROBIC AND ANAEROBIC Blood Culture adequate volume   Culture   Final    NO GROWTH 4 DAYS Performed at [facility], [address] [place], [place] [address]    Report Status PENDING  Incomplete  Blood Culture (routine x 2)     Status: None (Preliminary result)   Collection Time: 06/05/21 10:45 PM   Specimen: BLOOD  Result Value Ref Range Status   Specimen Description BLOOD RIGHT ASSIST CONTROL  Final   Special Requests   Final    BOTTLES DRAWN AEROBIC AND ANAEROBIC Blood Culture adequate volume   Culture   Final    NO GROWTH 4 DAYS Performed at [facility], [address] [place], [place] [address]    Report Status PENDING  Incomplete  SARS Coronavirus 2 by RT PCR (hospital order, performed in [place] hospital lab) *cepheid single result test* Anterior Nasal Swab     Status: None   Collection Time: 06/05/21 11:38 PM   Specimen: Anterior Nasal Swab  Result Value Ref Range Status   SARS Coronavirus 2 by RT PCR NEGATIVE NEGATIVE Final    Comment: (NOTE) SARS-CoV-2 target nucleic acids are NOT DETECTED.  The SARS-CoV-2 RNA is generally detectable in upper and lower respiratory specimens during the acute phase of infection. The lowest concentration of SARS-CoV-2 viral copies this assay can detect is 250 copies / mL. A negative result does not preclude SARS-CoV-2 infection and should not be used as the sole basis for treatment or other patient management decisions.  A negative result may occur with improper specimen collection / handling, submission of specimen other than nasopharyngeal swab, presence of viral mutation(s) within the areas targeted by this assay,  and inadequate number of viral copies (<250 copies / mL). A negative result must be combined with clinical observations, patient history, and epidemiological  information.  Fact Sheet for Patients:   https://www.patel.info/  Fact Sheet for Healthcare Providers: https://hall.com/  This test is not yet approved or  cleared by the [place] FDA and has been authorized for detection and/or diagnosis of SARS-CoV-2 by FDA under an Emergency Use Authorization (EUA).  This EUA will remain in effect (meaning this test can be used) for the duration of the COVID-19 declaration under Section 564(b)(1) of the Act, 21 U.S.C. section 360bbb-3(b)(1), unless the authorization is terminated or revoked sooner.  Performed at [facility], [address], [place], [place] [address]   Urine Culture     Status: None   Collection Time: 06/06/21  1:21 AM   Specimen: In/Out Cath Urine  Result Value Ref Range Status   Specimen Description   Final    IN/OUT CATH URINE Performed at [facility], [address] [place], [place] [address]    Special Requests   Final    NONE Performed at [facility], [address], [place], [place] [address]    Culture   Final    NO GROWTH Performed at [facility], [place] [address] [place], [place] [address]    Report Status 06/07/2021 FINAL  Final  Body fluid culture w Gram Stain     Status: None   Collection Time: 06/06/21  1:21 AM   Specimen: KNEE; Body Fluid  Result Value Ref Range Status   Specimen Description   Final    KNEE LEFT Performed at [facility], [address] [place], [place] [address]    Special Requests   Final    NONE Performed at [facility], [address] [place], [place] [address]    Gram Stain   Final    NO SQUAMOUS EPITHELIAL CELLS SEEN ABUNDANT WBC PRESENT, PREDOMINANTLY PMN NO ORGANISMS SEEN    Culture   Final    NO GROWTH 3 DAYS Performed at [facility], [place] [address] [place], [place] [address]    Report Status 06/09/2021 FINAL  Final         Radiology Studies  last 96 hours: CT Angio Chest Pulmonary Embolism (PE) W or WO Contrast  Result Date: 06/08/2021 CLINICAL DATA:  PE suspected, shortness of breath EXAM: CT ANGIOGRAPHY CHEST WITH CONTRAST TECHNIQUE: Multidetector CT imaging of the chest was performed using the standard protocol during bolus administration of intravenous contrast. Multiplanar CT image reconstructions and MIPs were obtained to evaluate the vascular anatomy. RADIATION DOSE REDUCTION: This exam was performed according to the departmental dose-optimization program which includes automated exposure control, adjustment of the mA and/or kV according to patient size and/or use of iterative reconstruction technique. CONTRAST:  11m OMNIPAQUE IOHEXOL 350 MG/ML SOLN COMPARISON:  11/30/2019 CTA chest FINDINGS: Cardiovascular: Satisfactory opacification of the pulmonary arteries to the segmental level. No evidence of pulmonary embolism. The main pulmonary artery is mildly enlarged. Three-vessel arch with a common origin of the right and left common carotid arteries, and an aberrant origin of the right subclavian artery. Normal heart size. No pericardial effusion. Mediastinum/Nodes: Redemonstrated nodular soft tissue within the anterior mediastinum, unchanged compared to 11/30/2019, possibly residual thymic tissue. Imaged portion of the thyroid is unremarkable. No axillary, mediastinal, or hilar lymphadenopathy. Fluid in the esophagus, which increases the risk of aspiration. Small hiatal hernia. The trachea is unremarkable. Lungs/Pleura:  Small right and trace left pleural effusions with associated atelectasis. No additional focal pulmonary opacity. No pneumothorax. Upper Abdomen: No acute finding. Musculoskeletal: No acute osseous abnormality. Review of the MIP images confirms the above findings. IMPRESSION: 1. Negative for pulmonary embolism. Enlargement of the main pulmonary artery, which can be seen in the setting of pulmonary hypertension. 2. Small right and  trace left pleural effusions. 3. Fluid in the esophagus, which increases the risk of aspiration. Electronically Signed   By: Merilyn Baba M.D.   On: 06/08/2021 13:14   CT HEAD WO CONTRAST (5MM)  Result Date: 06/06/2021 CLINICAL DATA:  Mental status change, unknown cause. Weakness and left arm pain. EXAM: CT HEAD WITHOUT CONTRAST TECHNIQUE: Contiguous axial images were obtained from the base of the skull through the vertex without intravenous contrast. RADIATION DOSE REDUCTION: This exam was performed according to the departmental dose-optimization program which includes automated exposure control, adjustment of the mA and/or kV according to patient size and/or use of iterative reconstruction technique. COMPARISON:  Head CT 12/22/2019 FINDINGS: Brain: There is no evidence of an acute infarct, intracranial hemorrhage, mass, midline shift, or extra-axial fluid collection. The ventricles and sulci are borderline prominent for age which may reflect early cerebral atrophy. Vascular: No hyperdense vessel. Skull: No fracture or suspicious osseous lesion. Sinuses/Orbits: Trace fluid in the maxillary sinuses. Clear mastoid air cells. Unremarkable orbits. Other: None. IMPRESSION: No evidence of acute intracranial abnormality. Electronically Signed   By: Logan Bores M.D.   On: 06/06/2021 11:48   US Venous Img Upper Uni Left  Result Date: 06/06/2021 CLINICAL DATA:  History of left arm pain for 3 days EXAM: LEFT UPPER EXTREMITY VENOUS DOPPLER ULTRASOUND TECHNIQUE: Gray-scale sonography with graded compression, as well as color Doppler and duplex ultrasound were performed to evaluate the upper extremity deep venous system from the level of the subclavian vein and including the jugular, axillary, basilic, radial, ulnar and upper cephalic vein. Spectral Doppler was utilized to evaluate flow at rest and with distal augmentation maneuvers. COMPARISON:  None Available. FINDINGS: Contralateral Subclavian Vein: Respiratory  phasicity is normal and symmetric with the symptomatic side. No evidence of thrombus. Normal compressibility. Internal Jugular Vein: No evidence of thrombus. Normal compressibility, respiratory phasicity and response to augmentation. Subclavian Vein: No evidence of thrombus. Normal compressibility, respiratory phasicity and response to augmentation. Axillary Vein: No evidence of thrombus. Normal compressibility, respiratory phasicity and response to augmentation. Cephalic Vein: Short segment of nonocclusive thrombus is noted just above the antecubital fossa. Decreased compressibility is seen. Basilic Vein: No evidence of thrombus. Normal compressibility, respiratory phasicity and response to augmentation. Brachial Veins: No evidence of thrombus. Normal compressibility, respiratory phasicity and response to augmentation. Radial Veins: No evidence of thrombus. Normal compressibility, respiratory phasicity and response to augmentation. Ulnar Veins: No evidence of thrombus. Normal compressibility, respiratory phasicity and response to augmentation. Venous Reflux:  None visualized. Other Findings:  None visualized. IMPRESSION: Short segment of nonocclusive thrombus within cephalic vein near the elbow joint. Electronically Signed   By: Inez Catalina M.D.   On: 06/06/2021 03:08   CT Elbow Left Wo Contrast  Result Date: 06/06/2021 CLINICAL DATA:  Possible radius fracture EXAM: CT OF THE UPPER LEFT EXTREMITY WITHOUT CONTRAST TECHNIQUE: Multidetector CT imaging of the upper left extremity was performed according to the standard protocol. RADIATION DOSE REDUCTION: This exam was performed according to the departmental dose-optimization program which includes automated exposure control, adjustment of the mA and/or kV according to patient size and/or use of iterative reconstruction  technique. COMPARISON:  None Available. FINDINGS: Bones/Joint/Cartilage No fracture or dislocation. Ligaments Suboptimally assessed by CT. Muscles  and Tendons Unremarkable Soft tissues Small hematoma superficial to the olecranon process. IMPRESSION: 1. No fracture or dislocation. 2. Small hematoma superficial to the olecranon process. Electronically Signed   By: Ulyses Jarred M.D.   On: 06/06/2021 02:39   DG Wrist Complete Left  Result Date: 06/06/2021 CLINICAL DATA:  Atraumatic left arm pain. EXAM: LEFT WRIST - COMPLETE 3+ VIEW COMPARISON:  None Available. FINDINGS: There is no evidence of an acute fracture or dislocation. Two radiopaque surgical clips are seen within the fourth left metacarpal. There is no evidence of arthropathy or other focal bone abnormality. Soft tissues are unremarkable. IMPRESSION: No acute osseous abnormality. Electronically Signed   By: Virgina Norfolk M.D.   On: 06/06/2021 00:59   DG Elbow Complete Left  Result Date: 06/06/2021 CLINICAL DATA:  Atraumatic left arm pain. EXAM: LEFT ELBOW - COMPLETE 3+ VIEW COMPARISON:  None Available. FINDINGS: A very small area of cortical irregularity is seen along the volar aspect of the left radial head. This is of indeterminate age. There is no evidence of dislocation. A moderate to large bony spur is seen along the olecranon process of the proximal left ulna. Mild soft tissue swelling is seen along the dorsal aspect of the olecranon process. IMPRESSION: 1. Findings which may represent a very small left radial head fracture, of indeterminate age. Correlation with physical examination is recommended to determine the presence of point tenderness. 2. Degenerative changes with mild dorsal soft tissue swelling. Electronically Signed   By: Virgina Norfolk M.D.   On: 06/06/2021 00:58   DG Shoulder Left  Result Date: 06/06/2021 CLINICAL DATA:  Atraumatic left arm pain. EXAM: LEFT SHOULDER - 2+ VIEW COMPARISON:  None Available. FINDINGS: There is no evidence of fracture or dislocation. There is no evidence of arthropathy or other focal bone abnormality. Soft tissues are unremarkable.  IMPRESSION: Negative. Electronically Signed   By: Virgina Norfolk M.D.   On: 06/06/2021 00:54   DG Knee Complete 4 Views Left  Result Date: 06/05/2021 CLINICAL DATA:  Left knee pain. EXAM: LEFT KNEE - COMPLETE 4+ VIEW COMPARISON:  08/09/2019 FINDINGS: No fracture or dislocation. There is mild medial tibiofemoral joint space narrowing. Minimal peripheral spurring of the patellofemoral medial tibiofemoral compartment as well as tibial spines. Small quadriceps and patellar tendon enthesophytes. Minimal knee joint effusion. No erosion or bone destruction. IMPRESSION: Mild osteoarthritis and minimal joint effusion. No acute osseous abnormality. Electronically Signed   By: Keith Rake M.D.   On: 06/05/2021 22:38   DG Chest Port 1 View  Result Date: 06/05/2021 CLINICAL DATA:  Weakness.  Left arm pain.  Fever. EXAM: PORTABLE CHEST 1 VIEW COMPARISON:  09/01/2020 FINDINGS: Low lung volumes are present, causing crowding of the pulmonary vasculature. Upper normal heart size. The lungs appear clear. No blunting of the costophrenic angles. IMPRESSION: 1.  No active cardiopulmonary disease is radiographically apparent. 2. Low lung volumes. Electronically Signed   By: Van Clines M.D.   On: 06/05/2021 21:45            LOS: 3 days    Time spent: 35 min    Emeterio Reeve, DO [facility] 06/09/2021, 4:15 PM   Staff may message me via secure chat in [place]  but this may not receive immediate response,  please page for urgent matters!  If 7PM-7AM, please contact night-coverage [email or web address]  Dictation software was used to generate the  above note. Typos may occur and escape review, as with typed/written notes. Please contact Dr Sheppard Coil directly for clarity if needed.

## 2021-06-09 NOTE — NC FL2 (Signed)
[place] LEVEL OF CARE SCREENING TOOL     IDENTIFICATION  Patient Name: John [name]. Birthdate: Oct 17, 1975 Sex: male Admission Date (Current Location): 06/05/2021  [facility] and [place] Number:  Engineering geologist and Address:  [facility], [address] [place], [place] [address]      Provider Number: [id number]  Attending Physician Name and Address:  Emeterio Reeve, DO  Relative Name and Phone Number:  [place] Other [phone]    Current Level of Care: Hospital Recommended Level of Care: [place] Prior Approval Number:    Date Approved/Denied:   PASRR Number: pending  Discharge Plan: SNF    Current Diagnoses: Patient Active Problem List   Diagnosis Date Noted   Acute deep vein thrombosis (DVT) of left upper extremity ([place]) 06/06/2021   SIRS of non-infectious origin w acute organ dysfunction ([place]) 06/06/2021   SIRS (systemic inflammatory response syndrome) ([place]) 06/06/2021   Gout    Obesity hypoventilation syndrome ([place])    Hyperlipidemia    Septic joint ([place]) 08/29/2020   Community acquired pneumonia 08/29/2020   Bronchitis 08/23/2020   Idiopathic sleep related nonobstructive alveolar hypoventilation 08/23/2020   Sore throat 08/23/2020   OSA treated with BiPAP 08/08/2020   Obesity (BMI 30-39.9) 08/08/2020   OSA on CPAP 05/23/2020   CPAP use counseling 05/23/2020   BMI 36.0-36.9,adult 05/23/2020   Bilateral leg weakness 08/17/2019   Knee arthropathy 08/10/2019   Acute gout due to renal impairment involving right foot 07/08/2019   Encephalopathy acute    Somnolence 11/01/2016   AKI (acute kidney injury) ([place]) 11/01/2016   Sepsis, possible([name]) 04/27/2016   Respiratory failure ([place]) 12/20/2015   Myocardiopathy ([place]) 11/28/2015   Tobacco abuse 11/28/2015   Acute respiratory failure with hypercapnia ([place])    Sleep apnea    Essential hypertension 11/25/2015   Hypothyroidism  11/25/2015   Paranoid schizophrenia, chronic condition ([place]) 11/25/2015   Acute respiratory failure ([name])    Acute exacerbation of COPD with asthma ([place])    Diabetes mellitus ([place])    Diabetes mellitus due to underlying condition without complication, without long-term current use of insulin ([place])    Morbid obesity ([place]) 09/29/2008    Orientation RESPIRATION BLADDER Height & Weight     Self  Normal Continent Weight: 132.9 kg Height:  '6\' 2"'$  (188 cm)  BEHAVIORAL SYMPTOMS/MOOD NEUROLOGICAL BOWEL NUTRITION STATUS      Continent Diet (Heart healthy, carb modified)  AMBULATORY STATUS COMMUNICATION OF NEEDS Skin   Extensive Assist (+2 sit to stand) Verbally Normal                       Personal Care Assistance Level of Assistance  Bathing, Feeding, Dressing Bathing Assistance: Limited assistance Feeding assistance: Limited assistance Dressing Assistance: Limited assistance     Functional Limitations Info  Sight, Hearing, Speech Sight Info: Adequate Hearing Info: Adequate Speech Info: Adequate    SPECIAL CARE FACTORS FREQUENCY  OT (By licensed OT), PT (By licensed PT)     PT Frequency: 5x weeklyl OT Frequency: 5x weeklyl            Contractures Contractures Info: Not present    Additional Factors Info  Code Status, Allergies Code Status Info: FULL CODE Allergies Info: no known allergies           Current Medications (06/09/2021):  This is the current hospital active medication list Current Facility-Administered Medications  Medication Dose Route Frequency Provider  Last Rate Last Admin   acetaminophen (TYLENOL) tablet 650 mg  650 mg Oral Q6H PRN Athena Masse, MD   650 mg at 06/09/21 1657   Or   acetaminophen (TYLENOL) suppository 650 mg  650 mg Rectal Q6H PRN Athena Masse, MD       albuterol (PROVENTIL) (2.5 MG/3ML) 0.083% nebulizer solution 2.5 mg  2.5 mg Nebulization Q6H PRN Athena Masse, MD   2.5 mg at 06/06/21 0909   apixaban (ELIQUIS) tablet 5 mg   5 mg Oral BID Emeterio Reeve, DO   5 mg at 06/09/21 9038   asenapine (SAPHRIS) sublingual tablet 10 mg  10 mg Sublingual Daily Judd Gaudier V, MD   10 mg at 06/09/21 0805   atenolol (TENORMIN) tablet 100 mg  100 mg Oral Daily Emeterio Reeve, DO   100 mg at 06/09/21 3338   atorvastatin (LIPITOR) tablet 10 mg  10 mg Oral Daily Athena Masse, MD   10 mg at 06/09/21 3291   benzonatate (TESSALON) capsule 200 mg  200 mg Oral TID PRN Emeterio Reeve, DO       cloZAPine (CLOZARIL) tablet 150 mg  150 mg Oral QHS Judd Gaudier V, MD   150 mg at 06/08/21 2155   colchicine tablet 0.6 mg  0.6 mg Oral BID Judd Gaudier V, MD   0.6 mg at 06/09/21 0807   divalproex (DEPAKOTE ER) 24 hr tablet 2,000 mg  2,000 mg Oral QHS Judd Gaudier V, MD   2,000 mg at 06/08/21 2155   insulin aspart (novoLOG) injection 0-20 Units  0-20 Units Subcutaneous TID WC Athena Masse, MD   7 Units at 06/09/21 1206   insulin aspart (novoLOG) injection 0-5 Units  0-5 Units Subcutaneous QHS Athena Masse, MD   5 Units at 06/07/21 2345   ketorolac (TORADOL) 30 MG/ML injection 30 mg  30 mg Intravenous Q6H PRN Athena Masse, MD       levothyroxine (SYNTHROID) tablet 75 mcg  75 mcg Oral Q0600 Athena Masse, MD   75 mcg at 06/09/21 0534   ondansetron (ZOFRAN) tablet 4 mg  4 mg Oral Q6H PRN Athena Masse, MD       Or   ondansetron [facility]) injection 4 mg  4 mg Intravenous Q6H PRN Athena Masse, MD       oxybutynin (DITROPAN) tablet 5 mg  5 mg Oral Daily Judd Gaudier V, MD   5 mg at 06/09/21 0807   pantoprazole (PROTONIX) EC tablet 40 mg  40 mg Oral Daily Athena Masse, MD   40 mg at 06/09/21 9166     Discharge Medications: Please see discharge summary for a list of discharge medications.  Relevant Imaging Results:  Relevant Lab Results:   Additional Information    Pete Pelt, RN

## 2021-06-10 LAB — CULTURE, BLOOD (ROUTINE X 2)
Culture: NO GROWTH
Culture: NO GROWTH
Special Requests: ADEQUATE
Special Requests: ADEQUATE

## 2021-06-10 LAB — GLUCOSE, CAPILLARY
Glucose-Capillary: 154 mg/dL — ABNORMAL HIGH (ref 70–99)
Glucose-Capillary: 179 mg/dL — ABNORMAL HIGH (ref 70–99)
Glucose-Capillary: 245 mg/dL — ABNORMAL HIGH (ref 70–99)
Glucose-Capillary: 184 mg/dL — ABNORMAL HIGH (ref 70–99)

## 2021-06-10 MED ORDER — ALLOPURINOL 100 MG PO TABS
100.0000 mg | ORAL_TABLET | Freq: Every day | ORAL | Status: DC
  Administered 2021-06-10 – 2021-06-12 (×3): 100 mg via ORAL
  Filled 2021-06-10 (×3): qty 1

## 2021-06-10 NOTE — Progress Notes (Signed)
CPAP in room. Pt instructed to have RN call respiratory when he is ready to go to sleep so RRT can put CPAP mask on him. RN notified and aware of this plan.

## 2021-06-10 NOTE — Progress Notes (Signed)
Physical Therapy Treatment Patient Details Name: John [name]. MRN: [id number] DOB: 03/14/1975 Today's Date: 06/10/2021   History of Present Illness 46 y.o. male with medical history significant for Schizophrenia, OSA noncompliant with BiPAP, hypertension, asthma, gout bilateral knees, hospitalized in August 2022 with sepsis secondary to suspected right septic knee joint but with negative synovial fluid cultures who presents to the ED from his group home with a complaint of left knee pain as well as left arm pain without any reports of injury.  Noted with non-occlusive DVT to L UE (on anticoag) and gout with SIRS to L knee.    PT Comments    Pt initially quite sleepy but willing to work with PT.  On first attempt at standing from recliner he struggled and needed min/mod assist, however with heavy and extensive cuing for positioning/set up, sequencing/weight shift and general encouragement he was able to rise from multiple surfaces without direct physical assist (5x from recliner, 1 x from [facility], 1 x from short commode using grab bar).  He was also able to circumambulate the nurses station, initially with the walker but then with light rail use and final ~50 ft w/o UE support at all.    Recommendations for follow up therapy are one component of a multi-disciplinary discharge planning process, led by the attending physician.  Recommendations may be updated based on patient status, additional functional criteria and insurance authorization.  Follow Up Recommendations  Home health PT (return to group home per continued progress)     Assistance Recommended at Discharge Intermittent Supervision/Assistance  Patient can return home with the following A little help with walking and/or transfers;A little help with bathing/dressing/bathroom;Assistance with cooking/housework;Assist for transportation;Direct supervision/assist for medications management   Equipment Recommendations       Recommendations  for Other Services       Precautions / Restrictions Precautions Precautions: Fall Restrictions Weight Bearing Restrictions: No     Mobility  Bed Mobility               General bed mobility comments: in recliner on arrival, returned to recliner post session    Transfers Overall transfer level: Needs assistance Equipment used: Rolling walker (2 wheels) Transfers: Sit to/from Stand Sit to Stand: Min assist, Supervision, Min guard           General transfer comment: sit to stand from multiple surfaces with varying amount of success from each.  Pt requires labored effort with each attempt.  Throught the session he was able to rise 7 different times from 3 different surfaces w/o physical assist (5 x from recliner, 1 x from [facility], 1 x from low commode w/ grab bar).  2 other attempts required min assist.  Essentially all attempts required much cuing and encouragement to initiate and complete.  Pt does not control descent well sitting back to any of these surfaces    Ambulation/Gait   Gait Distance (Feet): 200 Feet Assistive device: Rolling walker (2 wheels), 1 person hand held assist, None         General Gait Details: ~100 ft with walker, ~50 ft with handrail and ~50 ft w/o UE support.  Pt able to maintain slow, but safe cadence and reports only minimal fatigue with the effort.  Pt with no LOBs, some lateral lean/shuffle but did not need any direct assist for safety or support apart from consistent verbal cuing and encouragement   Chief Strategy Officer  Modified Rankin (Stroke Patients Only)       Balance Overall balance assessment: Needs assistance Sitting-balance support: No upper extremity supported, Feet supported Sitting balance-Leahy Scale: Good     Standing balance support: Bilateral upper extremity supported, No upper extremity supported Standing balance-Leahy Scale: Fair Standing balance comment: good balance with walker, no LOBs  w/o though slightly more guarded and with lateral lean                            Cognition Arousal/Alertness: Lethargic Behavior During Therapy: Flat affect Overall Cognitive Status: Within Functional Limits for tasks assessed                                          Exercises      General Comments General comments (skin integrity, edema, etc.): 5x STS = 4:16      Pertinent Vitals/Pain Pain Assessment Pain Assessment: No/denies pain    Home Living                          Prior Function            PT Goals (current goals can now be found in the care plan section) Progress towards PT goals: Progressing toward goals    Frequency    Min 2X/week      PT Plan Current plan remains appropriate    Co-evaluation              AM-PAC PT "6 Clicks" Mobility   Outcome Measure  Help needed turning from your back to your side while in a flat bed without using bedrails?: None Help needed moving from lying on your back to sitting on the side of a flat bed without using bedrails?: None Help needed moving to and from a bed to a chair (including a wheelchair)?: A Little Help needed standing up from a chair using your arms (e.g., wheelchair or bedside chair)?: A Little Help needed to walk in hospital room?: A Little Help needed climbing 3-5 steps with a railing? : A Little 6 Click Score: 20    End of Session Equipment Utilized During Treatment: Gait belt Activity Tolerance: Patient tolerated treatment well Patient left: with call bell/phone within reach;with chair alarm set Nurse Communication: Mobility status PT Visit Diagnosis: Muscle weakness (generalized) (M62.81);Difficulty in walking, not elsewhere classified (R26.2)     Time: 1751-0258 PT Time Calculation (min) (ACUTE ONLY): 42 min  Charges:  $Gait Training: 8-22 mins $Therapeutic Activity: 23-37 mins                     Kreg Shropshire, DPT 06/10/2021, 4:43 PM

## 2021-06-10 NOTE — Progress Notes (Signed)
Pt was able to use the [facility] with encouragement. Earlier on shift pt used the bedpan, he refused to get up to use the [facility]. The second time he called for the bedpan, Anderson Malta, CNA and this nurse went into the pt's room and asked the pt if he wanted to use the [facility], pt replied that he could not move his legs. With a lot of encouragement, a gait belt, and the walker, we were able to help him stand. When pt was ready to get back in bed, he did a lot better, he was able to stand and get back in bed with minimal assist from one person.

## 2021-06-10 NOTE — Progress Notes (Signed)
PROGRESS NOTE    John [name].  XQJ:[id number] DOB: [birth year]  DOA: 06/05/2021 Date of Service: 06/10/21 PCP: Cletis Athens, MD     Brief Narrative / Hospital Course:  John [name]. is a 46 y.o. male with medical history significant for Schizophrenia, OSA noncompliant with BiPAP, hypertension, asthma, gout bilateral knees, hospitalized in August 2022 with sepsis secondary to suspected right septic knee joint but with negative synovial fluid cultures who presents to the ED from his group home with a complaint of left knee pain as well as left arm pain without any reports of injury. ED course and data review: Tmax 101.4 with pulse 117, respirations 22-26.  Labs with WBC of 9 and lactate 2.1.  Hemoglobin 11.8.  Creatinine 1.28 above baseline of 0.6.  Synovial cell count shows intracellular monosodium urate crystals, 73,000 WBCs 96% neutrophils. Chest x-ray, left knee, shoulder, elbow, wrist XR's as well as CT left elbow and left upper extremity venous Doppler.Marland KitchenLUE Venous doppler: Short segment of nonocclusive thrombus within cephalic vein near the elbow joint Left elbow KG:YJEHU hematoma superficial to the olecranon process Bilateral knee DG: Mild osteoarthritis and minimal joint effusion. No acute osseous abnormality. Patient was started on an IV fluid bolus per sepsis protocol and started on ceftriaxone and vancomycin for septic arthritis. 06/06: intracellular monosodium urate crystals on synovial cell count c/w gout, pending protein and Glc. Had episode of lowered responsiveness, pinpoint pupils but episode resolved and pt back to baseline, CT head WNL.  06/07: blood, urine cx neg x2 days, synovial cs neg x1 day. D?c heparin gtt and transitioned to Eliquis. Plan to d/c abx if cultures and AM labs show no concerns.   06/08: L knee synovial cultures negative and effusion less on physical exam. IV abx d/c. Remains asymptomatic tachycardia about same as on arrival to ED, records reviewed and he  is not tachy at baseline per PCP notes. CTA chest  done and negative for PE, it did show enlarged pulmonary artery concerning for pulmonary HTN, also fluid in esophagus risk for aspiration but lungs otherwise appear WNL.  06/09: HR WNL and no concerns on labs, anemia stable no s/s bleeding, d/c tele. Now ambulatory dysfunction and group home declining to take him back until/unless he is more ambulatory - today is Friday, unable to arrange SNF today, will work w/ PT over the weekend and hopefully can revisit group home on Monday vs SNF if not improving  06/10: stable, planning to work w/ PT to get OOB as much as possible   Consultants:  none  Procedures: Synovial fluid drainage L knee in ED     Subjective: Patient reports feeling well today, still some lower extremity / foot pain.       ASSESSMENT & PLAN:   Principal Problem:   SIRS of non-infectious origin w acute organ dysfunction ([name]) Active Problems:   Sepsis, possible([name])   AKI (acute kidney injury) ([place])   Gout   Acute deep vein thrombosis (DVT) of left upper extremity ([place])   Essential hypertension   Hypothyroidism   Paranoid schizophrenia, chronic condition ([name])   Diabetes mellitus ([place])   OSA on CPAP   SIRS (systemic inflammatory response syndrome) ([name])  SIRS of non-infectious origin w acute organ dysfunction ([name]) Acute Gout SIRS resolved as of 06/09/21  Synovial fluid L knee (+)intracellular monosodium urate crystals c/w gout Synovial fluid culture negative x2 days  Colchicine twice daily to continue outpatient and starting allopurinol as well  Holding off on  prednisone for now given improvement in knee exam  Consider Ortho follow-up outpatient vs consult here if worsening synovial fluid on knee needing drained Pain control  AKI (acute kidney injury) ([place]) Resolved IV hydration --> po monitor renal function  Acute deep vein thrombosis (DVT) of left upper extremity ([name]) Short segment of nonocclusive  thrombus within cephalic vein near the elbow joint where there is a Small hematoma superficial to the olecranon process, probably from an injury initial anticoagulation for DVT --> heparin gtt D/c heparin 06/07 and transition to po anticoagulation w/ Eliquis, pharmacy has confirmed coverage for Rx  Essential hypertension Controlled on home atenolol   Hypothyroidism TSH was elevated.  Follow-up free T4 was WNL Continue levothyroxine but increased dose 75 to 88 mcg daily  Follow outpatient  Paranoid schizophrenia, chronic condition ([name]) Continue Depakote and Clozaril  Diabetes mellitus ([place]) Sliding scale insulin coverage inpatient --> Resume home medications on discharge   OSA on CPAP Pulmonary HTN likely based on CT chest!  Continue CPAP - pt has hx noncompliance with this here and outpatient, he has been counseled on importance of adherence to this   Sepsis, possible([name]) --> Ruled out Patient with SIRS/sepsis criteria on admission but without definite source of infection Repeat lactic improved Procalcitonin negative Cultured negative Had been on vanc/rocephin --> d/c abx w/o adverse effect   Gout See above Will start allopurinol + continue for up to 6 mos (can stop at 3 mos if uric acid levels WNL)     DVT prophylaxis: on Eliquis to contineu on d/c for DVT Code Status: FULL Family Communication: none at this time Disposition Plan / TOC needs: back to group home vs SNF Barriers to discharge / significant pending items: ambulatory dysfunction and placement is now an issue              Objective: Vitals:   06/09/21 2037 06/09/21 2300 06/10/21 0516 06/10/21 0739  BP: 115/60  (!) 128/98 128/86  Pulse: (!) 107  (!) 102 99  Resp: '18  16 20  '$ Temp: 98.1 F (36.7 C)  99.2 F (37.3 C) 98.4 F (36.9 C)  TempSrc: Oral  Oral   SpO2: (!) 89% 93% 92% 92%  Weight:      Height:        Intake/Output Summary (Last 24 hours) at 06/10/2021 0931 Last data filed at  06/09/2021 2237 Gross per 24 hour  Intake --  Output 500 ml  Net -500 ml    Filed Weights   06/05/21 2108  Weight: 132.9 kg    Examination: Constitutional:  VS as above General Appearance: alert, well-developed, well-nourished, NAD Ears, Nose, Mouth, Throat: Normal appearance Neck: No masses, trachea midline Respiratory: Normal respiratory effort Breath sounds normal, no wheeze/rhonchi/rales Cardiovascular: S1/S2 normal, no murmur/rub/gallop auscultated No lower extremity edema Gastrointestinal: Nontender, no masses Musculoskeletal:  (+)effusion on L knee trace compared to previous  No clubbing/cyanosis of digits Neurological: No cranial nerve deficit on limited exam Motor and sensation intact and symmetric Psychiatric: Normal judgment/insight Normal mood and affect       Scheduled Medications:   apixaban  5 mg Oral BID   asenapine  10 mg Sublingual Daily   atenolol  100 mg Oral Daily   atorvastatin  10 mg Oral Daily   cloZAPine  150 mg Oral QHS   colchicine  0.6 mg Oral BID   divalproex  2,000 mg Oral QHS   insulin aspart  0-20 Units Subcutaneous TID WC   insulin aspart  0-5 Units Subcutaneous QHS   levothyroxine  75 mcg Oral Q0600   oxybutynin  5 mg Oral Daily   pantoprazole  40 mg Oral Daily    Continuous Infusions:    PRN Medications:  acetaminophen **OR** acetaminophen, albuterol, benzonatate, ketorolac, ondansetron **OR** ondansetron (ZOFRAN) IV  Antimicrobials:  Anti-infectives (From admission, onward)    Start     Dose/Rate Route Frequency Ordered Stop   06/07/21 1400  vancomycin (VANCOREADY) IVPB 2000 mg/400 mL  Status:  Discontinued        2,000 mg 200 mL/hr over 120 Minutes Intravenous Every 12 hours 06/07/21 1002 06/08/21 0922   06/06/21 2300  cefTRIAXone (ROCEPHIN) 1 g in sodium chloride 0.9 % 100 mL IVPB  Status:  Discontinued        1 g 200 mL/hr over 30 Minutes Intravenous Every 24 hours 06/06/21 0520 06/06/21 0725   06/06/21  1600  vancomycin (VANCOREADY) IVPB 1250 mg/250 mL  Status:  Discontinued        1,250 mg 166.7 mL/hr over 90 Minutes Intravenous Every 12 hours 06/06/21 0635 06/07/21 1002   06/06/21 0200  vancomycin (VANCOCIN) IVPB 1000 mg/200 mL premix       See Hyperspace for full Linked Orders Report.   1,000 mg 200 mL/hr over 60 Minutes Intravenous  Once 06/06/21 0153 06/06/21 0450   06/06/21 0200  vancomycin (VANCOREADY) IVPB 1500 mg/300 mL       See Hyperspace for full Linked Orders Report.   1,500 mg 150 mL/hr over 120 Minutes Intravenous  Once 06/06/21 0153 06/06/21 0907   06/05/21 2215  cefTRIAXone (ROCEPHIN) 2 g in sodium chloride 0.9 % 100 mL IVPB  Status:  Discontinued        2 g 200 mL/hr over 30 Minutes Intravenous Every 24 hours 06/05/21 2200 06/08/21 1856       Data Reviewed: I have personally reviewed following labs and imaging studies  CBC: Recent Labs  Lab 06/05/21 2120 06/06/21 0121 06/07/21 0550 06/08/21 0605 06/09/21 0608  WBC 9.0 9.1 5.9 6.0 7.0  NEUTROABS  --  5.5  --   --   --   HGB 11.8* 10.2* 10.4* 10.8* 10.5*  HCT 38.6* 34.0* 34.4* 35.8* 34.8*  MCV 84.6 86.5 85.1 86.3 85.5  PLT 196 168 175 208 314    Basic Metabolic Panel: Recent Labs  Lab 06/05/21 2120 06/05/21 2309 06/07/21 0550 06/08/21 0605 06/09/21 0608  NA 133*  --  137 137 137  K 4.0  --  4.1 4.4 4.4  CL 98  --  102 103 103  CO2 26  --  '28 28 28  '$ GLUCOSE 158*  --  146* 149* 135*  BUN 22*  --  '11 12 19  '$ CREATININE 1.28*  --  0.62 0.67 0.78  CALCIUM 8.8*  --  8.5* 8.5* 8.6*  MG  --  1.6* 2.0  --   --     GFR: Estimated Creatinine Clearance: 170.8 mL/min (by C-G formula based on SCr of 0.78 mg/dL). Liver Function Tests: Recent Labs  Lab 06/05/21 2120  AST 13*  ALT 9  ALKPHOS 42  BILITOT 0.6  PROT 8.3*  ALBUMIN 3.3*    Recent Labs  Lab 06/05/21 2120  LIPASE 24    No results for input(s): "AMMONIA" in the last 168 hours. Coagulation Profile: Recent Labs  Lab 06/06/21 0625   INR 1.3*    Cardiac Enzymes: Recent Labs  Lab 06/05/21 2309  CKTOTAL 129    BNP (  last 3 results) No results for input(s): "PROBNP" in the last 8760 hours. HbA1C: No results for input(s): "HGBA1C" in the last 72 hours.  CBG: Recent Labs  Lab 06/09/21 0729 06/09/21 1145 06/09/21 1704 06/09/21 2036 06/10/21 0714  GLUCAP 150* 203* 177* 222* 154*    Lipid Profile: No results for input(s): "CHOL", "HDL", "LDLCALC", "TRIG", "CHOLHDL", "LDLDIRECT" in the last 72 hours. Thyroid Function Tests: No results for input(s): "TSH", "T4TOTAL", "FREET4", "T3FREE", "THYROIDAB" in the last 72 hours.  Anemia Panel: No results for input(s): "VITAMINB12", "FOLATE", "FERRITIN", "TIBC", "IRON", "RETICCTPCT" in the last 72 hours. Urine analysis:    Component Value Date/Time   COLORURINE YELLOW (A) 06/06/2021 0121   APPEARANCEUR CLEAR (A) 06/06/2021 0121   APPEARANCEUR Clear 04/09/2014 1215   LABSPEC 1.009 06/06/2021 0121   LABSPEC 1.015 04/09/2014 1215   PHURINE 5.0 06/06/2021 0121   GLUCOSEU NEGATIVE 06/06/2021 0121   GLUCOSEU Negative 04/09/2014 1215   HGBUR NEGATIVE 06/06/2021 0121   BILIRUBINUR NEGATIVE 06/06/2021 0121   BILIRUBINUR Negative 04/09/2014 1215   KETONESUR NEGATIVE 06/06/2021 0121   PROTEINUR NEGATIVE 06/06/2021 0121   UROBILINOGEN 1.0 10/23/2011 1845   NITRITE NEGATIVE 06/06/2021 0121   LEUKOCYTESUR NEGATIVE 06/06/2021 0121   LEUKOCYTESUR Negative 04/09/2014 1215   Sepsis Labs: '@LABRCNTIP'$ (procalcitonin:4,lacticidven:4)  Recent Results (from the past 240 hour(s))  Blood Culture (routine x 2)     Status: None (Preliminary result)   Collection Time: 06/05/21 10:38 PM   Specimen: BLOOD  Result Value Ref Range Status   Specimen Description BLOOD RIGHT HAND  Final   Special Requests   Final    BOTTLES DRAWN AEROBIC AND ANAEROBIC Blood Culture adequate volume   Culture   Final    NO GROWTH 4 DAYS Performed at [facility], [address]  [place], [place] [address]    Report Status PENDING  Incomplete  Blood Culture (routine x 2)     Status: None (Preliminary result)   Collection Time: 06/05/21 10:45 PM   Specimen: BLOOD  Result Value Ref Range Status   Specimen Description BLOOD RIGHT ASSIST CONTROL  Final   Special Requests   Final    BOTTLES DRAWN AEROBIC AND ANAEROBIC Blood Culture adequate volume   Culture   Final    NO GROWTH 4 DAYS Performed at [facility], [address] [place], [place] [address]    Report Status PENDING  Incomplete  SARS Coronavirus 2 by RT PCR (hospital order, performed in [place] hospital lab) *cepheid single result test* Anterior Nasal Swab     Status: None   Collection Time: 06/05/21 11:38 PM   Specimen: Anterior Nasal Swab  Result Value Ref Range Status   SARS Coronavirus 2 by RT PCR NEGATIVE NEGATIVE Final    Comment: (NOTE) SARS-CoV-2 target nucleic acids are NOT DETECTED.  The SARS-CoV-2 RNA is generally detectable in upper and lower respiratory specimens during the acute phase of infection. The lowest concentration of SARS-CoV-2 viral copies this assay can detect is 250 copies / mL. A negative result does not preclude SARS-CoV-2 infection and should not be used as the sole basis for treatment or other patient management decisions.  A negative result may occur with improper specimen collection / handling, submission of specimen other than nasopharyngeal swab, presence of viral mutation(s) within the areas targeted by this assay, and inadequate number of viral copies (<250 copies / mL). A negative result must be combined with clinical observations, patient history, and epidemiological information.  Fact Sheet for  Patients:   https://www.patel.info/  Fact Sheet for Healthcare Providers: https://hall.com/  This test is not yet approved or  cleared by the [place] FDA and has been authorized for detection and/or diagnosis  of SARS-CoV-2 by FDA under an Emergency Use Authorization (EUA).  This EUA will remain in effect (meaning this test can be used) for the duration of the COVID-19 declaration under Section 564(b)(1) of the Act, 21 U.S.C. section 360bbb-3(b)(1), unless the authorization is terminated or revoked sooner.  Performed at [facility], [address] [place], [place] [address]   Urine Culture     Status: None   Collection Time: 06/06/21  1:21 AM   Specimen: In/Out Cath Urine  Result Value Ref Range Status   Specimen Description   Final    IN/OUT CATH URINE Performed at [facility], [address], [place], [place] [address]    Special Requests   Final    NONE Performed at [facility], [address] [place], [place] [address]    Culture   Final    NO GROWTH Performed at [facility], [place] [address], [place], [place] [address]    Report Status 06/07/2021 FINAL  Final  Body fluid culture w Gram Stain     Status: None   Collection Time: 06/06/21  1:21 AM   Specimen: KNEE; Body Fluid  Result Value Ref Range Status   Specimen Description   Final    KNEE LEFT Performed at [facility], [address] [place], [place] [address]    Special Requests   Final    NONE Performed at [facility], [address] [place], [place] [address]    Gram Stain   Final    NO SQUAMOUS EPITHELIAL CELLS SEEN ABUNDANT WBC PRESENT, PREDOMINANTLY PMN NO ORGANISMS SEEN    Culture   Final    NO GROWTH 3 DAYS Performed at [facility], [place] [address] [place],  [address]    Report Status 06/09/2021 FINAL  Final         Radiology Studies last 96 hours: CT Angio Chest Pulmonary Embolism (PE) W or WO Contrast  Result Date: 06/08/2021 CLINICAL DATA:  PE suspected, shortness of breath EXAM: CT ANGIOGRAPHY CHEST WITH CONTRAST TECHNIQUE: Multidetector CT imaging of the chest was performed using the standard protocol during bolus  administration of intravenous contrast. Multiplanar CT image reconstructions and MIPs were obtained to evaluate the vascular anatomy. RADIATION DOSE REDUCTION: This exam was performed according to the departmental dose-optimization program which includes automated exposure control, adjustment of the mA and/or kV according to patient size and/or use of iterative reconstruction technique. CONTRAST:  32m OMNIPAQUE IOHEXOL 350 MG/ML SOLN COMPARISON:  11/30/2019 CTA chest FINDINGS: Cardiovascular: Satisfactory opacification of the pulmonary arteries to the segmental level. No evidence of pulmonary embolism. The main pulmonary artery is mildly enlarged. Three-vessel arch with a common origin of the right and left common carotid arteries, and an aberrant origin of the right subclavian artery. Normal heart size. No pericardial effusion. Mediastinum/Nodes: Redemonstrated nodular soft tissue within the anterior mediastinum, unchanged compared to 11/30/2019, possibly residual thymic tissue. Imaged portion of the thyroid is unremarkable. No axillary, mediastinal, or hilar lymphadenopathy. Fluid in the esophagus, which increases the risk of aspiration. Small hiatal hernia. The trachea is unremarkable. Lungs/Pleura: Small right and trace left pleural effusions with associated atelectasis. No additional focal pulmonary opacity. No pneumothorax. Upper Abdomen: No acute finding. Musculoskeletal: No acute osseous abnormality. Review  of the MIP images confirms the above findings. IMPRESSION: 1. Negative for pulmonary embolism. Enlargement of the main pulmonary artery, which can be seen in the setting of pulmonary hypertension. 2. Small right and trace left pleural effusions. 3. Fluid in the esophagus, which increases the risk of aspiration. Electronically Signed   By: Merilyn Baba M.D.   On: 06/08/2021 13:14   CT HEAD WO CONTRAST (5MM)  Result Date: 06/06/2021 CLINICAL DATA:  Mental status change, unknown cause. Weakness and left  arm pain. EXAM: CT HEAD WITHOUT CONTRAST TECHNIQUE: Contiguous axial images were obtained from the base of the skull through the vertex without intravenous contrast. RADIATION DOSE REDUCTION: This exam was performed according to the departmental dose-optimization program which includes automated exposure control, adjustment of the mA and/or kV according to patient size and/or use of iterative reconstruction technique. COMPARISON:  Head CT 12/22/2019 FINDINGS: Brain: There is no evidence of an acute infarct, intracranial hemorrhage, mass, midline shift, or extra-axial fluid collection. The ventricles and sulci are borderline prominent for age which may reflect early cerebral atrophy. Vascular: No hyperdense vessel. Skull: No fracture or suspicious osseous lesion. Sinuses/Orbits: Trace fluid in the maxillary sinuses. Clear mastoid air cells. Unremarkable orbits. Other: None. IMPRESSION: No evidence of acute intracranial abnormality. Electronically Signed   By: Logan Bores M.D.   On: 06/06/2021 11:48            LOS: 4 days    Time spent: 35 min    Emeterio Reeve, DO [facility] 06/10/2021, 9:31 AM   Staff may message me via secure chat in [place]  but this may not receive immediate response,  please page for urgent matters!  If 7PM-7AM, please contact night-coverage [email or web address]  Dictation software was used to generate the above note. Typos may occur and escape review, as with typed/written notes. Please contact Dr Sheppard Coil directly for clarity if needed.

## 2021-06-11 LAB — BASIC METABOLIC PANEL
Anion gap: 7 (ref 5–15)
BUN: 16 mg/dL (ref 6–20)
CO2: 29 mmol/L (ref 22–32)
Calcium: 8.6 mg/dL — ABNORMAL LOW (ref 8.9–10.3)
Chloride: 100 mmol/L (ref 98–111)
Creatinine, Ser: 0.65 mg/dL (ref 0.61–1.24)
GFR, Estimated: >60 mL/min (ref >60)
Glucose, Bld: 131 mg/dL — ABNORMAL HIGH (ref 70–99)
Potassium: 4.1 mmol/L (ref 3.5–5.1)
Sodium: 136 mmol/L (ref 135–145)

## 2021-06-11 LAB — CBC
HCT: 33.2 % — ABNORMAL LOW (ref 39.0–52.0)
Hemoglobin: 9.8 g/dL — ABNORMAL LOW (ref 13.0–17.0)
MCH: 25.5 pg — ABNORMAL LOW (ref 26.0–34.0)
MCHC: 29.5 g/dL — ABNORMAL LOW (ref 30.0–36.0)
MCV: 86.5 fL (ref 80.0–100.0)
Platelets: 250 10*3/uL (ref 150–400)
RBC: 3.84 MIL/uL — ABNORMAL LOW (ref 4.22–5.81)
RDW: 13.9 % (ref 11.5–15.5)
WBC: 5.6 10*3/uL (ref 4.0–10.5)
nRBC: 0 % (ref 0.0–0.2)

## 2021-06-11 LAB — GLUCOSE, CAPILLARY
Glucose-Capillary: 121 mg/dL — ABNORMAL HIGH (ref 70–99)
Glucose-Capillary: 125 mg/dL — ABNORMAL HIGH (ref 70–99)
Glucose-Capillary: 126 mg/dL — ABNORMAL HIGH (ref 70–99)
Glucose-Capillary: 181 mg/dL — ABNORMAL HIGH (ref 70–99)

## 2021-06-11 NOTE — Progress Notes (Signed)
PROGRESS NOTE    John [name].  QIH:[id number] DOB: Jan 26, 1975  DOA: 06/05/2021 Date of Service: 06/11/21 PCP: Cletis Athens, MD     Brief Narrative / Hospital Course:  John Crystal. is a 46 y.o. male with medical history significant for Schizophrenia, OSA noncompliant with BiPAP, hypertension, asthma, gout bilateral knees, hospitalized in August 2022 with sepsis secondary to suspected right septic knee joint but with negative synovial fluid cultures who presents to the ED from his group home with a complaint of left knee pain as well as left arm pain without any reports of injury. ED course and data review: Tmax 101.4 with pulse 117, respirations 22-26.  Labs with WBC of 9 and lactate 2.1.  Hemoglobin 11.8.  Creatinine 1.28 above baseline of 0.6.  Synovial cell count shows intracellular monosodium urate crystals, 73,000 WBCs 96% neutrophils. Chest x-ray, left knee, shoulder, elbow, wrist XR's as well as CT left elbow and left upper extremity venous Doppler.Marland KitchenLUE Venous doppler: Short segment of nonocclusive thrombus within cephalic vein near the elbow joint Left elbow OV:FIEPP hematoma superficial to the olecranon process Bilateral knee DG: Mild osteoarthritis and minimal joint effusion. No acute osseous abnormality. Patient was started on an IV fluid bolus per sepsis protocol and started on ceftriaxone and vancomycin for septic arthritis. 06/06: intracellular monosodium urate crystals on synovial cell count c/w gout, pending protein and Glc. Had episode of lowered responsiveness, pinpoint pupils but episode resolved and pt back to baseline, CT head WNL.  06/07: blood, urine cx neg x2 days, synovial cs neg x1 day. D?c heparin gtt and transitioned to Eliquis. Plan to d/c abx if cultures and AM labs show no concerns.   06/08: L knee synovial cultures negative and effusion less on physical exam. IV abx d/c. Remains asymptomatic tachycardia about same as on arrival to ED, records reviewed and he  is not tachy at baseline per PCP notes. CTA chest  done and negative for PE, it did show enlarged pulmonary artery concerning for pulmonary HTN, also fluid in esophagus risk for aspiration but lungs otherwise appear WNL.  06/09: HR WNL and no concerns on labs, anemia stable no s/s bleeding, d/c tele. Now ambulatory dysfunction and group home declining to take him back until/unless he is more ambulatory - today is Friday, unable to arrange SNF today, will work w/ PT over the weekend and hopefully can revisit group home on Monday vs SNF if not improving  06/10-06/11: stable, planning to work w/ PT to get OOB as much as possible. TOC reached out to group home who will come assess him Monday 06/12 and will determine at that time readmit there or needs SNF  Consultants:  none  Procedures: Synovial fluid drainage L knee in ED     Subjective: Patient reports feeling well today, no concerns      ASSESSMENT & PLAN:   Principal Problem:   SIRS of non-infectious origin w acute organ dysfunction ([place]) Active Problems:   Sepsis, possible([name])   AKI (acute kidney injury) ([place])   Gout   Acute deep vein thrombosis (DVT) of left upper extremity ([place])   Essential hypertension   Hypothyroidism   Paranoid schizophrenia, chronic condition ([place])   Diabetes mellitus ([place])   OSA on CPAP   SIRS (systemic inflammatory response syndrome) ([name])  SIRS of non-infectious origin w acute organ dysfunction ([place]) Acute Gout SIRS resolved as of 06/09/21  Synovial fluid L knee (+)intracellular monosodium urate crystals c/w gout Synovial fluid culture negative x2  days  Colchicine twice daily to continue outpatient and starting allopurinol as well  Holding off on prednisone for now given improvement in knee exam  Consider Ortho follow-up outpatient vs consult here if worsening synovial fluid on knee needing drained Pain control  AKI (acute kidney injury) ([place]) Resolved IV hydration --> po monitor renal  function  Acute deep vein thrombosis (DVT) of left upper extremity ([name]) Short segment of nonocclusive thrombus within cephalic vein near the elbow joint where there is a Small hematoma superficial to the olecranon process, probably from an injury initial anticoagulation for DVT --> heparin gtt D/c heparin 06/07 and transition to po anticoagulation w/ Eliquis, pharmacy has confirmed coverage for Rx  Essential hypertension Controlled on home atenolol   Hypothyroidism TSH was elevated.  Follow-up free T4 was WNL Continue levothyroxine but increased dose 75 to 88 mcg daily  Follow outpatient  Paranoid schizophrenia, chronic condition ([name]) Continue Depakote and Clozaril  Diabetes mellitus ([place]) Sliding scale insulin coverage inpatient --> Resume home medications on discharge   OSA on CPAP Pulmonary HTN likely based on CT chest!  Continue CPAP - pt has hx noncompliance with this here and outpatient, he has been counseled on importance of adherence to this   Sepsis, possible([name]) --> Ruled out Patient with SIRS/sepsis criteria on admission but without definite source of infection Repeat lactic improved Procalcitonin negative Cultured negative Had been on vanc/rocephin --> d/c abx w/o adverse effect   Gout See above Will start allopurinol + continue for up to 6 mos (can stop at 3 mos if uric acid levels WNL)     DVT prophylaxis: on Eliquis to contineu on d/c for DVT Code Status: FULL Family Communication: none at this time Disposition Plan / TOC needs: back to group home vs SNF Barriers to discharge / significant pending items: ambulatory dysfunction and placement is now an issue. TOC reached out to group home who will come assess him Monday 06/12 and will determine at that time readmit there or needs SNF             Objective: Vitals:   06/10/21 0739 06/10/21 2013 06/11/21 0607 06/11/21 0740  BP: 128/86 131/77 126/87 (!) 122/93  Pulse: 99 95 99 99  Resp: '20 18  16 18  '$ Temp: 98.4 F (36.9 C) 98.3 F (36.8 C) (!) 97.4 F (36.3 C) 98.4 F (36.9 C)  TempSrc:  Oral Oral Oral  SpO2: 92% 95% 93% 92%  Weight:      Height:        Intake/Output Summary (Last 24 hours) at 06/11/2021 1309 Last data filed at 06/11/2021 1130 Gross per 24 hour  Intake 240 ml  Output 1250 ml  Net -1010 ml    Filed Weights   06/05/21 2108  Weight: 132.9 kg    Examination: Constitutional:  VS as above General Appearance: resting in bed, NAD Ears, Nose, Mouth, Throat: Normal appearance Neck: No masses, trachea midline Respiratory: Normal respiratory effort Breath sounds normal, no wheeze/rhonchi/rales Cardiovascular: S1/S2 normal, no murmur/rub/gallop auscultated No lower extremity edema Neurological: No cranial nerve deficit on limited exam        Scheduled Medications:   allopurinol  100 mg Oral Daily   apixaban  5 mg Oral BID   asenapine  10 mg Sublingual Daily   atenolol  100 mg Oral Daily   atorvastatin  10 mg Oral Daily   cloZAPine  150 mg Oral QHS   colchicine  0.6 mg Oral BID   divalproex  2,000 mg Oral QHS   insulin aspart  0-20 Units Subcutaneous TID WC   insulin aspart  0-5 Units Subcutaneous QHS   levothyroxine  75 mcg Oral Q0600   oxybutynin  5 mg Oral Daily   pantoprazole  40 mg Oral Daily    Continuous Infusions:    PRN Medications:  acetaminophen **OR** acetaminophen, albuterol, benzonatate, ondansetron **OR** ondansetron (ZOFRAN) IV  Antimicrobials:  Anti-infectives (From admission, onward)    Start     Dose/Rate Route Frequency Ordered Stop   06/07/21 1400  vancomycin (VANCOREADY) IVPB 2000 mg/400 mL  Status:  Discontinued        2,000 mg 200 mL/hr over 120 Minutes Intravenous Every 12 hours 06/07/21 1002 06/08/21 0922   06/06/21 2300  cefTRIAXone (ROCEPHIN) 1 g in sodium chloride 0.9 % 100 mL IVPB  Status:  Discontinued        1 g 200 mL/hr over 30 Minutes Intravenous Every 24 hours 06/06/21 0520 06/06/21 0725    06/06/21 1600  vancomycin (VANCOREADY) IVPB 1250 mg/250 mL  Status:  Discontinued        1,250 mg 166.7 mL/hr over 90 Minutes Intravenous Every 12 hours 06/06/21 0635 06/07/21 1002   06/06/21 0200  vancomycin (VANCOCIN) IVPB 1000 mg/200 mL premix       See Hyperspace for full Linked Orders Report.   1,000 mg 200 mL/hr over 60 Minutes Intravenous  Once 06/06/21 0153 06/06/21 0450   06/06/21 0200  vancomycin (VANCOREADY) IVPB 1500 mg/300 mL       See Hyperspace for full Linked Orders Report.   1,500 mg 150 mL/hr over 120 Minutes Intravenous  Once 06/06/21 0153 06/06/21 0907   06/05/21 2215  cefTRIAXone (ROCEPHIN) 2 g in sodium chloride 0.9 % 100 mL IVPB  Status:  Discontinued        2 g 200 mL/hr over 30 Minutes Intravenous Every 24 hours 06/05/21 2200 06/08/21 9379       Data Reviewed: I have personally reviewed following labs and imaging studies  CBC: Recent Labs  Lab 06/06/21 0121 06/07/21 0550 06/08/21 0605 06/09/21 0608 06/11/21 0625  WBC 9.1 5.9 6.0 7.0 5.6  NEUTROABS 5.5  --   --   --   --   HGB 10.2* 10.4* 10.8* 10.5* 9.8*  HCT 34.0* 34.4* 35.8* 34.8* 33.2*  MCV 86.5 85.1 86.3 85.5 86.5  PLT 168 175 208 246 024    Basic Metabolic Panel: Recent Labs  Lab 06/05/21 2120 06/05/21 2309 06/07/21 0550 06/08/21 0605 06/09/21 0608 06/11/21 0625  NA 133*  --  137 137 137 136  K 4.0  --  4.1 4.4 4.4 4.1  CL 98  --  102 103 103 100  CO2 26  --  '28 28 28 29  '$ GLUCOSE 158*  --  146* 149* 135* 131*  BUN 22*  --  '11 12 19 16  '$ CREATININE 1.28*  --  0.62 0.67 0.78 0.65  CALCIUM 8.8*  --  8.5* 8.5* 8.6* 8.6*  MG  --  1.6* 2.0  --   --   --     GFR: Estimated Creatinine Clearance: 170.8 mL/min (by C-G formula based on SCr of 0.65 mg/dL). Liver Function Tests: Recent Labs  Lab 06/05/21 2120  AST 13*  ALT 9  ALKPHOS 42  BILITOT 0.6  PROT 8.3*  ALBUMIN 3.3*    Recent Labs  Lab 06/05/21 2120  LIPASE 24    No results for input(s): "AMMONIA" in the last 168  hours. Coagulation Profile: Recent Labs  Lab 06/06/21 0625  INR 1.3*    Cardiac Enzymes: Recent Labs  Lab 06/05/21 2309  CKTOTAL 129    BNP (last 3 results) No results for input(s): "PROBNP" in the last 8760 hours. HbA1C: No results for input(s): "HGBA1C" in the last 72 hours.  CBG: Recent Labs  Lab 06/10/21 1131 06/10/21 1619 06/10/21 2013 06/11/21 0741 06/11/21 1121  GLUCAP 179* 245* 184* 121* 125*    Lipid Profile: No results for input(s): "CHOL", "HDL", "LDLCALC", "TRIG", "CHOLHDL", "LDLDIRECT" in the last 72 hours. Thyroid Function Tests: No results for input(s): "TSH", "T4TOTAL", "FREET4", "T3FREE", "THYROIDAB" in the last 72 hours.  Anemia Panel: No results for input(s): "VITAMINB12", "FOLATE", "FERRITIN", "TIBC", "IRON", "RETICCTPCT" in the last 72 hours. Urine analysis:    Component Value Date/Time   COLORURINE YELLOW (A) 06/06/2021 0121   APPEARANCEUR CLEAR (A) 06/06/2021 0121   APPEARANCEUR Clear 04/09/2014 1215   LABSPEC 1.009 06/06/2021 0121   LABSPEC 1.015 04/09/2014 1215   PHURINE 5.0 06/06/2021 0121   GLUCOSEU NEGATIVE 06/06/2021 0121   GLUCOSEU Negative 04/09/2014 1215   HGBUR NEGATIVE 06/06/2021 0121   BILIRUBINUR NEGATIVE 06/06/2021 0121   BILIRUBINUR Negative 04/09/2014 1215   KETONESUR NEGATIVE 06/06/2021 0121   PROTEINUR NEGATIVE 06/06/2021 0121   UROBILINOGEN 1.0 10/23/2011 1845   NITRITE NEGATIVE 06/06/2021 0121   LEUKOCYTESUR NEGATIVE 06/06/2021 0121   LEUKOCYTESUR Negative 04/09/2014 1215   Sepsis Labs: '@LABRCNTIP'$ (procalcitonin:4,lacticidven:4)  Recent Results (from the past 240 hour(s))  Blood Culture (routine x 2)     Status: None   Collection Time: 06/05/21 10:38 PM   Specimen: BLOOD  Result Value Ref Range Status   Specimen Description BLOOD RIGHT HAND  Final   Special Requests   Final    BOTTLES DRAWN AEROBIC AND ANAEROBIC Blood Culture adequate volume   Culture   Final    NO GROWTH 5 DAYS Performed at [facility], [address] [place], [place] [address]    Report Status 06/10/2021 FINAL  Final  Blood Culture (routine x 2)     Status: None   Collection Time: 06/05/21 10:45 PM   Specimen: BLOOD  Result Value Ref Range Status   Specimen Description BLOOD RIGHT ASSIST CONTROL  Final   Special Requests   Final    BOTTLES DRAWN AEROBIC AND ANAEROBIC Blood Culture adequate volume   Culture   Final    NO GROWTH 5 DAYS Performed at [facility], [address] [place], [place] [address]    Report Status 06/10/2021 FINAL  Final  SARS Coronavirus 2 by RT PCR (hospital order, performed in [facility] hospital lab) *cepheid single result test* Anterior Nasal Swab     Status: None   Collection Time: 06/05/21 11:38 PM   Specimen: Anterior Nasal Swab  Result Value Ref Range Status   SARS Coronavirus 2 by RT PCR NEGATIVE NEGATIVE Final    Comment: (NOTE) SARS-CoV-2 target nucleic acids are NOT DETECTED.  The SARS-CoV-2 RNA is generally detectable in upper and lower respiratory specimens during the acute phase of infection. The lowest concentration of SARS-CoV-2 viral copies this assay can detect is 250 copies / mL. A negative result does not preclude SARS-CoV-2 infection and should not be used as the sole basis for treatment or other patient management decisions.  A negative result may occur with improper specimen collection / handling, submission of specimen other than nasopharyngeal swab, presence of viral mutation(s) within the areas targeted by this assay,  and inadequate number of viral copies (<250 copies / mL). A negative result must be combined with clinical observations, patient history, and epidemiological information.  Fact Sheet for Patients:   https://www.patel.info/  Fact Sheet for Healthcare Providers: https://hall.com/  This test is not yet approved or  cleared by the [place] FDA and has been authorized for  detection and/or diagnosis of SARS-CoV-2 by FDA under an Emergency Use Authorization (EUA).  This EUA will remain in effect (meaning this test can be used) for the duration of the COVID-19 declaration under Section 564(b)(1) of the Act, 21 U.S.C. section 360bbb-3(b)(1), unless the authorization is terminated or revoked sooner.  Performed at [facility], [address] [place], [place] [address]   Urine Culture     Status: None   Collection Time: 06/06/21  1:21 AM   Specimen: In/Out Cath Urine  Result Value Ref Range Status   Specimen Description   Final    IN/OUT CATH URINE Performed at [facility], [address] [place], [place] [address]    Special Requests   Final    NONE Performed at [facility], [address] [place], [place] [address]    Culture   Final    NO GROWTH Performed at [facility], [place] [address], [place], [place] [address]    Report Status 06/07/2021 FINAL  Final  Body fluid culture w Gram Stain     Status: None   Collection Time: 06/06/21  1:21 AM   Specimen: KNEE; Body Fluid  Result Value Ref Range Status   Specimen Description   Final    KNEE LEFT Performed at [facility], [address], [place], [place] [address]    Special Requests   Final    NONE Performed at [facility], [address] [place], [place] [address]    Gram Stain   Final    NO SQUAMOUS EPITHELIAL CELLS SEEN ABUNDANT WBC PRESENT, PREDOMINANTLY PMN NO ORGANISMS SEEN    Culture   Final    NO GROWTH 3 DAYS Performed at [facility], [place] [address], [place], [place] [address]    Report Status 06/09/2021 FINAL  Final         Radiology Studies last 96 hours: CT Angio Chest Pulmonary Embolism (PE) W or WO Contrast  Result Date: 06/08/2021 CLINICAL DATA:  PE suspected, shortness of breath EXAM: CT ANGIOGRAPHY CHEST WITH CONTRAST TECHNIQUE: Multidetector CT imaging of the chest was performed using the standard  protocol during bolus administration of intravenous contrast. Multiplanar CT image reconstructions and MIPs were obtained to evaluate the vascular anatomy. RADIATION DOSE REDUCTION: This exam was performed according to the departmental dose-optimization program which includes automated exposure control, adjustment of the mA and/or kV according to patient size and/or use of iterative reconstruction technique. CONTRAST:  52m OMNIPAQUE IOHEXOL 350 MG/ML SOLN COMPARISON:  11/30/2019 CTA chest FINDINGS: Cardiovascular: Satisfactory opacification of the pulmonary arteries to the segmental level. No evidence of pulmonary embolism. The main pulmonary artery is mildly enlarged. Three-vessel arch with a common origin of the right and left common carotid arteries, and an aberrant origin of the right subclavian artery. Normal heart size. No pericardial effusion. Mediastinum/Nodes: Redemonstrated nodular soft tissue within the anterior mediastinum, unchanged compared to 11/30/2019, possibly residual thymic tissue. Imaged portion of the thyroid is unremarkable. No axillary, mediastinal, or hilar lymphadenopathy. Fluid in the esophagus, which increases the risk of aspiration. Small hiatal hernia. The trachea is unremarkable. Lungs/Pleura:  Small right and trace left pleural effusions with associated atelectasis. No additional focal pulmonary opacity. No pneumothorax. Upper Abdomen: No acute finding. Musculoskeletal: No acute osseous abnormality. Review of the MIP images confirms the above findings. IMPRESSION: 1. Negative for pulmonary embolism. Enlargement of the main pulmonary artery, which can be seen in the setting of pulmonary hypertension. 2. Small right and trace left pleural effusions. 3. Fluid in the esophagus, which increases the risk of aspiration. Electronically Signed   By: Merilyn Baba M.D.   On: 06/08/2021 13:14            LOS: 5 days    Time spent: 25 min    Emeterio Reeve, DO [facility] 06/11/2021, 1:09 PM   Staff may message me via secure chat in [place]  but this may not receive immediate response,  please page for urgent matters!  If 7PM-7AM, please contact night-coverage [email or web address]  Dictation software was used to generate the above note. Typos may occur and escape review, as with typed/written notes. Please contact Dr Sheppard Coil directly for clarity if needed.

## 2021-06-11 NOTE — Progress Notes (Addendum)
2007: Pt instructed to call RN when he is ready to go to sleep so RRT can place BiPAP on him. Pt has been instructed to do so for the last couple nights and has failed to do so. RRT asked pt if he just did not want to wear it and pt stated he did want to wear BiPAP, but had forgotten to call the last couple nights. RN instructed to call RRT when pt is ready to sleep. RN is aware of situation.   2300: When RRT went to check on pt, he refused BiPAP.

## 2021-06-11 NOTE — Progress Notes (Signed)
Physical Therapy Treatment Patient Details Name: John [name]. MRN: [id number] DOB: 30-Aug-1975 Today's Date: 06/11/2021   History of Present Illness 46 y.o. male with medical history significant for Schizophrenia, OSA noncompliant with BiPAP, hypertension, asthma, gout bilateral knees, hospitalized in August 2022 with sepsis secondary to suspected right septic knee joint but with negative synovial fluid cultures who presents to the ED from his group home with a complaint of left knee pain as well as left arm pain without any reports of injury.  Noted with non-occlusive DVT to L UE (on anticoag) and gout with SIRS to L knee.    PT Comments    Attempted to see pt ~11:00, he was sleeping and when PT woke him he asked if he could eat his breakfast first.  Returned ~25 minutes later and he was done eating and ready to go for a walk and work with PT.  Similar to yesterday he needed almost constant and repeated cues to motivate/initiate movement.  Despite reminders for the timed sit-to-stand pt with no urgency in getting back up quickly and needed repeated, simple cuing and direct encouragement to maintain the effort; 5:11 seconds for 5STS this date.  Pt able to do some ambulation w/o AD, but starting the effort he was stiff and c/o weakness and we did use the walker for the first 170f again today.  Unsure of his actual baseline, but it appears he is likely close to it and simply needs plenty of consistent reinforcement to motivate some activity.  Most appropriate d/c plan continues to be HHPT at his group home.  Recommendations for follow up therapy are one component of a multi-disciplinary discharge planning process, led by the attending physician.  Recommendations may be updated based on patient status, additional functional criteria and insurance authorization.  Follow Up Recommendations  Home health PT (return to group home per continued progress)     Assistance Recommended at Discharge  Intermittent Supervision/Assistance  Patient can return home with the following A little help with walking and/or transfers;A little help with bathing/dressing/bathroom;Assistance with cooking/housework;Assist for transportation;Direct supervision/assist for medications management   Equipment Recommendations  Rolling walker (2 wheels)    Recommendations for Other Services       Precautions / Restrictions Precautions Precautions: Fall (moderate) Restrictions Weight Bearing Restrictions: No     Mobility  Bed Mobility               General bed mobility comments: up in recliner    Transfers Overall transfer level: Needs assistance Equipment used: Rolling walker (2 wheels) Transfers: Sit to/from Stand Sit to Stand: Min guard, Supervision           General transfer comment: Pt again taking extra time to organize his thoughts and motivate  getting up to standing from rcliner.  Heavy and consistent cuing/encouragement t/o.  He was able to rise 7 times times w/o direct assist from recliner but needed to reset and collect his himself before each effort despite encouragement to try and have a little more urgency/functionality to his transfers (aka repeated cues about 5x sit to stand FOR TIME...)    Ambulation/Gait Ambulation/Gait assistance: Min assist, +2 safety/equipment Gait Distance (Feet): 200 Feet Assistive device: Rolling walker (2 wheels), 1 person hand held assist, None         General Gait Details: similar to previuos session: ~100 ft with walker, ~50 ft with handrail and ~50 ft w/o UE support.  Pt able to maintain slow, but safe cadence and  reports only minimal fatigue with the effort (though unwilling to attempt another loop - despite encouragement - on return to room, deferred when encouraged to do more walking at end of session as well).  Pt with no LOBs, some lateral lean/shuffle but did not need any direct assist for safety or support apart from consistent verbal  cuing and encouragement   Stairs             Wheelchair Mobility    Modified Rankin (Stroke Patients Only)       Balance Overall balance assessment: Needs assistance Sitting-balance support: No upper extremity supported, Feet supported Sitting balance-Leahy Scale: Good     Standing balance support: Bilateral upper extremity supported, No upper extremity supported Standing balance-Leahy Scale: Fair Standing balance comment: good balance with walker, no LOBs w/o it though slightly more guarded and with lateral lean to WBing side                            Cognition Arousal/Alertness: Lethargic Behavior During Therapy: Flat affect Overall Cognitive Status: Within Functional Limits for tasks assessed                                          Exercises Other Exercises Other Exercises: Sit/stand x 8 (with RW) from recliner, emphasis on mechanics, anterior weight translation and UE use.  Able to rise each time slowly, laborously, but w/o phyiscal assist. Poor eccentric control with return sit.  5x STS = 5: 11 despite reminders that this is timed and encouragement to try reset and rise quickly with each effort. Extensive discussions and education regarding need to be up and increase activity her and at group home, mechanics and strategies for mobility.    General Comments General comments (skin integrity, edema, etc.): Pt needed consistent encouragement and reinforcement t/o the session.  Difficulty initiating movement, needing repeated simple cuing to engage in any activity      Pertinent Vitals/Pain Pain Assessment Pain Assessment: No/denies pain    Home Living                          Prior Function            PT Goals (current goals can now be found in the care plan section) Progress towards PT goals: Progressing toward goals    Frequency    Min 2X/week      PT Plan Current plan remains appropriate    Co-evaluation               AM-PAC PT "6 Clicks" Mobility   Outcome Measure  Help needed turning from your back to your side while in a flat bed without using bedrails?: None Help needed moving from lying on your back to sitting on the side of a flat bed without using bedrails?: None Help needed moving to and from a bed to a chair (including a wheelchair)?: A Little Help needed standing up from a chair using your arms (e.g., wheelchair or bedside chair)?: A Little Help needed to walk in hospital room?: A Little Help needed climbing 3-5 steps with a railing? : A Little 6 Click Score: 20    End of Session Equipment Utilized During Treatment: Gait belt Activity Tolerance: Patient tolerated treatment well Patient left: with call bell/phone within reach;with chair alarm  set Nurse Communication: Mobility status PT Visit Diagnosis: Muscle weakness (generalized) (M62.81);Difficulty in walking, not elsewhere classified (R26.2)     Time: 1125-1150 PT Time Calculation (min) (ACUTE ONLY): 25 min  Charges:  $Gait Training: 8-22 mins $Therapeutic Activity: 8-22 mins                     Kreg Shropshire, DPT 06/11/2021, 1:39 PM

## 2021-06-11 NOTE — TOC Progression Note (Signed)
Transition of Care (TOC) - Progression Note    Patient Details  Name: John [name]. MRN: [id number] Date of Birth: Oct 10, 1975  Transition of Care [facility] CM/SW Contact  John Price, RN Phone Number: 06/11/2021, 12:32 PM  Clinical Narrative: 6/11:  I called the [facility] facility and spoke with manger/director John [name] at [phone] on request for a call back. John John [name] indicated the plan discussed on Friday with CSW was to let him have the weekend to get more therapy, pending bed offers, further progress, and he was/will come Monday to do an assessment and speak to patient directly. He has tried to contact patient to no avail. He stated will meet with weekday CM for this assessment, and then decide if he is ready to go back to group home or proceed with STR/SNF placement and make sure patient understands the difference. Updated provider. Only bed offer so far is from John [name], John [name] informed.  Updated provider.  John Davies RN CM     Expected Discharge Plan: Group Home Barriers to Discharge: Continued Medical Work up  Expected Discharge Plan and Services Expected Discharge Plan: Group Home       Living arrangements for the past 2 months: Group Home                                       Social Determinants of Health (SDOH) Interventions    Readmission Risk Interventions     No data to display

## 2021-06-12 ENCOUNTER — Ambulatory Visit: Payer: 59 | Admitting: Internal Medicine

## 2021-06-12 LAB — GLUCOSE, CAPILLARY
Glucose-Capillary: 124 mg/dL — ABNORMAL HIGH (ref 70–99)
Glucose-Capillary: 189 mg/dL — ABNORMAL HIGH (ref 70–99)

## 2021-06-12 MED ORDER — COLCHICINE 0.6 MG PO TABS: 0.6000 mg | ORAL_TABLET | Freq: Two times a day (BID) | ORAL | 0 refills | Status: DC

## 2021-06-12 MED ORDER — APIXABAN 5 MG PO TABS: 5.0000 mg | ORAL_TABLET | Freq: Two times a day (BID) | ORAL | 0 refills | Status: DC

## 2021-06-12 MED ORDER — ALLOPURINOL 100 MG PO TABS: 100.0000 mg | ORAL_TABLET | Freq: Every day | ORAL | 0 refills | Status: AC

## 2021-06-12 MED ORDER — LEVOTHYROXINE SODIUM 88 MCG PO TABS: 88.0000 ug | ORAL_TABLET | Freq: Every day | ORAL | 0 refills | Status: DC

## 2021-06-12 NOTE — Discharge Summary (Signed)
Physician Discharge Summary   Patient: John [name]. MRN: [id number]  DOB: 01/26/1975   Admit:     Date of Admission: 06/05/2021 Admitted from: group home    Discharge: Date of discharge: 06/12/21 Disposition: Group home Condition at discharge: good  CODE STATUS: FULL   Diet recommendation: Cardiac and Carb modified diet   Discharge Physician: Emeterio Reeve, DO [facility]     PCP: Cletis Athens, MD  Recommendations for Outpatient Follow-up:  Follow up with PCP Cletis Athens, MD in 1-2 weeks Please obtain labs/tests:  CBC monitor anemia BNP/CK/Uric acid to monitor renal fxn, monitor for myopathy on colchicine plus statin monitor allopurinol effectiveness may need to increase dose  Please follow up on the following pending results: none Consider outpatient referral to ortho/rheumatology for recurrent gout problems   Brief Narrative / Hospital Course:  John [name]. is a 46 y.o. male with medical history significant for Schizophrenia, OSA noncompliant with BiPAP, hypertension, asthma, gout bilateral knees, hospitalized in August 2022 with sepsis secondary to suspected right septic knee joint but with negative synovial fluid cultures who presents to the ED from his group home with a complaint of left knee pain as well as left arm pain without any reports of injury. ED course and data review: Tmax 101.4 with pulse 117, respirations 22-26.  Labs with WBC of 9 and lactate 2.1.  Hemoglobin 11.8.  Creatinine 1.28 above baseline of 0.6.  Synovial cell count shows intracellular monosodium urate crystals, 73,000 WBCs 96% neutrophils. Chest x-ray, left knee, shoulder, elbow, wrist XR's as well as CT left elbow and left upper extremity venous Doppler.Marland KitchenLUE Venous doppler: Short segment of nonocclusive thrombus within cephalic vein near the elbow joint Left elbow NL:[id number] hematoma superficial to the olecranon process Bilateral knee DG: Mild osteoarthritis and minimal joint  effusion. No acute osseous abnormality. Patient was started on an IV fluid bolus per sepsis protocol and started on ceftriaxone and vancomycin for septic arthritis. 06/06: intracellular monosodium urate crystals on synovial cell count c/w gout, pending protein and Glc. Had episode of lowered responsiveness, pinpoint pupils but episode resolved and pt back to baseline, CT head WNL.  06/07: blood, urine cx neg x2 days, synovial cs neg x1 day. D?c heparin gtt and transitioned to Eliquis. Plan to d/c abx if cultures and AM labs show no concerns.   06/08: L knee synovial cultures negative and effusion less on physical exam. IV abx d/c. Remains asymptomatic tachycardia about same as on arrival to ED, records reviewed and he is not tachy at baseline per PCP notes. CTA chest  done and negative for PE, it did show enlarged pulmonary artery concerning for pulmonary HTN, also fluid in esophagus risk for aspiration but lungs otherwise appear WNL.  06/09: HR WNL and no concerns on labs, anemia stable no s/s bleeding, d/c tele. Now ambulatory dysfunction and group home declining to take him back until/unless he is more ambulatory - today is Friday, unable to arrange SNF today, will work w/ PT over the weekend and hopefully can revisit group home on Monday vs SNF if not improving  06/10-06/11: stable, planning to work w/ PT to get OOB as much as possible. TOC reached out to group home who will come assess him Monday 06/12 and will determine at that time readmit there or needs SNF 06/12: cleared for readmission to group home    Consultants:  none   Procedures: Synovial fluid drainage L knee in ED  Discharge Diagnoses: Principal Problem:   SIRS of non-infectious origin w acute organ dysfunction ([place]) Active Problems:   Sepsis, possible([name])   AKI (acute kidney injury) ([place])   Gout   Acute deep vein thrombosis (DVT) of left upper extremity ([place])   Essential hypertension   Hypothyroidism   Paranoid  schizophrenia, chronic condition ([name])   Diabetes mellitus ([place])   OSA on CPAP   SIRS (systemic inflammatory response syndrome) ([name])    Assessment & Plan:  SIRS of non-infectious origin w acute organ dysfunction ([name]) Acute Gout SIRS resolved as of 06/09/21  Synovial fluid L knee (+)intracellular monosodium urate crystals c/w gout Synovial fluid culture negative x2 days  Colchicine twice daily to continue outpatient and starting allopurinol as well  Holding off on prednisone given improvement in knee exam  Consider Ortho/Rheum follow-up outpatient  Pain control   AKI (acute kidney injury) ([place]) Resolved IV hydration --> po monitor renal function   Acute deep vein thrombosis (DVT) of left upper extremity ([name]) Short segment of nonocclusive thrombus within cephalic vein near the elbow joint where there is a Small hematoma superficial to the olecranon process, probably from an injury initial anticoagulation for DVT --> heparin gtt D/c heparin 06/07 and transition to po anticoagulation w/ Eliquis, pharmacy has confirmed coverage for Rx   Essential hypertension Tachycrdia Controlled on home atenolol    Hypothyroidism TSH was elevated.  Follow-up free T4 was WNL Continue levothyroxine but increased dose 75 to 88 mcg daily  Follow outpatient   Paranoid schizophrenia, chronic condition ([name]) Continue Depakote and Clozaril   Diabetes mellitus ([place]) Sliding scale insulin coverage inpatient --> Resume home medications on discharge    OSA on CPAP Pulmonary HTN likely based on CT chest!  Continue CPAP  pt has hx noncompliance with this here and outpatient, he has been counseled on importance of adherence to this    Sepsis, possible([name]) --> Ruled out Patient with SIRS/sepsis criteria on admission but without definite source of infection Repeat lactic improved Procalcitonin negative Cultured negative Had been on vanc/rocephin --> d/c abx w/o adverse effect    Gout See  above Will start allopurinol + continue for up to 6 mos (can stop at 3 mos if uric acid levels WNL)       Discharge Instructions  Allergies as of 06/12/2021   No Known Allergies      Medication List     TAKE these medications    Accu-Chek Guide test strip Generic drug: glucose blood USE TO CHECK BLOOD SUGAR DAILY   Accu-Chek Softclix Lancets lancets USE TO CHECK BLOOD SUGAR DAILY   albuterol (2.5 MG/3ML) 0.083% nebulizer solution Commonly known as: PROVENTIL USE 1 VIAL VIA NEBULIZER 3 TIMES A DAY   allopurinol 100 MG tablet Commonly known as: ZYLOPRIM Take 1 tablet (100 mg total) by mouth daily. Start taking on: June 13, 2021   apixaban 5 MG Tabs tablet Commonly known as: ELIQUIS Take 1 tablet (5 mg total) by mouth 2 (two) times daily.   Asenapine Maleate 10 MG Subl Place 1 tablet under the tongue daily.   atenolol 100 MG tablet Commonly known as: TENORMIN TAKE 1 TABLET BY MOUTH DAILY   atorvastatin 10 MG tablet Commonly known as: LIPITOR TAKE 1 TABLET BY MOUTH DAILY   B-D SINGLE USE SWABS REGULAR Pads USE AS DIRECTED FOR BLOOD SUGAR CHECKS.   cloZAPine 100 MG tablet Commonly known as: CLOZARIL Take 150 mg by mouth at bedtime.   colchicine 0.6 MG tablet Take  1 tablet (0.6 mg total) by mouth 2 (two) times daily.   divalproex 500 MG 24 hr tablet Commonly known as: DEPAKOTE ER Take 2,000 mg by mouth at bedtime.   levothyroxine 88 MCG tablet Commonly known as: SYNTHROID Take 1 tablet (88 mcg total) by mouth daily before breakfast. What changed:  medication strength See the new instructions.   metFORMIN 1000 MG tablet Commonly known as: GLUCOPHAGE TAKE 1 TABLET BY MOUTH TWICE A DAY WITH FOOD   omeprazole 20 MG capsule Commonly known as: PRILOSEC TAKE 1 CAPSULE BY MOUTH DAILY   oxybutynin 5 MG tablet Commonly known as: DITROPAN Take 5 mg by mouth daily.        Diet Orders (From admission, onward)     Start     Ordered   06/06/21 0349   Diet heart healthy/carb modified Room service appropriate? Yes; Fluid consistency: Thin  Diet effective now       Question Answer Comment  Diet-HS Snack? Nothing   Room service appropriate? Yes   Fluid consistency: Thin      06/06/21 0351               No Known Allergies   Subjective: patient seen this morning, reports pain is controlled but he feels sore from walking a lot yesterday.    Discharge Exam: Vitals:   06/12/21 0549 06/12/21 0727  BP: 115/79 119/83  Pulse: 83 83  Resp: 18 18  Temp: 98.8 F (37.1 C) 98 F (36.7 C)  SpO2: 99% 98%   Vitals:   06/11/21 1955 06/12/21 0344 06/12/21 0549 06/12/21 0727  BP: 112/75  115/79 119/83  Pulse: 95  83 83  Resp: '18 16 18 18  '$ Temp: 98.2 F (36.8 C)  98.8 F (37.1 C) 98 F (36.7 C)  TempSrc:      SpO2: 93% 94% 99% 98%  Weight:      Height:        General: Pt is alert, awake, not in acute distress Cardiovascular: RRR, S1/S2 +, no rubs, no gallops Respiratory: CTA bilaterally, no wheezing, no rhonchi Abdominal: Soft, NT, ND, bowel sounds + Extremities: no edema, no cyanosis     The results of significant diagnostics from this hospitalization (including imaging, microbiology, ancillary and laboratory) are listed below for reference.     Microbiology: Recent Results (from the past 240 hour(s))  Blood Culture (routine x 2)     Status: None   Collection Time: 06/05/21 10:38 PM   Specimen: BLOOD  Result Value Ref Range Status   Specimen Description BLOOD RIGHT HAND  Final   Special Requests   Final    BOTTLES DRAWN AEROBIC AND ANAEROBIC Blood Culture adequate volume   Culture   Final    NO GROWTH 5 DAYS Performed at [facility], [address], [place], [place] [address]    Report Status 06/10/2021 FINAL  Final  Blood Culture (routine x 2)     Status: None   Collection Time: 06/05/21 10:45 PM   Specimen: BLOOD  Result Value Ref Range Status   Specimen Description BLOOD RIGHT ASSIST CONTROL   Final   Special Requests   Final    BOTTLES DRAWN AEROBIC AND ANAEROBIC Blood Culture adequate volume   Culture   Final    NO GROWTH 5 DAYS Performed at [facility], [address] [place], [place] [address]    Report Status 06/10/2021 FINAL  Final  SARS Coronavirus 2 by RT PCR (hospital order, performed  in [facility] lab) *cepheid single result test* Anterior Nasal Swab     Status: None   Collection Time: 06/05/21 11:38 PM   Specimen: Anterior Nasal Swab  Result Value Ref Range Status   SARS Coronavirus 2 by RT PCR NEGATIVE NEGATIVE Final    Comment: (NOTE) SARS-CoV-2 target nucleic acids are NOT DETECTED.  The SARS-CoV-2 RNA is generally detectable in upper and lower respiratory specimens during the acute phase of infection. The lowest concentration of SARS-CoV-2 viral copies this assay can detect is 250 copies / mL. A negative result does not preclude SARS-CoV-2 infection and should not be used as the sole basis for treatment or other patient management decisions.  A negative result may occur with improper specimen collection / handling, submission of specimen other than nasopharyngeal swab, presence of viral mutation(s) within the areas targeted by this assay, and inadequate number of viral copies (<250 copies / mL). A negative result must be combined with clinical observations, patient history, and epidemiological information.  Fact Sheet for Patients:   https://www.patel.info/  Fact Sheet for Healthcare Providers: https://hall.com/  This test is not yet approved or  cleared by the [place] FDA and has been authorized for detection and/or diagnosis of SARS-CoV-2 by FDA under an Emergency Use Authorization (EUA).  This EUA will remain in effect (meaning this test can be used) for the duration of the COVID-19 declaration under Section 564(b)(1) of the Act, 21 U.S.C. section 360bbb-3(b)(1), unless the  authorization is terminated or revoked sooner.  Performed at [facility], [address] [place], [place] [address]   Urine Culture     Status: None   Collection Time: 06/06/21  1:21 AM   Specimen: In/Out Cath Urine  Result Value Ref Range Status   Specimen Description   Final    IN/OUT CATH URINE Performed at [facility], [address], [place], [place] [address]    Special Requests   Final    NONE Performed at [facility], [address], [place], [place] [address]    Culture   Final    NO GROWTH Performed at [facility], [place] [address], [place], [place] [address]    Report Status 06/07/2021 FINAL  Final  Body fluid culture w Gram Stain     Status: None   Collection Time: 06/06/21  1:21 AM   Specimen: KNEE; Body Fluid  Result Value Ref Range Status   Specimen Description   Final    KNEE LEFT Performed at [facility], [address] [place], [place] [address]    Special Requests   Final    NONE Performed at [facility], [address] [place], [place] [address]    Gram Stain   Final    NO SQUAMOUS EPITHELIAL CELLS SEEN ABUNDANT WBC PRESENT, PREDOMINANTLY PMN NO ORGANISMS SEEN    Culture   Final    NO GROWTH 3 DAYS Performed at [facility], [place] [address], [place],  [address]    Report Status 06/09/2021 FINAL  Final     Labs: BNP (last 3 results) No results for input(s): "BNP" in the last 8760 hours. Basic Metabolic Panel: Recent Labs  Lab 06/05/21 2120 06/05/21 2309 06/07/21 0550 06/08/21 0605 06/09/21 0608 06/11/21 0625  NA 133*  --  137 137 137 136  K 4.0  --  4.1 4.4 4.4 4.1  CL 98  --  102 103 103 100  CO2 26  --  $'28 28 28 29  'n$ GLUCOSE 158*  --  146* 149* 135* 131*  BUN 22*  --  '11 12 19 16  '$ CREATININE 1.28*  --  0.62 0.67 0.78 0.65  CALCIUM 8.8*  --  8.5* 8.5* 8.6* 8.6*  MG  --  1.6* 2.0  --   --   --    Liver Function Tests: Recent Labs  Lab 06/05/21 2120  AST  13*  ALT 9  ALKPHOS 42  BILITOT 0.6  PROT 8.3*  ALBUMIN 3.3*   Recent Labs  Lab 06/05/21 2120  LIPASE 24   No results for input(s): "AMMONIA" in the last 168 hours. CBC: Recent Labs  Lab 06/06/21 0121 06/07/21 0550 06/08/21 0605 06/09/21 0608 06/11/21 0625  WBC 9.1 5.9 6.0 7.0 5.6  NEUTROABS 5.5  --   --   --   --   HGB 10.2* 10.4* 10.8* 10.5* 9.8*  HCT 34.0* 34.4* 35.8* 34.8* 33.2*  MCV 86.5 85.1 86.3 85.5 86.5  PLT 168 175 208 246 250   Cardiac Enzymes: Recent Labs  Lab 06/05/21 2309  CKTOTAL 129   BNP: Invalid input(s): "POCBNP" CBG: Recent Labs  Lab 06/11/21 1121 06/11/21 1646 06/11/21 2148 06/12/21 0733 06/12/21 1114  GLUCAP 125* 126* 181* 124* 189*   D-Dimer No results for input(s): "DDIMER" in the last 72 hours. Hgb A1c No results for input(s): "HGBA1C" in the last 72 hours. Lipid Profile No results for input(s): "CHOL", "HDL", "LDLCALC", "TRIG", "CHOLHDL", "LDLDIRECT" in the last 72 hours. Thyroid function studies No results for input(s): "TSH", "T4TOTAL", "T3FREE", "THYROIDAB" in the last 72 hours.  Invalid input(s): "FREET3" Anemia work up No results for input(s): "VITAMINB12", "FOLATE", "FERRITIN", "TIBC", "IRON", "RETICCTPCT" in the last 72 hours. Urinalysis    Component Value Date/Time   COLORURINE YELLOW (A) 06/06/2021 0121   APPEARANCEUR CLEAR (A) 06/06/2021 0121   APPEARANCEUR Clear 04/09/2014 1215   LABSPEC 1.009 06/06/2021 0121   LABSPEC 1.015 04/09/2014 1215   PHURINE 5.0 06/06/2021 0121   GLUCOSEU NEGATIVE 06/06/2021 0121   GLUCOSEU Negative 04/09/2014 1215   HGBUR NEGATIVE 06/06/2021 0121   BILIRUBINUR NEGATIVE 06/06/2021 0121   BILIRUBINUR Negative 04/09/2014 1215   KETONESUR NEGATIVE 06/06/2021 0121   PROTEINUR NEGATIVE 06/06/2021 0121   UROBILINOGEN 1.0 10/23/2011 1845   NITRITE NEGATIVE 06/06/2021 0121   LEUKOCYTESUR NEGATIVE 06/06/2021 0121   LEUKOCYTESUR Negative 04/09/2014 1215   Sepsis Labs Recent Labs  Lab  06/07/21 0550 06/08/21 0605 06/09/21 0608 06/11/21 0625  WBC 5.9 6.0 7.0 5.6   Microbiology Recent Results (from the past 240 hour(s))  Blood Culture (routine x 2)     Status: None   Collection Time: 06/05/21 10:38 PM   Specimen: BLOOD  Result Value Ref Range Status   Specimen Description BLOOD RIGHT HAND  Final   Special Requests   Final    BOTTLES DRAWN AEROBIC AND ANAEROBIC Blood Culture adequate volume   Culture   Final    NO GROWTH 5 DAYS Performed at [facility], [address], [place], [place] [address]    Report Status 06/10/2021 FINAL  Final  Blood Culture (routine x 2)     Status: None   Collection Time: 06/05/21 10:45 PM   Specimen: BLOOD  Result Value Ref Range Status   Specimen Description BLOOD RIGHT ASSIST CONTROL  Final   Special Requests   Final    BOTTLES DRAWN AEROBIC AND ANAEROBIC Blood Culture adequate volume   Culture   Final  NO GROWTH 5 DAYS Performed at [facility], [place] [place], [place] [address]    Report Status 06/10/2021 FINAL  Final  SARS Coronavirus 2 by RT PCR (hospital order, performed in [facility] hospital lab) *cepheid single result test* Anterior Nasal Swab     Status: None   Collection Time: 06/05/21 11:38 PM   Specimen: Anterior Nasal Swab  Result Value Ref Range Status   SARS Coronavirus 2 by RT PCR NEGATIVE NEGATIVE Final    Comment: (NOTE) SARS-CoV-2 target nucleic acids are NOT DETECTED.  The SARS-CoV-2 RNA is generally detectable in upper and lower respiratory specimens during the acute phase of infection. The lowest concentration of SARS-CoV-2 viral copies this assay can detect is 250 copies / mL. A negative result does not preclude SARS-CoV-2 infection and should not be used as the sole basis for treatment or other patient management decisions.  A negative result may occur with improper specimen collection / handling, submission of specimen other than nasopharyngeal swab, presence of  viral mutation(s) within the areas targeted by this assay, and inadequate number of viral copies (<250 copies / mL). A negative result must be combined with clinical observations, patient history, and epidemiological information.  Fact Sheet for Patients:   https://www.patel.info/  Fact Sheet for Healthcare Providers: https://hall.com/  This test is not yet approved or  cleared by the [place] FDA and has been authorized for detection and/or diagnosis of SARS-CoV-2 by FDA under an Emergency Use Authorization (EUA).  This EUA will remain in effect (meaning this test can be used) for the duration of the COVID-19 declaration under Section 564(b)(1) of the Act, 21 U.S.C. section 360bbb-3(b)(1), unless the authorization is terminated or revoked sooner.  Performed at [facility], [address] [place], [place] [address]   Urine Culture     Status: None   Collection Time: 06/06/21  1:21 AM   Specimen: In/Out Cath Urine  Result Value Ref Range Status   Specimen Description   Final    IN/OUT CATH URINE Performed at [facility], [address], [place], [place] [address]    Special Requests   Final    NONE Performed at [facility], [address] [place], [place] [address]    Culture   Final    NO GROWTH Performed at [facility], [place] [address] [place], [place] [address]    Report Status 06/07/2021 FINAL  Final  Body fluid culture w Gram Stain     Status: None   Collection Time: 06/06/21  1:21 AM   Specimen: KNEE; Body Fluid  Result Value Ref Range Status   Specimen Description   Final    KNEE LEFT Performed at [facility], [address] [place], [place] [address]    Special Requests   Final    NONE Performed at [facility], [address] [place], [place] [address]    Gram Stain   Final    NO SQUAMOUS EPITHELIAL CELLS SEEN ABUNDANT WBC PRESENT, PREDOMINANTLY  PMN NO ORGANISMS SEEN    Culture   Final    NO GROWTH 3 DAYS Performed at [facility], [place] [address], [place], [place] [address]    Report Status 06/09/2021 FINAL  Final   Imaging CT HEAD WO CONTRAST (5MM)  Result Date: 06/06/2021 CLINICAL DATA:  Mental status change, unknown cause. Weakness and left arm pain. EXAM: CT HEAD WITHOUT CONTRAST TECHNIQUE: Contiguous axial images were obtained from  the base of the skull through the vertex without intravenous contrast. RADIATION DOSE REDUCTION: This exam was performed according to the departmental dose-optimization program which includes automated exposure control, adjustment of the mA and/or kV according to patient size and/or use of iterative reconstruction technique. COMPARISON:  Head CT 12/22/2019 FINDINGS: Brain: There is no evidence of an acute infarct, intracranial hemorrhage, mass, midline shift, or extra-axial fluid collection. The ventricles and sulci are borderline prominent for age which may reflect early cerebral atrophy. Vascular: No hyperdense vessel. Skull: No fracture or suspicious osseous lesion. Sinuses/Orbits: Trace fluid in the maxillary sinuses. Clear mastoid air cells. Unremarkable orbits. Other: None. IMPRESSION: No evidence of acute intracranial abnormality. Electronically Signed   By: Logan Bores M.D.   On: 06/06/2021 11:48   US Venous Img Upper Uni Left  Result Date: 06/06/2021 CLINICAL DATA:  History of left arm pain for 3 days EXAM: LEFT UPPER EXTREMITY VENOUS DOPPLER ULTRASOUND TECHNIQUE: Gray-scale sonography with graded compression, as well as color Doppler and duplex ultrasound were performed to evaluate the upper extremity deep venous system from the level of the subclavian vein and including the jugular, axillary, basilic, radial, ulnar and upper cephalic vein. Spectral Doppler was utilized to evaluate flow at rest and with distal augmentation maneuvers. COMPARISON:  None Available. FINDINGS: Contralateral  Subclavian Vein: Respiratory phasicity is normal and symmetric with the symptomatic side. No evidence of thrombus. Normal compressibility. Internal Jugular Vein: No evidence of thrombus. Normal compressibility, respiratory phasicity and response to augmentation. Subclavian Vein: No evidence of thrombus. Normal compressibility, respiratory phasicity and response to augmentation. Axillary Vein: No evidence of thrombus. Normal compressibility, respiratory phasicity and response to augmentation. Cephalic Vein: Short segment of nonocclusive thrombus is noted just above the antecubital fossa. Decreased compressibility is seen. Basilic Vein: No evidence of thrombus. Normal compressibility, respiratory phasicity and response to augmentation. Brachial Veins: No evidence of thrombus. Normal compressibility, respiratory phasicity and response to augmentation. Radial Veins: No evidence of thrombus. Normal compressibility, respiratory phasicity and response to augmentation. Ulnar Veins: No evidence of thrombus. Normal compressibility, respiratory phasicity and response to augmentation. Venous Reflux:  None visualized. Other Findings:  None visualized. IMPRESSION: Short segment of nonocclusive thrombus within cephalic vein near the elbow joint. Electronically Signed   By: Inez Catalina M.D.   On: 06/06/2021 03:08   CT Elbow Left Wo Contrast  Result Date: 06/06/2021 CLINICAL DATA:  Possible radius fracture EXAM: CT OF THE UPPER LEFT EXTREMITY WITHOUT CONTRAST TECHNIQUE: Multidetector CT imaging of the upper left extremity was performed according to the standard protocol. RADIATION DOSE REDUCTION: This exam was performed according to the departmental dose-optimization program which includes automated exposure control, adjustment of the mA and/or kV according to patient size and/or use of iterative reconstruction technique. COMPARISON:  None Available. FINDINGS: Bones/Joint/Cartilage No fracture or dislocation. Ligaments  Suboptimally assessed by CT. Muscles and Tendons Unremarkable Soft tissues Small hematoma superficial to the olecranon process. IMPRESSION: 1. No fracture or dislocation. 2. Small hematoma superficial to the olecranon process. Electronically Signed   By: Ulyses Jarred M.D.   On: 06/06/2021 02:39   DG Wrist Complete Left  Result Date: 06/06/2021 CLINICAL DATA:  Atraumatic left arm pain. EXAM: LEFT WRIST - COMPLETE 3+ VIEW COMPARISON:  None Available. FINDINGS: There is no evidence of an acute fracture or dislocation. Two radiopaque surgical clips are seen within the fourth left metacarpal. There is no evidence of arthropathy or other focal bone abnormality. Soft tissues are unremarkable. IMPRESSION: No acute osseous abnormality.  Electronically Signed   By: Virgina Norfolk M.D.   On: 06/06/2021 00:59   DG Elbow Complete Left  Result Date: 06/06/2021 CLINICAL DATA:  Atraumatic left arm pain. EXAM: LEFT ELBOW - COMPLETE 3+ VIEW COMPARISON:  None Available. FINDINGS: A very small area of cortical irregularity is seen along the volar aspect of the left radial head. This is of indeterminate age. There is no evidence of dislocation. A moderate to large bony spur is seen along the olecranon process of the proximal left ulna. Mild soft tissue swelling is seen along the dorsal aspect of the olecranon process. IMPRESSION: 1. Findings which may represent a very small left radial head fracture, of indeterminate age. Correlation with physical examination is recommended to determine the presence of point tenderness. 2. Degenerative changes with mild dorsal soft tissue swelling. Electronically Signed   By: Virgina Norfolk M.D.   On: 06/06/2021 00:58   DG Shoulder Left  Result Date: 06/06/2021 CLINICAL DATA:  Atraumatic left arm pain. EXAM: LEFT SHOULDER - 2+ VIEW COMPARISON:  None Available. FINDINGS: There is no evidence of fracture or dislocation. There is no evidence of arthropathy or other focal bone abnormality.  Soft tissues are unremarkable. IMPRESSION: Negative. Electronically Signed   By: Virgina Norfolk M.D.   On: 06/06/2021 00:54   DG Knee Complete 4 Views Left  Result Date: 06/05/2021 CLINICAL DATA:  Left knee pain. EXAM: LEFT KNEE - COMPLETE 4+ VIEW COMPARISON:  08/09/2019 FINDINGS: No fracture or dislocation. There is mild medial tibiofemoral joint space narrowing. Minimal peripheral spurring of the patellofemoral medial tibiofemoral compartment as well as tibial spines. Small quadriceps and patellar tendon enthesophytes. Minimal knee joint effusion. No erosion or bone destruction. IMPRESSION: Mild osteoarthritis and minimal joint effusion. No acute osseous abnormality. Electronically Signed   By: Keith Rake M.D.   On: 06/05/2021 22:38   DG Chest Port 1 View  Result Date: 06/05/2021 CLINICAL DATA:  Weakness.  Left arm pain.  Fever. EXAM: PORTABLE CHEST 1 VIEW COMPARISON:  09/01/2020 FINDINGS: Low lung volumes are present, causing crowding of the pulmonary vasculature. Upper normal heart size. The lungs appear clear. No blunting of the costophrenic angles. IMPRESSION: 1.  No active cardiopulmonary disease is radiographically apparent. 2. Low lung volumes. Electronically Signed   By: Van Clines M.D.   On: 06/05/2021 21:45      Time coordinating discharge: Over 30 minutes  SIGNED:  Emeterio Reeve DO [facility]

## 2021-06-12 NOTE — Progress Notes (Signed)
Occupational Therapy Treatment Patient Details Name: John [name]. MRN: [id number] DOB: 03/08/1975 Today's Date: 06/12/2021   History of present illness 46 y.o. male with medical history significant for Schizophrenia, OSA noncompliant with BiPAP, hypertension, asthma, gout bilateral knees, hospitalized in August 2022 with sepsis secondary to suspected right septic knee joint but with negative synovial fluid cultures who presents to the ED from his group home with a complaint of left knee pain as well as left arm pain without any reports of injury.  Noted with non-occlusive DVT to L UE (on anticoag) and gout with SIRS to L knee.   OT comments  Pt seen for OT treatment on this date. Upon arrival to room pt was alert and awake laying in bed. Pt reported swelling in BLE, encouraged by therapist to participate in therapy. Pt agreeable to tx focused on functional mobility and ADL t/f related to home set-up. Pt requires SUPERVISION with vcs for rocking for momentum and safe hand placment for STS and toilet t/f - x2 trials to stand with encouragement from therapist. SUPERVISION, with no AD for ambulating in hallway ~296f. Pt making good progress toward goals. Pt continues to benefit from skilled OT services to maximize return to PLOF and minimize risk of future falls, injury. Will continue to follow POC. Discharge recommendation remains appropriate.     Recommendations for follow up therapy are one component of a multi-disciplinary discharge planning process, led by the attending physician.  Recommendations may be updated based on patient status, additional functional criteria and insurance authorization.    Follow Up Recommendations  Skilled nursing-short term rehab (<3 hours/day)    Assistance Recommended at Discharge Frequent or constant Supervision/Assistance  Patient can return home with the following  A lot of help with walking and/or transfers;A lot of help with bathing/dressing/bathroom;Assist  for transportation;Assistance with cooking/housework   Equipment Recommendations  Other (comment) (defer to next venue of care)    Recommendations for Other Services      Precautions / Restrictions Precautions Precautions: Fall Restrictions Weight Bearing Restrictions: No       Mobility Bed Mobility Overal bed mobility: Needs Assistance Bed Mobility: Supine to Sit     Supine to sit: Supervision          Transfers Overall transfer level: Needs assistance Equipment used: None Transfers: Sit to/from Stand Sit to Stand: Supervision           General transfer comment: Pt required x2 trials to stand with vcs for rocking to gain momentum     Balance Overall balance assessment: Needs assistance Sitting-balance support: No upper extremity supported, Feet supported Sitting balance-Leahy Scale: Good     Standing balance support: No upper extremity supported, During functional activity Standing balance-Leahy Scale: Good                             ADL either performed or assessed with clinical judgement   ADL Overall ADL's : Needs assistance/impaired                                       General ADL Comments: SUPERVISION with vcs for rocking for momentum and safe hand placment for toilet t/f.      Cognition Arousal/Alertness: Awake/alert Behavior During Therapy: WFL for tasks assessed/performed Overall Cognitive Status: Within Functional Limits for tasks assessed  Pertinent Vitals/ Pain       Pain Assessment Pain Assessment: Faces Faces Pain Scale: No hurt   Frequency  Min 2X/week        Progress Toward Goals  OT Goals(current goals can now be found in the care plan section)  Progress towards OT goals: Progressing toward goals  Acute Rehab OT Goals Patient Stated Goal: to play basketball OT Goal Formulation: With patient Time For Goal  Achievement: 06/23/21 Potential to Achieve Goals: Good ADL Goals Pt Will Perform Grooming: with supervision;standing Pt Will Transfer to Toilet: ambulating;with min guard assist Pt Will Perform Toileting - Clothing Manipulation and hygiene: with min guard assist;sit to/from stand Pt Will Perform Tub/Shower Transfer: with min guard assist;ambulating  Plan Discharge plan remains appropriate;Frequency remains appropriate       AM-PAC OT "6 Clicks" Daily Activity     Outcome Measure   Help from another person eating meals?: None Help from another person taking care of personal grooming?: A Little Help from another person toileting, which includes using toliet, bedpan, or urinal?: A Little Help from another person bathing (including washing, rinsing, drying)?: A Little Help from another person to put on and taking off regular upper body clothing?: A Little Help from another person to put on and taking off regular lower body clothing?: A Little 6 Click Score: 19    End of Session Equipment Utilized During Treatment: Other (comment) (None)  OT Visit Diagnosis: Unsteadiness on feet (R26.81);Muscle weakness (generalized) (M62.81)   Activity Tolerance Patient tolerated treatment well   Patient Left in chair;with call bell/phone within reach;with chair alarm set   Nurse Communication          Time: [phone] OT Time Calculation (min): 27 min  Charges: OT General Charges $OT Visit: 1 Visit OT Treatments $Self Care/Home Management : 8-22 mins $Therapeutic Activity: 8-22 mins  [employer] OTDS  [employer] 06/12/2021, 1:36 PM

## 2021-06-12 NOTE — Progress Notes (Signed)
Physical Therapy Treatment Patient Details Name: John [name]. MRN: [id number] DOB: 1975-01-07 Today's Date: 06/12/2021   History of Present Illness 46 y.o. male with medical history significant for Schizophrenia, OSA noncompliant with BiPAP, hypertension, asthma, gout bilateral knees, hospitalized in August 2022 with sepsis secondary to suspected right septic knee joint but with negative synovial fluid cultures who presents to the ED from his group home with a complaint of left knee pain as well as left arm pain without any reports of injury.  Noted with non-occlusive DVT to L UE (on anticoag) and gout with SIRS to L knee.    PT Comments    Pt reports he already ambulated a lap around the nurses station but is agreeable to tx & ambulating again. Pt completes STS from recliner in room & low bench in hallway with mod I with extra time & pt electing to count "1-2-3" before transferring STS. Pt is able to ambulate increased distances without AD & mod I on this date. Pt appears to be close to his baseline level of function. Will continue to follow pt acutely to address balance.    Recommendations for follow up therapy are one component of a multi-disciplinary discharge planning process, led by the attending physician.  Recommendations may be updated based on patient status, additional functional criteria and insurance authorization.  Follow Up Recommendations  Home health PT     Assistance Recommended at Discharge Intermittent Supervision/Assistance  Patient can return home with the following Assistance with cooking/housework   Equipment Recommendations  Rolling walker (2 wheels)    Recommendations for Other Services       Precautions / Restrictions Precautions Precautions: Fall Restrictions Weight Bearing Restrictions: No     Mobility  Bed Mobility               General bed mobility comments: not observed, pt received & left sitting in recliner    Transfers Overall  transfer level: Modified independent   Transfers: Sit to/from Stand             General transfer comment: Pt able to transfer STS from recliner & from low bench in hallway without assistance with extra time & pt electing to count "1-2-3" before initiating STS    Ambulation/Gait Ambulation/Gait assistance: Modified independent (Device/Increase time) Gait Distance (Feet):  (100 + 400 ft) Assistive device: None Gait Pattern/deviations: Wide base of support, Decreased step length - right, Decreased dorsiflexion - right, Decreased step length - left, Decreased dorsiflexion - left, Decreased stride length Gait velocity: decreased     General Gait Details: Pt ambulates room>bench in hallway then lap around nurses station before returning to room. Pt reports he is used to ambulating frequently at home.   Stairs             Wheelchair Mobility    Modified Rankin (Stroke Patients Only)       Balance Overall balance assessment: Needs assistance Sitting-balance support: No upper extremity supported, Feet supported Sitting balance-Leahy Scale: Good     Standing balance support: No upper extremity supported, During functional activity Standing balance-Leahy Scale: Good                              Cognition Arousal/Alertness: Lethargic Behavior During Therapy: Flat affect Overall Cognitive Status: No family/caregiver present to determine baseline cognitive functioning  General Comments: Pt able to state his birthday but recalls incorrect age (states he's 46 y/o but he's 46 y/o), follows simple commands throughout session        Exercises      General Comments        Pertinent Vitals/Pain Pain Assessment Pain Assessment: No/denies pain    Home Living                          Prior Function            PT Goals (current goals can now be found in the care plan section) Acute Rehab PT  Goals Patient Stated Goal: to return to group home PT Goal Formulation: With patient Time For Goal Achievement: 06/23/21 Potential to Achieve Goals: Good Progress towards PT goals: Progressing toward goals    Frequency    Min 2X/week      PT Plan Current plan remains appropriate    Co-evaluation              AM-PAC PT "6 Clicks" Mobility   Outcome Measure  Help needed turning from your back to your side while in a flat bed without using bedrails?: None Help needed moving from lying on your back to sitting on the side of a flat bed without using bedrails?: None Help needed moving to and from a bed to a chair (including a wheelchair)?: None Help needed standing up from a chair using your arms (e.g., wheelchair or bedside chair)?: None Help needed to walk in hospital room?: None Help needed climbing 3-5 steps with a railing? : A Little 6 Click Score: 23    End of Session Equipment Utilized During Treatment: Gait belt Activity Tolerance: Patient tolerated treatment well Patient left: in chair;with chair alarm set;with call bell/phone within reach   PT Visit Diagnosis: Muscle weakness (generalized) (M62.81);Unsteadiness on feet (R26.81)     Time: [id number] PT Time Calculation (min) (ACUTE ONLY): 10 min  Charges:  $Therapeutic Activity: 8-22 mins                     Lavone Nian, PT, DPT 06/12/21, 11:47 AM   Waunita Schooner 06/12/2021, 11:46 AM

## 2021-06-12 NOTE — TOC Progression Note (Signed)
Transition of Care (TOC) - Progression Note    Patient Details  Name: Michail Boyte. MRN: [id number] Date of Birth: [birth year]  Transition of Care [facility]) CM/SW Contact  Pete Pelt, RN Phone Number: 06/12/2021, 10:04 AM  Clinical Narrative:   RNCM contacted Mr. Brayton El at [phone], awaiting call back to determine if patient can return to group home, as he now has orders for HHPT.      Expected Discharge Plan: Group Home Barriers to Discharge: Continued Medical Work up  Expected Discharge Plan and Services Expected Discharge Plan: Group Home       Living arrangements for the past 2 months: Group Home                                       Social Determinants of Health (SDOH) Interventions    Readmission Risk Interventions     No data to display

## 2021-06-13 ENCOUNTER — Telehealth: Payer: Self-pay | Admitting: *Deleted

## 2021-06-13 NOTE — Telephone Encounter (Signed)
Unable to reach group home

## 2021-06-14 ENCOUNTER — Other Ambulatory Visit: Payer: Self-pay | Admitting: Internal Medicine

## 2021-06-20 ENCOUNTER — Encounter: Payer: Self-pay | Admitting: Emergency Medicine

## 2021-06-20 ENCOUNTER — Other Ambulatory Visit: Payer: Self-pay

## 2021-06-20 ENCOUNTER — Emergency Department: Payer: 59

## 2021-06-20 ENCOUNTER — Emergency Department
Admission: EM | Admit: 2021-06-20 | Discharge: 2021-06-20 | Disposition: A | Discharge status: Home / Self Care | Payer: 59 | Attending: Emergency Medicine | Admitting: Emergency Medicine

## 2021-06-20 DIAGNOSIS — J45909 Unspecified asthma, uncomplicated: Secondary | ICD-10-CM | POA: Insufficient documentation

## 2021-06-20 DIAGNOSIS — E119 Type 2 diabetes mellitus without complications: Secondary | ICD-10-CM | POA: Diagnosis not present

## 2021-06-20 DIAGNOSIS — M79602 Pain in left arm: Secondary | ICD-10-CM | POA: Diagnosis present

## 2021-06-20 DIAGNOSIS — I809 Phlebitis and thrombophlebitis of unspecified site: Secondary | ICD-10-CM

## 2021-06-20 MED ORDER — PREDNISONE 10 MG (21) PO TBPK: ORAL_TABLET | ORAL | 0 refills | Status: AC

## 2021-06-20 MED ORDER — AMOXICILLIN 875 MG PO TABS: 875.0000 mg | ORAL_TABLET | Freq: Two times a day (BID) | ORAL | 0 refills | Status: DC

## 2021-06-20 NOTE — ED Provider Notes (Signed)
[facility] Provider Note    None    (approximate)   History   Leg Pain and Arm Pain   HPI  John Villacis. is a 46 y.o. male with history of diabetes, thyroid disease asthma schizophrenia and obesity presents emergency department with concerns of left arm pain.  No fever or chills.  No chest pain or shortness of breath.      Physical Exam   Triage Vital Signs: ED Triage Vitals  Enc Vitals Group     BP 06/20/21 1459 110/79     Pulse Rate 06/20/21 1459 89     Resp 06/20/21 1459 18     Temp 06/20/21 1459 99 F (37.2 C)     Temp src --      SpO2 06/20/21 1459 97 %     Weight 06/20/21 1500 293 lb 3.4 oz (133 kg)     Height 06/20/21 1500 '6\' 2"'$  (1.88 m)     Head Circumference --      Peak Flow --      Pain Score 06/20/21 1500 10     Pain Loc --      Pain Edu? --      Excl. in [place]? --     Most recent vital signs: Vitals:   06/20/21 1459  BP: 110/79  Pulse: 89  Resp: 18  Temp: 99 F (37.2 C)  SpO2: 97%     General: Awake, no distress.   CV:  Good peripheral perfusion. regular rate and  rhythm Resp:  Normal effort.  Abd:  No distention.   Other:  Left arm is tender at the antecubital, some tenderness to the bicep   ED Results / Procedures / Treatments   Labs (all labs ordered are listed, but only abnormal results are displayed) Labs Reviewed - No data to display   EKG     RADIOLOGY Ultrasound left upper extremity    PROCEDURES:   Procedures   MEDICATIONS ORDERED IN ED: Medications - No data to display   IMPRESSION / MDM / [place] / ED COURSE  I reviewed the triage vital signs and the nursing notes.                              Differential diagnosis includes, but is not limited to, DVT, tendinitis, diabetes  Patient's presentation is most consistent with acute complicated illness / injury requiring diagnostic workup.   Ultrasound reviewed and interpreted by me as being negative for any acute  abnormality.  I did explain findings to the patient.  He was given a prescription for amoxicillin and Sterapred.  He is to follow-up with his regular doctor.  Return emergency department worsening.  Discharged stable condition.  Group home was called to come and pick him up.      FINAL CLINICAL IMPRESSION(S) / ED DIAGNOSES   Final diagnoses:  Phlebitis  Left arm pain     Rx / DC Orders   ED Discharge Orders          Ordered    amoxicillin (AMOXIL) 875 MG tablet  2 times daily        06/20/21 1650    predniSONE (STERAPRED UNI-PAK 21 TAB) 10 MG (21) TBPK tablet        06/20/21 1651             Note:  This document was prepared using Dragon voice  recognition software and may include unintentional dictation errors.    Versie Starks, PA-C 06/20/21 1915    Harvest Dark, MD 06/20/21 2253

## 2021-06-20 NOTE — Discharge Instructions (Addendum)
Follow-up with your regular doctor if not improving 3 days.  Return emergency department if worsening or not better in 1 week.  Take medications as prescribed.  Apply a warm compress to the area.

## 2021-06-20 NOTE — ED Provider Triage Note (Signed)
Emergency Medicine Provider Triage Evaluation Note  Donielle Radziewicz. , a 46 y.o. male  was evaluated in triage.  Pt complains of left arm pain.  Review of Systems  Positive: As above Negative:   Physical Exam  There were no vitals taken for this visit. Gen:   Awake, no distress   Resp:  Normal effort  MSK:   Moves extremities without difficulty, normal pulses Other:    Medical Decision Making  Medically screening exam initiated at 2:59 PM.  Appropriate orders placed.  [name] was informed that the remainder of the evaluation will be completed by another provider, this initial triage assessment does not replace that evaluation, and the importance of remaining in the ED until their evaluation is complete.  Pending ultrasound left upper extremity for known DVT   Lavonia Drafts, MD 06/20/21 1513

## 2021-06-20 NOTE — ED Triage Notes (Signed)
Pt from group home. Pt recently out of hospital for DVT. Endorses pain to left arm pain and left leg pain.

## 2021-07-06 ENCOUNTER — Other Ambulatory Visit: Payer: Self-pay | Admitting: Internal Medicine

## 2021-07-20 ENCOUNTER — Other Ambulatory Visit: Payer: Self-pay | Admitting: Internal Medicine

## 2021-08-03 ENCOUNTER — Other Ambulatory Visit: Payer: Self-pay | Admitting: Internal Medicine

## 2021-08-24 ENCOUNTER — Other Ambulatory Visit: Payer: Self-pay | Admitting: Internal Medicine

## 2021-09-11 ENCOUNTER — Other Ambulatory Visit: Payer: Self-pay | Admitting: Internal Medicine

## 2021-09-14 ENCOUNTER — Other Ambulatory Visit: Payer: Self-pay | Admitting: Internal Medicine

## 2021-10-10 ENCOUNTER — Other Ambulatory Visit: Payer: Self-pay | Admitting: Internal Medicine

## 2021-10-11 ENCOUNTER — Encounter: Payer: Self-pay | Admitting: Internal Medicine

## 2021-10-11 ENCOUNTER — Ambulatory Visit (INDEPENDENT_AMBULATORY_CARE_PROVIDER_SITE_OTHER): Payer: 59 | Admitting: Internal Medicine

## 2021-10-11 VITALS — BP 128/86 | HR 82 | Ht 74.0 in | Wt 291.9 lb

## 2021-10-11 DIAGNOSIS — Z23 Encounter for immunization: Secondary | ICD-10-CM

## 2021-10-11 DIAGNOSIS — E039 Hypothyroidism, unspecified: Secondary | ICD-10-CM | POA: Diagnosis not present

## 2021-10-11 DIAGNOSIS — G4733 Obstructive sleep apnea (adult) (pediatric): Secondary | ICD-10-CM | POA: Diagnosis not present

## 2021-10-11 DIAGNOSIS — I10 Essential (primary) hypertension: Secondary | ICD-10-CM | POA: Diagnosis not present

## 2021-10-11 DIAGNOSIS — E1169 Type 2 diabetes mellitus with other specified complication: Secondary | ICD-10-CM

## 2021-10-11 LAB — GLUCOSE, POCT (MANUAL RESULT ENTRY): POC Glucose: 103 mg/dl — AB (ref 70–99)

## 2021-10-11 LAB — POCT GLYCOSYLATED HEMOGLOBIN (HGB A1C): HbA1c POC (<> result, manual entry): 6.5 % (ref 4.0–5.6)

## 2021-10-11 NOTE — Assessment & Plan Note (Signed)
Type 2 Diabetes: Compliant with current medications -  Denies polydipsia, polyuria, polyphagia. Denies blurred vision or vision change. Denies extremity pain, altered sensation and paresthesias.  Denies ulcers/wounds on feet. Diabetes associated symptoms: none Home blood glucose range:103 Hypoglycemia frequency: none Annual diabetic foot exam: UTD Annual diabetic eye exam: UTD

## 2021-10-11 NOTE — Progress Notes (Signed)
Established Patient Office Visit  Subjective:  Patient ID: John Portell., male    DOB: [birth year]  Age: 46 y.o. MRN: [id number]  CC:  Chief Complaint  Patient presents with   Diabetes      John [name]. presents for diabetes check ,   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 tophus 07/20/2019   Morbid obesity ([place])    Paranoid schizophrenia ([place])    Personality disorder ([place])    Sleep apnea    Thyroid disease     Past Surgical History:  Procedure Laterality Date   FRACTURE SURGERY     KNEE ARTHROSCOPY Right 08/30/2020   Procedure: ARTHROSCOPY KNEE- I&D, PARTIAL LATERAL MENISCECTOMY;  Surgeon: [name], MD;  Location: [facility] ORS;  Service: Orthopedics;  Laterality: Right;    Family History  Problem Relation Age of Onset   Glaucoma Mother     Social History   Socioeconomic History   Marital status: Single    Spouse name: Not on file   Number of children: Not on file   Years of education: Not on file   Highest education level: Not on file  Occupational History   Not on file  Tobacco Use   Smoking status: Former   Smokeless tobacco: Never  Vaping Use   Vaping Use: Never used  Substance and Sexual Activity   Alcohol use: Not Currently   Drug use: No   Sexual activity: Not on file  Other Topics Concern   Not on file  Social History Narrative   Not on file   Social Determinants of Health   Financial Resource Strain: Low Risk  (10/27/2020)   Overall Financial Resource Strain (CARDIA)    Difficulty of Paying Living Expenses: Not hard at all  Food Insecurity: No Food Insecurity (10/27/2020)   Hunger Vital Sign    Worried About Running Out of Food in the Last Year: Never true    [place] in the Last Year: Never true  Transportation Needs: No Transportation Needs (10/27/2020)   PRAPARE - Hydrologist (Medical): No    Lack  of Transportation (Non-Medical): No  Physical Activity: Sufficiently Active (10/27/2020)   Exercise Vital Sign    Days of Exercise per Week: 5 days    Minutes of Exercise per Session: 30 min  Stress: No Stress Concern Present (10/27/2020)   [place]    Feeling of Stress : Not at all  Social Connections: Socially Isolated (10/27/2020)   Social Connection and Isolation Panel [NHANES]    Frequency of Communication with Friends and Family: More than three times a week    Frequency of Social Gatherings with Friends and Family: More than three times a week    Attends Religious Services: Never    Marine scientist or Organizations: No    Attends Archivist Meetings: Never    Marital Status: Never married  Intimate Partner Violence: Not At Risk (10/27/2020)   Humiliation, Afraid, Rape, and Kick questionnaire    Fear of Current or Ex-Partner: No    Emotionally Abused: No    Physically Abused: No    Sexually Abused: No     Current Outpatient Medications:    ACCU-CHEK GUIDE test strip, USE TO CHECK BLOOD SUGAR DAILY, Disp: 100 each, Rfl:  10   Accu-Chek Softclix Lancets lancets, USE TO CHECK BLOOD SUGAR DAILY, Disp: 100 each, Rfl: 12   albuterol (PROVENTIL) (2.5 MG/3ML) 0.083% nebulizer solution, USE 1 VIAL VIA NEBULIZER 3 TIMES A DAY, Disp: 300 mL, Rfl: 6   Alcohol Swabs (B-D SINGLE USE SWABS REGULAR) PADS, USE AS DIRECTED FOR BLOOD SUGAR CHECKS., Disp: 100 each, Rfl: 10   allopurinol (ZYLOPRIM) 100 MG tablet, Take 1 tablet (100 mg total) by mouth daily., Disp: 30 tablet, Rfl: 0   amoxicillin (AMOXIL) 875 MG tablet, Take 1 tablet (875 mg total) by mouth 2 (two) times daily., Disp: 14 tablet, Rfl: 0   Asenapine Maleate 10 MG SUBL, Place 1 tablet under the tongue daily., Disp: , Rfl:    atenolol (TENORMIN) 100 MG tablet, TAKE 1 TABLET BY MOUTH DAILY, Disp: 30 tablet, Rfl: 3   atorvastatin (LIPITOR) 10 MG tablet,  TAKE 1 TABLET BY MOUTH DAILY, Disp: 30 tablet, Rfl: 10   cloZAPine (CLOZARIL) 100 MG tablet, Take 150 mg by mouth at bedtime., Disp: , Rfl:    colchicine 0.6 MG tablet, Take 1 tablet (0.6 mg total) by mouth 2 (two) times daily., Disp: 60 tablet, Rfl: 0   divalproex (DEPAKOTE ER) 500 MG 24 hr tablet, Take 2,000 mg by mouth at bedtime. , Disp: , Rfl:    ELIQUIS 5 MG TABS tablet, TAKE 1 TABLET BY MOUTH TWICE DAILY, Disp: 60 tablet, Rfl: 0   levothyroxine (SYNTHROID) 88 MCG tablet, TAKE 1 TABLET BY MOUTH DAILY BEFORE BREAKFAST, Disp: 30 tablet, Rfl: 0   loratadine (CLARITIN) 10 MG tablet, TAKE 1 TABLET BY MOUTH DAILY, Disp: 30 tablet, Rfl: 4   metFORMIN (GLUCOPHAGE) 1000 MG tablet, TAKE 1 TABLET BY MOUTH TWICE A DAY WITH FOOD, Disp: 60 tablet, Rfl: 6   omeprazole (PRILOSEC) 20 MG capsule, TAKE 1 CAPSULE BY MOUTH DAILY, Disp: 30 capsule, Rfl: 6   oxybutynin (DITROPAN) 5 MG tablet, TAKE 1 TABLET BY MOUTH DAILY, Disp: 30 tablet, Rfl: 3   predniSONE (STERAPRED UNI-PAK 21 TAB) 10 MG (21) TBPK tablet, Take 6 pills on day one then decrease by 1 pill each day, Disp: 21 tablet, Rfl: 0   No Known Allergies  ROS Review of Systems  Constitutional: Negative.   HENT: Negative.    Eyes: Negative.   Respiratory: Negative.    Cardiovascular: Negative.   Gastrointestinal: Negative.   Endocrine: Negative.   Genitourinary: Negative.   Musculoskeletal: Negative.   Skin: Negative.   Allergic/Immunologic: Negative.   Neurological: Negative.   Hematological: Negative.   Psychiatric/Behavioral: Negative.    All other systems reviewed and are negative.     Objective:    Physical Exam Vitals reviewed.  Constitutional:      Appearance: Normal appearance. He is obese. He is not ill-appearing, toxic-appearing or diaphoretic.  HENT:     Mouth/Throat:     Mouth: Mucous membranes are moist.  Eyes:     Pupils: Pupils are equal, round, and reactive to light.  Neck:     Vascular: No carotid bruit.   Cardiovascular:     Rate and Rhythm: Normal rate and regular rhythm.     Pulses: Normal pulses.     Heart sounds: Normal heart sounds.  Pulmonary:     Effort: Pulmonary effort is normal.     Breath sounds: Normal breath sounds.  Abdominal:     General: Bowel sounds are normal.     Palpations: Abdomen is soft. There is no hepatomegaly, splenomegaly or mass.  Tenderness: There is no abdominal tenderness.     Hernia: No hernia is present.  Musculoskeletal:        General: Normal range of motion.     Cervical back: Neck supple.     Right lower leg: No edema.     Left lower leg: No edema.  Skin:    Findings: No rash.  Neurological:     General: No focal deficit present.     Mental Status: He is alert and oriented to person, place, and time.     Motor: No weakness.  Psychiatric:        Mood and Affect: Mood normal.        Behavior: Behavior normal.     BP 128/86   Pulse 82   Ht '6\' 2"'$  (1.88 m)   Wt 291 lb 14.4 oz (132.4 kg)   BMI 37.48 kg/m  Wt Readings from Last 3 Encounters:  10/11/21 291 lb 14.4 oz (132.4 kg)  06/20/21 293 lb 3.4 oz (133 kg)  06/05/21 293 lb (132.9 kg)     Health Maintenance Due  Topic Date Due   TETANUS/TDAP  06/24/2005   Diabetic kidney evaluation - Urine ACR  06/29/2015   COVID-19 Vaccine (3 - Moderna series) 06/26/2019   OPHTHALMOLOGY EXAM  02/01/2021    There are no preventive care reminders to display for this patient.  Lab Results  Component Value Date   TSH 5.613 (H) 06/05/2021   Lab Results  Component Value Date   WBC 5.6 06/11/2021   HGB 9.8 (L) 06/11/2021   HCT 33.2 (L) 06/11/2021   MCV 86.5 06/11/2021   PLT 250 06/11/2021   Lab Results  Component Value Date   NA 136 06/11/2021   K 4.1 06/11/2021   CO2 29 06/11/2021   GLUCOSE 131 (H) 06/11/2021   BUN 16 06/11/2021   CREATININE 0.65 06/11/2021   BILITOT 0.6 06/05/2021   ALKPHOS 42 06/05/2021   AST 13 (L) 06/05/2021   ALT 9 06/05/2021   PROT 8.3 (H) 06/05/2021    ALBUMIN 3.3 (L) 06/05/2021   CALCIUM 8.6 (L) 06/11/2021   ANIONGAP 7 06/11/2021   Lab Results  Component Value Date   CHOL 146 11/24/2013   Lab Results  Component Value Date   HDL 22 (L) 11/24/2013   Lab Results  Component Value Date   LDLCALC 94 11/24/2013   Lab Results  Component Value Date   TRIG 152 11/24/2013   No results found for: "CHOLHDL" Lab Results  Component Value Date   HGBA1C 6.5 10/11/2021      Assessment & Plan:   Problem List Items Addressed This Visit       Cardiovascular and Mediastinum   Essential hypertension    - Today , the patient's blood pressure  is well managed on  -    the current treatment regimen.  - I encouraged the patient to eat a low-sodium diet to help control blood pressure. - I encouraged the patient to live an active lifestyle and complete activities that increases heart rate to 85% target heart rate at least 5 times per week for one hour.           Respiratory   OSA on CPAP    Use cpap daily        Endocrine   Diabetes mellitus ([place]) - Primary    Type 2 Diabetes: Compliant with current medications -  Denies polydipsia, polyuria, polyphagia. Denies blurred vision or vision change. Denies extremity  pain, altered sensation and paresthesias.  Denies ulcers/wounds on feet. Diabetes associated symptoms: none Home blood glucose range:103 Hypoglycemia frequency: none Annual diabetic foot exam: UTD Annual diabetic eye exam: UTD       Relevant Orders   POCT glucose (manual entry) (Completed)   POCT HgB A1C (Completed)   Hypothyroidism    stable      Other Visit Diagnoses     Need for influenza vaccination       Relevant Orders   Flu Vaccine QUAD 6+ mos PF IM (Fluarix Quad PF) (Completed)     - The patient's metabolic syndrome is stable - Encouraged the patient to make healthy lifestyle choices including increased exercise, eating a healthy diety, and losing weight.  - Instructed the patient to seek help if they  develop dizziness, blurry vision, trouble speaking, weakness in an arm or leg, chest pain, or trouble breathing.    No orders of the defined types were placed in this encounter.   Follow-up: No follow-ups on file.    Cletis Athens, MD

## 2021-10-11 NOTE — Assessment & Plan Note (Signed)
Use cpap daily

## 2021-10-11 NOTE — Assessment & Plan Note (Signed)
-   Today , the patient's blood pressure  is well managed on  -    the current treatment regimen.  - I encouraged the patient to eat a low-sodium diet to help control blood pressure. - I encouraged the patient to live an active lifestyle and complete activities that increases heart rate to 85% target heart rate at least 5 times per week for one hour.

## 2021-10-11 NOTE — Assessment & Plan Note (Signed)
stable °

## 2021-10-24 ENCOUNTER — Other Ambulatory Visit: Payer: Self-pay | Admitting: Internal Medicine

## 2021-11-21 ENCOUNTER — Other Ambulatory Visit: Payer: Self-pay | Admitting: Internal Medicine

## 2021-11-29 ENCOUNTER — Other Ambulatory Visit: Payer: Self-pay | Admitting: Internal Medicine

## 2021-12-04 ENCOUNTER — Other Ambulatory Visit: Payer: Self-pay | Admitting: Internal Medicine

## 2021-12-12 ENCOUNTER — Ambulatory Visit: Payer: 59 | Admitting: Internal Medicine

## 2021-12-17 ENCOUNTER — Other Ambulatory Visit: Payer: Self-pay | Admitting: Nurse Practitioner

## 2021-12-17 ENCOUNTER — Other Ambulatory Visit: Payer: Self-pay | Admitting: Internal Medicine

## 2021-12-18 ENCOUNTER — Encounter: Payer: Self-pay | Admitting: Internal Medicine

## 2021-12-18 ENCOUNTER — Ambulatory Visit (INDEPENDENT_AMBULATORY_CARE_PROVIDER_SITE_OTHER): Payer: 59 | Admitting: Internal Medicine

## 2021-12-18 VITALS — BP 142/90 | HR 82 | Temp 96.7°F | Ht 74.0 in | Wt 288.0 lb

## 2021-12-18 DIAGNOSIS — E089 Diabetes mellitus due to underlying condition without complications: Secondary | ICD-10-CM

## 2021-12-18 DIAGNOSIS — G4734 Idiopathic sleep related nonobstructive alveolar hypoventilation: Secondary | ICD-10-CM

## 2021-12-18 DIAGNOSIS — I10 Essential (primary) hypertension: Secondary | ICD-10-CM | POA: Diagnosis not present

## 2021-12-18 DIAGNOSIS — Z720 Tobacco use: Secondary | ICD-10-CM | POA: Diagnosis not present

## 2021-12-18 DIAGNOSIS — E119 Type 2 diabetes mellitus without complications: Secondary | ICD-10-CM

## 2021-12-18 LAB — GLUCOSE, POCT (MANUAL RESULT ENTRY): POC Glucose: 117 mg/dl — AB (ref 70–99)

## 2021-12-18 NOTE — Assessment & Plan Note (Signed)

## 2021-12-18 NOTE — Assessment & Plan Note (Signed)
-   I instructed the patient to stop smoking and provided them with smoking cessation materials.  - I informed the patient that smoking puts them at increased risk for cancer, COPD, hypertension, and more.  - Informed the patient to seek help if they begin to have trouble breathing, develop chest pain, start to cough up blood, feel faint, or pass out.  

## 2021-12-18 NOTE — Progress Notes (Signed)
Established Patient Office Visit  Subjective:  Patient ID: John Haubner., male    DOB: 30-Mar-1975  Age: 46 y.o. MRN: [id number]  CC:  Chief Complaint  Patient presents with   Diabetes    Diabetes    John [name]. 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 tophus 07/20/2019   Morbid obesity ([place])    Paranoid schizophrenia ([place])    Personality disorder ([place])    Sleep apnea    Thyroid disease     Past Surgical History:  Procedure Laterality Date   FRACTURE SURGERY     KNEE ARTHROSCOPY Right 08/30/2020   Procedure: ARTHROSCOPY KNEE- I&D, PARTIAL LATERAL MENISCECTOMY;  Surgeon: [name], MD;  Location: [facility] ORS;  Service: Orthopedics;  Laterality: Right;    Family History  Problem Relation Age of Onset   Glaucoma Mother     Social History   Socioeconomic History   Marital status: Single    Spouse name: Not on file   Number of children: Not on file   Years of education: Not on file   Highest education level: Not on file  Occupational History   Not on file  Tobacco Use   Smoking status: Former   Smokeless tobacco: Never  Vaping Use   Vaping Use: Never used  Substance and Sexual Activity   Alcohol use: Not Currently   Drug use: No   Sexual activity: Not on file  Other Topics Concern   Not on file  Social History Narrative   Not on file   Social Determinants of Health   Financial Resource Strain: Low Risk  (10/27/2020)   Overall Financial Resource Strain (CARDIA)    Difficulty of Paying Living Expenses: Not hard at all  Food Insecurity: No Food Insecurity (10/27/2020)   Hunger Vital Sign    Worried About Running Out of Food in the Last Year: Never true    [place] in the Last Year: Never true  Transportation Needs: No Transportation Needs (10/27/2020)   PRAPARE - Hydrologist (Medical): No     Lack of Transportation (Non-Medical): No  Physical Activity: Sufficiently Active (10/27/2020)   Exercise Vital Sign    Days of Exercise per Week: 5 days    Minutes of Exercise per Session: 30 min  Stress: No Stress Concern Present (10/27/2020)   [place]    Feeling of Stress : Not at all  Social Connections: Socially Isolated (10/27/2020)   Social Connection and Isolation Panel [NHANES]    Frequency of Communication with Friends and Family: More than three times a week    Frequency of Social Gatherings with Friends and Family: More than three times a week    Attends Religious Services: Never    Marine scientist or Organizations: No    Attends Archivist Meetings: Never    Marital Status: Never married  Intimate Partner Violence: Not At Risk (10/27/2020)   Humiliation, Afraid, Rape, and Kick questionnaire    Fear of Current or Ex-Partner: No    Emotionally Abused: No    Physically Abused: No    Sexually Abused: No     Current Outpatient Medications:    ACCU-CHEK GUIDE test strip, USE TO CHECK BLOOD SUGAR DAILY, Disp: 100 each, Rfl:  10   Accu-Chek Softclix Lancets lancets, USE TO CHECK BLOOD SUGAR DAILY, Disp: 100 each, Rfl: 12   albuterol (PROVENTIL) (2.5 MG/3ML) 0.083% nebulizer solution, USE 1 VIAL VIA NEBULIZER 3 TIMES A DAY, Disp: 300 mL, Rfl: 6   Alcohol Swabs (B-D SINGLE USE SWABS REGULAR) PADS, USE AS DIRECTED FOR BLOOD SUGAR CHECKS., Disp: 100 each, Rfl: 10   allopurinol (ZYLOPRIM) 100 MG tablet, Take 1 tablet (100 mg total) by mouth daily., Disp: 30 tablet, Rfl: 0   Asenapine Maleate 10 MG SUBL, Place 1 tablet under the tongue daily., Disp: , Rfl:    atenolol (TENORMIN) 100 MG tablet, TAKE 1 TABLET BY MOUTH DAILY, Disp: 30 tablet, Rfl: 3   atorvastatin (LIPITOR) 10 MG tablet, TAKE 1 TABLET BY MOUTH DAILY, Disp: 30 tablet, Rfl: 10   cloZAPine (CLOZARIL) 100 MG tablet, Take 150 mg by mouth at  bedtime., Disp: , Rfl:    colchicine 0.6 MG tablet, Take 1 tablet (0.6 mg total) by mouth 2 (two) times daily., Disp: 60 tablet, Rfl: 0   divalproex (DEPAKOTE ER) 500 MG 24 hr tablet, Take 2,000 mg by mouth at bedtime. , Disp: , Rfl:    ELIQUIS 5 MG TABS tablet, TAKE 1 TABLET BY MOUTH TWICE DAILY, Disp: 60 tablet, Rfl: 0   levothyroxine (SYNTHROID) 88 MCG tablet, TAKE 1 TABLET BY MOUTH DAILY BEFORE BREAKFAST, Disp: 30 tablet, Rfl: 0   loratadine (CLARITIN) 10 MG tablet, TAKE 1 TABLET BY MOUTH DAILY, Disp: 30 tablet, Rfl: 4   metFORMIN (GLUCOPHAGE) 1000 MG tablet, TAKE 1 TABLET BY MOUTH TWICE A DAY WITH FOOD, Disp: 180 tablet, Rfl: 1   omeprazole (PRILOSEC) 20 MG capsule, TAKE 1 CAPSULE BY MOUTH DAILY, Disp: 30 capsule, Rfl: 6   oxybutynin (DITROPAN) 5 MG tablet, TAKE 1 TABLET BY MOUTH DAILY, Disp: 30 tablet, Rfl: 3   predniSONE (STERAPRED UNI-PAK 21 TAB) 10 MG (21) TBPK tablet, Take 6 pills on day one then decrease by 1 pill each day, Disp: 21 tablet, Rfl: 0   No Known Allergies  ROS Review of Systems  Constitutional: Negative.   HENT: Negative.    Eyes: Negative.   Respiratory: Negative.    Cardiovascular: Negative.   Gastrointestinal: Negative.   Endocrine: Negative.   Genitourinary: Negative.   Musculoskeletal: Negative.   Skin: Negative.   Allergic/Immunologic: Negative.   Neurological: Negative.   Hematological: Negative.   Psychiatric/Behavioral: Negative.    All other systems reviewed and are negative.     Objective:    Physical Exam Vitals reviewed.  Constitutional:      Appearance: Normal appearance.  HENT:     Mouth/Throat:     Mouth: Mucous membranes are moist.  Eyes:     Pupils: Pupils are equal, round, and reactive to light.  Neck:     Vascular: No carotid bruit.  Cardiovascular:     Rate and Rhythm: Normal rate and regular rhythm.     Pulses: Normal pulses.     Heart sounds: Normal heart sounds.  Pulmonary:     Effort: Pulmonary effort is normal.      Breath sounds: Normal breath sounds.  Abdominal:     General: Bowel sounds are normal.     Palpations: Abdomen is soft. There is no hepatomegaly, splenomegaly or mass.     Tenderness: There is no abdominal tenderness.     Hernia: No hernia is present.  Musculoskeletal:     Cervical back: Neck supple.     Right lower leg:  No edema.     Left lower leg: No edema.  Skin:    Findings: No rash.  Neurological:     Mental Status: He is alert and oriented to person, place, and time.     Motor: No weakness.  Psychiatric:        Mood and Affect: Mood normal.        Behavior: Behavior normal.     BP (!) 142/90   Pulse 82   Temp (!) 96.7 F (35.9 C) (Temporal)   Ht '6\' 2"'$  (1.88 m)   Wt 288 lb (130.6 kg)   SpO2 99%   BMI 36.98 kg/m  Wt Readings from Last 3 Encounters:  12/18/21 288 lb (130.6 kg)  10/11/21 291 lb 14.4 oz (132.4 kg)  06/20/21 293 lb 3.4 oz (133 kg)     Health Maintenance Due  Topic Date Due   Diabetic kidney evaluation - Urine ACR  Never done   DTaP/Tdap/Td (2 - Tdap) 06/24/2005   OPHTHALMOLOGY EXAM  02/01/2021   COVID-19 Vaccine (3 - 2023-24 season) 09/01/2021   Medicare Annual Wellness (AWV)  10/27/2021   FOOT EXAM  11/11/2021    There are no preventive care reminders to display for this patient.  Lab Results  Component Value Date   TSH 5.613 (H) 06/05/2021   Lab Results  Component Value Date   WBC 5.6 06/11/2021   HGB 9.8 (L) 06/11/2021   HCT 33.2 (L) 06/11/2021   MCV 86.5 06/11/2021   PLT 250 06/11/2021   Lab Results  Component Value Date   NA 136 06/11/2021   K 4.1 06/11/2021   CO2 29 06/11/2021   GLUCOSE 131 (H) 06/11/2021   BUN 16 06/11/2021   CREATININE 0.65 06/11/2021   BILITOT 0.6 06/05/2021   ALKPHOS 42 06/05/2021   AST 13 (L) 06/05/2021   ALT 9 06/05/2021   PROT 8.3 (H) 06/05/2021   ALBUMIN 3.3 (L) 06/05/2021   CALCIUM 8.6 (L) 06/11/2021   ANIONGAP 7 06/11/2021   Lab Results  Component Value Date   CHOL 146 11/24/2013   Lab  Results  Component Value Date   HDL 22 (L) 11/24/2013   Lab Results  Component Value Date   LDLCALC 94 11/24/2013   Lab Results  Component Value Date   TRIG 152 11/24/2013   No results found for: "CHOLHDL" Lab Results  Component Value Date   HGBA1C 6.5 10/11/2021      Assessment & Plan:   Problem List Items Addressed This Visit       Cardiovascular and Mediastinum   Essential hypertension - Primary     Patient denies any chest pain or shortness of breath there is no history of palpitation or paroxysmal nocturnal dyspnea   patient was advised to follow low-salt low-cholesterol diet    ideally I want to keep systolic blood pressure below 130 mmHg, patient was asked to check blood pressure one times a week and give me a report on that.  Patient will be follow-up in 3 months  or earlier as needed, patient will call me back for any change in the cardiovascular symptoms Patient was advised to buy a book from local bookstore concerning blood pressure and read several chapters  every day.  This will be supplemented by some of the material we will give him from the office.  Patient should also utilize other resources like YouTube and Internet to learn more about the blood pressure and the diet.  Respiratory   Sleep apnea    Patient is using the CPAP machine        Endocrine   Diabetes mellitus due to underlying condition without complication, without long-term current use of insulin ([name])     Other   Tobacco abuse    - I instructed the patient to stop smoking and provided them with smoking cessation materials.  - I informed the patient that smoking puts them at increased risk for cancer, COPD, hypertension, and more.  - Informed the patient to seek help if they begin to have trouble breathing, develop chest pain, start to cough up blood, feel faint, or pass out.       No orders of the defined types were placed in this encounter.   Follow-up: No follow-ups on file.     Cletis Athens, MD

## 2021-12-18 NOTE — Assessment & Plan Note (Signed)
Patient is using the CPAP machine

## 2021-12-18 NOTE — Assessment & Plan Note (Signed)

## 2021-12-19 ENCOUNTER — Ambulatory Visit: Payer: 59 | Admitting: Internal Medicine

## 2022-01-09 ENCOUNTER — Encounter: Payer: Self-pay | Admitting: Internal Medicine

## 2022-01-09 ENCOUNTER — Ambulatory Visit (INDEPENDENT_AMBULATORY_CARE_PROVIDER_SITE_OTHER): Payer: 59 | Admitting: Internal Medicine

## 2022-01-09 VITALS — BP 132/82 | Ht 74.0 in | Wt 292.0 lb

## 2022-01-09 DIAGNOSIS — R6511 Systemic inflammatory response syndrome (SIRS) of non-infectious origin with acute organ dysfunction: Secondary | ICD-10-CM | POA: Diagnosis not present

## 2022-01-09 DIAGNOSIS — E089 Diabetes mellitus due to underlying condition without complications: Secondary | ICD-10-CM

## 2022-01-09 DIAGNOSIS — G4733 Obstructive sleep apnea (adult) (pediatric): Secondary | ICD-10-CM

## 2022-01-09 DIAGNOSIS — S6991XA Unspecified injury of right wrist, hand and finger(s), initial encounter: Secondary | ICD-10-CM | POA: Diagnosis not present

## 2022-01-09 NOTE — Progress Notes (Signed)
Established Patient Office Visit  Subjective:  Patient ID: John [name]., male    DOB: February 06, 1975  Age: 47 y.o. MRN: [id number]  CC:  Chief Complaint  Patient presents with   Hand Injury    Right hand pain    Hand Injury     John [name]. presents for and general check up  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 tophus 07/20/2019   Morbid obesity ([place])    Paranoid schizophrenia ([place])    Personality disorder ([place])    Sleep apnea    Thyroid disease     Past Surgical History:  Procedure Laterality Date   FRACTURE SURGERY     KNEE ARTHROSCOPY Right 08/30/2020   Procedure: ARTHROSCOPY KNEE- I&D, PARTIAL LATERAL MENISCECTOMY;  Surgeon: [name], MD;  Location: [facility] ORS;  Service: Orthopedics;  Laterality: Right;    Family History  Problem Relation Age of Onset   Glaucoma Mother     Social History   Socioeconomic History   Marital status: Single    Spouse name: Not on file   Number of children: Not on file   Years of education: Not on file   Highest education level: Not on file  Occupational History   Not on file  Tobacco Use   Smoking status: Former   Smokeless tobacco: Never  Vaping Use   Vaping Use: Never used  Substance and Sexual Activity   Alcohol use: Not Currently   Drug use: No   Sexual activity: Not on file  Other Topics Concern   Not on file  Social History Narrative   Not on file   Social Determinants of Health   Financial Resource Strain: Low Risk  (10/27/2020)   Overall Financial Resource Strain (CARDIA)    Difficulty of Paying Living Expenses: Not hard at all  Food Insecurity: No Food Insecurity (10/27/2020)   Hunger Vital Sign    Worried About Running Out of Food in the Last Year: Never true    [place] in the Last Year: Never true  Transportation Needs: No Transportation Needs (10/27/2020)   PRAPARE - Civil engineer, contracting (Medical): No    Lack of Transportation (Non-Medical): No  Physical Activity: Sufficiently Active (10/27/2020)   Exercise Vital Sign    Days of Exercise per Week: 5 days    Minutes of Exercise per Session: 30 min  Stress: No Stress Concern Present (10/27/2020)   [place]    Feeling of Stress : Not at all  Social Connections: Socially Isolated (10/27/2020)   Social Connection and Isolation Panel [NHANES]    Frequency of Communication with Friends and Family: More than three times a week    Frequency of Social Gatherings with Friends and Family: More than three times a week    Attends Religious Services: Never    Marine scientist or Organizations: No    Attends Archivist Meetings: Never    Marital Status: Never married  Intimate Partner Violence: Not At Risk (10/27/2020)   Humiliation, Afraid, Rape, and Kick questionnaire    Fear of Current or Ex-Partner: No    Emotionally Abused: No    Physically Abused: No    Sexually Abused: No     Current Outpatient Medications:    ACCU-CHEK GUIDE test  strip, USE TO CHECK BLOOD SUGAR DAILY, Disp: 100 each, Rfl: 10   Accu-Chek Softclix Lancets lancets, USE TO CHECK BLOOD SUGAR DAILY, Disp: 100 each, Rfl: 12   albuterol (PROVENTIL) (2.5 MG/3ML) 0.083% nebulizer solution, USE 1 VIAL VIA NEBULIZER 3 TIMES A DAY, Disp: 300 mL, Rfl: 6   Alcohol Swabs (B-D SINGLE USE SWABS REGULAR) PADS, USE AS DIRECTED FOR BLOOD SUGAR CHECKS., Disp: 100 each, Rfl: 10   allopurinol (ZYLOPRIM) 100 MG tablet, Take 1 tablet (100 mg total) by mouth daily., Disp: 30 tablet, Rfl: 0   Asenapine Maleate 10 MG SUBL, Place 1 tablet under the tongue daily., Disp: , Rfl:    atenolol (TENORMIN) 100 MG tablet, TAKE 1 TABLET BY MOUTH DAILY, Disp: 30 tablet, Rfl: 3   atorvastatin (LIPITOR) 10 MG tablet, TAKE 1 TABLET BY MOUTH DAILY, Disp: 30 tablet, Rfl: 10   cloZAPine (CLOZARIL) 100  MG tablet, Take 150 mg by mouth at bedtime., Disp: , Rfl:    colchicine 0.6 MG tablet, Take 1 tablet (0.6 mg total) by mouth 2 (two) times daily., Disp: 60 tablet, Rfl: 0   divalproex (DEPAKOTE ER) 500 MG 24 hr tablet, Take 2,000 mg by mouth at bedtime. , Disp: , Rfl:    ELIQUIS 5 MG TABS tablet, TAKE 1 TABLET BY MOUTH TWICE DAILY, Disp: 60 tablet, Rfl: 0   levothyroxine (SYNTHROID) 88 MCG tablet, TAKE 1 TABLET BY MOUTH DAILY BEFORE BREAKFAST, Disp: 30 tablet, Rfl: 0   loratadine (CLARITIN) 10 MG tablet, TAKE 1 TABLET BY MOUTH DAILY, Disp: 30 tablet, Rfl: 4   metFORMIN (GLUCOPHAGE) 1000 MG tablet, TAKE 1 TABLET BY MOUTH TWICE A DAY WITH FOOD, Disp: 180 tablet, Rfl: 1   omeprazole (PRILOSEC) 20 MG capsule, TAKE 1 CAPSULE BY MOUTH DAILY, Disp: 30 capsule, Rfl: 6   oxybutynin (DITROPAN) 5 MG tablet, TAKE 1 TABLET BY MOUTH DAILY, Disp: 30 tablet, Rfl: 3   predniSONE (STERAPRED UNI-PAK 21 TAB) 10 MG (21) TBPK tablet, Take 6 pills on day one then decrease by 1 pill each day, Disp: 21 tablet, Rfl: 0   No Known Allergies  ROS Review of Systems  Constitutional: Negative.   HENT: Negative.    Eyes: Negative.   Respiratory: Negative.    Cardiovascular: Negative.   Gastrointestinal: Negative.   Endocrine: Negative.   Genitourinary: Negative.   Musculoskeletal: Negative.   Skin: Negative.   Allergic/Immunologic: Negative.   Neurological: Negative.   Hematological: Negative.   Psychiatric/Behavioral: Negative.    All other systems reviewed and are negative.     Objective:    Physical Exam Vitals reviewed.  Constitutional:      Appearance: Normal appearance.  HENT:     Mouth/Throat:     Mouth: Mucous membranes are moist.  Eyes:     Pupils: Pupils are equal, round, and reactive to light.  Neck:     Vascular: No carotid bruit.  Cardiovascular:     Rate and Rhythm: Normal rate and regular rhythm.     Pulses: Normal pulses.     Heart sounds: Normal heart sounds.  Pulmonary:      Effort: Pulmonary effort is normal.     Breath sounds: Normal breath sounds.  Abdominal:     General: Bowel sounds are normal.     Palpations: Abdomen is soft. There is no hepatomegaly, splenomegaly or mass.     Tenderness: There is no abdominal tenderness.     Hernia: No hernia is present.  Musculoskeletal:  Cervical back: Neck supple.     Right lower leg: No edema.     Left lower leg: No edema.     Comments: Rt hand is swollen  Skin:    Findings: No rash.  Neurological:     Mental Status: He is alert and oriented to person, place, and time.     Motor: No weakness.  Psychiatric:        Mood and Affect: Mood normal.        Behavior: Behavior normal.     BP 132/82   Ht '6\' 2"'$  (1.88 m)   Wt 292 lb (132.5 kg)   BMI 37.49 kg/m  Wt Readings from Last 3 Encounters:  01/09/22 292 lb (132.5 kg)  12/18/21 288 lb (130.6 kg)  10/11/21 291 lb 14.4 oz (132.4 kg)     Health Maintenance Due  Topic Date Due   Diabetic kidney evaluation - Urine ACR  Never done   DTaP/Tdap/Td (2 - Tdap) 06/24/2005   OPHTHALMOLOGY EXAM  02/01/2021   COVID-19 Vaccine (3 - 2023-24 season) 09/01/2021   Medicare Annual Wellness (AWV)  10/27/2021   FOOT EXAM  11/11/2021    There are no preventive care reminders to display for this patient.  Lab Results  Component Value Date   TSH 5.613 (H) 06/05/2021   Lab Results  Component Value Date   WBC 5.6 06/11/2021   HGB 9.8 (L) 06/11/2021   HCT 33.2 (L) 06/11/2021   MCV 86.5 06/11/2021   PLT 250 06/11/2021   Lab Results  Component Value Date   NA 136 06/11/2021   K 4.1 06/11/2021   CO2 29 06/11/2021   GLUCOSE 131 (H) 06/11/2021   BUN 16 06/11/2021   CREATININE 0.65 06/11/2021   BILITOT 0.6 06/05/2021   ALKPHOS 42 06/05/2021   AST 13 (L) 06/05/2021   ALT 9 06/05/2021   PROT 8.3 (H) 06/05/2021   ALBUMIN 3.3 (L) 06/05/2021   CALCIUM 8.6 (L) 06/11/2021   ANIONGAP 7 06/11/2021   Lab Results  Component Value Date   CHOL 146 11/24/2013    Lab Results  Component Value Date   HDL 22 (L) 11/24/2013   Lab Results  Component Value Date   LDLCALC 94 11/24/2013   Lab Results  Component Value Date   TRIG 152 11/24/2013   No results found for: "CHOLHDL" Lab Results  Component Value Date   HGBA1C 6.5 10/11/2021      Assessment & Plan:   Problem List Items Addressed This Visit       Respiratory   OSA treated with BiPAP - Primary    Ref resp therapy        Endocrine   Diabetes mellitus due to underlying condition without complication, without long-term current use of insulin ([place])    Bs is 116        Other   SIRS of non-infectious origin w acute organ dysfunction ([place])    Ref to ortho      Wrist injury, right, initial encounter    No orders of the defined types were placed in this encounter.   Follow-up: No follow-ups on file.    Cletis Athens, MD

## 2022-01-09 NOTE — Assessment & Plan Note (Signed)
Bs is 116

## 2022-01-09 NOTE — Assessment & Plan Note (Signed)
Ref to ortho

## 2022-01-09 NOTE — Assessment & Plan Note (Signed)
Ref resp therapy

## 2022-01-18 ENCOUNTER — Other Ambulatory Visit: Payer: Self-pay | Admitting: Internal Medicine

## 2022-06-08 ENCOUNTER — Emergency Department
Admission: EM | Admit: 2022-06-08 | Discharge: 2022-06-09 | Disposition: A | Discharge status: Home / Self Care | Payer: 59 | Attending: Emergency Medicine | Admitting: Emergency Medicine

## 2022-06-08 ENCOUNTER — Other Ambulatory Visit: Payer: Self-pay

## 2022-06-08 DIAGNOSIS — M25571 Pain in right ankle and joints of right foot: Secondary | ICD-10-CM | POA: Diagnosis present

## 2022-06-08 DIAGNOSIS — M25471 Effusion, right ankle: Secondary | ICD-10-CM | POA: Diagnosis not present

## 2022-06-08 DIAGNOSIS — R000 Tachycardia, unspecified: Secondary | ICD-10-CM | POA: Diagnosis not present

## 2022-06-08 DIAGNOSIS — M25472 Effusion, left ankle: Secondary | ICD-10-CM | POA: Insufficient documentation

## 2022-06-08 DIAGNOSIS — M2550 Pain in unspecified joint: Secondary | ICD-10-CM | POA: Diagnosis not present

## 2022-06-08 LAB — CBC
HCT: 38.4 % — ABNORMAL LOW (ref 39.0–52.0)
Hemoglobin: 11.6 g/dL — ABNORMAL LOW (ref 13.0–17.0)
MCH: 25.7 pg — ABNORMAL LOW (ref 26.0–34.0)
MCHC: 30.2 g/dL (ref 30.0–36.0)
MCV: 85.1 fL (ref 80.0–100.0)
Platelets: 231 10*3/uL (ref 150–400)
RBC: 4.51 MIL/uL (ref 4.22–5.81)
RDW: 14.7 % (ref 11.5–15.5)
WBC: 10 10*3/uL (ref 4.0–10.5)
nRBC: 0 % (ref 0.0–0.2)

## 2022-06-08 LAB — BASIC METABOLIC PANEL
Anion gap: 12 (ref 5–15)
BUN: 10 mg/dL (ref 6–20)
CO2: 23 mmol/L (ref 22–32)
Calcium: 8.3 mg/dL — ABNORMAL LOW (ref 8.9–10.3)
Chloride: 101 mmol/L (ref 98–111)
Creatinine, Ser: 0.91 mg/dL (ref 0.61–1.24)
GFR, Estimated: >60 mL/min (ref >60)
Glucose, Bld: 160 mg/dL — ABNORMAL HIGH (ref 70–99)
Potassium: 3.8 mmol/L (ref 3.5–5.1)
Sodium: 136 mmol/L (ref 135–145)

## 2022-06-08 LAB — TROPONIN I (HIGH SENSITIVITY): Troponin I (High Sensitivity): 6 ng/L (ref <18)

## 2022-06-08 MED ORDER — SODIUM CHLORIDE 0.9 % IV BOLUS
1000.0000 mL | Freq: Once | INTRAVENOUS | Status: AC
  Administered 2022-06-08: 1000 mL via INTRAVENOUS

## 2022-06-08 MED ORDER — FENTANYL CITRATE PF 50 MCG/ML IJ SOSY
50.0000 ug | PREFILLED_SYRINGE | Freq: Once | INTRAMUSCULAR | Status: AC
  Administered 2022-06-08: 50 ug via INTRAVENOUS
  Filled 2022-06-08: qty 1

## 2022-06-08 NOTE — ED Triage Notes (Signed)
Pt to ed from group home via [facility] for ambulation issues. Pt has gout history, hasn't taken his meds in days.  Pt urinated on himself in triage.   20 LH 500 cc of NS due to high HR of 140.   Pt is caox4, in no acute distress at this time.

## 2022-06-08 NOTE — ED Notes (Signed)
Pt seen sitting in lobby with blood dripping from his former IV site. Old IV disposed of in sharps container and IV site cleaned, then wrapped with gauze and co band. Pt removed the IV himself stating "I pulled it apart. I just didn't like it."   Pt roomed to 18H, provided a warm blanket and positioned in bed for comfort. Bed locked and both side rails raised for pt safety.

## 2022-06-08 NOTE — ED Provider Notes (Signed)
[facility] Provider Note    Event Date/Time   First MD Initiated Contact with Patient 06/08/22 2145     (approximate)   History   Foot Pain (Bilateral x3 days)   HPI  John [name]. is a 47 y.o. male who presents to the emergency department today with concerns for pain in the ankles, elbows.  Has history of gout.  This feels similar to previous gouty flares.  He says he has not been taking his medication.  He denies any fevers.  Denies any shortness of breath or chest pain.     Physical Exam   Triage Vital Signs: ED Triage Vitals  Enc Vitals Group     BP 06/08/22 1903 118/81     Pulse Rate 06/08/22 1903 (!) 150     Resp 06/08/22 1903 16     Temp 06/08/22 1903 98.7 F (37.1 C)     Temp Source 06/08/22 1903 Oral     SpO2 06/08/22 1903 96 %     Weight 06/08/22 1901 295 lb 6.7 oz (134 kg)     Height 06/08/22 1901 6\' 2"  (1.88 m)     Head Circumference --      Peak Flow --      Pain Score 06/08/22 1900 10     Pain Loc --      Pain Edu? --      Excl. in GC? --     Most recent vital signs: Vitals:   06/08/22 1903  BP: 118/81  Pulse: (!) 150  Resp: 16  Temp: 98.7 F (37.1 C)  SpO2: 96%   General: Awake, alert, oriented. CV:  Good peripheral perfusion. Tachycardia Resp:  Normal effort. Lungs clear. Abd:  No distention.  Other:  Bilateral ankles with some swelling, right greater than left, full ROM of elbows. No significant swelling.    ED Results / Procedures / Treatments   Labs (all labs ordered are listed, but only abnormal results are displayed) Labs Reviewed  BASIC METABOLIC PANEL - Abnormal; Notable for the following components:      Result Value   Glucose, Bld 160 (*)    Calcium 8.3 (*)    All other components within normal limits  CBC - Abnormal; Notable for the following components:   Hemoglobin 11.6 (*)    HCT 38.4 (*)    MCH 25.7 (*)    All other components within normal limits  TROPONIN I (HIGH SENSITIVITY)   TROPONIN I (HIGH SENSITIVITY)     EKG  I, Phineas Semen, attending physician, personally viewed and interpreted this EKG  EKG Time: 1906 Rate: 149 Rhythm: sinus tachycardia Axis: normal Intervals: qtc 532 QRS: narrow ST changes: no st elevation Impression: abnormal ekg    RADIOLOGY None  PROCEDURES:  Critical Care performed: No   MEDICATIONS ORDERED IN ED: Medications - No data to display   IMPRESSION / MDM / ASSESSMENT AND PLAN / ED COURSE  I reviewed the triage vital signs and the nursing notes.                              Differential diagnosis includes, but is not limited to, gout, infection, anemia  Patient's presentation is most consistent with acute presentation with potential threat to life or bodily function.   Patient presents to the emergency department today because of concern for joint pain. Patient has history of gout and feels this is  similar. Have low concern for septic joint at this time. No leukocytosis. Of concern the patient's heart rate was elevated. Per review of EKGs in the past patient typically does have some tachycardia. Will give IVFs and pain medication and reassess heart rate. If heart rate improves feel patient could likely be discharged with gout treatment.    FINAL CLINICAL IMPRESSION(S) / ED DIAGNOSES   Final diagnoses:  Arthralgia, unspecified joint  Tachycardia    Note:  This document was prepared using Dragon voice recognition software and may include unintentional dictation errors.    Phineas Semen, MD 06/08/22 [phone]

## 2022-06-09 DIAGNOSIS — M2550 Pain in unspecified joint: Secondary | ICD-10-CM | POA: Diagnosis not present

## 2022-06-09 MED ORDER — SODIUM CHLORIDE 0.9 % IV BOLUS
1000.0000 mL | Freq: Once | INTRAVENOUS | Status: AC
  Administered 2022-06-09: 1000 mL via INTRAVENOUS

## 2022-06-09 MED ORDER — COLCHICINE 0.6 MG PO TABS: 0.6000 mg | ORAL_TABLET | Freq: Two times a day (BID) | ORAL | 0 refills | Status: AC

## 2022-06-09 MED ORDER — HYDROCODONE-ACETAMINOPHEN 5-325 MG PO TABS: 1.0000 | ORAL_TABLET | Freq: Four times a day (QID) | ORAL | 0 refills | Status: AC | PRN

## 2022-06-09 MED ORDER — KETOROLAC TROMETHAMINE 30 MG/ML IJ SOLN
15.0000 mg | Freq: Once | INTRAMUSCULAR | Status: AC
  Administered 2022-06-09: 15 mg via INTRAVENOUS
  Filled 2022-06-09: qty 1

## 2022-06-09 NOTE — ED Notes (Signed)
[facility] CONTACTED TO INFORM OF DISCHARGE AND REPORTS THAT THEY WILL BE SENDING SOMEONE TO PICK PT UP WITHIN THE NEXT 20-30 MINS. DISCHARGE INFORMATION GONE OVER WITH CALL AS WELL AS PRESCRIPTION AND FOLLOW UP INSTRUCTIONS.

## 2022-06-09 NOTE — Discharge Instructions (Signed)
Take pain medicines as needed.  Return to the ER for worsening symptoms, persistent vomiting, difficulty breathing or other concerns.

## 2022-06-09 NOTE — ED Provider Notes (Signed)
-----------------------------------------   12:20 AM on 06/09/2022 -----------------------------------------   HR improved to 120.  Patient resting in no acute distress.  Will administer second liter IV fluids, administer IV ketorolac and reassess.   ----------------------------------------- 1:36 AM on 06/09/2022 -----------------------------------------   Heart rate 105.  Patient has had several prior EKGs with documented heart rates in the 120s.  Pain significantly better.  Will discharge back to facility analgesia.  Will refer to cardiology for further workup of tachycardia.  Strict return precautions given. Patient verbalizes understand and agrees with plan of care.   Irean Hong, MD 06/09/22 [phone]

## 2022-06-12 ENCOUNTER — Emergency Department
Admission: EM | Admit: 2022-06-12 | Discharge: 2022-06-15 | Disposition: A | Discharge status: Home / Self Care | Payer: 59 | Attending: Emergency Medicine | Admitting: Emergency Medicine

## 2022-06-12 ENCOUNTER — Encounter: Payer: Self-pay | Admitting: Emergency Medicine

## 2022-06-12 ENCOUNTER — Emergency Department: Payer: 59

## 2022-06-12 ENCOUNTER — Other Ambulatory Visit: Payer: Self-pay

## 2022-06-12 DIAGNOSIS — I10 Essential (primary) hypertension: Secondary | ICD-10-CM | POA: Diagnosis not present

## 2022-06-12 DIAGNOSIS — M7121 Synovial cyst of popliteal space [Baker], right knee: Secondary | ICD-10-CM | POA: Diagnosis not present

## 2022-06-12 DIAGNOSIS — R531 Weakness: Secondary | ICD-10-CM | POA: Insufficient documentation

## 2022-06-12 DIAGNOSIS — R262 Difficulty in walking, not elsewhere classified: Secondary | ICD-10-CM | POA: Insufficient documentation

## 2022-06-12 DIAGNOSIS — E039 Hypothyroidism, unspecified: Secondary | ICD-10-CM | POA: Diagnosis not present

## 2022-06-12 DIAGNOSIS — F17200 Nicotine dependence, unspecified, uncomplicated: Secondary | ICD-10-CM | POA: Diagnosis not present

## 2022-06-12 DIAGNOSIS — R9431 Abnormal electrocardiogram [ECG] [EKG]: Secondary | ICD-10-CM | POA: Insufficient documentation

## 2022-06-12 DIAGNOSIS — E119 Type 2 diabetes mellitus without complications: Secondary | ICD-10-CM | POA: Insufficient documentation

## 2022-06-12 DIAGNOSIS — M7989 Other specified soft tissue disorders: Secondary | ICD-10-CM | POA: Diagnosis present

## 2022-06-12 DIAGNOSIS — Z86718 Personal history of other venous thrombosis and embolism: Secondary | ICD-10-CM | POA: Insufficient documentation

## 2022-06-12 DIAGNOSIS — E669 Obesity, unspecified: Secondary | ICD-10-CM | POA: Insufficient documentation

## 2022-06-12 LAB — CBC WITH DIFFERENTIAL/PLATELET
Abs Immature Granulocytes: 0.06 10*3/uL (ref 0.00–0.07)
Basophils Absolute: 0 10*3/uL (ref 0.0–0.1)
Basophils Relative: 0 %
Eosinophils Absolute: 0.1 10*3/uL (ref 0.0–0.5)
Eosinophils Relative: 1 %
HCT: 35.6 % — ABNORMAL LOW (ref 39.0–52.0)
Hemoglobin: 10.4 g/dL — ABNORMAL LOW (ref 13.0–17.0)
Immature Granulocytes: 1 %
Lymphocytes Relative: 13 %
Lymphs Abs: 1.1 10*3/uL (ref 0.7–4.0)
MCH: 25.6 pg — ABNORMAL LOW (ref 26.0–34.0)
MCHC: 29.2 g/dL — ABNORMAL LOW (ref 30.0–36.0)
MCV: 87.7 fL (ref 80.0–100.0)
Monocytes Absolute: 0.8 10*3/uL (ref 0.1–1.0)
Monocytes Relative: 9 %
Neutro Abs: 6.1 10*3/uL (ref 1.7–7.7)
Neutrophils Relative %: 76 %
Platelets: 294 10*3/uL (ref 150–400)
RBC: 4.06 MIL/uL — ABNORMAL LOW (ref 4.22–5.81)
RDW: 15.6 % — ABNORMAL HIGH (ref 11.5–15.5)
WBC: 8.1 10*3/uL (ref 4.0–10.5)
nRBC: 0 % (ref 0.0–0.2)

## 2022-06-12 LAB — URINALYSIS, W/ REFLEX TO CULTURE (INFECTION SUSPECTED)
Bilirubin Urine: NEGATIVE
Glucose, UA: NEGATIVE mg/dL
Hgb urine dipstick: NEGATIVE
Ketones, ur: 5 mg/dL — AB
Leukocytes,Ua: NEGATIVE
Nitrite: NEGATIVE
Protein, ur: 100 mg/dL — AB
Specific Gravity, Urine: 1.024 (ref 1.005–1.030)
pH: 5 (ref 5.0–8.0)

## 2022-06-12 LAB — BASIC METABOLIC PANEL
Anion gap: 10 (ref 5–15)
BUN: 16 mg/dL (ref 6–20)
CO2: 28 mmol/L (ref 22–32)
Calcium: 8.3 mg/dL — ABNORMAL LOW (ref 8.9–10.3)
Chloride: 97 mmol/L — ABNORMAL LOW (ref 98–111)
Creatinine, Ser: 0.76 mg/dL (ref 0.61–1.24)
GFR, Estimated: >60 mL/min (ref >60)
Glucose, Bld: 101 mg/dL — ABNORMAL HIGH (ref 70–99)
Potassium: 3.8 mmol/L (ref 3.5–5.1)
Sodium: 135 mmol/L (ref 135–145)

## 2022-06-12 LAB — BRAIN NATRIURETIC PEPTIDE: B Natriuretic Peptide: 58.6 pg/mL (ref 0.0–100.0)

## 2022-06-12 LAB — URIC ACID: Uric Acid, Serum: 8.3 mg/dL (ref 3.7–8.6)

## 2022-06-12 MED ORDER — KETOROLAC TROMETHAMINE 15 MG/ML IJ SOLN
15.0000 mg | Freq: Once | INTRAMUSCULAR | Status: AC
  Administered 2022-06-12: 15 mg via INTRAMUSCULAR
  Filled 2022-06-12: qty 1

## 2022-06-12 NOTE — ED Triage Notes (Signed)
[facility].  C/O gout pain.  VS wnl.

## 2022-06-12 NOTE — ED Notes (Signed)
Group Home worker Wyonia Hough was contacted [phone] via phone and advised the pt was not able to ambulate without assistance and appeared very weak when standing. The staff member was advised the pt was now going to be a border. Mr. Lodema Hong advised he would come back tomorrow to visit the pt.

## 2022-06-12 NOTE — ED Provider Notes (Signed)
[facility] Provider Note    Event Date/Time   First MD Initiated Contact with Patient 06/12/22 1314     (approximate)   History   Weakness   HPI  John [name]. is a 47 y.o. male with a past medical history of DVT, hypertension, gout, diabetes, schizophrenia, obesity who presents today for evaluation of bilateral leg swelling though right greater than left.  Patient reports that this has been ongoing for approximately 1 week.  He reports that it has happened before.  He denies any injury.  He denies calf pain.  He denies chest pain or shortness of breath.  No fevers or chills.  Patient Active Problem List   Diagnosis Date Noted   Wrist injury, right, initial encounter 01/09/2022   Acute deep vein thrombosis (DVT) of left upper extremity ([name]) 06/06/2021   SIRS of non-infectious origin w acute organ dysfunction ([name]) 06/06/2021   SIRS (systemic inflammatory response syndrome) ([name]) 06/06/2021   Gout    Hyperlipidemia    Community acquired pneumonia 08/29/2020   Bronchitis 08/23/2020   Idiopathic sleep related nonobstructive alveolar hypoventilation 08/23/2020   Sore throat 08/23/2020   OSA treated with BiPAP 08/08/2020   Obesity (BMI 30-39.9) 08/08/2020   OSA on CPAP 05/23/2020   CPAP use counseling 05/23/2020   BMI 36.0-36.9,adult 05/23/2020   Bilateral leg weakness 08/17/2019   Knee arthropathy 08/10/2019   Acute gout due to renal impairment involving right foot 07/08/2019   Encephalopathy acute    Somnolence 11/01/2016   AKI (acute kidney injury) ([name]) 11/01/2016   Sepsis, possible([name]) 04/27/2016   Tobacco abuse 11/28/2015   Sleep apnea    Essential hypertension 11/25/2015   Hypothyroidism 11/25/2015   Paranoid schizophrenia, chronic condition ([name]) 11/25/2015   Diabetes mellitus ([name])    Diabetes mellitus due to underlying condition without complication, without long-term current use of insulin [facility])           Physical Exam    Triage Vital Signs: ED Triage Vitals  Enc Vitals Group     BP 06/12/22 1214 114/88     Pulse Rate 06/12/22 1214 (!) 101     Resp 06/12/22 1214 18     Temp 06/12/22 1214 (!) 97 F (36.1 C)     Temp Source 06/12/22 1214 Oral     SpO2 06/12/22 1214 99 %     Weight 06/12/22 1158 295 lb 6.7 oz (134 kg)     Height 06/12/22 1158 6\' 2"  (1.88 m)     Head Circumference --      Peak Flow --      Pain Score 06/12/22 1158 7     Pain Loc --      Pain Edu? --      Excl. in GC? --     Most recent vital signs: Vitals:   06/12/22 1214 06/12/22 1648  BP: 114/88 126/65  Pulse: (!) 101 94  Resp: 18 18  Temp: (!) 97 F (36.1 C) 98.1 F (36.7 C)  SpO2: 99% 100%    Physical Exam Vitals and nursing note reviewed.  Constitutional:      General: Awake and alert. No acute distress.    Appearance: Normal appearance. The patient is obese.  HENT:     Head: Normocephalic and atraumatic.     Mouth: Mucous membranes are moist.  Eyes:     General: PERRL. Normal EOMs        Right eye: No discharge.  Left eye: No discharge.     Conjunctiva/sclera: Conjunctivae normal.  Cardiovascular:     Rate and Rhythm: Normal rate and regular rhythm.     Pulses: Normal pulses.  No JVD Pulmonary:     Effort: Pulmonary effort is normal. No respiratory distress.     Breath sounds: Normal breath sounds.  Abdominal:     Abdomen is soft. There is no abdominal tenderness. No rebound or guarding. No distention. Musculoskeletal:        General: No swelling. Normal range of motion.     Cervical back: Normal range of motion and neck supple.  Chronic skin changes noted to bilateral feet and ankles.  Mild swelling noted to right ankle.  No erythema.  Normal range of motion of all toes and ankle.  Normal distal pulses bilaterally.  Normal capillary refill.  No pitting edema or tenderness to calves. Skin:    General: Skin is warm and dry.     Capillary Refill: Capillary refill takes less than 2 seconds.      Findings: No rash.  Neurological:     Mental Status: The patient is awake and alert.      ED Results / Procedures / Treatments   Labs (all labs ordered are listed, but only abnormal results are displayed) Labs Reviewed  CBC WITH DIFFERENTIAL/PLATELET - Abnormal; Notable for the following components:      Result Value   RBC 4.06 (*)    Hemoglobin 10.4 (*)    HCT 35.6 (*)    MCH 25.6 (*)    MCHC 29.2 (*)    RDW 15.6 (*)    All other components within normal limits  BASIC METABOLIC PANEL - Abnormal; Notable for the following components:   Chloride 97 (*)    Glucose, Bld 101 (*)    Calcium 8.3 (*)    All other components within normal limits  URINALYSIS, W/ REFLEX TO CULTURE (INFECTION SUSPECTED) - Abnormal; Notable for the following components:   Color, Urine YELLOW (*)    APPearance CLEAR (*)    Ketones, ur 5 (*)    Protein, ur 100 (*)    Bacteria, UA RARE (*)    All other components within normal limits  BRAIN NATRIURETIC PEPTIDE  URIC ACID     EKG     RADIOLOGY I independently reviewed and interpreted imaging and agree with radiologists findings.     PROCEDURES:  Critical Care performed:   Procedures   MEDICATIONS ORDERED IN ED: Medications  ketorolac (TORADOL) 15 MG/ML injection 15 mg (15 mg Intramuscular Given 06/12/22 1812)     IMPRESSION / MDM / ASSESSMENT AND PLAN / ED COURSE  I reviewed the triage vital signs and the nursing notes.   Differential diagnosis includes, but is not limited to, DVT, heart failure, dependent edema, gout.  Patient is awake and alert, hemodynamically stable and afebrile.    I reviewed the patient's chart.  Patient was seen in the emergency department 4 days ago with similar complaint.  At that time he was tachycardic to 120.  He was hydrated with IV fluids and was referred to cardiology for further workup of tachycardia.  He was ultimately discharged.  Patient is able to lift bilateral legs up off of the stretcher.   He has full normal range of motion of bilateral hips, knees, ankles.  His right leg is more swollen than his left leg, though there is no erythema, warmth, or signs of infection.  Compartments are soft and compressible,  no evidence of compartment syndrome.  No vertebral tenderness, no saddle anesthesia, normal strength and sensation in lower extremities, do not suspect cauda equina/cord compression.  Labs obtained are overall reassuring.  BNP is normal, no JVD, no crackles, no history of heart failure. Ultrasound of his right lower extremity obtained reveals a 4 cm Baker's cyst.  This is the location of the patient's pain.  He was given an Ace wrap for extra support.  When the RNs attempted to ambulate the patient, he was unable to take a step.  The group home is unable to take him back if he is unable to ambulate.  Patient was treated symptomatically with Toradol, and upon reattempted ambulation patient was still unable to take a step.  He is able to stand but will not take any steps because he feels he is unable to.  Dr. Cyril Loosen came to evaluate the patient as well.  Patient was made boarding status, plan for PT/TOC in the morning.  Group home made aware by [facility] paramedic.  Patient's presentation is most consistent with acute presentation with potential threat to life or bodily function.   Clinical Course as of 06/12/22 1847  Tue Jun 12, 2022  1843 Charge advised of boarding status [JP]    Clinical Course User Index [JP] Tyffani Foglesong, Herb Grays, PA-C     FINAL CLINICAL IMPRESSION(S) / ED DIAGNOSES   Final diagnoses:  Baker's cyst of knee, right  Generalized weakness  Inability to ambulate due to right ankle or foot     Rx / DC Orders   ED Discharge Orders     None        Note:  This document was prepared using Dragon voice recognition software and may include unintentional dictation errors.   Keturah Shavers 06/12/22 1847    Jene Every, MD 06/12/22 [phone]

## 2022-06-12 NOTE — Discharge Instructions (Addendum)
Your blood work was normal.  Your ultrasound reveals a Baker's cyst.  This is likely the cause of your swelling in your leg.  You may follow-up with orthopedics if your symptoms persist.  Please return for any new, worsening, or change in symptoms or other concerns.

## 2022-06-13 DIAGNOSIS — M7121 Synovial cyst of popliteal space [Baker], right knee: Secondary | ICD-10-CM | POA: Diagnosis not present

## 2022-06-13 MED ORDER — GUAIFENESIN 100 MG/5ML PO LIQD
15.0000 mL | Freq: Once | ORAL | Status: AC
  Administered 2022-06-13: 15 mL via ORAL
  Filled 2022-06-13: qty 20

## 2022-06-13 NOTE — ED Notes (Addendum)
Pt given phone to talk with mom at this time, pt denies needs

## 2022-06-13 NOTE — Evaluation (Signed)
Physical Therapy Evaluation Patient Details Name: John [name]. MRN: [id number] DOB: 12-03-1975 Today's Date: 06/13/2022  History of Present Illness  John Geng. is a 47 y.o. male with a past medical history of DVT, hypertension, gout, diabetes, schizophrenia, obesity who presents today for evaluation of bilateral leg swelling though right greater than left.  Patient reports that this has been ongoing for approximately 1 week.  Clinical Impression  Patient received on stretcher in ED in hallway. He is pleasant and agrees to PT assessment. Patient is mod I with bed mobility. Reports pain in B feet. R more than L. He believes this is gout flare. Patient is able to stand, but has difficulty attempting to shift weight in standing. Unable to walk at this time with RW. Patient will continue to benefit from skilled PT to improve functional mobility.           Recommendations for follow up therapy are one component of a multi-disciplinary discharge planning process, led by the attending physician.  Recommendations may be updated based on patient status, additional functional criteria and insurance authorization.  Follow Up Recommendations       Assistance Recommended at Discharge Intermittent Supervision/Assistance  Patient can return home with the following  A lot of help with walking and/or transfers;A little help with bathing/dressing/bathroom;Help with stairs or ramp for entrance;Assist for transportation    Equipment Recommendations None recommended by PT  Recommendations for Other Services       Functional Status Assessment Patient has had a recent decline in their functional status and demonstrates the ability to make significant improvements in function in a reasonable and predictable amount of time.     Precautions / Restrictions Precautions Precautions: Fall Restrictions Weight Bearing Restrictions: No      Mobility  Bed Mobility Overal bed mobility: Modified  Independent                  Transfers Overall transfer level: Needs assistance Equipment used: Rolling walker (2 wheels) Transfers: Sit to/from Stand Sit to Stand: Min assist, +2 physical assistance                Ambulation/Gait     Assistive device: Rolling walker (2 wheels) Gait Pattern/deviations: Step-to pattern Gait velocity: decr     General Gait Details: patient is unable to ambulate at this time due to reported B foot pain. He took 2 very small lateral steps at edge of bed.  Stairs            Wheelchair Mobility    Modified Rankin (Stroke Patients Only)       Balance Overall balance assessment: Needs assistance Sitting-balance support: Feet supported Sitting balance-Leahy Scale: Good     Standing balance support: Bilateral upper extremity supported, During functional activity, Reliant on assistive device for balance Standing balance-Leahy Scale: Poor                               Pertinent Vitals/Pain Pain Assessment Pain Assessment: Faces Faces Pain Scale: Hurts little more Pain Location: B feet, R more than L Pain Descriptors / Indicators: Discomfort, Sore, Grimacing, Guarding Pain Intervention(s): Monitored during session    Home Living Family/patient expects to be discharged to:: Group home                   Additional Comments: Facility is single level    Prior Function Prior Level of Function :  Independent/Modified Independent             Mobility Comments: Uses Rolling walker at baseline       Hand Dominance   Dominant Hand: Right    Extremity/Trunk Assessment   Upper Extremity Assessment Upper Extremity Assessment: Overall WFL for tasks assessed    Lower Extremity Assessment Lower Extremity Assessment: RLE deficits/detail;LLE deficits/detail RLE: Unable to fully assess due to pain RLE Coordination: decreased gross motor LLE: Unable to fully assess due to pain LLE Coordination:  decreased gross motor    Cervical / Trunk Assessment Cervical / Trunk Assessment: Normal  Communication   Communication: No difficulties  Cognition Arousal/Alertness: Awake/alert Behavior During Therapy: WFL for tasks assessed/performed Overall Cognitive Status: Within Functional Limits for tasks assessed                                          General Comments      Exercises     Assessment/Plan    PT Assessment Patient needs continued PT services  PT Problem List Decreased activity tolerance;Decreased mobility;Pain       PT Treatment Interventions DME instruction;Gait training;Functional mobility training;Therapeutic activities;Patient/family education    PT Goals (Current goals can be found in the Care Plan section)  Acute Rehab PT Goals Patient Stated Goal: to decrease pain in feet PT Goal Formulation: With patient Time For Goal Achievement: 06/27/22 Potential to Achieve Goals: Good    Frequency Min 2X/week     Co-evaluation               AM-PAC PT "6 Clicks" Mobility  Outcome Measure Help needed turning from your back to your side while in a flat bed without using bedrails?: None Help needed moving from lying on your back to sitting on the side of a flat bed without using bedrails?: None Help needed moving to and from a bed to a chair (including a wheelchair)?: A Lot Help needed standing up from a chair using your arms (e.g., wheelchair or bedside chair)?: A Lot Help needed to walk in hospital room?: Total Help needed climbing 3-5 steps with a railing? : Total 6 Click Score: 14    End of Session Equipment Utilized During Treatment: Gait belt Activity Tolerance: Patient limited by pain Patient left: in bed Nurse Communication: Mobility status PT Visit Diagnosis: Other abnormalities of gait and mobility (R26.89);Pain;Difficulty in walking, not elsewhere classified (R26.2) Pain - Right/Left:  (B) Pain - part of body: Ankle and joints  of foot    Time: 1610-9604 PT Time Calculation (min) (ACUTE ONLY): 8 min   Charges:   PT Evaluation $PT Eval Moderate Complexity: 1 Mod          Davy Westmoreland, PT, GCS 06/13/22,11:14 AM

## 2022-06-13 NOTE — ED Notes (Signed)
Pt is able to transfer to wheel chair with a two person assist. Pt had BM and urinated in bathroom.

## 2022-06-13 NOTE — ED Notes (Signed)
Spoke with pts mother at this time, she states that the [facility] has been in contact with her and stated that if the pt cannot walk he cannot return there. Pts mother gave this RN number to call for social worker to get in contact with them, [phone]. Mother states she spoke with [facility] Emergency planning/management officer)

## 2022-06-13 NOTE — ED Provider Notes (Signed)
-----------------------------------------   6:57 AM on 06/13/2022 -----------------------------------------   Blood pressure 126/65, pulse 94, temperature 98.1 F (36.7 C), temperature source Oral, resp. rate 18, height 6\' 2"  (1.88 m), weight 134 kg, SpO2 100 %.  The patient is calm and cooperative at this time.  There have been no acute events since the last update.  Awaiting disposition plan from Social Work team.   Irean Hong, MD 06/13/22 [phone]

## 2022-06-13 NOTE — Progress Notes (Signed)
CSW has called all numbers listed in patient contact sheet, no answers. CSW has called and lvm with Wyonia Hough at [phone] with the group home inquiring if patient can return.   Pending call backs, CSW has updated TOC supervisor of barriers.   Darolyn Rua, [place], MSW, [place] [phone]

## 2022-06-14 DIAGNOSIS — M7121 Synovial cyst of popliteal space [Baker], right knee: Secondary | ICD-10-CM | POA: Diagnosis not present

## 2022-06-14 MED ORDER — HYDROCODONE-ACETAMINOPHEN 5-325 MG PO TABS: 1.0000 | ORAL_TABLET | Freq: Four times a day (QID) | ORAL | Status: DC | PRN

## 2022-06-14 MED ORDER — METFORMIN HCL 500 MG PO TABS
1000.0000 mg | ORAL_TABLET | Freq: Two times a day (BID) | ORAL | Status: DC
  Administered 2022-06-15 (×2): 1000 mg via ORAL
  Filled 2022-06-14 (×2): qty 2

## 2022-06-14 MED ORDER — LEVOTHYROXINE SODIUM 88 MCG PO TABS
88.0000 ug | ORAL_TABLET | Freq: Every day | ORAL | Status: DC
  Administered 2022-06-15: 88 ug via ORAL
  Filled 2022-06-14: qty 1

## 2022-06-14 MED ORDER — DIVALPROEX SODIUM ER 500 MG PO TB24
2000.0000 mg | ORAL_TABLET | Freq: Every day | ORAL | Status: DC
  Administered 2022-06-14: 2000 mg via ORAL
  Filled 2022-06-14 (×2): qty 4
  Filled 2022-06-14: qty 8

## 2022-06-14 MED ORDER — ALLOPURINOL 100 MG PO TABS: 100.0000 mg | ORAL_TABLET | Freq: Every day | ORAL | Status: DC

## 2022-06-14 MED ORDER — ATORVASTATIN CALCIUM 20 MG PO TABS
10.0000 mg | ORAL_TABLET | Freq: Every day | ORAL | Status: DC
  Administered 2022-06-14 – 2022-06-15 (×2): 10 mg via ORAL
  Filled 2022-06-14 (×2): qty 1

## 2022-06-14 MED ORDER — OXYBUTYNIN CHLORIDE 5 MG PO TABS
5.0000 mg | ORAL_TABLET | Freq: Every day | ORAL | Status: DC
  Administered 2022-06-15: 5 mg via ORAL
  Filled 2022-06-14: qty 1

## 2022-06-14 MED ORDER — CLOZAPINE 100 MG PO TABS
150.0000 mg | ORAL_TABLET | Freq: Every day | ORAL | Status: DC
  Administered 2022-06-14: 150 mg via ORAL
  Filled 2022-06-14: qty 2

## 2022-06-14 MED ORDER — APIXABAN 5 MG PO TABS
5.0000 mg | ORAL_TABLET | Freq: Two times a day (BID) | ORAL | Status: DC
  Administered 2022-06-14 – 2022-06-15 (×2): 5 mg via ORAL
  Filled 2022-06-14 (×2): qty 1

## 2022-06-14 MED ORDER — PANTOPRAZOLE SODIUM 40 MG PO TBEC
40.0000 mg | DELAYED_RELEASE_TABLET | Freq: Every day | ORAL | Status: DC
  Administered 2022-06-14 – 2022-06-15 (×2): 40 mg via ORAL
  Filled 2022-06-14 (×2): qty 1

## 2022-06-14 MED ORDER — ATENOLOL 25 MG PO TABS
100.0000 mg | ORAL_TABLET | Freq: Every day | ORAL | Status: DC
  Administered 2022-06-14 – 2022-06-15 (×2): 100 mg via ORAL
  Filled 2022-06-14 (×2): qty 4

## 2022-06-14 MED ORDER — LORATADINE 10 MG PO TABS
10.0000 mg | ORAL_TABLET | Freq: Every day | ORAL | Status: DC
  Administered 2022-06-14 – 2022-06-15 (×2): 10 mg via ORAL
  Filled 2022-06-14 (×2): qty 1

## 2022-06-14 NOTE — TOC Initial Note (Addendum)
Transition of Care (TOC) - Initial/Assessment Note    Patient Details  Name: John [name]. MRN: [id number] Date of Birth: 1975-02-13  Transition of Care [facility]) CM/SW Contact:    Darolyn Rua, LCSW Phone Number: 06/14/2022, 9:05 AM  Clinical Narrative:                  CSW spoke with Peyton Najjar with group home [facility]  He reports that they are requesting patient be sent to short term rehab to get him stronger to return.   Reports they do not have staffing to facilitate his bathroom and daily hygiene routine is our recommendation.   CSW informed him currently recommendations are for home health services, he reports they would not be able to accept patient back until he went ot short term rehab first.   CSW has reached out to PT to re assess regarding recommendations.   Patient's mom updated on above, she is also advocated for STR as patient's group home staff is unable to care for him as he needs to become stronger. They are unable to lift him and provide the additional care needed. Mom reports patient was at [facility] in the past and would be agreeable to referrals to be sent out to SNF if recommendations change by PT.   Group home owner [facility] at [phone] called this CSW and reports above, they are encouraging snf for patient as they are a group home and dont have the staff assistance needed for his level of care. Patient has been with them 6-7 years but he reports if [facility] recommendation stands, they would not be able to accept patient back. That they will accept him back after he goes to STR.   Expected Discharge Plan: Skilled Nursing Facility     Patient Goals and CMS Choice   CMS Medicare.gov Compare Post Acute Care list provided to:: Patient Choice offered to / list presented to : Patient      Expected Discharge Plan and Services       Living arrangements for the past 2 months: Group Home                                      Prior Living  Arrangements/Services Living arrangements for the past 2 months: Group Home                     Activities of Daily Living      Permission Sought/Granted                  Emotional Assessment              Admission diagnosis:  pain in both feet ems Patient Active Problem List   Diagnosis Date Noted   Wrist injury, right, initial encounter 01/09/2022   Acute deep vein thrombosis (DVT) of left upper extremity ([name]) 06/06/2021   SIRS of non-infectious origin w acute organ dysfunction ([name]) 06/06/2021   SIRS (systemic inflammatory response syndrome) ([name]) 06/06/2021   Gout    Hyperlipidemia    Community acquired pneumonia 08/29/2020   Bronchitis 08/23/2020   Idiopathic sleep related nonobstructive alveolar hypoventilation 08/23/2020   Sore throat 08/23/2020   OSA treated with BiPAP 08/08/2020   Obesity (BMI 30-39.9) 08/08/2020   OSA on CPAP 05/23/2020   CPAP use counseling 05/23/2020   BMI 36.0-36.9,adult 05/23/2020   Bilateral leg weakness 08/17/2019  Knee arthropathy 08/10/2019   Acute gout due to renal impairment involving right foot 07/08/2019   Encephalopathy acute    Somnolence 11/01/2016   AKI (acute kidney injury) ([name]) 11/01/2016   Sepsis, possible([name]) 04/27/2016   Tobacco abuse 11/28/2015   Sleep apnea    Essential hypertension 11/25/2015   Hypothyroidism 11/25/2015   Paranoid schizophrenia, chronic condition ([name]) 11/25/2015   Diabetes mellitus ([name])    Diabetes mellitus due to underlying condition without complication, without long-term current use of insulin ([name])    PCP:  Corky Downs, MD Pharmacy:   [facility] - [place], [place] - [address] [place] [address] Phone: [phone] Fax: [phone]  [facility] - [place], [place] - [address] [place] [address] Phone: [phone] Fax: [phone]  [facility] [address] [place] - [address] [place] [address] Phone: [phone] Fax: [phone]  [id number] [place], [place] - [address] AT [facility] [address] [place] [address] Phone: [phone] Fax: [phone]     Social Determinants of Health (SDOH) Social History: SDOH Screenings   Food Insecurity: No Food Insecurity (10/27/2020)  Housing: Low Risk  (10/27/2020)  Transportation Needs: No Transportation Needs (10/27/2020)  Alcohol Screen: Low Risk  (10/27/2020)  Depression (PHQ2-9): Low Risk  (10/27/2020)  Financial Resource Strain: Low Risk  (10/27/2020)  Physical Activity: Sufficiently Active (10/27/2020)  Social Connections: Socially Isolated (10/27/2020)  Stress: No Stress Concern Present (10/27/2020)  Tobacco Use: Medium Risk (06/12/2022)   SDOH Interventions:     Readmission Risk Interventions     No data to display

## 2022-06-14 NOTE — ED Provider Notes (Signed)
    06/13/2022    5:57 PM 06/12/2022    4:48 PM 06/12/2022   12:14 PM  Vitals with BMI  Systolic 130 126 161  Diastolic 74 65 88  Pulse 88 94 101    There have been no acute events during my shift or updates.  Patient is pending social work disposition.  He has been resting comfortably in hallway stretcher for most of my shift.   Georga Hacking, MD 06/14/22 [phone]

## 2022-06-14 NOTE — Progress Notes (Signed)
Physical Therapy Treatment Patient Details Name: John [name]. MRN: [id number] DOB: 29-Jul-1975 Today's Date: 06/14/2022   History of Present Illness John [name]. is a 47 y.o. male with a past medical history of DVT, hypertension, gout, diabetes, schizophrenia, obesity who presents today for evaluation of bilateral leg swelling though right greater than left.  Patient reports that this has been ongoing for approximately 1 week.    PT Comments    Patient progressing towards physical therapy goals. Pain improved from previous session and able to ambulate 61' with RW and supervision for safety but able to complete without hands on assistance. Patient reports feeling close to baseline for mobility and uses RW typically at group home. Encouraged patient to continue mobilizing with nursing staff while in ED. Discharge plan remains appropriate as patient is not far from baseline.     Recommendations for follow up therapy are one component of a multi-disciplinary discharge planning process, led by the attending physician.  Recommendations may be updated based on patient status, additional functional criteria and insurance authorization.  Follow Up Recommendations       Assistance Recommended at Discharge PRN  Patient can return home with the following Two people to help with bathing/dressing/bathroom;Assist for transportation;Help with stairs or ramp for entrance   Equipment Recommendations  Rolling John [name] (2 wheels)    Recommendations for Other Services       Precautions / Restrictions Precautions Precautions: Fall Restrictions Weight Bearing Restrictions: No     Mobility  Bed Mobility Overal bed mobility: Modified Independent                  Transfers Overall transfer level: Modified independent Equipment used: Rolling John [name] (2 wheels)                    Ambulation/Gait Ambulation/Gait assistance: Supervision Gait Distance (Feet): 80 Feet Assistive  device: Rolling John [name] (2 wheels) Gait Pattern/deviations: Step-to pattern Gait velocity: decreased     General Gait Details: slow step to gait pattern with no LOB noted. Flexed trunk throughout   Stairs             Wheelchair Mobility    Modified Rankin (Stroke Patients Only)       Balance Overall balance assessment: Needs assistance Sitting-balance support: Feet supported Sitting balance-Leahy Scale: Good     Standing balance support: Bilateral upper extremity supported, During functional activity, Reliant on assistive device for balance Standing balance-Leahy Scale: Fair                              Cognition Arousal/Alertness: Awake/alert Behavior During Therapy: WFL for tasks assessed/performed Overall Cognitive Status: Within Functional Limits for tasks assessed                                          Exercises      General Comments        Pertinent Vitals/Pain Pain Assessment Pain Assessment: Faces Faces Pain Scale: Hurts a little bit Pain Location: B feet, R more than L Pain Descriptors / Indicators: Aching Pain Intervention(s): Monitored during session    Home Living                          Prior Function  PT Goals (current goals can now be found in the care plan section) Acute Rehab PT Goals Patient Stated Goal: to decrease pain in feet PT Goal Formulation: With patient Time For Goal Achievement: 06/27/22 Potential to Achieve Goals: Good Progress towards PT goals: Progressing toward goals    Frequency    Min 2X/week      PT Plan Current plan remains appropriate    Co-evaluation              AM-PAC PT "6 Clicks" Mobility   Outcome Measure  Help needed turning from your back to your side while in a flat bed without using bedrails?: None Help needed moving from lying on your back to sitting on the side of a flat bed without using bedrails?: None Help needed moving to  and from a bed to a chair (including a wheelchair)?: None Help needed standing up from a chair using your arms (e.g., wheelchair or bedside chair)?: None Help needed to walk in hospital room?: A Little Help needed climbing 3-5 steps with a railing? : A Little 6 Click Score: 22    End of Session Equipment Utilized During Treatment: Gait belt Activity Tolerance: Patient tolerated treatment well Patient left: in bed (on stretcher in ED) Nurse Communication: Mobility status PT Visit Diagnosis: Other abnormalities of gait and mobility (R26.89);Pain;Difficulty in walking, not elsewhere classified (R26.2) Pain - part of body: Ankle and joints of foot     Time: 1100-1116 PT Time Calculation (min) (ACUTE ONLY): 16 min  Charges:  $Gait Training: 8-22 mins                     Maylon Peppers, PT, DPT Physical Therapist - [facility]    John [name] 06/14/2022, 12:06 PM

## 2022-06-15 DIAGNOSIS — M7121 Synovial cyst of popliteal space [Baker], right knee: Secondary | ICD-10-CM | POA: Diagnosis not present

## 2022-06-15 MED ORDER — TRAMADOL HCL 50 MG PO TABS
50.0000 mg | ORAL_TABLET | Freq: Four times a day (QID) | ORAL | Status: DC | PRN
  Administered 2022-06-15: 50 mg via ORAL
  Filled 2022-06-15: qty 1

## 2022-06-15 MED ORDER — ASENAPINE MALEATE 5 MG SL SUBL
10.0000 mg | SUBLINGUAL_TABLET | Freq: Every day | SUBLINGUAL | Status: DC
  Administered 2022-06-15: 10 mg via SUBLINGUAL
  Filled 2022-06-15: qty 2

## 2022-06-15 NOTE — ED Provider Notes (Signed)
Emergency Medicine Observation Re-evaluation Note  John [name]. is a 47 y.o. male, seen on rounds today.  Pt initially presented to the ED for complaints of Weakness Currently, the patient is resting on stretcher with no complaints, he reports he slept well last night  Physical Exam  BP 131/88 (BP Location: Left Arm)   Pulse 87   Temp 98.6 F (37 C) (Oral)   Resp 18   Ht 1.88 m (6\' 2" )   Wt 134 kg   SpO2 97%   BMI 37.93 kg/m  Physical Exam General: No complaints or concerns at this time  Lungs: Normal respiratory rate Psych: Calm and cooperative    Plan  Current plan is for TOC to explore possibility of short-term rehab.    Jene Every, MD 06/15/22 [phone]

## 2022-06-15 NOTE — ED Notes (Addendum)
This RN to bedside, woke pt. Briefly, asked if she could give him his meds and sit him up to eat breakfast. Pt. Responded "no thank you", closed his eyes and went back to sleep. Will re attempt meds when pt. Wakes.

## 2022-06-15 NOTE — Progress Notes (Signed)
PT Cancellation Note  Patient Details Name: John [name]. MRN: [id number] DOB: 1975/07/10   Cancelled Treatment:    Reason Eval/Treat Not Completed:  (Treatment session attempted. Patient currently in bathroom and unavailable.  Will re-attempt at later time this date.)  Dessie Delcarlo H. Manson Passey, PT, DPT, NCS 06/15/22, 10:51 AM [phone]

## 2022-06-15 NOTE — Progress Notes (Signed)
Physical Therapy Treatment Patient Details Name: John [name]. MRN: [id number] DOB: 30-Jan-1975 Today's Date: 06/15/2022   History of Present Illness John [name]. is a 47 y.o. male with a past medical history of DVT, hypertension, gout, diabetes, schizophrenia, obesity who presents today for evaluation of bilateral leg swelling though right greater than left.  Patient reports that this has been ongoing for approximately 1 week.    PT Comments    Patient continues to endorse edema and generalized pain to bilat feet (FACES 4/10); attempted to coordinate pre-medication, but care team preferring to avoid narcotics (currently ordered meds).  Did discuss non-narcotic alternatives to benefit patient; per RN, none currently available.  Similar to yesterday's session, patient able to complete transfers from various surfaces (stretcher, recliner seating surface) and gait (50') with RW, cga/close sup.  Demonstrates forward flexed posture; very slow, partially-reciprocal stepping pattern. Mildly antaltic pattern (due to pain?), but no overt buckling or LOB. Mild improvement in cadence/gait speed with cuing from therapist, but does not sustain without direct cuing   Per patient, group home facility is single-level with no steps required for entry/exit; baseline mobility status is ambulatory with RW within group home environment.  Appears to be near his functional baseline, and consistent between sessions (yesterday and today).  Would benefit optimally from return to normal, familiar environment and routines with post-acute PT follow up as established by interdisciplinary care team.   Recommendations for follow up therapy are one component of a multi-disciplinary discharge planning process, led by the attending physician.  Recommendations may be updated based on patient status, additional functional criteria and insurance authorization.  Follow Up Recommendations       Assistance Recommended at  Discharge    Patient can return home with the following A little help with walking and/or transfers;A little help with bathing/dressing/bathroom   Equipment Recommendations  Rolling walker (2 wheels)    Recommendations for Other Services       Precautions / Restrictions Precautions Precautions: Fall Restrictions Weight Bearing Restrictions: No     Mobility  Bed Mobility Overal bed mobility: Modified Independent                  Transfers Overall transfer level: Needs assistance Equipment used: Rolling walker (2 wheels) Transfers: Sit to/from Stand Sit to Stand: Min guard, Supervision           General transfer comment: optimal performance with pushing from bed with bilat UEs    Ambulation/Gait Ambulation/Gait assistance: Min guard, Supervision Gait Distance (Feet): 50 Feet Assistive device: Rolling walker (2 wheels)         General Gait Details: forward flexed posture; very slow, partially-reciprocal stepping pattern.  Mildly antaltic pattern (due to pain?), but no overt buckling or LOB.  Mild improvement in cadence/gait speed with cuing from therapist, but does not sustain without direct cuing   Stairs             Wheelchair Mobility    Modified Rankin (Stroke Patients Only)       Balance Overall balance assessment: Needs assistance Sitting-balance support: No upper extremity supported, Feet supported Sitting balance-Leahy Scale: Good     Standing balance support: Bilateral upper extremity supported Standing balance-Leahy Scale: Fair                              Cognition Arousal/Alertness: Awake/alert Behavior During Therapy: WFL for tasks assessed/performed Overall Cognitive Status: Within Functional  Limits for tasks assessed                                          Exercises Other Exercises Other Exercises: Sit/stand from stretcher x5, cga/close sup-emphasis on bilat UE/LE placement with movement  transition.  Fair tolerance for bilat knee/ankle movement and WBing Other Exercises: Sit/stand from recliner (lower seat surface) with RW, cga/close sup.  Remains dependant on UE support to complete, but able to do so with very minimal/no assist from therapist.    General Comments        Pertinent Vitals/Pain Pain Assessment Pain Assessment: Faces Faces Pain Scale: Hurts little more Pain Location: bilat feet Pain Descriptors / Indicators: Aching Pain Intervention(s): Limited activity within patient's tolerance, Monitored during session, Repositioned (requested per RN; advised that team was trying to avoid narcotics.  Questioned non-narcotic alternatives; none available in current state per RN)    Home Living                          Prior Function            PT Goals (current goals can now be found in the care plan section) Acute Rehab PT Goals Patient Stated Goal: to decrease pain in feet PT Goal Formulation: With patient Time For Goal Achievement: 06/27/22 Potential to Achieve Goals: Good    Frequency    Min 3X/week      PT Plan Current plan remains appropriate    Co-evaluation              AM-PAC PT "6 Clicks" Mobility   Outcome Measure  Help needed turning from your back to your side while in a flat bed without using bedrails?: None Help needed moving from lying on your back to sitting on the side of a flat bed without using bedrails?: None Help needed moving to and from a bed to a chair (including a wheelchair)?: A Little Help needed standing up from a chair using your arms (e.g., wheelchair or bedside chair)?: A Little Help needed to walk in hospital room?: A Little Help needed climbing 3-5 steps with a railing? : A Little 6 Click Score: 20    End of Session Equipment Utilized During Treatment: Gait belt Activity Tolerance: Patient tolerated treatment well Patient left:  (in recliner in ED hallway; RN informed/aware of position and  performance during session) Nurse Communication: Mobility status PT Visit Diagnosis: Other abnormalities of gait and mobility (R26.89);Pain;Difficulty in walking, not elsewhere classified (R26.2) Pain - part of body: Ankle and joints of foot     Time: 1250-1311 PT Time Calculation (min) (ACUTE ONLY): 21 min  Charges:  $Gait Training: 8-22 mins                     Crissy Mccreadie H. Manson Passey, PT, DPT, NCS 06/15/22, 3:30 PM [phone]

## 2022-06-15 NOTE — ED Notes (Addendum)
PT working with pt at [facility]. Set up in recliner, assisted with lunch.

## 2022-06-15 NOTE — ED Provider Notes (Signed)
-----------------------------------------   4:45 PM on 06/15/2022 -----------------------------------------  Group home is ready to pick up the patient.  He is stable for discharge at this time.   Dionne Bucy, MD 06/15/22 1645

## 2022-06-15 NOTE — ED Notes (Signed)
PT at bedside.

## 2022-06-15 NOTE — ED Notes (Signed)
Emptied pt's urinal multiple times over the course of the shift, >1000 ml's of urine output. Pt is resting comfortably at this time, NAD, no needs stated.

## 2022-06-15 NOTE — Progress Notes (Signed)
Mobility Specialist - Progress Note    06/15/22 1444  Mobility  Activity Ambulated with assistance in room  Level of Assistance Contact guard assist, steadying assist  Assistive Device Front wheel walker  Distance Ambulated (ft) 5 ft  Range of Motion/Exercises Active  Activity Response Tolerated well  Mobility Referral Yes  $Mobility charge 1 Mobility  Mobility Specialist Start Time (ACUTE ONLY) 1428  Mobility Specialist Stop Time (ACUTE ONLY) 1442  Mobility Specialist Time Calculation (min) (ACUTE ONLY) 14 min   Pt resting in recliner on RA upon entry. Pt STS Min/ModA and ambulates in room with RW CGA. Pt endorses 6/10 foot pain (chronic). Pt takes a seated rest break after 5-6 ft due to feet pain. Pt returned to bed and left with needs in reach.   Johnathan Hausen Mobility Specialist 06/15/22, 2:51 PM

## 2022-06-15 NOTE — TOC Progression Note (Addendum)
Transition of Care (TOC) - Progression Note    Patient Details  Name: John [name]. MRN: [id number] Date of Birth: 07-22-1975  Transition of [facility]) CM/SW Contact  Darolyn Rua, [place] Phone Number: 06/15/2022, 1:28 PM  Clinical Narrative:     CSW lvm for Group home owner [facility] at [phone]  and lvm with Cindie Crumbly [phone] to attempt to update group home on patient's progress with physical therapy as patient is back to baseline. Pending call back from facility at this time.   Patient's mother updated on above.     Expected Discharge Plan: Skilled Nursing Facility    Expected Discharge Plan and Services       Living arrangements for the past 2 months: Group Home                                       Social Determinants of Health (SDOH) Interventions SDOH Screenings   Food Insecurity: No Food Insecurity (10/27/2020)  Housing: Low Risk  (10/27/2020)  Transportation Needs: No Transportation Needs (10/27/2020)  Alcohol Screen: Low Risk  (10/27/2020)  Depression (PHQ2-9): Low Risk  (10/27/2020)  Financial Resource Strain: Low Risk  (10/27/2020)  Physical Activity: Sufficiently Active (10/27/2020)  Social Connections: Socially Isolated (10/27/2020)  Stress: No Stress Concern Present (10/27/2020)  Tobacco Use: Medium Risk (06/12/2022)    Readmission Risk Interventions     No data to display

## 2022-06-15 NOTE — TOC Transition Note (Signed)
Transition of Care [facility]) - CM/SW Discharge Note   Patient Details  Name: John [name]. MRN: [id number] Date of Birth: [birth year]  [facility]) CM/SW Contact:  Darolyn Rua, LCSW Phone Number: 06/15/2022, 4:35 PM   Clinical Narrative:     CSW received call from [facility] group home owner at [phone] he inquired as to patient's progress with PT. CSW informed him patient has excelled today and is ready to return, patient is back at baseline using RW as needed and was able to complete sit/stand from bed and chair. [facility] reports they will be picking patient up in 30 minutes, CSW has informed RN.    Final next level of care: Group Home Barriers to Discharge: No Barriers Identified   Patient Goals and CMS Choice CMS Medicare.gov Compare Post Acute Care list provided to:: Patient Choice offered to / list presented to : Patient  Discharge Placement                      Patient and family notified of of transfer: 06/15/22  Discharge Plan and Services Additional resources added to the After Visit Summary for                                       Social Determinants of Health (SDOH) Interventions SDOH Screenings   Food Insecurity: No Food Insecurity (10/27/2020)  Housing: Low Risk  (10/27/2020)  Transportation Needs: No Transportation Needs (10/27/2020)  Alcohol Screen: Low Risk  (10/27/2020)  Depression (PHQ2-9): Low Risk  (10/27/2020)  Financial Resource Strain: Low Risk  (10/27/2020)  Physical Activity: Sufficiently Active (10/27/2020)  Social Connections: Socially Isolated (10/27/2020)  Stress: No Stress Concern Present (10/27/2020)  Tobacco Use: Medium Risk (06/12/2022)     Readmission Risk Interventions     No data to display

## 2022-06-15 NOTE — ED Notes (Signed)
This RN to bedside for pt. Request to be unhooked to get to the bathroom. Pt. Requests wheelchair, this RN encourages pt. To walk for exercise and to maintain mobility. Pt. States his feet/legs hurt too much and he does not want to walk. This RN and Vet, RN max assist pt. To standing position with walker, pt. Is shaky on his feet, and pivoting is difficult. Pt. Was able to pivot to wheelchair and transported to bathroom, again with max assistance, pivots to toilet. Pt. Urinates only, states did not need to have BM. With Max x2 staff, this RN and Marisue Ivan, RN, pt. assisted back to wheelchair and back to bed. This RN encouraged pt. To sit up in recliner, pt. Refuses, states he will remain in bed. Marisue Ivan, RN requested Dr. Cyril Loosen to reassess pt's feet. Dr. Cyril Loosen appreciates no acute changes, states changes/skin appear chronic.

## 2022-06-15 NOTE — ED Notes (Signed)
Pt. Resting comfortably in stretcher, eyes closed, chest rise and fall. NAD.

## 2022-06-15 NOTE — Consult Note (Signed)
Pharmacy Consult - Clozapine     47 yo male ordered clozapine 150 mg PO HS  This patient's order has been reviewed for prescribing contraindications.    Clozapine REMS enrollment Verified: yes, 06/15/22 REMS patient ID: [id number] Current Outpatient Monitoring: Monthly    Home Regimen:  150 mg PO HS Last dose: 06/09/22 PTA   Dose Adjustments This Admission: N/A   Labs: Date    ANC    Submitted? 6/11 6100 yes     Plan: Continue clozapine 150 mg po HS Monitor ANC at least weekly while inpatient, next due 06/19/22    Norma Fredrickson, PharmD

## 2022-07-02 ENCOUNTER — Ambulatory Visit: Payer: 59 | Admitting: Internal Medicine

## 2022-07-02 NOTE — Progress Notes (Signed)
Pt did not show for scheduled appointment.  

## 2023-02-15 ENCOUNTER — Other Ambulatory Visit
Admission: RE | Admit: 2023-02-15 | Discharge: 2023-02-15 | Disposition: A | Discharge status: Home / Self Care | Payer: 59 | Attending: Nurse Practitioner | Admitting: Nurse Practitioner

## 2023-02-15 DIAGNOSIS — F209 Schizophrenia, unspecified: Secondary | ICD-10-CM | POA: Diagnosis present

## 2023-02-15 DIAGNOSIS — Z79899 Other long term (current) drug therapy: Secondary | ICD-10-CM | POA: Diagnosis not present

## 2023-02-15 LAB — CBC WITH DIFFERENTIAL/PLATELET
Abs Immature Granulocytes: 0.02 10*3/uL (ref 0.00–0.07)
Basophils Absolute: 0 10*3/uL (ref 0.0–0.1)
Basophils Relative: 1 %
Eosinophils Absolute: 0.2 10*3/uL (ref 0.0–0.5)
Eosinophils Relative: 3 %
HCT: 39 % (ref 39.0–52.0)
Hemoglobin: 11.9 g/dL — ABNORMAL LOW (ref 13.0–17.0)
Immature Granulocytes: 0 %
Lymphocytes Relative: 37 %
Lymphs Abs: 2.3 10*3/uL (ref 0.7–4.0)
MCH: 26.4 pg (ref 26.0–34.0)
MCHC: 30.5 g/dL (ref 30.0–36.0)
MCV: 86.7 fL (ref 80.0–100.0)
Monocytes Absolute: 0.4 10*3/uL (ref 0.1–1.0)
Monocytes Relative: 6 %
Neutro Abs: 3.3 10*3/uL (ref 1.7–7.7)
Neutrophils Relative %: 53 %
Platelets: 183 10*3/uL (ref 150–400)
RBC: 4.5 MIL/uL (ref 4.22–5.81)
RDW: 14.3 % (ref 11.5–15.5)
WBC: 6.2 10*3/uL (ref 4.0–10.5)
nRBC: 0 % (ref 0.0–0.2)

## 2023-04-09 ENCOUNTER — Other Ambulatory Visit
Admission: RE | Admit: 2023-04-09 | Discharge: 2023-04-09 | Disposition: A | Discharge status: Home / Self Care | Attending: Nurse Practitioner | Admitting: Nurse Practitioner

## 2023-04-09 DIAGNOSIS — Z79899 Other long term (current) drug therapy: Secondary | ICD-10-CM | POA: Insufficient documentation

## 2023-04-09 DIAGNOSIS — F209 Schizophrenia, unspecified: Secondary | ICD-10-CM | POA: Insufficient documentation

## 2023-04-09 LAB — CBC WITH DIFFERENTIAL/PLATELET
Abs Immature Granulocytes: 0.03 10*3/uL (ref 0.00–0.07)
Basophils Absolute: 0 10*3/uL (ref 0.0–0.1)
Basophils Relative: 1 %
Eosinophils Absolute: 0.1 10*3/uL (ref 0.0–0.5)
Eosinophils Relative: 2 %
HCT: 38.8 % — ABNORMAL LOW (ref 39.0–52.0)
Hemoglobin: 12 g/dL — ABNORMAL LOW (ref 13.0–17.0)
Immature Granulocytes: 1 %
Lymphocytes Relative: 35 %
Lymphs Abs: 2.1 10*3/uL (ref 0.7–4.0)
MCH: 26.7 pg (ref 26.0–34.0)
MCHC: 30.9 g/dL (ref 30.0–36.0)
MCV: 86.2 fL (ref 80.0–100.0)
Monocytes Absolute: 0.4 10*3/uL (ref 0.1–1.0)
Monocytes Relative: 7 %
Neutro Abs: 3.2 10*3/uL (ref 1.7–7.7)
Neutrophils Relative %: 54 %
Platelets: 190 10*3/uL (ref 150–400)
RBC: 4.5 MIL/uL (ref 4.22–5.81)
RDW: 14 % (ref 11.5–15.5)
WBC: 5.9 10*3/uL (ref 4.0–10.5)
nRBC: 0 % (ref 0.0–0.2)

## 2023-05-17 ENCOUNTER — Other Ambulatory Visit
Admission: RE | Admit: 2023-05-17 | Discharge: 2023-05-17 | Disposition: A | Discharge status: Home / Self Care | Source: Ambulatory Visit | Attending: Nurse Practitioner | Admitting: Nurse Practitioner

## 2023-05-17 DIAGNOSIS — Z79899 Other long term (current) drug therapy: Secondary | ICD-10-CM | POA: Diagnosis not present

## 2023-05-17 DIAGNOSIS — F209 Schizophrenia, unspecified: Secondary | ICD-10-CM | POA: Insufficient documentation

## 2023-05-17 LAB — COMPREHENSIVE METABOLIC PANEL WITH GFR
ALT: 19 U/L (ref 0–44)
AST: 17 U/L (ref 15–41)
Albumin: 3.6 g/dL (ref 3.5–5.0)
Alkaline Phosphatase: 44 U/L (ref 38–126)
Anion gap: 9 (ref 5–15)
BUN: 11 mg/dL (ref 6–20)
CO2: 28 mmol/L (ref 22–32)
Calcium: 8.9 mg/dL (ref 8.9–10.3)
Chloride: 101 mmol/L (ref 98–111)
Creatinine, Ser: 0.93 mg/dL (ref 0.61–1.24)
GFR, Estimated: >60 mL/min (ref >60)
Glucose, Bld: 128 mg/dL — ABNORMAL HIGH (ref 70–99)
Potassium: 4.4 mmol/L (ref 3.5–5.1)
Sodium: 138 mmol/L (ref 135–145)
Total Bilirubin: 0.5 mg/dL (ref 0.0–1.2)
Total Protein: 8.5 g/dL — ABNORMAL HIGH (ref 6.5–8.1)

## 2023-05-17 LAB — VALPROIC ACID LEVEL: Valproic Acid Lvl: 94 ug/mL (ref 50–100)

## 2023-08-05 ENCOUNTER — Other Ambulatory Visit
Admission: RE | Admit: 2023-08-05 | Discharge: 2023-08-05 | Disposition: A | Discharge status: Home / Self Care | Source: Ambulatory Visit | Attending: Nurse Practitioner | Admitting: Nurse Practitioner

## 2023-08-05 DIAGNOSIS — Z79899 Other long term (current) drug therapy: Secondary | ICD-10-CM | POA: Diagnosis not present

## 2023-08-05 DIAGNOSIS — F209 Schizophrenia, unspecified: Secondary | ICD-10-CM | POA: Insufficient documentation

## 2023-08-05 LAB — COMPREHENSIVE METABOLIC PANEL WITH GFR
ALT: 21 U/L (ref 0–44)
AST: 21 U/L (ref 15–41)
Albumin: 3.2 g/dL — ABNORMAL LOW (ref 3.5–5.0)
Alkaline Phosphatase: 44 U/L (ref 38–126)
Anion gap: 10 (ref 5–15)
BUN: 8 mg/dL (ref 6–20)
CO2: 28 mmol/L (ref 22–32)
Calcium: 8.7 mg/dL — ABNORMAL LOW (ref 8.9–10.3)
Chloride: 98 mmol/L (ref 98–111)
Creatinine, Ser: 0.86 mg/dL (ref 0.61–1.24)
GFR, Estimated: >60 mL/min (ref >60)
Glucose, Bld: 180 mg/dL — ABNORMAL HIGH (ref 70–99)
Potassium: 4.2 mmol/L (ref 3.5–5.1)
Sodium: 136 mmol/L (ref 135–145)
Total Bilirubin: 0.4 mg/dL (ref 0.0–1.2)
Total Protein: 7.4 g/dL (ref 6.5–8.1)

## 2023-08-05 LAB — VALPROIC ACID LEVEL: Valproic Acid Lvl: 74 ug/mL (ref 50–100)

## 2023-08-30 ENCOUNTER — Other Ambulatory Visit
Admission: RE | Admit: 2023-08-30 | Discharge: 2023-08-30 | Disposition: A | Discharge status: Home / Self Care | Source: Ambulatory Visit | Attending: Nurse Practitioner | Admitting: Nurse Practitioner

## 2023-08-30 DIAGNOSIS — Z79899 Other long term (current) drug therapy: Secondary | ICD-10-CM | POA: Insufficient documentation

## 2023-08-30 DIAGNOSIS — F209 Schizophrenia, unspecified: Secondary | ICD-10-CM | POA: Diagnosis present

## 2023-08-30 LAB — CBC WITH DIFFERENTIAL/PLATELET
Abs Immature Granulocytes: 0.03 K/uL (ref 0.00–0.07)
Basophils Absolute: 0 K/uL (ref 0.0–0.1)
Basophils Relative: 1 %
Eosinophils Absolute: 0.1 K/uL (ref 0.0–0.5)
Eosinophils Relative: 2 %
HCT: 35.7 % — ABNORMAL LOW (ref 39.0–52.0)
Hemoglobin: 11.2 g/dL — ABNORMAL LOW (ref 13.0–17.0)
Immature Granulocytes: 1 %
Lymphocytes Relative: 39 %
Lymphs Abs: 2.1 K/uL (ref 0.7–4.0)
MCH: 26.5 pg (ref 26.0–34.0)
MCHC: 31.4 g/dL (ref 30.0–36.0)
MCV: 84.6 fL (ref 80.0–100.0)
Monocytes Absolute: 0.5 K/uL (ref 0.1–1.0)
Monocytes Relative: 9 %
Neutro Abs: 2.7 K/uL (ref 1.7–7.7)
Neutrophils Relative %: 48 %
Platelets: 200 K/uL (ref 150–400)
RBC: 4.22 MIL/uL (ref 4.22–5.81)
RDW: 13.8 % (ref 11.5–15.5)
WBC: 5.4 K/uL (ref 4.0–10.5)
nRBC: 0 % (ref 0.0–0.2)

## 2023-08-30 LAB — VALPROIC ACID LEVEL: Valproic Acid Lvl: 66 ug/mL (ref 50–100)

## 2023-10-03 ENCOUNTER — Other Ambulatory Visit
Admission: RE | Admit: 2023-10-03 | Discharge: 2023-10-03 | Disposition: A | Discharge status: Home / Self Care | Source: Ambulatory Visit | Attending: Nurse Practitioner | Admitting: Nurse Practitioner

## 2023-10-03 DIAGNOSIS — Z79899 Other long term (current) drug therapy: Secondary | ICD-10-CM | POA: Insufficient documentation

## 2023-10-03 DIAGNOSIS — F209 Schizophrenia, unspecified: Secondary | ICD-10-CM | POA: Diagnosis present

## 2023-10-03 LAB — VALPROIC ACID LEVEL: Valproic Acid Lvl: 65 ug/mL (ref 50–100)

## 2023-10-03 LAB — COMPREHENSIVE METABOLIC PANEL WITH GFR
ALT: 41 U/L (ref 0–44)
AST: 33 U/L (ref 15–41)
Albumin: 3.6 g/dL (ref 3.5–5.0)
Alkaline Phosphatase: 54 U/L (ref 38–126)
Anion gap: 11 (ref 5–15)
BUN: 7 mg/dL (ref 6–20)
CO2: 27 mmol/L (ref 22–32)
Calcium: 8.9 mg/dL (ref 8.9–10.3)
Chloride: 98 mmol/L (ref 98–111)
Creatinine, Ser: 0.87 mg/dL (ref 0.61–1.24)
GFR, Estimated: >60 mL/min (ref >60)
Glucose, Bld: 172 mg/dL — ABNORMAL HIGH (ref 70–99)
Potassium: 3.9 mmol/L (ref 3.5–5.1)
Sodium: 136 mmol/L (ref 135–145)
Total Bilirubin: 0.3 mg/dL (ref 0.0–1.2)
Total Protein: 8.5 g/dL — ABNORMAL HIGH (ref 6.5–8.1)

## 2023-10-08 NOTE — Progress Notes (Signed)
 John [name].                                          MRN: [id number]   10/08/2023   The [facility]BCI Quality Team Specialist reviewed this patient medical record for the purposes of chart review for care gap closure. The following were reviewed: chart review for care gap closure-colorectal cancer screening.    VBCI Quality Team

## 2023-10-27 ENCOUNTER — Emergency Department
Admission: EM | Admit: 2023-10-27 | Discharge: 2023-10-27 | Disposition: A | Discharge status: Home / Self Care | Attending: Emergency Medicine | Admitting: Emergency Medicine

## 2023-10-27 ENCOUNTER — Emergency Department

## 2023-10-27 DIAGNOSIS — E119 Type 2 diabetes mellitus without complications: Secondary | ICD-10-CM | POA: Insufficient documentation

## 2023-10-27 DIAGNOSIS — L03012 Cellulitis of left finger: Secondary | ICD-10-CM | POA: Insufficient documentation

## 2023-10-27 DIAGNOSIS — J45909 Unspecified asthma, uncomplicated: Secondary | ICD-10-CM | POA: Insufficient documentation

## 2023-10-27 DIAGNOSIS — M7989 Other specified soft tissue disorders: Secondary | ICD-10-CM | POA: Diagnosis present

## 2023-10-27 LAB — CBC WITH DIFFERENTIAL/PLATELET
Abs Immature Granulocytes: 0.04 K/uL (ref 0.00–0.07)
Basophils Absolute: 0 K/uL (ref 0.0–0.1)
Basophils Relative: 1 %
Eosinophils Absolute: 0.1 K/uL (ref 0.0–0.5)
Eosinophils Relative: 1 %
HCT: 39 % (ref 39.0–52.0)
Hemoglobin: 12.1 g/dL — ABNORMAL LOW (ref 13.0–17.0)
Immature Granulocytes: 1 %
Lymphocytes Relative: 34 %
Lymphs Abs: 2 K/uL (ref 0.7–4.0)
MCH: 26 pg (ref 26.0–34.0)
MCHC: 31 g/dL (ref 30.0–36.0)
MCV: 83.7 fL (ref 80.0–100.0)
Monocytes Absolute: 0.5 K/uL (ref 0.1–1.0)
Monocytes Relative: 9 %
Neutro Abs: 3.3 K/uL (ref 1.7–7.7)
Neutrophils Relative %: 54 %
Platelets: 218 K/uL (ref 150–400)
RBC: 4.66 MIL/uL (ref 4.22–5.81)
RDW: 14 % (ref 11.5–15.5)
WBC: 6 K/uL (ref 4.0–10.5)
nRBC: 0 % (ref 0.0–0.2)

## 2023-10-27 LAB — BASIC METABOLIC PANEL WITH GFR
Anion gap: 9 (ref 5–15)
BUN: 10 mg/dL (ref 6–20)
CO2: 26 mmol/L (ref 22–32)
Calcium: 8.6 mg/dL — ABNORMAL LOW (ref 8.9–10.3)
Chloride: 101 mmol/L (ref 98–111)
Creatinine, Ser: 0.61 mg/dL (ref 0.61–1.24)
GFR, Estimated: >60 mL/min (ref >60)
Glucose, Bld: 110 mg/dL — ABNORMAL HIGH (ref 70–99)
Potassium: 4 mmol/L (ref 3.5–5.1)
Sodium: 136 mmol/L (ref 135–145)

## 2023-10-27 MED ORDER — DOXYCYCLINE HYCLATE 100 MG PO TABS: 100.0000 mg | ORAL_TABLET | Freq: Two times a day (BID) | ORAL | 0 refills | Status: AC

## 2023-10-27 MED ORDER — SODIUM CHLORIDE 0.9 % IV SOLN
1.0000 g | Freq: Once | INTRAVENOUS | Status: AC
  Administered 2023-10-27: 1 g via INTRAVENOUS
  Filled 2023-10-27: qty 10

## 2023-10-27 NOTE — ED Notes (Signed)
 Spoke with [name] from group home to provide transport. [name]'s ETA to ED is 2000.

## 2023-10-27 NOTE — Discharge Instructions (Addendum)
 Patient has an infection in his hand.  He should have his antibiotic filled tomorrow.  Take the medication as prescribed.  If worsening symptoms he should return the emergency department for reevaluation.  Otherwise he can follow-up with his regular doctor.

## 2023-10-27 NOTE — ED Provider Notes (Signed)
 [facility] Provider Note    Event Date/Time   First MD Initiated Contact with Patient 10/27/23 1817     (approximate)   History   Hand Pain   HPI  John [name]. is a 48 y.o. male history of thyroid  disease, diabetes, asthma, paranoid schizophrenia presents emergency department from home with left hand and finger swelling and pain.  Patient states he injured his hand on Friday while playing football.  Noticed that the ring finger was swollen and now the middle finger is also swollen and red.  Tender.  Denies fever or chills.  States is able to move his fingers like normal.      Physical Exam   Triage Vital Signs: ED Triage Vitals  Encounter Vitals Group     BP 10/27/23 1707 (!) 133/99     Girls Systolic BP Percentile --      Girls Diastolic BP Percentile --      Boys Systolic BP Percentile --      Boys Diastolic BP Percentile --      Pulse Rate 10/27/23 1707 (!) 105     Resp 10/27/23 1707 18     Temp 10/27/23 1707 97.8 F (36.6 C)     Temp Source 10/27/23 1707 Oral     SpO2 10/27/23 1707 95 %     Weight 10/27/23 1706 300 lb (136.1 kg)     Height 10/27/23 1706 6' (1.829 m)     Head Circumference --      Peak Flow --      Pain Score 10/27/23 1706 8     Pain Loc --      Pain Education --      Exclude from Growth Chart --     Most recent vital signs: Vitals:   10/27/23 1707 10/27/23 1828  BP: (!) 133/99   Pulse: (!) 105   Resp: 18   Temp: 97.8 F (36.6 C)   SpO2: 95% 96%     General: Awake, no distress.   CV:  Good peripheral perfusion.  Resp:  Normal effort.  Abd:  No distention.   Other:  Left hand with swelling noted to the middle finger with redness noted proximally, red streak noted going up the center of the hand, minimal swelling noted to the ring finger, full range of motion   ED Results / Procedures / Treatments   Labs (all labs ordered are listed, but only abnormal results are displayed) Labs Reviewed  BASIC  METABOLIC PANEL WITH GFR - Abnormal; Notable for the following components:      Result Value   Glucose, Bld 110 (*)    Calcium  8.6 (*)    All other components within normal limits  CBC WITH DIFFERENTIAL/PLATELET - Abnormal; Notable for the following components:   Hemoglobin 12.1 (*)    All other components within normal limits     EKG     RADIOLOGY X-ray left hand    PROCEDURES:   Procedures  Critical Care:  no Chief Complaint  Patient presents with   Hand Pain      MEDICATIONS ORDERED IN ED: Medications  cefTRIAXone  (ROCEPHIN ) 1 g in sodium chloride  0.9 % 100 mL IVPB (1 g Intravenous New Bag/Given 10/27/23 1831)     IMPRESSION / MDM / ASSESSMENT AND PLAN / ED COURSE  I reviewed the triage vital signs and the nursing notes.  Differential diagnosis includes, but is not limited to, fracture, contusion, cellulitis, felon, paronychia  Patient's presentation is most consistent with acute illness / injury with system symptoms.    Medications given: Rocephin  1 g IV  Patient seems to have an infection of the left middle finger, no fracture noted on x-ray, we will go ahead and give him Rocephin  1 g IV, will start him on oral medications.  He will have to return if worsening  X-ray of left hand independently reviewed interpreted by me as being negative for acute abnormality  Patient tolerated antibiotics well.  Will discharge to home.  Prescription sent to his pharmacy for antibiotics.  He can pick this up tomorrow.  If he is worsening he should return to emergency department for reevaluation.  He is in agreement treatment plan.  Discharged stable condition.      FINAL CLINICAL IMPRESSION(S) / ED DIAGNOSES   Final diagnoses:  Cellulitis of finger of left hand     Rx / DC Orders   ED Discharge Orders          Ordered    doxycycline  (VIBRA -TABS) 100 MG tablet  2 times daily        10/27/23 1913             Note:  This  document was prepared using Dragon voice recognition software and may include unintentional dictation errors.    Gasper Devere ORN, PA-C 10/27/23 1914    Willo Dunnings, MD 10/28/23 [phone]

## 2023-10-27 NOTE — ED Triage Notes (Signed)
 Pt presents to the ED via POV from home with left hand/finger swelling and pain. Pt reports having an injury while playing football on Friday.

## 2023-11-07 ENCOUNTER — Other Ambulatory Visit
Admission: RE | Admit: 2023-11-07 | Discharge: 2023-11-07 | Disposition: A | Discharge status: Home / Self Care | Source: Ambulatory Visit | Attending: Nurse Practitioner | Admitting: Nurse Practitioner

## 2023-11-07 DIAGNOSIS — R5383 Other fatigue: Secondary | ICD-10-CM | POA: Insufficient documentation

## 2023-11-07 DIAGNOSIS — F209 Schizophrenia, unspecified: Secondary | ICD-10-CM | POA: Diagnosis present

## 2023-11-07 DIAGNOSIS — F250 Schizoaffective disorder, bipolar type: Secondary | ICD-10-CM | POA: Diagnosis not present

## 2023-11-07 DIAGNOSIS — Z79899 Other long term (current) drug therapy: Secondary | ICD-10-CM | POA: Diagnosis present

## 2023-11-07 LAB — CBC WITH DIFFERENTIAL/PLATELET
Abs Immature Granulocytes: 0.03 K/uL (ref 0.00–0.07)
Basophils Absolute: 0 K/uL (ref 0.0–0.1)
Basophils Relative: 1 %
Eosinophils Absolute: 0.1 K/uL (ref 0.0–0.5)
Eosinophils Relative: 2 %
HCT: 37.3 % — ABNORMAL LOW (ref 39.0–52.0)
Hemoglobin: 11.4 g/dL — ABNORMAL LOW (ref 13.0–17.0)
Immature Granulocytes: 1 %
Lymphocytes Relative: 34 %
Lymphs Abs: 2.1 K/uL (ref 0.7–4.0)
MCH: 25.9 pg — ABNORMAL LOW (ref 26.0–34.0)
MCHC: 30.6 g/dL (ref 30.0–36.0)
MCV: 84.8 fL (ref 80.0–100.0)
Monocytes Absolute: 0.4 K/uL (ref 0.1–1.0)
Monocytes Relative: 7 %
Neutro Abs: 3.5 K/uL (ref 1.7–7.7)
Neutrophils Relative %: 55 %
Platelets: 196 K/uL (ref 150–400)
RBC: 4.4 MIL/uL (ref 4.22–5.81)
RDW: 14.4 % (ref 11.5–15.5)
WBC: 6.2 K/uL (ref 4.0–10.5)
nRBC: 0 % (ref 0.0–0.2)

## 2023-11-07 LAB — COMPREHENSIVE METABOLIC PANEL WITH GFR
ALT: 31 U/L (ref 0–44)
AST: 33 U/L (ref 15–41)
Albumin: 3.3 g/dL — ABNORMAL LOW (ref 3.5–5.0)
Alkaline Phosphatase: 49 U/L (ref 38–126)
Anion gap: 12 (ref 5–15)
BUN: 7 mg/dL (ref 6–20)
CO2: 27 mmol/L (ref 22–32)
Calcium: 8.4 mg/dL — ABNORMAL LOW (ref 8.9–10.3)
Chloride: 97 mmol/L — ABNORMAL LOW (ref 98–111)
Creatinine, Ser: 0.8 mg/dL (ref 0.61–1.24)
GFR, Estimated: >60 mL/min (ref >60)
Glucose, Bld: 145 mg/dL — ABNORMAL HIGH (ref 70–99)
Potassium: 3.7 mmol/L (ref 3.5–5.1)
Sodium: 136 mmol/L (ref 135–145)
Total Bilirubin: 0.6 mg/dL (ref 0.0–1.2)
Total Protein: 8.2 g/dL — ABNORMAL HIGH (ref 6.5–8.1)

## 2023-11-07 LAB — VALPROIC ACID LEVEL: Valproic Acid Lvl: 87 ug/mL (ref 50–100)

## 2023-11-26 NOTE — Progress Notes (Signed)
 John [name].                                          MRN: [id number]   11/26/2023   The VBCI Quality Team Specialist reviewed this patient medical record for the purposes of chart review for care gap closure. The following were reviewed: chart review for care gap closure-kidney health evaluation for diabetes:eGFR  and uACR.    VBCI Quality Team

## 2023-12-10 ENCOUNTER — Other Ambulatory Visit
Admission: RE | Admit: 2023-12-10 | Discharge: 2023-12-10 | Disposition: A | Source: Ambulatory Visit | Attending: Nurse Practitioner | Admitting: Nurse Practitioner

## 2023-12-10 LAB — CBC WITH DIFFERENTIAL/PLATELET
Abs Immature Granulocytes: 0.03 K/uL (ref 0.00–0.07)
Basophils Absolute: 0 K/uL (ref 0.0–0.1)
Basophils Relative: 0 %
Eosinophils Absolute: 0.1 K/uL (ref 0.0–0.5)
Eosinophils Relative: 2 %
HCT: 41.7 % (ref 39.0–52.0)
Hemoglobin: 12.6 g/dL — ABNORMAL LOW (ref 13.0–17.0)
Immature Granulocytes: 0 %
Lymphocytes Relative: 30 %
Lymphs Abs: 2.2 K/uL (ref 0.7–4.0)
MCH: 25.9 pg — ABNORMAL LOW (ref 26.0–34.0)
MCHC: 30.2 g/dL (ref 30.0–36.0)
MCV: 85.8 fL (ref 80.0–100.0)
Monocytes Absolute: 0.4 K/uL (ref 0.1–1.0)
Monocytes Relative: 6 %
Neutro Abs: 4.6 K/uL (ref 1.7–7.7)
Neutrophils Relative %: 62 %
Platelets: 233 K/uL (ref 150–400)
RBC: 4.86 MIL/uL (ref 4.22–5.81)
RDW: 14.4 % (ref 11.5–15.5)
WBC: 7.3 K/uL (ref 4.0–10.5)
nRBC: 0 % (ref 0.0–0.2)

## 2023-12-10 LAB — COMPREHENSIVE METABOLIC PANEL WITH GFR
ALT: 28 U/L (ref 0–44)
AST: 31 U/L (ref 15–41)
Albumin: 4 g/dL (ref 3.5–5.0)
Alkaline Phosphatase: 55 U/L (ref 38–126)
Anion gap: 10 (ref 5–15)
BUN: 11 mg/dL (ref 6–20)
CO2: 30 mmol/L (ref 22–32)
Calcium: 9.1 mg/dL (ref 8.9–10.3)
Chloride: 98 mmol/L (ref 98–111)
Creatinine, Ser: 0.77 mg/dL (ref 0.61–1.24)
GFR, Estimated: >60 mL/min (ref >60)
Glucose, Bld: 139 mg/dL — ABNORMAL HIGH (ref 70–99)
Potassium: 4.8 mmol/L (ref 3.5–5.1)
Sodium: 138 mmol/L (ref 135–145)
Total Bilirubin: 0.3 mg/dL (ref 0.0–1.2)
Total Protein: 8.2 g/dL — ABNORMAL HIGH (ref 6.5–8.1)

## 2023-12-10 LAB — TSH: TSH: 3.87 u[IU]/mL (ref 0.350–4.500)

## 2023-12-10 LAB — T4, FREE: Free T4: 1.04 ng/dL (ref 0.61–1.12)

## 2023-12-10 LAB — VALPROIC ACID LEVEL: Valproic Acid Lvl: 78 ug/mL (ref 50–100)

## 2023-12-11 LAB — HEMOGLOBIN A1C
Hgb A1c MFr Bld: 6.9 % — ABNORMAL HIGH (ref 4.8–5.6)
Mean Plasma Glucose: 151.33 mg/dL

## 2023-12-20 NOTE — Progress Notes (Unsigned)
 Sleep Medicine   Office Visit  Patient Name: John [name]. DOB: 1975-11-29 MRN [id number]    Chief Complaint: sleep apnea   Brief History:  Cline presents for an initial consult for sleep evaluation and to establish care. Patient has a 8 year history of sleep apnea but is not using a PAP at this time. Patient is in need of supplies as well as he has not had supplies in years. Sleep quality is poor. This is noted most nights. The patient's bed partner reports snoring, choking, and drooling in his sleep at night. The patient relates the following symptoms: some fatigue, trouble concentrating, and brain fog are also present. The patient goes to sleep at 0830 pm and wakes up at 0600 am. Patient reports waking up several times in between and has no trouble returning to sleep.  Sleep quality is the same when outside home environment.  Patient has noted significant movement of his legs at night that would disrupt his sleep.  The patient relates no unusual behavior during the night.  The patient reports bipolar disorder, schizophrenia, depression, and anxiety as a history of psychiatric problems. The Epworth Sleepiness Score is 5 out of 24. The patient reports cardiovascular risk factors as following: HTN. The patient reports history of asthma and history of Diabetes.    ROS  General: (-) fever, (-) chills, (-) night sweat Nose and Sinuses: (-) nasal stuffiness or itchiness, (-) postnasal drip, (-) nosebleeds, (-) sinus trouble. Mouth and Throat: (-) sore throat, (-) hoarseness. Neck: (-) swollen glands, (-) enlarged thyroid , (-) neck pain. Respiratory: + cough, - shortness of breath, - wheezing. Neurologic: - numbness, - tingling. Psychiatric: + anxiety, + depression Sleep behavior: -sleep paralysis -hypnogogic hallucinations -dream enactment      -vivid dreams -cataplexy -night terrors -sleep walking   Current Medication: Outpatient Encounter Medications as of 12/23/2023  Medication Sig    ACCU-CHEK GUIDE test strip USE TO CHECK BLOOD SUGAR DAILY   Accu-Chek Softclix Lancets lancets USE TO CHECK BLOOD SUGAR DAILY   Alcohol Swabs (B-D SINGLE USE SWABS REGULAR) PADS USE AS DIRECTED FOR BLOOD SUGAR CHECKS.   allopurinol  (ZYLOPRIM ) 100 MG tablet Take 1 tablet (100 mg total) by mouth daily. (Patient not taking: Reported on 06/13/2022)   Asenapine  Maleate 10 MG SUBL Place 1 tablet under the tongue daily.   atenolol  (TENORMIN ) 100 MG tablet TAKE 1 TABLET BY MOUTH DAILY (Patient taking differently: Take 100 mg by mouth daily.)   atorvastatin  (LIPITOR) 10 MG tablet TAKE 1 TABLET BY MOUTH DAILY (Patient taking differently: Take 10 mg by mouth daily.)   cloZAPine  (CLOZARIL ) 100 MG tablet Take 150 mg by mouth at bedtime.   colchicine  0.6 MG tablet Take 1 tablet (0.6 mg total) by mouth 2 (two) times daily.   divalproex  (DEPAKOTE  ER) 500 MG 24 hr tablet Take 2,000 mg by mouth at bedtime.    doxycycline  (VIBRA -TABS) 100 MG tablet Take 1 tablet (100 mg total) by mouth 2 (two) times daily.   ELIQUIS  5 MG TABS tablet TAKE 1 TABLET BY MOUTH TWICE DAILY (Patient taking differently: Take 5 mg by mouth 2 (two) times daily.)   HYDROcodone -acetaminophen  (NORCO) 5-325 MG tablet Take 1 tablet by mouth every 6 (six) hours as needed for moderate pain.   levothyroxine  (SYNTHROID ) 88 MCG tablet TAKE 1 TABLET BY MOUTH DAILY BEFORE BREAKFAST (Patient taking differently: Take 88 mcg by mouth daily before breakfast.)   loratadine  (CLARITIN ) 10 MG tablet TAKE 1 TABLET BY MOUTH DAILY  metFORMIN  (GLUCOPHAGE ) 1000 MG tablet TAKE 1 TABLET BY MOUTH TWICE A DAY WITH FOOD (Patient taking differently: Take 1,000 mg by mouth 2 (two) times daily with a meal.)   omeprazole  (PRILOSEC) 20 MG capsule TAKE 1 CAPSULE BY MOUTH DAILY (Patient taking differently: Take 20 mg by mouth daily.)   oxybutynin  (DITROPAN ) 5 MG tablet TAKE 1 TABLET BY MOUTH DAILY (Patient taking differently: Take 5 mg by mouth daily.)   predniSONE  (STERAPRED  UNI-PAK 21 TAB) 10 MG (21) TBPK tablet Take 6 pills on day one then decrease by 1 pill each day (Patient not taking: Reported on 06/13/2022)   [DISCONTINUED] albuterol  (PROVENTIL ) (2.5 MG/3ML) 0.083% nebulizer solution USE 1 VIAL VIA NEBULIZER 3 TIMES A DAY   No facility-administered encounter medications on file as of 12/23/2023.    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 tophus 07/20/2019   Morbid obesity ([name])    Paranoid schizophrenia ([name])    Personality disorder ([name])    Sleep apnea    Thyroid  disease     Family History: Non contributory to the present illness  Social History: Social History   Socioeconomic History   Marital status: Single    Spouse name: Not on file   Number of children: Not on file   Years of education: Not on file   Highest education level: Not on file  Occupational History   Not on file  Tobacco Use   Smoking status: Former   Smokeless tobacco: Never  Vaping Use   Vaping status: Never Used  Substance and Sexual Activity   Alcohol use: Not Currently   Drug use: No   Sexual activity: Not on file  Other Topics Concern   Not on file  Social History Narrative   Not on file   Social Drivers of Health   Tobacco Use: Medium Risk (12/23/2023)   Patient History    Smoking Tobacco Use: Former    Smokeless Tobacco Use: Never    Passive Exposure: Not on Actuary Strain: Not on file  Food Insecurity: Not on file  Transportation Needs: Not on file  Physical Activity: Not on file  Stress: Not on file  Social Connections: Not on file   Intimate Partner Violence: Not on file  Depression (EYV7-0): Not on file  Alcohol Screen: Not on file  Housing: Not on file  Utilities: Not on file  Health Literacy: Not on file    Vital Signs: Blood pressure 114/85, pulse 88, resp. rate 16, height 6' 1 (1.854 m), weight 285 lb (129.3 kg), SpO2 98%. Body mass index is 37.6 kg/m.   Examination: General Appearance: The patient is well-developed, well-nourished, and in no distress. Neck Circumference: 44 cm Skin: Gross inspection of skin unremarkable. Head: normocephalic, no gross deformities. Eyes: no gross deformities noted. ENT: ears appear grossly normal Neurologic: Alert and oriented. No involuntary movements.    STOP BANG RISK ASSESSMENT S (snore) Have you been told that you snore?     YES   T (tired) Are you often tired, fatigued, or sleepy during the day?  YES  O (obstruction) Do you stop breathing, choke, or gasp during sleep? YES   P (pressure) Do you have or are you being treated for high blood pressure? YES   B (BMI) Is your body index greater than 35 kg/m? YES   A (age) Are you 53 years old or older? NO   N (neck) Do you have a neck circumference greater than 16 inches?   YES   G (gender) Are you a male? YES   TOTAL STOP/BANG YES ANSWERS 7                                                               A STOP-Bang score of 2 or less is considered low risk, and a score of 5 or more is high risk for having either moderate or severe OSA. For people who score 3 or 4, doctors may need to perform further assessment to determine how likely they are to have OSA.         EPWORTH SLEEPINESS SCALE:  Scale:  (0)= no chance of dozing; (1)= slight chance of dozing; (2)= moderate chance of dozing; (3)= high chance of dozing  Chance  Situtation    Sitting and reading: 0    Watching TV: 3    Sitting Inactive in public: 0    As a passenger in car: 0      Lying down to rest: 2    Sitting and talking: 1     Sitting quielty after lunch: 2    In a car, stopped in traffic: 0   TOTAL SCORE:   5 out of 24    SLEEP STUDIES:  Split 11/2015 - AHI 69, low SpO2 84%,    LABS: Recent Results (from the past 2160 hours)  Valproic acid  level     Status: None   Collection Time: 10/03/23  3:36 PM  Result Value Ref Range   Valproic Acid  Lvl 65 50 - 100 ug/mL    Comment: Performed at [facility], [address] [place], [place] [address]  Comprehensive metabolic panel     Status: Abnormal   Collection Time: 10/03/23  3:36 PM  Result Value Ref Range   Sodium 136 135 - 145 mmol/L   Potassium 3.9 3.5 - 5.1 mmol/L   Chloride 98 98 - 111 mmol/L   CO2 27 22 - 32 mmol/L   Glucose, Bld 172 (H) 70 - 99 mg/dL    Comment: Glucose reference range applies only to samples taken after fasting for at least 8 hours.   BUN 7 6 - 20 mg/dL   Creatinine, Ser 9.12 0.61 - 1.24 mg/dL   Calcium  8.9 8.9 - 10.3 mg/dL   Total Protein 8.5 (H) 6.5 - 8.1 g/dL   Albumin 3.6 3.5 - 5.0 g/dL   AST 33 15 - 41 U/L   ALT 41 0 - 44 U/L   Alkaline Phosphatase 54 38 - 126 U/L   Total Bilirubin 0.3 0.0 - 1.2 mg/dL   GFR, Estimated >39 >39 mL/min    Comment: (NOTE) Calculated using the CKD-EPI Creatinine Equation (2021)    Anion gap 11 5 - 15    Comment: Performed at [facility], [address], [place], [place] [address]  Basic metabolic panel  Status: Abnormal   Collection Time: 10/27/23  6:22 PM  Result Value Ref Range   Sodium 136 135 - 145 mmol/L   Potassium 4.0 3.5 - 5.1 mmol/L   Chloride 101 98 - 111 mmol/L   CO2 26 22 - 32 mmol/L   Glucose, Bld 110 (H) 70 - 99 mg/dL    Comment: Glucose reference range applies only to samples taken after fasting for at least 8 hours.   BUN 10 6 - 20 mg/dL   Creatinine, Ser 9.38 0.61 - 1.24 mg/dL   Calcium  8.6 (L) 8.9 - 10.3 mg/dL   GFR, Estimated >39 >39 mL/min    Comment: (NOTE) Calculated using the CKD-EPI Creatinine Equation (2021)    Anion gap 9 5  - 15    Comment: Performed at [facility], [address] [place], [place] [address]  CBC with Differential     Status: Abnormal   Collection Time: 10/27/23  6:22 PM  Result Value Ref Range   WBC 6.0 4.0 - 10.5 K/uL   RBC 4.66 4.22 - 5.81 MIL/uL   Hemoglobin 12.1 (L) 13.0 - 17.0 g/dL   HCT 60.9 60.9 - 47.9 %   MCV 83.7 80.0 - 100.0 fL   MCH 26.0 26.0 - 34.0 pg   MCHC 31.0 30.0 - 36.0 g/dL   RDW 85.9 88.4 - 84.4 %   Platelets 218 150 - 400 K/uL   nRBC 0.0 0.0 - 0.2 %   Neutrophils Relative % 54 %   Neutro Abs 3.3 1.7 - 7.7 K/uL   Lymphocytes Relative 34 %   Lymphs Abs 2.0 0.7 - 4.0 K/uL   Monocytes Relative 9 %   Monocytes Absolute 0.5 0.1 - 1.0 K/uL   Eosinophils Relative 1 %   Eosinophils Absolute 0.1 0.0 - 0.5 K/uL   Basophils Relative 1 %   Basophils Absolute 0.0 0.0 - 0.1 K/uL   Immature Granulocytes 1 %   Abs Immature Granulocytes 0.04 0.00 - 0.07 K/uL    Comment: Performed at [facility], [address] [place], [place] [address]  Valproic acid  level     Status: None   Collection Time: 11/07/23 11:50 AM  Result Value Ref Range   Valproic Acid  Lvl 87 50 - 100 ug/mL    Comment: Performed at [facility], [address] [place], [place] [address]  Comprehensive metabolic panel     Status: Abnormal   Collection Time: 11/07/23 11:50 AM  Result Value Ref Range   Sodium 136 135 - 145 mmol/L   Potassium 3.7 3.5 - 5.1 mmol/L   Chloride 97 (L) 98 - 111 mmol/L   CO2 27 22 - 32 mmol/L   Glucose, Bld 145 (H) 70 - 99 mg/dL    Comment: Glucose reference range applies only to samples taken after fasting for at least 8 hours.   BUN 7 6 - 20 mg/dL   Creatinine, Ser 9.19 0.61 - 1.24 mg/dL   Calcium  8.4 (L) 8.9 - 10.3 mg/dL   Total Protein 8.2 (H) 6.5 - 8.1 g/dL   Albumin 3.3 (L) 3.5 - 5.0 g/dL   AST 33 15 - 41 U/L   ALT 31 0 - 44 U/L   Alkaline Phosphatase 49 38 - 126 U/L   Total Bilirubin 0.6 0.0 - 1.2 mg/dL   GFR, Estimated >39 >39 mL/min     Comment: (NOTE) Calculated using the CKD-EPI Creatinine Equation (2021)    Anion gap 12 5 - 15  Comment: Performed at [facility], [address] [place], [place] [address]  CBC with Differential/Platelet     Status: Abnormal   Collection Time: 11/07/23 11:50 AM  Result Value Ref Range   WBC 6.2 4.0 - 10.5 K/uL   RBC 4.40 4.22 - 5.81 MIL/uL   Hemoglobin 11.4 (L) 13.0 - 17.0 g/dL   HCT 62.6 (L) 60.9 - 47.9 %   MCV 84.8 80.0 - 100.0 fL   MCH 25.9 (L) 26.0 - 34.0 pg   MCHC 30.6 30.0 - 36.0 g/dL   RDW 85.5 88.4 - 84.4 %   Platelets 196 150 - 400 K/uL   nRBC 0.0 0.0 - 0.2 %   Neutrophils Relative % 55 %   Neutro Abs 3.5 1.7 - 7.7 K/uL   Lymphocytes Relative 34 %   Lymphs Abs 2.1 0.7 - 4.0 K/uL   Monocytes Relative 7 %   Monocytes Absolute 0.4 0.1 - 1.0 K/uL   Eosinophils Relative 2 %   Eosinophils Absolute 0.1 0.0 - 0.5 K/uL   Basophils Relative 1 %   Basophils Absolute 0.0 0.0 - 0.1 K/uL   Immature Granulocytes 1 %   Abs Immature Granulocytes 0.03 0.00 - 0.07 K/uL    Comment: Performed at [facility], [address] [place], [place] [address]  Valproic acid  level     Status: None   Collection Time: 12/10/23  4:07 PM  Result Value Ref Range   Valproic Acid  Lvl 78 50 - 100 ug/mL    Comment: Performed at [facility], [address] [place], [place] [address]  Comprehensive metabolic panel     Status: Abnormal   Collection Time: 12/10/23  4:07 PM  Result Value Ref Range   Sodium 138 135 - 145 mmol/L   Potassium 4.8 3.5 - 5.1 mmol/L   Chloride 98 98 - 111 mmol/L   CO2 30 22 - 32 mmol/L   Glucose, Bld 139 (H) 70 - 99 mg/dL    Comment: Glucose reference range applies only to samples taken after fasting for at least 8 hours.   BUN 11 6 - 20 mg/dL   Creatinine, Ser 9.22 0.61 - 1.24 mg/dL   Calcium  9.1 8.9 - 10.3 mg/dL   Total Protein 8.2 (H) 6.5 - 8.1 g/dL   Albumin 4.0 3.5 - 5.0 g/dL   AST 31 15 - 41 U/L   ALT 28 0 - 44 U/L   Alkaline  Phosphatase 55 38 - 126 U/L   Total Bilirubin 0.3 0.0 - 1.2 mg/dL   GFR, Estimated >39 >39 mL/min    Comment: (NOTE) Calculated using the CKD-EPI Creatinine Equation (2021)    Anion gap 10 5 - 15    Comment: Performed at [facility], [address] [place], [place] [address]  CBC with Differential/Platelet     Status: Abnormal   Collection Time: 12/10/23  4:07 PM  Result Value Ref Range   WBC 7.3 4.0 - 10.5 K/uL   RBC 4.86 4.22 - 5.81 MIL/uL   Hemoglobin 12.6 (L) 13.0 - 17.0 g/dL   HCT 58.2 60.9 - 47.9 %   MCV 85.8 80.0 - 100.0 fL   MCH 25.9 (L) 26.0 - 34.0 pg   MCHC 30.2 30.0 - 36.0 g/dL   RDW 85.5 88.4 - 84.4 %   Platelets 233 150 - 400 K/uL   nRBC 0.0 0.0 - 0.2 %   Neutrophils Relative % 62 %   Neutro Abs 4.6 1.7 -  7.7 K/uL   Lymphocytes Relative 30 %   Lymphs Abs 2.2 0.7 - 4.0 K/uL   Monocytes Relative 6 %   Monocytes Absolute 0.4 0.1 - 1.0 K/uL   Eosinophils Relative 2 %   Eosinophils Absolute 0.1 0.0 - 0.5 K/uL   Basophils Relative 0 %   Basophils Absolute 0.0 0.0 - 0.1 K/uL   Immature Granulocytes 0 %   Abs Immature Granulocytes 0.03 0.00 - 0.07 K/uL    Comment: Performed at [facility], [address] [place], [place] [address]  Hemoglobin A1c     Status: Abnormal   Collection Time: 12/10/23  4:07 PM  Result Value Ref Range   Hgb A1c MFr Bld 6.9 (H) 4.8 - 5.6 %    Comment: (NOTE) Diagnosis of Diabetes The following HbA1c ranges recommended by the American Diabetes Association (ADA) may be used as an aid in the diagnosis of diabetes mellitus.  Hemoglobin             Suggested A1C NGSP%              Diagnosis  <5.7                   Non Diabetic  5.7-6.4                Pre-Diabetic  >6.4                   Diabetic  <7.0                   Glycemic control for                       adults with diabetes.     Mean Plasma Glucose 151.33 mg/dL    Comment: Performed at [facility], [address], [place], [place] [address]  TSH      Status: None   Collection Time: 12/10/23  4:07 PM  Result Value Ref Range   TSH 3.870 0.350 - 4.500 uIU/mL    Comment: Performed at [facility], [address] [place], [place] [address]  T4, free     Status: None   Collection Time: 12/10/23  4:07 PM  Result Value Ref Range   Free T4 1.04 0.61 - 1.12 ng/dL    Comment: Performed at [facility], [address] [place], [place] [address]  Resp panel by RT-PCR (RSV, Flu A&B, Covid) Anterior Nasal Swab     Status: None   Collection Time: 12/21/23 12:19 PM   Specimen: Anterior Nasal Swab  Result Value Ref Range   SARS Coronavirus 2 by RT PCR NEGATIVE NEGATIVE    Comment: (NOTE) SARS-CoV-2 target nucleic acids are NOT DETECTED.  The SARS-CoV-2 RNA is generally detectable in upper respiratory specimens during the acute phase of infection. The lowest concentration of SARS-CoV-2 viral copies this assay can detect is 138 copies/mL. A negative result does not preclude SARS-Cov-2 infection and should not be used as the sole basis for treatment or other patient management decisions. A negative result may occur with  improper specimen collection/handling, submission of specimen other than nasopharyngeal swab, presence of viral mutation(s) within the areas targeted by this assay, and inadequate number of viral copies(<138 copies/mL). A negative result must be combined with clinical observations, patient history, and epidemiological information. The expected result is Negative.  Fact Sheet for Patients:  bloggercourse.com  Fact Sheet for Healthcare Providers:  seriousbroker.it  This test is no t yet approved or cleared by the [place]  FDA and  has been authorized for detection and/or diagnosis of SARS-CoV-2 by FDA under an Emergency Use Authorization (EUA). This EUA will remain  in effect (meaning this test can be used) for the duration of the COVID-19  declaration under Section 564(b)(1) of the Act, 21 U.S.C.section 360bbb-3(b)(1), unless the authorization is terminated  or revoked sooner.       Influenza A by PCR NEGATIVE NEGATIVE   Influenza B by PCR NEGATIVE NEGATIVE    Comment: (NOTE) The Xpert Xpress SARS-CoV-2/FLU/RSV plus assay is intended as an aid in the diagnosis of influenza from Nasopharyngeal swab specimens and should not be used as a sole basis for treatment. Nasal washings and aspirates are unacceptable for Xpert Xpress SARS-CoV-2/FLU/RSV testing.  Fact Sheet for Patients: bloggercourse.com  Fact Sheet for Healthcare Providers: seriousbroker.it  This test is not yet approved or cleared by the [place]  FDA and has been authorized for detection and/or diagnosis of SARS-CoV-2 by FDA under an Emergency Use Authorization (EUA). This EUA will remain in effect (meaning this test can be used) for the duration of the COVID-19 declaration under Section 564(b)(1) of the Act, 21 U.S.C. section 360bbb-3(b)(1), unless the authorization is terminated or revoked.     Resp Syncytial Virus by PCR NEGATIVE NEGATIVE    Comment: (NOTE) Fact Sheet for Patients: bloggercourse.com  Fact Sheet for Healthcare Providers: seriousbroker.it  This test is not yet approved or cleared by the [place]  FDA and has been authorized for detection and/or diagnosis of SARS-CoV-2 by FDA under an Emergency Use Authorization (EUA). This EUA will remain in effect (meaning this test can be used) for the duration of the COVID-19 declaration under Section 564(b)(1) of the Act, 21 U.S.C. section 360bbb-3(b)(1), unless the authorization is terminated or revoked.  Performed at [facility], [address] [place], [place] [address]     Radiology: No results found.  No results found.  No results found.    Assessment and  Plan: Patient Active Problem List   Diagnosis Date Noted   Wrist injury, right, initial encounter 01/09/2022   Acute deep vein thrombosis (DVT) of left upper extremity ([name]) 06/06/2021   SIRS of non-infectious origin w acute organ dysfunction ([name]) 06/06/2021   SIRS (systemic inflammatory response syndrome) ([name]) 06/06/2021   Gout    Hyperlipidemia    Community acquired pneumonia 08/29/2020   Bronchitis 08/23/2020   Idiopathic sleep related nonobstructive alveolar hypoventilation 08/23/2020   Sore throat 08/23/2020   OSA treated with BiPAP 08/08/2020   Obesity (BMI 30-39.9) 08/08/2020   OSA on CPAP 05/23/2020   CPAP use counseling 05/23/2020   BMI 36.0-36.9,adult 05/23/2020   Bilateral leg weakness 08/17/2019   Knee arthropathy 08/10/2019   Acute gout due to renal impairment involving right foot 07/08/2019   Encephalopathy acute    Somnolence 11/01/2016   AKI (acute kidney injury) 11/01/2016   Sepsis, possible([name]) 04/27/2016   Tobacco abuse 11/28/2015   Sleep apnea    Essential hypertension 11/25/2015   Hypothyroidism 11/25/2015   Paranoid schizophrenia, chronic condition ([name]) 11/25/2015   Diabetes mellitus ([name])    Diabetes mellitus due to underlying condition without complication, without long-term current use of insulin  ([name])    1. OSA (obstructive sleep apnea) (Primary) PLAN OSA:   Patient evaluation suggests high risk of sleep disordered breathing due to history of OSA, not currently using bipap.  Patient has comorbid cardiovascular risk factors including: hypertension  which could be exacerbated by pathologic sleep-disordered breathing.  Suggest: Split study  to assess/treat the patient's sleep disordered breathing. Since he hasn't used the machine in 3 years I think it is wise to reassess. The patient was also counselled on weight loss to optimize sleep health.  2. Essential hypertension Controlled with atenolol , hydrochlorothiazide . Continue.    General  Counseling: I have discussed the findings of the evaluation and examination with Christopher.  I have also discussed any further diagnostic evaluation thatmay be needed or ordered today. Peniel verbalizes understanding of the findings of todays visit. We also reviewed his medications today and discussed drug interactions and side effects including but not limited excessive drowsiness and altered mental states. We also discussed that there is always a risk not just to him but also people around him. he has been encouraged to call the office with any questions or concerns that should arise related to todays visit.  No orders of the defined types were placed in this encounter.       I have personally obtained a history, evaluated the patient, evaluated pertinent data, formulated the assessment and plan and placed orders.   This patient was seen today by Lauraine Lay, PA-C in collaboration with Dr. Elfreda Bathe.   Elfreda DELENA Bathe, MD [facility] Diplomate ABMS Pulmonary and Critical Care Medicine Sleep medicine

## 2023-12-21 ENCOUNTER — Other Ambulatory Visit: Payer: Self-pay

## 2023-12-21 ENCOUNTER — Emergency Department
Admission: EM | Admit: 2023-12-21 | Discharge: 2023-12-21 | Disposition: A | Discharge status: Home / Self Care | Attending: Emergency Medicine | Admitting: Emergency Medicine

## 2023-12-21 DIAGNOSIS — M79671 Pain in right foot: Secondary | ICD-10-CM | POA: Diagnosis not present

## 2023-12-21 DIAGNOSIS — R051 Acute cough: Secondary | ICD-10-CM | POA: Diagnosis not present

## 2023-12-21 DIAGNOSIS — M79672 Pain in left foot: Secondary | ICD-10-CM | POA: Diagnosis not present

## 2023-12-21 DIAGNOSIS — R0781 Pleurodynia: Secondary | ICD-10-CM

## 2023-12-21 DIAGNOSIS — J3489 Other specified disorders of nose and nasal sinuses: Secondary | ICD-10-CM | POA: Diagnosis not present

## 2023-12-21 DIAGNOSIS — R0981 Nasal congestion: Secondary | ICD-10-CM | POA: Insufficient documentation

## 2023-12-21 DIAGNOSIS — E119 Type 2 diabetes mellitus without complications: Secondary | ICD-10-CM | POA: Diagnosis not present

## 2023-12-21 DIAGNOSIS — R0789 Other chest pain: Secondary | ICD-10-CM | POA: Insufficient documentation

## 2023-12-21 DIAGNOSIS — I10 Essential (primary) hypertension: Secondary | ICD-10-CM | POA: Insufficient documentation

## 2023-12-21 DIAGNOSIS — R062 Wheezing: Secondary | ICD-10-CM | POA: Insufficient documentation

## 2023-12-21 DIAGNOSIS — R600 Localized edema: Secondary | ICD-10-CM | POA: Diagnosis present

## 2023-12-21 DIAGNOSIS — R071 Chest pain on breathing: Secondary | ICD-10-CM | POA: Diagnosis not present

## 2023-12-21 LAB — RESP PANEL BY RT-PCR (RSV, FLU A&B, COVID)  RVPGX2
Influenza A by PCR: NEGATIVE
Influenza B by PCR: NEGATIVE
Resp Syncytial Virus by PCR: NEGATIVE
SARS Coronavirus 2 by RT PCR: NEGATIVE

## 2023-12-21 MED ORDER — ALBUTEROL SULFATE HFA 108 (90 BASE) MCG/ACT IN AERS: 2.0000 | INHALATION_SPRAY | Freq: Four times a day (QID) | RESPIRATORY_TRACT | 2 refills | Status: AC | PRN

## 2023-12-21 MED ORDER — AEROCHAMBER MV MISC: 0 refills | Status: AC

## 2023-12-21 MED ORDER — HYDROCHLOROTHIAZIDE 12.5 MG PO CAPS: 12.5000 mg | ORAL_CAPSULE | Freq: Every day | ORAL | 0 refills | Status: DC

## 2023-12-21 NOTE — ED Triage Notes (Signed)
 First nurse note: Pt to ED via [facility] from [facility] [address] [place]. Pt reports bilateral foot pain since Thursday. No injuries. Reports hurts to walk. Hx of DM. CBG 160

## 2023-12-21 NOTE — ED Triage Notes (Signed)
 Pt to ED via [facility] from [facility] [address] [place]   for bilateral foot pain since last Tuesday. No known injuries. States painful to walk. No obvious wounds. States he also has a cough. Respirations are unlabored.

## 2023-12-21 NOTE — ED Provider Notes (Signed)
 "  [facility] Provider Note   Event Date/Time   First MD Initiated Contact with Patient 12/21/23 1352     (approximate) History  Foot Pain and Cough  HPI John [name]. is a 48 y.o. male with a past medical history of type 2 diabetes, hypertension, thyroid  disease, morbid obesity, paranoid schizophrenia, and idiopathic gout of the right foot who presents complaining of bilateral lower extremity edema with tightness and cough with rhinorrhea that is been present over the last 48 hours.  Patient states that his bilateral foot pain has been present intermittently over the last few months and was told that he may need diuresis.  Patient is also complaining of of runny nose, cough, and right-sided chest pain that is worse with taking a deep breath or coughing.  Patient states he lives in a group home and has multiple sick contacts.  Patient denies any recent travel or food out of the ordinary ROS: Patient currently denies any vision changes, tinnitus, difficulty speaking, facial droop, shortness of breath, abdominal pain, nausea/vomiting/diarrhea, dysuria, or weakness/numbness/paresthesias in any extremity   Physical Exam  Triage Vital Signs: ED Triage Vitals  Encounter Vitals Group     BP 12/21/23 1216 132/79     Girls Systolic BP Percentile --      Girls Diastolic BP Percentile --      Boys Systolic BP Percentile --      Boys Diastolic BP Percentile --      Pulse Rate 12/21/23 1216 98     Resp 12/21/23 1216 20     Temp 12/21/23 1216 (!) 97.4 F (36.3 C)     Temp Source 12/21/23 1216 Oral     SpO2 12/21/23 1216 95 %     Weight 12/21/23 1219 290 lb (131.5 kg)     Height 12/21/23 1219 6' (1.829 m)     Head Circumference --      Peak Flow --      Pain Score 12/21/23 1217 8     Pain Loc --      Pain Education --      Exclude from Growth Chart --    Most recent vital signs: Vitals:   12/21/23 1216  BP: 132/79  Pulse: 98  Resp: 20  Temp: (!) 97.4 F (36.3  C)  SpO2: 95%   General: Awake, oriented x4. CV:  Good peripheral perfusion. Resp:  Normal effort.  Mild end expiratory wheezing Abd:  No distention. Other:  Obese middle-aged African-American male resting comfortably in no acute distress.  1+ pitting edema to bilateral lower extremities to the mid calf ED Results / Procedures / Treatments  Labs (all labs ordered are listed, but only abnormal results are displayed) Labs Reviewed  RESP PANEL BY RT-PCR (RSV, FLU A&B, COVID)  [id number]   PROCEDURES: Critical Care performed: No Procedures MEDICATIONS ORDERED IN ED: Medications - No data to display IMPRESSION / MDM / ASSESSMENT AND PLAN / ED COURSE  I reviewed the triage vital signs and the nursing notes.                             The patient is on the cardiac monitor to evaluate for evidence of arrhythmia and/or significant heart rate changes. Patient's presentation is most consistent with acute presentation with potential threat to life or bodily function. Patient is a 48 year old male presents for signs and symptoms of upper respiratory infection.  Patient's COVID/flu/RSV  are negative.  Patient does have mild expiratory wheezing over bilateral lung fields and will provide patient with outpatient prescription for albuterol .  Patient also has mild swelling to bilateral lower extremities without any significant respiratory distress.  Will provide a short course of low-dose hydrochlorothiazide  to help with any overlying edema.  Patient was encouraged to follow-up with his primary care physician for further evaluation and management of his chronic foot pain.  Patient shows no red flag symptomatology at this time and is stable for discharge.  Patient agrees with plan for discharge at this time with outpatient PCP follow-up as needed.  Patient given strict return precautions and all questions answered prior to discharge  Dispo: Discharge home with PCP follow-up as needed   FINAL CLINICAL  IMPRESSION(S) / ED DIAGNOSES   Final diagnoses:  Bilateral lower extremity edema  Bilateral foot pain  Acute cough  Right-sided chest wall pain  Pleuritic pain  Wheezing on expiration   Rx / DC Orders   ED Discharge Orders          Ordered    hydrochlorothiazide  (MICROZIDE ) 12.5 MG capsule  Daily        12/21/23 1418    albuterol  (VENTOLIN  HFA) 108 (90 Base) MCG/ACT inhaler  Every 6 hours PRN        12/21/23 1418    Spacer/Aero-Holding Chambers (AEROCHAMBER MV) inhaler        12/21/23 1418           Note:  This document was prepared using Dragon voice recognition software and may include unintentional dictation errors.   Brigitta Pricer K, MD 12/21/23 1515  "

## 2023-12-23 ENCOUNTER — Ambulatory Visit: Admitting: Internal Medicine

## 2023-12-23 VITALS — BP 114/85 | HR 88 | Resp 16 | Ht 73.0 in | Wt 285.0 lb

## 2023-12-23 DIAGNOSIS — I10 Essential (primary) hypertension: Secondary | ICD-10-CM | POA: Diagnosis not present

## 2023-12-23 DIAGNOSIS — G4733 Obstructive sleep apnea (adult) (pediatric): Secondary | ICD-10-CM | POA: Diagnosis not present

## 2023-12-23 NOTE — Patient Instructions (Signed)
 Living With Sleep Apnea Sleep apnea is a condition that affects your breathing while you're sleeping. Your tongue or the tissue in your throat may block the flow of air while you sleep. You may have shallow breathing or stop breathing for short periods of time. The breaks in breathing interrupt the deep sleep that you need to feel rested. Even if you don't wake up from the gaps in breathing, you may feel tired during the day. People with sleep apnea may snore loudly. You may have a headache in the morning and feel anxious or depressed. How can sleep apnea affect me? Sleep apnea increases your chances of being very tired during the day. This is called daytime fatigue. Sleep apnea can also increase your risk of: Heart attack. Stroke. Obesity. Type 2 diabetes. Heart failure. Irregular heartbeat. High blood pressure. If you are very tired during the day, you may be more likely to: Not do well in school or at work. Fall asleep while driving. Have trouble paying attention. Develop depression or anxiety. Have problems having sex. This is called sexual dysfunction. What actions can I take to manage sleep apnea? Sleep apnea treatment  If you were given a device to open your airway while you sleep, use it only as told by your health care provider. You may be given: An oral appliance. This is a mouthpiece that shifts your lower jaw forward. A continuous positive airway pressure (CPAP) device. This blows air through a mask. A nasal expiratory positive airway pressure (EPAP) device. This has valves that you put into each nostril. A bi-level positive airway pressure (BIPAP) device. This blows air through a mask when you breathe in and breathe out. You may need surgery if other treatments don't work for you. Sleep habits Go to sleep and wake up at the same time every day. This helps set your internal clock for sleeping. If you stay up later than usual on weekends, try to get up in the morning within 2  hours of the time you usually wake up. Try to get at least 7-9 hours of sleep each night. Stop using a computer, tablet, and mobile phone a few hours before bedtime. Do not take long naps during the day. If you nap, limit it to 30 minutes. Have a relaxing bedtime routine. Reading or listening to music may relax you and help you sleep. Use your bedroom only for sleep. Keep your television and computer out of your bedroom. Keep your bedroom cool, dark, and quiet. Use a supportive mattress and pillows. Follow your provider's instructions for other changes to sleep habits. Nutrition Do not eat big meals in the evening. Do not have caffeine in the later part of the day. The effects of caffeine can last for more than 5 hours. Follow your provider's instructions for any changes to what you eat and drink. Lifestyle Do not drink alcohol before bedtime. Alcohol can cause you to fall asleep at first, but then it can cause you to wake up in the middle of the night and have trouble getting back to sleep. Do not smoke, vape, or use nicotine or tobacco. Medicines Take over-the-counter and prescription medicines only as told by your provider. Do not use over-the-counter sleep medicine. You may become dependent on this medicine, and it can make sleep apnea worse. Do not take medicines, such as sedatives and narcotics, unless told to by your provider. Activity Exercise on most days, but avoid exercising in the evening. Exercising near bedtime can interfere with sleeping.  If possible, spend time outside every day. Natural light helps with your internal clock. General information Lose weight if you need to. Stay at a healthy weight. If you are having surgery, make sure to tell your provider that you have sleep apnea. You may need to bring your device with you. Keep all follow-up visits. Your provider will want to check on your condition. Where to find more information National Heart, Lung, and Blood  Institute: [employer] This information is not intended to replace advice given to you by your health care provider. Make sure you discuss any questions you have with your health care provider. Document Revised: 04/11/2022 Document Reviewed: 04/11/2022 Elsevier Patient Education  2024 [employer].

## 2024-01-06 NOTE — Progress Notes (Signed)
" °  Cardiology Office Note   Date:  01/07/2024  ID:  John JINNY Milissa Mickey., DOB 1975/08/01, MRN [id number] PCP: [facility] Providers Cardiologist:  Caron Poser, MD     History of Present Illness John Binsfeld. is a 49 y.o. male PMH obesity, HTN, HLD, schizophrenia, who presents for further evaluation and management of sinus tachycardia.  Seen by PCP for this issue 12/18/2023.  Recent labs show unremarkable CBC, CMP, TSH, free T4.  Reportedly on Eliquis  for unclear reasons.  Review of prior venous studies showed a non-occlusive superficial phlebitis cephalic vein left 06/2021.  Last LDL was 94 11/2013.  Patient reports he is overall feeling well today.  He has no cardiac complaints.  He denies any cardiovascular symptoms.  Relevant CVD History -TTE 09/2017 LVEF 50 to 55%, mild LVH, mild PR -TTE 11/2016 LVEF 50%, grade 1 diastolic dysfunction, mild MR with mild to moderate LV chamber dilation   ROS: Pt denies any chest discomfort, jaw pain, arm pain, palpitations, syncope, presyncope, orthopnea, PND, or LE edema.  Studies Reviewed I have independently reviewed the patient's ECG, previous cardiac studies, previous medical records, previous blood work.  Physical Exam VS:  BP 90/62 (BP Location: Right Arm, Patient Position: Sitting, Cuff Size: Large)   Pulse 83   Ht 6' (1.829 m)   Wt 277 lb (125.6 kg)   SpO2 97%   BMI 37.57 kg/m        Wt Readings from Last 3 Encounters:  01/07/24 277 lb (125.6 kg)  12/23/23 285 lb (129.3 kg)  12/21/23 290 lb (131.5 kg)    GEN: No acute distress. NECK: No JVD; No carotid bruits. CARDIAC: RRR, no murmurs, rubs, gallops. RESPIRATORY:  Clear to auscultation. EXTREMITIES:  Warm and well-perfused. No edema.  ASSESSMENT AND PLAN Sinus tachycardia Patient referred for further management of sinus tachycardia.  He is not tachycardic today.  ECG is completely normal.  Sinus tachycardia is most commonly caused by a  noncardiac underlying cause, so whatever was provoking this response at time of referral seems to have resolved.  Recent labs were unremarkable.  He denies any ongoing cardiac symptoms.  Plan: - No further cardiac evaluation indicated  History of superficial phlebitis Patient was started on Eliquis  in 2023 for nonocclusive superficial phlebitis of the left cephalic vein.  He has unfortunately been continued on Eliquis  indefinitely since then.  This was a relatively soft indication for anticoagulation to begin with, and certainly does not warrant indefinite therapy.  I recommend we stop Eliquis  since he has no ongoing indications for this medication.   HTN Somewhat low in the office today.  Recommend stopping HCTZ.       Dispo: RTC as needed  Signed, Caron Poser, MD  "

## 2024-01-07 ENCOUNTER — Other Ambulatory Visit
Admission: RE | Admit: 2024-01-07 | Discharge: 2024-01-07 | Disposition: A | Discharge status: Home / Self Care | Source: Ambulatory Visit | Attending: Nurse Practitioner | Admitting: Nurse Practitioner

## 2024-01-07 ENCOUNTER — Ambulatory Visit

## 2024-01-07 VITALS — BP 90/62 | HR 83 | Ht 72.0 in | Wt 277.0 lb

## 2024-01-07 DIAGNOSIS — Z79899 Other long term (current) drug therapy: Secondary | ICD-10-CM | POA: Insufficient documentation

## 2024-01-07 DIAGNOSIS — F209 Schizophrenia, unspecified: Secondary | ICD-10-CM | POA: Insufficient documentation

## 2024-01-07 DIAGNOSIS — R000 Tachycardia, unspecified: Secondary | ICD-10-CM

## 2024-01-07 DIAGNOSIS — E782 Mixed hyperlipidemia: Secondary | ICD-10-CM | POA: Diagnosis not present

## 2024-01-07 LAB — COMPREHENSIVE METABOLIC PANEL WITH GFR
ALT: 28 U/L (ref 0–44)
AST: 31 U/L (ref 15–41)
Albumin: 4 g/dL (ref 3.5–5.0)
Alkaline Phosphatase: 59 U/L (ref 38–126)
Anion gap: 13 (ref 5–15)
BUN: 11 mg/dL (ref 6–20)
CO2: 27 mmol/L (ref 22–32)
Calcium: 9.2 mg/dL (ref 8.9–10.3)
Chloride: 98 mmol/L (ref 98–111)
Creatinine, Ser: 1.16 mg/dL (ref 0.61–1.24)
GFR, Estimated: >60 mL/min
Glucose, Bld: 118 mg/dL — ABNORMAL HIGH (ref 70–99)
Potassium: 4.6 mmol/L (ref 3.5–5.1)
Sodium: 138 mmol/L (ref 135–145)
Total Bilirubin: 0.3 mg/dL (ref 0.0–1.2)
Total Protein: 8.9 g/dL — ABNORMAL HIGH (ref 6.5–8.1)

## 2024-01-07 LAB — CBC WITH DIFFERENTIAL/PLATELET
Abs Immature Granulocytes: 0.06 K/uL (ref 0.00–0.07)
Basophils Absolute: 0 K/uL (ref 0.0–0.1)
Basophils Relative: 0 %
Eosinophils Absolute: 0.2 K/uL (ref 0.0–0.5)
Eosinophils Relative: 3 %
HCT: 39.8 % (ref 39.0–52.0)
Hemoglobin: 12 g/dL — ABNORMAL LOW (ref 13.0–17.0)
Immature Granulocytes: 1 %
Lymphocytes Relative: 36 %
Lymphs Abs: 2.7 K/uL (ref 0.7–4.0)
MCH: 25.6 pg — ABNORMAL LOW (ref 26.0–34.0)
MCHC: 30.2 g/dL (ref 30.0–36.0)
MCV: 84.9 fL (ref 80.0–100.0)
Monocytes Absolute: 0.6 K/uL (ref 0.1–1.0)
Monocytes Relative: 8 %
Neutro Abs: 4 K/uL (ref 1.7–7.7)
Neutrophils Relative %: 52 %
Platelets: 351 K/uL (ref 150–400)
RBC: 4.69 MIL/uL (ref 4.22–5.81)
RDW: 14.3 % (ref 11.5–15.5)
WBC: 7.6 K/uL (ref 4.0–10.5)
nRBC: 0 % (ref 0.0–0.2)

## 2024-01-07 LAB — VALPROIC ACID LEVEL: Valproic Acid Lvl: 80 ug/mL (ref 50–100)

## 2024-01-07 NOTE — Patient Instructions (Signed)
 Medication Instructions:  Your physician recommends the following medication changes.  STOP TAKING: Eliquis  5 mg   Continue taking all other prescribed medications.   *If you need a refill on your cardiac medications before your next appointment, please call your pharmacy*  Lab Work:  No labs ordered today   If you have labs (blood work) drawn today and your tests are completely normal, you will receive your results only by: MyChart Message (if you have MyChart) OR A paper copy in the mail If you have any lab test that is abnormal or we need to change your treatment, we will call you to review the results.  Testing/Procedures:  No test ordered today   Follow-Up: At [facility], you and your health needs are our priority.  As part of our continuing mission to provide you with exceptional heart care, our providers are all part of one team.  This team includes your primary Cardiologist (physician) and Advanced Practice Providers or APPs (Physician Assistants and Nurse Practitioners) who all work together to provide you with the care you need, when you need it.  Your next appointment:   As needed   Provider:   You may see Caron Poser, MD or one of the following Advanced Practice Providers on your designated Care Team:   Lonni Meager, NP Lesley Maffucci, PA-C Bernardino Bring, PA-C Cadence Ayr, PA-C Tylene Lunch, NP Barnie Hila, NP  We recommend signing up for the patient portal called MyChart.  Sign up information is provided on this After Visit Summary.  MyChart is used to connect with patients for Virtual Visits (Telemedicine).  Patients are able to view lab/test results, encounter notes, upcoming appointments, etc.  Non-urgent messages can be sent to your provider as well.   To learn more about what you can do with MyChart, go to [email or web address].

## 2024-01-09 ENCOUNTER — Telehealth: Payer: Self-pay

## 2024-01-09 NOTE — Telephone Encounter (Signed)
 Called back [name] who said patient group home needed documentation of hydrochlorothiazide  being discontinued since it wasn't listed on the AVS from 1/6. She gave me the number to the nurse for the agency the patient's uses so I called and spoke with the nurse and gave verbal order of hydrochlorothiazide  being discontinued along with eliquis . Nurse verbalized understanding. No further needs at this time.

## 2024-01-09 NOTE — Telephone Encounter (Signed)
 Pt requesting c/b about medication management. Not sure if he should discontinue hydrochlorothiazide . Please advise.
